# Patient Record
Sex: Male | Born: 1940
Health system: Southern US, Community
[De-identification: ages and names within clinical notes are randomized; demographics above are authoritative.]

## PROBLEM LIST (undated history)

## (undated) DIAGNOSIS — R011 Cardiac murmur, unspecified: Secondary | ICD-10-CM

## (undated) DIAGNOSIS — I509 Heart failure, unspecified: Secondary | ICD-10-CM

## (undated) DIAGNOSIS — F419 Anxiety disorder, unspecified: Secondary | ICD-10-CM

## (undated) DIAGNOSIS — F32A Depression, unspecified: Secondary | ICD-10-CM

## (undated) DIAGNOSIS — Z8673 Personal history of transient ischemic attack (TIA), and cerebral infarction without residual deficits: Secondary | ICD-10-CM

## (undated) DIAGNOSIS — I480 Paroxysmal atrial fibrillation: Secondary | ICD-10-CM

## (undated) DIAGNOSIS — J449 Chronic obstructive pulmonary disease, unspecified: Secondary | ICD-10-CM

## (undated) DIAGNOSIS — E039 Hypothyroidism, unspecified: Secondary | ICD-10-CM

## (undated) DIAGNOSIS — M1990 Unspecified osteoarthritis, unspecified site: Secondary | ICD-10-CM

## (undated) DIAGNOSIS — N189 Chronic kidney disease, unspecified: Secondary | ICD-10-CM

## (undated) DIAGNOSIS — I2510 Atherosclerotic heart disease of native coronary artery without angina pectoris: Secondary | ICD-10-CM

## (undated) DIAGNOSIS — G4733 Obstructive sleep apnea (adult) (pediatric): Secondary | ICD-10-CM

## (undated) DIAGNOSIS — F329 Major depressive disorder, single episode, unspecified: Secondary | ICD-10-CM

## (undated) HISTORY — DX: Unspecified osteoarthritis, unspecified site: M19.90

## (undated) HISTORY — PX: APPENDECTOMY: SHX54

## (undated) HISTORY — DX: Depression, unspecified: F32.A

## (undated) HISTORY — DX: Chronic kidney disease, unspecified: N18.9

## (undated) HISTORY — PX: CATARACT EXTRACTION: SUR2

## (undated) HISTORY — DX: Anxiety disorder, unspecified: F41.9

## (undated) HISTORY — DX: Cardiac murmur, unspecified: R01.1

## (undated) HISTORY — PX: BUNIONECTOMY: SHX129

## (undated) SURGERY — RIGHT/LEFT HEART CATH AND CORONARY ANGIOGRAPHY
Anesthesia: Moderate Sedation | Laterality: Bilateral

---
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
## 1976-01-12 HISTORY — PX: VASECTOMY: SHX75

## 2003-01-12 DIAGNOSIS — I2510 Atherosclerotic heart disease of native coronary artery without angina pectoris: Secondary | ICD-10-CM

## 2003-01-12 HISTORY — DX: Atherosclerotic heart disease of native coronary artery without angina pectoris: I25.10

## 2003-01-12 HISTORY — PX: CORONARY ARTERY BYPASS GRAFT: SHX141

## 2004-07-06 HISTORY — PX: COLONOSCOPY: SHX174

## 2005-08-03 LAB — PULMONARY FUNCTION TEST

## 2011-09-07 HISTORY — PX: TOTAL HIP ARTHROPLASTY: SHX124

## 2011-11-23 HISTORY — PX: TOTAL HIP ARTHROPLASTY: SHX124

## 2015-10-10 DIAGNOSIS — T8859XA Other complications of anesthesia, initial encounter: Secondary | ICD-10-CM | POA: Insufficient documentation

## 2015-10-10 DIAGNOSIS — K219 Gastro-esophageal reflux disease without esophagitis: Secondary | ICD-10-CM | POA: Insufficient documentation

## 2015-10-10 DIAGNOSIS — E039 Hypothyroidism, unspecified: Secondary | ICD-10-CM | POA: Insufficient documentation

## 2015-10-10 DIAGNOSIS — I25119 Atherosclerotic heart disease of native coronary artery with unspecified angina pectoris: Secondary | ICD-10-CM | POA: Insufficient documentation

## 2015-10-22 DIAGNOSIS — M205X1 Other deformities of toe(s) (acquired), right foot: Secondary | ICD-10-CM | POA: Insufficient documentation

## 2016-06-30 DIAGNOSIS — R9439 Abnormal result of other cardiovascular function study: Secondary | ICD-10-CM | POA: Insufficient documentation

## 2016-10-28 DIAGNOSIS — Z23 Encounter for immunization: Secondary | ICD-10-CM | POA: Diagnosis not present

## 2016-11-12 DIAGNOSIS — I2510 Atherosclerotic heart disease of native coronary artery without angina pectoris: Secondary | ICD-10-CM | POA: Insufficient documentation

## 2017-09-17 DIAGNOSIS — Z23 Encounter for immunization: Secondary | ICD-10-CM | POA: Diagnosis not present

## 2017-11-25 DIAGNOSIS — M170 Bilateral primary osteoarthritis of knee: Secondary | ICD-10-CM | POA: Insufficient documentation

## 2018-02-15 DIAGNOSIS — I5020 Unspecified systolic (congestive) heart failure: Secondary | ICD-10-CM | POA: Insufficient documentation

## 2018-02-15 DIAGNOSIS — Z96651 Presence of right artificial knee joint: Secondary | ICD-10-CM | POA: Insufficient documentation

## 2018-02-15 DIAGNOSIS — I10 Essential (primary) hypertension: Secondary | ICD-10-CM | POA: Insufficient documentation

## 2018-02-15 HISTORY — PX: TOTAL KNEE ARTHROPLASTY: SHX125

## 2018-06-07 ENCOUNTER — Encounter: Payer: Self-pay | Admitting: Family Medicine

## 2018-06-07 ENCOUNTER — Telehealth: Payer: Self-pay | Admitting: Cardiovascular Disease

## 2018-06-07 ENCOUNTER — Ambulatory Visit (INDEPENDENT_AMBULATORY_CARE_PROVIDER_SITE_OTHER): Payer: Medicare Other | Admitting: Family Medicine

## 2018-06-07 ENCOUNTER — Other Ambulatory Visit: Payer: Self-pay

## 2018-06-07 VITALS — BP 105/63 | HR 71 | Temp 98.3°F | Resp 16 | Ht 68.0 in | Wt 247.0 lb

## 2018-06-07 DIAGNOSIS — F419 Anxiety disorder, unspecified: Secondary | ICD-10-CM

## 2018-06-07 DIAGNOSIS — I10 Essential (primary) hypertension: Secondary | ICD-10-CM

## 2018-06-07 DIAGNOSIS — I25810 Atherosclerosis of coronary artery bypass graft(s) without angina pectoris: Secondary | ICD-10-CM | POA: Diagnosis not present

## 2018-06-07 DIAGNOSIS — R7303 Prediabetes: Secondary | ICD-10-CM

## 2018-06-07 DIAGNOSIS — E039 Hypothyroidism, unspecified: Secondary | ICD-10-CM | POA: Diagnosis not present

## 2018-06-07 DIAGNOSIS — H35073 Retinal telangiectasis, bilateral: Secondary | ICD-10-CM | POA: Diagnosis not present

## 2018-06-07 DIAGNOSIS — E782 Mixed hyperlipidemia: Secondary | ICD-10-CM | POA: Diagnosis not present

## 2018-06-07 DIAGNOSIS — I6930 Unspecified sequelae of cerebral infarction: Secondary | ICD-10-CM | POA: Diagnosis not present

## 2018-06-07 MED ORDER — SPIRONOLACTONE 25 MG PO TABS: 12.5000 mg | ORAL_TABLET | Freq: Every day | ORAL | 1 refills | Status: DC

## 2018-06-07 MED ORDER — SERTRALINE HCL 100 MG PO TABS: 150.0000 mg | ORAL_TABLET | Freq: Every day | ORAL | 1 refills | Status: DC

## 2018-06-07 MED ORDER — LEVOTHYROXINE SODIUM 112 MCG PO TABS: 112.0000 ug | ORAL_TABLET | Freq: Every day | ORAL | 1 refills | Status: DC

## 2018-06-07 MED ORDER — FUROSEMIDE 80 MG PO TABS: 160.0000 mg | ORAL_TABLET | Freq: Every day | ORAL | 1 refills | Status: DC

## 2018-06-07 MED ORDER — ATENOLOL 50 MG PO TABS: 75.0000 mg | ORAL_TABLET | Freq: Every day | ORAL | 1 refills | Status: DC

## 2018-06-07 MED ORDER — ATORVASTATIN CALCIUM 80 MG PO TABS: 80.0000 mg | ORAL_TABLET | Freq: Every day | ORAL | 1 refills | Status: DC

## 2018-06-07 MED ORDER — ISOSORBIDE MONONITRATE ER 30 MG PO TB24: 30.0000 mg | ORAL_TABLET | Freq: Every day | ORAL | 1 refills | Status: DC

## 2018-06-07 MED ORDER — ASPIRIN-DIPYRIDAMOLE ER 25-200 MG PO CP12: 1.0000 | ORAL_CAPSULE | Freq: Two times a day (BID) | ORAL | 1 refills | Status: DC

## 2018-06-07 MED ORDER — LORAZEPAM 0.5 MG PO TABS: 0.5000 mg | ORAL_TABLET | Freq: Every day | ORAL | 0 refills | Status: DC | PRN

## 2018-06-07 NOTE — Progress Notes (Signed)
Subjective:    Patient ID: Byrant Valent, male    DOB: 12/07/1940, 78 y.o.   MRN: [id number]  Kevin Space is a 78 y.o. male presenting on 06/07/2018 for Establish Care (history of stroke); Coronary Artery Disease; and Hyperlipidemia  Moved from [place] about 2 weeks ago, drove down here. They moved to [place] due to moving closer to their daughter, in [place]. They moved due to the winter weather in [place].  Accompanied by his wife, [name], who provides additional medical history.  HPI   History of CVA (Lacunar Stroke) Reports he had sinus / ear related issue about 1 month ago with double vision (after trying to clean ears out and flushing ears), in April 2020, in [place] he went to Urgent Care, and had sent to [facility] ED, he had CT Head that showed stroke, and ultimately diagnosed with lacunar stroke and multiple prior other strokes on imaging.  - Prior to CVA recently  He was taking ASA  daily and Atorvastatin  - Now he was discharged to SNF rehab, and on Aggrenox and discharged with this - He has some residual weakness on left side, some worsening at end of day, less word searching and finding, gradually improving - Requesting Aggrenox medication, discharged with 2 weeks worth of medication  CAD w/ CABG x 4 / Hyperlipidemia History of CAD, with MI in 03/2003, and he was treated and ultimately required CABG with complication of sternum. He has had x 2 stents placed since then, last 2 years ago - for chest pains and CAD blockage, had build up and blockage in artery after CABG. He was on ASA and Plavix together in past, within past few years due to stent, his cardiologist took him off Plavix in past after a year. And it was not restarted. - Needs referral to Cardiologist to establish locally  Retina Teleangiectasia Request referral to Retina Specialist for further management, was receiving injection therapy before. In past has affected his vision has since improved on treatment.   Hypothyroidism Chronic history hypothyroidism. No new concerns. Taking Levothyroxine daily, needs refill.  Centrilobular Emphysema COPD Reports chronic issue with some dyspnea. Not on inhaler maintenance therapy. On diuretics currently.  Arthritis - multiple joints including knees, prior surgery, not focus of visit today.  Anxiety/Depression Chronic history of mood disorder and anxiety with irritability. - Previously on BDZ lorazepam PRN sleep, more recently at hospital was given PRN dosing for anxiety with some changes in his health and he was discharged on short course Lorazepam only 10 tablets, now due for refill has helped manage his anxiety - Taking Sertraline  daily regularly for relief of mood/anxiety   Depression screen [facility] 2/9 06/07/2018  Decreased Interest 1  Down, Depressed, Hopeless 1  PHQ - 2 Score 2  Altered sleeping 1  Tired, decreased energy 1  Change in appetite 0  Feeling bad or failure about yourself  1  Trouble concentrating 0  Moving slowly or fidgety/restless 1  Suicidal thoughts 0  PHQ-9 Score 6  Difficult doing work/chores Somewhat difficult    Past Medical History:  Diagnosis Date  . Anxiety   . Arthritis   . Chronic kidney disease   . Depression   . Heart murmur   . Hx of blood clots   . Stroke (HCC)   . Thyroid disease    Past Surgical History:  Procedure Laterality Date  . APPENDECTOMY    . bi lateral hip replacement    . BUNIONECTOMY    .  CATARACT EXTRACTION     Right eye  . CORONARY ARTERY BYPASS GRAFT    . REPLACEMENT TOTAL KNEE     Social History   Socioeconomic History  . Marital status: Married    Spouse name: Not on file  . Number of children: Not on file  . Years of education: Not on file  . Highest education level: Not on file  Occupational History  . Not on file  Social Needs  . Financial resource strain: Not on file  . Food insecurity:    Worry: Not on file    Inability: Not on file  . Transportation  needs:    Medical: Not on file    Non-medical: Not on file  Tobacco Use  . Smoking status: Never Smoker  . Smokeless tobacco: Never Used  Substance and Sexual Activity  . Alcohol use: Yes    Comment: past  . Drug use: Never  . Sexual activity: Not on file  Lifestyle  . Physical activity:    Days per week: Not on file    Minutes per session: Not on file  . Stress: Not on file  Relationships  . Social connections:    Talks on phone: Not on file    Gets together: Not on file    Attends religious service: Not on file    Active member of club or organization: Not on file    Attends meetings of clubs or organizations: Not on file    Relationship status: Not on file  . Intimate partner violence:    Fear of current or ex partner: Not on file    Emotionally abused: Not on file    Physically abused: Not on file    Forced sexual activity: Not on file  Other Topics Concern  . Not on file  Social History Narrative  . Not on file   History reviewed. No pertinent family history. No current outpatient medications on file prior to visit.   No current facility-administered medications on file prior to visit.     Review of Systems Per HPI unless specifically indicated above      Objective:    BP 105/63   Pulse 71   Temp 98.3 F (36.8 C) (Oral)   Resp 16   Ht 5\' 8"  (1.727 m)   Wt 247 lb (112 kg)   SpO2 97%   BMI 37.56 kg/m   Wt Readings from Last 3 Encounters:  06/07/18 247 lb (112 kg)    Physical Exam Vitals signs and nursing note reviewed.  Constitutional:      General: He is not in acute distress.    Appearance: He is well-developed. He is not diaphoretic.     Comments: Well-appearing, comfortable, cooperative, obese  HENT:     Head: Normocephalic and atraumatic.  Eyes:     General:        Right eye: No discharge.        Left eye: No discharge.     Conjunctiva/sclera: Conjunctivae normal.  Neck:     Comments: No carotid bruit Cardiovascular:     Rate and  Rhythm: Normal rate and regular rhythm.     Pulses: Normal pulses.     Heart sounds: Normal heart sounds. No murmur.  Pulmonary:     Effort: Pulmonary effort is normal.     Breath sounds: Normal breath sounds. No wheezing, rhonchi or rales.  Skin:    General: Skin is warm and dry.     Findings: No  erythema or rash.  Neurological:     Mental Status: He is alert and oriented to person, place, and time.     Cranial Nerves: No cranial nerve deficit.     Sensory: No sensory deficit.  Psychiatric:        Behavior: Behavior normal.     Comments: Well groomed, good eye contact, normal speech and thoughts    No results found for this or any previous visit.    Assessment & Plan:   Problem List Items Addressed This Visit    History of cerebrovascular accident (CVA) with residual deficit - Primary   Relevant Medications  Currently improving, after SNF rehab and hospitalization Some mild residual muscle weakness and dysequilibrium Occasional word finding but improved now  Referral to [facility] for post-stroke management, lacunar stroke and history of prior CVA identified on outside imaging in [place] 04/2018, [place] [facility]. He had advanced CAD cardiovascular health, was on ASA 81mg  prior to stroke, hospital discharged him on Aggrenox still on this med. He was on ASA + Plavix in past due to CAD and Stent placement few year ago but was taken off Plavix after 1 year. Requesting assistance with post-stroke anti-platelet management and if need future therapy or management for residual symptoms.    dipyridamole-aspirin (AGGRENOX) 200-25 MG 12hr capsule   Other Relevant Orders   Ambulatory referral to Cardiology   Ambulatory referral to Neurology    Other Visit Diagnoses    Anxiety     Improved control Continue Lorazepam for now short term, to avoid sudden discontinue, has used PRN chronically advised we can discuss by next apt frequency of use and may adjust accordingly - Refill  Sertraline    Relevant Medications   LORazepam (ATIVAN) 0.5 MG tablet   sertraline (ZOLOFT) 100 MG tablet   Essential hypertension       Relevant Medications   atorvastatin (LIPITOR) 80 MG tablet   atenolol (TENORMIN) 50 MG tablet   spironolactone (ALDACTONE) 25 MG tablet   furosemide (LASIX) 80 MG tablet   isosorbide mononitrate (IMDUR) 30 MG 24 hr tablet   Other Relevant Orders   Ambulatory referral to Cardiology   Coronary artery disease involving coronary bypass graft of native heart without angina pectoris       Relevant Medications  Referral to [facility] for establish care, moved from [place] 04/2018, had CVA at that time, was only on aspirin 81mg , has CAD s/p CABG x 4 in [place] 2005, and recent stent placement x 2 in past 2-3 years, was on ASA + Plavix for 1 year, requesting further advice on Anti platelet medication and long term management of cardiovascular disease       atenolol (TENORMIN) 50 MG tablet   spironolactone (ALDACTONE) 25 MG tablet   furosemide (LASIX) 80 MG tablet   isosorbide mononitrate (IMDUR) 30 MG 24 hr tablet   Other Relevant Orders   Ambulatory referral to Cardiology   Mixed hyperlipidemia       Relevant Medications   atorvastatin (LIPITOR) 80 MG tablet   atenolol (TENORMIN) 50 MG tablet   spironolactone (ALDACTONE) 25 MG tablet   furosemide (LASIX) 80 MG tablet   isosorbide mononitrate (IMDUR) 30 MG 24 hr tablet   Other Relevant Orders   Ambulatory referral to Cardiology   Pre-diabetes     Due for future labs monitor A1c trend, previously very well controlled reported < 6    Acquired hypothyroidism       Relevant Medications  atenolol (TENORMIN) 50 MG tablet   levothyroxine (SYNTHROID) 112 MCG tablet   Retinal telangiectasia of both eyes       Relevant Medications   atorvastatin (LIPITOR) 80 MG tablet   atenolol (TENORMIN) 50 MG tablet   spironolactone (ALDACTONE) 25 MG tablet   furosemide (LASIX) 80 MG tablet   isosorbide mononitrate  (IMDUR) 30 MG 24 hr tablet   Other Relevant Orders   Ambulatory referral to Ophthalmology       Meds ordered this encounter  Medications  . dipyridamole-aspirin (AGGRENOX) 200-25 MG 12hr capsule    Sig: Take 1 capsule by mouth 2 (two) times daily.    Dispense:  60 capsule    Refill:  1  . LORazepam (ATIVAN) 0.5 MG tablet    Sig: Take 1 tablet (0.5 mg total) by mouth daily as needed for anxiety or sleep.    Dispense:  30 tablet    Refill:  0  . sertraline (ZOLOFT) 100 MG tablet    Sig: Take 1.5 tablets (150 mg total) by mouth daily.    Dispense:  45 tablet    Refill:  1  . atorvastatin (LIPITOR) 80 MG tablet    Sig: Take 1 tablet (80 mg total) by mouth daily.    Dispense:  30 tablet    Refill:  1  . atenolol (TENORMIN) 50 MG tablet    Sig: Take 1.5 tablets (75 mg total) by mouth daily.    Dispense:  45 tablet    Refill:  1  . spironolactone (ALDACTONE) 25 MG tablet    Sig: Take 0.5 tablets (12.5 mg total) by mouth daily.    Dispense:  15 tablet    Refill:  1  . furosemide (LASIX) 80 MG tablet    Sig: Take 2 tablets (160 mg total) by mouth daily.    Dispense:  60 tablet    Refill:  1  . levothyroxine (SYNTHROID) 112 MCG tablet    Sig: Take 1 tablet (112 mcg total) by mouth daily before breakfast.    Dispense:  30 tablet    Refill:  1  . isosorbide mononitrate (IMDUR) 30 MG 24 hr tablet    Sig: Take 1 tablet (30 mg total) by mouth daily.    Dispense:  30 tablet    Refill:  1    Follow up plan: Return in about 6 weeks (around 07/19/2018) for CAD, CVA, Anxiety.  Saralyn Pilar, DO [facility] 06/07/2018, 10:38 AM

## 2018-06-07 NOTE — Patient Instructions (Addendum)
Thank you for coming to the office today.  Ordered all medicines to local [facility]. In [facility] - then we can send them to [facility] if you need at any point, talk with pharmacy  Referrals sent to specialist today Cardiology, [facility] and also Neurology for stroke.   Please schedule a Follow-up Appointment to: Return in about 6 weeks (around 07/19/2018) for CAD, CVA, Anxiety.  If you have any other questions or concerns, please feel free to call the office or send a message through MyChart. You may also schedule an earlier appointment if necessary.  Additionally, you may be receiving a survey about your experience at our office within a few days to 1 week by e-mail or mail. We value your feedback.  Saralyn Pilar, DO [facility], [place]

## 2018-06-07 NOTE — Telephone Encounter (Signed)
Virtual Visit Pre-Appointment Phone Call  "(Name), I am calling you today to discuss your upcoming appointment. We are currently trying to limit exposure to the virus that causes COVID-19 by seeing patients at home rather than in the office."  1. "What is the BEST phone number to call the day of the visit?" - include this in appointment notes  2. Do you have or have access to (through a family member/friend) a smartphone with video capability that we can use for your visit?" a. If yes - list this number in appt notes as cell (if different from BEST phone #) and list the appointment type as a VIDEO visit in appointment notes b. If no - list the appointment type as a PHONE visit in appointment notes  3. Confirm consent - "In the setting of the current Covid19 crisis, you are scheduled for a (phone or video) visit with your provider on (date) at (time).  Just as we do with many in-office visits, in order for you to participate in this visit, we must obtain consent.  If you'd like, I can send this to your mychart (if signed up) or email for you to review.  Otherwise, I can obtain your verbal consent now.  All virtual visits are billed to your insurance company just like a normal visit would be.  By agreeing to a virtual visit, we'd like you to understand that the technology does not allow for your provider to perform an examination, and thus may limit your provider's ability to fully assess your condition. If your provider identifies any concerns that need to be evaluated in person, we will make arrangements to do so.  Finally, though the technology is pretty good, we cannot assure that it will always work on either your or our end, and in the setting of a video visit, we may have to convert it to a phone-only visit.  In either situation, we cannot ensure that we have a secure connection.  Are you willing to proceed?" STAFF: Did the patient verbally acknowledge consent to telehealth visit? Document  YES/NO here: yes  4. Advise patient to be prepared - "Two hours prior to your appointment, go ahead and check your blood pressure, pulse, oxygen saturation, and your weight (if you have the equipment to check those) and write them all down. When your visit starts, your provider will ask you for this information. If you have an Apple Watch or Kardia device, please plan to have heart rate information ready on the day of your appointment. Please have a pen and paper handy nearby the day of the visit as well."  5. Give patient instructions for MyChart download to smartphone OR [email or web address] as below if video visit (depending on what platform provider is using)  6. Inform patient they will receive a phone call 15 minutes prior to their appointment time (may be from unknown caller ID) so they should be prepared to answer    TELEPHONE CALL NOTE  Luke Mckee has been deemed a candidate for a follow-up tele-health visit to limit community exposure during the Covid-19 pandemic. I spoke with the patient via phone to ensure availability of phone/video source, confirm preferred email & phone number, and discuss instructions and expectations.  I reminded Luke CallerRandall Mckee to be prepared with any vital sign and/or heart rhythm information that could potentially be obtained via home monitoring, at the time of his visit. I reminded Luke Mckee to expect a phone call prior to his visit.  Joline Maxcy 06/07/2018 2:21 PM   INSTRUCTIONS FOR DOWNLOADING THE MYCHART APP TO SMARTPHONE  - The patient must first make sure to have activated MyChart and know their login information - If Apple, go to [employer] and type in MyChart in the search bar and download the app. If Android, ask patient to go to [employer] and type in [place] in the search bar and download the app. The app is free but as with any other app downloads, their phone may require them to verify saved payment information or Apple/Android  password.  - The patient will need to then log into the app with their MyChart username and password, and select [facility] as their healthcare provider to link the account. When it is time for your visit, go to the MyChart app, find appointments, and click Begin Video Visit. Be sure to Select Allow for your device to access the Microphone and Camera for your visit. You will then be connected, and your provider will be with you shortly.  **If they have any issues connecting, or need assistance please contact MyChart service desk [phone])**  **If using a computer, in order to ensure the best quality for their visit they will need to use either of the following Internet Browsers: [employer], or Google Chrome**  IF USING DOXIMITY or DOXY.ME - The patient will receive a link just prior to their visit by text.     FULL LENGTH CONSENT FOR TELE-HEALTH VISIT   I hereby voluntarily request, consent and authorize [facility] and its employed or contracted physicians, physician assistants, nurse practitioners or other licensed health care professionals (the Practitioner), to provide me with telemedicine health care services (the Services") as deemed necessary by the treating Practitioner. I acknowledge and consent to receive the Services by the Practitioner via telemedicine. I understand that the telemedicine visit will involve communicating with the Practitioner through live audiovisual communication technology and the disclosure of certain medical information by electronic transmission. I acknowledge that I have been given the opportunity to request an in-person assessment or other available alternative prior to the telemedicine visit and am voluntarily participating in the telemedicine visit.  I understand that I have the right to withhold or withdraw my consent to the use of telemedicine in the course of my care at any time, without affecting my right to future care or treatment,  and that the Practitioner or I may terminate the telemedicine visit at any time. I understand that I have the right to inspect all information obtained and/or recorded in the course of the telemedicine visit and may receive copies of available information for a reasonable fee.  I understand that some of the potential risks of receiving the Services via telemedicine include:   Delay or interruption in medical evaluation due to technological equipment failure or disruption;  Information transmitted may not be sufficient (e.g. poor resolution of images) to allow for appropriate medical decision making by the Practitioner; and/or   In rare instances, security protocols could fail, causing a breach of personal health information.  Furthermore, I acknowledge that it is my responsibility to provide information about my medical history, conditions and care that is complete and accurate to the best of my ability. I acknowledge that Practitioner's advice, recommendations, and/or decision may be based on factors not within their control, such as incomplete or inaccurate data provided by me or distortions of diagnostic images or specimens that may result from electronic transmissions. I understand that the  practice of medicine is not an Chief Strategy Officer and that Practitioner makes no warranties or guarantees regarding treatment outcomes. I acknowledge that I will receive a copy of this consent concurrently upon execution via email to the email address I last provided but may also request a printed copy by calling the office of [facility].    I understand that my insurance will be billed for this visit.   I have read or had this consent read to me.  I understand the contents of this consent, which adequately explains the benefits and risks of the Services being provided via telemedicine.   I have been provided ample opportunity to ask questions regarding this consent and the Services and have had my questions  answered to my satisfaction.  I give my informed consent for the services to be provided through the use of telemedicine in my medical care  By participating in this telemedicine visit I agree to the above.

## 2018-06-07 NOTE — Telephone Encounter (Signed)
Fax request for records see appt notes

## 2018-06-07 NOTE — Telephone Encounter (Signed)
Phone Number: [phone] FAX [phone]

## 2018-06-16 ENCOUNTER — Telehealth (INDEPENDENT_AMBULATORY_CARE_PROVIDER_SITE_OTHER): Payer: Medicare Other | Admitting: Cardiovascular Disease

## 2018-06-16 ENCOUNTER — Other Ambulatory Visit: Payer: Self-pay

## 2018-06-16 DIAGNOSIS — I6930 Unspecified sequelae of cerebral infarction: Secondary | ICD-10-CM

## 2018-06-16 DIAGNOSIS — I25118 Atherosclerotic heart disease of native coronary artery with other forms of angina pectoris: Secondary | ICD-10-CM | POA: Insufficient documentation

## 2018-06-16 DIAGNOSIS — Z951 Presence of aortocoronary bypass graft: Secondary | ICD-10-CM

## 2018-06-16 DIAGNOSIS — E782 Mixed hyperlipidemia: Secondary | ICD-10-CM | POA: Diagnosis not present

## 2018-06-16 DIAGNOSIS — I25810 Atherosclerosis of coronary artery bypass graft(s) without angina pectoris: Secondary | ICD-10-CM

## 2018-06-16 DIAGNOSIS — I10 Essential (primary) hypertension: Secondary | ICD-10-CM | POA: Insufficient documentation

## 2018-06-16 MED ORDER — ATENOLOL 50 MG PO TABS: 50.0000 mg | ORAL_TABLET | Freq: Every evening | ORAL | 3 refills | Status: DC

## 2018-06-16 MED ORDER — CLOPIDOGREL BISULFATE 75 MG PO TABS: 75.0000 mg | ORAL_TABLET | Freq: Every day | ORAL | 3 refills | Status: DC

## 2018-06-16 NOTE — Patient Instructions (Addendum)
Send instructions in the mail  Medication Instructions:  Restart plavix 75 mg daily Stay on aggrenox twice a day  Decrease the atenolol down to 50 mg daily evening Stay on imdur 30 in the AM  Consider splitting the lasix to 8 AM and 2 pm  Monitor blood pressure , also standing  If you need a refill on your cardiac medications before your next appointment, please call your pharmacy.    Lab work: No new labs needed   If you have labs (blood work) drawn today and your tests are completely normal, you will receive your results only by: Marland Kitchen MyChart Message (if you have MyChart) OR . A paper copy in the mail If you have any lab test that is abnormal or we need to change your treatment, we will call you to review the results.   Testing/Procedures: No new testing needed   Follow-Up: At [facility], you and your health needs are our priority.  As part of our continuing mission to provide you with exceptional heart care, we have created designated Provider Care Teams.  These Care Teams include your primary Cardiologist (physician) and Advanced Practice Providers (APPs -  Physician Assistants and Nurse Practitioners) who all work together to provide you with the care you need, when you need it.  . You will need a follow up appointment in 3 months .   Please call our office 2 months in advance to schedule this appointment.    . Providers on your designated Care Team:   . Nicolasa Ducking, NP . Eula Listen, PA-C . Marisue Ivan, PA-C  Any Other Special Instructions Will Be Listed Below (If Applicable).  For educational health videos Log in to : [email or web address] Or : [email or web address], password : [email or web address]

## 2018-06-16 NOTE — Progress Notes (Signed)
Virtual Visit via Video Note   This visit type was conducted due to national recommendations for restrictions regarding the COVID-19 Pandemic (e.g. social distancing) in an effort to limit this patient's exposure and mitigate transmission in our community.  Due to his co-morbid illnesses, this patient is at least at moderate risk for complications without adequate follow up.  This format is felt to be most appropriate for this patient at this time.  All issues noted in this document were discussed and addressed.  A limited physical exam was performed with this format.  Please refer to the patient's chart for his consent to telehealth for [facility].   I connected with  Luke Mckee on 06/19/18 by a video enabled telemedicine application and verified that I am speaking with the correct person using two identifiers. I discussed the limitations of evaluation and management by telemedicine. The patient expressed understanding and agreed to proceed.   Evaluation Performed:  Follow-up visit  Date:  06/19/2018   ID:  Luke, Mckee [birth year] Mckee, MRN [id number]  Patient Location:  [address] [place] [address]   Provider location:   [facility], [place] office  PCP:  [name], DO  Cardiologist:  Fonnie Mu   Chief Complaint: Recovering from stroke, residual deficits, weakness, depression   History of Present Illness:    Luke Mckee is a 78 y.o. male who presents via audio/video conferencing for a telehealth visit today.   The patient does not symptoms concerning for COVID-19 infection (fever, chills, cough, or new SHORTNESS OF BREATH).   Patient has a past medical history of CAD s/p CABG x 4 in [place] 2005, and recent stent placement x 2 in past 2-3 years, Anxiety HTN Recent lacunar stroke  prior CVA identified on outside imaging in [place] 04/2018,  Hospital 5 days, rehab 6 days Right side affected,  Who presents to establish care in the  [place] office for his coronary artery disease  He reports that he was on ASA  prior to stroke,  hospital discharged him on Aggrenox still on this med.  He was on ASA + Plavix in past due to CAD and Stent placement few year ago but was taken off Plavix after 1 year  Some pedal edema, symptoms seem to come and go Goes out to eat at restaurants, times frequently  No chest pain:  "I don't feel good", dizzy Having trouble with strength in his legs, feels he could have depression, Exacerbated by the stroke  Sleeping ok,  Sleeping too much  Lost 5 to 6 pounds through the stroke in recovery  Low blood pressure at home 100 systolic Takes all of his medications in the morning   Prior CV studies:   The following studies were reviewed today:  Echo: last month in [place]  Cath possibly 2 years ago, stent x 1 Prior cath 3 years , stent x 1   Past Medical History:  Diagnosis Date  . Anxiety   . Arthritis   . Chronic kidney disease   . Depression   . Heart murmur   . Hx of blood clots   . Stroke (HCC)   . Thyroid disease    Past Surgical History:  Procedure Laterality Date  . APPENDECTOMY    . bi lateral hip replacement    . BUNIONECTOMY    . CATARACT EXTRACTION     Right eye  . CORONARY ARTERY BYPASS GRAFT    . REPLACEMENT TOTAL  KNEE       No outpatient medications have been marked as taking for the 06/16/18 encounter (Telemedicine) with Antonieta IbaGollan, Sweta Halseth J, MD.     Allergies:   Ace inhibitors   Social History   Tobacco Use  . Smoking status: Never Smoker  . Smokeless tobacco: Never Used  Substance Use Topics  . Alcohol use: Yes    Comment: past  . Drug use: Never     Current Outpatient Medications on File Prior to Visit  Medication Sig Dispense Refill  . atorvastatin (LIPITOR) 80 MG tablet Take 1 tablet (80 mg total) by mouth daily. 30 tablet 1  . dipyridamole-aspirin (AGGRENOX) 200-25 MG 12hr capsule Take 1 capsule by mouth 2 (two) times daily. 60 capsule  1  . furosemide (LASIX) 80 MG tablet Take 2 tablets (160 mg total) by mouth daily. 60 tablet 1  . isosorbide mononitrate (IMDUR) 30 MG 24 hr tablet Take 1 tablet (30 mg total) by mouth daily. 30 tablet 1  . levothyroxine (SYNTHROID) 112 MCG tablet Take 1 tablet (112 mcg total) by mouth daily before breakfast. 30 tablet 1  . LORazepam (ATIVAN) 0.5 MG tablet Take 1 tablet (0.5 mg total) by mouth daily as needed for anxiety or sleep. 30 tablet 0  . sertraline (ZOLOFT) 100 MG tablet Take 1.5 tablets (150 mg total) by mouth daily. 45 tablet 1  . spironolactone (ALDACTONE) 25 MG tablet Take 0.5 tablets (12.5 mg total) by mouth daily. 15 tablet 1   No current facility-administered medications on file prior to visit.      Family Hx: The patient's family history is not on file.  ROS:   Please see the history of present illness.    Review of Systems  Constitutional: Negative.   HENT: Negative.   Respiratory: Negative.   Cardiovascular: Negative.   Gastrointestinal: Negative.   Musculoskeletal: Negative.        Leg weakness  Neurological: Negative.   Psychiatric/Behavioral: Negative.   All other systems reviewed and are negative.     Labs/Other Tests and Data Reviewed:    Recent Labs: No results found for requested labs within last 8760 hours.   Recent Lipid Panel No results found for: CHOL, TRIG, HDL, CHOLHDL, LDLCALC, LDLDIRECT  Wt Readings from Last 3 Encounters:  06/07/18 247 lb (112 kg)     Exam:    Vital Signs: Vital signs may also be detailed in the HPI There were no vitals taken for this visit.  Wt Readings from Last 3 Encounters:  06/07/18 247 lb (112 kg)   Temp Readings from Last 3 Encounters:  06/07/18 98.3 F (36.8 C) (Oral)   BP Readings from Last 3 Encounters:  06/07/18 105/63   Pulse Readings from Last 3 Encounters:  06/07/18 71    72 pulse 101/66, 106/64 O2 92 %  Well nourished, well developed male in no acute distress. Constitutional:  oriented to  person, place, and time. No distress.  Head: Normocephalic and atraumatic.  Eyes:  no discharge. No scleral icterus.  Neck: Normal range of motion. Neck supple.  Pulmonary/Chest: No audible wheezing, no distress, appears comfortable Musculoskeletal: Normal range of motion.  no  tenderness or deformity.  Neurological:   Coordination normal. Full exam not performed Skin:  No rash Psychiatric:  normal mood and affect. behavior is normal. Thought content normal.    ASSESSMENT & PLAN:    Atherosclerosis of native coronary artery of native heart with stable angina pectoris (HCC) Denies having anginal symptoms Recommend  he add Plavix, high risk vasculopath given CABG Stay on Aggrenox given recent stroke  Hx of CABG Plan as above Records have been requested  Mixed hyperlipidemia No recent lab work, numbers have been requested Goal LDL less than 70  Benign essential HTN Blood pressure running low Recommended he decrease dose of atenolol Move atenolol to the evening stay on Imdur 30 in the morning Monitor blood pressure and if it continues to run low call the office -Suggested he check orthostatics as well  History of cerebrovascular accident (CVA) with residual deficit Long discussion concerning recent stroke and recovery By his account still weak, having depression symptoms  Essential hypertension - Medications have been refilled  Coronary artery disease involving coronary bypass graft of native heart without angina pectoris -  Currently with no symptoms of angina.  No further testing at this time   COVID-19 Education: The signs and symptoms of COVID-19 were discussed with the patient and how to seek care for testing (follow up with PCP or arrange E-visit).  The importance of social distancing was discussed today.  Patient Risk:   After full review of this patients clinical status, I feel that they are at least moderate risk at this time.  Time:   Today, I have spent 60  minutes with the patient with telehealth technology discussing the cardiac and medical problems/diagnoses detailed above   10 min spent reviewing the chart prior to patient visit today   Medication Adjustments/Labs and Tests Ordered: Current medicines are reviewed at length with the patient today.  Concerns regarding medicines are outlined above.   Tests Ordered: No tests ordered   Medication Changes: No changes made   Disposition: Follow-up in 3 months   Signed, Julien Nordmann, MD  06/19/2018 1:54 PM    [facility] [address], [place], [place] [address]

## 2018-06-19 ENCOUNTER — Telehealth: Payer: Self-pay | Admitting: Cardiovascular Disease

## 2018-06-19 NOTE — Telephone Encounter (Signed)
Please call regarding physical therapy referral.

## 2018-06-19 NOTE — Telephone Encounter (Signed)
Spoke with patient and wife.  Patient states in visit with Dr Rockey Situ Friday he mentioned he would like to see patient's strength increased and mentioned PT as a possibility. Advised them to contact PCP regarding this as they typically will be the ones to set it up. They were appreciative.

## 2018-07-04 ENCOUNTER — Other Ambulatory Visit: Payer: Self-pay | Admitting: Family Medicine

## 2018-07-04 DIAGNOSIS — E039 Hypothyroidism, unspecified: Secondary | ICD-10-CM

## 2018-07-04 DIAGNOSIS — E782 Mixed hyperlipidemia: Secondary | ICD-10-CM

## 2018-07-04 DIAGNOSIS — I25810 Atherosclerosis of coronary artery bypass graft(s) without angina pectoris: Secondary | ICD-10-CM

## 2018-07-04 DIAGNOSIS — I10 Essential (primary) hypertension: Secondary | ICD-10-CM

## 2018-07-11 DIAGNOSIS — H35071 Retinal telangiectasis, right eye: Secondary | ICD-10-CM | POA: Diagnosis not present

## 2018-07-20 ENCOUNTER — Other Ambulatory Visit: Payer: Self-pay

## 2018-07-20 ENCOUNTER — Encounter: Payer: Self-pay | Admitting: Family Medicine

## 2018-07-20 ENCOUNTER — Ambulatory Visit (INDEPENDENT_AMBULATORY_CARE_PROVIDER_SITE_OTHER): Payer: Medicare Other | Admitting: Family Medicine

## 2018-07-20 VITALS — BP 106/64 | HR 80 | Temp 98.3°F | Ht 68.0 in | Wt 246.0 lb

## 2018-07-20 DIAGNOSIS — G8194 Hemiplegia, unspecified affecting left nondominant side: Secondary | ICD-10-CM | POA: Diagnosis not present

## 2018-07-20 DIAGNOSIS — I6930 Unspecified sequelae of cerebral infarction: Secondary | ICD-10-CM

## 2018-07-20 DIAGNOSIS — I679 Cerebrovascular disease, unspecified: Secondary | ICD-10-CM

## 2018-07-20 DIAGNOSIS — F419 Anxiety disorder, unspecified: Secondary | ICD-10-CM | POA: Diagnosis not present

## 2018-07-20 DIAGNOSIS — I10 Essential (primary) hypertension: Secondary | ICD-10-CM | POA: Diagnosis not present

## 2018-07-20 DIAGNOSIS — I25118 Atherosclerotic heart disease of native coronary artery with other forms of angina pectoris: Secondary | ICD-10-CM | POA: Diagnosis not present

## 2018-07-20 NOTE — Progress Notes (Signed)
Subjective:    Patient ID: Luke Mckee, male    DOB: 1940-03-07, 78 y.o.   MRN: [id number]  Luke Mckee is a 78 y.o. male presenting on 07/20/2018 for Anxiety and CVA (x 2 mths ago)  Accompanied by wife Arbie Cookey, who provides additional history.  HPI   History of CVA (Lacunar Stroke) / Left Sided residual deficit Hemiplegia (upper > lower ext) Previously reviewed background, new CVA stroke 04/2018 Interval updates, Awaiting initial Neurology apt locally [facility] to be seen 08/28/18. Has seen Cardiology to establish, see below, regarding CVA he is now on added Plavix as well as Aggrenox as well. - Continues Statin, see below - Today update is he is gradually improving his L sided weakness, upper ext seems slightly weaker than lower by his report. He has some residual weakness on left side, some worsening at end of day, less word searching and finding, gradually improving still but he is frustrated with progress at times, he is doing home therapy program on his own but he declined PT previously - Cardiology recommended physical therapy, he has declined Now outdoor mowing yard, gradual improvement  CAD w/ CABG x 4 / Hyperlipidemia Background History of CAD, with MI in 03/2003, and he was treated and ultimately required CABG with complication of sternum. He has had x 2 stents placed since then, last 2 years ago Last visit recently established locally with Cardiology telemedicine 06/16/18 Dr Rockey Situ - Added Plavix. Continue Aggrenox. Separated dosage from Atenolol in AM now with Isosorbide in PM - Take Furosemide 80mg  twice a day instead of 2 in morning at once, doing better - No significant concerns. Denies any edema, chest pain or pressure.  Retina Teleangiectasia Seen by ophthalmology already, [facility], did not get a shot treatment this time, he is pleased.  Anxiety Doing well on SSRI and PRN lorazepam, infrequent use. Denies depression.  Depression screen [facility] 2/9 07/20/2018 06/07/2018   Decreased Interest 0 1  Down, Depressed, Hopeless 0 1  PHQ - 2 Score 0 2  Altered sleeping 0 1  Tired, decreased energy 2 1  Change in appetite 0 0  Feeling bad or failure about yourself  0 1  Trouble concentrating 0 0  Moving slowly or fidgety/restless 2 1  Suicidal thoughts 0 0  PHQ-9 Score 4 6  Difficult doing work/chores Not difficult at all Somewhat difficult    Social History   Tobacco Use  . Smoking status: Never Smoker  . Smokeless tobacco: Never Used  Substance Use Topics  . Alcohol use: Not Currently    Comment: past  . Drug use: Never    Review of Systems Per HPI unless specifically indicated above     Objective:    BP 106/64 (BP Location: Right Arm, Patient Position: Sitting, Cuff Size: Normal)   Pulse 80   Temp 98.3 F (36.8 C) (Oral)   Ht 5\' 8"  (1.727 m)   Wt 246 lb (111.6 kg)   BMI 37.40 kg/m   Wt Readings from Last 3 Encounters:  07/20/18 246 lb (111.6 kg)  06/07/18 247 lb (112 kg)    Physical Exam Vitals signs and nursing note reviewed.  Constitutional:      General: He is not in acute distress.    Appearance: He is well-developed. He is not diaphoretic.     Comments: Well-appearing, comfortable, cooperative, obese  HENT:     Head: Normocephalic and atraumatic.  Eyes:     General:  Right eye: No discharge.        Left eye: No discharge.     Conjunctiva/sclera: Conjunctivae normal.  Neck:     Musculoskeletal: Normal range of motion and neck supple.     Thyroid: No thyromegaly.  Cardiovascular:     Rate and Rhythm: Normal rate and regular rhythm.     Heart sounds: Normal heart sounds. No murmur.  Pulmonary:     Effort: Pulmonary effort is normal. No respiratory distress.     Breath sounds: Normal breath sounds. No wheezing or rales.  Musculoskeletal: Normal range of motion.     Comments: Lower Extremity R normal L has normal strength 5/5 flex ext rotation hip and knee, without obvious strength deficit at this time, some  hesitancy on changing position seated to standing with proximal muscles  Upper extremity R normal strength ROM L mild reduced 4/5 strength grip and upper extremity shoulder and ROM mostly intact  Lymphadenopathy:     Cervical: No cervical adenopathy.  Skin:    General: Skin is warm and dry.     Findings: No erythema or rash.  Neurological:     Mental Status: He is alert and oriented to person, place, and time.  Psychiatric:        Behavior: Behavior normal.     Comments: Well groomed, good eye contact, normal speech and thoughts    No results found for this or any previous visit.    Assessment & Plan:   Problem List Items Addressed This Visit    Anxiety    Stable, seems to be relatively controlled - frustrated with progress w/ CVA On SSRI On PRN lorazepam, not needing refill      Atherosclerosis of native coronary artery of native heart with stable angina pectoris (HCC)    Stable CAD s/p CABG stents Without anginal symptoms On med management now, [facility] On plavix in addition to the aggrenox now      Benign essential HTN    Stable controlled On current regimen, spaced apart dosing due to low bp Followed by Cardiology      Hemiplegia of left nondominant side due to cerebrovascular disease (HCC)    Secondary to CVA See A&P      History of cerebrovascular accident (CVA) with residual deficit - Primary    Gradual improvement with L hemiplegia Upper Ext > Lower Ext, additional assoc symptoms word finding also slowly improved S/p lacunar infarct CVA 04/2018 Previously ASA monotherapy, now on aggrenox and then newly added Plavix back from Cardiology  Awaiting [facility] Neuro apt previously referred in 05/2018, next scheduled for 08/28/18 Offered ambulatory PT referral or vestibular, he declines at this time.  Will refer to CCM Nurse CM / CSW services to follow-up on this issue, may warrant PT either HH vs Outpatient if he decides         No  orders of the defined types were placed in this encounter.  Orders Placed This Encounter  Procedures  . Ambulatory referral to Chronic Care Management Services    Referral Priority:   Routine    Referral Type:   Consultation    Referral Reason:   Care Coordination    Number of Visits Requested:   1     Follow up plan: Return in about 3 months (around 10/20/2018) for 3 month follow-up CVA weakness updates.   Saralyn PilarAlexander Phelix Fudala, DO [facility] 07/20/2018, 11:22 AM

## 2018-07-20 NOTE — Assessment & Plan Note (Addendum)
Stable controlled On current regimen, spaced apart dosing due to low bp Followed by Cardiology

## 2018-07-20 NOTE — Assessment & Plan Note (Signed)
Stable, seems to be relatively controlled - frustrated with progress w/ CVA On SSRI On PRN lorazepam, not needing refill

## 2018-07-20 NOTE — Assessment & Plan Note (Signed)
Secondary to CVA See A&P

## 2018-07-20 NOTE — Assessment & Plan Note (Signed)
Gradual improvement with L hemiplegia Upper Ext > Lower Ext, additional assoc symptoms word finding also slowly improved S/p lacunar infarct CVA 04/2018 Previously ASA monotherapy, now on aggrenox and then newly added Plavix back from Cardiology  [facility] Neuro apt previously referred in 05/2018, next scheduled for 08/28/18 Offered ambulatory PT referral or vestibular, he declines at this time.  Will refer to CCM Nurse CM / [place] services to follow-up on this issue, may warrant PT either HH vs Outpatient if he decides

## 2018-07-20 NOTE — Patient Instructions (Addendum)
Thank you for coming to the office today.  We will refer you to our Chronic Care Management program by phone. Merlene Morse (Nurse) and Production assistant, radio (social work) they can check on you to determine if any needs with your stroke and other health condition, if you feel like you need some sort of therapy you can let them know and they can relay that to me in future.  Stay tuned for update on Neurology  Continue with current medication changes from Dr Rockey Situ   Please schedule a Follow-up Appointment to: Return in about 3 months (around 10/20/2018) for 3 month follow-up CVA weakness updates.  If you have any other questions or concerns, please feel free to call the office or send a message through [place]. You may also schedule an earlier appointment if necessary.  Additionally, you may be receiving a survey about your experience at our office within a few days to 1 week by e-mail or mail. We value your feedback.  Nobie Putnam, DO [place]

## 2018-07-20 NOTE — Assessment & Plan Note (Signed)
Stable CAD s/p CABG stents Without anginal symptoms On med management now, established w/ local [facility] Cardiology On plavix in addition to the aggrenox now

## 2018-07-23 ENCOUNTER — Encounter (HOSPITAL_COMMUNITY): Payer: Self-pay

## 2018-07-23 ENCOUNTER — Other Ambulatory Visit: Payer: Self-pay

## 2018-07-23 ENCOUNTER — Inpatient Hospital Stay (HOSPITAL_COMMUNITY)
Admission: EM | Admit: 2018-07-23 | Discharge: 2018-07-25 | DRG: 041 | Disposition: A | Discharge status: Home Health | Payer: Medicare Other | Attending: Internal Medicine | Admitting: Internal Medicine

## 2018-07-23 ENCOUNTER — Observation Stay (HOSPITAL_COMMUNITY): Payer: Medicare Other

## 2018-07-23 ENCOUNTER — Emergency Department (HOSPITAL_COMMUNITY): Payer: Medicare Other

## 2018-07-23 DIAGNOSIS — I63412 Cerebral infarction due to embolism of left middle cerebral artery: Secondary | ICD-10-CM | POA: Diagnosis not present

## 2018-07-23 DIAGNOSIS — M1990 Unspecified osteoarthritis, unspecified site: Secondary | ICD-10-CM | POA: Diagnosis present

## 2018-07-23 DIAGNOSIS — Z79890 Hormone replacement therapy (postmenopausal): Secondary | ICD-10-CM

## 2018-07-23 DIAGNOSIS — R29810 Facial weakness: Secondary | ICD-10-CM | POA: Diagnosis not present

## 2018-07-23 DIAGNOSIS — J449 Chronic obstructive pulmonary disease, unspecified: Secondary | ICD-10-CM | POA: Diagnosis present

## 2018-07-23 DIAGNOSIS — E782 Mixed hyperlipidemia: Secondary | ICD-10-CM | POA: Diagnosis present

## 2018-07-23 DIAGNOSIS — I5032 Chronic diastolic (congestive) heart failure: Secondary | ICD-10-CM | POA: Diagnosis present

## 2018-07-23 DIAGNOSIS — R29898 Other symptoms and signs involving the musculoskeletal system: Secondary | ICD-10-CM

## 2018-07-23 DIAGNOSIS — Z91048 Other nonmedicinal substance allergy status: Secondary | ICD-10-CM

## 2018-07-23 DIAGNOSIS — I10 Essential (primary) hypertension: Secondary | ICD-10-CM

## 2018-07-23 DIAGNOSIS — I639 Cerebral infarction, unspecified: Secondary | ICD-10-CM | POA: Diagnosis not present

## 2018-07-23 DIAGNOSIS — E039 Hypothyroidism, unspecified: Secondary | ICD-10-CM | POA: Diagnosis present

## 2018-07-23 DIAGNOSIS — G9389 Other specified disorders of brain: Secondary | ICD-10-CM | POA: Diagnosis present

## 2018-07-23 DIAGNOSIS — I6930 Unspecified sequelae of cerebral infarction: Secondary | ICD-10-CM

## 2018-07-23 DIAGNOSIS — Z9841 Cataract extraction status, right eye: Secondary | ICD-10-CM

## 2018-07-23 DIAGNOSIS — I69354 Hemiplegia and hemiparesis following cerebral infarction affecting left non-dominant side: Secondary | ICD-10-CM | POA: Diagnosis not present

## 2018-07-23 DIAGNOSIS — Z888 Allergy status to other drugs, medicaments and biological substances status: Secondary | ICD-10-CM

## 2018-07-23 DIAGNOSIS — R471 Dysarthria and anarthria: Secondary | ICD-10-CM | POA: Diagnosis present

## 2018-07-23 DIAGNOSIS — I272 Pulmonary hypertension, unspecified: Secondary | ICD-10-CM | POA: Diagnosis present

## 2018-07-23 DIAGNOSIS — Z1159 Encounter for screening for other viral diseases: Secondary | ICD-10-CM | POA: Diagnosis not present

## 2018-07-23 DIAGNOSIS — Z7902 Long term (current) use of antithrombotics/antiplatelets: Secondary | ICD-10-CM

## 2018-07-23 DIAGNOSIS — G8321 Monoplegia of upper limb affecting right dominant side: Secondary | ICD-10-CM | POA: Diagnosis present

## 2018-07-23 DIAGNOSIS — Z955 Presence of coronary angioplasty implant and graft: Secondary | ICD-10-CM

## 2018-07-23 DIAGNOSIS — G4733 Obstructive sleep apnea (adult) (pediatric): Secondary | ICD-10-CM | POA: Diagnosis present

## 2018-07-23 DIAGNOSIS — I130 Hypertensive heart and chronic kidney disease with heart failure and stage 1 through stage 4 chronic kidney disease, or unspecified chronic kidney disease: Secondary | ICD-10-CM | POA: Diagnosis present

## 2018-07-23 DIAGNOSIS — Z818 Family history of other mental and behavioral disorders: Secondary | ICD-10-CM

## 2018-07-23 DIAGNOSIS — Z951 Presence of aortocoronary bypass graft: Secondary | ICD-10-CM

## 2018-07-23 DIAGNOSIS — E876 Hypokalemia: Secondary | ICD-10-CM | POA: Diagnosis not present

## 2018-07-23 DIAGNOSIS — F329 Major depressive disorder, single episode, unspecified: Secondary | ICD-10-CM | POA: Diagnosis present

## 2018-07-23 DIAGNOSIS — F419 Anxiety disorder, unspecified: Secondary | ICD-10-CM | POA: Diagnosis present

## 2018-07-23 DIAGNOSIS — M47812 Spondylosis without myelopathy or radiculopathy, cervical region: Secondary | ICD-10-CM | POA: Diagnosis present

## 2018-07-23 DIAGNOSIS — E669 Obesity, unspecified: Secondary | ICD-10-CM | POA: Diagnosis present

## 2018-07-23 DIAGNOSIS — G459 Transient cerebral ischemic attack, unspecified: Secondary | ICD-10-CM | POA: Insufficient documentation

## 2018-07-23 DIAGNOSIS — I480 Paroxysmal atrial fibrillation: Secondary | ICD-10-CM | POA: Diagnosis present

## 2018-07-23 DIAGNOSIS — R29703 NIHSS score 3: Secondary | ICD-10-CM | POA: Diagnosis present

## 2018-07-23 DIAGNOSIS — R4781 Slurred speech: Secondary | ICD-10-CM | POA: Diagnosis not present

## 2018-07-23 DIAGNOSIS — R7303 Prediabetes: Secondary | ICD-10-CM | POA: Diagnosis present

## 2018-07-23 DIAGNOSIS — I959 Hypotension, unspecified: Secondary | ICD-10-CM | POA: Diagnosis not present

## 2018-07-23 DIAGNOSIS — Z79899 Other long term (current) drug therapy: Secondary | ICD-10-CM

## 2018-07-23 DIAGNOSIS — Z6837 Body mass index (BMI) 37.0-37.9, adult: Secondary | ICD-10-CM

## 2018-07-23 DIAGNOSIS — I2510 Atherosclerotic heart disease of native coronary artery without angina pectoris: Secondary | ICD-10-CM | POA: Diagnosis present

## 2018-07-23 DIAGNOSIS — R739 Hyperglycemia, unspecified: Secondary | ICD-10-CM | POA: Diagnosis present

## 2018-07-23 DIAGNOSIS — Z96643 Presence of artificial hip joint, bilateral: Secondary | ICD-10-CM | POA: Diagnosis present

## 2018-07-23 DIAGNOSIS — Z86718 Personal history of other venous thrombosis and embolism: Secondary | ICD-10-CM

## 2018-07-23 DIAGNOSIS — N189 Chronic kidney disease, unspecified: Secondary | ICD-10-CM | POA: Diagnosis present

## 2018-07-23 DIAGNOSIS — I6782 Cerebral ischemia: Secondary | ICD-10-CM | POA: Diagnosis not present

## 2018-07-23 DIAGNOSIS — I4891 Unspecified atrial fibrillation: Secondary | ICD-10-CM

## 2018-07-23 DIAGNOSIS — R0902 Hypoxemia: Secondary | ICD-10-CM | POA: Diagnosis not present

## 2018-07-23 DIAGNOSIS — Z20828 Contact with and (suspected) exposure to other viral communicable diseases: Secondary | ICD-10-CM | POA: Diagnosis not present

## 2018-07-23 HISTORY — DX: Paroxysmal atrial fibrillation: I48.0

## 2018-07-23 HISTORY — DX: Heart failure, unspecified: I50.9

## 2018-07-23 HISTORY — DX: Hypothyroidism, unspecified: E03.9

## 2018-07-23 HISTORY — DX: Obstructive sleep apnea (adult) (pediatric): G47.33

## 2018-07-23 HISTORY — DX: Personal history of transient ischemic attack (TIA), and cerebral infarction without residual deficits: Z86.73

## 2018-07-23 HISTORY — DX: Chronic obstructive pulmonary disease, unspecified: J44.9

## 2018-07-23 LAB — COMPREHENSIVE METABOLIC PANEL
ALT: 20 U/L (ref 0–44)
AST: 21 U/L (ref 15–41)
Albumin: 3.8 g/dL (ref 3.5–5.0)
Alkaline Phosphatase: 54 U/L (ref 38–126)
Anion gap: 11 (ref 5–15)
BUN: 16 mg/dL (ref 8–23)
CO2: 26 mmol/L (ref 22–32)
Calcium: 8.9 mg/dL (ref 8.9–10.3)
Chloride: 103 mmol/L (ref 98–111)
Creatinine, Ser: 0.98 mg/dL (ref 0.61–1.24)
GFR calc Af Amer: >60 mL/min (ref >60)
GFR calc non Af Amer: >60 mL/min (ref >60)
Glucose, Bld: 123 mg/dL — ABNORMAL HIGH (ref 70–99)
Potassium: 3.2 mmol/L — ABNORMAL LOW (ref 3.5–5.1)
Sodium: 140 mmol/L (ref 135–145)
Total Bilirubin: 0.7 mg/dL (ref 0.3–1.2)
Total Protein: 7.3 g/dL (ref 6.5–8.1)

## 2018-07-23 LAB — DIFFERENTIAL
Abs Immature Granulocytes: 0.02 10*3/uL (ref 0.00–0.07)
Basophils Absolute: 0.1 10*3/uL (ref 0.0–0.1)
Basophils Relative: 1 %
Eosinophils Absolute: 0.3 10*3/uL (ref 0.0–0.5)
Eosinophils Relative: 4 %
Immature Granulocytes: 0 %
Lymphocytes Relative: 30 %
Lymphs Abs: 2.1 10*3/uL (ref 0.7–4.0)
Monocytes Absolute: 0.7 10*3/uL (ref 0.1–1.0)
Monocytes Relative: 10 %
Neutro Abs: 3.8 10*3/uL (ref 1.7–7.7)
Neutrophils Relative %: 55 %

## 2018-07-23 LAB — CBC
HCT: 40.3 % (ref 39.0–52.0)
Hemoglobin: 13.4 g/dL (ref 13.0–17.0)
MCH: 32.1 pg (ref 26.0–34.0)
MCHC: 33.3 g/dL (ref 30.0–36.0)
MCV: 96.4 fL (ref 80.0–100.0)
Platelets: 210 10*3/uL (ref 150–400)
RBC: 4.18 MIL/uL — ABNORMAL LOW (ref 4.22–5.81)
RDW: 14.3 % (ref 11.5–15.5)
WBC: 7 10*3/uL (ref 4.0–10.5)
nRBC: 0 % (ref 0.0–0.2)

## 2018-07-23 LAB — I-STAT CHEM 8, ED
BUN: 17 mg/dL (ref 8–23)
Calcium, Ion: 1.09 mmol/L — ABNORMAL LOW (ref 1.15–1.40)
Chloride: 102 mmol/L (ref 98–111)
Creatinine, Ser: 0.9 mg/dL (ref 0.61–1.24)
Glucose, Bld: 122 mg/dL — ABNORMAL HIGH (ref 70–99)
HCT: 41 % (ref 39.0–52.0)
Hemoglobin: 13.9 g/dL (ref 13.0–17.0)
Potassium: 3.2 mmol/L — ABNORMAL LOW (ref 3.5–5.1)
Sodium: 141 mmol/L (ref 135–145)
TCO2: 29 mmol/L (ref 22–32)

## 2018-07-23 LAB — PROTIME-INR
INR: 1.2 (ref 0.8–1.2)
Prothrombin Time: 14.6 seconds (ref 11.4–15.2)

## 2018-07-23 LAB — SARS CORONAVIRUS 2 BY RT PCR (HOSPITAL ORDER, PERFORMED IN ~~LOC~~ HOSPITAL LAB): SARS Coronavirus 2: NEGATIVE

## 2018-07-23 LAB — APTT: aPTT: 28 seconds (ref 24–36)

## 2018-07-23 MED ORDER — ATORVASTATIN CALCIUM 80 MG PO TABS
80.0000 mg | ORAL_TABLET | Freq: Every day | ORAL | Status: DC
  Administered 2018-07-24 – 2018-07-25 (×2): 80 mg via ORAL
  Filled 2018-07-23 (×2): qty 1

## 2018-07-23 MED ORDER — LORAZEPAM 0.5 MG PO TABS
0.5000 mg | ORAL_TABLET | Freq: Every day | ORAL | Status: DC | PRN
  Administered 2018-07-23 – 2018-07-24 (×2): 0.5 mg via ORAL
  Filled 2018-07-23 (×2): qty 1

## 2018-07-23 MED ORDER — FUROSEMIDE 80 MG PO TABS
80.0000 mg | ORAL_TABLET | Freq: Two times a day (BID) | ORAL | Status: DC
  Administered 2018-07-24: 11:00:00 80 mg via ORAL
  Filled 2018-07-23: qty 1

## 2018-07-23 MED ORDER — IOHEXOL 350 MG/ML SOLN
75.0000 mL | Freq: Once | INTRAVENOUS | Status: AC | PRN
  Administered 2018-07-23: 19:00:00 75 mL via INTRAVENOUS

## 2018-07-23 MED ORDER — ACETAMINOPHEN 650 MG RE SUPP: 650.0000 mg | RECTAL | Status: DC | PRN

## 2018-07-23 MED ORDER — STROKE: EARLY STAGES OF RECOVERY BOOK
Freq: Once | Status: AC
  Administered 2018-07-23: 23:00:00
  Filled 2018-07-23: qty 1

## 2018-07-23 MED ORDER — ASPIRIN EC 81 MG PO TBEC
81.0000 mg | DELAYED_RELEASE_TABLET | Freq: Every day | ORAL | Status: DC
  Administered 2018-07-24 – 2018-07-25 (×2): 81 mg via ORAL
  Filled 2018-07-23 (×2): qty 1

## 2018-07-23 MED ORDER — SERTRALINE HCL 50 MG PO TABS
150.0000 mg | ORAL_TABLET | Freq: Every day | ORAL | Status: DC
  Administered 2018-07-24 – 2018-07-25 (×2): 150 mg via ORAL
  Filled 2018-07-23 (×2): qty 1

## 2018-07-23 MED ORDER — POTASSIUM CHLORIDE CRYS ER 20 MEQ PO TBCR
20.0000 meq | EXTENDED_RELEASE_TABLET | Freq: Once | ORAL | Status: AC
  Administered 2018-07-23: 20 meq via ORAL
  Filled 2018-07-23: qty 1

## 2018-07-23 MED ORDER — SPIRONOLACTONE 12.5 MG HALF TABLET
12.5000 mg | ORAL_TABLET | Freq: Every day | ORAL | Status: DC
  Administered 2018-07-24 – 2018-07-25 (×2): 12.5 mg via ORAL
  Filled 2018-07-23 (×2): qty 1

## 2018-07-23 MED ORDER — SODIUM CHLORIDE 0.9% FLUSH
3.0000 mL | Freq: Once | INTRAVENOUS | Status: AC
Start: 2018-07-23 — End: 2018-07-23
  Administered 2018-07-23: 19:00:00 3 mL via INTRAVENOUS

## 2018-07-23 MED ORDER — ISOSORBIDE MONONITRATE ER 30 MG PO TB24
30.0000 mg | ORAL_TABLET | Freq: Every day | ORAL | Status: DC
  Administered 2018-07-24 – 2018-07-25 (×2): 30 mg via ORAL
  Filled 2018-07-23 (×2): qty 1

## 2018-07-23 MED ORDER — CLOPIDOGREL BISULFATE 75 MG PO TABS
75.0000 mg | ORAL_TABLET | Freq: Every day | ORAL | Status: DC
  Administered 2018-07-24 – 2018-07-25 (×2): 75 mg via ORAL
  Filled 2018-07-23 (×2): qty 1

## 2018-07-23 MED ORDER — ACETAMINOPHEN 160 MG/5ML PO SOLN: 650.0000 mg | ORAL | Status: DC | PRN

## 2018-07-23 MED ORDER — ACETAMINOPHEN 325 MG PO TABS: 650.0000 mg | ORAL_TABLET | ORAL | Status: DC | PRN

## 2018-07-23 MED ORDER — LEVOTHYROXINE SODIUM 112 MCG PO TABS
112.0000 ug | ORAL_TABLET | Freq: Every day | ORAL | Status: DC
  Administered 2018-07-24 – 2018-07-25 (×2): 112 ug via ORAL
  Filled 2018-07-23 (×2): qty 1

## 2018-07-23 MED ORDER — ATENOLOL 25 MG PO TABS
50.0000 mg | ORAL_TABLET | Freq: Every evening | ORAL | Status: DC
  Administered 2018-07-23: 23:00:00 50 mg via ORAL
  Filled 2018-07-23: qty 2

## 2018-07-23 NOTE — Consult Note (Addendum)
Neurology Consultation  Reason for Consult: Code Stroke  Referring Physician: Dr. Maryan Rued  CC: right side facial drool and slurred speech  History is obtained from:patient and [facility]  HPI: Luke Mckee is a 78 y.o. male who presents with right side facial droop and slurred speech  Luke Mckee is a 78 yo male with past medical history of HTN, CVA in April 2020 with left side deficits per chart review, CAD s/p CABG  And  thyroid disease on Plavix and aggrenox who presents to [facility] ED via EMS for acute right facial droop and slurred speech.  Patient endorses LSN 1700 when symptoms started. EMS summoned and activated Code Stroke.  Patient denies any residual deficits from prior stroke Patient denies SOB, CP, headache, recent illness or fevers.  Per EMS symptoms have been improving enroute to ED.  IV TPA not given due to recent CVA and mild improving symptoms. CT head no acute abnormality, CTA head/neck no LVO  Patient to be admitted to hospital for stroke workup    LKW: 1700 tpa given?: no, recent CVA and mild symptoms that are improving  Premorbid modified Rankin scale (mRS):  1-No significant post stroke disability and can perform usual duties with stroke symptoms  ROS: A 14 point ROS was performed and is negative except as noted in the HPI.   Past Medical History:  Diagnosis Date  . Anxiety   . Arthritis   . Chronic kidney disease   . Depression   . Heart murmur   . Hx of blood clots   . Thyroid disease     Essential (primary) hypertension and Obesity   No family history on file.  Social History:   reports that he has never smoked. He has never used smokeless tobacco. He reports previous alcohol use. He reports that he does not use drugs.  Medications  Current Facility-Administered Medications:  .  sodium chloride flush (NS) 0.9 % injection 3 mL, 3 mL, Intravenous, Once, Plunkett, Whitney, MD  Current Outpatient Medications:  .  acetaminophen (TYLENOL 8 HOUR) 650 MG CR  tablet, Take 1,300 mg by mouth once., Disp: , Rfl:  .  atenolol (TENORMIN) 50 MG tablet, Take 1 tablet (50 mg total) by mouth every evening., Disp: 90 tablet, Rfl: 3 .  atorvastatin (LIPITOR) 80 MG tablet, TAKE 1 TABLET BY MOUTH DAILY, Disp: 90 tablet, Rfl: 1 .  clopidogrel (PLAVIX) 75 MG tablet, Take 1 tablet (75 mg total) by mouth daily., Disp: 90 tablet, Rfl: 3 .  dipyridamole-aspirin (AGGRENOX) 200-25 MG 12hr capsule, Take 1 capsule by mouth 2 (two) times daily., Disp: 60 capsule, Rfl: 1 .  furosemide (LASIX) 80 MG tablet, Take 1 tablet (80 mg total) by mouth 2 (two) times daily., Disp: 180 tablet, Rfl: 1 .  isosorbide mononitrate (IMDUR) 30 MG 24 hr tablet, TAKE 1 TABLET BY MOUTH DAILY, Disp: 90 tablet, Rfl: 1 .  levothyroxine (SYNTHROID) 112 MCG tablet, TAKE 1 TABLET BY MOUTH DAILY( BEFORE BREAKFAST), Disp: 90 tablet, Rfl: 1 .  LORazepam (ATIVAN) 0.5 MG tablet, Take 1 tablet (0.5 mg total) by mouth daily as needed for anxiety or sleep., Disp: 30 tablet, Rfl: 0 .  sertraline (ZOLOFT) 100 MG tablet, Take 1.5 tablets (150 mg total) by mouth daily., Disp: 45 tablet, Rfl: 1 .  spironolactone (ALDACTONE) 25 MG tablet, TAKE 1/2 TABLET BY MOUTH DAILY, Disp: 45 tablet, Rfl: 1   Exam: Current vital signs: Vitals:   07/23/18 1923 07/23/18 1930  BP:  109/63  Pulse:  70  Resp:  (!) 24  Temp:    SpO2: 94% 96%    Vital signs in last 24 hours:    GENERAL: Awake, alert in NAD HEENT: - Normocephalic and atraumatic, dry mm LUNGS - Clear to auscultation bilaterally with no wheezes CV - S1S2 RRR, no m/r/g, equal pulses bilaterally. ABDOMEN - Soft, nontender, nondistended with normoactive BS Ext: warm, well perfused, intact peripheral pulses, no edema PSYCH: normal affect and mood  NEURO:  Mental Status: AA&Ox3  Language: speech is dysarthric Naming, repetition, fluency, and comprehension intact. Cranial Nerves: PERRL 3 mm/brisk. EOMI, visual fields full, right facial asymmetry, facial  sensation intact, hearing intact, tongue/uvula/soft palate midline, normal sternocleidomastoid and trapezius muscle strength. No evidence of tongue atrophy or fibrillations Motor: Left arm and leg 4/5, right arm and leg 4/5 Tone: is normal and bulk is normal Sensation- Intact to light touch bilaterally Coordination: FTN ataxia on right arm , no ataxia in BLE. Gait- deferred  1a Level of Conscious.: 0 1b LOC Questions: 0 1c LOC Commands: 0  2 Best Gaze: 0 3 Visual: 0 4 Facial Palsy: 1 5a Motor Arm - left: 0 5b Motor Arm - Right: 1 6a Motor Leg - Left: 0 6b Motor Leg - Right: 0  7 Limb Ataxia: 1 8 Sensory: 0 9 Best Language: 0 10 Dysarthria: 1 11 Extinct. and Inatten.: 0 TOTAL: 3   Labs I have reviewed labs in epic and the results pertinent to this consultation are:  CBC    Component Value Date/Time   WBC 7.0 07/23/2018 1835   RBC 4.18 (L) 07/23/2018 1835   HGB 13.4 07/23/2018 1835   HGB 13.9 07/23/2018 1835   HCT 40.3 07/23/2018 1835   HCT 41.0 07/23/2018 1835   PLT 210 07/23/2018 1835   MCV 96.4 07/23/2018 1835   MCH 32.1 07/23/2018 1835   MCHC 33.3 07/23/2018 1835   RDW 14.3 07/23/2018 1835   LYMPHSABS 2.1 07/23/2018 1835   MONOABS 0.7 07/23/2018 1835   EOSABS 0.3 07/23/2018 1835   BASOSABS 0.1 07/23/2018 1835    CMP     Component Value Date/Time   NA 141 07/23/2018 1835   K 3.2 (L) 07/23/2018 1835   CL 102 07/23/2018 1835   GLUCOSE 122 (H) 07/23/2018 1835   BUN 17 07/23/2018 1835   CREATININE 0.90 07/23/2018 1835    Lipid Panel  No results found for: CHOL, TRIG, HDL, CHOLHDL, VLDL, LDLCALC, LDLDIRECT   Imaging I have reviewed the images obtained:  CT-scan of the brain no acute abnormality, hypodensity in the left frontal region which I suspect was his previous stroke  CTA head/NECK no LVO   Gevena Martenise Wolfe, NP  I have seen the patient and reviewed the above note.  Assessment:  Luke Mckee is a 78 yo male with recent stroke in April of this  year who presents with recurrent right-sided weakness.  I do not have access to his previous records, but he was previously on Aggrenox monotherapy and then Plavix was added by his cardiologist.  From a stroke prevention standpoint, he does not need to be on both Aggrenox and Plavix, we could do aspirin plus Plavix for 3 weeks followed by monotherapy from a purely stroke prevention standpoint.  Certainly, if he needs both Aggrenox and Plavix from a cardiological perspective then would be okay with this.  I would favor repeating his work-up to make sure that we are maximizing stroke prevention measures given 2 recent strokes.  I did discuss  thrombolytics with the patient, but given that he is within 3 weeks of his previous stroke, I do think he would be at higher risk of an intracranial hemorrhage.  I discussed this with the patient who agreed with not proceeding with thrombolytics given his relatively mild symptoms.  Recommendations: - Admit for complete stroke workup  - HgbA1c, fasting lipid panel - MRI of the brain without contrast - Frequent neuro checks and NIHSS - Echocardiogram - Prophylactic therapy-Antiplatelet med: Aspirin - 81mg  plus Plavix 75 mg daily for 3 weeks, or Aggrenox plus Plavix if needed from cardiology perspective - Risk factor modification - Telemetry monitoring -Bedside swallow screen  - Continue home atorvastatin 80 mg  - PT consult, OT consult, Speech consult - Stroke team to follow  Ritta SlotMcNeill Lakeva Hollon, MD [facility] [phone]  If 7pm- 7am, please page neurology on call as listed in AMION.

## 2018-07-23 NOTE — ED Provider Notes (Signed)
[facility] EMERGENCY DEPARTMENT Provider Note   CSN: [id number] Arrival date & time: 07/23/18  1828     History   Chief Complaint No chief complaint on file.   HPI Luke Mckee is a 78 y.o. male.     Patient is a 78 year old male with history of CKD, thyroid disease, blood clots, CABG and recent CVA in April 2020 who is presenting today as a code stroke.  Patient was his normal self all day and family noted a right-sided facial droop, slurred speech and mild right upper extremity weakness.  [name] last seen normal was 1600.  Patient does take Plavix and Aggrenox but based on the patient and his wife's report he had no residual deficits from his stroke in April which actually caused hemiplegia of the left side not the right.  Patient is denying fever, shortness of breath, headache, neck pain, chest pain or abdominal pain.  EMS reports that symptoms have slightly improved during transport.  The history is provided by the patient and the EMS personnel.    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 ([place]) 07/20/2018   Anxiety 07/20/2018   Atherosclerosis of native coronary artery of native heart with stable angina pectoris ([place]) 06/16/2018   Hx of CABG 06/16/2018   Mixed hyperlipidemia 06/16/2018   Benign essential HTN 06/16/2018   History of cerebrovascular accident (CVA) with residual deficit 06/07/2018    Past Surgical History:  Procedure Laterality Date   APPENDECTOMY     bi lateral hip replacement     BUNIONECTOMY     CATARACT EXTRACTION     Right eye   CORONARY ARTERY BYPASS GRAFT     REPLACEMENT TOTAL KNEE          Home Medications    Prior to Admission medications   Medication  Sig Start Date End Date Taking? Authorizing Provider  acetaminophen (TYLENOL 8 HOUR) 650 MG CR tablet Take 1,300 mg by mouth once.    [provider]  atenolol (TENORMIN) 50 MG tablet Take 1 tablet (50 mg total) by mouth every evening. 06/16/18   Minna Merritts, MD  atorvastatin (LIPITOR) 80 MG tablet TAKE 1 TABLET BY MOUTH DAILY 07/05/18   Olin Hauser, DO  clopidogrel (PLAVIX) 75 MG tablet Take 1 tablet (75 mg total) by mouth daily. 06/16/18   Minna Merritts, MD  dipyridamole-aspirin (AGGRENOX) 200-25 MG 12hr capsule Take 1 capsule by mouth 2 (two) times daily. 06/07/18   Karamalegos, Devonne Doughty, DO  furosemide (LASIX) 80 MG tablet Take 1 tablet (80 mg total) by mouth 2 (two) times daily. 07/05/18   Karamalegos, Devonne Doughty, DO  isosorbide mononitrate (IMDUR) 30 MG 24 hr tablet TAKE 1 TABLET BY MOUTH DAILY 07/05/18   Parks Ranger, Devonne Doughty, DO  levothyroxine (SYNTHROID) 112 MCG tablet TAKE 1 TABLET BY MOUTH DAILY( BEFORE BREAKFAST) 07/05/18   Karamalegos, Devonne Doughty, DO  LORazepam (ATIVAN) 0.5 MG tablet Take 1 tablet (0.5 mg total) by mouth daily as needed for anxiety or sleep. 06/07/18   Karamalegos, Devonne Doughty, DO  sertraline (ZOLOFT) 100 MG tablet Take 1.5 tablets (150 mg total) by mouth daily. 06/07/18  Karamalegos, Alexander J, DO  spironolactone (ALDACTONE) 25 MG tablet TAKE 1/2 TABLET BY MOUTH DAILY 07/05/18   Smitty CordsKaramalegos, Alexander J, DO    Family History No family history on file.  Social History Social History   Tobacco Use   Smoking status: Never Smoker   Smokeless tobacco: Never Used  Substance Use Topics   Alcohol use: Not Currently    Comment: past   Drug use: Never     Allergies   Ace inhibitors   Review of Systems Review of Systems  All other systems reviewed and are negative.    Physical Exam Updated Vital Signs BP (!) 131/92 (BP Location: Left Arm)    Pulse 70    Temp 98 F (36.7 C) (Oral)    Resp 16    SpO2 98%   Physical  Exam Vitals signs and nursing note reviewed.  Constitutional:      General: He is not in acute distress.    Appearance: He is well-developed.  HENT:     Head: Normocephalic and atraumatic.     Nose: Nose normal.     Mouth/Throat:     Mouth: Mucous membranes are moist.  Eyes:     General: No visual field deficit.    Conjunctiva/sclera: Conjunctivae normal.     Pupils: Pupils are equal, round, and reactive to light.  Neck:     Musculoskeletal: Normal range of motion and neck supple.  Cardiovascular:     Rate and Rhythm: Normal rate and regular rhythm.     Heart sounds: No murmur.  Pulmonary:     Effort: Pulmonary effort is normal. No respiratory distress.     Breath sounds: Normal breath sounds. No wheezing or rales.  Abdominal:     General: There is no distension.     Palpations: Abdomen is soft.     Tenderness: There is no abdominal tenderness. There is no guarding or rebound.  Musculoskeletal: Normal range of motion.        General: No tenderness.  Skin:    General: Skin is warm and dry.     Findings: No erythema or rash.  Neurological:     Mental Status: He is alert and oriented to person, place, and time.     Cranial Nerves: Facial asymmetry present.     Sensory: Sensation is intact.     Motor: Weakness present.     Coordination: Coordination is intact. Finger-Nose-Finger Test normal.     Comments: Mild slurred speech and right-sided facial droop.  No visual field cuts and normal finger-to-nose testing.  No pronator drift in the bilateral lower extremities in the left upper extremities but mild drift on the right.  No hemineglect or nystagmus.  Mild decreased strength in the right upper extremity  Psychiatric:        Behavior: Behavior normal.      ED Treatments / Results  Labs (all labs ordered are listed, but only abnormal results are displayed) Labs Reviewed  CBC - Abnormal; Notable for the following components:      Result Value   RBC 4.18 (*)    All other  components within normal limits  I-STAT CHEM 8, ED - Abnormal; Notable for the following components:   Potassium 3.2 (*)    Glucose, Bld 122 (*)    Calcium, Ion 1.09 (*)    All other components within normal limits  PROTIME-INR  APTT  DIFFERENTIAL  COMPREHENSIVE METABOLIC PANEL  CBG MONITORING, ED    EKG EKG Interpretation  Date/Time:  Sunday July 23 2018 18:58:43 EDT Ventricular Rate:  76 PR Interval:    QRS Duration: 89 QT Interval:  389 QTC Calculation: 438 R Axis:   43 Text Interpretation:  Sinus rhythm Abnormal R-wave progression, early transition Inferior infarct, old No previous tracing Confirmed by Gwyneth SproutPlunkett, Bensyn Bornemann ([id number]) on 07/23/2018 7:08:40 PM   Radiology Ct Head Code Stroke Wo Contrast  Result Date: 07/23/2018 CLINICAL DATA:  Code stroke. Encephalopathy. Right-sided facial droop and slurred speech. Last seen normal 5 p.m. EXAM: CT HEAD WITHOUT CONTRAST TECHNIQUE: Contiguous axial images were obtained from the base of the skull through the vertex without intravenous contrast. COMPARISON:  None. FINDINGS: Brain: There is no mass, hemorrhage or extra-axial collection. The size and configuration of the ventricles and extra-axial CSF spaces are normal. Areas of hypoattenuation of the deep gray nuclei and confluent periventricular white matter hypodensity, consistent with chronic small vessel disease. Old infarcts of the left basal ganglia and thalamus. Vascular: No abnormal hyperdensity of the major intracranial arteries or dural venous sinuses. No intracranial atherosclerosis. Skull: The visualized skull base, calvarium and extracranial soft tissues are normal. Sinuses/Orbits: No fluid levels or advanced mucosal thickening of the visualized paranasal sinuses. No mastoid or middle ear effusion. The orbits are normal. ASPECTS [facility]([place] Stroke Program Early CT Score) - Ganglionic level infarction (caudate, lentiform nuclei, internal capsule, insula, M1-M3 cortex): 7 -  Supraganglionic infarction (M4-M6 cortex): 3 Total score (0-10 with 10 being normal): 10 IMPRESSION: 1. No intracranial hemorrhage. 2. ASPECTS is 10. These results were communicated to Dr. Ritta SlotMcNeill Kirkpatrick at 6:47 pm on 07/23/2018 by text page via the [facility] messaging system. Electronically Signed   By: Deatra RobinsonKevin  Herman M.D.   On: 07/23/2018 18:48    Procedures Procedures (including critical care time)  Medications Ordered in ED Medications - No data to display   Initial Impression / Assessment and Plan / ED Course  I have reviewed the triage vital signs and the nursing notes.  Pertinent labs & imaging results that were available during my care of the patient were reviewed by me and considered in my medical decision making (see chart for details).       Elderly male presenting as a code stroke with symptoms of slurred speech, right-sided facial droop and mild right upper extremity weakness.  Patient with history of stroke in the past most recently in April without residual deficit.  Patient currently on Aggrenox and Plavix.  He denies headache and is otherwise well-appearing.  Stroke order set initiated.  Neurology at bedside evaluating patient.  7:06 PM Pt's CT neg for acute bleed or pathology.  Pt is on aggrenox and plavix at this time.  Given sx and recent stroke in the last 3months pt declined tPA due to significant risk of bleed due to above reasons.  He would like to persue PT.  Pt admitted for stroke work up.  Neuro evaled and agree.  They feel he only needs to be on plavix and aspirin.  Wife notified.  CBC, CMP, coag all wnl.  Final Clinical Impressions(s) / ED Diagnoses   Final diagnoses:  Cerebrovascular accident (CVA), unspecified mechanism [facility](HCC)    ED Discharge Orders    None       Gwyneth SproutPlunkett, Joenathan Sakuma, MD 07/23/18 1934

## 2018-07-23 NOTE — ED Notes (Signed)
Patient completed swallow screen w/o any coughing or choking at this time.

## 2018-07-23 NOTE — H&P (Addendum)
TRH H&P    Patient Demographics:    Luke Mckee, is a 78 y.o. male  MRN: [id number]  DOB - [birth year]  Admit Date - 07/23/2018  Referring MD/NP/PA:  Blanchie Dessert  Outpatient Primary MD for the patient is Olin Hauser, DO  Patient coming from:  home  Chief complaint-  Right arm weakness, slurred speech   HPI:    Luke Mckee  is a 78 y.o. male,  w hypothyroidism, hypertension, CAD s/p CABG(2005)/ DES stentx2 (2018), CHF(EF 55%), Pafib (in Epic), , h/o CVA with left hemiplegia , OSA, COPD, mild pulmonary hypertension,  ?CKD, Anxiety/depression apparently presents with c/o right arm weakness, numbness, right facial droop and slurred speech starting at 5pm.  Pt currently taking plavix and aggrenox.   In ED Seen by neurology, not a TPA candidate, pt declines TPA  T 98 P 69  R 25 Bp 118/71  Pox 93% on RA  CT brain Brain: There is no mass, hemorrhage or extra-axial collection. The size and configuration of the ventricles and extra-axial CSF spaces are normal. Areas of hypoattenuation of the deep gray nuclei and confluent periventricular white matter hypodensity, consistent with chronic small vessel disease. Old infarcts of the left basal ganglia and thalamus.  IMPRESSION: 1. No intracranial hemorrhage.  CTA head/ neck IMPRESSION: 1. Patent carotid and vertebral arteries. No dissection, aneurysm, or hemodynamically significant stenosis utilizing NASCET criteria. 2. Patent anterior and posterior intracranial circulation. No large vessel occlusion, aneurysm, or significant stenosis. 3. Non stenotic calcific atherosclerosis of the aorta and carotid systems. 4. Advanced spondylosis of the cervical spine with at least moderate spinal canal stenosis from C4-C7.   INR 1.2, PTT 28  Wbc 7.0, Hgb 13.4, Plt 210 Na 140, K 3.2,  Bun 16, Creatinine 0.98 Ast 21, Alt 20, Alk phos 54. T bili  0.7 Glucose 123  covid 19 pending  CXR pending   Pt right arm weakness and improved as well as slurred speech, almost back to his baseline.   Neurology consulted by ED, recommended aspirin and plavix  Pt will be admitted for TIA r/o CVA     Review of systems:    In addition to the HPI above,  No Fever-chills, No Headache, No changes with Vision or hearing, No problems swallowing food or Liquids, No Chest pain, No Cough or Shortness of Breath, No Abdominal pain, No Nausea or Vomiting, bowel movements are regular, No Blood in stool or Urine, No dysuria, No new skin rashes or bruises, No new joints pains-aches,   No recent weight gain or loss, No polyuria, polydypsia or polyphagia, No significant Mental Stressors.  All other systems reviewed and are negative.    Past History of the following :    Past Medical History:  Diagnosis Date   Anxiety    Arthritis    CAD (coronary artery disease) 2005   s/p CABG, DES   CHF (congestive heart failure) (HCC)    in EPIC care everywhere   Chronic kidney disease    COPD (chronic obstructive pulmonary disease) ([place])  in EPIC care everywhere   Depression    Heart murmur    History of stroke    Hypothyroidism    OSA (obstructive sleep apnea)    Paroxysmal A-fib ([place])    in EPIC careeverywhere      Past Surgical History:  Procedure Laterality Date   APPENDECTOMY     BUNIONECTOMY     CATARACT EXTRACTION     Right eye   COLONOSCOPY  07/06/2004   CORONARY ARTERY BYPASS GRAFT  2005   TOTAL HIP ARTHROPLASTY Right 11/23/2011   TOTAL HIP ARTHROPLASTY Left 09/07/2011   TOTAL KNEE ARTHROPLASTY Right 02/15/2018   VASECTOMY  1978      Social History:      Social History   Tobacco Use   Smoking status: Never Smoker   Smokeless tobacco: Never Used  Substance Use Topics   Alcohol use: Not Currently    Comment: past       Family History :     Family History  Problem Relation Age of  Onset   Anxiety disorder Sister        Home Medications:   Prior to Admission medications   Medication Sig Start Date End Date Taking? Authorizing Provider  acetaminophen (TYLENOL 8 HOUR) 650 MG CR tablet Take 1,300 mg by mouth once.    [provider]  atenolol (TENORMIN) 50 MG tablet Take 1 tablet (50 mg total) by mouth every evening. 06/16/18   Minna Merritts, MD  atorvastatin (LIPITOR) 80 MG tablet TAKE 1 TABLET BY MOUTH DAILY 07/05/18   Olin Hauser, DO  clopidogrel (PLAVIX) 75 MG tablet Take 1 tablet (75 mg total) by mouth daily. 06/16/18   Minna Merritts, MD  dipyridamole-aspirin (AGGRENOX) 200-25 MG 12hr capsule Take 1 capsule by mouth 2 (two) times daily. 06/07/18   Karamalegos, Devonne Doughty, DO  furosemide (LASIX) 80 MG tablet Take 1 tablet (80 mg total) by mouth 2 (two) times daily. 07/05/18   Karamalegos, Devonne Doughty, DO  isosorbide mononitrate (IMDUR) 30 MG 24 hr tablet TAKE 1 TABLET BY MOUTH DAILY 07/05/18   Parks Ranger, Devonne Doughty, DO  levothyroxine (SYNTHROID) 112 MCG tablet TAKE 1 TABLET BY MOUTH DAILY( BEFORE BREAKFAST) 07/05/18   Karamalegos, Devonne Doughty, DO  LORazepam (ATIVAN) 0.5 MG tablet Take 1 tablet (0.5 mg total) by mouth daily as needed for anxiety or sleep. 06/07/18   Karamalegos, Devonne Doughty, DO  sertraline (ZOLOFT) 100 MG tablet Take 1.5 tablets (150 mg total) by mouth daily. 06/07/18   Karamalegos, Devonne Doughty, DO  spironolactone (ALDACTONE) 25 MG tablet TAKE 1/2 TABLET BY MOUTH DAILY 07/05/18   Olin Hauser, DO     Allergies:     Allergies  Allergen Reactions   Ace Inhibitors    Crestor [Rosuvastatin Calcium] Other (See Comments)    myalgia     Physical Exam:   Vitals  Blood pressure 120/75, pulse 70, temperature 98 F (36.7 C), temperature source Oral, resp. rate 16, height '5\' 8"'$  (1.727 m), SpO2 95 %.  1.  General: axox3  2. Psychiatric: euthymic  3. Neurologic: cn2-12 intact, reflexes 2+ symmetric, diffuse  with downgoing toes bilaterally, motor 5/5 in all 4 ext, slight pronator drift with the right hand  4. HEENMT:  Anicteric, pupils 1.30m symmetric, direct, consensual, near intact,  Tongue midline Neck: no jvd, no bruit, no tm  5. Respiratory : CTAB  6. Cardiovascular : rrr s1, s2, no m/g/r  7. Gastrointestinal:  Abd: soft, nt, nd, +bs,  obese  8. Skin:  Ext: no c/c/e,  No rash  9.Musculoskeletal:  Good ROM,  Scar on right knee (TKA) No adenopathy    Data Review:    CBC Recent Labs  Lab 07/23/18 1835  WBC 7.0  HGB 13.4   13.9  HCT 40.3   41.0  PLT 210  MCV 96.4  MCH 32.1  MCHC 33.3  RDW 14.3  LYMPHSABS 2.1  MONOABS 0.7  EOSABS 0.3  BASOSABS 0.1   ------------------------------------------------------------------------------------------------------------------  Results for orders placed or performed during the hospital encounter of 07/23/18 (from the past 48 hour(s))  Protime-INR     Status: None   Collection Time: 07/23/18  6:35 PM  Result Value Ref Range   Prothrombin Time 14.6 11.4 - 15.2 seconds   INR 1.2 0.8 - 1.2    Comment: (NOTE) INR goal varies based on device and disease states. Performed at [facility], [place] [address], [place], [place] [address]   APTT     Status: None   Collection Time: 07/23/18  6:35 PM  Result Value Ref Range   aPTT 28 24 - 36 seconds    Comment: Performed at [facility] [address], [place], [place] [address]  CBC     Status: Abnormal   Collection Time: 07/23/18  6:35 PM  Result Value Ref Range   WBC 7.0 4.0 - 10.5 K/uL   RBC 4.18 (L) 4.22 - 5.81 MIL/uL   Hemoglobin 13.4 13.0 - 17.0 g/dL   HCT 40.3 39.0 - 52.0 %   MCV 96.4 80.0 - 100.0 fL   MCH 32.1 26.0 - 34.0 pg   MCHC 33.3 30.0 - 36.0 g/dL   RDW 14.3 11.5 - 15.5 %   Platelets 210 150 - 400 K/uL   nRBC 0.0 0.0 - 0.2 %    Comment: Performed at [facility], [place] [address], [place], [place] [address]  Differential     Status: None    Collection Time: 07/23/18  6:35 PM  Result Value Ref Range   Neutrophils Relative % 55 %   Neutro Abs 3.8 1.7 - 7.7 K/uL   Lymphocytes Relative 30 %   Lymphs Abs 2.1 0.7 - 4.0 K/uL   Monocytes Relative 10 %   Monocytes Absolute 0.7 0.1 - 1.0 K/uL   Eosinophils Relative 4 %   Eosinophils Absolute 0.3 0.0 - 0.5 K/uL   Basophils Relative 1 %   Basophils Absolute 0.1 0.0 - 0.1 K/uL   Immature Granulocytes 0 %   Abs Immature Granulocytes 0.02 0.00 - 0.07 K/uL    Comment: Performed at [facility], [place] [address] [place], [place] [address]  Comprehensive metabolic panel     Status: Abnormal   Collection Time: 07/23/18  6:35 PM  Result Value Ref Range   Sodium 140 135 - 145 mmol/L   Potassium 3.2 (L) 3.5 - 5.1 mmol/L   Chloride 103 98 - 111 mmol/L   CO2 26 22 - 32 mmol/L   Glucose, Bld 123 (H) 70 - 99 mg/dL   BUN 16 8 - 23 mg/dL   Creatinine, Ser 0.98 0.61 - 1.24 mg/dL   Calcium 8.9 8.9 - 10.3 mg/dL   Total Protein 7.3 6.5 - 8.1 g/dL   Albumin 3.8 3.5 - 5.0 g/dL   AST 21 15 - 41 U/L   ALT 20 0 - 44 U/L   Alkaline Phosphatase 54 38 - 126 U/L   Total Bilirubin  0.7 0.3 - 1.2 mg/dL   GFR calc non Af Amer >60 >60 mL/min   GFR calc Af Amer >60 >60 mL/min   Anion gap 11 5 - 15    Comment: Performed at [facility] [address] [place], [place] [address]  I-stat chem 8, ED     Status: Abnormal   Collection Time: 07/23/18  6:35 PM  Result Value Ref Range   Sodium 141 135 - 145 mmol/L   Potassium 3.2 (L) 3.5 - 5.1 mmol/L   Chloride 102 98 - 111 mmol/L   BUN 17 8 - 23 mg/dL   Creatinine, Ser 0.90 0.61 - 1.24 mg/dL   Glucose, Bld 122 (H) 70 - 99 mg/dL   Calcium, Ion 1.09 (L) 1.15 - 1.40 mmol/L   TCO2 29 22 - 32 mmol/L   Hemoglobin 13.9 13.0 - 17.0 g/dL   HCT 41.0 39.0 - 52.0 %    Chemistries  Recent Labs  Lab 07/23/18 1835  NA 140   141  K 3.2*   3.2*  CL 103   102  CO2 26  GLUCOSE 123*   122*  BUN 16   17  CREATININE 0.98   0.90  CALCIUM 8.9  AST 21  ALT  20  ALKPHOS 54  BILITOT 0.7   ------------------------------------------------------------------------------------------------------------------  ------------------------------------------------------------------------------------------------------------------ GFR: Estimated Creatinine Clearance: 76.5 mL/min (by C-G formula based on SCr of 0.98 mg/dL). Liver Function Tests: Recent Labs  Lab 07/23/18 1835  AST 21  ALT 20  ALKPHOS 54  BILITOT 0.7  PROT 7.3  ALBUMIN 3.8   No results for input(s): LIPASE, AMYLASE in the last 168 hours. No results for input(s): AMMONIA in the last 168 hours. Coagulation Profile: Recent Labs  Lab 07/23/18 1835  INR 1.2   Cardiac Enzymes: No results for input(s): CKTOTAL, CKMB, CKMBINDEX, TROPONINI in the last 168 hours. BNP (last 3 results) No results for input(s): PROBNP in the last 8760 hours. HbA1C: No results for input(s): HGBA1C in the last 72 hours. CBG: No results for input(s): GLUCAP in the last 168 hours. Lipid Profile: No results for input(s): CHOL, HDL, LDLCALC, TRIG, CHOLHDL, LDLDIRECT in the last 72 hours. Thyroid Function Tests: No results for input(s): TSH, T4TOTAL, FREET4, T3FREE, THYROIDAB in the last 72 hours. Anemia Panel: No results for input(s): VITAMINB12, FOLATE, FERRITIN, TIBC, IRON, RETICCTPCT in the last 72 hours.  --------------------------------------------------------------------------------------------------------------- Urine analysis: No results found for: COLORURINE, APPEARANCEUR, LABSPEC, PHURINE, GLUCOSEU, HGBUR, BILIRUBINUR, KETONESUR, PROTEINUR, UROBILINOGEN, NITRITE, LEUKOCYTESUR    Imaging Results:    Ct Angio Head W Or Wo Contrast  Result Date: 07/23/2018 CLINICAL DATA:  78 y/o M; right facial droop and slurred speech, stroke follow-up. EXAM: CT ANGIOGRAPHY HEAD AND NECK TECHNIQUE: Multidetector CT imaging of the head and neck was performed using the standard protocol during bolus administration of  intravenous contrast. Multiplanar CT image reconstructions and MIPs were obtained to evaluate the vascular anatomy. Carotid stenosis measurements (when applicable) are obtained utilizing NASCET criteria, using the distal internal carotid diameter as the denominator. CONTRAST:  75 cc Omnipaque 350 COMPARISON:  07/23/2018 CT head. FINDINGS: CTA NECK FINDINGS Aortic arch: Standard branching. Imaged portion shows no evidence of aneurysm or dissection. No significant stenosis of the major arch vessel origins. CABG with LIMA and saphenous grafts. Mixed plaque of aortic arch. Right carotid system: No evidence of dissection, stenosis (50% or greater) or occlusion. Non stenotic calcific atherosclerosis of carotid bifurcation. Left carotid system: No evidence of dissection, stenosis (50%  or greater) or occlusion. Non stenotic calcific atherosclerosis of carotid bifurcation. Vertebral arteries: Codominant. No evidence of dissection, stenosis (50% or greater) or occlusion. Skeleton: Cervical spondylosis with grade 1 anterolisthesis at C2-3 and C3-4, reversal of cervical lordosis, and advanced discogenic degenerative changes at C4-C7. Left-greater-than-right facet arthropathy. Multifactorial at least moderate spinal canal stenosis at C4-C7. Multilevel neural foraminal stenosis. Other neck: Negative. Upper chest: Partially visualized fatty mass within the right paramedian anterior chest wall spanning up to 12 cm, probably lipoma. Review of the MIP images confirms the above findings CTA HEAD FINDINGS Anterior circulation: No significant stenosis, proximal occlusion, aneurysm, or vascular malformation. Non stenotic calcific atherosclerosis of internal carotid arteries. Posterior circulation: No significant stenosis, proximal occlusion, aneurysm, or vascular malformation. Venous sinuses: As permitted by contrast timing, patent. Anatomic variants: None significant. Review of the MIP images confirms the above findings IMPRESSION: 1.  Patent carotid and vertebral arteries. No dissection, aneurysm, or hemodynamically significant stenosis utilizing NASCET criteria. 2. Patent anterior and posterior intracranial circulation. No large vessel occlusion, aneurysm, or significant stenosis. 3. Non stenotic calcific atherosclerosis of the aorta and carotid systems. 4. Advanced spondylosis of the cervical spine with at least moderate spinal canal stenosis from C4-C7. Electronically Signed   By: Kristine Garbe M.D.   On: 07/23/2018 19:11   Dg Chest 2 View  Result Date: 07/23/2018 CLINICAL DATA:  Slurred speech. EXAM: CHEST - 2 VIEW COMPARISON:  None. FINDINGS: The heart size and mediastinal contours are within normal limits. Both lungs are clear. The visualized skeletal structures are unremarkable. IMPRESSION: No active cardiopulmonary disease. Electronically Signed   By: Dorise Bullion III M.D   On: 07/23/2018 20:50   Ct Angio Neck W Or Wo Contrast  Result Date: 07/23/2018 CLINICAL DATA:  78 y/o M; right facial droop and slurred speech, stroke follow-up. EXAM: CT ANGIOGRAPHY HEAD AND NECK TECHNIQUE: Multidetector CT imaging of the head and neck was performed using the standard protocol during bolus administration of intravenous contrast. Multiplanar CT image reconstructions and MIPs were obtained to evaluate the vascular anatomy. Carotid stenosis measurements (when applicable) are obtained utilizing NASCET criteria, using the distal internal carotid diameter as the denominator. CONTRAST:  75 cc Omnipaque 350 COMPARISON:  07/23/2018 CT head. FINDINGS: CTA NECK FINDINGS Aortic arch: Standard branching. Imaged portion shows no evidence of aneurysm or dissection. No significant stenosis of the major arch vessel origins. CABG with LIMA and saphenous grafts. Mixed plaque of aortic arch. Right carotid system: No evidence of dissection, stenosis (50% or greater) or occlusion. Non stenotic calcific atherosclerosis of carotid bifurcation. Left  carotid system: No evidence of dissection, stenosis (50% or greater) or occlusion. Non stenotic calcific atherosclerosis of carotid bifurcation. Vertebral arteries: Codominant. No evidence of dissection, stenosis (50% or greater) or occlusion. Skeleton: Cervical spondylosis with grade 1 anterolisthesis at C2-3 and C3-4, reversal of cervical lordosis, and advanced discogenic degenerative changes at C4-C7. Left-greater-than-right facet arthropathy. Multifactorial at least moderate spinal canal stenosis at C4-C7. Multilevel neural foraminal stenosis. Other neck: Negative. Upper chest: Partially visualized fatty mass within the right paramedian anterior chest wall spanning up to 12 cm, probably lipoma. Review of the MIP images confirms the above findings CTA HEAD FINDINGS Anterior circulation: No significant stenosis, proximal occlusion, aneurysm, or vascular malformation. Non stenotic calcific atherosclerosis of internal carotid arteries. Posterior circulation: No significant stenosis, proximal occlusion, aneurysm, or vascular malformation. Venous sinuses: As permitted by contrast timing, patent. Anatomic variants: None significant. Review of the MIP images confirms the above findings IMPRESSION: 1.  Patent carotid and vertebral arteries. No dissection, aneurysm, or hemodynamically significant stenosis utilizing NASCET criteria. 2. Patent anterior and posterior intracranial circulation. No large vessel occlusion, aneurysm, or significant stenosis. 3. Non stenotic calcific atherosclerosis of the aorta and carotid systems. 4. Advanced spondylosis of the cervical spine with at least moderate spinal canal stenosis from C4-C7. Electronically Signed   By: Kristine Garbe M.D.   On: 07/23/2018 19:11   Ct Head Code Stroke Wo Contrast  Result Date: 07/23/2018 CLINICAL DATA:  Code stroke. Encephalopathy. Right-sided facial droop and slurred speech. Last seen normal 5 p.m. EXAM: CT HEAD WITHOUT CONTRAST TECHNIQUE:  Contiguous axial images were obtained from the base of the skull through the vertex without intravenous contrast. COMPARISON:  None. FINDINGS: Brain: There is no mass, hemorrhage or extra-axial collection. The size and configuration of the ventricles and extra-axial CSF spaces are normal. Areas of hypoattenuation of the deep gray nuclei and confluent periventricular white matter hypodensity, consistent with chronic small vessel disease. Old infarcts of the left basal ganglia and thalamus. Vascular: No abnormal hyperdensity of the major intracranial arteries or dural venous sinuses. No intracranial atherosclerosis. Skull: The visualized skull base, calvarium and extracranial soft tissues are normal. Sinuses/Orbits: No fluid levels or advanced mucosal thickening of the visualized paranasal sinuses. No mastoid or middle ear effusion. The orbits are normal. ASPECTS [facility] Stroke Program Early CT Score) - Ganglionic level infarction (caudate, lentiform nuclei, internal capsule, insula, M1-M3 cortex): 7 - Supraganglionic infarction (M4-M6 cortex): 3 Total score (0-10 with 10 being normal): 10 IMPRESSION: 1. No intracranial hemorrhage. 2. ASPECTS is 10. These results were communicated to Dr. Roland Rack at 6:47 pm on 07/23/2018 by text page via the [facility] messaging system. Electronically Signed   By: Ulyses Jarred M.D.   On: 07/23/2018 18:48   ekg nsr at 75, nl axis, nl pr int, nl qt, q in 2, 3, avf, early R progression, st elevation in the inferior leads, and v5,6, likely resultant of old MI   Assessment & Plan:    Principal Problem:   TIA (transient ischemic attack) Active Problems:   History of cerebrovascular accident (CVA) with residual deficit   Mixed hyperlipidemia   Benign essential HTN   Anxiety   Slurred speech   Right arm weakness   Hypokalemia  Right arm weakness, facial droop Slurred speech TIA r/o CVA Tele Check hga1c, lipid Check cardiac echo PT/OT, speech consult Permissive  hypertension DC Aggrenox (unclear why on Aggrenox and Plavix) Aspirin + Plavix Neurology consulted by ED, appreciate input Check cbc in am  PAfib Noted in records in care everywhere His wife notes last episode of atrial fibrillation about 2 years ago Defer to neurology in terms of DOAC vs aspirin / plavix  Hypertension, Hyperlipidemia, CAD s/p CABG, DES, CHF Aspirin, plavix as above Cont Atenolol '50mg'$  po qday Cont Lipitor '80mg'$  po qhs Cont Furosemide '80mg'$  po bid Cont Spironolactone '25mg'$  1/2 po qday Cont Imdur '30mg'$  po qday  Hypokalemia  Replete Check cmp in am  Hypothyroidism Check TSH Cont Levothyroxine 112 micrograms po qday  Depression Cont Zoloft '150mg'$  po qday Cont Lorazepam prn   OSA on CPAP   Hyperglycemia If hga1c >6.5  Then consider fsbs as well as SSI   DVT Prophylaxis-   SCDs  AM Labs Ordered, also please review Full Orders  Family Communication: Admission, patients condition and plan of care including tests being ordered have been discussed with the patient who indicate understanding and agree with the plan and  Code Status.  Code Status:  FULL CODE,  Attempted to notify his wife that pt admitted to [facility], left message  Admission status: Observation : Based on patients clinical presentation and evaluation of above clinical data, I have made determination that patient meets observation criteria at this time.  Depending upon results of w/up may require change to inpatient status.   Time spent in minutes : 70   Jani Gravel M.D on 07/23/2018 at 9:22 PM

## 2018-07-23 NOTE — ED Notes (Signed)
ED TO INPATIENT HANDOFF REPORT  ED Nurse Name and Phone #: Shyniece [phone]  S Name/Age/Gender Luke Mckee 78 y.o. male Room/Bed: 016C/016C  Code Status   Code Status: Full Code  Home/SNF/Other Home Patient oriented to: self, place, time and situation Is this baseline? Yes   Triage Complete: Triage complete  Chief Complaint CODE STROKE  Triage Note No notes on file   Allergies Allergies  Allergen Reactions  . Ace Inhibitors     Level of Care/Admitting Diagnosis ED Disposition    ED Disposition Condition Comment   Admit  [facility] [[id number]]  Level of Care: Telemetry Medical [104]  I expect the patient will be discharged within 24 hours: No (not a candidate for 5C-Observation unit)  Covid Evaluation: Asymptomatic Screening Protocol (No Symptoms)  Diagnosis: TIA (transient ischemic attack) [161096]) [167614]  Admitting Physician: Pearson GrippeKIM, JAMES [[id number]]  Attending Physician: Pearson GrippeKIM, JAMES [[id number]]  PT Class (Do Not Modify): Observation [104]  PT Acc Code (Do Not Modify): Observation [10022]       B Medical/Surgery History Past Medical History:  Diagnosis Date  . Anxiety   . Arthritis   . Chronic kidney disease   . Depression   . Heart murmur   . Hx of blood clots   . Thyroid disease    Past Surgical History:  Procedure Laterality Date  . APPENDECTOMY    . bi lateral hip replacement    . BUNIONECTOMY    . CATARACT EXTRACTION     Right eye  . CORONARY ARTERY BYPASS GRAFT    . REPLACEMENT TOTAL KNEE       A IV Location/Drains/Wounds Patient Lines/Drains/Airways Status   Active Line/Drains/Airways    Name:   Placement date:   Placement time:   Site:   Days:   Peripheral IV 07/23/18 Right Forearm   07/23/18    1850    Forearm   less than 1   Peripheral IV 07/23/18 Left Forearm   07/23/18    1900    Forearm   less than 1          Intake/Output Last 24 hours  Intake/Output Summary (Last 24 hours) at 07/23/2018 2019 Last data filed  at 07/23/2018 1930 Gross per 24 hour  Intake -  Output 400 ml  Net -400 ml    Labs/Imaging Results for orders placed or performed during the hospital encounter of 07/23/18 (from the past 48 hour(s))  Protime-INR     Status: None   Collection Time: 07/23/18  6:35 PM  Result Value Ref Range   Prothrombin Time 14.6 11.4 - 15.2 seconds   INR 1.2 0.8 - 1.2    Comment: (NOTE) INR goal varies based on device and disease states. Performed at [facility], [address] [place], [place] [address]   APTT     Status: None   Collection Time: 07/23/18  6:35 PM  Result Value Ref Range   aPTT 28 24 - 36 seconds    Comment: Performed at [facility], [address] [place], [place] [address]  CBC     Status: Abnormal   Collection Time: 07/23/18  6:35 PM  Result Value Ref Range   WBC 7.0 4.0 - 10.5 K/uL   RBC 4.18 (L) 4.22 - 5.81 MIL/uL   Hemoglobin 13.4 13.0 - 17.0 g/dL   HCT 14.740.3 82.939.0 - 56.252.0 %   MCV 96.4 80.0 - 100.0 fL   MCH 32.1 26.0 - 34.0 pg  MCHC 33.3 30.0 - 36.0 g/dL   RDW 29.514.3 62.111.5 - 30.815.5 %   Platelets 210 150 - 400 K/uL   nRBC 0.0 0.0 - 0.2 %    Comment: Performed at [facility], [address] [place], [place] [address]  Differential     Status: None   Collection Time: 07/23/18  6:35 PM  Result Value Ref Range   Neutrophils Relative % 55 %   Neutro Abs 3.8 1.7 - 7.7 K/uL   Lymphocytes Relative 30 %   Lymphs Abs 2.1 0.7 - 4.0 K/uL   Monocytes Relative 10 %   Monocytes Absolute 0.7 0.1 - 1.0 K/uL   Eosinophils Relative 4 %   Eosinophils Absolute 0.3 0.0 - 0.5 K/uL   Basophils Relative 1 %   Basophils Absolute 0.1 0.0 - 0.1 K/uL   Immature Granulocytes 0 %   Abs Immature Granulocytes 0.02 0.00 - 0.07 K/uL    Comment: Performed at [facility], [address] [place], [place] [address]  Comprehensive metabolic panel     Status: Abnormal   Collection Time: 07/23/18  6:35 PM  Result Value Ref Range   Sodium 140 135 - 145 mmol/L   Potassium  3.2 (L) 3.5 - 5.1 mmol/L   Chloride 103 98 - 111 mmol/L   CO2 26 22 - 32 mmol/L   Glucose, Bld 123 (H) 70 - 99 mg/dL   BUN 16 8 - 23 mg/dL   Creatinine, Ser 5.280.98 0.61 - 1.24 mg/dL   Calcium 8.9 8.9 - 41.310.3 mg/dL   Total Protein 7.3 6.5 - 8.1 g/dL   Albumin 3.8 3.5 - 5.0 g/dL   AST 21 15 - 41 U/L   ALT 20 0 - 44 U/L   Alkaline Phosphatase 54 38 - 126 U/L   Total Bilirubin 0.7 0.3 - 1.2 mg/dL   GFR calc non Af Amer >60 >60 mL/min   GFR calc Af Amer >60 >60 mL/min   Anion gap 11 5 - 15    Comment: Performed at [facility], [address] [place], [place] [address]  I-stat chem 8, ED     Status: Abnormal   Collection Time: 07/23/18  6:35 PM  Result Value Ref Range   Sodium 141 135 - 145 mmol/L   Potassium 3.2 (L) 3.5 - 5.1 mmol/L   Chloride 102 98 - 111 mmol/L   BUN 17 8 - 23 mg/dL   Creatinine, Ser 0.270.90 0.61 - 1.24 mg/dL   Glucose, Bld 253122 (H) 70 - 99 mg/dL   Calcium, Ion 6.641.09 (L) 1.15 - 1.40 mmol/L   TCO2 29 22 - 32 mmol/L   Hemoglobin 13.9 13.0 - 17.0 g/dL   HCT 40.341.0 47.439.0 - 25.952.0 %   Ct Angio Head W Or Wo Contrast  Result Date: 07/23/2018 CLINICAL DATA:  78 y/o M; right facial droop and slurred speech, stroke follow-up. EXAM: CT ANGIOGRAPHY HEAD AND NECK TECHNIQUE: Multidetector CT imaging of the head and neck was performed using the standard protocol during bolus administration of intravenous contrast. Multiplanar CT image reconstructions and MIPs were obtained to evaluate the vascular anatomy. Carotid stenosis measurements (when applicable) are obtained utilizing NASCET criteria, using the distal internal carotid diameter as the denominator. CONTRAST:  75 cc Omnipaque 350 COMPARISON:  07/23/2018 CT head. FINDINGS: CTA NECK FINDINGS Aortic arch: Standard branching. Imaged portion shows no evidence of aneurysm or dissection. No significant stenosis of the major arch vessel origins. CABG with LIMA  and saphenous grafts. Mixed plaque of aortic arch. Right carotid system: No  evidence of dissection, stenosis (50% or greater) or occlusion. Non stenotic calcific atherosclerosis of carotid bifurcation. Left carotid system: No evidence of dissection, stenosis (50% or greater) or occlusion. Non stenotic calcific atherosclerosis of carotid bifurcation. Vertebral arteries: Codominant. No evidence of dissection, stenosis (50% or greater) or occlusion. Skeleton: Cervical spondylosis with grade 1 anterolisthesis at C2-3 and C3-4, reversal of cervical lordosis, and advanced discogenic degenerative changes at C4-C7. Left-greater-than-right facet arthropathy. Multifactorial at least moderate spinal canal stenosis at C4-C7. Multilevel neural foraminal stenosis. Other neck: Negative. Upper chest: Partially visualized fatty mass within the right paramedian anterior chest wall spanning up to 12 cm, probably lipoma. Review of the MIP images confirms the above findings CTA HEAD FINDINGS Anterior circulation: No significant stenosis, proximal occlusion, aneurysm, or vascular malformation. Non stenotic calcific atherosclerosis of internal carotid arteries. Posterior circulation: No significant stenosis, proximal occlusion, aneurysm, or vascular malformation. Venous sinuses: As permitted by contrast timing, patent. Anatomic variants: None significant. Review of the MIP images confirms the above findings IMPRESSION: 1. Patent carotid and vertebral arteries. No dissection, aneurysm, or hemodynamically significant stenosis utilizing NASCET criteria. 2. Patent anterior and posterior intracranial circulation. No large vessel occlusion, aneurysm, or significant stenosis. 3. Non stenotic calcific atherosclerosis of the aorta and carotid systems. 4. Advanced spondylosis of the cervical spine with at least moderate spinal canal stenosis from C4-C7. Electronically Signed   By: Mitzi Hansen M.D.   On: 07/23/2018 19:11   Ct Angio Neck W Or Wo Contrast  Result Date: 07/23/2018 CLINICAL DATA:  78 y/o M;  right facial droop and slurred speech, stroke follow-up. EXAM: CT ANGIOGRAPHY HEAD AND NECK TECHNIQUE: Multidetector CT imaging of the head and neck was performed using the standard protocol during bolus administration of intravenous contrast. Multiplanar CT image reconstructions and MIPs were obtained to evaluate the vascular anatomy. Carotid stenosis measurements (when applicable) are obtained utilizing NASCET criteria, using the distal internal carotid diameter as the denominator. CONTRAST:  75 cc Omnipaque 350 COMPARISON:  07/23/2018 CT head. FINDINGS: CTA NECK FINDINGS Aortic arch: Standard branching. Imaged portion shows no evidence of aneurysm or dissection. No significant stenosis of the major arch vessel origins. CABG with LIMA and saphenous grafts. Mixed plaque of aortic arch. Right carotid system: No evidence of dissection, stenosis (50% or greater) or occlusion. Non stenotic calcific atherosclerosis of carotid bifurcation. Left carotid system: No evidence of dissection, stenosis (50% or greater) or occlusion. Non stenotic calcific atherosclerosis of carotid bifurcation. Vertebral arteries: Codominant. No evidence of dissection, stenosis (50% or greater) or occlusion. Skeleton: Cervical spondylosis with grade 1 anterolisthesis at C2-3 and C3-4, reversal of cervical lordosis, and advanced discogenic degenerative changes at C4-C7. Left-greater-than-right facet arthropathy. Multifactorial at least moderate spinal canal stenosis at C4-C7. Multilevel neural foraminal stenosis. Other neck: Negative. Upper chest: Partially visualized fatty mass within the right paramedian anterior chest wall spanning up to 12 cm, probably lipoma. Review of the MIP images confirms the above findings CTA HEAD FINDINGS Anterior circulation: No significant stenosis, proximal occlusion, aneurysm, or vascular malformation. Non stenotic calcific atherosclerosis of internal carotid arteries. Posterior circulation: No significant  stenosis, proximal occlusion, aneurysm, or vascular malformation. Venous sinuses: As permitted by contrast timing, patent. Anatomic variants: None significant. Review of the MIP images confirms the above findings IMPRESSION: 1. Patent carotid and vertebral arteries. No dissection, aneurysm, or hemodynamically significant stenosis utilizing NASCET criteria. 2. Patent anterior and posterior intracranial circulation. No large vessel occlusion,  aneurysm, or significant stenosis. 3. Non stenotic calcific atherosclerosis of the aorta and carotid systems. 4. Advanced spondylosis of the cervical spine with at least moderate spinal canal stenosis from C4-C7. Electronically Signed   By: Kristine Garbe M.D.   On: 07/23/2018 19:11   Ct Head Code Stroke Wo Contrast  Result Date: 07/23/2018 CLINICAL DATA:  Code stroke. Encephalopathy. Right-sided facial droop and slurred speech. Last seen normal 5 p.m. EXAM: CT HEAD WITHOUT CONTRAST TECHNIQUE: Contiguous axial images were obtained from the base of the skull through the vertex without intravenous contrast. COMPARISON:  None. FINDINGS: Brain: There is no mass, hemorrhage or extra-axial collection. The size and configuration of the ventricles and extra-axial CSF spaces are normal. Areas of hypoattenuation of the deep gray nuclei and confluent periventricular white matter hypodensity, consistent with chronic small vessel disease. Old infarcts of the left basal ganglia and thalamus. Vascular: No abnormal hyperdensity of the major intracranial arteries or dural venous sinuses. No intracranial atherosclerosis. Skull: The visualized skull base, calvarium and extracranial soft tissues are normal. Sinuses/Orbits: No fluid levels or advanced mucosal thickening of the visualized paranasal sinuses. No mastoid or middle ear effusion. The orbits are normal. ASPECTS [facility] Stroke Program Early CT Score) - Ganglionic level infarction (caudate, lentiform nuclei, internal capsule,  insula, M1-M3 cortex): 7 - Supraganglionic infarction (M4-M6 cortex): 3 Total score (0-10 with 10 being normal): 10 IMPRESSION: 1. No intracranial hemorrhage. 2. ASPECTS is 10. These results were communicated to Dr. Roland Rack at 6:47 pm on 07/23/2018 by text page via the [facility] messaging system. Electronically Signed   By: Ulyses Jarred M.D.   On: 07/23/2018 18:48    Pending Labs Unresulted Labs (From admission, onward)    Start     Ordered   07/24/18 0500  Hemoglobin A1c  Tomorrow morning,   R     07/23/18 1957   07/24/18 0500  Lipid panel  Tomorrow morning,   R    Comments: Fasting    07/23/18 1957   07/24/18 0500  Comprehensive metabolic panel  Tomorrow morning,   R     07/23/18 1957   07/24/18 0500  CBC  Tomorrow morning,   R     07/23/18 1957          Vitals/Pain Today's Vitals   07/23/18 1930 07/23/18 1945 07/23/18 2000 07/23/18 2015  BP: 109/63 109/71 114/62 119/74  Pulse: 70 66 69 67  Resp: (!) 24 (!) 24 19 (!) 28  Temp:      TempSrc:      SpO2: 96% 95% 93% 94%  Height:      PainSc:        Isolation Precautions No active isolations  Medications Medications   stroke: mapping our early stages of recovery book (has no administration in time range)  acetaminophen (TYLENOL) tablet 650 mg (has no administration in time range)    Or  acetaminophen (TYLENOL) solution 650 mg (has no administration in time range)    Or  acetaminophen (TYLENOL) suppository 650 mg (has no administration in time range)  sodium chloride flush (NS) 0.9 % injection 3 mL (3 mLs Intravenous Given 07/23/18 1927)  iohexol (OMNIPAQUE) 350 MG/ML injection 75 mL (75 mLs Intravenous Contrast Given 07/23/18 1844)    Mobility walks with person assist Moderate fall risk   Focused Assessments Neuro Assessment Handoff:  Swallow screen pass? Yes  Cardiac Rhythm: Normal sinus rhythm NIH Stroke Scale ( + Modified Stroke Scale Criteria)  Interval: Initial Level of Consciousness (  1a.)   :  Alert, keenly responsive LOC Questions (1b. )   +: Answers both questions correctly LOC Commands (1c. )   + : Performs both tasks correctly Best Gaze (2. )  +: Normal Visual (3. )  +: No visual loss Facial Palsy (4. )    : Minor paralysis Motor Arm, Left (5a. )   +: No drift Motor Arm, Right (5b. )   +: No drift Motor Leg, Left (6a. )   +: No drift Motor Leg, Right (6b. )   +: No drift Limb Ataxia (7. ): Present in one limb(R. Arm) Sensory (8. )   +: Normal, no sensory loss Best Language (9. )   +: No aphasia Dysarthria (10. ): Mild-to-moderate dysarthria, patient slurs at least some words and, at worst, can be understood with some difficulty Extinction/Inattention (11.)   +: No Abnormality Modified SS Total  +: 0 Complete NIHSS TOTAL: 3 Last date known well: 07/23/18 Last time known well: 1700 Neuro Assessment: Exceptions to WDL Neuro Checks:   Initial (07/23/18 1845)  Last Documented NIHSS Modified Score: 0 (07/23/18 2000) Has TPA been given? No If patient is a Neuro Trauma and patient is going to OR before floor call report to 4N Charge nurse: [phone] or [phone]     R Recommendations: See Admitting Provider Note  Report given to:   Additional Notes: symptoms are continuing to improve, dysarthria improving, no droop, drift on serial neuro exams. Pt is GCS 15 A&Ox4, NARD/NAD.

## 2018-07-23 NOTE — ED Notes (Signed)
MRI called RN phone for MRI results, results communicated to MD Aroor via [facility] page.

## 2018-07-24 ENCOUNTER — Inpatient Hospital Stay (HOSPITAL_COMMUNITY): Payer: Medicare Other

## 2018-07-24 ENCOUNTER — Encounter (HOSPITAL_COMMUNITY): Payer: Self-pay | Admitting: Internal Medicine

## 2018-07-24 DIAGNOSIS — I5032 Chronic diastolic (congestive) heart failure: Secondary | ICD-10-CM | POA: Diagnosis present

## 2018-07-24 DIAGNOSIS — Z1159 Encounter for screening for other viral diseases: Secondary | ICD-10-CM | POA: Diagnosis not present

## 2018-07-24 DIAGNOSIS — I6389 Other cerebral infarction: Secondary | ICD-10-CM | POA: Diagnosis not present

## 2018-07-24 DIAGNOSIS — I69354 Hemiplegia and hemiparesis following cerebral infarction affecting left non-dominant side: Secondary | ICD-10-CM | POA: Diagnosis not present

## 2018-07-24 DIAGNOSIS — F419 Anxiety disorder, unspecified: Secondary | ICD-10-CM

## 2018-07-24 DIAGNOSIS — I63412 Cerebral infarction due to embolism of left middle cerebral artery: Secondary | ICD-10-CM | POA: Diagnosis present

## 2018-07-24 DIAGNOSIS — R29703 NIHSS score 3: Secondary | ICD-10-CM | POA: Diagnosis present

## 2018-07-24 DIAGNOSIS — R4781 Slurred speech: Secondary | ICD-10-CM | POA: Diagnosis present

## 2018-07-24 DIAGNOSIS — I10 Essential (primary) hypertension: Secondary | ICD-10-CM | POA: Diagnosis not present

## 2018-07-24 DIAGNOSIS — E039 Hypothyroidism, unspecified: Secondary | ICD-10-CM | POA: Diagnosis present

## 2018-07-24 DIAGNOSIS — Z91048 Other nonmedicinal substance allergy status: Secondary | ICD-10-CM | POA: Diagnosis not present

## 2018-07-24 DIAGNOSIS — I2510 Atherosclerotic heart disease of native coronary artery without angina pectoris: Secondary | ICD-10-CM | POA: Diagnosis present

## 2018-07-24 DIAGNOSIS — E876 Hypokalemia: Secondary | ICD-10-CM | POA: Diagnosis present

## 2018-07-24 DIAGNOSIS — M1990 Unspecified osteoarthritis, unspecified site: Secondary | ICD-10-CM | POA: Diagnosis present

## 2018-07-24 DIAGNOSIS — I639 Cerebral infarction, unspecified: Secondary | ICD-10-CM | POA: Diagnosis present

## 2018-07-24 DIAGNOSIS — I480 Paroxysmal atrial fibrillation: Secondary | ICD-10-CM | POA: Diagnosis present

## 2018-07-24 DIAGNOSIS — G459 Transient cerebral ischemic attack, unspecified: Secondary | ICD-10-CM

## 2018-07-24 DIAGNOSIS — I272 Pulmonary hypertension, unspecified: Secondary | ICD-10-CM | POA: Diagnosis present

## 2018-07-24 DIAGNOSIS — I4891 Unspecified atrial fibrillation: Secondary | ICD-10-CM

## 2018-07-24 DIAGNOSIS — F329 Major depressive disorder, single episode, unspecified: Secondary | ICD-10-CM | POA: Diagnosis present

## 2018-07-24 DIAGNOSIS — I6930 Unspecified sequelae of cerebral infarction: Secondary | ICD-10-CM | POA: Diagnosis not present

## 2018-07-24 DIAGNOSIS — G8321 Monoplegia of upper limb affecting right dominant side: Secondary | ICD-10-CM | POA: Diagnosis present

## 2018-07-24 DIAGNOSIS — Z955 Presence of coronary angioplasty implant and graft: Secondary | ICD-10-CM | POA: Diagnosis not present

## 2018-07-24 DIAGNOSIS — R29810 Facial weakness: Secondary | ICD-10-CM | POA: Diagnosis present

## 2018-07-24 DIAGNOSIS — R739 Hyperglycemia, unspecified: Secondary | ICD-10-CM | POA: Diagnosis present

## 2018-07-24 DIAGNOSIS — G4733 Obstructive sleep apnea (adult) (pediatric): Secondary | ICD-10-CM | POA: Diagnosis present

## 2018-07-24 DIAGNOSIS — J449 Chronic obstructive pulmonary disease, unspecified: Secondary | ICD-10-CM | POA: Diagnosis present

## 2018-07-24 DIAGNOSIS — I130 Hypertensive heart and chronic kidney disease with heart failure and stage 1 through stage 4 chronic kidney disease, or unspecified chronic kidney disease: Secondary | ICD-10-CM | POA: Diagnosis present

## 2018-07-24 DIAGNOSIS — Z888 Allergy status to other drugs, medicaments and biological substances status: Secondary | ICD-10-CM | POA: Diagnosis not present

## 2018-07-24 DIAGNOSIS — Z951 Presence of aortocoronary bypass graft: Secondary | ICD-10-CM | POA: Diagnosis not present

## 2018-07-24 LAB — TSH: TSH: 2.086 u[IU]/mL (ref 0.350–4.500)

## 2018-07-24 LAB — LIPID PANEL
Cholesterol: 123 mg/dL (ref 0–200)
HDL: 33 mg/dL — ABNORMAL LOW (ref >40)
LDL Cholesterol: 65 mg/dL (ref 0–99)
Total CHOL/HDL Ratio: 3.7 RATIO
Triglycerides: 123 mg/dL (ref <150)
VLDL: 25 mg/dL (ref 0–40)

## 2018-07-24 LAB — CBC
HCT: 37.1 % — ABNORMAL LOW (ref 39.0–52.0)
Hemoglobin: 12.6 g/dL — ABNORMAL LOW (ref 13.0–17.0)
MCH: 32 pg (ref 26.0–34.0)
MCHC: 34 g/dL (ref 30.0–36.0)
MCV: 94.2 fL (ref 80.0–100.0)
Platelets: 201 10*3/uL (ref 150–400)
RBC: 3.94 MIL/uL — ABNORMAL LOW (ref 4.22–5.81)
RDW: 14.1 % (ref 11.5–15.5)
WBC: 7.3 10*3/uL (ref 4.0–10.5)
nRBC: 0 % (ref 0.0–0.2)

## 2018-07-24 LAB — ECHOCARDIOGRAM COMPLETE: Height: 68 in

## 2018-07-24 LAB — COMPREHENSIVE METABOLIC PANEL
ALT: 17 U/L (ref 0–44)
AST: 17 U/L (ref 15–41)
Albumin: 3.4 g/dL — ABNORMAL LOW (ref 3.5–5.0)
Alkaline Phosphatase: 47 U/L (ref 38–126)
Anion gap: 10 (ref 5–15)
BUN: 12 mg/dL (ref 8–23)
CO2: 26 mmol/L (ref 22–32)
Calcium: 8.5 mg/dL — ABNORMAL LOW (ref 8.9–10.3)
Chloride: 104 mmol/L (ref 98–111)
Creatinine, Ser: 0.83 mg/dL (ref 0.61–1.24)
GFR calc Af Amer: >60 mL/min (ref >60)
GFR calc non Af Amer: >60 mL/min (ref >60)
Glucose, Bld: 101 mg/dL — ABNORMAL HIGH (ref 70–99)
Potassium: 3.3 mmol/L — ABNORMAL LOW (ref 3.5–5.1)
Sodium: 140 mmol/L (ref 135–145)
Total Bilirubin: 0.8 mg/dL (ref 0.3–1.2)
Total Protein: 6.6 g/dL (ref 6.5–8.1)

## 2018-07-24 LAB — HEMOGLOBIN A1C
Hgb A1c MFr Bld: 6.3 % — ABNORMAL HIGH (ref 4.8–5.6)
Mean Plasma Glucose: 134.11 mg/dL

## 2018-07-24 LAB — MAGNESIUM: Magnesium: 2.2 mg/dL (ref 1.7–2.4)

## 2018-07-24 MED ORDER — SODIUM CHLORIDE 0.9 % IV BOLUS
500.0000 mL | Freq: Once | INTRAVENOUS | Status: AC
  Administered 2018-07-24: 500 mL via INTRAVENOUS

## 2018-07-24 MED ORDER — POTASSIUM CHLORIDE CRYS ER 20 MEQ PO TBCR
40.0000 meq | EXTENDED_RELEASE_TABLET | ORAL | Status: AC
  Administered 2018-07-24 (×2): 40 meq via ORAL
  Filled 2018-07-24 (×2): qty 2

## 2018-07-24 MED ORDER — PERFLUTREN LIPID MICROSPHERE
1.0000 mL | INTRAVENOUS | Status: AC | PRN
  Filled 2018-07-24: qty 10

## 2018-07-24 NOTE — Progress Notes (Signed)
Patient refused CPAP.  Placed on continuous pulse ox.

## 2018-07-24 NOTE — Evaluation (Signed)
Occupational Therapy Evaluation Patient Details Name: Luke Mckee MRN: [id number] DOB: [birth year] Today's Date: 07/24/2018    History of Present Illness 78 y.o. male, w hypothyroidism, hypertension, CAD s/p CABG (2005)/DES stentx2 (2018), CHF, h/o CVA with left hemiplegia , OSA, COPD, mild pulmonary hypertension,  ?CKD, Anxiety/depression who presented with c/o right arm weakness, numbness, right facial droop and slurred speech. MRI of the brain showed multiple punctate acute/early subacute infarctions within the left posterior MCA distribution   Clinical Impression   Patient presenting with decreased I in self care, balance, functional mobility, and safety awareness.  Patient reports being I without use of AD PTA. Patient currently functioning at supervision level overall with min cuing for safety awareness. R UE weakness but still functional. Patient will benefit from acute OT to increase overall independence in the areas of ADLs, functional mobility, and safety awareness in order to safely discharge home with caregiver.    Follow Up Recommendations  Home health OT;Supervision - Intermittent    Equipment Recommendations  Tub/shower seat    Recommendations for Other Services (none at this time)     Precautions / Restrictions Precautions Precautions: Fall      Mobility Bed Mobility Overal bed mobility: Modified Independent       Transfers Overall transfer level: Needs assistance Equipment used: None Transfers: Stand Pivot Transfers;Sit to/from Stand Sit to Stand: Supervision Stand pivot transfers: Supervision            Balance Overall balance assessment: Needs assistance Sitting-balance support: Feet supported Sitting balance-Leahy Scale: Good     Standing balance support: During functional activity Standing balance-Leahy Scale: Fair Standing balance comment: close supervision for balance without use of AD          ADL either performed or assessed with  clinical judgement   ADL Overall ADL's : Needs assistance/impaired      General ADL Comments: supervision overall for all tasks for safety awareness     Vision Baseline Vision/History: Wears glasses Wears Glasses: Reading only Patient Visual Report: No change from baseline Vision Assessment?: No apparent visual deficits            Pertinent Vitals/Pain Pain Assessment: No/denies pain     Hand Dominance Right   Extremity/Trunk Assessment Upper Extremity Assessment Upper Extremity Assessment: RUE deficits/detail RUE Deficits / Details: 3+/5 throughout but functional RUE Sensation: WNL RUE Coordination: decreased fine motor   Lower Extremity Assessment Lower Extremity Assessment: Defer to PT evaluation   Cervical / Trunk Assessment Cervical / Trunk Assessment: Normal   Communication Communication Communication: No difficulties   Cognition Arousal/Alertness: Awake/alert Behavior During Therapy: WFL for tasks assessed/performed Overall Cognitive Status: Within Functional Limits for tasks assessed                         Home Living Family/patient expects to be discharged to:: Private residence Living Arrangements: Spouse/significant other Available Help at Discharge: Family;Available 24 hours/day Type of Home: House Home Access: Stairs to enter [employer] of Steps: 3 in front and 5 in back Entrance Stairs-Rails: Can reach both Home Layout: One level     Bathroom Shower/Tub: Occupational psychologist: Standard     Home Equipment: None      Lives With: Spouse    Prior Functioning/Environment Level of Independence: Independent                 OT Problem List: Decreased strength;Decreased coordination;Impaired balance (sitting and/or standing);Decreased activity tolerance;Decreased safety  awareness      OT Treatment/Interventions: Self-care/ADL training;Therapeutic exercise;Therapeutic activities;Neuromuscular  education;Energy conservation;Patient/family education;Balance training    OT Goals(Current goals can be found in the care plan section) Acute Rehab OT Goals Patient Stated Goal: to go home OT Goal Formulation: With patient Time For Goal Achievement: 08/07/18 Potential to Achieve Goals: Good ADL Goals Pt Will Perform Upper Body Bathing: with modified independence Pt Will Perform Lower Body Bathing: with modified independence Pt Will Perform Upper Body Dressing: with modified independence Pt Will Perform Lower Body Dressing: with modified independence Pt Will Transfer to Toilet: with modified independence Pt Will Perform Toileting - Clothing Manipulation and hygiene: with modified independence Pt Will Perform Tub/Shower Transfer: with modified independence  OT Frequency: Min 2X/week   Barriers to D/C: Other (comment)  none known at this time          AM-PAC OT "6 Clicks" Daily Activity     Outcome Measure Help from another person eating meals?: None Help from another person taking care of personal grooming?: None Help from another person toileting, which includes using toliet, bedpan, or urinal?: A Little Help from another person bathing (including washing, rinsing, drying)?: A Little Help from another person to put on and taking off regular upper body clothing?: None Help from another person to put on and taking off regular lower body clothing?: A Little 6 Click Score: 21   End of Session Nurse Communication: Mobility status;Precautions  Activity Tolerance: Patient tolerated treatment well Patient left: in bed;with call bell/phone within reach;with bed alarm set  OT Visit Diagnosis: Muscle weakness (generalized) (M62.81);Unsteadiness on feet (R26.81)                Time: [id number] OT Time Calculation (min): 18 min Charges:  OT General Charges $OT Visit: 1 Visit OT Evaluation $OT Eval Low Complexity: 1 Low   Luke Mckee P, MS, OTR/L 07/24/2018, 12:45 PM

## 2018-07-24 NOTE — Progress Notes (Signed)
Lower extremity venous has been completed.   Preliminary results in CV Proc.   Abram Sander 07/24/2018 10:11 AM

## 2018-07-24 NOTE — Progress Notes (Signed)
SLP Cancellation Note  Patient Details Name: Luke Mckee MRN: [id number] DOB: [birth year]   Cancelled treatment:       Reason Eval/Treat Not Completed: Patient at procedure or test/unavailable(Pt having Echo at this time. SLP will follow up.)  Jay Haskew I. Hardin Negus, [place], [place] Office number [phone] Pager [place] 07/24/2018, 9:40 AM

## 2018-07-24 NOTE — Progress Notes (Signed)
[facility] PROGRESS NOTE  Luke Mckee ZOX:[id number] DOB: 1940/09/19 DOA: 07/23/2018 PCP: [name], DO  Assessment/Plan:   CVA. Patient presented with right arm weakness, facial droop, Slurred speech. No events on tele, CT head no acute abnormalities. MRI with multiple punctate L MCA infarcts embolic secondary to known AF not on AC, Carotid doppler B ICA 1-39% stenosis, VAs antegrade, LDL 65, HgA1c 6.3, evaluated by OT recommended HH, evaluated PT recommended no HH, speech recommends no further therapy.  -follow echo -await neuro recommendations Aspirin + Plavix  PAfib [place] vasc score 7 Noted in records in care everywhere. ekg with SR. Per  Neurology, from stroke prevention standpoint no need both aggrenox and plavix. - aspirin / plavix  Hypertension, Hyperlipidemia, CAD s/p CABG, DES, CHF. BP somewhat labile. SBP 88 last night. Home meds include Atenolol  Furosemide  Spironolactone  Imdur  -will hold lasix and BB -monitor closely  Hypokalemia potassium 3.3 this am.  -check mag level -Replete Check cmp in am  Hypothyroidism Check TSH Cont Levothyroxine 112 micrograms po qday  Depression stable at baseline Cont Zoloft  po qday Cont Lorazepam prn   OSA on CPAP   Hyperglycemia serum glucose 103 today. HgA1c 6.3.  -monitor    Code Status: full Family Communication: patient  Disposition Plan: home tomorrow    Consultants:  Stroke team  Procedures:  Echo   Antibiotics:    HPI/Subjective: Awake alert reports feeling better.   Objective: Vitals:   07/24/18 0730 07/24/18 1202  BP: 117/63 101/63  Pulse: 62 68  Resp: 16 20  Temp: 97.6 F (36.4 C) 97.7 F (36.5 C)  SpO2: 92% (!) 89%    Intake/Output Summary (Last 24 hours) at 07/24/2018 1321 Last data filed at 07/23/2018 1930 Gross per 24 hour  Intake -  Output 400 ml  Net -400 ml   There were no vitals filed for this visit.  Exam:   General:  Awake alert no  acute distress  Cardiovascular: rrr no mgr no LE edema  Respiratory: normal effort BS clear bilaterally no wheeze  Abdomen: obese soft +BS no guarding or rebounding  Musculoskeletal: joints without swelling/erythema   Data Reviewed: Basic Metabolic Panel: Recent Labs  Lab 07/23/18 1835 07/24/18 0458  NA 140  141 140  K 3.2*  3.2* 3.3*  CL 103  102 104  CO2 26 26  GLUCOSE 123*  122* 101*  BUN CREATININE 0.98  0.90 0.83  CALCIUM 8.9 8.5*   Liver Function Tests: Recent Labs  Lab 07/23/18 1835 07/24/18 0458  AST 21 17  ALT 20 17  ALKPHOS 54 47  BILITOT 0.7 0.8  PROT 7.3 6.6  ALBUMIN 3.8 3.4*   No results for input(s): LIPASE, AMYLASE in the last 168 hours. No results for input(s): AMMONIA in the last 168 hours. CBC: Recent Labs  Lab 07/23/18 1835 07/24/18 0458  WBC 7.0 7.3  NEUTROABS 3.8  --   HGB 13.4  13.9 12.6*  HCT 40.3  41.0 37.1*  MCV 96.4 94.2  PLT 210 201   Cardiac Enzymes: No results for input(s): CKTOTAL, CKMB, CKMBINDEX, TROPONINI in the last 168 hours. BNP (last 3 results) No results for input(s): BNP in the last 8760 hours.  ProBNP (last 3 results) No results for input(s): PROBNP in the last 8760 hours.  CBG: No results for input(s): GLUCAP in the last 168 hours.  Recent Results (from the past 240 hour(s))  SARS Coronavirus 2 (CEPHEID -  Performed in [facility] hospital lab), Hosp Order     Status: None   Collection Time: 07/23/18  9:38 PM   Specimen: Nasopharyngeal Swab  Result Value Ref Range Status   SARS Coronavirus 2 NEGATIVE NEGATIVE Final    Comment: (NOTE) If result is NEGATIVE SARS-CoV-2 target nucleic acids are NOT DETECTED. The SARS-CoV-2 RNA is generally detectable in upper and lower  respiratory specimens during the acute phase of infection. The lowest  concentration of SARS-CoV-2 viral copies this assay can detect is 250  copies / mL. A negative result does not preclude SARS-CoV-2 infection  and  should not be used as the sole basis for treatment or other  patient management decisions.  A negative result may occur with  improper specimen collection / handling, submission of specimen other  than nasopharyngeal swab, presence of viral mutation(s) within the  areas targeted by this assay, and inadequate number of viral copies  (<250 copies / mL). A negative result must be combined with clinical  observations, patient history, and epidemiological information. If result is POSITIVE SARS-CoV-2 target nucleic acids are DETECTED. The SARS-CoV-2 RNA is generally detectable in upper and lower  respiratory specimens dur ing the acute phase of infection.  Positive  results are indicative of active infection with SARS-CoV-2.  Clinical  correlation with patient history and other diagnostic information is  necessary to determine patient infection status.  Positive results do  not rule out bacterial infection or co-infection with other viruses. If result is PRESUMPTIVE POSTIVE SARS-CoV-2 nucleic acids MAY BE PRESENT.   A presumptive positive result was obtained on the submitted specimen  and confirmed on repeat testing.  While 2019 novel coronavirus  (SARS-CoV-2) nucleic acids may be present in the submitted sample  additional confirmatory testing may be necessary for epidemiological  and / or clinical management purposes  to differentiate between  SARS-CoV-2 and other Sarbecovirus currently known to infect humans.  If clinically indicated additional testing with an alternate test  methodology [id number]) is advised. The SARS-CoV-2 RNA is generally  detectable in upper and lower respiratory sp ecimens during the acute  phase of infection. The expected result is Negative. Fact Sheet for Patients:  BoilerBrush.com.cy Fact Sheet for Healthcare Providers: https://pope.com/ This test is not yet approved or cleared by the [place] FDA and has been  authorized for detection and/or diagnosis of SARS-CoV-2 by FDA under an Emergency Use Authorization (EUA).  This EUA will remain in effect (meaning this test can be used) for the duration of the COVID-19 declaration under Section 564(b)(1) of the Act, 21 U.S.C. section 360bbb-3(b)(1), unless the authorization is terminated or revoked sooner. Performed at [facility], [address], [place], [place] [address]      Studies: Ct Angio Head W Or Wo Contrast  Result Date: 07/23/2018 CLINICAL DATA:  78 y/o M; right facial droop and slurred speech, stroke follow-up. EXAM: CT ANGIOGRAPHY HEAD AND NECK TECHNIQUE: Multidetector CT imaging of the head and neck was performed using the standard protocol during bolus administration of intravenous contrast. Multiplanar CT image reconstructions and MIPs were obtained to evaluate the vascular anatomy. Carotid stenosis measurements (when applicable) are obtained utilizing NASCET criteria, using the distal internal carotid diameter as the denominator. CONTRAST:  75 cc Omnipaque 350 COMPARISON:  07/23/2018 CT head. FINDINGS: CTA NECK FINDINGS Aortic arch: Standard branching. Imaged portion shows no evidence of aneurysm or dissection. No significant stenosis of the major arch vessel origins. CABG with LIMA and saphenous grafts. Mixed  plaque of aortic arch. Right carotid system: No evidence of dissection, stenosis (50% or greater) or occlusion. Non stenotic calcific atherosclerosis of carotid bifurcation. Left carotid system: No evidence of dissection, stenosis (50% or greater) or occlusion. Non stenotic calcific atherosclerosis of carotid bifurcation. Vertebral arteries: Codominant. No evidence of dissection, stenosis (50% or greater) or occlusion. Skeleton: Cervical spondylosis with grade 1 anterolisthesis at C2-3 and C3-4, reversal of cervical lordosis, and advanced discogenic degenerative changes at C4-C7. Left-greater-than-right facet arthropathy. Multifactorial  at least moderate spinal canal stenosis at C4-C7. Multilevel neural foraminal stenosis. Other neck: Negative. Upper chest: Partially visualized fatty mass within the right paramedian anterior chest wall spanning up to 12 cm, probably lipoma. Review of the MIP images confirms the above findings CTA HEAD FINDINGS Anterior circulation: No significant stenosis, proximal occlusion, aneurysm, or vascular malformation. Non stenotic calcific atherosclerosis of internal carotid arteries. Posterior circulation: No significant stenosis, proximal occlusion, aneurysm, or vascular malformation. Venous sinuses: As permitted by contrast timing, patent. Anatomic variants: None significant. Review of the MIP images confirms the above findings IMPRESSION: 1. Patent carotid and vertebral arteries. No dissection, aneurysm, or hemodynamically significant stenosis utilizing NASCET criteria. 2. Patent anterior and posterior intracranial circulation. No large vessel occlusion, aneurysm, or significant stenosis. 3. Non stenotic calcific atherosclerosis of the aorta and carotid systems. 4. Advanced spondylosis of the cervical spine with at least moderate spinal canal stenosis from C4-C7. Electronically Signed   By: Kristine Garbe M.D.   On: 07/23/2018 19:11   Dg Chest 2 View  Result Date: 07/23/2018 CLINICAL DATA:  Slurred speech. EXAM: CHEST - 2 VIEW COMPARISON:  None. FINDINGS: The heart size and mediastinal contours are within normal limits. Both lungs are clear. The visualized skeletal structures are unremarkable. IMPRESSION: No active cardiopulmonary disease. Electronically Signed   By: Dorise Bullion III M.D   On: 07/23/2018 20:50   Ct Angio Neck W Or Wo Contrast  Result Date: 07/23/2018 CLINICAL DATA:  78 y/o M; right facial droop and slurred speech, stroke follow-up. EXAM: CT ANGIOGRAPHY HEAD AND NECK TECHNIQUE: Multidetector CT imaging of the head and neck was performed using the standard protocol during bolus  administration of intravenous contrast. Multiplanar CT image reconstructions and MIPs were obtained to evaluate the vascular anatomy. Carotid stenosis measurements (when applicable) are obtained utilizing NASCET criteria, using the distal internal carotid diameter as the denominator. CONTRAST:  75 cc Omnipaque 350 COMPARISON:  07/23/2018 CT head. FINDINGS: CTA NECK FINDINGS Aortic arch: Standard branching. Imaged portion shows no evidence of aneurysm or dissection. No significant stenosis of the major arch vessel origins. CABG with LIMA and saphenous grafts. Mixed plaque of aortic arch. Right carotid system: No evidence of dissection, stenosis (50% or greater) or occlusion. Non stenotic calcific atherosclerosis of carotid bifurcation. Left carotid system: No evidence of dissection, stenosis (50% or greater) or occlusion. Non stenotic calcific atherosclerosis of carotid bifurcation. Vertebral arteries: Codominant. No evidence of dissection, stenosis (50% or greater) or occlusion. Skeleton: Cervical spondylosis with grade 1 anterolisthesis at C2-3 and C3-4, reversal of cervical lordosis, and advanced discogenic degenerative changes at C4-C7. Left-greater-than-right facet arthropathy. Multifactorial at least moderate spinal canal stenosis at C4-C7. Multilevel neural foraminal stenosis. Other neck: Negative. Upper chest: Partially visualized fatty mass within the right paramedian anterior chest wall spanning up to 12 cm, probably lipoma. Review of the MIP images confirms the above findings CTA HEAD FINDINGS Anterior circulation: No significant stenosis, proximal occlusion, aneurysm, or vascular malformation. Non stenotic calcific atherosclerosis of internal carotid arteries. Posterior  circulation: No significant stenosis, proximal occlusion, aneurysm, or vascular malformation. Venous sinuses: As permitted by contrast timing, patent. Anatomic variants: None significant. Review of the MIP images confirms the above  findings IMPRESSION: 1. Patent carotid and vertebral arteries. No dissection, aneurysm, or hemodynamically significant stenosis utilizing NASCET criteria. 2. Patent anterior and posterior intracranial circulation. No large vessel occlusion, aneurysm, or significant stenosis. 3. Non stenotic calcific atherosclerosis of the aorta and carotid systems. 4. Advanced spondylosis of the cervical spine with at least moderate spinal canal stenosis from C4-C7. Electronically Signed   By: Mitzi Hansen M.D.   On: 07/23/2018 19:11   Mr Brain Wo Contrast  Result Date: 07/23/2018 CLINICAL DATA:  78 y/o M; right-sided weakness, facial droop, and slurred speech. EXAM: MRI HEAD WITHOUT CONTRAST TECHNIQUE: Multiplanar, multiecho pulse sequences of the brain and surrounding structures were obtained without intravenous contrast. COMPARISON:  07/23/2018 CT head and CTA head. FINDINGS: Brain: Multiple scattered punctate foci of reduced diffusion are present in the left posterior frontal lobe and the parietal lobe compatible with acute/early subacute infarction. No associated acute hemorrhage or mass effect. Punctate and early confluent nonspecific T2 FLAIR hyperintensities in subcortical and periventricular white matter are compatible with mild to moderate chronic microvascular ischemic changes. Mild volume loss of the brain. Small chronic infarcts are present in the left thalamus, caudate head, and putamen extending into periventricular white matter. Small focus of cortical encephalomalacia of the left anterior inferior frontal lobe. No extra-axial collection, hydrocephalus, or herniation. Vascular: Normal flow voids. Skull and upper cervical spine: Normal marrow signal. Sinuses/Orbits: Mild anterior ethmoid and left maxillary sinus mucosal thickening. Right intra-ocular lens replacement. Trace opacification of right mastoid air cells. Other: None. IMPRESSION: 1. Multiple punctate acute/early subacute infarctions within  the left posterior MCA distribution. No associated hemorrhage or mass effect. 2. Mild-to-moderate chronic microvascular ischemic changes and mild volume loss of the brain. Small chronic infarctions in the left basal ganglia. Small area of encephalomalacia of left anterior inferior frontal lobe, possibly chronic infarct or sequelae of traumatic brain injury. These results will be called to the ordering clinician or representative by the Radiologist Assistant, and communication documented in the PACS or zVision Dashboard. Electronically Signed   By: Mitzi Hansen M.D.   On: 07/23/2018 21:30   Ct Head Code Stroke Wo Contrast  Result Date: 07/23/2018 CLINICAL DATA:  Code stroke. Encephalopathy. Right-sided facial droop and slurred speech. Last seen normal 5 p.m. EXAM: CT HEAD WITHOUT CONTRAST TECHNIQUE: Contiguous axial images were obtained from the base of the skull through the vertex without intravenous contrast. COMPARISON:  None. FINDINGS: Brain: There is no mass, hemorrhage or extra-axial collection. The size and configuration of the ventricles and extra-axial CSF spaces are normal. Areas of hypoattenuation of the deep gray nuclei and confluent periventricular white matter hypodensity, consistent with chronic small vessel disease. Old infarcts of the left basal ganglia and thalamus. Vascular: No abnormal hyperdensity of the major intracranial arteries or dural venous sinuses. No intracranial atherosclerosis. Skull: The visualized skull base, calvarium and extracranial soft tissues are normal. Sinuses/Orbits: No fluid levels or advanced mucosal thickening of the visualized paranasal sinuses. No mastoid or middle ear effusion. The orbits are normal. ASPECTS [facility] Stroke Program Early CT Score) - Ganglionic level infarction (caudate, lentiform nuclei, internal capsule, insula, M1-M3 cortex): 7 - Supraganglionic infarction (M4-M6 cortex): 3 Total score (0-10 with 10 being normal): 10 IMPRESSION: 1. No  intracranial hemorrhage. 2. ASPECTS is 10. These results were communicated to Dr. Ritta Slot at  6:47 pm on 07/23/2018 by text page via the [facility] messaging system. Electronically Signed   By: Deatra RobinsonKevin  Herman M.D.   On: 07/23/2018 18:48   Vas [place] Lower Extremity Venous (dvt)  Result Date: 07/24/2018  Lower Venous Study Indications: Stroke.  Comparison Study: No prior Performing Technologist: Blanch MediaMegan Riddle RVS  Examination Guidelines: A complete evaluation includes B-mode imaging, spectral Doppler, color Doppler, and power Doppler as needed of all accessible portions of each vessel. Bilateral testing is considered an integral part of a complete examination. Limited examinations for reoccurring indications may be performed as noted.  +---------+---------------+---------+-----------+----------+-------+ RIGHT    CompressibilityPhasicitySpontaneityPropertiesSummary +---------+---------------+---------+-----------+----------+-------+ CFV      Full           Yes      Yes                          +---------+---------------+---------+-----------+----------+-------+ SFJ      Full                                                 +---------+---------------+---------+-----------+----------+-------+ FV Prox  Full                                                 +---------+---------------+---------+-----------+----------+-------+ FV Mid   Full                                                 +---------+---------------+---------+-----------+----------+-------+ FV DistalFull                                                 +---------+---------------+---------+-----------+----------+-------+ PFV      Full                                                 +---------+---------------+---------+-----------+----------+-------+ POP      Full           Yes      Yes                          +---------+---------------+---------+-----------+----------+-------+ PTV      Full                                                  +---------+---------------+---------+-----------+----------+-------+ PERO     Full                                                 +---------+---------------+---------+-----------+----------+-------+   +---------+---------------+---------+-----------+----------+-------+ LEFT     CompressibilityPhasicitySpontaneityPropertiesSummary +---------+---------------+---------+-----------+----------+-------+  CFV      Full           Yes      Yes                          +---------+---------------+---------+-----------+----------+-------+ SFJ      Full                                                 +---------+---------------+---------+-----------+----------+-------+ FV Prox  Full                                                 +---------+---------------+---------+-----------+----------+-------+ FV Mid   Full                                                 +---------+---------------+---------+-----------+----------+-------+ FV DistalFull                                                 +---------+---------------+---------+-----------+----------+-------+ PFV      Full                                                 +---------+---------------+---------+-----------+----------+-------+ POP      Full           Yes      Yes                          +---------+---------------+---------+-----------+----------+-------+ PTV      Full                                                 +---------+---------------+---------+-----------+----------+-------+ PERO     Full                                                 +---------+---------------+---------+-----------+----------+-------+     Summary: Right: There is no evidence of deep vein thrombosis in the lower extremity. No cystic structure found in the popliteal fossa. Left: There is no evidence of deep vein thrombosis in the lower extremity. No cystic structure found in the  popliteal fossa.  *See table(s) above for measurements and observations.    Preliminary     Scheduled Meds: . aspirin EC  81 mg Oral Daily  . atenolol  50 mg Oral QPM  . atorvastatin  80 mg Oral Daily  . clopidogrel  75 mg Oral Daily  . furosemide  80 mg Oral BID  . isosorbide mononitrate  30 mg Oral Daily  . levothyroxine  112 mcg Oral Q0600  . sertraline  150 mg Oral Daily  . spironolactone  12.5 mg Oral Daily   Continuous Infusions:  Principal Problem:   Stroke [facility](HCC) Active Problems:   Benign essential HTN   Atrial fibrillation (HCC)   History of cerebrovascular accident (CVA) with residual deficit   Mixed hyperlipidemia   Hypokalemia   Anxiety    Time spent: 35 minutes    [facility] Hospital[facility]ACK,[place] Baria M NP  [facility]  If 7PM-7AM, please contact night-coverage at [email or web address], password [email or web address] 07/24/2018, 1:21 PM  LOS: 0 days

## 2018-07-24 NOTE — Progress Notes (Signed)
07/24/18 1252  PT Evaluation and Discharge  Last PT Received On 07/24/18  Assistance Needed +1  History of Present Illness 78 y.o. male, w hypothyroidism, hypertension, CAD s/p CABG (2005)/DES stentx2 (2018), CHF, h/o CVA with left hemiplegia , OSA, COPD, mild pulmonary hypertension,  ?CKD, Anxiety/depression who presented with c/o right arm weakness, numbness, right facial droop and slurred speech. MRI of the brain showed multiple punctate acute/early subacute infarctions within the left posterior MCA distribution  Precautions  Precautions Fall  Restrictions  Weight Bearing Restrictions No  Home Living  Family/patient expects to be discharged to: Private residence  Living Arrangements Spouse/significant other  Available Help at Discharge Family;Available 24 hours/day  Type of Home House  Home Access Stairs to enter  Entrance Stairs-Number of Steps 3 in front and 5 in back  Entrance Stairs-Rails Can reach both  Home Layout One level  Animal nutritionist - 2 wheels;Cane - single point;BSC;Shower seat  Prior Function  Level of Independence Independent with assistive device(s)  Comments Reports occasional use of cane or RW.   Communication  Communication No difficulties  Pain Assessment  Pain Assessment No/denies pain  Cognition  Arousal/Alertness Awake/alert  Behavior During Therapy WFL for tasks assessed/performed  Overall Cognitive Status Within Functional Limits for tasks assessed  Upper Extremity Assessment  Upper Extremity Assessment Defer to OT evaluation  Lower Extremity Assessment  Lower Extremity Assessment LLE deficits/detail  LLE Deficits / Details LLE weakness at baseline. Functional ROM noted  Cervical / Trunk Assessment  Cervical / Trunk Assessment Normal  Bed Mobility  Overal bed mobility Modified Independent  Transfers  Overall transfer level Needs assistance  Equipment used None  Transfers  Sit to/from Stand  Sit to Stand Supervision  General transfer comment Supervision for safety.   Ambulation/Gait  Ambulation/Gait assistance Supervision  Gait Distance (Feet) 250 Feet  Assistive device None  Gait Pattern/deviations Step-through pattern;Decreased stride length  General Gait Details Slow, cautious gait. Mild unsteadiness noted with horizontal and vertical head turns, however, no overt LOB. Pt reports he feels he is close to his baseline with mobility.   Gait velocity Decreased  Stairs Yes  Stairs assistance Min guard  Stair Management Two rails;Alternating pattern;Forwards  Number of Stairs 6  General stair comments OVerall steady stair navigation with use of rails. Min guard for safety.   Modified Rankin (Stroke Patients Only)  Pre-Morbid Rankin Score 2  Modified Rankin 4  Balance  Overall balance assessment Needs assistance  Sitting-balance support Feet supported  Sitting balance-Leahy Scale Good  Standing balance support During functional activity  Standing balance-Leahy Scale Fair  PT - End of Session  Equipment Utilized During Treatment Gait belt  Activity Tolerance Patient tolerated treatment well  Patient left in chair;with call bell/phone within reach;with chair alarm set  Nurse Communication Mobility status  PT Assessment  PT Recommendation/Assessment Patent does not need any further PT services  PT Visit Diagnosis Muscle weakness (generalized) (M62.81)  No Skilled PT Patient at baseline level of functioning;Patient will have necessary level of assist by caregiver at discharge;All education completed  AM-PAC PT "6 Clicks" Mobility Outcome Measure (Version 2)  Help needed turning from your back to your side while in a flat bed without using bedrails? 4  Help needed moving from lying on your back to sitting on the side of a flat bed without using bedrails? 4  Help needed moving to and from a bed to a chair (including  a wheelchair)? 4  Help needed standing up  from a chair using your arms (e.g., wheelchair or bedside chair)? 4  Help needed to walk in hospital room? 4  Help needed climbing 3-5 steps with a railing?  3  6 Click Score 23  Consider Recommendation of Discharge To: Home with no services  PT Recommendation  Follow Up Recommendations No PT follow up  PT equipment None recommended by PT  Acute Rehab PT Goals  Patient Stated Goal to go home  PT Goal Formulation With patient  Time For Goal Achievement 07/24/18  Potential to Achieve Goals Good  PT Time Calculation  PT Start Time (ACUTE ONLY) 1108  PT Stop Time (ACUTE ONLY) 1124  PT Time Calculation (min) (ACUTE ONLY) 16 min  PT General Charges  $$ ACUTE PT VISIT 1 Visit  PT Evaluation  $PT Eval Low Complexity 1 Low   Patient evaluated by Physical Therapy with no further acute PT needs identified. All education has been completed and the patient has no further questions. Pt with mild unsteadiness with dynamic activity, however, no LOB noted. Pt reports L sided deficits at baseline secondary to previous CVA. Reports he is at his baseline with mobility tasks. Reports wife can provide assist if needed. See below for any follow-up Physical Therapy or equipment needs. PT is signing off. Thank you for this referral. If needs change, please re-consult.   Gladys DammeBrittany Jaamal Farooqui, PT, DPT  Acute Rehabilitation Services  Pager: [phone] Office: [phone]

## 2018-07-24 NOTE — Progress Notes (Signed)
NP paged for Patient low BP. Awaiting response.

## 2018-07-24 NOTE — Progress Notes (Signed)
Echocardiogram 2D Echocardiogram has been performed.  Matilde Bash 07/24/2018, 10:11 AM

## 2018-07-24 NOTE — Progress Notes (Signed)
STROKE TEAM PROGRESS NOTE   INTERVAL HISTORY Pt lying in bed. Still has mild dysarthria and right facial droop. Also has mild right dysmetria on FTN. He denies any heart palpitation or racing heart. Will need loop recorder. Pt also a candidate for AMOXIC trial.   Vitals:   07/24/18 0000 07/24/18 0200 07/24/18 0400 07/24/18 0730  BP: (!) 114/58 (!) 82/47 121/64 117/63  Pulse:   67 62  Resp:    16  Temp:    97.6 F (36.4 C)  TempSrc:    Oral  SpO2:    92%  Height:        CBC:  Recent Labs  Lab 07/23/18 1835 07/24/18 0458  WBC 7.0 7.3  NEUTROABS 3.8  --   HGB 13.4  13.9 12.6*  HCT 40.3  41.0 37.1*  MCV 96.4 94.2  PLT 210 201    Basic Metabolic Panel:  Recent Labs  Lab 07/23/18 1835 07/24/18 0458  NA 140  141 140  K 3.2*  3.2* 3.3*  CL 103  102 104  CO2 26 26  GLUCOSE 123*  122* 101*  BUN 16  17 12   CREATININE 0.98  0.90 0.83  CALCIUM 8.9 8.5*   Lipid Panel:     Component Value Date/Time   CHOL 123 07/24/2018 0458   TRIG 123 07/24/2018 0458   HDL 33 (L) 07/24/2018 0458   CHOLHDL 3.7 07/24/2018 0458   VLDL 25 07/24/2018 0458   LDLCALC 65 07/24/2018 0458   HgbA1c:  Lab Results  Component Value Date   HGBA1C 6.3 (H) 07/24/2018   Urine Drug Screen: No results found for: LABOPIA, COCAINSCRNUR, LABBENZ, AMPHETMU, THCU, LABBARB  Alcohol Level No results found for: ETH  IMAGING Ct Angio Head W Or Wo Contrast  Result Date: 07/23/2018 CLINICAL DATA:  78 y/o M; right facial droop and slurred speech, stroke follow-up. EXAM: CT ANGIOGRAPHY HEAD AND NECK TECHNIQUE: Multidetector CT imaging of the head and neck was performed using the standard protocol during bolus administration of intravenous contrast. Multiplanar CT image reconstructions and MIPs were obtained to evaluate the vascular anatomy. Carotid stenosis measurements (when applicable) are obtained utilizing NASCET criteria, using the distal internal carotid diameter as the denominator. CONTRAST:  75 cc  Omnipaque 350 COMPARISON:  07/23/2018 CT head. FINDINGS: CTA NECK FINDINGS Aortic arch: Standard branching. Imaged portion shows no evidence of aneurysm or dissection. No significant stenosis of the major arch vessel origins. CABG with LIMA and saphenous grafts. Mixed plaque of aortic arch. Right carotid system: No evidence of dissection, stenosis (50% or greater) or occlusion. Non stenotic calcific atherosclerosis of carotid bifurcation. Left carotid system: No evidence of dissection, stenosis (50% or greater) or occlusion. Non stenotic calcific atherosclerosis of carotid bifurcation. Vertebral arteries: Codominant. No evidence of dissection, stenosis (50% or greater) or occlusion. Skeleton: Cervical spondylosis with grade 1 anterolisthesis at C2-3 and C3-4, reversal of cervical lordosis, and advanced discogenic degenerative changes at C4-C7. Left-greater-than-right facet arthropathy. Multifactorial at least moderate spinal canal stenosis at C4-C7. Multilevel neural foraminal stenosis. Other neck: Negative. Upper chest: Partially visualized fatty mass within the right paramedian anterior chest wall spanning up to 12 cm, probably lipoma. Review of the MIP images confirms the above findings CTA HEAD FINDINGS Anterior circulation: No significant stenosis, proximal occlusion, aneurysm, or vascular malformation. Non stenotic calcific atherosclerosis of internal carotid arteries. Posterior circulation: No significant stenosis, proximal occlusion, aneurysm, or vascular malformation. Venous sinuses: As permitted by contrast timing, patent. Anatomic variants: None significant. Review of  the MIP images confirms the above findings IMPRESSION: 1. Patent carotid and vertebral arteries. No dissection, aneurysm, or hemodynamically significant stenosis utilizing NASCET criteria. 2. Patent anterior and posterior intracranial circulation. No large vessel occlusion, aneurysm, or significant stenosis. 3. Non stenotic calcific  atherosclerosis of the aorta and carotid systems. 4. Advanced spondylosis of the cervical spine with at least moderate spinal canal stenosis from C4-C7. Electronically Signed   By: Mitzi HansenLance  Furusawa-Stratton M.D.   On: 07/23/2018 19:11   Dg Chest 2 View  Result Date: 07/23/2018 CLINICAL DATA:  Slurred speech. EXAM: CHEST - 2 VIEW COMPARISON:  None. FINDINGS: The heart size and mediastinal contours are within normal limits. Both lungs are clear. The visualized skeletal structures are unremarkable. IMPRESSION: No active cardiopulmonary disease. Electronically Signed   By: Gerome Samavid  Williams III M.D   On: 07/23/2018 20:50   Ct Angio Neck W Or Wo Contrast  Result Date: 07/23/2018 CLINICAL DATA:  78 y/o M; right facial droop and slurred speech, stroke follow-up. EXAM: CT ANGIOGRAPHY HEAD AND NECK TECHNIQUE: Multidetector CT imaging of the head and neck was performed using the standard protocol during bolus administration of intravenous contrast. Multiplanar CT image reconstructions and MIPs were obtained to evaluate the vascular anatomy. Carotid stenosis measurements (when applicable) are obtained utilizing NASCET criteria, using the distal internal carotid diameter as the denominator. CONTRAST:  75 cc Omnipaque 350 COMPARISON:  07/23/2018 CT head. FINDINGS: CTA NECK FINDINGS Aortic arch: Standard branching. Imaged portion shows no evidence of aneurysm or dissection. No significant stenosis of the major arch vessel origins. CABG with LIMA and saphenous grafts. Mixed plaque of aortic arch. Right carotid system: No evidence of dissection, stenosis (50% or greater) or occlusion. Non stenotic calcific atherosclerosis of carotid bifurcation. Left carotid system: No evidence of dissection, stenosis (50% or greater) or occlusion. Non stenotic calcific atherosclerosis of carotid bifurcation. Vertebral arteries: Codominant. No evidence of dissection, stenosis (50% or greater) or occlusion. Skeleton: Cervical spondylosis with  grade 1 anterolisthesis at C2-3 and C3-4, reversal of cervical lordosis, and advanced discogenic degenerative changes at C4-C7. Left-greater-than-right facet arthropathy. Multifactorial at least moderate spinal canal stenosis at C4-C7. Multilevel neural foraminal stenosis. Other neck: Negative. Upper chest: Partially visualized fatty mass within the right paramedian anterior chest wall spanning up to 12 cm, probably lipoma. Review of the MIP images confirms the above findings CTA HEAD FINDINGS Anterior circulation: No significant stenosis, proximal occlusion, aneurysm, or vascular malformation. Non stenotic calcific atherosclerosis of internal carotid arteries. Posterior circulation: No significant stenosis, proximal occlusion, aneurysm, or vascular malformation. Venous sinuses: As permitted by contrast timing, patent. Anatomic variants: None significant. Review of the MIP images confirms the above findings IMPRESSION: 1. Patent carotid and vertebral arteries. No dissection, aneurysm, or hemodynamically significant stenosis utilizing NASCET criteria. 2. Patent anterior and posterior intracranial circulation. No large vessel occlusion, aneurysm, or significant stenosis. 3. Non stenotic calcific atherosclerosis of the aorta and carotid systems. 4. Advanced spondylosis of the cervical spine with at least moderate spinal canal stenosis from C4-C7. Electronically Signed   By: Mitzi HansenLance  Furusawa-Stratton M.D.   On: 07/23/2018 19:11   Mr Brain Wo Contrast  Result Date: 07/23/2018 CLINICAL DATA:  78 y/o M; right-sided weakness, facial droop, and slurred speech. EXAM: MRI HEAD WITHOUT CONTRAST TECHNIQUE: Multiplanar, multiecho pulse sequences of the brain and surrounding structures were obtained without intravenous contrast. COMPARISON:  07/23/2018 CT head and CTA head. FINDINGS: Brain: Multiple scattered punctate foci of reduced diffusion are present in the left posterior frontal lobe and  the parietal lobe compatible with  acute/early subacute infarction. No associated acute hemorrhage or mass effect. Punctate and early confluent nonspecific T2 FLAIR hyperintensities in subcortical and periventricular white matter are compatible with mild to moderate chronic microvascular ischemic changes. Mild volume loss of the brain. Small chronic infarcts are present in the left thalamus, caudate head, and putamen extending into periventricular white matter. Small focus of cortical encephalomalacia of the left anterior inferior frontal lobe. No extra-axial collection, hydrocephalus, or herniation. Vascular: Normal flow voids. Skull and upper cervical spine: Normal marrow signal. Sinuses/Orbits: Mild anterior ethmoid and left maxillary sinus mucosal thickening. Right intra-ocular lens replacement. Trace opacification of right mastoid air cells. Other: None. IMPRESSION: 1. Multiple punctate acute/early subacute infarctions within the left posterior MCA distribution. No associated hemorrhage or mass effect. 2. Mild-to-moderate chronic microvascular ischemic changes and mild volume loss of the brain. Small chronic infarctions in the left basal ganglia. Small area of encephalomalacia of left anterior inferior frontal lobe, possibly chronic infarct or sequelae of traumatic brain injury. These results will be called to the ordering clinician or representative by the Radiologist Assistant, and communication documented in the PACS or zVision Dashboard. Electronically Signed   By: Mitzi Hansen M.D.   On: 07/23/2018 21:30   Ct Head Code Stroke Wo Contrast  Result Date: 07/23/2018 CLINICAL DATA:  Code stroke. Encephalopathy. Right-sided facial droop and slurred speech. Last seen normal 5 p.m. EXAM: CT HEAD WITHOUT CONTRAST TECHNIQUE: Contiguous axial images were obtained from the base of the skull through the vertex without intravenous contrast. COMPARISON:  None. FINDINGS: Brain: There is no mass, hemorrhage or extra-axial collection. The  size and configuration of the ventricles and extra-axial CSF spaces are normal. Areas of hypoattenuation of the deep gray nuclei and confluent periventricular white matter hypodensity, consistent with chronic small vessel disease. Old infarcts of the left basal ganglia and thalamus. Vascular: No abnormal hyperdensity of the major intracranial arteries or dural venous sinuses. No intracranial atherosclerosis. Skull: The visualized skull base, calvarium and extracranial soft tissues are normal. Sinuses/Orbits: No fluid levels or advanced mucosal thickening of the visualized paranasal sinuses. No mastoid or middle ear effusion. The orbits are normal. ASPECTS [facility] Stroke Program Early CT Score) - Ganglionic level infarction (caudate, lentiform nuclei, internal capsule, insula, M1-M3 cortex): 7 - Supraganglionic infarction (M4-M6 cortex): 3 Total score (0-10 with 10 being normal): 10 IMPRESSION: 1. No intracranial hemorrhage. 2. ASPECTS is 10. These results were communicated to Dr. Ritta Slot at 6:47 pm on 07/23/2018 by text page via the [facility] messaging system. Electronically Signed   By: Deatra Robinson M.D.   On: 07/23/2018 18:48   Vas [place] Lower Extremity Venous (dvt)  Result Date: 07/24/2018  Lower Venous Study Indications: Stroke.  Comparison Study: No prior Performing Technologist: Blanch Media RVS  Examination Guidelines: A complete evaluation includes B-mode imaging, spectral Doppler, color Doppler, and power Doppler as needed of all accessible portions of each vessel. Bilateral testing is considered an integral part of a complete examination. Limited examinations for reoccurring indications may be performed as noted.  +---------+---------------+---------+-----------+----------+-------+ RIGHT    CompressibilityPhasicitySpontaneityPropertiesSummary +---------+---------------+---------+-----------+----------+-------+ CFV      Full           Yes      Yes                           +---------+---------------+---------+-----------+----------+-------+ SFJ      Full                                                 +---------+---------------+---------+-----------+----------+-------+  FV Prox  Full                                                 +---------+---------------+---------+-----------+----------+-------+ FV Mid   Full                                                 +---------+---------------+---------+-----------+----------+-------+ FV DistalFull                                                 +---------+---------------+---------+-----------+----------+-------+ PFV      Full                                                 +---------+---------------+---------+-----------+----------+-------+ POP      Full           Yes      Yes                          +---------+---------------+---------+-----------+----------+-------+ PTV      Full                                                 +---------+---------------+---------+-----------+----------+-------+ PERO     Full                                                 +---------+---------------+---------+-----------+----------+-------+   +---------+---------------+---------+-----------+----------+-------+ LEFT     CompressibilityPhasicitySpontaneityPropertiesSummary +---------+---------------+---------+-----------+----------+-------+ CFV      Full           Yes      Yes                          +---------+---------------+---------+-----------+----------+-------+ SFJ      Full                                                 +---------+---------------+---------+-----------+----------+-------+ FV Prox  Full                                                 +---------+---------------+---------+-----------+----------+-------+ FV Mid   Full                                                 +---------+---------------+---------+-----------+----------+-------+ FV DistalFull                                                  +---------+---------------+---------+-----------+----------+-------+  PFV      Full                                                 +---------+---------------+---------+-----------+----------+-------+ POP      Full           Yes      Yes                          +---------+---------------+---------+-----------+----------+-------+ PTV      Full                                                 +---------+---------------+---------+-----------+----------+-------+ PERO     Full                                                 +---------+---------------+---------+-----------+----------+-------+     Summary: Right: There is no evidence of deep vein thrombosis in the lower extremity. No cystic structure found in the popliteal fossa. Left: There is no evidence of deep vein thrombosis in the lower extremity. No cystic structure found in the popliteal fossa.  *See table(s) above for measurements and observations.    Preliminary     PHYSICAL EXAM  Temp:  [97.5 F (36.4 C)-98 F (36.7 C)] 97.5 F (36.4 C) (07/13 1537) Pulse Rate:  [62-74] 66 (07/13 1537) Resp:  [16-29] 20 (07/13 1537) BP: (82-133)/(47-85) 100/63 (07/13 1537) SpO2:  [78 %-98 %] 93 % (07/13 1537)  General - Well nourished, well developed, in no apparent distress.  Ophthalmologic - fundi not visualized due to noncooperation.  Cardiovascular - Regular rate and rhythm.  Mental Status -  Level of arousal and orientation to time, place, and person were intact. Language including expression, naming, repetition, comprehension was assessed and found intact. Mild dysarthria Fund of Knowledge was assessed and was intact.  Cranial Nerves II - XII - II - Visual field intact OU. III, IV, VI - Extraocular movements intact. V - Facial sensation intact bilaterally. VII - mild right nasolabial fold flattening. VIII - Hearing & vestibular intact bilaterally. X - Palate  elevates symmetrically, mild dysarthria. XI - Chin turning & shoulder shrug intact bilaterally. XII - Tongue protrusion intact.  Motor Strength - The patient's strength was normal in all extremities and pronator drift was absent.  Bulk was normal and fasciculations were absent.   Motor Tone - Muscle tone was assessed at the neck and appendages and was normal.  Reflexes - The patient's reflexes were symmetrical in all extremities and he had no pathological reflexes.  Sensory - Light touch, temperature/pinprick were assessed and were symmetrical.    Coordination - The patient had normal movements in the left hand with no ataxia or dysmetria, however, right FTN dysmetria.  Tremor was absent.  Gait and Station - deferred.   ASSESSMENT/PLAN Mr. Bransyn Adami is a 78 y.o. male with history of HTN, CVA in April 2020 w/ L sided deficits, CAD sp CABG and thyroid disease presenting with R facial droop and surred speech.   Stroke:   Multiple punctate L  MCA infarcts embolic secondary to unknown source, concerning for cardioembolic source  Code Stroke CT head No acute abnormality. Old left caudate and thalamic infarcts    CTA head & neck no LVO, no significant stenosis, atherosclerosis, advanced cervical spondylosis C4-7  MRI  Mult punctate L posterior MCA infarcts. L anterior frontal lobe w/ encephalomalacia.  2D Echo EF 50%, normal LA size  LE venous doppler no DVT  Loop recorder placement requested for tomorrow   LDL 65  HgbA1c 6.3  SCDs for VTE prophylaxis  clopidogrel 75 mg daily and dipyridamole SR 250 mg/aspirin 25 mg twice a day prior to admission, now on aspirin 81 mg daily and clopidogrel 75 mg daily. Continue ASA and plavix for 3 weeks and then plavix alone  Therapy recommendations:  pending   Disposition:  pending   ?? Atrial Fibrillation per IM note, unable to find in Care Everywhere  Wife stated that pt has heart monitoring 2 years ago for SOB, lethargy and generalized  weakness, was told to have some irregular heart beat but not sure if afib, no specific treatment and pt later received cardiac stent  [facility] Karin LieuBurky and PA Raenette Roveravid Ghroise were not available to confirm . Pt has risk to develop afib including CAD s/p CABG in 2006 and stent 2018, heart monitoring showing irregular heart beat in the past . Pt denies heart palpitation or heart racing  Recommend Loop recorder placement for tomorrow    Hypertension  Stable . Permissive hypertension (OK if < 220/120) but gradually normalize in 3-5 days . Long-term BP goal normotensive  Hyperlipidemia  Home meds:  lipitor 80, resumed in hospital  LDL 65, goal < 70  Continue statin at discharge  Hyperglycemia->Prediabetes  HgbA1c 6.5, goal < 7.0  CBGs  SSI  Other Stroke Risk Factors  Advanced age  Hx ETOH use  Obesity, Body mass index is 37.4 kg/m., recommend weight loss, diet and exercise as appropriate   Hx stroke/TIA  04/2018 - CV[facility] w reported L sided deficits with residue[facility]  [facility] T[facility]  Coronary artery disease s/p CABG. DES 11/2016 w/ planned DAPT x 1 yr.  Obstructive sleep apnea on CPAP  Hx CHF  Other Active Problems  Hypokalemia  Depression on zoloft  Hypothyroid on synthroid  Hospital day # 0  I spent  35 minutes in total face-to-face time with the patient, more than 50% of which was spent in counseling and coordination of care, reviewing test results, images and medication, and discussing the diagnosis of stroke, hx of stroke, ?? Afib, CAD s/p CABG and stenting, treatment plan and potential prognosis. This patient's care requiresreview of multiple databases, neurological assessment, discussion with family, other specialists and medical decision making of high complexity. I had long discussion with pt at bedside and his wife over the phone, updated pt current condition, treatment plan and potential prognosis. They expressed understanding and  appreciation.    Marvel PlanJindong Allye Hoyos, MD PhD Stroke Neurology 07/24/2018 5:02 PM    To contact Stroke Continuity provider, please refer to [email or web address]. After hours, contact General Neurology

## 2018-07-24 NOTE — Evaluation (Signed)
Speech Language Pathology Evaluation Patient Details Name: Luke Mckee MRN: [id number] DOB: 01/08/1941 Today's Date: 07/24/2018 Time: 1125-     Problem List:  Patient Active Problem List   Diagnosis Date Noted  . Stroke (HCC) 07/24/2018  . TIA (transient ischemic attack) 07/23/2018  . Slurred speech 07/23/2018  . Right arm weakness 07/23/2018  . Hypokalemia 07/23/2018  . Hemiplegia of left nondominant side due to cerebrovascular disease (HCC) 07/20/2018  . Anxiety 07/20/2018  . Atherosclerosis of native coronary artery of native heart with stable angina pectoris (HCC) 06/16/2018  . Hx of CABG 06/16/2018  . Mixed hyperlipidemia 06/16/2018  . Benign essential HTN 06/16/2018  . History of cerebrovascular accident (CVA) with residual deficit 06/07/2018   Past Medical History:  Past Medical History:  Diagnosis Date  . Anxiety   . Arthritis   . CAD (coronary artery disease) 2005   s/p CABG, DES  . CHF (congestive heart failure) (HCC)    in EPIC care everywhere  . Chronic kidney disease   . COPD (chronic obstructive pulmonary disease) (HCC)    in EPIC care everywhere  . Depression   . Heart murmur   . History of stroke   . Hypothyroidism   . OSA (obstructive sleep apnea)   . Paroxysmal A-fib (HCC)    in EPIC careeverywhere   Past Surgical History:  Past Surgical History:  Procedure Laterality Date  . APPENDECTOMY    . BUNIONECTOMY    . CATARACT EXTRACTION     Right eye  . COLONOSCOPY  07/06/2004  . CORONARY ARTERY BYPASS GRAFT  2005  . TOTAL HIP ARTHROPLASTY Right 11/23/2011  . TOTAL HIP ARTHROPLASTY Left 09/07/2011  . TOTAL KNEE ARTHROPLASTY Right 02/15/2018  . VASECTOMY  1978   HPI:  Pt is a 78 y.o. male, w hypothyroidism, hypertension, CAD s/p CABG (2005)/DES stentx2 (2018), CHF, h/o CVA with left hemiplegia , OSA, COPD, mild pulmonary hypertension,  ?CKD, Anxiety/depression who presented with c/o right arm weakness, numbness, right facial droop and slurred  speech. MRI of the brain showed multiple punctate acute/early subacute infarctions within the left posterior MCA distribution.   Assessment / Plan / Recommendation Clinical Impression  Pt reported that he was living independently with his wife prior to admission. He denied any baseline deficits in speech or cognition but stated that he still has occasional difficulty with word retrieval s/p prior CVA. His speech and cognitive-linguistic skills are currently within normal limits and his language skills are within functional limits despite some occasional word retrieval difficulty during conversation and some difficulty completing 3-step commands. However, pt reported that his performance today was representative of his baseline level of functioning. Further skilled SLP services are not clinically indicated at this time. Pt, and nursing were educated regarding results and recommendations, and both parties verbalized understanding as well as agreement with plan of care.    SLP Assessment  SLP Recommendation/Assessment: Patient does not need any further Speech Lanaguage Pathology Services SLP Visit Diagnosis: Aphasia (R47.01)    Follow Up Recommendations  None    Frequency and Duration           SLP Evaluation Cognition  Overall Cognitive Status: Within Functional Limits for tasks assessed Arousal/Alertness: Awake/alert Orientation Level: Oriented X4 Attention: Focused;Sustained Focused Attention: Appears intact Sustained Attention: Appears intact Memory: Appears intact(Immediate: 3/3; delayed: 3/3) Awareness: Appears intact Problem Solving: Appears intact       Comprehension  Auditory Comprehension Overall Auditory Comprehension: Appears within functional limits for tasks assessed Yes/No Questions:  Within Functional Limits Basic Biographical Questions: (5/5) Complex Questions: (5/5) Paragraph Comprehension (via yes/no questions): (4/4) Commands: Within Functional Limits Two Step  Basic Commands: (4/4) Multistep Basic Commands: (2/4) Visual Recognition/Discrimination Discrimination: Within Function Limits Reading Comprehension Reading Status: Within funtional limits    Expression Expression Primary Mode of Expression: Verbal Verbal Expression Overall Verbal Expression: Appears within functional limits for tasks assessed Initiation: No impairment Automatic Speech: Counting;Day of week;Month of year(WNL) Level of Generative/Spontaneous Verbalization: Conversation Repetition: No impairment Naming: No impairment(Additional processing time occasionally needed during conver) Pragmatics: No impairment   Oral / Motor  Oral Motor/Sensory Function Overall Oral Motor/Sensory Function: Within functional limits Motor Speech Overall Motor Speech: Appears within functional limits for tasks assessed Respiration: Within functional limits Phonation: Normal Resonance: Within functional limits Articulation: Within functional limitis Intelligibility: Intelligible Motor Planning: Witnin functional limits   Merrit Friesen I. Hardin Negus, [place], [place] Office number [phone] Pager [place] 07/24/2018, 11:54 AM

## 2018-07-25 ENCOUNTER — Encounter (HOSPITAL_COMMUNITY)
Admission: EM | Disposition: A | Discharge status: Home Health | Payer: Self-pay | Source: Home / Self Care | Attending: Internal Medicine

## 2018-07-25 ENCOUNTER — Encounter (HOSPITAL_COMMUNITY): Payer: Self-pay | Admitting: Cardiology

## 2018-07-25 DIAGNOSIS — I6389 Other cerebral infarction: Secondary | ICD-10-CM

## 2018-07-25 HISTORY — PX: LOOP RECORDER INSERTION: EP1214

## 2018-07-25 LAB — BASIC METABOLIC PANEL
Anion gap: 8 (ref 5–15)
BUN: 14 mg/dL (ref 8–23)
CO2: 26 mmol/L (ref 22–32)
Calcium: 9 mg/dL (ref 8.9–10.3)
Chloride: 106 mmol/L (ref 98–111)
Creatinine, Ser: 0.84 mg/dL (ref 0.61–1.24)
GFR calc Af Amer: >60 mL/min (ref >60)
GFR calc non Af Amer: >60 mL/min (ref >60)
Glucose, Bld: 106 mg/dL — ABNORMAL HIGH (ref 70–99)
Potassium: 4 mmol/L (ref 3.5–5.1)
Sodium: 140 mmol/L (ref 135–145)

## 2018-07-25 SURGERY — LOOP RECORDER INSERTION

## 2018-07-25 MED ORDER — LORATADINE 10 MG PO TABS: 10.0000 mg | ORAL_TABLET | Freq: Every day | ORAL | Status: DC

## 2018-07-25 MED ORDER — LIDOCAINE-EPINEPHRINE 1 %-1:100000 IJ SOLN
INTRAMUSCULAR | Status: DC | PRN
  Administered 2018-07-25: 20 mL

## 2018-07-25 MED ORDER — ASPIRIN 81 MG PO TBEC: 81.0000 mg | DELAYED_RELEASE_TABLET | Freq: Every day | ORAL | Status: AC

## 2018-07-25 MED ORDER — LIDOCAINE-EPINEPHRINE 1 %-1:100000 IJ SOLN
INTRAMUSCULAR | Status: AC
  Filled 2018-07-25: qty 1

## 2018-07-25 SURGICAL SUPPLY — 2 items
LOOP REVEAL LINQSYS (Prosthesis & Implant Heart) ×2 IMPLANT
PACK LOOP INSERTION (CUSTOM PROCEDURE TRAY) ×3 IMPLANT

## 2018-07-25 NOTE — Progress Notes (Signed)
STROKE TEAM PROGRESS NOTE   INTERVAL HISTORY Pt is sitting in bed for lunch. Had loop recorder placed. No complains. He has his cardiologist in [place].   Vitals:   07/25/18 0020 07/25/18 0344 07/25/18 0811 07/25/18 1133  BP: (!) 116/51 (!) 123/52 127/68 124/69  Pulse: 69 71 68 70  Resp: (!) Temp: 98 F (36.7 C) 98.2 F (36.8 C) 97.6 F (36.4 C) 97.7 F (36.5 C)  TempSrc: Oral Oral Oral Oral  SpO2: 97% 97% 97% 95%  Height:        CBC:  Recent Labs  Lab 07/23/18 1835 07/24/18 0458  WBC 7.0 7.3  NEUTROABS 3.8  --   HGB 13.4  13.9 12.6*  HCT 40.3  41.0 37.1*  MCV 96.4 94.2  PLT 210 201    Basic Metabolic Panel:  Recent Labs  Lab 07/24/18 0458 07/24/18 1440 07/25/18 0454  NA 140  --  140  K 3.3*  --  4.0  CL 104  --  106  CO2 26  --  26  GLUCOSE 101*  --  106*  BUN 12  --  14  CREATININE 0.83  --  0.84  CALCIUM 8.5*  --  9.0  MG  --  2.2  --    Lipid Panel:     Component Value Date/Time   CHOL 123 07/24/2018 0458   TRIG 123 07/24/2018 0458   HDL 33 (L) 07/24/2018 0458   CHOLHDL 3.7 07/24/2018 0458   VLDL 25 07/24/2018 0458   LDLCALC 65 07/24/2018 0458   HgbA1c:  Lab Results  Component Value Date   HGBA1C 6.3 (H) 07/24/2018   Urine Drug Screen: No results found for: LABOPIA, COCAINSCRNUR, LABBENZ, AMPHETMU, THCU, LABBARB  Alcohol Level No results found for: ETH  IMAGING Ct Angio Head W Or Wo Contrast  Result Date: 07/23/2018 CLINICAL DATA:  78 y/o M; right facial droop and slurred speech, stroke follow-up. EXAM: CT ANGIOGRAPHY HEAD AND NECK TECHNIQUE: Multidetector CT imaging of the head and neck was performed using the standard protocol during bolus administration of intravenous contrast. Multiplanar CT image reconstructions and MIPs were obtained to evaluate the vascular anatomy. Carotid stenosis measurements (when applicable) are obtained utilizing NASCET criteria, using the distal internal carotid diameter as the denominator.  CONTRAST:  75 cc Omnipaque 350 COMPARISON:  07/23/2018 CT head. FINDINGS: CTA NECK FINDINGS Aortic arch: Standard branching. Imaged portion shows no evidence of aneurysm or dissection. No significant stenosis of the major arch vessel origins. CABG with LIMA and saphenous grafts. Mixed plaque of aortic arch. Right carotid system: No evidence of dissection, stenosis (50% or greater) or occlusion. Non stenotic calcific atherosclerosis of carotid bifurcation. Left carotid system: No evidence of dissection, stenosis (50% or greater) or occlusion. Non stenotic calcific atherosclerosis of carotid bifurcation. Vertebral arteries: Codominant. No evidence of dissection, stenosis (50% or greater) or occlusion. Skeleton: Cervical spondylosis with grade 1 anterolisthesis at C2-3 and C3-4, reversal of cervical lordosis, and advanced discogenic degenerative changes at C4-C7. Left-greater-than-right facet arthropathy. Multifactorial at least moderate spinal canal stenosis at C4-C7. Multilevel neural foraminal stenosis. Other neck: Negative. Upper chest: Partially visualized fatty mass within the right paramedian anterior chest wall spanning up to 12 cm, probably lipoma. Review of the MIP images confirms the above findings CTA HEAD FINDINGS Anterior circulation: No significant stenosis, proximal occlusion, aneurysm, or vascular malformation. Non stenotic calcific atherosclerosis of internal carotid arteries. Posterior circulation: No significant stenosis, proximal occlusion, aneurysm, or vascular malformation.  Venous sinuses: As permitted by contrast timing, patent. Anatomic variants: None significant. Review of the MIP images confirms the above findings IMPRESSION: 1. Patent carotid and vertebral arteries. No dissection, aneurysm, or hemodynamically significant stenosis utilizing NASCET criteria. 2. Patent anterior and posterior intracranial circulation. No large vessel occlusion, aneurysm, or significant stenosis. 3. Non stenotic  calcific atherosclerosis of the aorta and carotid systems. 4. Advanced spondylosis of the cervical spine with at least moderate spinal canal stenosis from C4-C7. Electronically Signed   By: Mitzi HansenLance  Furusawa-Stratton M.D.   On: 07/23/2018 19:11   Dg Chest 2 View  Result Date: 07/23/2018 CLINICAL DATA:  Slurred speech. EXAM: CHEST - 2 VIEW COMPARISON:  None. FINDINGS: The heart size and mediastinal contours are within normal limits. Both lungs are clear. The visualized skeletal structures are unremarkable. IMPRESSION: No active cardiopulmonary disease. Electronically Signed   By: Gerome Samavid  Williams III M.D   On: 07/23/2018 20:50   Ct Angio Neck W Or Wo Contrast  Result Date: 07/23/2018 CLINICAL DATA:  78 y/o M; right facial droop and slurred speech, stroke follow-up. EXAM: CT ANGIOGRAPHY HEAD AND NECK TECHNIQUE: Multidetector CT imaging of the head and neck was performed using the standard protocol during bolus administration of intravenous contrast. Multiplanar CT image reconstructions and MIPs were obtained to evaluate the vascular anatomy. Carotid stenosis measurements (when applicable) are obtained utilizing NASCET criteria, using the distal internal carotid diameter as the denominator. CONTRAST:  75 cc Omnipaque 350 COMPARISON:  07/23/2018 CT head. FINDINGS: CTA NECK FINDINGS Aortic arch: Standard branching. Imaged portion shows no evidence of aneurysm or dissection. No significant stenosis of the major arch vessel origins. CABG with LIMA and saphenous grafts. Mixed plaque of aortic arch. Right carotid system: No evidence of dissection, stenosis (50% or greater) or occlusion. Non stenotic calcific atherosclerosis of carotid bifurcation. Left carotid system: No evidence of dissection, stenosis (50% or greater) or occlusion. Non stenotic calcific atherosclerosis of carotid bifurcation. Vertebral arteries: Codominant. No evidence of dissection, stenosis (50% or greater) or occlusion. Skeleton: Cervical  spondylosis with grade 1 anterolisthesis at C2-3 and C3-4, reversal of cervical lordosis, and advanced discogenic degenerative changes at C4-C7. Left-greater-than-right facet arthropathy. Multifactorial at least moderate spinal canal stenosis at C4-C7. Multilevel neural foraminal stenosis. Other neck: Negative. Upper chest: Partially visualized fatty mass within the right paramedian anterior chest wall spanning up to 12 cm, probably lipoma. Review of the MIP images confirms the above findings CTA HEAD FINDINGS Anterior circulation: No significant stenosis, proximal occlusion, aneurysm, or vascular malformation. Non stenotic calcific atherosclerosis of internal carotid arteries. Posterior circulation: No significant stenosis, proximal occlusion, aneurysm, or vascular malformation. Venous sinuses: As permitted by contrast timing, patent. Anatomic variants: None significant. Review of the MIP images confirms the above findings IMPRESSION: 1. Patent carotid and vertebral arteries. No dissection, aneurysm, or hemodynamically significant stenosis utilizing NASCET criteria. 2. Patent anterior and posterior intracranial circulation. No large vessel occlusion, aneurysm, or significant stenosis. 3. Non stenotic calcific atherosclerosis of the aorta and carotid systems. 4. Advanced spondylosis of the cervical spine with at least moderate spinal canal stenosis from C4-C7. Electronically Signed   By: Mitzi HansenLance  Furusawa-Stratton M.D.   On: 07/23/2018 19:11   Mr Brain Wo Contrast  Result Date: 07/23/2018 CLINICAL DATA:  78 y/o M; right-sided weakness, facial droop, and slurred speech. EXAM: MRI HEAD WITHOUT CONTRAST TECHNIQUE: Multiplanar, multiecho pulse sequences of the brain and surrounding structures were obtained without intravenous contrast. COMPARISON:  07/23/2018 CT head and CTA head. FINDINGS: Brain: Multiple scattered  punctate foci of reduced diffusion are present in the left posterior frontal lobe and the parietal lobe  compatible with acute/early subacute infarction. No associated acute hemorrhage or mass effect. Punctate and early confluent nonspecific T2 FLAIR hyperintensities in subcortical and periventricular white matter are compatible with mild to moderate chronic microvascular ischemic changes. Mild volume loss of the brain. Small chronic infarcts are present in the left thalamus, caudate head, and putamen extending into periventricular white matter. Small focus of cortical encephalomalacia of the left anterior inferior frontal lobe. No extra-axial collection, hydrocephalus, or herniation. Vascular: Normal flow voids. Skull and upper cervical spine: Normal marrow signal. Sinuses/Orbits: Mild anterior ethmoid and left maxillary sinus mucosal thickening. Right intra-ocular lens replacement. Trace opacification of right mastoid air cells. Other: None. IMPRESSION: 1. Multiple punctate acute/early subacute infarctions within the left posterior MCA distribution. No associated hemorrhage or mass effect. 2. Mild-to-moderate chronic microvascular ischemic changes and mild volume loss of the brain. Small chronic infarctions in the left basal ganglia. Small area of encephalomalacia of left anterior inferior frontal lobe, possibly chronic infarct or sequelae of traumatic brain injury. These results will be called to the ordering clinician or representative by the Radiologist Assistant, and communication documented in the PACS or zVision Dashboard. Electronically Signed   By: Mitzi Hansen M.D.   On: 07/23/2018 21:30   Ct Head Code Stroke Wo Contrast  Result Date: 07/23/2018 CLINICAL DATA:  Code stroke. Encephalopathy. Right-sided facial droop and slurred speech. Last seen normal 5 p.m. EXAM: CT HEAD WITHOUT CONTRAST TECHNIQUE: Contiguous axial images were obtained from the base of the skull through the vertex without intravenous contrast. COMPARISON:  None. FINDINGS: Brain: There is no mass, hemorrhage or extra-axial  collection. The size and configuration of the ventricles and extra-axial CSF spaces are normal. Areas of hypoattenuation of the deep gray nuclei and confluent periventricular white matter hypodensity, consistent with chronic small vessel disease. Old infarcts of the left basal ganglia and thalamus. Vascular: No abnormal hyperdensity of the major intracranial arteries or dural venous sinuses. No intracranial atherosclerosis. Skull: The visualized skull base, calvarium and extracranial soft tissues are normal. Sinuses/Orbits: No fluid levels or advanced mucosal thickening of the visualized paranasal sinuses. No mastoid or middle ear effusion. The orbits are normal. ASPECTS [facility] Stroke Program Early CT Score) - Ganglionic level infarction (caudate, lentiform nuclei, internal capsule, insula, M1-M3 cortex): 7 - Supraganglionic infarction (M4-M6 cortex): 3 Total score (0-10 with 10 being normal): 10 IMPRESSION: 1. No intracranial hemorrhage. 2. ASPECTS is 10. These results were communicated to Dr. Ritta Slot at 6:47 pm on 07/23/2018 by text page via the [facility] messaging system. Electronically Signed   By: Deatra Robinson M.D.   On: 07/23/2018 18:48   Vas [place] Lower Extremity Venous (dvt)  Result Date: 07/24/2018  Lower Venous Study Indications: Stroke.  Comparison Study: No prior Performing Technologist: Blanch Media RVS  Examination Guidelines: A complete evaluation includes B-mode imaging, spectral Doppler, color Doppler, and power Doppler as needed of all accessible portions of each vessel. Bilateral testing is considered an integral part of a complete examination. Limited examinations for reoccurring indications may be performed as noted.  +---------+---------------+---------+-----------+----------+-------+ RIGHT    CompressibilityPhasicitySpontaneityPropertiesSummary +---------+---------------+---------+-----------+----------+-------+ CFV      Full           Yes      Yes                           +---------+---------------+---------+-----------+----------+-------+ SFJ  Full                                                 +---------+---------------+---------+-----------+----------+-------+ FV Prox  Full                                                 +---------+---------------+---------+-----------+----------+-------+ FV Mid   Full                                                 +---------+---------------+---------+-----------+----------+-------+ FV DistalFull                                                 +---------+---------------+---------+-----------+----------+-------+ PFV      Full                                                 +---------+---------------+---------+-----------+----------+-------+ POP      Full           Yes      Yes                          +---------+---------------+---------+-----------+----------+-------+ PTV      Full                                                 +---------+---------------+---------+-----------+----------+-------+ PERO     Full                                                 +---------+---------------+---------+-----------+----------+-------+   +---------+---------------+---------+-----------+----------+-------+ LEFT     CompressibilityPhasicitySpontaneityPropertiesSummary +---------+---------------+---------+-----------+----------+-------+ CFV      Full           Yes      Yes                          +---------+---------------+---------+-----------+----------+-------+ SFJ      Full                                                 +---------+---------------+---------+-----------+----------+-------+ FV Prox  Full                                                 +---------+---------------+---------+-----------+----------+-------+ FV Mid   Full                                                 +---------+---------------+---------+-----------+----------+-------+  FV  DistalFull                                                 +---------+---------------+---------+-----------+----------+-------+ PFV      Full                                                 +---------+---------------+---------+-----------+----------+-------+ POP      Full           Yes      Yes                          +---------+---------------+---------+-----------+----------+-------+ PTV      Full                                                 +---------+---------------+---------+-----------+----------+-------+ PERO     Full                                                 +---------+---------------+---------+-----------+----------+-------+     Summary: Right: There is no evidence of deep vein thrombosis in the lower extremity. No cystic structure found in the popliteal fossa. Left: There is no evidence of deep vein thrombosis in the lower extremity. A cystic structure is found in the popliteal fossa.  *See table(s) above for measurements and observations. Electronically signed by Monica Martinez MD on 07/24/2018 at 5:12:09 PM.    Final     PHYSICAL EXAM  Temp:  [97.5 F (36.4 C)-98.2 F (36.8 C)] 97.7 F (36.5 C) (07/14 1133) Pulse Rate:  [66-71] 70 (07/14 1133) Resp:  [17-21] 17 (07/14 1133) BP: (100-127)/(51-69) 124/69 (07/14 1133) SpO2:  [93 %-97 %] 95 % (07/14 1133)  General - Well nourished, well developed, in no apparent distress.  Ophthalmologic - fundi not visualized due to noncooperation.  Cardiovascular - Regular rate and rhythm.  Mental Status -  Level of arousal and orientation to time, place, and person were intact. Language including expression, naming, repetition, comprehension was assessed and found intact. Slight dysarthria Fund of Knowledge was assessed and was intact.  Cranial Nerves II - XII - II - Visual field intact OU. III, IV, VI - Extraocular movements intact. V - Facial sensation intact bilaterally. VII - mild right  nasolabial fold flattening. VIII - Hearing & vestibular intact bilaterally. X - Palate elevates symmetrically, slight dysarthria. XI - Chin turning & shoulder shrug intact bilaterally. XII - Tongue protrusion intact.  Motor Strength - The patient's strength was normal in all extremities and pronator drift was absent.  Bulk was normal and fasciculations were absent.   Motor Tone - Muscle tone was assessed at the neck and appendages and was normal.  Reflexes - The patient's reflexes were symmetrical in all extremities and he had no pathological reflexes.  Sensory - Light touch, temperature/pinprick were assessed and were symmetrical.    Coordination - The patient had normal movements in the left hand  with no ataxia or dysmetria, however, mild right FTN dysmetria.  Tremor was absent.  Gait and Station - deferred.   ASSESSMENT/PLAN Mr. Murlean CallerRandall Semrad is a 78 y.o. male with history of HTN, CVA in April 2020 w/ L sided deficits, CAD sp CABG and thyroid disease presenting with R facial droop and surred speech.   Stroke:   Multiple punctate L MCA infarcts embolic secondary to unknown source, concerning for cardioembolic source  Code Stroke CT head No acute abnormality. Old left caudate and thalamic infarcts    CTA head & neck no LVO, no significant stenosis, atherosclerosis, advanced cervical spondylosis C4-7  MRI  Mult punctate L posterior MCA infarcts. L anterior frontal lobe w/ encephalomalacia.  2D Echo EF 50%, normal LA size  LE venous doppler no DVT  Loop recorder placed today   LDL 65  HgbA1c 6.3  SCDs for VTE prophylaxis  clopidogrel 75 mg daily and dipyridamole SR 250 mg/aspirin 25 mg twice a day prior to admission, now on aspirin 81 mg daily and clopidogrel 75 mg daily. Continue ASA and plavix for 3 weeks and then plavix alone  Therapy recommendations:  HH OT, no PT  Disposition: Return home  Follow-up neurology in 4 weeks, stroke clinic  ?? Atrial Fibrillation per IM  note, unable to find in Care Everywhere  Wife stated that pt has heart monitoring 2 years ago for SOB, lethargy and generalized weakness, was told to have some irregular heart beat but not sure if afib, no specific treatment and pt later received cardiac stent  [facility] Dr. Karin LieuBurky and PA Raenette Roveravid Ghroise were not available to confirm . Pt has risk to develop afib including CAD s/p CABG in 2006 and stent 2018, heart monitoring showing irregular heart beat in the past . Pt denies heart palpitation or heart racing  Loop recorder placed today   [place]   Hx stroke/TIA  04/2018 - [place] in [place], with reported L sided deficits with residue - not sure about etiology as per pt and wife  01/2017 TIA  Hypertension  Stable . Permissive hypertension (OK if < 220/120) but gradually normalize in 3-5 days . Long-term BP goal normotensive  Hyperlipidemia  Home meds:  lipitor 80, resumed in hospital  LDL 65, goal < 70  Continue statin at discharge  Hyperglycemia->Prediabetes  HgbA1c 6.5, goal < 7.0  CBGs  SSI  PCP follow up  Other Stroke Risk Factors  Advanced age  Hx ETOH use  Obesity, Body mass index is 37.4 kg/m., recommend weight loss, diet and exercise as appropriate   Coronary artery disease s/p CABG. DES 11/2016 w/ planned DAPT x 1 yr.  Obstructive sleep apnea on CPAP  Hx CHF  Other Active Problems  Hypokalemia  Depression on zoloft  Hypothyroid on synthroid  Hospital day # 1  Neurology will sign off. Please call with questions. Pt [facility] in about 4 weeks. Thanks for the consult.  Marvel PlanJindong Maizie Garno, MD PhD Stroke Neurology 07/25/2018 12:45 PM    To contact Stroke Continuity provider, please refer to [email or web address]. After hours, contact General Neurology

## 2018-07-25 NOTE — TOC Transition Note (Addendum)
Transition of Care [facility]) - CM/SW Discharge Note   Patient Details  Name: Jeanclaude Wentworth MRN: [id number] Date of Birth: February 21, 1940  Transition of Care [facility]) CM/SW Contact:  Pollie Friar, RN Phone Number: 07/25/2018, 12:24 PM   Clinical Narrative:    Pt to d/c home with wife. Wife and patient dont feel they need the 3 in 1. Wife to pick him up a tub seat outside the hospital setting.  TOC signing off.   Final next level of care: Home w Home Health Services Barriers to Discharge: No Barriers Identified   Patient Goals and CMS Choice   CMS Medicare.gov Compare Post Acute Care list provided to:: Patient Represenative (must comment) Choice offered to / list presented to : Spouse  Discharge Placement                       Discharge Plan and Services   Discharge Planning Services: CM Consult Post Acute Care Choice: Home Health                    HH Arranged: PT, OT [facility] Agency: [place] Date [place]: 07/25/18   Representative spoke with at [place]: [place] ([place] Interventions     Readmission Risk Interventions No flowsheet data found.

## 2018-07-25 NOTE — Discharge Summary (Signed)
Physician Discharge Summary  Luke Mckee WGN:[id number] DOB: [birth year] DOA: 07/23/2018  PCP: [name], DO  Admit date: 07/23/2018 Discharge date: 07/25/2018  Time spent: 45 minutes  Recommendations for Outpatient Follow-up:  1. Follow up with neurology 3-4 weeks for evaluation of symptoms and loop recorder data 2. Follow up PCP 1-2 weeks. Recommend bmet to track potassium level   Discharge Diagnoses:  Principal Problem:   Stroke [facility]) Active Problems:   Benign essential HTN   History of cerebrovascular accident (CVA) with residual deficit   Mixed hyperlipidemia   Hypokalemia   Anxiety   Discharge Condition: stable  Diet recommendation: heart healthy  There were no vitals filed for this visit.  History of present illness:  Luke Mckee  is a 78 y.o. male,  w hypothyroidism, hypertension, CAD s/p CABG(2005)/ DES stentx2 (2018), CHF(EF 55%), Pafib (in Epic), , h/o CVA with left hemiplegia , OSA, COPD, mild pulmonary hypertension,  ?CKD, Anxiety/depression apparently presented 7/12 with c/o right arm weakness, numbness, right facial droop and slurred speech starting at 5pm.  Pt currently taking plavix and aggrenox. In ED Seen by neurology, not a TPA candidate, pt declines TPA. Workup revealed stroke  Hospital Course:  CVA. Patient presented with right arm weakness, facial droop, Slurred speech. No events on tele, CT head no acute abnormalities. MRI with multiple punctate L MCA infarcts embolic secondary to known AF not on AC, Carotid doppler B ICA 1-39% stenosis, VAs antegrade, echo with EF 50%,DL 65, HQI6N 6.3, evaluated by OT recommended HH, evaluated PT recommended no HH, speech recommends no further therapy.  Neuro recommended loop recorder that was placed 7/14. Also recommended Aspirin + Plavix for 3 weeks then just plavix  PAfib [place] vasc score 7 Noted in records in care everywhere. ekg with SR. Per  Neurology, from stroke prevention standpoint no need both  aggrenox and plavix. - aspirin / plavix  Hypertension, Hyperlipidemia, CAD s/p CABG, DES, CHF. Fair control BP. Home meds include Atenolol  Furosemide  Spironolactone  Imdur. Held lasix and BB. Will resume meds at discharge.   Hypokalemia potassium 3.3. repleted and resolved.    Hypothyroidism TSH 2.0  Depression stable at baseline.Cont Zoloft  po qday Cont Lorazepam prn   OSA on CPAP  Hyperglycemia serum glucose 103 today. HgA1c 6.3.    Procedures:  Echo  Loop recorder placement  Consultations:  Dr Roda Shutters neurology  Discharge Exam: Vitals:   07/25/18 0811 07/25/18 1133  BP: 127/68 124/69  Pulse: 68 70  Resp: 20 17  Temp: 97.6 F (36.4 C) 97.7 F (36.5 C)  SpO2: 97% 95%    General: awake alert no acute distress Cardiovascular: rrr no mgr no LE edema Respiratory: normal effort BS clear bilaterally no  Discharge Instructions   Discharge Instructions    Call MD for:  difficulty breathing, headache or visual disturbances   Complete by: As directed    Call MD for:  persistant dizziness or light-headedness   Complete by: As directed    Diet - low sodium heart healthy   Complete by: As directed    Discharge instructions   Complete by: As directed    Take medications as prescribed.  Follow up with neurology 3-4 weeks   Increase activity slowly   Complete by: As directed      Allergies as of 07/25/2018      Reactions   Ace Inhibitors    Crestor [rosuvastatin Calcium] Other (See Comments)   myalgia   Tape Rash  blistering      Medication List    STOP taking these medications   dipyridamole-aspirin 200-25 MG 12hr capsule Commonly known as: Aggrenox     TAKE these medications   aspirin 81 MG EC tablet Take 1 tablet (81 mg total) by mouth daily for 21 days. Start taking on: July 26, 2018   atenolol 25 MG tablet Commonly known as: TENORMIN Take 25 mg by mouth daily. Taking with  tablet =  daily   atenolol 50 MG tablet Commonly  known as: TENORMIN Take 1 tablet (50 mg total) by mouth every evening.   atorvastatin 80 MG tablet Commonly known as: LIPITOR TAKE 1 TABLET BY MOUTH DAILY What changed: when to take this   cetirizine 10 MG tablet Commonly known as: ZYRTEC Take 10 mg by mouth at bedtime as needed for allergies.   clopidogrel 75 MG tablet Commonly known as: PLAVIX Take 1 tablet (75 mg total) by mouth daily.   diclofenac sodium 1 % Gel Commonly known as: VOLTAREN Apply 2 g topically 4 (four) times daily as needed for pain. Foot pain   docusate sodium 100 MG capsule Commonly known as: COLACE Take 100 mg by mouth daily as needed for constipation.   furosemide 80 MG tablet Commonly known as: LASIX Take 1 tablet (80 mg total) by mouth 2 (two) times daily.   isosorbide mononitrate 30 MG 24 hr tablet Commonly known as: IMDUR TAKE 1 TABLET BY MOUTH DAILY   levothyroxine 112 MCG tablet Commonly known as: SYNTHROID TAKE 1 TABLET BY MOUTH DAILY( BEFORE BREAKFAST) What changed: See the new instructions.   LORazepam 0.5 MG tablet Commonly known as: ATIVAN Take 1 tablet (0.5 mg total) by mouth daily as needed for anxiety or sleep.   multivitamin tablet Take 1 tablet by mouth daily.   sertraline 100 MG tablet Commonly known as: ZOLOFT Take 1.5 tablets (150 mg total) by mouth daily.   spironolactone 25 MG tablet Commonly known as: ALDACTONE TAKE 1/2 TABLET BY MOUTH DAILY   Tylenol 8 Hour 650 MG CR tablet Generic drug: acetaminophen Take 1,300 mg by mouth once.   Vitamin D 50 MCG (2000 UT) tablet Take 4,000 Units by mouth daily.            Durable Medical Equipment  (From admission, onward)         Start     Ordered   07/25/18 1144  For home use only DME Bedside commode  Once    Question:  Patient needs a bedside commode to treat with the following condition  Answer:  Stroke [facility])   07/25/18 1143         Allergies  Allergen Reactions  . Ace Inhibitors   . Crestor  [Rosuvastatin Calcium] Other (See Comments)    myalgia  . Tape Rash    blistering      The results of significant diagnostics from this hospitalization (including imaging, microbiology, ancillary and laboratory) are listed below for reference.    Significant Diagnostic Studies: Ct Angio Head W Or Wo Contrast  Result Date: 07/23/2018 CLINICAL DATA:  78 y/o M; right facial droop and slurred speech, stroke follow-up. EXAM: CT ANGIOGRAPHY HEAD AND NECK TECHNIQUE: Multidetector CT imaging of the head and neck was performed using the standard protocol during bolus administration of intravenous contrast. Multiplanar CT image reconstructions and MIPs were obtained to evaluate the vascular anatomy. Carotid stenosis measurements (when applicable) are obtained utilizing NASCET criteria, using the distal internal carotid diameter as the  denominator. CONTRAST:  75 cc Omnipaque 350 COMPARISON:  07/23/2018 CT head. FINDINGS: CTA NECK FINDINGS Aortic arch: Standard branching. Imaged portion shows no evidence of aneurysm or dissection. No significant stenosis of the major arch vessel origins. CABG with LIMA and saphenous grafts. Mixed plaque of aortic arch. Right carotid system: No evidence of dissection, stenosis (50% or greater) or occlusion. Non stenotic calcific atherosclerosis of carotid bifurcation. Left carotid system: No evidence of dissection, stenosis (50% or greater) or occlusion. Non stenotic calcific atherosclerosis of carotid bifurcation. Vertebral arteries: Codominant. No evidence of dissection, stenosis (50% or greater) or occlusion. Skeleton: Cervical spondylosis with grade 1 anterolisthesis at C2-3 and C3-4, reversal of cervical lordosis, and advanced discogenic degenerative changes at C4-C7. Left-greater-than-right facet arthropathy. Multifactorial at least moderate spinal canal stenosis at C4-C7. Multilevel neural foraminal stenosis. Other neck: Negative. Upper chest: Partially visualized fatty mass  within the right paramedian anterior chest wall spanning up to 12 cm, probably lipoma. Review of the MIP images confirms the above findings CTA HEAD FINDINGS Anterior circulation: No significant stenosis, proximal occlusion, aneurysm, or vascular malformation. Non stenotic calcific atherosclerosis of internal carotid arteries. Posterior circulation: No significant stenosis, proximal occlusion, aneurysm, or vascular malformation. Venous sinuses: As permitted by contrast timing, patent. Anatomic variants: None significant. Review of the MIP images confirms the above findings IMPRESSION: 1. Patent carotid and vertebral arteries. No dissection, aneurysm, or hemodynamically significant stenosis utilizing NASCET criteria. 2. Patent anterior and posterior intracranial circulation. No large vessel occlusion, aneurysm, or significant stenosis. 3. Non stenotic calcific atherosclerosis of the aorta and carotid systems. 4. Advanced spondylosis of the cervical spine with at least moderate spinal canal stenosis from C4-C7. Electronically Signed   By: Kristine Garbe M.D.   On: 07/23/2018 19:11   Dg Chest 2 View  Result Date: 07/23/2018 CLINICAL DATA:  Slurred speech. EXAM: CHEST - 2 VIEW COMPARISON:  None. FINDINGS: The heart size and mediastinal contours are within normal limits. Both lungs are clear. The visualized skeletal structures are unremarkable. IMPRESSION: No active cardiopulmonary disease. Electronically Signed   By: Dorise Bullion III M.D   On: 07/23/2018 20:50   Ct Angio Neck W Or Wo Contrast  Result Date: 07/23/2018 CLINICAL DATA:  78 y/o M; right facial droop and slurred speech, stroke follow-up. EXAM: CT ANGIOGRAPHY HEAD AND NECK TECHNIQUE: Multidetector CT imaging of the head and neck was performed using the standard protocol during bolus administration of intravenous contrast. Multiplanar CT image reconstructions and MIPs were obtained to evaluate the vascular anatomy. Carotid stenosis  measurements (when applicable) are obtained utilizing NASCET criteria, using the distal internal carotid diameter as the denominator. CONTRAST:  75 cc Omnipaque 350 COMPARISON:  07/23/2018 CT head. FINDINGS: CTA NECK FINDINGS Aortic arch: Standard branching. Imaged portion shows no evidence of aneurysm or dissection. No significant stenosis of the major arch vessel origins. CABG with LIMA and saphenous grafts. Mixed plaque of aortic arch. Right carotid system: No evidence of dissection, stenosis (50% or greater) or occlusion. Non stenotic calcific atherosclerosis of carotid bifurcation. Left carotid system: No evidence of dissection, stenosis (50% or greater) or occlusion. Non stenotic calcific atherosclerosis of carotid bifurcation. Vertebral arteries: Codominant. No evidence of dissection, stenosis (50% or greater) or occlusion. Skeleton: Cervical spondylosis with grade 1 anterolisthesis at C2-3 and C3-4, reversal of cervical lordosis, and advanced discogenic degenerative changes at C4-C7. Left-greater-than-right facet arthropathy. Multifactorial at least moderate spinal canal stenosis at C4-C7. Multilevel neural foraminal stenosis. Other neck: Negative. Upper chest: Partially visualized fatty mass within  the right paramedian anterior chest wall spanning up to 12 cm, probably lipoma. Review of the MIP images confirms the above findings CTA HEAD FINDINGS Anterior circulation: No significant stenosis, proximal occlusion, aneurysm, or vascular malformation. Non stenotic calcific atherosclerosis of internal carotid arteries. Posterior circulation: No significant stenosis, proximal occlusion, aneurysm, or vascular malformation. Venous sinuses: As permitted by contrast timing, patent. Anatomic variants: None significant. Review of the MIP images confirms the above findings IMPRESSION: 1. Patent carotid and vertebral arteries. No dissection, aneurysm, or hemodynamically significant stenosis utilizing NASCET criteria. 2.  Patent anterior and posterior intracranial circulation. No large vessel occlusion, aneurysm, or significant stenosis. 3. Non stenotic calcific atherosclerosis of the aorta and carotid systems. 4. Advanced spondylosis of the cervical spine with at least moderate spinal canal stenosis from C4-C7. Electronically Signed   By: Mitzi HansenLance  Furusawa-Stratton M.D.   On: 07/23/2018 19:11   Mr Brain Wo Contrast  Result Date: 07/23/2018 CLINICAL DATA:  78 y/o M; right-sided weakness, facial droop, and slurred speech. EXAM: MRI HEAD WITHOUT CONTRAST TECHNIQUE: Multiplanar, multiecho pulse sequences of the brain and surrounding structures were obtained without intravenous contrast. COMPARISON:  07/23/2018 CT head and CTA head. FINDINGS: Brain: Multiple scattered punctate foci of reduced diffusion are present in the left posterior frontal lobe and the parietal lobe compatible with acute/early subacute infarction. No associated acute hemorrhage or mass effect. Punctate and early confluent nonspecific T2 FLAIR hyperintensities in subcortical and periventricular white matter are compatible with mild to moderate chronic microvascular ischemic changes. Mild volume loss of the brain. Small chronic infarcts are present in the left thalamus, caudate head, and putamen extending into periventricular white matter. Small focus of cortical encephalomalacia of the left anterior inferior frontal lobe. No extra-axial collection, hydrocephalus, or herniation. Vascular: Normal flow voids. Skull and upper cervical spine: Normal marrow signal. Sinuses/Orbits: Mild anterior ethmoid and left maxillary sinus mucosal thickening. Right intra-ocular lens replacement. Trace opacification of right mastoid air cells. Other: None. IMPRESSION: 1. Multiple punctate acute/early subacute infarctions within the left posterior MCA distribution. No associated hemorrhage or mass effect. 2. Mild-to-moderate chronic microvascular ischemic changes and mild volume loss of  the brain. Small chronic infarctions in the left basal ganglia. Small area of encephalomalacia of left anterior inferior frontal lobe, possibly chronic infarct or sequelae of traumatic brain injury. These results will be called to the ordering clinician or representative by the Radiologist Assistant, and communication documented in the PACS or zVision Dashboard. Electronically Signed   By: Mitzi HansenLance  Furusawa-Stratton M.D.   On: 07/23/2018 21:30   Ct Head Code Stroke Wo Contrast  Result Date: 07/23/2018 CLINICAL DATA:  Code stroke. Encephalopathy. Right-sided facial droop and slurred speech. Last seen normal 5 p.m. EXAM: CT HEAD WITHOUT CONTRAST TECHNIQUE: Contiguous axial images were obtained from the base of the skull through the vertex without intravenous contrast. COMPARISON:  None. FINDINGS: Brain: There is no mass, hemorrhage or extra-axial collection. The size and configuration of the ventricles and extra-axial CSF spaces are normal. Areas of hypoattenuation of the deep gray nuclei and confluent periventricular white matter hypodensity, consistent with chronic small vessel disease. Old infarcts of the left basal ganglia and thalamus. Vascular: No abnormal hyperdensity of the major intracranial arteries or dural venous sinuses. No intracranial atherosclerosis. Skull: The visualized skull base, calvarium and extracranial soft tissues are normal. Sinuses/Orbits: No fluid levels or advanced mucosal thickening of the visualized paranasal sinuses. No mastoid or middle ear effusion. The orbits are normal. ASPECTS [facility]([place] Stroke Program Early CT Score) -  Ganglionic level infarction (caudate, lentiform nuclei, internal capsule, insula, M1-M3 cortex): 7 - Supraganglionic infarction (M4-M6 cortex): 3 Total score (0-10 with 10 being normal): 10 IMPRESSION: 1. No intracranial hemorrhage. 2. ASPECTS is 10. These results were communicated to Dr. Ritta SlotMcNeill Kirkpatrick at 6:47 pm on 07/23/2018 by text page via the [employer]  messaging system. Electronically Signed   By: Deatra RobinsonKevin  Herman M.D.   On: 07/23/2018 18:48   Vas [place] Lower Extremity Venous (dvt)  Result Date: 07/24/2018  Lower Venous Study Indications: Stroke.  Comparison Study: No prior Performing Technologist: Blanch MediaMegan Riddle RVS  Examination Guidelines: A complete evaluation includes B-mode imaging, spectral Doppler, color Doppler, and power Doppler as needed of all accessible portions of each vessel. Bilateral testing is considered an integral part of a complete examination. Limited examinations for reoccurring indications may be performed as noted.  +---------+---------------+---------+-----------+----------+-------+ RIGHT    CompressibilityPhasicitySpontaneityPropertiesSummary +---------+---------------+---------+-----------+----------+-------+ CFV      Full           Yes      Yes                          +---------+---------------+---------+-----------+----------+-------+ SFJ      Full                                                 +---------+---------------+---------+-----------+----------+-------+ FV Prox  Full                                                 +---------+---------------+---------+-----------+----------+-------+ FV Mid   Full                                                 +---------+---------------+---------+-----------+----------+-------+ FV DistalFull                                                 +---------+---------------+---------+-----------+----------+-------+ PFV      Full                                                 +---------+---------------+---------+-----------+----------+-------+ POP      Full           Yes      Yes                          +---------+---------------+---------+-----------+----------+-------+ PTV      Full                                                 +---------+---------------+---------+-----------+----------+-------+ PERO     Full                                                  +---------+---------------+---------+-----------+----------+-------+   +---------+---------------+---------+-----------+----------+-------+  LEFT     CompressibilityPhasicitySpontaneityPropertiesSummary +---------+---------------+---------+-----------+----------+-------+ CFV      Full           Yes      Yes                          +---------+---------------+---------+-----------+----------+-------+ SFJ      Full                                                 +---------+---------------+---------+-----------+----------+-------+ FV Prox  Full                                                 +---------+---------------+---------+-----------+----------+-------+ FV Mid   Full                                                 +---------+---------------+---------+-----------+----------+-------+ FV DistalFull                                                 +---------+---------------+---------+-----------+----------+-------+ PFV      Full                                                 +---------+---------------+---------+-----------+----------+-------+ POP      Full           Yes      Yes                          +---------+---------------+---------+-----------+----------+-------+ PTV      Full                                                 +---------+---------------+---------+-----------+----------+-------+ PERO     Full                                                 +---------+---------------+---------+-----------+----------+-------+     Summary: Right: There is no evidence of deep vein thrombosis in the lower extremity. No cystic structure found in the popliteal fossa. Left: There is no evidence of deep vein thrombosis in the lower extremity. A cystic structure is found in the popliteal fossa.  *See table(s) above for measurements and observations. Electronically signed by Sherald Hess MD on 07/24/2018 at 5:12:09 PM.    Final      Microbiology: Recent Results (from the past 240 hour(s))  SARS Coronavirus 2 (CEPHEID - Performed in [facility] hospital lab), Hosp Order     Status: None   Collection Time: 07/23/18  9:38 PM  Specimen: Nasopharyngeal Swab  Result Value Ref Range Status   SARS Coronavirus 2 NEGATIVE NEGATIVE Final    Comment: (NOTE) If result is NEGATIVE SARS-CoV-2 target nucleic acids are NOT DETECTED. The SARS-CoV-2 RNA is generally detectable in upper and lower  respiratory specimens during the acute phase of infection. The lowest  concentration of SARS-CoV-2 viral copies this assay can detect is 250  copies / mL. A negative result does not preclude SARS-CoV-2 infection  and should not be used as the sole basis for treatment or other  patient management decisions.  A negative result may occur with  improper specimen collection / handling, submission of specimen other  than nasopharyngeal swab, presence of viral mutation(s) within the  areas targeted by this assay, and inadequate number of viral copies  (<250 copies / mL). A negative result must be combined with clinical  observations, patient history, and epidemiological information. If result is POSITIVE SARS-CoV-2 target nucleic acids are DETECTED. The SARS-CoV-2 RNA is generally detectable in upper and lower  respiratory specimens dur ing the acute phase of infection.  Positive  results are indicative of active infection with SARS-CoV-2.  Clinical  correlation with patient history and other diagnostic information is  necessary to determine patient infection status.  Positive results do  not rule out bacterial infection or co-infection with other viruses. If result is PRESUMPTIVE POSTIVE SARS-CoV-2 nucleic acids MAY BE PRESENT.   A presumptive positive result was obtained on the submitted specimen  and confirmed on repeat testing.  While 2019 novel coronavirus  (SARS-CoV-2) nucleic acids may be present in the submitted sample   additional confirmatory testing may be necessary for epidemiological  and / or clinical management purposes  to differentiate between  SARS-CoV-2 and other Sarbecovirus currently known to infect humans.  If clinically indicated additional testing with an alternate test  methodology [phone]([id number]) is advised. The SARS-CoV-2 RNA is generally  detectable in upper and lower respiratory sp ecimens during the acute  phase of infection. The expected result is Negative. Fact Sheet for Patients:  BoilerBrush.com.cyhttps://www.fda.gov/media/136312/download Fact Sheet for Healthcare Providers: https://pope.com/https://www.fda.gov/media/136313/download This test is not yet approved or cleared by the [place] FDA and has been authorized for detection and/or diagnosis of SARS-CoV-2 by FDA under an Emergency Use Authorization (EUA).  This EUA will remain in effect (meaning this test can be used) for the duration of the COVID-19 declaration under Section 564(b)(1) of the Act, 21 U.S.C. section 360bbb-3(b)(1), unless the authorization is terminated or revoked sooner. Performed at [facility], [address] [place], [place] [address]      Labs: Basic Metabolic Panel: Recent Labs  Lab 07/23/18 1835 07/24/18 0458 07/24/18 1440 07/25/18 0454  NA 140  141 140  --  140  K 3.2*  3.2* 3.3*  --  4.0  CL 103  102 104  --  106  CO2 26 26  --  26  GLUCOSE 123*  122* 101*  --  106*  BUN 16  17 12   --  14  CREATININE 0.98  0.90 0.83  --  0.84  CALCIUM 8.9 8.5*  --  9.0  MG  --   --  2.2  --    Liver Function Tests: Recent Labs  Lab 07/23/18 1835 07/24/18 0458  AST 21 17  ALT 20 17  ALKPHOS 54 47  BILITOT 0.7 0.8  PROT 7.3 6.6  ALBUMIN 3.8 3.4*   No results for input(s): LIPASE, AMYLASE in the last 168 hours.  No results for input(s): AMMONIA in the last 168 hours. CBC: Recent Labs  Lab 07/23/18 1835 07/24/18 0458  WBC 7.0 7.3  NEUTROABS 3.8  --   HGB 13.4  13.9 12.6*  HCT 40.3  41.0 37.1*  MCV 96.4  94.2  PLT 210 201   Cardiac Enzymes: No results for input(s): CKTOTAL, CKMB, CKMBINDEX, TROPONINI in the last 168 hours. BNP: BNP (last 3 results) No results for input(s): BNP in the last 8760 hours.  ProBNP (last 3 results) No results for input(s): PROBNP in the last 8760 hours.  CBG: No results for input(s): GLUCAP in the last 168 hours.     SignedGwenyth Bender NP [facility] 07/25/2018, 12:06 PM

## 2018-07-25 NOTE — Discharge Instructions (Signed)
Post implant site/wound care instructions °Keep incision clean and dry for 3 days. °You can remove outer dressing tomorrow. °Leave steri-strips (little pieces of tape) on until seen in the office for wound check appointment. °Call the office ([phone]) for redness, drainage, swelling, or fever. ° °

## 2018-07-25 NOTE — TOC Initial Note (Signed)
Transition of Care [facility]) - Initial/Assessment Note    Patient Details  Name: Luke Mckee MRN: [id number] Date of Birth: 1940-04-05  Transition of Care [facility]) CM/SW Contact:    Pollie Friar, RN Phone Number: 07/25/2018, 12:23 PM  Clinical Narrative:                 Wife is a Therapist, sports and sets up pts medications and is able to provide needed supervision.  Cory with [facility] accepted the [facility] referral.  Pt has transportation home when d/ced.   Expected Discharge Plan: [place] Barriers to Discharge: No Barriers Identified   Patient Goals and CMS Choice   CMS Medicare.gov Compare Post Acute Care list provided to:: Patient Represenative (must comment) Choice offered to / list presented to : Spouse  Expected Discharge Plan and Services Expected Discharge Plan: [place]   Discharge Planning Services: CM Consult Post Acute Care Choice: [place] arrangements for the past 2 months: Single Family Home Expected Discharge Date: 07/25/18                         HH Arranged: PT, OT HH Agency: [place] Date [facility] Agency Contacted: 07/25/18   Representative spoke with at [place]: Tommi Rumps  Prior Living Arrangements/Services Living arrangements for the past 2 months: [place] Lives with:: Spouse Patient language and need for interpreter reviewed:: Yes(no needs) Do you feel safe going back to the place where you live?: Yes      Need for Family Participation in Patient Care: Yes (Comment)(intermittent supervision) Care giver support system in place?: Yes (comment)(wife able to provide needed supervision) Current home services: DME(walker and cane) Criminal Activity/Legal Involvement Pertinent to Current Situation/Hospitalization: No - Comment as needed  Activities of Daily Living      Permission Sought/Granted                  Emotional Assessment Appearance:: Appears stated age Attitude/Demeanor/Rapport:  Engaged Affect (typically observed): Accepting Orientation: : Oriented to Self, Oriented to Place, Oriented to  Time, Oriented to Situation   Psych Involvement: No (comment)  Admission diagnosis:  Slurred speech [R47.81] Cerebrovascular accident (CVA), unspecified mechanism ([place]) [I63.9] Patient Active Problem List   Diagnosis Date Noted  . Stroke ([place]) 07/24/2018  . TIA (transient ischemic attack) 07/23/2018  . Slurred speech 07/23/2018  . Right arm weakness 07/23/2018  . Hypokalemia 07/23/2018  . Hemiplegia of left nondominant side due to cerebrovascular disease ([place]) 07/20/2018  . Anxiety 07/20/2018  . Atherosclerosis of native coronary artery of native heart with stable angina pectoris ([place]) 06/16/2018  . Hx of CABG 06/16/2018  . Mixed hyperlipidemia 06/16/2018  . Benign essential HTN 06/16/2018  . History of cerebrovascular accident (CVA) with residual deficit 06/07/2018   PCP:  Olin Hauser, DO Pharmacy:   [place], [address] [facility] [address] Phone: [phone] Fax: [phone]  [facility] - [address], [address] [place] [address] Phone: [phone] Fax: [phone]     Social Determinants of Health (SDOH) Interventions    Readmission Risk Interventions No flowsheet data found.

## 2018-07-25 NOTE — Consult Note (Addendum)
ELECTROPHYSIOLOGY CONSULT NOTE  Patient ID: Luke Mckee MRN: [id number], DOB/AGE: 77/04/1940   Admit date: 07/23/2018 Date of Consult: 07/25/2018  Primary Physician: Smitty CordsKaramalegos, Alexander J, DO Primary Cardiologist: Dr. Mariah MillingGollan Reason for Consultation: Cryptogenic stroke -; recommendations regarding Implantable Loop Recorder, requested by dr. Roda ShuttersXu  History of Present Illness Luke CallerRandall Haffey was admitted on 07/23/2018 with difficult speech and L hand weakness.  He first developed symptoms while at home.  PMHx includes, HTN, HLD,  Hypothyroidism, CAD (remote CABG, PCI 2-3 years ago), prior stroke, COPD, mild p.HTN.  There is mention of PAFib in the H&P, though we are unable to track this down in Epic, the patient (and his wife via telephone) deny any known h/o AFib, are unfamiliar with the term, he has never been on a/c. Neurology notes Multiple punctate L MCA infarcts embolic secondary to unknown source, concerning for cardioembolic source, and requests loop implant for this patient.  he has undergone workup for stroke including echocardiogram and carotid angio.  The patient has been monitored on telemetry which has demonstrated sinus rhythm with no arrhythmias. No TEE planned as per neurology.  The patient has had negative LE dopplers   Echocardiogram this admission demonstrated IMPRESSIONS    1. The left ventricle has a visually estimated ejection fraction of 50%. The cavity size was normal. Left ventricular diastolic Doppler parameters are consistent with impaired relaxation. Mild septal hypokinesis.  2. Normal RV size with mildly decreased RV systolic function.  3. Right atrial size was mildly dilated.  4. Trivial pericardial effusion is present.  5. Moderate calcification of the mitral valve leaflet. No evidence of mitral valve stenosis. No significant mitral regurgitation.  6. The aortic valve is tricuspid. Mild calcification of the aortic valve. Aortic valve regurgitation is trivial by  color flow Doppler.  7. There is mild dilatation of the aortic root measuring 37 mm.  8. The IVC was normal in size. PA systolic pressure 25 mmHg.  FINDINGS  Left Ventricle: The left ventricle has a visually estimated ejection fraction of 50. The cavity size was normal. There is no increase in left ventricular wall thickness. Left ventricular diastolic Doppler parameters are consistent with impaired  relaxation. Definity contrast agent was given IV to delineate the left ventricular endocardial borders.  Right Ventricle: The right ventricle has mildly reduced systolic function. The cavity was normal. There is no increase in right ventricular wall thickness.  Left Atrium: Left atrial size was normal in size.  Right Atrium: Right atrial size was mildly dilated.  Interatrial Septum: No atrial level shunt detected by color flow Doppler.  Pericardium: Trivial pericardial effusion is present.  Mitral Valve: The mitral valve is normal in structure. Moderate calcification of the mitral valve leaflet. Mitral valve regurgitation is not visualized by color flow Doppler. No evidence of mitral valve stenosis.  Tricuspid Valve: The tricuspid valve is normal in structure. Tricuspid valve regurgitation is trivial by color flow Doppler.  Aortic Valve: The aortic valve is tricuspid Mild calcification of the aortic valve. Aortic valve regurgitation is trivial by color flow Doppler.  Pulmonic Valve: The pulmonic valve was grossly normal. Pulmonic valve regurgitation is not visualized by color flow Doppler.  Aorta: There is mild dilatation of the aortic root measuring 37 mm.  Venous: The inferior vena cava is normal in size with greater than 50% respiratory variability.    Lab work is reviewed.  Prior to admission, the patient denies chest pain, shortness of breath, dizziness, palpitations, or syncope.  They are  recovering from their stroke with plans to home at discharge.   Past Medical  History:  Diagnosis Date   Anxiety    Arthritis    CAD (coronary artery disease) 2005   s/p CABG, DES   CHF (congestive heart failure) (HCC)    in EPIC care everywhere   Chronic kidney disease    COPD (chronic obstructive pulmonary disease) (HCC)    in EPIC care everywhere   Depression    Heart murmur    History of stroke    Hypothyroidism    OSA (obstructive sleep apnea)    Paroxysmal A-fib (HCC)    in EPIC careeverywhere     Surgical History:  Past Surgical History:  Procedure Laterality Date   APPENDECTOMY     BUNIONECTOMY     CATARACT EXTRACTION     Right eye   COLONOSCOPY  07/06/2004   CORONARY ARTERY BYPASS GRAFT  2005   TOTAL HIP ARTHROPLASTY Right 11/23/2011   TOTAL HIP ARTHROPLASTY Left 09/07/2011   TOTAL KNEE ARTHROPLASTY Right 02/15/2018   VASECTOMY  1978     Medications Prior to Admission  Medication Sig Dispense Refill Last Dose   acetaminophen (TYLENOL 8 HOUR) 650 MG CR tablet Take 1,300 mg by mouth once.   07/23/2018 at Unknown time   atenolol (TENORMIN) 25 MG tablet Take 25 mg by mouth daily. Taking with 50mg  tablet = 75mg  daily   07/22/2018 at 2200   atenolol (TENORMIN) 50 MG tablet Take 1 tablet (50 mg total) by mouth every evening. 90 tablet 3 07/22/2018 at 2200   atorvastatin (LIPITOR) 80 MG tablet TAKE 1 TABLET BY MOUTH DAILY (Patient taking differently: Take 80 mg by mouth. ) 90 tablet 1 07/23/2018 at Unknown time   cetirizine (ZYRTEC) 10 MG tablet Take 10 mg by mouth at bedtime as needed for allergies.    Past Week at Unknown time   Cholecalciferol (VITAMIN D) 50 MCG (2000 UT) tablet Take 4,000 Units by mouth daily.   07/23/2018 at Unknown time   clopidogrel (PLAVIX) 75 MG tablet Take 1 tablet (75 mg total) by mouth daily. 90 tablet 3 07/23/2018 at 0830   diclofenac sodium (VOLTAREN) 1 % GEL Apply 2 g topically 4 (four) times daily as needed for pain. Foot pain   Past Week at Unknown time   dipyridamole-aspirin (AGGRENOX)  200-25 MG 12hr capsule Take 1 capsule by mouth 2 (two) times daily. 60 capsule 1 07/23/2018 at 0830   docusate sodium (COLACE) 100 MG capsule Take 100 mg by mouth daily as needed for constipation.   07/22/2018   furosemide (LASIX) 80 MG tablet Take 1 tablet (80 mg total) by mouth 2 (two) times daily. 180 tablet 1 07/23/2018 at Unknown time   isosorbide mononitrate (IMDUR) 30 MG 24 hr tablet TAKE 1 TABLET BY MOUTH DAILY (Patient taking differently: Take 30 mg by mouth daily. ) 90 tablet 1 07/23/2018 at Unknown time   levothyroxine (SYNTHROID) 112 MCG tablet TAKE 1 TABLET BY MOUTH DAILY( BEFORE BREAKFAST) (Patient taking differently: Take 112 mcg by mouth daily before breakfast. ) 90 tablet 1 07/23/2018 at Unknown time   LORazepam (ATIVAN) 0.5 MG tablet Take 1 tablet (0.5 mg total) by mouth daily as needed for anxiety or sleep. 30 tablet 0 Past Week at Unknown time   Multiple Vitamin (MULTIVITAMIN) tablet Take 1 tablet by mouth daily.   07/23/2018 at Unknown time   sertraline (ZOLOFT) 100 MG tablet Take 1.5 tablets (150 mg total) by mouth daily. 45  tablet 1 07/23/2018 at Unknown time   spironolactone (ALDACTONE) 25 MG tablet TAKE 1/2 TABLET BY MOUTH DAILY (Patient taking differently: Take 12.5 mg by mouth daily. ) 45 tablet 1 07/23/2018 at Unknown time    Inpatient Medications:   aspirin EC  81 mg Oral Daily   atorvastatin  80 mg Oral Daily   clopidogrel  75 mg Oral Daily   isosorbide mononitrate  30 mg Oral Daily   levothyroxine  112 mcg Oral Q0600   loratadine  10 mg Oral Daily   sertraline  150 mg Oral Daily   spironolactone  12.5 mg Oral Daily    Allergies:  Allergies  Allergen Reactions   Ace Inhibitors    Crestor [Rosuvastatin Calcium] Other (See Comments)    myalgia   Tape Rash    blistering    Social History   Socioeconomic History   Marital status: Married    Spouse name: Not on file   Number of children: Not on file   Years of education: Not on file    Highest education level: Not on file  Occupational History   Not on file  Social Needs   Financial resource strain: Not on file   Food insecurity    Worry: Not on file    Inability: Not on file   Transportation needs    Medical: Not on file    Non-medical: Not on file  Tobacco Use   Smoking status: Never Smoker   Smokeless tobacco: Never Used  Substance and Sexual Activity   Alcohol use: Not Currently    Comment: past   Drug use: Never   Sexual activity: Not on file  Lifestyle   Physical activity    Days per week: Not on file    Minutes per session: Not on file   Stress: Not on file  Relationships   Social connections    Talks on phone: Not on file    Gets together: Not on file    Attends religious service: Not on file    Active member of club or organization: Not on file    Attends meetings of clubs or organizations: Not on file    Relationship status: Not on file   Intimate partner violence    Fear of current or ex partner: Not on file    Emotionally abused: Not on file    Physically abused: Not on file    Forced sexual activity: Not on file  Other Topics Concern   Not on file  Social History Narrative   Not on file     Family History  Problem Relation Age of Onset   Anxiety disorder Sister       Review of Systems: All other systems reviewed and are otherwise negative except as noted above.  Physical Exam: Vitals:   07/24/18 2043 07/25/18 0020 07/25/18 0344 07/25/18 0811  BP: 113/65 (!) 116/51 (!) 123/52 127/68  Pulse: 66 69 71 68  Resp: 19 (!) Temp: 97.6 F (36.4 C) 98 F (36.7 C) 98.2 F (36.8 C) 97.6 F (36.4 C)  TempSrc: Oral Oral Oral Oral  SpO2: 93% 97% 97% 97%  Height:        GEN- The patient is well appearing, alert and oriented x 3 today.   Head- normocephalic, atraumatic Eyes-  Sclera clear, conjunctiva pink Ears- hearing intact Oropharynx- clear Neck- supple Lungs- CTA b/l, normal work of  breathing Heart- RRR, no murmurs, rubs or gallops  GI- soft,  NT, ND Extremities- no clubbing, cyanosis, or edema MS- no significant deformity or atrophy Skin- no rash or lesion Psych- euthymic mood, full affect   Labs:   Lab Results  Component Value Date   WBC 7.3 07/24/2018   HGB 12.6 (L) 07/24/2018   HCT 37.1 (L) 07/24/2018   MCV 94.2 07/24/2018   PLT 201 07/24/2018    Recent Labs  Lab 07/24/18 0458 07/25/18 0454  NA 140 140  K 3.3* 4.0  CL 104 106  CO2 26 26  BUN 12 14  CREATININE 0.83 0.84  CALCIUM 8.5* 9.0  PROT 6.6  --   BILITOT 0.8  --   ALKPHOS 47  --   ALT 17  --   AST 17  --   GLUCOSE 101* 106*   No results found for: CKTOTAL, CKMB, CKMBINDEX, TROPONINI Lab Results  Component Value Date   CHOL 123 07/24/2018   Lab Results  Component Value Date   HDL 33 (L) 07/24/2018   Lab Results  Component Value Date   LDLCALC 65 07/24/2018   Lab Results  Component Value Date   TRIG 123 07/24/2018   Lab Results  Component Value Date   CHOLHDL 3.7 07/24/2018   No results found for: LDLDIRECT  No results found for: DDIMER   Radiology/Studies:   Ct Angio Head W Or Wo Contrast Result Date: 07/23/2018 CLINICAL DATA:  78 y/o M; right facial droop and slurred speech, stroke follow-up. EXAM: CT ANGIOGRAPHY HEAD AND NECK TECHNIQUE: Multidetector CT imaging of the head and neck was performed using the standard protocol during bolus administration of intravenous contrast. Multiplanar CT image reconstructions and MIPs were obtained to evaluate the vascular anatomy. Carotid stenosis measurements (when applicable) are obtained utilizing NASCET criteria, using the distal internal carotid diameter as the denominator. CONTRAST:  75 cc Omnipaque 350 COMPARISON:  07/23/2018 CT head. FINDINGS: CTA NECK FINDINGS Aortic arch: Standard branching. Imaged portion shows no evidence of aneurysm or dissection. No significant stenosis of the major arch vessel origins. CABG with LIMA and  saphenous grafts. Mixed plaque of aortic arch. Right carotid system: No evidence of dissection, stenosis (50% or greater) or occlusion. Non stenotic calcific atherosclerosis of carotid bifurcation. Left carotid system: No evidence of dissection, stenosis (50% or greater) or occlusion. Non stenotic calcific atherosclerosis of carotid bifurcation. Vertebral arteries: Codominant. No evidence of dissection, stenosis (50% or greater) or occlusion. Skeleton: Cervical spondylosis with grade 1 anterolisthesis at C2-3 and C3-4, reversal of cervical lordosis, and advanced discogenic degenerative changes at C4-C7. Left-greater-than-right facet arthropathy. Multifactorial at least moderate spinal canal stenosis at C4-C7. Multilevel neural foraminal stenosis. Other neck: Negative. Upper chest: Partially visualized fatty mass within the right paramedian anterior chest wall spanning up to 12 cm, probably lipoma. Review of the MIP images confirms the above findings CTA HEAD FINDINGS Anterior circulation: No significant stenosis, proximal occlusion, aneurysm, or vascular malformation. Non stenotic calcific atherosclerosis of internal carotid arteries. Posterior circulation: No significant stenosis, proximal occlusion, aneurysm, or vascular malformation. Venous sinuses: As permitted by contrast timing, patent. Anatomic variants: None significant. Review of the MIP images confirms the above findings IMPRESSION: 1. Patent carotid and vertebral arteries. No dissection, aneurysm, or hemodynamically significant stenosis utilizing NASCET criteria. 2. Patent anterior and posterior intracranial circulation. No large vessel occlusion, aneurysm, or significant stenosis. 3. Non stenotic calcific atherosclerosis of the aorta and carotid systems. 4. Advanced spondylosis of the cervical spine with at least moderate spinal canal stenosis from C4-C7. Electronically Signed   By:  Mitzi Hansen M.D.   On: 07/23/2018 19:11    Dg Chest 2  View Result Date: 07/23/2018 CLINICAL DATA:  Slurred speech. EXAM: CHEST - 2 VIEW COMPARISON:  None. FINDINGS: The heart size and mediastinal contours are within normal limits. Both lungs are clear. The visualized skeletal structures are unremarkable. IMPRESSION: No active cardiopulmonary disease. Electronically Signed   By: Gerome Sam III M.D   On: 07/23/2018 20:50     Mr Brain Wo Contrast Result Date: 07/23/2018 CLINICAL DATA:  78 y/o M; right-sided weakness, facial droop, and slurred speech. EXAM: MRI HEAD WITHOUT CONTRAST TECHNIQUE: Multiplanar, multiecho pulse sequences of the brain and surrounding structures were obtained without intravenous contrast. COMPARISON:  07/23/2018 CT head and CTA head. FINDINGS: Brain: Multiple scattered punctate foci of reduced diffusion are present in the left posterior frontal lobe and the parietal lobe compatible with acute/early subacute infarction. No associated acute hemorrhage or mass effect. Punctate and early confluent nonspecific T2 FLAIR hyperintensities in subcortical and periventricular white matter are compatible with mild to moderate chronic microvascular ischemic changes. Mild volume loss of the brain. Small chronic infarcts are present in the left thalamus, caudate head, and putamen extending into periventricular white matter. Small focus of cortical encephalomalacia of the left anterior inferior frontal lobe. No extra-axial collection, hydrocephalus, or herniation. Vascular: Normal flow voids. Skull and upper cervical spine: Normal marrow signal. Sinuses/Orbits: Mild anterior ethmoid and left maxillary sinus mucosal thickening. Right intra-ocular lens replacement. Trace opacification of right mastoid air cells. Other: None. IMPRESSION: 1. Multiple punctate acute/early subacute infarctions within the left posterior MCA distribution. No associated hemorrhage or mass effect. 2. Mild-to-moderate chronic microvascular ischemic changes and mild volume loss of  the brain. Small chronic infarctions in the left basal ganglia. Small area of encephalomalacia of left anterior inferior frontal lobe, possibly chronic infarct or sequelae of traumatic brain injury. These results Dazaria Macneill be called to the ordering clinician or representative by the Radiologist Assistant, and communication documented in the PACS or zVision Dashboard. Electronically Signed   By: Mitzi Hansen M.D.   On: 07/23/2018 21:30     Ct Head Code Stroke Wo Contrast Result Date: 07/23/2018 CLINICAL DATA:  Code stroke. Encephalopathy. Right-sided facial droop and slurred speech. Last seen normal 5 p.m. EXAM: CT HEAD WITHOUT CONTRAST TECHNIQUE: Contiguous axial images were obtained from the base of the skull through the vertex without intravenous contrast. COMPARISON:  None. FINDINGS: Brain: There is no mass, hemorrhage or extra-axial collection. The size and configuration of the ventricles and extra-axial CSF spaces are normal. Areas of hypoattenuation of the deep gray nuclei and confluent periventricular white matter hypodensity, consistent with chronic small vessel disease. Old infarcts of the left basal ganglia and thalamus. Vascular: No abnormal hyperdensity of the major intracranial arteries or dural venous sinuses. No intracranial atherosclerosis. Skull: The visualized skull base, calvarium and extracranial soft tissues are normal. Sinuses/Orbits: No fluid levels or advanced mucosal thickening of the visualized paranasal sinuses. No mastoid or middle ear effusion. The orbits are normal. ASPECTS [facility] Stroke Program Early CT Score) - Ganglionic level infarction (caudate, lentiform nuclei, internal capsule, insula, M1-M3 cortex): 7 - Supraganglionic infarction (M4-M6 cortex): 3 Total score (0-10 with 10 being normal): 10 IMPRESSION: 1. No intracranial hemorrhage. 2. ASPECTS is 10. These results were communicated to Dr. Ritta Slot at 6:47 pm on 07/23/2018 by text page via the [facility]  messaging system. Electronically Signed   By: Deatra Robinson M.D.   On: 07/23/2018 18:48  Vas [place] Lower Extremity Venous (dvt) Result Date: 07/24/2018  Lower Venous Study Indications: Stroke.  Comparison Study: No prior Performing Technologist: Blanch MediaMegan Riddle RVS  Examination Guidelines: A complete evaluation includes B-mode imaging, spectral Doppler, color Doppler, and power Doppler as needed of all accessible portions of each vessel. Bilateral testing is considered an integral part of a complete examination. Limited examinations for reoccurring indications may be performed as noted. Summary: Right: There is no evidence of deep vein thrombosis in the lower extremity. No cystic structure found in the popliteal fossa. Left: There is no evidence of deep vein thrombosis in the lower extremity. A cystic structure is found in the popliteal fossa.  *See table(s) above for measurements and observations. Electronically signed by Sherald Hesshristopher Clark MD on 07/24/2018 at 5:12:09 PM.    Final     12-lead ECG SR All prior EKG's in EPIC reviewed with no documented atrial fibrillation  Telemetry SR  Assessment and Plan:  1. Cryptogenic stroke The patient presents with cryptogenic stroke.  Dr. Elberta Fortiscamnitz spoke at length with the patient about monitoring for afib with either a 30 day event monitor or an implantable loop recorder.  Risks, benefits, and alteratives to implantable loop recorder were discussed with the patient today.   At this time, the patient is very clear in their decision to proceed with implantable loop recorder.   Wound care was reviewed with the patient (keep incision clean and dry for 3 days).  Wound check Roth Ress be scheduled for the pateint  Please call with questions.   Sheilah PigeonRenee Lynn Ursuy, PA-C 07/25/2018  I have seen and examined this patient with Francis Dowseenee Ursuy.  Agree with above, note added to reflect my findings.  On exam, RRR, no murmurs, lungs clear.  Patient presented to the hospital with  left sided weakness and slurred speech. Was diagnosed with cryptogenic stroke. Workup thus far without major abnormality. Plan for TEE. If negative, plan for LINQ implant. Risks and benefits discussed. Risks include but not limited to bleeding and infection. The patient understands the risks and has agreed to the procedure.  Kemiyah Tarazon M. Hani Campusano MD 07/25/2018 12:35 PM

## 2018-07-26 ENCOUNTER — Telehealth: Payer: Self-pay

## 2018-07-26 NOTE — Telephone Encounter (Signed)
Transition Care Management Follow-up Telephone Call  Date of discharge and from where: 7/14/202 [place]   How have you been since you were released from the hospital? "doing good today"  Any questions or concerns? No   Items Reviewed:  Did the pt receive and understand the discharge instructions provided? Yes   Medications obtained and verified? Yes   Any new allergies since your discharge? No   Dietary orders reviewed? Yes  Do you have support at home? Yes   Functional Questionnaire: (I = Independent and D = Dependent) ADLs:   Bathing/Dressing- d, wife assists   Meal Prep- d  Eating- i  Maintaining continence- i  Transferring/Ambulation- i  Managing Meds- d, wife assists   Follow up appointments reviewed:   PCP Hospital f/u appt confirmed? Yes  Scheduled to see Dr.Karamalegos  on 07/31/2018 @ [facility] f/u appt confirmed? n/a  Are transportation arrangements needed? No  virtual visit   If their condition worsens, is the pt aware to call PCP or go to the Emergency Dept.? Yes  Was the patient provided with contact information for the PCP's office or ED? Yes  Was to pt encouraged to call back with questions or concerns? Yes

## 2018-07-27 DIAGNOSIS — G4733 Obstructive sleep apnea (adult) (pediatric): Secondary | ICD-10-CM | POA: Diagnosis not present

## 2018-07-27 DIAGNOSIS — M4802 Spinal stenosis, cervical region: Secondary | ICD-10-CM | POA: Diagnosis not present

## 2018-07-27 DIAGNOSIS — I69392 Facial weakness following cerebral infarction: Secondary | ICD-10-CM | POA: Diagnosis not present

## 2018-07-27 DIAGNOSIS — I69331 Monoplegia of upper limb following cerebral infarction affecting right dominant side: Secondary | ICD-10-CM | POA: Diagnosis not present

## 2018-07-27 DIAGNOSIS — M47812 Spondylosis without myelopathy or radiculopathy, cervical region: Secondary | ICD-10-CM | POA: Diagnosis not present

## 2018-07-27 DIAGNOSIS — Z955 Presence of coronary angioplasty implant and graft: Secondary | ICD-10-CM | POA: Diagnosis not present

## 2018-07-27 DIAGNOSIS — F329 Major depressive disorder, single episode, unspecified: Secondary | ICD-10-CM | POA: Diagnosis not present

## 2018-07-27 DIAGNOSIS — F419 Anxiety disorder, unspecified: Secondary | ICD-10-CM | POA: Diagnosis not present

## 2018-07-27 DIAGNOSIS — Z9981 Dependence on supplemental oxygen: Secondary | ICD-10-CM | POA: Diagnosis not present

## 2018-07-27 DIAGNOSIS — Z7982 Long term (current) use of aspirin: Secondary | ICD-10-CM | POA: Diagnosis not present

## 2018-07-27 DIAGNOSIS — Z7902 Long term (current) use of antithrombotics/antiplatelets: Secondary | ICD-10-CM | POA: Diagnosis not present

## 2018-07-27 DIAGNOSIS — I69354 Hemiplegia and hemiparesis following cerebral infarction affecting left non-dominant side: Secondary | ICD-10-CM | POA: Diagnosis not present

## 2018-07-27 DIAGNOSIS — I25118 Atherosclerotic heart disease of native coronary artery with other forms of angina pectoris: Secondary | ICD-10-CM | POA: Diagnosis not present

## 2018-07-27 DIAGNOSIS — E782 Mixed hyperlipidemia: Secondary | ICD-10-CM | POA: Diagnosis not present

## 2018-07-27 DIAGNOSIS — J449 Chronic obstructive pulmonary disease, unspecified: Secondary | ICD-10-CM | POA: Diagnosis not present

## 2018-07-27 DIAGNOSIS — E039 Hypothyroidism, unspecified: Secondary | ICD-10-CM | POA: Diagnosis not present

## 2018-07-27 DIAGNOSIS — I480 Paroxysmal atrial fibrillation: Secondary | ICD-10-CM | POA: Diagnosis not present

## 2018-07-27 DIAGNOSIS — I69328 Other speech and language deficits following cerebral infarction: Secondary | ICD-10-CM | POA: Diagnosis not present

## 2018-07-27 DIAGNOSIS — I5032 Chronic diastolic (congestive) heart failure: Secondary | ICD-10-CM | POA: Diagnosis not present

## 2018-07-27 DIAGNOSIS — I110 Hypertensive heart disease with heart failure: Secondary | ICD-10-CM | POA: Diagnosis not present

## 2018-07-27 DIAGNOSIS — E876 Hypokalemia: Secondary | ICD-10-CM | POA: Diagnosis not present

## 2018-07-27 DIAGNOSIS — I272 Pulmonary hypertension, unspecified: Secondary | ICD-10-CM | POA: Diagnosis not present

## 2018-07-27 DIAGNOSIS — Z48812 Encounter for surgical aftercare following surgery on the circulatory system: Secondary | ICD-10-CM | POA: Diagnosis not present

## 2018-07-27 DIAGNOSIS — Z95818 Presence of other cardiac implants and grafts: Secondary | ICD-10-CM | POA: Diagnosis not present

## 2018-07-27 DIAGNOSIS — R739 Hyperglycemia, unspecified: Secondary | ICD-10-CM | POA: Diagnosis not present

## 2018-07-28 DIAGNOSIS — I69354 Hemiplegia and hemiparesis following cerebral infarction affecting left non-dominant side: Secondary | ICD-10-CM | POA: Diagnosis not present

## 2018-07-28 DIAGNOSIS — I69328 Other speech and language deficits following cerebral infarction: Secondary | ICD-10-CM | POA: Diagnosis not present

## 2018-07-28 DIAGNOSIS — I69331 Monoplegia of upper limb following cerebral infarction affecting right dominant side: Secondary | ICD-10-CM | POA: Diagnosis not present

## 2018-07-28 DIAGNOSIS — E876 Hypokalemia: Secondary | ICD-10-CM | POA: Diagnosis not present

## 2018-07-28 DIAGNOSIS — Z48812 Encounter for surgical aftercare following surgery on the circulatory system: Secondary | ICD-10-CM | POA: Diagnosis not present

## 2018-07-28 DIAGNOSIS — I69392 Facial weakness following cerebral infarction: Secondary | ICD-10-CM | POA: Diagnosis not present

## 2018-07-31 ENCOUNTER — Encounter: Payer: Self-pay | Admitting: Family Medicine

## 2018-07-31 ENCOUNTER — Ambulatory Visit (INDEPENDENT_AMBULATORY_CARE_PROVIDER_SITE_OTHER): Payer: Medicare Other | Admitting: Family Medicine

## 2018-07-31 ENCOUNTER — Other Ambulatory Visit: Payer: Self-pay

## 2018-07-31 DIAGNOSIS — I679 Cerebrovascular disease, unspecified: Secondary | ICD-10-CM

## 2018-07-31 DIAGNOSIS — G8194 Hemiplegia, unspecified affecting left nondominant side: Secondary | ICD-10-CM

## 2018-07-31 DIAGNOSIS — G8191 Hemiplegia, unspecified affecting right dominant side: Secondary | ICD-10-CM | POA: Insufficient documentation

## 2018-07-31 DIAGNOSIS — I6930 Unspecified sequelae of cerebral infarction: Secondary | ICD-10-CM

## 2018-07-31 DIAGNOSIS — E876 Hypokalemia: Secondary | ICD-10-CM

## 2018-07-31 NOTE — Progress Notes (Signed)
Subjective:    Patient ID: Luke Mckee, male    DOB: 12/11/1940, 78 y.o.   MRN: [id number]  Luke CallerRandall Mckee is a 78 y.o. male presenting on 07/31/2018 for Hospitalization Follow-up (CVA - Stroke)  Multimedia programmerVirtual / Telehealth Encounter - Telephone  The purpose of this virtual visit is to provide medical care while limiting exposure to the novel coronavirus (COVID19) for both patient and office staff.  Consent was obtained for remote visit:  Yes.   Answered questions that patient had about telehealth interaction:  Yes.   I discussed the limitations, risks, security and privacy concerns of performing an evaluation and management service by video/telephone. I also discussed with the patient that there may be a patient responsible charge related to this service. The patient expressed understanding and agreed to proceed.  Patient Location: Home [facility]   HPI  HOSPITAL FOLLOW-UP VISIT  [facility] Date of Admission: 07/23/18 Date of Discharge: 07/25/18 Transitions of care telephone call: completed on 07/26/18  Reason for Admission: Stroke Primary (+Secondary) Diagnosis: Multiple L MCA infarcts thought to be embolic, question if possible AFib, s/p loop recorder  - Hospital H&P and Discharge Summary have been reviewed - Patient presents today 6 days after recent hospitalization. Brief summary of recent course, patient had symptoms of R sided weakness, slurred speech, facial droop consistent with stroke, hospitalized, treated by stroke team and cardiology. Identified on MRI. Had ECHO dopplers, labs. Changed from aggrenox + plavix over to Plavix + Aspirin 81mg , they recommended 3 weeks then switch to Plavix monotherapy - Loop recorder was placed. HH PT OT discharge  - Today reports overall has done well after discharge. Symptoms of weakness have improved now on R side, he feels improved and like he wants to mow the lawn already but says it is too  hot outside. - Right sided weakness notably improving. PT / OT evaluation, still some difficulty with writing with R arm. They will continue with PT for few more weeks. OT less needed. Keep current exercise. - Known heart disease CAD CABG, in past had heart monitor out of state in [place] but never confirmed if AFib.  - Changes to current meds on discharge: DC'd Aggrenox.   [facility] Malvin JohnsPotter 08/28/18, from previous referral. They will keep this apt. -Now they need to call to schedule Dr [facility] to schedule, has apt for loop recorder - Hospitalist request repeat lab check BMET for K   I have reviewed the discharge medication list, and have reconciled the current and discharge medications today.   Current Outpatient Medications:  .  acetaminophen (TYLENOL 8 HOUR) 650 MG CR tablet, Take 1,300 mg by mouth once., Disp: , Rfl:  .  aspirin EC 81 MG EC tablet, Take 1 tablet (81 mg total) by mouth daily for 21 days., Disp: , Rfl:  .  atenolol (TENORMIN) 25 MG tablet, Take 25 mg by mouth daily. Taking with 50mg  tablet = 75mg  daily, Disp: , Rfl:  .  atenolol (TENORMIN) 50 MG tablet, Take 1 tablet (50 mg total) by mouth every evening., Disp: 90 tablet, Rfl: 3 .  atorvastatin (LIPITOR) 80 MG tablet, TAKE 1 TABLET BY MOUTH DAILY (Patient taking differently: Take 80 mg by mouth. ), Disp: 90 tablet, Rfl: 1 .  cetirizine (ZYRTEC) 10 MG tablet, Take 10 mg by mouth at bedtime as needed for allergies. , Disp: , Rfl:  .  Cholecalciferol (VITAMIN D) 50 MCG (2000  UT) tablet, Take 4,000 Units by mouth daily., Disp: , Rfl:  .  clopidogrel (PLAVIX) 75 MG tablet, Take 1 tablet (75 mg total) by mouth daily., Disp: 90 tablet, Rfl: 3 .  diclofenac sodium (VOLTAREN) 1 % GEL, Apply 2 g topically 4 (four) times daily as needed for pain. Foot pain, Disp: , Rfl:  .  docusate sodium (COLACE) 100 MG capsule, Take 100 mg by mouth daily as needed for constipation., Disp: , Rfl:  .   furosemide (LASIX) 80 MG tablet, Take 1 tablet (80 mg total) by mouth 2 (two) times daily., Disp: 180 tablet, Rfl: 1 .  isosorbide mononitrate (IMDUR) 30 MG 24 hr tablet, TAKE 1 TABLET BY MOUTH DAILY (Patient taking differently: Take 30 mg by mouth daily. ), Disp: 90 tablet, Rfl: 1 .  levothyroxine (SYNTHROID) 112 MCG tablet, TAKE 1 TABLET BY MOUTH DAILY( BEFORE BREAKFAST) (Patient taking differently: Take 112 mcg by mouth daily before breakfast. ), Disp: 90 tablet, Rfl: 1 .  LORazepam (ATIVAN) 0.5 MG tablet, Take 1 tablet (0.5 mg total) by mouth daily as needed for anxiety or sleep., Disp: 30 tablet, Rfl: 0 .  Multiple Vitamin (MULTIVITAMIN) tablet, Take 1 tablet by mouth daily., Disp: , Rfl:  .  sertraline (ZOLOFT) 100 MG tablet, Take 1.5 tablets (150 mg total) by mouth daily., Disp: 45 tablet, Rfl: 1 .  spironolactone (ALDACTONE) 25 MG tablet, TAKE 1/2 TABLET BY MOUTH DAILY (Patient taking differently: Take 12.5 mg by mouth daily. ), Disp: 45 tablet, Rfl: 1  ------------------------------------------------------------------------- Social History   Tobacco Use  . Smoking status: Never Smoker  . Smokeless tobacco: Never Used  Substance Use Topics  . Alcohol use: Not Currently    Comment: past  . Drug use: Never    Review of Systems Per HPI unless specifically indicated above     Objective:    There were no vitals taken for this visit.  Wt Readings from Last 3 Encounters:  07/20/18 246 lb (111.6 kg)  06/07/18 247 lb (112 kg)    Physical Exam   Virtual visit by phone. No physical exam performed.  Results for orders placed or performed during the hospital encounter of 07/23/18  SARS Coronavirus 2 (CEPHEID - Performed in [facility]), [facility] Order   Specimen: Nasopharyngeal Swab  Result Value Ref Range   SARS Coronavirus 2 NEGATIVE NEGATIVE  Protime-INR  Result Value Ref Range   Prothrombin Time 14.6 11.4 - 15.2 seconds   INR 1.2 0.8 - 1.2  APTT  Result Value Ref  Range   aPTT 28 24 - 36 seconds  CBC  Result Value Ref Range   WBC 7.0 4.0 - 10.5 K/uL   RBC 4.18 (L) 4.22 - 5.81 MIL/uL   Hemoglobin 13.4 13.0 - 17.0 g/dL   HCT 82.940.3 56.239.0 - 13.052.0 %   MCV 96.4 80.0 - 100.0 fL   MCH 32.1 26.0 - 34.0 pg   MCHC 33.3 30.0 - 36.0 g/dL   RDW 86.514.3 78.411.5 - 69.615.5 %   Platelets 210 150 - 400 K/uL   nRBC 0.0 0.0 - 0.2 %  Differential  Result Value Ref Range   Neutrophils Relative % 55 %   Neutro Abs 3.8 1.7 - 7.7 K/uL   Lymphocytes Relative 30 %   Lymphs Abs 2.1 0.7 - 4.0 K/uL   Monocytes Relative 10 %   Monocytes Absolute 0.7 0.1 - 1.0 K/uL   Eosinophils Relative 4 %   Eosinophils Absolute 0.3 0.0 -  0.5 K/uL   Basophils Relative 1 %   Basophils Absolute 0.1 0.0 - 0.1 K/uL   Immature Granulocytes 0 %   Abs Immature Granulocytes 0.02 0.00 - 0.07 K/uL  Comprehensive metabolic panel  Result Value Ref Range   Sodium 140 135 - 145 mmol/L   Potassium 3.2 (L) 3.5 - 5.1 mmol/L   Chloride 103 98 - 111 mmol/L   CO2 26 22 - 32 mmol/L   Glucose, Bld 123 (H) 70 - 99 mg/dL   BUN 16 8 - 23 mg/dL   Creatinine, Ser 0.98 0.61 - 1.24 mg/dL   Calcium 8.9 8.9 - 10.3 mg/dL   Total Protein 7.3 6.5 - 8.1 g/dL   Albumin 3.8 3.5 - 5.0 g/dL   AST 21 15 - 41 U/L   ALT 20 0 - 44 U/L   Alkaline Phosphatase 54 38 - 126 U/L   Total Bilirubin 0.7 0.3 - 1.2 mg/dL   GFR calc non Af Amer >60 >60 mL/min   GFR calc Af Amer >60 >60 mL/min   Anion gap 11 5 - 15  Hemoglobin A1c  Result Value Ref Range   Hgb A1c MFr Bld 6.3 (H) 4.8 - 5.6 %   Mean Plasma Glucose 134.11 mg/dL  Comprehensive metabolic panel  Result Value Ref Range   Sodium 140 135 - 145 mmol/L   Potassium 3.3 (L) 3.5 - 5.1 mmol/L   Chloride 104 98 - 111 mmol/L   CO2 26 22 - 32 mmol/L   Glucose, Bld 101 (H) 70 - 99 mg/dL   BUN 12 8 - 23 mg/dL   Creatinine, Ser 0.83 0.61 - 1.24 mg/dL   Calcium 8.5 (L) 8.9 - 10.3 mg/dL   Total Protein 6.6 6.5 - 8.1 g/dL   Albumin 3.4 (L) 3.5 - 5.0 g/dL   AST 17 15 - 41 U/L   ALT  17 0 - 44 U/L   Alkaline Phosphatase 47 38 - 126 U/L   Total Bilirubin 0.8 0.3 - 1.2 mg/dL   GFR calc non Af Amer >60 >60 mL/min   GFR calc Af Amer >60 >60 mL/min   Anion gap 10 5 - 15  CBC  Result Value Ref Range   WBC 7.3 4.0 - 10.5 K/uL   RBC 3.94 (L) 4.22 - 5.81 MIL/uL   Hemoglobin 12.6 (L) 13.0 - 17.0 g/dL   HCT 37.1 (L) 39.0 - 52.0 %   MCV 94.2 80.0 - 100.0 fL   MCH 32.0 26.0 - 34.0 pg   MCHC 34.0 30.0 - 36.0 g/dL   RDW 14.1 11.5 - 15.5 %   Platelets 201 150 - 400 K/uL   nRBC 0.0 0.0 - 0.2 %  Lipid panel  Result Value Ref Range   Cholesterol 123 0 - 200 mg/dL   Triglycerides 123 <150 mg/dL   HDL 33 (L) >40 mg/dL   Total CHOL/HDL Ratio 3.7 RATIO   VLDL 25 0 - 40 mg/dL   LDL Cholesterol 65 0 - 99 mg/dL  TSH  Result Value Ref Range   TSH 2.086 0.350 - 4.500 uIU/mL  Magnesium  Result Value Ref Range   Magnesium 2.2 1.7 - 2.4 mg/dL  Basic metabolic panel  Result Value Ref Range   Sodium 140 135 - 145 mmol/L   Potassium 4.0 3.5 - 5.1 mmol/L   Chloride 106 98 - 111 mmol/L   CO2 26 22 - 32 mmol/L   Glucose, Bld 106 (H) 70 - 99 mg/dL   BUN  14 8 - 23 mg/dL   Creatinine, Ser 4.090.84 0.61 - 1.24 mg/dL   Calcium 9.0 8.9 - 81.110.3 mg/dL   GFR calc non Af Amer >60 >60 mL/min   GFR calc Af Amer >60 >60 mL/min   Anion gap 8 5 - 15  I-stat chem 8, ED  Result Value Ref Range   Sodium 141 135 - 145 mmol/L   Potassium 3.2 (L) 3.5 - 5.1 mmol/L   Chloride 102 98 - 111 mmol/L   BUN 17 8 - 23 mg/dL   Creatinine, Ser 9.140.90 0.61 - 1.24 mg/dL   Glucose, Bld 782122 (H) 70 - 99 mg/dL   Calcium, Ion 9.561.09 (L) 1.15 - 1.40 mmol/L   TCO2 29 22 - 32 mmol/L   Hemoglobin 13.9 13.0 - 17.0 g/dL   HCT 21.341.0 08.639.0 - 57.852.0 %  ECHOCARDIOGRAM COMPLETE  Result Value Ref Range   Height 68 in   BP 117/63 mmHg      Assessment & Plan:   Problem List Items Addressed This Visit    Hemiplegia of left nondominant side due to cerebrovascular disease (HCC) - Primary   Relevant Orders   BASIC METABOLIC PANEL  WITH GFR   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 (HCC)   History of cerebrovascular accident (CVA) with residual deficit   Hypokalemia   Relevant Orders   BASIC METABOLIC PANEL WITH GFR      Clinically improving R sided hemiplegia from recent multiple small punctate L MCA infarct, thought embolic, possible cryptogenic vs AFib. Known prior CVA 04/2018. Known CAD CABG Was on Aggrenox + Plavix previously, now changed from neurology in hospital to ASA + Plavix for 3 weeks then they recommended plavix monotherapy after that.  - [facility] Dr Malvin JohnsPotter on 8/17 - Discuss antiplatelet agent with Neuro and Cards, pending loop recorder results - discuss if plavix monotherapy is appropriate or if he should continue DAPT. - Proceed with [facility] CCM services by phone within next 1-2 weeks - ordered BMET for K check this week, f/u results  Follow-up with me 6 weeks or within 3 months, keep apt on 10/9 if prefer  No orders of the defined types were placed in this encounter.   Follow up plan: Return in about 6 weeks (around 09/11/2018), or if symptoms worsen or fail to improve.   Saralyn PilarAlexander Karamalegos, DO [facility] 07/31/2018, 8:16 AM

## 2018-07-31 NOTE — Patient Instructions (Addendum)
AVS info given to patient  Call to schedule Dr Rockey Situ Cardiology hospital follow-up  Keep Dr Melrose Nakayama [facility] Neuro on 08/28/18  ASA + Plavix for 3 weeks then may consider Plavix monotherapy   DUE for FASTING BLOOD WORK (no food or drink after midnight before the lab appointment, only water or coffee without cream/sugar on the morning of)  SCHEDULE "Lab Only" visit in the morning at the clinic for lab draw in 1 WEEK  - Make sure Lab Only appointment is at about 1 week before your next appointment, so that results will be available  For Lab Results, once available within 2-3 days of blood draw, you can can log in to MyChart online to view your results and a brief explanation. Also, we can discuss results at next follow-up visit.   Please schedule a Follow-up Appointment to: Return in about 6 weeks (around 09/11/2018), or if symptoms worsen or fail to improve.  If you have any other questions or concerns, please feel free to call the office or send a message through [place]. You may also schedule an earlier appointment if necessary.  Additionally, you may be receiving a survey about your experience at our office within a few days to 1 week by e-mail or mail. We value your feedback.  Nobie Putnam, DO [place]

## 2018-08-01 DIAGNOSIS — Z48812 Encounter for surgical aftercare following surgery on the circulatory system: Secondary | ICD-10-CM | POA: Diagnosis not present

## 2018-08-01 DIAGNOSIS — I69354 Hemiplegia and hemiparesis following cerebral infarction affecting left non-dominant side: Secondary | ICD-10-CM | POA: Diagnosis not present

## 2018-08-01 DIAGNOSIS — E876 Hypokalemia: Secondary | ICD-10-CM | POA: Diagnosis not present

## 2018-08-01 DIAGNOSIS — I69331 Monoplegia of upper limb following cerebral infarction affecting right dominant side: Secondary | ICD-10-CM | POA: Diagnosis not present

## 2018-08-01 DIAGNOSIS — I69328 Other speech and language deficits following cerebral infarction: Secondary | ICD-10-CM | POA: Diagnosis not present

## 2018-08-01 DIAGNOSIS — I69392 Facial weakness following cerebral infarction: Secondary | ICD-10-CM | POA: Diagnosis not present

## 2018-08-03 ENCOUNTER — Other Ambulatory Visit: Payer: Medicare Other

## 2018-08-03 DIAGNOSIS — E876 Hypokalemia: Secondary | ICD-10-CM | POA: Diagnosis not present

## 2018-08-03 DIAGNOSIS — G8194 Hemiplegia, unspecified affecting left nondominant side: Secondary | ICD-10-CM | POA: Diagnosis not present

## 2018-08-03 DIAGNOSIS — I679 Cerebrovascular disease, unspecified: Secondary | ICD-10-CM | POA: Diagnosis not present

## 2018-08-04 LAB — BASIC METABOLIC PANEL WITH GFR
BUN: 17 mg/dL (ref 7–25)
CO2: 25 mmol/L (ref 20–32)
Calcium: 9.1 mg/dL (ref 8.6–10.3)
Chloride: 102 mmol/L (ref 98–110)
Creat: 0.81 mg/dL (ref 0.70–1.18)
GFR, Est African American: 99 mL/min/{1.73_m2} (ref >=60)
GFR, Est Non African American: 86 mL/min/{1.73_m2} (ref >=60)
Glucose, Bld: 114 mg/dL — ABNORMAL HIGH (ref 65–99)
Potassium: 3.9 mmol/L (ref 3.5–5.3)
Sodium: 140 mmol/L (ref 135–146)

## 2018-08-07 ENCOUNTER — Telehealth: Payer: Self-pay

## 2018-08-07 NOTE — Telephone Encounter (Signed)
    COVID-19 Pre-Screening Questions:  . In the past 7 to 10 days have you had a cough,  shortness of breath, headache, congestion, fever (100 or greater) body aches, chills, sore throat, or sudden loss of taste or sense of smell? No . Have you been around anyone with known Covid 19. No . Have you been around anyone who is awaiting Covid 19 test results in the past 7 to 10 days? Yes the person voluntarily did the Covid-19 test result. . Have you been around anyone who has been exposed to Covid 19, or has mentioned symptoms of Covid 19 within the past 7 to 10 days? No  If you have any concerns/questions about symptoms patients report during screening (either on the phone or at [facility]). Contact the provider seeing the patient or DOD for further guidance.  If neither are available contact a member of the leadership team.        The pt answered no to 3 out of 4 questions. Pt states when he had a stroke the hospital wanted to be sure he was not exposed to the virus and a family member voluntarily took the covid-19 test. I told the pt he would have to wear a mask for his appointment. Pt states he want his wife to come to the appointment with him. I told him if he can physically walk into the appointment he will have to come alone. I told him for this appointment the nurse will assess the wound to make sure everything is ok. I told him if anything changes between now and his appointment time to call to let us know. The pt verbalized understanding.

## 2018-08-08 ENCOUNTER — Ambulatory Visit (INDEPENDENT_AMBULATORY_CARE_PROVIDER_SITE_OTHER): Payer: Medicare Other | Admitting: *Deleted

## 2018-08-08 ENCOUNTER — Other Ambulatory Visit: Payer: Self-pay

## 2018-08-08 DIAGNOSIS — G459 Transient cerebral ischemic attack, unspecified: Secondary | ICD-10-CM

## 2018-08-08 LAB — CUP PACEART INCLINIC DEVICE CHECK
Date Time Interrogation Session: 20200728161709
Implantable Pulse Generator Implant Date: 20200714

## 2018-08-08 NOTE — Progress Notes (Signed)
ILR wound check in clinic. Steri strips removed. Wound well healed. Home monitor transmitting nightly. 1 tachy episode, unable to read ECG. Questions answered.

## 2018-08-16 ENCOUNTER — Ambulatory Visit: Payer: PRIVATE HEALTH INSURANCE | Admitting: Family Medicine

## 2018-08-21 ENCOUNTER — Ambulatory Visit: Payer: Medicare Other | Admitting: *Deleted

## 2018-08-21 DIAGNOSIS — I10 Essential (primary) hypertension: Secondary | ICD-10-CM

## 2018-08-21 DIAGNOSIS — G8194 Hemiplegia, unspecified affecting left nondominant side: Secondary | ICD-10-CM

## 2018-08-21 DIAGNOSIS — I25118 Atherosclerotic heart disease of native coronary artery with other forms of angina pectoris: Secondary | ICD-10-CM

## 2018-08-21 DIAGNOSIS — I679 Cerebrovascular disease, unspecified: Secondary | ICD-10-CM

## 2018-08-21 DIAGNOSIS — I6930 Unspecified sequelae of cerebral infarction: Secondary | ICD-10-CM

## 2018-08-21 NOTE — Patient Instructions (Signed)
Thank you allowing the Chronic Care Management Team to be a part of your care! It was a pleasure speaking with you today!  CCM (Chronic Care Management) Team   Arbor Cohen RN, BSN Nurse Care Coordinator  [phone]  Harlow Asa PharmD  Clinical Pharmacist  [phone]  Eula Fried LCSW Clinical Social Worker [phone]  Goals Addressed            This Visit's Progress   . RNCM- I would like to complete my HCPOA (pt-stated)       Current Barriers:  . Patient new to the area, limited resources/areas to complete HCPOA related to Covid-19 closures  [place]):  Marland Kitchen Over the next 30 days, the patient will complete mailed Advance Directive packet with the assistance of RNCM  Interventions: . Interviewed patient about Financial controller and provided education about the importance of completing advanced directives . Mailed the patient an EMMI educational handout on Advance Directives as well as an Forensic scientist packet  Patient Self Care Activities:  . Self administers medications as prescribed . Attends all scheduled provider appointments . Performs ADL's independently . Calls provider office for new concerns or questions  Initial goal documentation          The patient verbalized understanding of instructions provided today and declined a print copy of patient instruction materials.   The patient has been provided with contact information for the care management team and has been advised to call with any health related questions or concerns.

## 2018-08-21 NOTE — Chronic Care Management (AMB) (Signed)
Chronic Care Management   Initial Visit Note  08/21/2018 Name: Luke Mckee MRN: [id number] DOB: 1940-05-20  Referred by: Olin Hauser, DO Reason for referral : No chief complaint on file.   Luke Mckee is a 78 y.o. year old male who is a primary care patient of Olin Hauser, DO. The CCM team was consulted for assistance with chronic disease management and care coordination needs.   Review of patient status, including review of consultants reports, relevant laboratory and other test results, and collaboration with appropriate care team members and the patient's provider was performed as part of comprehensive patient evaluation and provision of chronic care management services.    SDOH (Social Determinants of Health) screening performed today. See Care Plan Entry related to challenges with: None    Subjective: Initial call to Luke Mckee to obtain history and assess for CCM needs. Patient and spouse both were available and spoke with me on speaker phone. Luke Mckee states he and his spouse recently moved to the area from [place]. Luke Mckee stated he has completed [facility] PT and felt he had gained most of his strength back. He states he has not fallen in over two weeks. Patient denies using cane or walker at this time.  Patient did say he has occasional feelings of feeling "dizzyheaded and heavy headed", but does not feel like he is going to pass out or lightheadedness.   Spouse states they do have a b/p cuff and have taken his b/p during these spells and b/p is always fine. Patient and spouse agree to a medication review with CCM pharmacist to go over potential medication side effect. Reviewed upcoming appointment with Dr. Melrose Nakayama Neurology and gave spouse the address.    Objective:   Goals Addressed            This Visit's Progress   . RNCM- I would like to complete my HCPOA (pt-stated)       Current Barriers:  . Patient new to the area, limited resources/areas to complete  HCPOA related to Covid-19 closures  [place]):  Marland Kitchen Over the next 30 days, the patient will complete mailed Advance Directive packet with the assistance of RNCM  Interventions: . Interviewed patient about Financial controller and provided education about the importance of completing advanced directives . Mailed the patient an EMMI educational handout on Advance Directives as well as an Forensic scientist packet  Patient Self Care Activities:  . Self administers medications as prescribed . Attends all scheduled provider appointments . Performs ADL's independently . Calls provider office for new concerns or questions  Initial goal documentation           Mr. Ocain was given information about Chronic Care Management services today including:  1. CCM service includes personalized support from designated clinical staff supervised by his physician, including individualized plan of care and coordination with other care providers 2. 24/7 contact phone numbers for assistance for urgent and routine care needs. 3. Service will only be billed when office clinical staff spend 20 minutes or more in a month to coordinate care. 4. Only one practitioner may furnish and bill the service in a calendar month. 5. The patient may stop CCM services at any time (effective at the end of the month) by phone call to the office staff. 6. The patient will be responsible for cost sharing (co-pay) of up to 20% of the service fee (after annual deductible is met).  Patient agreed to services and verbal  consent obtained.   Plan:   The care management team will reach out to the patient again over the next 30 days.  The patient has been provided with contact information for the care management team and has been advised to call with any health related questions or concerns.   Merlene Morse Rogenia Werntz RN, BSN Nurse Case Pharmacist, [facility] Care Management  [phone]) Business  Mobile

## 2018-08-24 ENCOUNTER — Ambulatory Visit: Payer: Medicare Other | Admitting: Licensed Clinical Social Worker

## 2018-08-24 ENCOUNTER — Other Ambulatory Visit: Payer: Self-pay

## 2018-08-24 DIAGNOSIS — F419 Anxiety disorder, unspecified: Secondary | ICD-10-CM

## 2018-08-24 NOTE — Chronic Care Management (AMB) (Signed)
  Care Management   Follow Up Note   08/24/2018 Name: Luke Mckee MRN: [id number] DOB: Jul 04, 1940  Referred by: Olin Hauser, DO Reason for referral : Care Coordination   Luke Mckee is a 78 y.o. year old male who is a primary care patient of Olin Hauser, DO. The care management team was consulted for assistance with care management and care coordination needs.    Review of patient status, including review of consultants reports, relevant laboratory and other test results, and collaboration with appropriate care team members and the patient's provider was performed as part of comprehensive patient evaluation and provision of chronic care management services.    LCSW completed initial outreach and spoke with patient successfully. HIPPA verifications received. Patient denied CCM Social Work Services at this time. Patient was agreeable to write down social worker's contact information in case he changes his mind or if future social work needs arise. LCSW will update CCM team.  The patient will call LCSW* as advised if he desires future social work involvement .   Eula Fried, BSW, MSW, [email or web address] Phone: [phone]

## 2018-08-27 LAB — CUP PACEART REMOTE DEVICE CHECK
Date Time Interrogation Session: 20200816164101
Implantable Pulse Generator Implant Date: 20200714

## 2018-08-28 ENCOUNTER — Ambulatory Visit (INDEPENDENT_AMBULATORY_CARE_PROVIDER_SITE_OTHER): Payer: Medicare Other | Admitting: *Deleted

## 2018-08-28 DIAGNOSIS — M1990 Unspecified osteoarthritis, unspecified site: Secondary | ICD-10-CM | POA: Insufficient documentation

## 2018-08-28 DIAGNOSIS — G459 Transient cerebral ischemic attack, unspecified: Secondary | ICD-10-CM | POA: Diagnosis not present

## 2018-08-28 DIAGNOSIS — Z8673 Personal history of transient ischemic attack (TIA), and cerebral infarction without residual deficits: Secondary | ICD-10-CM | POA: Diagnosis not present

## 2018-08-28 DIAGNOSIS — H9319 Tinnitus, unspecified ear: Secondary | ICD-10-CM | POA: Insufficient documentation

## 2018-08-28 DIAGNOSIS — H9190 Unspecified hearing loss, unspecified ear: Secondary | ICD-10-CM | POA: Insufficient documentation

## 2018-08-30 ENCOUNTER — Telehealth: Payer: Self-pay

## 2018-08-30 ENCOUNTER — Ambulatory Visit: Payer: Self-pay | Admitting: Pharmacist

## 2018-08-30 NOTE — Chronic Care Management (AMB) (Signed)
  Chronic Care Management   Follow Up Note   08/30/2018 Name: Luke Mckee MRN: [id number] DOB: [birth year]  Referred by: Olin Hauser, DO Reason for referral : Chronic Care Management (Initial Patient Outreach Call)   [name] is a 78 y.o. year old male who is a primary care patient of Olin Hauser, DO. The CCM team was consulted for assistance with chronic disease management and care coordination needs.    Receive referral from [place] for medication review  Was unable to reach patient via telephone today and have left HIPAA compliant voicemail asking patient to return my call.   Plan  The care management team will reach out to the patient again over the next 14 days.   Harlow Asa, PharmD, [place] [employer] [phone]

## 2018-09-02 ENCOUNTER — Other Ambulatory Visit (INDEPENDENT_AMBULATORY_CARE_PROVIDER_SITE_OTHER): Payer: Medicare Other | Admitting: Family Medicine

## 2018-09-02 DIAGNOSIS — I10 Essential (primary) hypertension: Secondary | ICD-10-CM | POA: Diagnosis not present

## 2018-09-02 DIAGNOSIS — I25810 Atherosclerosis of coronary artery bypass graft(s) without angina pectoris: Secondary | ICD-10-CM | POA: Diagnosis not present

## 2018-09-02 DIAGNOSIS — F419 Anxiety disorder, unspecified: Secondary | ICD-10-CM

## 2018-09-05 NOTE — Progress Notes (Signed)
Carelink Summary Report / Loop Recorder 

## 2018-09-06 ENCOUNTER — Telehealth: Payer: Self-pay

## 2018-09-06 ENCOUNTER — Ambulatory Visit: Payer: Self-pay | Admitting: Pharmacist

## 2018-09-06 NOTE — Chronic Care Management (AMB) (Signed)
  Chronic Care Management   Follow Up Note   09/06/2018 Name: Luke Mckee MRN: [id number] DOB: 07/03/1940  Referred by: Olin Hauser, DO Reason for referral : Chronic Care Management (Initial Outreach Phone Call)   Luke Mckee is a 78 y.o. year old male who is a primary care patient of Olin Hauser, DO. The CCM team was consulted for assistance with chronic disease management and care coordination needs.    Receive referral from [place] for medication review  Was unable to reach patient via telephone today and have left HIPAA compliant voicemail asking patient to return my call. Outreach attempt #2.   Plan  The care management team will reach out to the patient again over the next 14 days.   Harlow Asa, PharmD, [place] [employer] [phone]

## 2018-09-15 ENCOUNTER — Telehealth: Payer: Self-pay

## 2018-09-19 DIAGNOSIS — H35071 Retinal telangiectasis, right eye: Secondary | ICD-10-CM | POA: Diagnosis not present

## 2018-09-21 ENCOUNTER — Telehealth: Payer: Self-pay

## 2018-09-21 ENCOUNTER — Ambulatory Visit: Payer: Self-pay | Admitting: Pharmacist

## 2018-09-21 DIAGNOSIS — I10 Essential (primary) hypertension: Secondary | ICD-10-CM

## 2018-09-21 NOTE — Patient Instructions (Signed)
Thank you allowing the Chronic Care Management Team to be a part of your care! It was a pleasure speaking with you today!     CCM (Chronic Care Management) Team    Janci Minor RN, BSN Nurse Care Coordinator  [phone]   Harlow Asa PharmD  Clinical Pharmacist  [phone]   Eula Fried LCSW Clinical Social Worker [phone]  Visit Information  Goals Addressed            This Visit's Progress   . PharmD - Medication Review       Current Barriers:  Marland Kitchen Knowledge Deficits related to potential medication side effects; need for medication review  Pharmacist Clinical Goal(s):  Marland Kitchen Over the next 30 days, patient will work with CM Pharmacist to address needs related to medication review  Interventions: . Schedule appointment to complete medication review on 9/16 at 2:30 pm  Patient Self Care Activities:  . Calls pharmacy for medication refills . Calls provider office for new concerns or questions  Initial goal documentation        The patient verbalized understanding of instructions provided today and declined a print copy of patient instruction materials.   Telephone follow up appointment with care management team member scheduled for: 9/16 at 2:30 pm  Harlow Asa, PharmD, [place] [phone]

## 2018-09-21 NOTE — Chronic Care Management (AMB) (Signed)
Chronic Care Management   Follow Up Note   09/21/2018 Name: Luke Mckee MRN: [id number] DOB: [birth year]  Referred by: Olin Hauser, DO Reason for referral : Chronic Care Management (Initial Patient Outreach Call)   Luke Mckee is a 78 y.o. year old male who is a primary care patient of Olin Hauser, DO. The CCM team was consulted for assistance with chronic disease management and care coordination needs.    Receive referral from [place] for medication review  I reached out to [employer] by phone today. Mr. Garron states that he is doing fine. Reports that he is not currently at home. Schedule appointment with patient  Review of patient status, including review of consultants reports, relevant laboratory and other test results, and collaboration with appropriate care team members and the patient's provider was performed as part of comprehensive patient evaluation and provision of chronic care management services.     Outpatient Encounter Medications as of 09/21/2018  Medication Sig  . acetaminophen (TYLENOL 8 HOUR) 650 MG CR tablet Take 1,300 mg by mouth once.  Marland Kitchen atenolol (TENORMIN) 50 MG tablet TAKE 1 AND 1/2 TABLETS BY MOUTH DAILY  . atorvastatin (LIPITOR) 80 MG tablet TAKE 1 TABLET BY MOUTH DAILY (Patient taking differently: Take 80 mg by mouth. )  . cetirizine (ZYRTEC) 10 MG tablet Take 10 mg by mouth at bedtime as needed for allergies.   . Cholecalciferol (VITAMIN D) 50 MCG (2000 UT) tablet Take 4,000 Units by mouth daily.  . clopidogrel (PLAVIX) 75 MG tablet Take 1 tablet (75 mg total) by mouth daily.  . diclofenac sodium (VOLTAREN) 1 % GEL Apply 2 g topically 4 (four) times daily as needed for pain. Foot pain  . docusate sodium (COLACE) 100 MG capsule Take 100 mg by mouth daily as needed for constipation.  . furosemide (LASIX) 80 MG tablet Take 1 tablet (80 mg total) by mouth 2 (two) times daily.  . isosorbide mononitrate (IMDUR) 30 MG 24 hr  tablet TAKE 1 TABLET BY MOUTH DAILY (Patient taking differently: Take 30 mg by mouth daily. )  . levothyroxine (SYNTHROID) 112 MCG tablet TAKE 1 TABLET BY MOUTH DAILY( BEFORE BREAKFAST) (Patient taking differently: Take 112 mcg by mouth daily before breakfast. )  . LORazepam (ATIVAN) 0.5 MG tablet Take 1 tablet (0.5 mg total) by mouth daily as needed for anxiety or sleep.  . Multiple Vitamin (MULTIVITAMIN) tablet Take 1 tablet by mouth daily.  . sertraline (ZOLOFT) 100 MG tablet TAKE 1 AND 1/2 TABLETS BY MOUTH DAILY  . spironolactone (ALDACTONE) 25 MG tablet TAKE 1/2 TABLET BY MOUTH DAILY (Patient taking differently: Take 12.5 mg by mouth daily. )   No facility-administered encounter medications on file as of 09/21/2018.     Goals Addressed            This Visit's Progress   . PharmD - Medication Review       Current Barriers:  Marland Kitchen Knowledge Deficits related to potential medication side effects; need for medication review  Pharmacist Clinical Goal(s):  Marland Kitchen Over the next 30 days, patient will work with CM Pharmacist to address needs related to medication review  Interventions: . Schedule appointment to complete medication review on 9/16 at 2:30 pm  Patient Self Care Activities:  . Calls pharmacy for medication refills . Calls provider office for new concerns or questions  Initial goal documentation        Plan  Telephone follow up appointment with care management team  member scheduled for: 9/16 at 2:30 pm  Duanne MoronElisabeth Warrene Kapfer, PharmD, [facility]ACP Clinical Pharmacist [facility] [phone]

## 2018-09-25 ENCOUNTER — Telehealth: Payer: Self-pay

## 2018-09-27 ENCOUNTER — Ambulatory Visit (INDEPENDENT_AMBULATORY_CARE_PROVIDER_SITE_OTHER): Payer: Medicare Other | Admitting: Pharmacist

## 2018-09-27 DIAGNOSIS — I10 Essential (primary) hypertension: Secondary | ICD-10-CM

## 2018-09-27 DIAGNOSIS — I63412 Cerebral infarction due to embolism of left middle cerebral artery: Secondary | ICD-10-CM

## 2018-09-27 NOTE — Patient Instructions (Signed)
Thank you allowing the Chronic Care Management Team to be a part of your care! It was a pleasure speaking with you today!     CCM (Chronic Care Management) Team    Janci Minor RN, BSN Nurse Care Coordinator  [phone]   Harlow Asa PharmD  Clinical Pharmacist  [phone]   Eula Fried LCSW Clinical Social Worker [phone]  Visit Information  Goals Addressed            This Visit's Progress   . COMPLETED: PharmD - Medication Review       Current Barriers:  Marland Kitchen Knowledge Deficits related to potential medication side effects; need for medication review  Pharmacist Clinical Goal(s):  Marland Kitchen Over the next 30 days, patient will work with CM Pharmacist to address needs related to medication review  Interventions: . Perform chart review o Patient seen by Dr. Melrose Nakayama on 8/17 for consult following stroke.  . Comprehensive medication review performed; medication and allergy lists updated in electronic medical record o Mr. Sciandra reports that his wife manages his medications. Complete medication review with Mrs. Rekowski. o Mr. Diloreto denies any recent dizziness. Mrs. Reagle attributes improvement in dizziness to patient's improvements in strength since most recent stroke and adjustments made by Cardiologist to atenolol and isosorbide - separating the administration these medications and reducing atenolol dose.  o Reports patient takes both loratadine 10 mg QAM and cetirizine 10 mg QHS as directed by patient's allergist in [place]. Per Mrs. Donaho, patient instructed to take this way to prevent patient having a skin rash.  - Counsel regarding increased risk of sedation and drying effects of taking these medications in combination. Mrs. elden brucato understanding and denies patient having drowsiness or other side effects with combination.  o Counsel regarding sedation risk with lorazepam. Mrs. Shropshire reports patient uses medication only occasionally . Counsel on importance of blood pressure  monitoring and follow up with Cardiologist o Reports checks Mr. Lor blood pressure occasionally. Last checked 3-4 days ago; reports reading was 129/72, HR 67 o Encourage checking blood pressure as directed by providers, keeping log and bringing log with patient to his medical appointments  Patient Self Care Activities:  . Attends all scheduled provider appointments . Calls pharmacy for medication refills . Calls provider office for new concerns or questions  Please see past updates related to this goal by clicking on the "Past Updates" button in the selected goal         The patient verbalized understanding of instructions provided today and declined a print copy of patient instruction materials.   The patient has been provided with contact information for the care management team and has been advised to call with any health related questions or concerns.   Harlow Asa, PharmD, [place] [employer] [phone]

## 2018-09-27 NOTE — Chronic Care Management (AMB) (Signed)
Chronic Care Management   Follow Up Note   09/27/2018 Name: Luke Mckee MRN: [id number] DOB: 02-Jan-1941  Referred by: Luke Hauser, DO Reason for referral : Chronic Care Management (Patient Phone Call)   Luke Mckee is a 78 y.o. year old male who is a primary care patient of Luke Hauser, DO. The CCM team was consulted for assistance with chronic disease management and care coordination needs.  Luke Mckee has a past medical history including but not limited to recent stroke (07/2018 and 04/2018), CAD s/p CABG, HTN and anxiety.  Receive referral from Luke Mckee for medication review, particularly as patient has reported occasional episodes of dizziness.  I reached out to [employer] by phone today as scheduled to complete medication review.  Review of patient status, including review of consultants reports, relevant laboratory and other test results, and collaboration with appropriate care team members and the patient's provider was performed as part of comprehensive patient evaluation and provision of chronic care management services.      Outpatient Encounter Medications as of 09/27/2018  Medication Sig Note  . acetaminophen (TYLENOL 8 HOUR) 650 MG CR tablet Take 1,300 mg by mouth at bedtime as needed.    Marland Kitchen aspirin 81 MG chewable tablet Chew 81 mg by mouth daily.   Marland Kitchen atenolol (TENORMIN) 50 MG tablet TAKE 1 AND 1/2 TABLETS BY MOUTH DAILY (Patient taking differently: 50 mg at bedtime. )   . atorvastatin (LIPITOR) 80 MG tablet TAKE 1 TABLET BY MOUTH DAILY (Patient taking differently: Take 80 mg by mouth. )   . cetirizine (ZYRTEC) 10 MG tablet Take 10 mg by mouth at bedtime as needed for allergies.    . Cholecalciferol (VITAMIN D) 50 MCG (2000 UT) tablet Take 4,000 Units by mouth daily.   . clopidogrel (PLAVIX) 75 MG tablet Take 1 tablet (75 mg total) by mouth daily.   Marland Kitchen docusate sodium (COLACE) 100 MG capsule Take 100 mg by mouth daily as needed for  constipation.   . furosemide (LASIX) 80 MG tablet Take 1 tablet (80 mg total) by mouth 2 (two) times daily. 09/27/2018: Takes in morning and afternoon  . isosorbide mononitrate (IMDUR) 30 MG 24 hr tablet TAKE 1 TABLET BY MOUTH DAILY (Patient taking differently: Take 30 mg by mouth daily. ) 09/27/2018: Taking in morning  . levothyroxine (SYNTHROID) 112 MCG tablet TAKE 1 TABLET BY MOUTH DAILY( BEFORE BREAKFAST) (Patient taking differently: Take 112 mcg by mouth daily before breakfast. )   . loratadine (CLARITIN) 10 MG tablet Take 10 mg by mouth daily.   . Multiple Vitamin (MULTIVITAMIN) tablet Take 1 tablet by mouth daily.   . sertraline (ZOLOFT) 100 MG tablet TAKE 1 AND 1/2 TABLETS BY MOUTH DAILY   . spironolactone (ALDACTONE) 25 MG tablet TAKE 1/2 TABLET BY MOUTH DAILY (Patient taking differently: Take 12.5 mg by mouth daily. )   . diclofenac sodium (VOLTAREN) 1 % GEL Apply 2 g topically 4 (four) times daily as needed for pain. Foot pain   . LORazepam (ATIVAN) 0.5 MG tablet Take 1 tablet (0.5 mg total) by mouth daily as needed for anxiety or sleep. (Patient not taking: Reported on 09/27/2018)    No facility-administered encounter medications on file as of 09/27/2018.     Goals Addressed            This Visit's Progress   . COMPLETED: PharmD - Medication Review       Current Barriers:  Marland Kitchen Knowledge Deficits related to  potential medication side effects; need for medication review  Pharmacist Clinical Goal(s):  Marland Kitchen. Over the next 30 days, patient will work with CM Pharmacist to address needs related to medication review  Interventions: . Perform chart review o Patient seen by Luke Mckee on 8/17 for consult following stroke.  - Provider instructed patient to continue clopidogrel 75 mg daily and aspirin 81 daily for secondary stroke prevention . Comprehensive medication review performed; medication and allergy lists updated in electronic medical record o Luke Mckee reports that his wife manages his  medications. Complete medication review with Luke Mckee. o Luke Mckee denies any recent dizziness. Luke Mckee attributes improvement in dizziness to patient's improvements in strength since most recent stroke and adjustments made by Cardiologist to atenolol and isosorbide - separating the administration these medications and reducing atenolol dose.  - Per note from Luke Mckee from 6/5, due to blood pressure running low and patient report of dizziness, Luke Mckee instructed patient to: . "Decrease the atenolol down to 50 mg daily evening . Stay on imdur 30 in the AM . Consider splitting the lasix to 8 AM and 2 pm" - Luke Mckee reports patient has continued to follow these dosing directions o Reports patient takes both loratadine 10 mg QAM and cetirizine 10 mg QHS as directed by patient's allergist in [place]. Per Luke Mckee, patient instructed to take this way to prevent patient having a skin rash.  - Counsel regarding increased risk of sedation and drying effects of taking these medications in combination. Luke Mckee verbalizes understanding and denies patient having drowsiness or other side effects with combination.  o Counsel regarding sedation risk with lorazepam. Luke Mckee reports patient uses medication only occasionally . Counsel on importance of blood pressure monitoring and follow up with Cardiologist o Reports checks Luke Mckee's blood pressure occasionally. Last checked 3-4 days ago; reports reading was 129/72, HR 67 o Encourage checking blood pressure as directed by providers, keeping log and bringing log with patient to his medical appointments  Patient Self Care Activities:  . Attends all scheduled provider appointments . Calls pharmacy for medication refills . Calls provider office for new concerns or questions  Please see past updates related to this goal by clicking on the "Past Updates" button in the selected goal         Plan  1) Mr and Luke Mckee deny any further medication  questions/concerns at this time 2) The patient has been provided with contact information for the CM Pharmacist and has been advised to call with any medication related questions or concerns.   Duanne MoronElisabeth Jaymien Landin, PharmD, [facility] Parkway[facility]ACP Clinical Pharmacist [facility] [phone]

## 2018-09-29 ENCOUNTER — Ambulatory Visit (INDEPENDENT_AMBULATORY_CARE_PROVIDER_SITE_OTHER): Payer: Medicare Other | Admitting: *Deleted

## 2018-09-29 DIAGNOSIS — G459 Transient cerebral ischemic attack, unspecified: Secondary | ICD-10-CM | POA: Diagnosis not present

## 2018-09-30 LAB — CUP PACEART REMOTE DEVICE CHECK
Date Time Interrogation Session: 20200918174136
Implantable Pulse Generator Implant Date: 20200714

## 2018-10-02 NOTE — Progress Notes (Signed)
Carelink Summary Report / Loop Recorder 

## 2018-10-05 ENCOUNTER — Telehealth: Payer: Self-pay

## 2018-10-12 ENCOUNTER — Telehealth: Payer: Self-pay

## 2018-10-12 ENCOUNTER — Ambulatory Visit: Payer: Self-pay | Admitting: *Deleted

## 2018-10-12 DIAGNOSIS — I63412 Cerebral infarction due to embolism of left middle cerebral artery: Secondary | ICD-10-CM

## 2018-10-12 NOTE — Chronic Care Management (AMB) (Signed)
  Chronic Care Management   Outreach Note  10/12/2018 Name: Luke Mckee MRN: [id number] DOB: [birth year]  Referred by: Olin Hauser, DO Reason for referral : Chronic Care Management (Unsuccesful Outreach)   An unsuccessful telephone outreach was attempted today. The patient was referred to the case management team by for assistance with care management and care coordination.   Follow Up Plan: A HIPPA compliant phone message was left for the patient providing contact information and requesting a return call.  The care management team will reach out to the patient again over the next 30 days.   Merlene Morse Muriah Harsha RN, BSN Nurse Case Pharmacist, [facility]  [phone]) Business Mobile

## 2018-10-19 ENCOUNTER — Ambulatory Visit: Payer: Medicare Other | Admitting: *Deleted

## 2018-10-19 DIAGNOSIS — I63412 Cerebral infarction due to embolism of left middle cerebral artery: Secondary | ICD-10-CM

## 2018-10-19 NOTE — Chronic Care Management (AMB) (Signed)
Chronic Care Management   Follow Up Note   10/19/2018 Name: Luke Mckee MRN: [id number] DOB: [birth year]  Referred by: Olin Hauser, DO Reason for referral : Chronic Care Management (CVA)   Luke Mckee is a 78 y.o. year old male who is a primary care patient of Olin Hauser, DO. The CCM team was consulted for assistance with chronic disease management and care coordination needs.    Placed a call to Luke Mckee. Patient stated he was having a bad day and did not wish to continue the conversation today. He requested I call back at another time. I inquired if there was anything I could do to help him and he stated No.   Review of patient status, including review of consultants reports, relevant laboratory and other test results, and collaboration with appropriate care team members and the patient's provider was performed as part of comprehensive patient evaluation and provision of chronic care management services.    SDOH (Social Determinants of Health) screening performed today: None. See Care Plan for related entries.   Advanced Directives Status: N See Care Plan and Vynca application for related entries.  Outpatient Encounter Medications as of 10/19/2018  Medication Sig Note  . acetaminophen (TYLENOL 8 HOUR) 650 MG CR tablet Take 1,300 mg by mouth at bedtime as needed.    Marland Kitchen aspirin 81 MG chewable tablet Chew 81 mg by mouth daily.   Marland Kitchen atenolol (TENORMIN) 50 MG tablet TAKE 1 AND 1/2 TABLETS BY MOUTH DAILY (Patient taking differently: 50 mg at bedtime. )   . atorvastatin (LIPITOR) 80 MG tablet TAKE 1 TABLET BY MOUTH DAILY (Patient taking differently: Take 80 mg by mouth. )   . cetirizine (ZYRTEC) 10 MG tablet Take 10 mg by mouth at bedtime as needed for allergies.    . Cholecalciferol (VITAMIN D) 50 MCG (2000 UT) tablet Take 4,000 Units by mouth daily.   . clopidogrel (PLAVIX) 75 MG tablet Take 1 tablet (75 mg total) by mouth daily.   . diclofenac sodium (VOLTAREN) 1 %  GEL Apply 2 g topically 4 (four) times daily as needed for pain. Foot pain   . docusate sodium (COLACE) 100 MG capsule Take 100 mg by mouth daily as needed for constipation.   . furosemide (LASIX) 80 MG tablet Take 1 tablet (80 mg total) by mouth 2 (two) times daily. 09/27/2018: Takes in morning and afternoon  . isosorbide mononitrate (IMDUR) 30 MG 24 hr tablet TAKE 1 TABLET BY MOUTH DAILY (Patient taking differently: Take 30 mg by mouth daily. ) 09/27/2018: Taking in morning  . levothyroxine (SYNTHROID) 112 MCG tablet TAKE 1 TABLET BY MOUTH DAILY( BEFORE BREAKFAST) (Patient taking differently: Take 112 mcg by mouth daily before breakfast. )   . loratadine (CLARITIN) 10 MG tablet Take 10 mg by mouth daily.   Marland Kitchen LORazepam (ATIVAN) 0.5 MG tablet Take 1 tablet (0.5 mg total) by mouth daily as needed for anxiety or sleep. (Patient not taking: Reported on 09/27/2018)   . Multiple Vitamin (MULTIVITAMIN) tablet Take 1 tablet by mouth daily.   . sertraline (ZOLOFT) 100 MG tablet TAKE 1 AND 1/2 TABLETS BY MOUTH DAILY   . spironolactone (ALDACTONE) 25 MG tablet TAKE 1/2 TABLET BY MOUTH DAILY (Patient taking differently: Take 12.5 mg by mouth daily. )    No facility-administered encounter medications on file as of 10/19/2018.      Goals Addressed   None      The care management team will reach out to the  patient again over the next 30 days.  The patient has been provided with contact information for the care management team and has been advised to call with any health related questions or concerns.    Ma Rings Miski Feldpausch RN, BSN Nurse Case Health and safety inspector [facility]  [phone]) Business Mobile

## 2018-10-20 ENCOUNTER — Ambulatory Visit: Payer: Medicare Other | Admitting: Family Medicine

## 2018-10-25 ENCOUNTER — Encounter: Payer: Self-pay | Admitting: Family Medicine

## 2018-10-25 ENCOUNTER — Other Ambulatory Visit: Payer: Self-pay

## 2018-10-25 ENCOUNTER — Ambulatory Visit (INDEPENDENT_AMBULATORY_CARE_PROVIDER_SITE_OTHER): Payer: Medicare Other | Admitting: Family Medicine

## 2018-10-25 VITALS — BP 102/64 | HR 70 | Temp 98.5°F | Resp 16 | Ht 68.0 in | Wt 242.0 lb

## 2018-10-25 DIAGNOSIS — G8191 Hemiplegia, unspecified affecting right dominant side: Secondary | ICD-10-CM

## 2018-10-25 DIAGNOSIS — I679 Cerebrovascular disease, unspecified: Secondary | ICD-10-CM | POA: Diagnosis not present

## 2018-10-25 DIAGNOSIS — I6930 Unspecified sequelae of cerebral infarction: Secondary | ICD-10-CM | POA: Diagnosis not present

## 2018-10-25 DIAGNOSIS — J300 Vasomotor rhinitis: Secondary | ICD-10-CM | POA: Insufficient documentation

## 2018-10-25 DIAGNOSIS — I10 Essential (primary) hypertension: Secondary | ICD-10-CM

## 2018-10-25 MED ORDER — IPRATROPIUM BROMIDE 0.06 % NA SOLN: 2.0000 | Freq: Three times a day (TID) | NASAL | 2 refills | Status: DC | PRN

## 2018-10-25 NOTE — Addendum Note (Signed)
Addended by: Olin Hauser on: 10/25/2018 01:25 PM   Modules accepted: Level of Service

## 2018-10-25 NOTE — Progress Notes (Signed)
Subjective:    Patient ID: Luke Mckee, male    DOB: 29-Sep-1940, 78 y.o.   MRN: [id number]  Luke Mckee is a 78 y.o. male presenting on 10/25/2018 for Fatigue (FOLLOW UP)   HPI   Follow-up History of CVA with residual deficit, multiple L MCA / TIA history - Last visit with me 07/2018, for same problem, treated with refer to Neuro/Cards, see prior notes for background information. - Interval update with he remains on DAPT Plavix + ASA 81, and has loop recorder per cards, without identified AFib. - Today patient reports doing well but still has head heaviness as described says this is improving gradually, per Dr Malvin Johns says it can be due to weakness in muscle after stroke. He has completed HH PT. He has slow progress but balance is better, last fall >6 wk ago now he can do better anticipating if fall - Remains off Aggrenox, on DAPT with plavix ASA still - K has improved, no new concerns. - Low BP by report, still taking diuretics due to edema per Cardiology Denies any chest pain, new focal weakness, dyspnea, worsening edema, near syncope or syncope dizzy vertigo numbness  Additionally Vasomotor Rhinitis, chronic - Episodic clear drainage, without other complication on allergy meds zyrtec and claritin with relief but still runny nose at times, worse after shower in certain positions etc.  Health Maintenance: UTD Flu vaccine high dose 09/05/18  Depression screen [facility] 2/9 10/25/2018 07/31/2018 07/20/2018  Decreased Interest 0 0 0  Down, Depressed, Hopeless 0 0 0  PHQ - 2 Score 0 0 0  Altered sleeping - 0 0  Tired, decreased energy - 2 2  Change in appetite - 0 0  Feeling bad or failure about yourself  - 2 0  Trouble concentrating - 0 0  Moving slowly or fidgety/restless - 0 2  Suicidal thoughts - 0 0  PHQ-9 Score - 4 4  Difficult doing work/chores - Somewhat difficult Not difficult at all    Social History   Tobacco Use  . Smoking status: Never Smoker  . Smokeless tobacco: Never  Used  Substance Use Topics  . Alcohol use: Not Currently    Comment: past  . Drug use: Never    Review of Systems Per HPI unless specifically indicated above     Objective:    BP 102/64   Pulse 70   Temp 98.5 F (36.9 C) (Oral)   Resp 16   Ht 5\' 8"  (1.727 m)   Wt 242 lb (109.8 kg)   SpO2 98%   BMI 36.80 kg/m   Wt Readings from Last 3 Encounters:  10/25/18 242 lb (109.8 kg)  07/20/18 246 lb (111.6 kg)  06/07/18 247 lb (112 kg)    Physical Exam Vitals signs and nursing note reviewed.  Constitutional:      General: He is not in acute distress.    Appearance: He is well-developed. He is not diaphoretic.     Comments: Well-appearing, comfortable, cooperative  HENT:     Head: Normocephalic and atraumatic.  Eyes:     General:        Right eye: No discharge.        Left eye: No discharge.     Conjunctiva/sclera: Conjunctivae normal.  Cardiovascular:     Rate and Rhythm: Normal rate.  Pulmonary:     Effort: Pulmonary effort is normal.  Musculoskeletal:     Comments: Upper extremity R normal strength ROM L mild reduced 4/5 strength grip and  upper extremity shoulder and ROM mostly intact   Skin:    General: Skin is warm and dry.     Findings: No erythema or rash.  Neurological:     Mental Status: He is alert and oriented to person, place, and time.  Psychiatric:        Behavior: Behavior normal.     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[employer]    Pulse Gen Model JQB34 Reveal LINQ    Pulse Gen Serial Number [id number]    Clinic Name [place] Pulse Generator Type ICM/ILR    Implantable Pulse Generator Implant Date [id number]       Assessment & Plan:   Problem List Items Addressed This Visit    Benign essential  HTN - Primary   Relevant Medications   nitroGLYCERIN (NITROSTAT) 0.4 MG SL tablet   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 (HCC)   Relevant Medications   nitroGLYCERIN (NITROSTAT) 0.4 MG SL tablet   History of cerebrovascular accident (CVA) with residual deficit    Gradual improvement with L hemiplegia Upper Ext > Lower Ext Improved balance, cognitive symptoms S/p lacunar infarct CVA 04/2018, 07/2018 recurrent infarct Followed by [facility] Neuro, off PT now, [facility] Cards loop recorder, no AFib  On DAPT Plavix + ASA 81 Keep follow up as planned, keep improving home exercise regimen for strength and balance      Vasomotor rhinitis   Relevant Medications   ipratropium (ATROVENT) 0.06 % nasal spray    Start Atrovent nasal spray decongestant 2 sprays in each nostril up to 3 times daily PRN  #BP Caution low BP if too low < 100/60 and symptomatic, can consider reducing diuretics, on per Cardiology - seems improved now     Meds ordered this encounter  Medications  . ipratropium (ATROVENT) 0.06 % nasal spray    Sig: Place 2 sprays into both nostrils 3 (three) times daily as needed for rhinitis.    Dispense:  15 mL    Refill:  2     Follow up plan: Return in about 5 months (around 03/25/2019) for 5 month follow-up Neuro history CVA, Cards HTN.   Nobie Putnam, DO [facility] 10/25/2018, 11:57 AM

## 2018-10-25 NOTE — Assessment & Plan Note (Signed)
Gradual improvement with L hemiplegia Upper Ext > Lower Ext Improved balance, cognitive symptoms S/p lacunar infarct CVA 04/2018, 07/2018 recurrent infarct Followed by [facility] Neuro, off PT now, [facility] Cards loop recorder, no AFib  On DAPT Plavix + ASA 81 Keep follow up as planned, keep improving home exercise regimen for strength and balance

## 2018-10-25 NOTE — Patient Instructions (Addendum)
Thank you for coming to the office today.  Start Atrovent nasal spray decongestant 2 sprays in each nostril up to 3 times daily as needed for congestion rhinitis.  Follow up with Neurology as planned, heaviness in head will gradually improve.  If BP too low or lightheaded dizzy or worsening, can contact Cardiologist or [place] we can consider reduce fluid pill   Please schedule a Follow-up Appointment to: Return in about 5 months (around 03/25/2019) for 5 month follow-up Neuro history CVA, Cards HTN.  If you have any other questions or concerns, please feel free to call the office or send a message through [place]. You may also schedule an earlier appointment if necessary.  Additionally, you may be receiving a survey about your experience at our office within a few days to 1 week by e-mail or mail. We value your feedback.  Nobie Putnam, DO [place]

## 2018-11-01 ENCOUNTER — Ambulatory Visit (INDEPENDENT_AMBULATORY_CARE_PROVIDER_SITE_OTHER): Payer: Medicare Other | Admitting: *Deleted

## 2018-11-01 DIAGNOSIS — G459 Transient cerebral ischemic attack, unspecified: Secondary | ICD-10-CM | POA: Diagnosis not present

## 2018-11-02 ENCOUNTER — Telehealth: Payer: Self-pay

## 2018-11-02 LAB — CUP PACEART REMOTE DEVICE CHECK
Date Time Interrogation Session: 20201021173934
Implantable Pulse Generator Implant Date: 20200714

## 2018-11-14 NOTE — Progress Notes (Signed)
Carelink Summary Report / Loop Recorder 

## 2018-11-20 ENCOUNTER — Telehealth: Payer: Self-pay

## 2018-11-20 DIAGNOSIS — H35071 Retinal telangiectasis, right eye: Secondary | ICD-10-CM | POA: Diagnosis not present

## 2018-11-27 ENCOUNTER — Telehealth: Payer: Self-pay | Admitting: Family Medicine

## 2018-11-27 NOTE — Telephone Encounter (Signed)
I left a message asking the patient and spouse to call and schedule AWV (initial) with Tiffany. °

## 2018-12-04 ENCOUNTER — Ambulatory Visit (INDEPENDENT_AMBULATORY_CARE_PROVIDER_SITE_OTHER): Payer: Medicare Other | Admitting: *Deleted

## 2018-12-04 DIAGNOSIS — I6930 Unspecified sequelae of cerebral infarction: Secondary | ICD-10-CM | POA: Diagnosis not present

## 2018-12-05 ENCOUNTER — Other Ambulatory Visit: Payer: Self-pay | Admitting: Family Medicine

## 2018-12-05 DIAGNOSIS — F419 Anxiety disorder, unspecified: Secondary | ICD-10-CM

## 2018-12-05 LAB — CUP PACEART REMOTE DEVICE CHECK
Date Time Interrogation Session: 20201124070835
Implantable Pulse Generator Implant Date: 20200714

## 2018-12-09 ENCOUNTER — Other Ambulatory Visit: Payer: Self-pay | Admitting: Family Medicine

## 2018-12-09 DIAGNOSIS — E782 Mixed hyperlipidemia: Secondary | ICD-10-CM

## 2018-12-12 ENCOUNTER — Other Ambulatory Visit: Payer: Self-pay | Admitting: Family Medicine

## 2018-12-12 DIAGNOSIS — E039 Hypothyroidism, unspecified: Secondary | ICD-10-CM

## 2018-12-14 ENCOUNTER — Other Ambulatory Visit: Payer: Self-pay | Admitting: Family Medicine

## 2018-12-14 DIAGNOSIS — F419 Anxiety disorder, unspecified: Secondary | ICD-10-CM

## 2018-12-14 DIAGNOSIS — I10 Essential (primary) hypertension: Secondary | ICD-10-CM

## 2018-12-14 DIAGNOSIS — I25810 Atherosclerosis of coronary artery bypass graft(s) without angina pectoris: Secondary | ICD-10-CM

## 2018-12-14 MED ORDER — ATENOLOL 50 MG PO TABS: 50.0000 mg | ORAL_TABLET | Freq: Every day | ORAL | 1 refills | Status: DC

## 2018-12-14 MED ORDER — SERTRALINE HCL 100 MG PO TABS: 150.0000 mg | ORAL_TABLET | Freq: Every day | ORAL | 1 refills | Status: DC

## 2018-12-18 ENCOUNTER — Telehealth: Payer: Self-pay

## 2018-12-23 ENCOUNTER — Other Ambulatory Visit: Payer: Self-pay | Admitting: Family Medicine

## 2018-12-23 DIAGNOSIS — I25810 Atherosclerosis of coronary artery bypass graft(s) without angina pectoris: Secondary | ICD-10-CM

## 2018-12-31 NOTE — Addendum Note (Signed)
Addended by: Patsey Berthold on: 12/31/2018 07:29 PM   Modules accepted: Level of Service

## 2019-01-04 ENCOUNTER — Ambulatory Visit (INDEPENDENT_AMBULATORY_CARE_PROVIDER_SITE_OTHER): Payer: Medicare Other | Admitting: *Deleted

## 2019-01-04 DIAGNOSIS — I6930 Unspecified sequelae of cerebral infarction: Secondary | ICD-10-CM | POA: Diagnosis not present

## 2019-01-08 LAB — CUP PACEART REMOTE DEVICE CHECK
Date Time Interrogation Session: 20201226182334
Implantable Pulse Generator Implant Date: 20200714

## 2019-01-19 ENCOUNTER — Telehealth: Payer: Self-pay | Admitting: Family Medicine

## 2019-01-19 NOTE — Telephone Encounter (Signed)
I left a message asking the patient and spouse to call and schedule AWV-I with Tiffany. VDM (DD)

## 2019-01-29 ENCOUNTER — Telehealth: Payer: Self-pay

## 2019-02-05 ENCOUNTER — Ambulatory Visit (INDEPENDENT_AMBULATORY_CARE_PROVIDER_SITE_OTHER): Payer: Medicare Other | Admitting: *Deleted

## 2019-02-05 DIAGNOSIS — I6930 Unspecified sequelae of cerebral infarction: Secondary | ICD-10-CM | POA: Diagnosis not present

## 2019-02-05 LAB — CUP PACEART REMOTE DEVICE CHECK
Date Time Interrogation Session: 20210124235847
Implantable Pulse Generator Implant Date: 20200714

## 2019-02-15 ENCOUNTER — Other Ambulatory Visit: Payer: Self-pay | Admitting: Family Medicine

## 2019-02-15 DIAGNOSIS — J300 Vasomotor rhinitis: Secondary | ICD-10-CM

## 2019-02-23 DIAGNOSIS — Z23 Encounter for immunization: Secondary | ICD-10-CM | POA: Diagnosis not present

## 2019-03-05 ENCOUNTER — Ambulatory Visit (INDEPENDENT_AMBULATORY_CARE_PROVIDER_SITE_OTHER): Payer: Medicare PPO | Admitting: General Practice

## 2019-03-05 ENCOUNTER — Telehealth: Payer: Self-pay | Admitting: General Practice

## 2019-03-05 DIAGNOSIS — I10 Essential (primary) hypertension: Secondary | ICD-10-CM | POA: Diagnosis not present

## 2019-03-05 DIAGNOSIS — I63412 Cerebral infarction due to embolism of left middle cerebral artery: Secondary | ICD-10-CM | POA: Diagnosis not present

## 2019-03-05 DIAGNOSIS — E782 Mixed hyperlipidemia: Secondary | ICD-10-CM

## 2019-03-05 NOTE — Patient Instructions (Signed)
Visit Information  Goals Addressed            This Visit's Progress   . RNCM- I would like to complete my HCPOA (pt-stated)       Current Barriers:  . Patient new to the area, limited resources/areas to complete HCPOA related to Covid-19 closures  [place]):  Marland Kitchen Over the next 60 days, the patient will complete mailed Advance Directive packet with the assistance of RNCM  Interventions: . Interviewed patient about Financial controller and provided education about the importance of completing advanced directives . Mailed the patient an EMMI educational handout on Advance Directives as well as an Emergency planning/management officer . Discussed the Advanced directives but the patient did not elaborate. The patient denies any new questions related to AD.  Patient Self Care Activities:  . Self administers medications as prescribed . Attends all scheduled provider appointments . Performs ADL's independently . Calls provider office for new concerns or questions  Please see past updates related to this goal by clicking on the "Past Updates" button in the selected goal        . RNCM: I have not started exercising yet       CARE PLAN ENTRY (see longtitudinal plan of care for additional care plan information)  Current Barriers:  . Chronic Disease Management support, education, and care coordination needs related to CAD, HTN, HLD, and CVA  Clinical Goal(s) related to CAD, HTN, HLD, and CVA :  Over the next 90 days, patient will:  . Work with the care management team to address educational, disease management, and care coordination needs  . Begin or continue self health monitoring activities as directed today Measure and record blood pressure 4 times per week and start exercise plan per recommendations of neurologist with goal of 15 mins per day . Call provider office for new or worsened signs and symptoms Blood pressure findings outside established parameters, Shortness of  breath, and New or worsened symptom related to HLD and CVA . Call care management team with questions or concerns . Verbalize basic understanding of patient centered plan of care established today  Interventions related to CAD, HTN, HLD, and CVA :  . Evaluation of current treatment plans and patient's adherence to plan as established by provider . Education on writing blood pressure readings down so written log could be taken to the provider office on visits . Assessed patient understanding of disease states . Assessed patient's education and care coordination needs . Assessed patient's exercise patterns. The patient has not started an exercise regimen yet. The neurologist recommended last week the patient start exercising and do 15 minutes per day. Information on exercise to be sent to the patient by MyChart and the EMMI system . Provided disease specific education to patient  . Collaborated with appropriate clinical care team members regarding patient needs . Sent message by chart system to Dr. Parks Ranger requesting the patients medication refills be sent to [facility] with 90 day supply . Instructed the patient and the patients wife to call the cardiologist for the loop recorder device report . Reviewed upcoming appointments: pcp appointment on 04-16-2019 at 11 am.   Patient Self Care Activities related to CAD, HTN, HLD, and CVA :  . Patient is unable to independently self-manage chronic health conditions  Initial goal documentation        Patient verbalizes understanding of instructions provided today.   Telephone follow up appointment with care management team member scheduled for: 04-26-2019 at  1 pm  Noreene Larsson RN, MSN, [facility] [place]: [phone]  DASH Eating Plan DASH stands for "Dietary Approaches to Stop Hypertension." The DASH eating plan is a healthy eating plan that has been shown to  reduce high blood pressure (hypertension). It may also reduce your risk for type 2 diabetes, heart disease, and stroke. The DASH eating plan may also help with weight loss. What are tips for following this plan?  General guidelines  Avoid eating more than 2,300 mg (milligrams) of salt (sodium) a day. If you have hypertension, you may need to reduce your sodium intake to 1,500 mg a day.  Limit alcohol intake to no more than 1 drink a day for nonpregnant women and 2 drinks a day for men. One drink equals 12 oz of beer, 5 oz of wine, or 1 oz of hard liquor.  Work with your health care provider to maintain a healthy body weight or to lose weight. Ask what an ideal weight is for you.  Get at least 30 minutes of exercise that causes your heart to beat faster (aerobic exercise) most days of the week. Activities may include walking, swimming, or biking.  Work with your health care provider or diet and nutrition specialist (dietitian) to adjust your eating plan to your individual calorie needs. Reading food labels   Check food labels for the amount of sodium per serving. Choose foods with less than 5 percent of the Daily Value of sodium. Generally, foods with less than 300 mg of sodium per serving fit into this eating plan.  To find whole grains, look for the word "whole" as the first word in the ingredient list. Shopping  Buy products labeled as "low-sodium" or "no salt added."  Buy fresh foods. Avoid canned foods and premade or frozen meals. Cooking  Avoid adding salt when cooking. Use salt-free seasonings or herbs instead of table salt or sea salt. Check with your health care provider or pharmacist before using salt substitutes.  Do not fry foods. Cook foods using healthy methods such as baking, boiling, grilling, and broiling instead.  Cook with heart-healthy oils, such as olive, canola, soybean, or sunflower oil. Meal planning  Eat a balanced diet that includes: ? 5 or more servings  of fruits and vegetables each day. At each meal, try to fill half of your plate with fruits and vegetables. ? Up to 6-8 servings of whole grains each day. ? Less than 6 oz of lean meat, poultry, or fish each day. A 3-oz serving of meat is about the same size as a deck of cards. One egg equals 1 oz. ? 2 servings of low-fat dairy each day. ? A serving of nuts, seeds, or beans 5 times each week. ? Heart-healthy fats. Healthy fats called Omega-3 fatty acids are found in foods such as flaxseeds and coldwater fish, like sardines, salmon, and mackerel.  Limit how much you eat of the following: ? Canned or prepackaged foods. ? Food that is high in trans fat, such as fried foods. ? Food that is high in saturated fat, such as fatty meat. ? Sweets, desserts, sugary drinks, and other foods with added sugar. ? Full-fat dairy products.  Do not salt foods before eating.  Try to eat at least 2 vegetarian meals each week.  Eat more home-cooked food and less restaurant, buffet, and fast food.  When eating at a restaurant, ask that your food be prepared with  less salt or no salt, if possible. What foods are recommended? The items listed may not be a complete list. Talk with your dietitian about what dietary choices are best for you. Grains Whole-grain or whole-wheat bread. Whole-grain or whole-wheat pasta. Brown rice. [place]. Bulgur. Whole-grain and low-sodium cereals. Pita bread. Low-fat, low-sodium crackers. Whole-wheat flour tortillas. Vegetables Fresh or frozen vegetables (raw, steamed, roasted, or grilled). Low-sodium or reduced-sodium tomato and vegetable juice. Low-sodium or reduced-sodium tomato sauce and tomato paste. Low-sodium or reduced-sodium canned vegetables. Fruits All fresh, dried, or frozen fruit. Canned fruit in natural juice (without added sugar). Meat and other protein foods Skinless chicken or [place]. Ground chicken or [place]. Pork with fat trimmed off. Fish and seafood. Egg  whites. Dried beans, peas, or lentils. Unsalted nuts, nut butters, and seeds. Unsalted canned beans. Lean cuts of beef with fat trimmed off. Low-sodium, lean deli meat. Dairy Low-fat (1%) or fat-free (skim) milk. Fat-free, low-fat, or reduced-fat cheeses. Nonfat, low-sodium ricotta or cottage cheese. Low-fat or nonfat yogurt. Low-fat, low-sodium cheese. Fats and oils Soft margarine without trans fats. Vegetable oil. Low-fat, reduced-fat, or light mayonnaise and salad dressings (reduced-sodium). Canola, safflower, olive, soybean, and sunflower oils. Avocado. Seasoning and other foods Herbs. Spices. Seasoning mixes without salt. Unsalted popcorn and pretzels. Fat-free sweets. What foods are not recommended? The items listed may not be a complete list. Talk with your dietitian about what dietary choices are best for you. Grains Baked goods made with fat, such as croissants, muffins, or some breads. Dry pasta or rice meal packs. Vegetables Creamed or fried vegetables. Vegetables in a cheese sauce. Regular canned vegetables (not low-sodium or reduced-sodium). Regular canned tomato sauce and paste (not low-sodium or reduced-sodium). Regular tomato and vegetable juice (not low-sodium or reduced-sodium). Angie Fava. Olives. Fruits Canned fruit in a light or heavy syrup. Fried fruit. Fruit in cream or butter sauce. Meat and other protein foods Fatty cuts of meat. Ribs. Fried meat. Berniece Salines. Sausage. Bologna and other processed lunch meats. Salami. Fatback. Hotdogs. Bratwurst. Salted nuts and seeds. Canned beans with added salt. Canned or smoked fish. Whole eggs or egg yolks. Chicken or [place] with skin. Dairy Whole or 2% milk, cream, and half-and-half. Whole or full-fat cream cheese. Whole-fat or sweetened yogurt. Full-fat cheese. Nondairy creamers. Whipped toppings. Processed cheese and cheese spreads. Fats and oils Butter. Stick margarine. Lard. Shortening. Ghee. Bacon fat. Tropical oils, such as coconut,  palm kernel, or palm oil. Seasoning and other foods Salted popcorn and pretzels. Onion salt, garlic salt, seasoned salt, table salt, and sea salt. Worcestershire sauce. Tartar sauce. Barbecue sauce. Teriyaki sauce. Soy sauce, including reduced-sodium. Steak sauce. Canned and packaged gravies. Fish sauce. Oyster sauce. Cocktail sauce. Horseradish that you find on the shelf. Ketchup. Mustard. Meat flavorings and tenderizers. Bouillon cubes. Hot sauce and Tabasco sauce. Premade or packaged marinades. Premade or packaged taco seasonings. Relishes. Regular salad dressings. Where to find more information:  National Heart, Lung, and [place]: https://wilson-eaton.com/  American Heart Association: www.heart.org Summary  The DASH eating plan is a healthy eating plan that has been shown to reduce high blood pressure (hypertension). It may also reduce your risk for type 2 diabetes, heart disease, and stroke.  With the DASH eating plan, you should limit salt (sodium) intake to 2,300 mg a day. If you have hypertension, you may need to reduce your sodium intake to 1,500 mg a day.  When on the DASH eating plan, aim to eat more fresh fruits and vegetables, whole grains, lean proteins,  low-fat dairy, and heart-healthy fats.  Work with your health care provider or diet and nutrition specialist (dietitian) to adjust your eating plan to your individual calorie needs. This information is not intended to replace advice given to you by your health care provider. Make sure you discuss any questions you have with your health care provider. Document Revised: 12/10/2016 Document Reviewed: 12/22/2015 Elsevier Patient Education  [place].  Exercise After Stroke Physical activity and exercise are important for stroke recovery. The best type of exercise for you will depend on how your stroke has affected you. Loss of physical ability after a stroke is called functional limitation. This can range from no functional  limitation to severe functional limitation. Work with your stroke care providers to find a safe exercise program that fits your needs and ability. This will depend on your strengths, limitations, fitness, and interests. Questions to ask your health care provider Before you begin any exercise, ask your health care providers: What exercises are safe for you. How many times you should repeat each exercise. How often you should do each exercise. What are the benefits of exercise after a stroke? Exercise after a stroke has many benefits. It can: Improve your heart and lung function. Reduce fat in your blood (cholesterol). Lower your blood pressure. Improve your strength, endurance, balance, and coordination. Give you confidence in your ability to be independent. Reduce your risk for depression and anxiety. Reduce the risk of another stroke. Improve your quality of life. What exercises can improve my strength and balance? Strengthening exercises improve muscle strength and endurance. As your muscles become stronger, you may be able to move better and become more active and independent. You can use weights and elastic bands to do these exercises. To strengthen your shoulder blade muscles: Lie on your back with your arms at your sides. Raise one arm toward the ceiling, keeping your elbow stiff. Try to raise your shoulder blade off the floor and hold that position for about 5 seconds. To strengthen your shoulder and arm muscles: Lie on your back and hold one end of an elastic exercise band in each hand at your waist. The band will provide light resistance. Move one hand to your hip area, and raise your other arm sideways and upward until your hand is above your head. Keep your elbow as straight as you can. To strengthen your upper arm muscles: Lie on your back with your arms at your sides. Place a rolled towel under one elbow. Lift up your hand toward your shoulder. Keep your elbow flat against the  towel. To improve hip balance and control: Lie flat on your back. Keep one leg flat on the floor, and bend the other leg at the knee. Lift the foot of the bent leg off the ground and cross the foot over to rest on the outside of the straight leg. Cross the foot back and forth several times. To improve hip strength and movement: Lie on your back with your knees bent and your feet flat on the floor. Lift your hips off the floor as high as you can. Hold the position for about 5 seconds. Lower your hips back to the floor. To improve hip and knee control: Lie on your back with your knees bent and your feet flat on the floor. Slide your leg out straight by sliding the tip of your heel along the floor. Keeping your heel on the floor, slide your leg back into the bent position. To improve knee  control for walking: Lie on your side with your knees slightly bent. Straighten the leg that is not on the floor. After the leg is straight, bend it at the knee toward your buttocks as far back as you can. To improve balance while walking: Get down on the floor on your hands and knees. Distribute your weight evenly on both arms and legs. Rock back in a diagonal direction, shifting your weight back toward your right foot. Then rock forward toward your left hand. Switch sides and repeat. To improve balance and knee control: Stand up straight and use the edge of a table or chair for support. Take the weight off one leg by bending the knee. With your weight on the other leg, bend and straighten the knee. Repeat several times, bending and straightening your knee with most of your weight on your leg. To improve balance and hip strength: Stand facing a chair or table for support with your legs slightly apart. Shift your weight to one leg while you lift the other leg sideways off the floor, keeping your knee as straight as possible. Shift your weight to the other side and lift the other leg off the floor. How much aerobic exercise  should I get? Aerobic exercise is activity that increases your heart rate and breathing rate, which in turn pumps more blood and oxygen throughout your body. Aerobic exercise can: Increase your energy and stamina. Strengthen your heart and lungs. Lower your blood pressure. For most people, the recommendation is 30-45 minutes of aerobic activity on most days of the week. If you are recovering from a stroke: Ask your stroke care provider how much aerobic activity is safe for you. Start any exercise slowly and gradually increase your activity over time. Examples of aerobic exercise     Walking indoors or outdoors. Walking on a treadmill. Biking. Riding a stationary bike. Swimming. Exercising or walking in a pool. Doing housework or yard work. Dancing. Taking a yoga or tai chi class. Follow these instructions at home: Practice your exercises with your stroke care provider before starting them. When you start exercising, have someone with you who can help if you get tired, lose your balance, or have any trouble. Wear loose, comfortable clothing and supportive shoes with non-skid soles. Drink plenty of water while you exercise. Stop exercising if you feel weak, have trouble breathing, or have pain. Check with your stroke care provider before starting to exercise again. Do not exercise if you feel tired or unwell. Do not exercise in very cold or warm temperatures. Summary Physical activity and exercise are important for stroke recovery. Exercise after a stroke has many benefits that include reducing the risk of another stroke, improving strength and balance, and improving your quality of life. Work with your stroke care provider to find a safe exercise program that will fit your needs and abilities. This information is not intended to replace advice given to you by your health care provider. Make sure you discuss any questions you have with your health care provider. Document Revised:  08/23/2017 Document Reviewed: 08/23/2017 Elsevier Patient Education  [place].

## 2019-03-05 NOTE — Chronic Care Management (AMB) (Signed)
Chronic Care Management   Follow Up Note   03/05/2019 Name: Luke Mckee MRN: [id number] DOB: [birth year]  Referred by: [name], DO Reason for referral : Chronic Care Management (F/U: HTN/HLD/Anxiety/CVA- neuro appointment and AD)   Luke Mckee is a 79 y.o. year old male who is a primary care patient of [name], DO. The CCM team was consulted for assistance with chronic disease management and care coordination needs.    Review of patient status, including review of consultants reports, relevant laboratory and other test results, and collaboration with appropriate care team members and the patient's provider was performed as part of comprehensive patient evaluation and provision of chronic care management services.    SDOH (Social Determinants of Health) assessments performed: Yes SDOH Interventions     Most Recent Value  SDOH Interventions  SDOH Interventions for the Following Domains  Physical Activity  Physical Activity Interventions  Other (Comments) [Neurologist recommended 15 mins a day exercise at visit last week. Education and support]  Alcohol Brief Interventions/Follow-up  AUDIT Score <7 follow-up not indicated       Outpatient Encounter Medications as of 03/05/2019  Medication Sig Note   acetaminophen (TYLENOL 8 HOUR) 650 MG CR tablet Take 1,300 mg by mouth at bedtime as needed.     aspirin 81 MG chewable tablet Chew 81 mg by mouth daily.    atenolol (TENORMIN) 50 MG tablet Take 1 tablet (50 mg total) by mouth at bedtime.    atorvastatin (LIPITOR) 80 MG tablet TAKE 1 TABLET BY MOUTH DAILY    cetirizine (ZYRTEC) 10 MG tablet Take 10 mg by mouth at bedtime.     Cholecalciferol (VITAMIN D) 50 MCG (2000 UT) tablet Take 4,000 Units by mouth daily.    clopidogrel (PLAVIX) 75 MG tablet Take 1 tablet (75 mg total) by mouth daily.    diclofenac sodium (VOLTAREN) 1 % GEL Apply 2 g topically 4 (four) times daily as needed for pain. Foot pain      docusate sodium (COLACE) 100 MG capsule Take 100 mg by mouth daily as needed for constipation.    furosemide (LASIX) 80 MG tablet TAKE 2 TABLETS BY MOUTH DAILY    ipratropium (ATROVENT) 0.06 % nasal spray USE 2 SPRAYS IN EACH NOSTRIL THREE TIMES DAILY AS NEEDED FOR RHINITIS    isosorbide mononitrate (IMDUR) 30 MG 24 hr tablet TAKE 1 TABLET BY MOUTH DAILY (Patient taking differently: Take 30 mg by mouth daily. ) 09/27/2018: Taking in morning   levothyroxine (SYNTHROID) 112 MCG tablet Take 1 tablet (112 mcg total) by mouth daily before breakfast.    loratadine (CLARITIN) 10 MG tablet Take 10 mg by mouth daily.    LORazepam (ATIVAN) 0.5 MG tablet Take 1 tablet (0.5 mg total) by mouth daily as needed for anxiety or sleep.    Multiple Vitamin (MULTIVITAMIN) tablet Take 1 tablet by mouth daily.    nitroGLYCERIN (NITROSTAT) 0.4 MG SL tablet Place under the tongue.    senna (SENOKOT) 8.6 MG tablet Take 1 tablet by mouth daily.    sertraline (ZOLOFT) 100 MG tablet Take 1.5 tablets (150 mg total) by mouth daily.    spironolactone (ALDACTONE) 25 MG tablet TAKE 1/2 TABLET BY MOUTH DAILY (Patient taking differently: Take 12.5 mg by mouth daily. )    No facility-administered encounter medications on file as of 03/05/2019.     Objective:  BP Readings from Last 3 Encounters:  02/28/19 120/75  10/25/18 102/64  07/25/18 124/69    Goals  Addressed            This Visit's Progress    RNCM- I would like to complete my HCPOA (pt-stated)       Current Barriers:   Patient new to the area, limited resources/areas to complete HCPOA related to Covid-19 closures  [place]):   Over the next 60 days, the patient will complete mailed Advance Directive packet with the assistance of RNCM  Interventions:  Interviewed patient about Advanced Directives and provided education about the importance of completing advanced directives  Mailed the patient an EMMI educational  handout on Advance Directives as well as an Forensic scientist packet  Discussed the Advanced directives but the patient did not elaborate. The patient denies any new questions related to AD.  Patient Self Care Activities:   Self administers medications as prescribed  Attends all scheduled provider appointments  Performs ADL's independently  Calls provider office for new concerns or questions  Please see past updates related to this goal by clicking on the "Past Updates" button in the selected goal         RNCM: I have not started exercising yet       CARE PLAN ENTRY (see longtitudinal plan of care for additional care plan information)  Current Barriers:   Chronic Disease Management support, education, and care coordination needs related to CAD, HTN, HLD, and CVA  Clinical Goal(s) related to CAD, HTN, HLD, and CVA :  Over the next 90 days, patient will:   Work with the care management team to address educational, disease management, and care coordination needs   Begin or continue self health monitoring activities as directed today Measure and record blood pressure 4 times per week and start exercise plan per recommendations of neurologist with goal of 15 mins per day  Call provider office for new or worsened signs and symptoms Blood pressure findings outside established parameters, Shortness of breath, and New or worsened symptom related to HLD and CVA  Call care management team with questions or concerns  Verbalize basic understanding of patient centered plan of care established today  Interventions related to CAD, HTN, HLD, and CVA :   Evaluation of current treatment plans and patient's adherence to plan as established by provider  Education on writing blood pressure readings down so written log could be taken to the provider office on visits  Assessed patient understanding of disease states  Assessed patient's education and care coordination needs  Assessed  patient's exercise patterns. The patient has not started an exercise regimen yet. The neurologist recommended last week the patient start exercising and do 15 minutes per day. Information on exercise to be sent to the patient by MyChart and the EMMI system  Provided disease specific education to patient   Collaborated with appropriate clinical care team members regarding patient needs  Sent message by chart system to Dr. Parks Ranger requesting the patients medication refills be sent to [facility] with 90 day supply  Instructed the patient and the patients wife to call the cardiologist for the loop recorder device report  Reviewed upcoming appointments: pcp appointment on 04-16-2019 at 11 am.   Patient Self Care Activities related to CAD, HTN, HLD, and CVA :   Patient is unable to independently self-manage chronic health conditions  Initial goal documentation         Plan:   Telephone follow up appointment with care management team member scheduled for: 04-26-2019 at [place], MSN,  CCM Community Care Coordinator [place]   [facility] [place] Mobile: [phone]

## 2019-03-08 ENCOUNTER — Ambulatory Visit (INDEPENDENT_AMBULATORY_CARE_PROVIDER_SITE_OTHER): Payer: Medicare PPO | Admitting: *Deleted

## 2019-03-08 DIAGNOSIS — I6930 Unspecified sequelae of cerebral infarction: Secondary | ICD-10-CM | POA: Diagnosis not present

## 2019-03-08 LAB — CUP PACEART REMOTE DEVICE CHECK
Date Time Interrogation Session: 20210225002011
Implantable Pulse Generator Implant Date: 20200714

## 2019-03-08 NOTE — Progress Notes (Signed)
ILR Remote 

## 2019-03-09 ENCOUNTER — Other Ambulatory Visit: Payer: Self-pay | Admitting: Family Medicine

## 2019-03-09 DIAGNOSIS — I25810 Atherosclerosis of coronary artery bypass graft(s) without angina pectoris: Secondary | ICD-10-CM

## 2019-03-09 DIAGNOSIS — I10 Essential (primary) hypertension: Secondary | ICD-10-CM

## 2019-03-13 ENCOUNTER — Ambulatory Visit (INDEPENDENT_AMBULATORY_CARE_PROVIDER_SITE_OTHER): Payer: Medicare PPO | Admitting: General Practice

## 2019-03-13 ENCOUNTER — Other Ambulatory Visit: Payer: Self-pay | Admitting: Family Medicine

## 2019-03-13 DIAGNOSIS — I63412 Cerebral infarction due to embolism of left middle cerebral artery: Secondary | ICD-10-CM

## 2019-03-13 DIAGNOSIS — I25810 Atherosclerosis of coronary artery bypass graft(s) without angina pectoris: Secondary | ICD-10-CM

## 2019-03-13 DIAGNOSIS — I10 Essential (primary) hypertension: Secondary | ICD-10-CM

## 2019-03-13 DIAGNOSIS — E782 Mixed hyperlipidemia: Secondary | ICD-10-CM

## 2019-03-13 MED ORDER — ISOSORBIDE MONONITRATE ER 30 MG PO TB24: 30.0000 mg | ORAL_TABLET | Freq: Every day | ORAL | 1 refills | Status: DC

## 2019-03-13 NOTE — Patient Instructions (Signed)
Visit Information  Goals Addressed            This Visit's Progress   . RNCM: I have not started exercising yet       CARE PLAN ENTRY (see longtitudinal plan of care for additional care plan information)  Current Barriers:  . Chronic Disease Management support, education, and care coordination needs related to CAD, HTN, HLD, and CVA  Clinical Goal(s) related to CAD, HTN, HLD, and CVA :  Over the next 90 days, patient will:  . Work with the care management team to address educational, disease management, and care coordination needs  . Begin or continue self health monitoring activities as directed today Measure and record blood pressure 4 times per week and start exercise plan per recommendations of neurologist with goal of 15 mins per day . Call provider office for new or worsened signs and symptoms Blood pressure findings outside established parameters, Shortness of breath, and New or worsened symptom related to HLD and CVA . Call care management team with questions or concerns . Verbalize basic understanding of patient centered plan of care established today  Interventions related to CAD, HTN, HLD, and CVA :  . Evaluation of current treatment plans and patient's adherence to plan as established by provider . Education on writing blood pressure readings down so written log could be taken to the provider office on visits . Assessed patient understanding of disease states . Assessed patient's education and care coordination needs . Assessed patient's exercise patterns. The patient has not started an exercise regimen yet. The neurologist recommended last week the patient start exercising and do 15 minutes per day. Information on exercise to be sent to the patient by MyChart and the EMMI system . Provided disease specific education to patient  . Collaborated with appropriate clinical care team members regarding patient needs . Sent message by chart system to Dr. [name] requesting the  patients medication refills be sent to [facility] with 90 day supply- today he needed a refill for Isosorbide.  Dr. [name] has called and given refill notification to the pharmacy.   . Instructed the patient and the patients wife to call the cardiologist for the loop recorder device report . Reviewed upcoming appointments: pcp appointment on 04-16-2019 at 11 am.  . Call to the patient and the patients wife with information on refill for Isosorbide has been sent to the pharmacy by Dr. [name]  Patient Self Care Activities related to CAD, HTN, HLD, and CVA :  . Patient is unable to independently self-manage chronic health conditions  Please see past updates related to this goal by clicking on the "Past Updates" button in the selected goal         Patient verbalizes understanding of instructions provided today.   The care management team will reach out to the patient again over the next 30 to 60 days.   Luke Denver RN, MSN, CCM Community Care Coordinator [place]  [facility] [place] Mobile: [phone]

## 2019-03-13 NOTE — Chronic Care Management (AMB) (Signed)
Chronic Care Management   Follow Up Note   03/13/2019 Name: Luke Mckee MRN: [id number] DOB: 1940/03/17  Referred by: [name], DO Reason for referral : Chronic Care Management (VM requesting a call back for help with a refill)   Luke Mckee is a 79 y.o. year old male who is a primary care patient of [name], DO. The CCM team was consulted for assistance with chronic disease management and care coordination needs.    Review of patient status, including review of consultants reports, relevant laboratory and other test results, and collaboration with appropriate care team members and the patient's provider was performed as part of comprehensive patient evaluation and provision of chronic care management services.    SDOH (Social Determinants of Health) assessments performed: No See Care Plan activities for detailed interventions related to [facility])     Outpatient Encounter Medications as of 03/13/2019  Medication Sig  . acetaminophen (TYLENOL 8 HOUR) 650 MG CR tablet Take 1,300 mg by mouth at bedtime as needed.   Marland Kitchen aspirin 81 MG chewable tablet Chew 81 mg by mouth daily.  Marland Kitchen atenolol (TENORMIN) 50 MG tablet Take 1 tablet (50 mg total) by mouth at bedtime.  Marland Kitchen atorvastatin (LIPITOR) 80 MG tablet TAKE 1 TABLET BY MOUTH DAILY  . cetirizine (ZYRTEC) 10 MG tablet Take 10 mg by mouth at bedtime.   . Cholecalciferol (VITAMIN D) 50 MCG (2000 UT) tablet Take 4,000 Units by mouth daily.  . clopidogrel (PLAVIX) 75 MG tablet Take 1 tablet (75 mg total) by mouth daily.  . diclofenac sodium (VOLTAREN) 1 % GEL Apply 2 g topically 4 (four) times daily as needed for pain. Foot pain  . docusate sodium (COLACE) 100 MG capsule Take 100 mg by mouth daily as needed for constipation.  . furosemide (LASIX) 80 MG tablet TAKE 2 TABLETS BY MOUTH DAILY  . ipratropium (ATROVENT) 0.06 % nasal spray USE 2 SPRAYS IN EACH NOSTRIL THREE TIMES DAILY AS NEEDED FOR RHINITIS  . isosorbide  mononitrate (IMDUR) 30 MG 24 hr tablet Take 1 tablet (30 mg total) by mouth daily.  Marland Kitchen levothyroxine (SYNTHROID) 112 MCG tablet Take 1 tablet (112 mcg total) by mouth daily before breakfast.  . loratadine (CLARITIN) 10 MG tablet Take 10 mg by mouth daily.  Marland Kitchen LORazepam (ATIVAN) 0.5 MG tablet Take 1 tablet (0.5 mg total) by mouth daily as needed for anxiety or sleep.  . Multiple Vitamin (MULTIVITAMIN) tablet Take 1 tablet by mouth daily.  . nitroGLYCERIN (NITROSTAT) 0.4 MG SL tablet Place under the tongue.  . senna (SENOKOT) 8.6 MG tablet Take 1 tablet by mouth daily.  . sertraline (ZOLOFT) 100 MG tablet Take 1.5 tablets (150 mg total) by mouth daily.  Marland Kitchen spironolactone (ALDACTONE) 25 MG tablet Take 0.5 tablets (12.5 mg total) by mouth daily.   No facility-administered encounter medications on file as of 03/13/2019.     Objective:   Goals Addressed            This Visit's Progress   . RNCM: I have not started exercising yet       CARE PLAN ENTRY (see longtitudinal plan of care for additional care plan information)  Current Barriers:  . Chronic Disease Management support, education, and care coordination needs related to CAD, HTN, HLD, and CVA  Clinical Goal(s) related to CAD, HTN, HLD, and CVA :  Over the next 90 days, patient will:  . Work with the care management team to address educational, disease management, and  care coordination needs  . Begin or continue self health monitoring activities as directed today Measure and record blood pressure 4 times per week and start exercise plan per recommendations of neurologist with goal of 15 mins per day . Call provider office for new or worsened signs and symptoms Blood pressure findings outside established parameters, Shortness of breath, and New or worsened symptom related to HLD and CVA . Call care management team with questions or concerns . Verbalize basic understanding of patient centered plan of care established today  Interventions  related to CAD, HTN, HLD, and CVA :  . Evaluation of current treatment plans and patient's adherence to plan as established by provider . Education on writing blood pressure readings down so written log could be taken to the provider office on visits . Assessed patient understanding of disease states . Assessed patient's education and care coordination needs . Assessed patient's exercise patterns. The patient has not started an exercise regimen yet. The neurologist recommended last week the patient start exercising and do 15 minutes per day. Information on exercise to be sent to the patient by MyChart and the EMMI system . Provided disease specific education to patient  . Collaborated with appropriate clinical care team members regarding patient needs . Sent message by chart system to Dr. Parks Ranger requesting the patients medication refills be sent to [facility] with 90 day supply- today he needed a refill for Isosorbide.  Dr. Parks Ranger has called and given refill notification to the pharmacy.   . Instructed the patient and the patients wife to call the cardiologist for the loop recorder device report . Reviewed upcoming appointments: pcp appointment on 04-16-2019 at 11 am.  . Call to the patient and the patients wife with information on refill for Isosorbide has been sent to the pharmacy by Dr. Parks Ranger  Patient Self Care Activities related to CAD, HTN, HLD, and CVA :  . Patient is unable to independently self-manage chronic health conditions  Please see past updates related to this goal by clicking on the "Past Updates" button in the selected goal          Plan:   The care management team will reach out to the patient again over the next 30 to 60 days.    Noreene Larsson RN, MSN, [place] Mobile: [phone]

## 2019-03-14 ENCOUNTER — Ambulatory Visit: Payer: Self-pay | Admitting: Pharmacist

## 2019-03-14 NOTE — Chronic Care Management (AMB) (Signed)
  Chronic Care Management   Follow Up Note   03/14/2019 Name: Jamire Shabazz MRN: [id number] DOB: 01-21-1940  Referred by: [name], DO Reason for referral : Chronic Care Management (Patient Phone Call)   Devonne Lalani is a 79 y.o. year old male who is a primary care patient of [name], DO. The CCM team was consulted for assistance with chronic disease management and care coordination needs.    Receive coordination of care message from CCM Nurse Case Manager asking me to reach out to patient's wife regarding patient's medication management.  Was unable to reach patient via telephone today and have left HIPAA compliant voicemail asking patient to return my call.   Plan  The care management team will reach out to the patient again over the next 30 days.   Duanne Moron, PharmD, [facility] Clinical Pharmacist [facility] [employer] [phone]

## 2019-03-16 ENCOUNTER — Ambulatory Visit: Payer: Self-pay | Admitting: Pharmacist

## 2019-03-16 ENCOUNTER — Other Ambulatory Visit: Payer: Self-pay | Admitting: Family Medicine

## 2019-03-16 DIAGNOSIS — I10 Essential (primary) hypertension: Secondary | ICD-10-CM

## 2019-03-16 DIAGNOSIS — I25810 Atherosclerosis of coronary artery bypass graft(s) without angina pectoris: Secondary | ICD-10-CM

## 2019-03-16 MED ORDER — FUROSEMIDE 80 MG PO TABS: 80.0000 mg | ORAL_TABLET | Freq: Two times a day (BID) | ORAL | 1 refills | Status: DC

## 2019-03-16 NOTE — Chronic Care Management (AMB) (Signed)
Chronic Care Management   Follow Up Note   03/16/2019 Name: Luke Mckee MRN: [id number] DOB: [birth year]  Referred by: Luke Cords, DO Reason for referral : Chronic Care Management (Patient Phone Call)   Luke Mckee is a 79 y.o. year old male who is a primary care patient of Luke Cords, DO. The CCM team was consulted for assistance with chronic disease management and care coordination needs.    Receive coordination of care message from CCM Nurse Case Manager asking me to reach out to patient's wife regarding patient's medication management.  Receive a voicemail from Mrs. Luke Mckee returning my call.  I reached out to Mrs. Luke Mckee by phone today.   Review of patient status, including review of consultants reports, relevant laboratory and other test results, and collaboration with appropriate care team members and the patient's provider was performed as part of comprehensive patient evaluation and provision of chronic care management services.     Outpatient Encounter Medications as of 03/16/2019  Medication Sig  . atenolol (TENORMIN) 50 MG tablet Take 1 tablet (50 mg total) by mouth at bedtime.  . furosemide (LASIX) 80 MG tablet TAKE 2 TABLETS BY MOUTH DAILY (Patient taking differently: 80 mg 2 (two) times daily. )  . isosorbide mononitrate (IMDUR) 30 MG 24 hr tablet Take 1 tablet (30 mg total) by mouth daily.  Marland Kitchen spironolactone (ALDACTONE) 25 MG tablet Take 0.5 tablets (12.5 mg total) by mouth daily.  Marland Kitchen acetaminophen (TYLENOL 8 HOUR) 650 MG CR tablet Take 1,300 mg by mouth at bedtime as needed.   Marland Kitchen aspirin 81 MG chewable tablet Chew 81 mg by mouth daily.  Marland Kitchen atorvastatin (LIPITOR) 80 MG tablet TAKE 1 TABLET BY MOUTH DAILY  . cetirizine (ZYRTEC) 10 MG tablet Take 10 mg by mouth at bedtime.   . Cholecalciferol (VITAMIN D) 50 MCG (2000 UT) tablet Take 4,000 Units by mouth daily.  . clopidogrel (PLAVIX) 75 MG tablet Take 1 tablet (75 mg total) by mouth daily.  . diclofenac  sodium (VOLTAREN) 1 % GEL Apply 2 g topically 4 (four) times daily as needed for pain. Foot pain  . docusate sodium (COLACE) 100 MG capsule Take 100 mg by mouth daily as needed for constipation.  Marland Kitchen ipratropium (ATROVENT) 0.06 % nasal spray USE 2 SPRAYS IN EACH NOSTRIL THREE TIMES DAILY AS NEEDED FOR RHINITIS  . levothyroxine (SYNTHROID) 112 MCG tablet Take 1 tablet (112 mcg total) by mouth daily before breakfast.  . loratadine (CLARITIN) 10 MG tablet Take 10 mg by mouth daily.  Marland Kitchen LORazepam (ATIVAN) 0.5 MG tablet Take 1 tablet (0.5 mg total) by mouth daily as needed for anxiety or sleep.  . Multiple Vitamin (MULTIVITAMIN) tablet Take 1 tablet by mouth daily.  . nitroGLYCERIN (NITROSTAT) 0.4 MG SL tablet Place under the tongue.  . senna (SENOKOT) 8.6 MG tablet Take 1 tablet by mouth daily.  . sertraline (ZOLOFT) 100 MG tablet Take 1.5 tablets (150 mg total) by mouth daily.   No facility-administered encounter medications on file as of 03/16/2019.    Goals Addressed            This Visit's Progress   . PharmD - Medication Management       Current Barriers:  . Chronic Disease Management support, education, and care coordination needs related to CAD, HTN, HLD, and CVA  Pharmacist Clinical Goal(s):  Marland Kitchen Over the next 30 days, patient will work with CM Pharmacist to address medication management needs  Interventions: . Will collaborate with  PCP regarding refill request o Luke Mckee requests a 90-day supply prescription of patient's furosemide for convenience  . Counsel wife on navigating healthcare o Recommend f/u to pharmacy to request medicaiton syncrinization . Counsel on importance of blood pressure monitoring and control o Reports patient taking: - Atenolol 50 mg daily at bedtime - Furosemide 80 mg twice daily - Isosorbide ER 30 mg once daily - Spironolactone 25 mg - 1/2 tablet (12.5 mg) once daily o Reports checks Luke Mckee blood pressure occasionally. Last checked ~2 weeks ago;  reports reading was 120/70 o Encourage checking blood pressure regularly as directed by providers, keeping log and bringing log with patient to his medical appointments . Encourage to schedule f/u visit with Cardiology for patient  Patient Self Care Activities:  . Attends all scheduled provider appointments . Calls pharmacy for medication refills . Calls provider office for new concerns or questions  Please see past updates related to this goal by clicking on the "Past Updates" button in the selected goal         Plan  The care management team will reach out to the patient again over the next 14 days.   Harlow Asa, PharmD, [place] [employer] [phone]

## 2019-03-16 NOTE — Patient Instructions (Signed)
Thank you allowing the Chronic Care Management Team to be a part of your care! It was a pleasure speaking with you today!     CCM (Chronic Care Management) Team    Alto Denver RN, MSN, CCM Nurse Care Coordinator  [phone]   Duanne Moron PharmD  Clinical Pharmacist  [phone]   Dickie La LCSW Clinical Social Worker [phone]  Visit Information  Goals Addressed            This Visit's Progress   . PharmD - Medication Management       Current Barriers:  . Chronic Disease Management support, education, and care coordination needs related to CAD, HTN, HLD, and CVA  Pharmacist Clinical Goal(s):  Marland Kitchen Over the next 30 days, patient will work with CM Pharmacist to address medication management needs  Interventions: . Will collaborate with PCP regarding refill request o Mrs. Canul requests a 90-day supply prescription of patient's furosemide for convenience  . Counsel wife on navigating healthcare o Recommend f/u to pharmacy to request medicaiton syncrinization . Counsel on importance of blood pressure monitoring and control o Reports patient taking: - Atenolol 50 mg daily at bedtime - Furosemide 80 mg twice daily - Isosorbide ER 30 mg once daily - Spironolactone 25 mg - 1/2 tablet (12.5 mg) once daily o Reports checks Mr. Rohrman blood pressure occasionally. Last checked ~2 weeks ago; reports reading was 120/70 o Encourage checking blood pressure regularly as directed by providers, keeping log and bringing log with patient to his medical appointments . Encourage to schedule f/u visit with Cardiology for patient  Patient Self Care Activities:  . Attends all scheduled provider appointments . Calls pharmacy for medication refills . Calls provider office for new concerns or questions  Please see past updates related to this goal by clicking on the "Past Updates" button in the selected goal         Patient verbalizes understanding of instructions provided  today.   The care management team will reach out to the patient again over the next 14 days.   Duanne Moron, PharmD, [facility] Clinical Pharmacist [facility] Medical [employer] [phone]

## 2019-03-30 ENCOUNTER — Ambulatory Visit: Payer: Self-pay | Admitting: Pharmacist

## 2019-03-30 DIAGNOSIS — E782 Mixed hyperlipidemia: Secondary | ICD-10-CM

## 2019-03-30 DIAGNOSIS — I10 Essential (primary) hypertension: Secondary | ICD-10-CM

## 2019-03-30 DIAGNOSIS — I63412 Cerebral infarction due to embolism of left middle cerebral artery: Secondary | ICD-10-CM

## 2019-03-30 NOTE — Chronic Care Management (AMB) (Signed)
Chronic Care Management   Follow Up Note   03/30/2019 Name: Luke Mckee MRN: [id number] DOB: [birth year]  Referred by: Luke Cords, DO Reason for referral : Chronic Care Management (Patient/caregiver Phone Call)   [name] is a 79 y.o. year old male who is a primary care patient of Luke Cords, DO. The CCM team was consulted for assistance with chronic disease management and care coordination needs.    I reached out to patient's wife/caregiver, Mrs. Luke Mckee, today.  Review of patient status, including review of consultants reports, relevant laboratory and other test results, and collaboration with appropriate care team members and the patient's provider was performed as part of comprehensive patient evaluation and provision of chronic care management services.      Outpatient Encounter Medications as of 03/30/2019  Medication Sig  . atenolol (TENORMIN) 50 MG tablet Take 1 tablet (50 mg total) by mouth at bedtime.  . furosemide (LASIX) 80 MG tablet Take 1 tablet (80 mg total) by mouth 2 (two) times daily.  . isosorbide mononitrate (IMDUR) 30 MG 24 hr tablet Take 1 tablet (30 mg total) by mouth daily.  Luke Mckee spironolactone (ALDACTONE) 25 MG tablet Take 0.5 tablets (12.5 mg total) by mouth daily.  Luke Mckee acetaminophen (TYLENOL 8 HOUR) 650 MG CR tablet Take 1,300 mg by mouth at bedtime as needed.   Luke Mckee aspirin 81 MG chewable tablet Chew 81 mg by mouth daily.  Luke Mckee atorvastatin (LIPITOR) 80 MG tablet TAKE 1 TABLET BY MOUTH DAILY  . cetirizine (ZYRTEC) 10 MG tablet Take 10 mg by mouth at bedtime.   . Cholecalciferol (VITAMIN D) 50 MCG (2000 UT) tablet Take 4,000 Units by mouth daily.  . clopidogrel (PLAVIX) 75 MG tablet Take 1 tablet (75 mg total) by mouth daily.  . diclofenac sodium (VOLTAREN) 1 % GEL Apply 2 g topically 4 (four) times daily as needed for pain. Foot pain  . docusate sodium (COLACE) 100 MG capsule Take 100 mg by mouth daily as needed for constipation.  Luke Mckee  ipratropium (ATROVENT) 0.06 % nasal spray USE 2 SPRAYS IN EACH NOSTRIL THREE TIMES DAILY AS NEEDED FOR RHINITIS  . levothyroxine (SYNTHROID) 112 MCG tablet Take 1 tablet (112 mcg total) by mouth daily before breakfast.  . loratadine (CLARITIN) 10 MG tablet Take 10 mg by mouth daily.  Luke Mckee LORazepam (ATIVAN) 0.5 MG tablet Take 1 tablet (0.5 mg total) by mouth daily as needed for anxiety or sleep.  . Multiple Vitamin (MULTIVITAMIN) tablet Take 1 tablet by mouth daily.  . nitroGLYCERIN (NITROSTAT) 0.4 MG SL tablet Place under the tongue.  . senna (SENOKOT) 8.6 MG tablet Take 1 tablet by mouth daily.  . sertraline (ZOLOFT) 100 MG tablet Take 1.5 tablets (150 mg total) by mouth daily.   No facility-administered encounter medications on file as of 03/30/2019.    Goals Addressed            This Visit's Progress   . PharmD - Medication Management       Current Barriers:  . Chronic Disease Management support, education, and care coordination needs related to CAD, HTN, HLD, and CVA  Pharmacist Clinical Goal(s):  Luke Mckee Over the next 30 days, patient will work with CM Pharmacist to address medication management needs  Interventions: . Counsel wife on navigating healthcare o Luke Mckee reports spoke with pharmacy and [facility] currently working on [employer] syncrinization for patient . Counsel on importance of blood pressure monitoring and control o Reports patient taking: - Atenolol 50 mg daily  at bedtime - Furosemide 80 mg twice daily - Isosorbide ER 30 mg once daily - Spironolactone 25 mg - 1/2 tablet (12.5 mg) once daily o Reports unable to locate BP log , but recalls patient's recent BP results ~120s/60-70s,  o Again encourage checking blood pressure regularly as directed by providers, keeping log and bringing log with patient to his medical appointments . Will mail patient BP log as requested, as well as education handout on BP monitoring technique . Again encourage to schedule f/u visit with  Cardiology for patient. Provide Luke Mckee with phone number  Patient Self Care Activities:  . Attends all scheduled provider appointments o Next PCP appointment on 4/5 . Calls pharmacy for medication refills . Calls provider office for new concerns or questions  Please see past updates related to this goal by clicking on the "Past Updates" button in the selected goal        Plan  The care management team will reach out to the patient again over the next 60 days.   Luke Mckee, PharmD, [place] [employer] [phone]

## 2019-03-30 NOTE — Patient Instructions (Signed)
Thank you allowing the Chronic Care Management Team to be a part of your care! It was a pleasure speaking with you today!     CCM (Chronic Care Management) Team    Alto Denver RN, MSN, CCM Nurse Care Coordinator  [phone]   Duanne Moron PharmD  Clinical Pharmacist  [phone]   Dickie La LCSW Clinical Social Worker [phone]  Visit Information  Goals Addressed            This Visit's Progress   . PharmD - Medication Management       Current Barriers:  . Chronic Disease Management support, education, and care coordination needs related to CAD, HTN, HLD, and CVA  Pharmacist Clinical Goal(s):  Marland Kitchen Over the next 30 days, patient will work with CM Pharmacist to address medication management needs  Interventions: . Counsel wife on navigating healthcare o Mrs. Beem reports spoke with pharmacy and [facility] currently working on [employer] syncrinization for patient . Counsel on importance of blood pressure monitoring and control o Reports patient taking: - Atenolol 50 mg daily at bedtime - Furosemide 80 mg twice daily - Isosorbide ER 30 mg once daily - Spironolactone 25 mg - 1/2 tablet (12.5 mg) once daily o Reports unable to locate BP log , but recalls patient's recent BP results ~120s/60-70s,  o Again encourage checking blood pressure regularly as directed by providers, keeping log and bringing log with patient to his medical appointments . Will mail patient BP log as requested, as well as education handout on BP monitoring technique . Again encourage to schedule f/u visit with Cardiology for patient. Provide Mrs. Stith with phone number  Patient Self Care Activities:  . Attends all scheduled provider appointments o Next PCP appointment on 4/5 . Calls pharmacy for medication refills . Calls provider office for new concerns or questions  Please see past updates related to this goal by clicking on the "Past Updates" button in the selected goal        Patient verbalizes understanding of instructions provided today.   The care management team will reach out to the patient again over the next 60 days.   Duanne Moron, PharmD, [facility] Clinical Pharmacist [facility] [employer] [phone]

## 2019-04-08 LAB — CUP PACEART REMOTE DEVICE CHECK
Date Time Interrogation Session: 20210328024333
Implantable Pulse Generator Implant Date: 20200714

## 2019-04-09 ENCOUNTER — Ambulatory Visit (INDEPENDENT_AMBULATORY_CARE_PROVIDER_SITE_OTHER): Payer: Medicare PPO | Admitting: *Deleted

## 2019-04-09 DIAGNOSIS — I6930 Unspecified sequelae of cerebral infarction: Secondary | ICD-10-CM | POA: Diagnosis not present

## 2019-04-09 NOTE — Progress Notes (Signed)
ILR Remote 

## 2019-04-16 ENCOUNTER — Ambulatory Visit (INDEPENDENT_AMBULATORY_CARE_PROVIDER_SITE_OTHER): Payer: Medicare PPO | Admitting: Family Medicine

## 2019-04-16 ENCOUNTER — Encounter: Payer: Self-pay | Admitting: Family Medicine

## 2019-04-16 ENCOUNTER — Other Ambulatory Visit: Payer: Self-pay

## 2019-04-16 ENCOUNTER — Other Ambulatory Visit: Payer: Self-pay | Admitting: Family Medicine

## 2019-04-16 VITALS — BP 108/62 | HR 69 | Temp 97.7°F | Resp 16 | Ht 68.0 in | Wt 246.6 lb

## 2019-04-16 DIAGNOSIS — R7303 Prediabetes: Secondary | ICD-10-CM

## 2019-04-16 DIAGNOSIS — E039 Hypothyroidism, unspecified: Secondary | ICD-10-CM

## 2019-04-16 DIAGNOSIS — F419 Anxiety disorder, unspecified: Secondary | ICD-10-CM | POA: Diagnosis not present

## 2019-04-16 DIAGNOSIS — E669 Obesity, unspecified: Secondary | ICD-10-CM

## 2019-04-16 DIAGNOSIS — Z9989 Dependence on other enabling machines and devices: Secondary | ICD-10-CM

## 2019-04-16 DIAGNOSIS — I679 Cerebrovascular disease, unspecified: Secondary | ICD-10-CM

## 2019-04-16 DIAGNOSIS — E782 Mixed hyperlipidemia: Secondary | ICD-10-CM

## 2019-04-16 DIAGNOSIS — G4733 Obstructive sleep apnea (adult) (pediatric): Secondary | ICD-10-CM

## 2019-04-16 DIAGNOSIS — G8191 Hemiplegia, unspecified affecting right dominant side: Secondary | ICD-10-CM

## 2019-04-16 DIAGNOSIS — Z1283 Encounter for screening for malignant neoplasm of skin: Secondary | ICD-10-CM

## 2019-04-16 DIAGNOSIS — I6930 Unspecified sequelae of cerebral infarction: Secondary | ICD-10-CM

## 2019-04-16 DIAGNOSIS — F3341 Major depressive disorder, recurrent, in partial remission: Secondary | ICD-10-CM | POA: Diagnosis not present

## 2019-04-16 DIAGNOSIS — I10 Essential (primary) hypertension: Secondary | ICD-10-CM

## 2019-04-16 DIAGNOSIS — L989 Disorder of the skin and subcutaneous tissue, unspecified: Secondary | ICD-10-CM

## 2019-04-16 DIAGNOSIS — I25810 Atherosclerosis of coronary artery bypass graft(s) without angina pectoris: Secondary | ICD-10-CM

## 2019-04-16 DIAGNOSIS — R351 Nocturia: Secondary | ICD-10-CM

## 2019-04-16 MED ORDER — NITROGLYCERIN 0.4 MG SL SUBL: 0.4000 mg | SUBLINGUAL_TABLET | SUBLINGUAL | 2 refills | Status: DC | PRN

## 2019-04-16 MED ORDER — FUROSEMIDE 80 MG PO TABS: 80.0000 mg | ORAL_TABLET | Freq: Two times a day (BID) | ORAL | 1 refills | Status: DC

## 2019-04-16 NOTE — Assessment & Plan Note (Signed)
Recent slight worsening mood Chronic problem Controlled mostly on SSRI Sertraline

## 2019-04-16 NOTE — Assessment & Plan Note (Signed)
Encourage lifestyle diet exercise 

## 2019-04-16 NOTE — Assessment & Plan Note (Signed)
Chronic problem Improving overall, but has residual deficits still Followed by Neuro / Cards On DAPT 

## 2019-04-16 NOTE — Assessment & Plan Note (Signed)
Stable chronic problem, without new changes or concerns On DAPT with Plavix + ASA Off Eliquis 

## 2019-04-16 NOTE — Progress Notes (Signed)
Subjective:    Patient ID: Luke Mckee, male    DOB: June 22, 1940, 79 y.o.   MRN: [id number]  Luke Mckee is a 79 y.o. male presenting on 04/16/2019 for Hypertension   HPI   History of CVA with residual deficit R sided weakness / multiple L MCA / TIA history HTN CAD s/p CABG with stable angina on nitrate Followed by Neuro/Cards, see prior notes for background information. S/p loop recorder. No identified AFib. He had issue with stroke on Eliquis. Now he is back on DAPT Plavix + ASA 81mg  and doing better - Low BP by report, still taking diuretics due to edema per Cardiology - lasix 80 BID but only given 30 day supply last time even though it was ordered He takes imdur 30mg  daily and it controls CP. He needs re order on NTG prior expired, rarely takes. Denies any chest pain, new focal weakness, dyspnea, worsening edema, near syncope or syncope dizzy vertigo numbness  Anxiety / Depression recurrent in partial remission Rarely bothering him. Has occasional mood symptoms. Doing well on Sertraline. Also for anxiety, takes Lorazepam rarely, has some left  OSA on CPAP He needs referral to provider who manages CPAP and sleep medicine. [place], he requests new referral today, request new CPAP mask  History of skin lesions Has several areas of pigmented lesions on arms / face sun exposed areas. He is interested to see Dermatologist for consultation evaluate if any abnormal skin lesions or skin cancer.  Depression screen [facility] 2/9 04/16/2019 03/05/2019 10/25/2018  Decreased Interest 1 0 0  Down, Depressed, Hopeless 1 0 0  PHQ - 2 Score 2 0 0  Altered sleeping 0 - -  Tired, decreased energy 2 - -  Change in appetite 0 - -  Feeling bad or failure about yourself  1 - -  Trouble concentrating 2 - -  Moving slowly or fidgety/restless 0 - -  Suicidal thoughts 0 - -  PHQ-9 Score 7 - -  Difficult doing work/chores Not difficult at all - -    Social History   Tobacco  Use  . Smoking status: Never Smoker  . Smokeless tobacco: Never Used  Substance Use Topics  . Alcohol use: Not Currently    Comment: past  . Drug use: Never    Review of Systems Per HPI unless specifically indicated above     Objective:    BP 108/62   Pulse 69   Temp 97.7 F (36.5 C) (Temporal)   Resp 16   Ht 5\' 8"  (1.727 m)   Wt 246 lb 9.6 oz (111.9 kg)   SpO2 98%   BMI 37.50 kg/m   Wt Readings from Last 3 Encounters:  04/16/19 246 lb 9.6 oz (111.9 kg)  02/28/19 242 lb 1 oz (109.8 kg)  10/25/18 242 lb (109.8 kg)    Physical Exam Vitals and nursing note reviewed.  Constitutional:      General: He is not in acute distress.    Appearance: He is well-developed. He is obese. He is not diaphoretic.     Comments: Well-appearing, comfortable, cooperative  HENT:     Head: Normocephalic and atraumatic.  Eyes:     General:        Right eye: No discharge.        Left eye: No discharge.     Conjunctiva/sclera: Conjunctivae normal.  Neck:     Thyroid: No thyromegaly.  Cardiovascular:     Rate and Rhythm:  Normal rate and regular rhythm.     Heart sounds: Normal heart sounds. No murmur.     Comments: No ectopy or abnormal rhythm Pulmonary:     Effort: Pulmonary effort is normal. No respiratory distress.     Breath sounds: Normal breath sounds. No wheezing or rales.  Musculoskeletal:        General: Normal range of motion.     Cervical back: Normal range of motion and neck supple.     Comments: Chronic residual right sided upper and lower extremity weakness  Lymphadenopathy:     Cervical: No cervical adenopathy.  Skin:    General: Skin is warm and dry.     Findings: No erythema or rash.  Neurological:     Mental Status: He is alert and oriented to person, place, and time.  Psychiatric:        Behavior: Behavior normal.     Comments: Well groomed, good eye contact, normal speech and thoughts       Results for orders placed or performed in visit on 04/09/19  CUP  PACEART REMOTE DEVICE CHECK  Result Value Ref Range   Date Time Interrogation Session [id number]    Pulse Generator Manufacturer MERM    Pulse Gen Model YBO17 Reveal LINQ    Pulse Gen Serial Number [id number]    Clinic Name [facility]    Implantable Pulse Generator Type ICM/ILR    Implantable Pulse Generator Implant Date [id number]    Eval Rhythm SB 58       Assessment & Plan:   Problem List Items Addressed This Visit    Recurrent major depressive disorder, in partial remission (HCC)    Recent slight worsening mood Chronic problem Controlled mostly on SSRI Sertraline      Obesity (BMI 35.0-39.9 without comorbidity)    Encourage lifestyle diet exercise      History of cerebrovascular accident (CVA) with residual deficit    Chronic problem Improving overall, but has residual deficits still Followed by Neuro / Cards On DAPT      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 (HCC)    Stable chronic problem, without new changes or concerns On DAPT with Plavix + ASA Off Eliquis      Relevant Medications   furosemide (LASIX) 80 MG tablet   nitroGLYCERIN (NITROSTAT) 0.4 MG SL tablet   Coronary artery disease involving coronary bypass graft of native heart without angina pectoris    Chronic CAD s/p CABG On med management now without anginal symptoms Controlled on med management per [facility] Cards      Relevant Medications   furosemide (LASIX) 80 MG tablet   nitroGLYCERIN (NITROSTAT) 0.4 MG SL tablet   Benign essential HTN    Well-controlled HTN - Home BP readings controlled  Known CAD, prior CVA, OSA   Plan:  1. Continue current BP regimen 2. Encourage improved lifestyle - low sodium diet, regular exercise 3. Continue monitor BP outside office, bring readings to next visit, if persistently >140/90 or new symptoms notify office sooner      Relevant Medications   furosemide (LASIX) 80 MG tablet   nitroGLYCERIN (NITROSTAT) 0.4 MG SL tablet    Anxiety - Primary    Stable, controlled On SSRI On PRN Lorazepam, rarely using. Not due for refill yet, 30 pills >1 year       Other Visit Diagnoses    Skin lesions, generalized       Relevant Orders   Ambulatory referral to Dermatology  Screening for skin cancer       Relevant Orders   Ambulatory referral to Dermatology   OSA on CPAP       Relevant Orders   Ambulatory referral to Pulmonology    Referral to [facility] for sleep medicine management on CPAP, can f/u with them for routine management and update CPAP supplies    Orders Placed This Encounter  Procedures  . Ambulatory referral to Pulmonology    Referral Priority:   Routine    Referral Type:   Consultation    Referral Reason:   Specialty Services Required    Requested Specialty:   Pulmonary Disease    Number of Visits Requested:   1  . Ambulatory referral to Dermatology    Referral Priority:   Routine    Referral Type:   Consultation    Referral Reason:   Specialty Services Required    Requested Specialty:   Dermatology    Number of Visits Requested:   1     Meds ordered this encounter  Medications  . furosemide (LASIX) 80 MG tablet    Sig: Take 1 tablet (80 mg total) by mouth 2 (two) times daily.    Dispense:  180 tablet    Refill:  1  . nitroGLYCERIN (NITROSTAT) 0.4 MG SL tablet    Sig: Place 1 tablet (0.4 mg total) under the tongue every 5 (five) minutes as needed for chest pain.    Dispense:  20 tablet    Refill:  2     Follow up plan: Return in about 6 months (around 10/16/2019) for Yearly Medicare Checkup.  Future labs ordered for 10/12/19   Nobie Putnam, [facility] 04/16/2019, 11:28 AM

## 2019-04-16 NOTE — Assessment & Plan Note (Signed)
Chronic CAD s/p CABG On med management now without anginal symptoms Controlled on med management per [facility] Cards 

## 2019-04-16 NOTE — Assessment & Plan Note (Signed)
Well-controlled HTN - Home BP readings controlled  Known CAD, prior CVA, OSA    Plan:  1. Continue current BP regimen 2. Encourage improved lifestyle - low sodium diet, regular exercise 3. Continue monitor BP outside office, bring readings to next visit, if persistently >140/90 or new symptoms notify office sooner 

## 2019-04-16 NOTE — Patient Instructions (Addendum)
Thank you for coming to the office today.  Ordered Lasix for 180 pills or 90 days.  Referral for CPAP machine evaluation to lung doctors who also do sleep medicine  [facility] Pulmonology [address] [place], [place] [address] Phone: [phone]  Referral to Dermatology for generalized skin check.  [facility]   [address] [place], [place] [address] Hours: 8AM-5PM Phone: [phone]  DUE for FASTING BLOOD WORK (no food or drink after midnight before the lab appointment, only water or coffee without cream/sugar on the morning of)  SCHEDULE "Lab Only" visit in the morning at the clinic for lab draw in 6 MONTHS   - Make sure Lab Only appointment is at about 1 week before your next appointment, so that results will be available  For Lab Results, once available within 2-3 days of blood draw, you can can log in to MyChart online to view your results and a brief explanation. Also, we can discuss results at next follow-up visit.   Please schedule a Follow-up Appointment to: Return in about 6 months (around 10/16/2019) for Yearly Medicare Checkup.  If you have any other questions or concerns, please feel free to call the office or send a message through MyChart. You may also schedule an earlier appointment if necessary.  Additionally, you may be receiving a survey about your experience at our office within a few days to 1 week by e-mail or mail. We value your feedback.  Saralyn Pilar, DO [facility], [place]

## 2019-04-16 NOTE — Assessment & Plan Note (Signed)
Stable, controlled On SSRI On PRN Lorazepam, rarely using. Not due for refill yet, 30 pills >1 year 

## 2019-04-26 ENCOUNTER — Telehealth: Payer: Self-pay | Admitting: General Practice

## 2019-04-26 ENCOUNTER — Ambulatory Visit (INDEPENDENT_AMBULATORY_CARE_PROVIDER_SITE_OTHER): Payer: Medicare PPO | Admitting: General Practice

## 2019-04-26 DIAGNOSIS — I25810 Atherosclerosis of coronary artery bypass graft(s) without angina pectoris: Secondary | ICD-10-CM | POA: Diagnosis not present

## 2019-04-26 DIAGNOSIS — I10 Essential (primary) hypertension: Secondary | ICD-10-CM | POA: Diagnosis not present

## 2019-04-26 DIAGNOSIS — E782 Mixed hyperlipidemia: Secondary | ICD-10-CM | POA: Diagnosis not present

## 2019-04-26 DIAGNOSIS — I6930 Unspecified sequelae of cerebral infarction: Secondary | ICD-10-CM

## 2019-04-26 NOTE — Patient Instructions (Signed)
Visit Information  Goals Addressed            This Visit's Progress   . RNCM: I have not started exercising yet       CARE PLAN ENTRY (see longtitudinal plan of care for additional care plan information)  Current Barriers:  . Chronic Disease Management support, education, and care coordination needs related to CAD, HTN, HLD, and CVA  Clinical Goal(s) related to CAD, HTN, HLD, and CVA :  Over the next 90 days, patient will:  . Work with the care management team to address educational, disease management, and care coordination needs  . Begin or continue self health monitoring activities as directed today Measure and record blood pressure 4 times per week and start exercise plan per recommendations of neurologist with goal of 15 mins per day . Call provider office for new or worsened signs and symptoms Blood pressure findings outside established parameters, Shortness of breath, and New or worsened symptom related to HLD and CVA . Call care management team with questions or concerns . Verbalize basic understanding of patient centered plan of care established today  Interventions related to CAD, HTN, HLD, and CVA :  . Evaluation of current treatment plans and patient's adherence to plan as established by provider . Education on writing blood pressure readings down so written log could be taken to the provider office on visits.  The patients wife is writing the patients blood pressures down. His systolic is usually 470-962 and diastolic is 83-66- a high reading on 4-2 was 140/65 with a pulse of 65.  Per the wife the patient was very active that day. [name].  Oxygen sats are 92-96%.   . Assessed patient understanding of disease states.  Good understanding of chronic diseases and how to effectively manage . Assessed patient's education and care coordination needs . Assessed patient's exercise patterns. The patient has started an exercise regimen, he and his  wife purchased a [place] and he is doing 2000 steps daily. This takes him approximately 30 minutes to do.  He feels this is also helping his balance.  The neurologist recommended last week the patient start exercising and do 15 minutes per day.  . Provided disease specific education to patient  . Collaborated with appropriate clinical care team members regarding patient needs . Sent message by chart system to Dr. Parks Ranger requesting the patients medication refills be sent to [facility] with 90 day supply- today he needed a refill for Isosorbide.  Dr. Parks Ranger has called and given refill notification to the pharmacy. Completed   . Instructed the patient and the patients wife to call the cardiologist for the loop recorder device report. Completed  . Reviewed upcoming appointments: pcp appointment on 10-17-2019 at 2 pm . Call to the patient and the patients wife with information on refill for Isosorbide has been sent to the pharmacy by Dr. Parks Ranger- done  . Assessed the patients complaint of a head could. The patient does sound congested. The patient denies fever. The patient feels the cold is "leaving".  At first he noticed green, then yellow drainage.  The patient states the drainage now is clear. The patient has been using a neti pot and OTC products. The patient has been using guaifenesin DM at night up until the last 2 night he has not needed to take it. The patient educated on what worse looks like and to call the pcp for changes of worsening signs and symptoms.  The patient and the patients wife verbalized understanding.   Patient Self Care Activities related to CAD, HTN, HLD, and CVA :  . Patient is unable to independently self-manage chronic health conditions  Please see past updates related to this goal by clicking on the "Past Updates" button in the selected goal         Patient verbalizes understanding of instructions provided today.   The care management team will reach out to the  patient again over the next 60 days.   Alto Denver RN, MSN, CCM Community Care Coordinator [place]  [facility] [employer] Mobile: [phone]

## 2019-04-26 NOTE — Chronic Care Management (AMB) (Signed)
Chronic Care Management   Follow Up Note   04/26/2019 Name: Luke Mckee MRN: [id number] DOB: 12-23-1940  Referred by: Luke Mckee Reason for referral : Chronic Care Management (RNCM: Chronic Disease Management and Care Coordniation needs)   Luke Mckee is a 79 y.o. year old male who is a primary care patient of Luke Mckee. The CCM team was consulted for assistance with chronic disease management and care coordination needs.    Review of patient status, including review of consultants reports, relevant laboratory and other test results, and collaboration with appropriate care team members and the patient's provider was performed as part of comprehensive patient evaluation and provision of chronic care management services.    SDOH (Social Determinants of Health) assessments performed: Yes See Care Plan activities for detailed interventions related to SDOH)  SDOH Interventions     Most Recent Value  SDOH Interventions  SDOH Interventions for the Following Domains  Physical Activity  Physical Activity Interventions  Other (Comments) [the patient is using a cubii]       Outpatient Encounter Medications as of 04/26/2019  Medication Sig   acetaminophen (TYLENOL 8 HOUR) 650 MG CR tablet Take 1,300 mg by mouth at bedtime.    aspirin 81 MG chewable tablet Chew 81 mg by mouth daily.   atenolol (TENORMIN) 50 MG tablet Take 1 tablet (50 mg total) by mouth at bedtime.   atorvastatin (LIPITOR) 80 MG tablet TAKE 1 TABLET BY MOUTH DAILY   cetirizine (ZYRTEC) 10 MG tablet Take 10 mg by mouth at bedtime.    Cholecalciferol (VITAMIN D) 50 MCG (2000 UT) tablet Take 4,000 Units by mouth daily.   clopidogrel (PLAVIX) 75 MG tablet Take 1 tablet (75 mg total) by mouth daily.   diclofenac sodium (VOLTAREN) 1 % GEL Apply 2 g topically 4 (four) times daily as needed for pain. Foot pain   docusate sodium (COLACE) 100 MG capsule Take 200 mg by mouth daily.     furosemide (LASIX) 80 MG tablet Take 1 tablet (80 mg total) by mouth 2 (two) times daily.   ipratropium (ATROVENT) 0.06 % nasal spray USE 2 SPRAYS IN EACH NOSTRIL THREE TIMES DAILY AS NEEDED FOR RHINITIS   isosorbide mononitrate (IMDUR) 30 MG 24 hr tablet Take 1 tablet (30 mg total) by mouth daily.   levothyroxine (SYNTHROID) 112 MCG tablet Take 1 tablet (112 mcg total) by mouth daily before breakfast.   loratadine (CLARITIN) 10 MG tablet Take 10 mg by mouth daily.   LORazepam (ATIVAN) 0.5 MG tablet Take 1 tablet (0.5 mg total) by mouth daily as needed for anxiety or sleep.   Multiple Vitamin (MULTIVITAMIN) tablet Take 1 tablet by mouth daily.   nitroGLYCERIN (NITROSTAT) 0.4 MG SL tablet Place 1 tablet (0.4 mg total) under the tongue every 5 (five) minutes as needed for chest pain.   PATADAY 0.1 % ophthalmic solution    senna (SENOKOT) 8.6 MG tablet Take 1 tablet by mouth daily.   sertraline (ZOLOFT) 100 MG tablet Take 1.5 tablets (150 mg total) by mouth daily.   spironolactone (ALDACTONE) 25 MG tablet Take 0.5 tablets (12.5 mg total) by mouth daily.   No facility-administered encounter medications on file as of 04/26/2019.     Objective:  BP Readings from Last 3 Encounters:  04/23/19 109/73  04/16/19 108/62  02/28/19 120/75    Goals Addressed            This Visit's Progress    RNCM: I have not  started exercising yet       CARE PLAN ENTRY (see longtitudinal plan of care for additional care plan information)  Current Barriers:   Chronic Disease Management support, education, and care coordination needs related to CAD, HTN, HLD, and CVA  Clinical Goal(s) related to CAD, HTN, HLD, and CVA :  Over the next 90 days, patient will:   Work with the care management team to address educational, disease management, and care coordination needs   Begin or continue self health monitoring activities as directed today Measure and record blood pressure 4 times per week and start  exercise plan per recommendations of neurologist with goal of 15 mins per day  Call provider office for new or worsened signs and symptoms Blood pressure findings outside established parameters, Shortness of breath, and New or worsened symptom related to HLD and CVA  Call care management team with questions or concerns  Verbalize basic understanding of patient centered plan of care established today  Interventions related to CAD, HTN, HLD, and CVA :   Evaluation of current treatment plans and patient's adherence to plan as established by provider  Education on writing blood pressure readings down so written log could be taken to the provider office on visits.  The patients wife is writing the patients blood pressures down. [name].  Per the wife the patient was very active that day. [name].  Oxygen sats are 92-96%.    Assessed patient understanding of disease states.  Good understanding of chronic diseases and how to effectively manage  Assessed patient's education and care coordination needs  Assessed patient's exercise patterns. The patient has started an exercise regimen, he and his wife purchased a [place] and he is doing 2000 steps daily. This takes him approximately 30 minutes to Mckee.  He feels this is also helping his balance.  The neurologist recommended last week the patient start exercising and Mckee 15 minutes per day.   Provided disease specific education to patient   Collaborated with appropriate clinical care team members regarding patient needs  Sent message by chart system to Dr. [name] requesting the patients medication refills be sent to [facility] with 90 day supply- today he needed a refill for Isosorbide.  Dr. [name] has called and given refill notification to the pharmacy. Completed    Instructed the patient and the patients wife  to call the cardiologist for the loop recorder device report. Completed   Reviewed upcoming appointments: pcp appointment on 10-17-2019 at 2 pm  Call to the patient and the patients wife with information on refill for Isosorbide has been sent to the pharmacy by Dr. [name]- done   Assessed the patients complaint of a head could. The patient does sound congested. The patient denies fever. The patient feels the cold is "leaving".  At first he noticed green, then yellow drainage.  The patient states the drainage now is clear. The patient has been using a neti pot and OTC products. The patient has been using guaifenesin DM at night up until the last 2 night he has not needed to take it. The patient educated on what worse looks like and to call the pcp for changes of worsening signs and symptoms. The patient and the patients wife verbalized understanding.   Patient Self Care Activities related to CAD, HTN, HLD, and CVA :   Patient  is unable to independently self-manage chronic health conditions  Please see past updates related to this goal by clicking on the "Past Updates" button in the selected goal          Plan:   The care management team will reach out to the patient again over the next 60 days.   Noreene Larsson RN, MSN, [place] Mobile: [phone]

## 2019-05-07 DIAGNOSIS — H35071 Retinal telangiectasis, right eye: Secondary | ICD-10-CM | POA: Diagnosis not present

## 2019-05-10 LAB — CUP PACEART REMOTE DEVICE CHECK
Date Time Interrogation Session: 20210428231450
Implantable Pulse Generator Implant Date: 20200714

## 2019-05-14 ENCOUNTER — Ambulatory Visit (INDEPENDENT_AMBULATORY_CARE_PROVIDER_SITE_OTHER): Payer: Medicare PPO | Admitting: *Deleted

## 2019-05-14 DIAGNOSIS — G459 Transient cerebral ischemic attack, unspecified: Secondary | ICD-10-CM | POA: Diagnosis not present

## 2019-05-14 NOTE — Progress Notes (Signed)
Carelink Summary Report / Loop Recorder 

## 2019-05-23 ENCOUNTER — Ambulatory Visit: Payer: Self-pay | Admitting: Pharmacist

## 2019-05-23 ENCOUNTER — Telehealth: Payer: Self-pay

## 2019-05-23 NOTE — Chronic Care Management (AMB) (Signed)
  Chronic Care Management   Follow Up Note   05/23/2019 Name: Luke Mckee MRN: [id number] DOB: 08/17/1940  Referred by: [name], DO Reason for referral : Chronic Care Management (Patient Phone Call)   Luke Mckee is a 79 y.o. year old male who is a primary care patient of [name], DO. The CCM team was consulted for assistance with chronic disease management and care coordination needs.    Was unable to reach patient via telephone today and have left HIPAA compliant voicemail asking patient to return my call.   Plan  The care management team will reach out to the patient again over the next 60 days.   Duanne Moron, PharmD, [facility] Clinical Pharmacist [facility] [phone]

## 2019-05-25 ENCOUNTER — Other Ambulatory Visit: Payer: Self-pay | Admitting: Family Medicine

## 2019-05-25 ENCOUNTER — Ambulatory Visit (INDEPENDENT_AMBULATORY_CARE_PROVIDER_SITE_OTHER): Payer: Medicare PPO | Admitting: General Practice

## 2019-05-25 DIAGNOSIS — E782 Mixed hyperlipidemia: Secondary | ICD-10-CM

## 2019-05-25 DIAGNOSIS — I25810 Atherosclerosis of coronary artery bypass graft(s) without angina pectoris: Secondary | ICD-10-CM | POA: Diagnosis not present

## 2019-05-25 DIAGNOSIS — I10 Essential (primary) hypertension: Secondary | ICD-10-CM

## 2019-05-25 DIAGNOSIS — L602 Onychogryphosis: Secondary | ICD-10-CM

## 2019-05-25 NOTE — Patient Instructions (Signed)
Visit Information  Goals Addressed            This Visit's Progress   . RNCM: I have not started exercising yet       CARE PLAN ENTRY (see longtitudinal plan of care for additional care plan information)  Current Barriers:  . Chronic Disease Management support, education, and care coordination needs related to CAD, HTN, HLD, and CVA  Clinical Goal(s) related to CAD, HTN, HLD, and CVA :  Over the next 90 days, patient will:  . Work with the care management team to address educational, disease management, and care coordination needs  . Begin or continue self health monitoring activities as directed today Measure and record blood pressure 4 times per week and start exercise plan per recommendations of neurologist with goal of 15 mins per day . Call provider office for new or worsened signs and symptoms Blood pressure findings outside established parameters, Shortness of breath, and New or worsened symptom related to HLD and CVA . Call care management team with questions or concerns . Verbalize basic understanding of patient centered plan of care established today  Interventions related to CAD, HTN, HLD, and CVA :  . Evaluation of current treatment plans and patient's adherence to plan as established by provider . Education on writing blood pressure readings down so written log could be taken to the provider office on visits.  The patients wife is writing the patients blood pressures down. His systolic is usually 810-175 and diastolic is 10-25- a high reading on 4-2 was 140/65 with a pulse of 65.  Per the wife the patient was very active that day. [name].  Oxygen sats are 92-96%.   . Assessed patient understanding of disease states.  Good understanding of chronic diseases and how to effectively manage . Assessed patient's education and care coordination needs . Assessed patient's exercise patterns. The patient has started an exercise regimen, he and his  wife purchased a [place] and he is doing 2000 steps daily. This takes him approximately 30 minutes to do.  He feels this is also helping his balance.  The neurologist recommended last week the patient start exercising and do 15 minutes per day. Per the wife he does take some days off but he is still continuing to use regularly.  . Provided disease specific education to patient. Education and review. Endorses compliance . Collaborated with appropriate clinical care team members regarding patient needs . Sent message by chart system to Dr. Parks Ranger requesting the patients medication refills be sent to [facility] with 90 day supply- today he needed a refill for Isosorbide.  Dr. Parks Ranger has called and given refill notification to the pharmacy. Completed   . Instructed the patient and the patients wife to call the cardiologist for the loop recorder device report. Completed  . Reviewed upcoming appointments: pcp appointment on 10-17-2019 at 2 pm . Call to the patient and the patients wife with information on refill for Isosorbide has been sent to the pharmacy by Dr. Parks Ranger- done . Education on call back to the patient that Dr. Parks Ranger has sent a referral to [place] specialist in [place]. Information provided that they will call the patient for appointment information. The wife took down their address and number. She verbalized understanding of them reaching out to the patient for an appointment. The patient had called earlier asking for a referral to see a podiatrist for foot care and nails trimmed. [facility] to [facility].  . Evaluation of visit to the [place].  The patients visit went well. The patient seems to be stable at this time. Will continue to monitor for changes.   CAD, HTN, HLD, and CVA :  . Patient is unable to independently self-manage chronic health conditions  Please see past updates related to this goal by clicking on the "Past  Updates" button in the selected goal         Patient verbalizes understanding of instructions provided today.   The care management team will reach out to the patient again over the next 60 days.   Alto Denver RN, MSN, CCM Community Care Coordinator [place]  [facility] [place] Mobile: [phone]

## 2019-05-25 NOTE — Chronic Care Management (AMB) (Signed)
Chronic Care Management   Follow Up Note   05/25/2019 Name: Luke Mckee MRN: [id number] DOB: 12-27-1940  Referred by: [name], DO Reason for referral : Chronic Care Management (call back for referral to podiatrist. HTN/CAD/CVA)   Luke Mckee is a 79 y.o. year old male who is a primary care patient of [name], DO. The CCM team was consulted for assistance with chronic disease management and care coordination needs.    Review of patient status, including review of consultants reports, relevant laboratory and other test results, and collaboration with appropriate care team members and the patient's provider was performed as part of comprehensive patient evaluation and provision of chronic care management services.    SDOH (Social Determinants of Health) assessments performed: No See Care Plan activities for detailed interventions related to [facility])     Outpatient Encounter Medications as of 05/25/2019  Medication Sig  . acetaminophen (TYLENOL 8 HOUR) 650 MG CR tablet Take 1,300 mg by mouth at bedtime.   Marland Kitchen aspirin 81 MG chewable tablet Chew 81 mg by mouth daily.  Marland Kitchen atenolol (TENORMIN) 50 MG tablet Take 1 tablet (50 mg total) by mouth at bedtime.  Marland Kitchen atorvastatin (LIPITOR) 80 MG tablet TAKE 1 TABLET BY MOUTH DAILY  . cetirizine (ZYRTEC) 10 MG tablet Take 10 mg by mouth at bedtime.   . Cholecalciferol (VITAMIN D) 50 MCG (2000 UT) tablet Take 4,000 Units by mouth daily.  . clopidogrel (PLAVIX) 75 MG tablet Take 1 tablet (75 mg total) by mouth daily.  . diclofenac sodium (VOLTAREN) 1 % GEL Apply 2 g topically 4 (four) times daily as needed for pain. Foot pain  . docusate sodium (COLACE) 100 MG capsule Take 200 mg by mouth daily.   . furosemide (LASIX) 80 MG tablet Take 1 tablet (80 mg total) by mouth 2 (two) times daily.  Marland Kitchen ipratropium (ATROVENT) 0.06 % nasal spray USE 2 SPRAYS IN EACH NOSTRIL THREE TIMES DAILY AS NEEDED FOR RHINITIS  . isosorbide mononitrate  (IMDUR) 30 MG 24 hr tablet Take 1 tablet (30 mg total) by mouth daily.  Marland Kitchen levothyroxine (SYNTHROID) 112 MCG tablet Take 1 tablet (112 mcg total) by mouth daily before breakfast.  . loratadine (CLARITIN) 10 MG tablet Take 10 mg by mouth daily.  Marland Kitchen LORazepam (ATIVAN) 0.5 MG tablet Take 1 tablet (0.5 mg total) by mouth daily as needed for anxiety or sleep.  . Multiple Vitamin (MULTIVITAMIN) tablet Take 1 tablet by mouth daily.  . nitroGLYCERIN (NITROSTAT) 0.4 MG SL tablet Place 1 tablet (0.4 mg total) under the tongue every 5 (five) minutes as needed for chest pain.  Marland Kitchen PATADAY 0.1 % ophthalmic solution   . senna (SENOKOT) 8.6 MG tablet Take 1 tablet by mouth daily.  . sertraline (ZOLOFT) 100 MG tablet Take 1.5 tablets (150 mg total) by mouth daily.  Marland Kitchen spironolactone (ALDACTONE) 25 MG tablet Take 0.5 tablets (12.5 mg total) by mouth daily.   No facility-administered encounter medications on file as of 05/25/2019.     Objective:  BP Readings from Last 3 Encounters:  04/23/19 109/73  04/16/19 108/62  02/28/19 120/75    Goals Addressed            This Visit's Progress   . RNCM: I have not started exercising yet       CARE PLAN ENTRY (see longtitudinal plan of care for additional care plan information)  Current Barriers:  . Chronic Disease Management support, education, and care coordination needs related to CAD,  HTN, HLD, and CVA  Clinical Goal(s) related to CAD, HTN, HLD, and CVA :  Over the next 90 days, patient will:  . Work with the care management team to address educational, disease management, and care coordination needs  . Begin or continue self health monitoring activities as directed today Measure and record blood pressure 4 times per week and start exercise plan per recommendations of neurologist with goal of 15 mins per day . Call provider office for new or worsened signs and symptoms Blood pressure findings outside established parameters, Shortness of breath, and New or  worsened symptom related to HLD and CVA . Call care management team with questions or concerns . Verbalize basic understanding of patient centered plan of care established today  Interventions related to CAD, HTN, HLD, and CVA :  . Evaluation of current treatment plans and patient's adherence to plan as established by provider . Education on writing blood pressure readings down so written log could be taken to the provider office on visits.  The patients wife is writing the patients blood pressures down. His systolic is usually 376-283 and diastolic is 15-17- a high reading on 4-2 was 140/65 with a pulse of 65.  Per the wife the patient was very active that day. [name].  Oxygen sats are 92-96%.   . Assessed patient understanding of disease states.  Good understanding of chronic diseases and how to effectively manage . Assessed patient's education and care coordination needs . Assessed patient's exercise patterns. The patient has started an exercise regimen, he and his wife purchased a [place] and he is doing 2000 steps daily. This takes him approximately 30 minutes to do.  He feels this is also helping his balance.  The neurologist recommended last week the patient start exercising and do 15 minutes per day. Per the wife he does take some days off but he is still continuing to use regularly.  . Provided disease specific education to patient. Education and review. Endorses compliance . Collaborated with appropriate clinical care team members regarding patient needs . Sent message by chart system to Dr. Parks Ranger requesting the patients medication refills be sent to [facility] with 90 day supply- today he needed a refill for Isosorbide.  Dr. Parks Ranger has called and given refill notification to the pharmacy. Completed   . Instructed the patient and the patients wife to call the cardiologist for the loop recorder device report. Completed  . Reviewed upcoming  appointments: pcp appointment on 10-17-2019 at 2 pm . Call to the patient and the patients wife with information on refill for Isosorbide has been sent to the pharmacy by Dr. Parks Ranger- done . Education on call back to the patient that Dr. Parks Ranger has sent a referral to [place] specialist in [place]. Information provided that they will call the patient for appointment information. The wife took down their address and number. She verbalized understanding of them reaching out to the patient for an appointment. The patient had called earlier asking for a referral to see a podiatrist for foot care and nails trimmed. Was told at the [place] that this would be a good thing for him to do.  . Evaluation of visit to the [place].  The patients visit went well. The patient seems to be stable at this time. Will continue to monitor for changes.   CAD, HTN, HLD, and CVA :  . Patient is unable to independently self-manage chronic health conditions  Please see  past updates related to this goal by clicking on the "Past Updates" button in the selected goal          Plan:   The care management team will reach out to the patient again over the next 60 days.    Alto Denver RN, MSN, CCM Community Care Coordinator [place]  [facility] [place] Mobile: [phone]

## 2019-05-30 ENCOUNTER — Encounter: Payer: Self-pay | Admitting: Acute Care

## 2019-05-30 ENCOUNTER — Ambulatory Visit (INDEPENDENT_AMBULATORY_CARE_PROVIDER_SITE_OTHER): Payer: Medicare PPO | Admitting: Acute Care

## 2019-05-30 ENCOUNTER — Other Ambulatory Visit: Payer: Self-pay

## 2019-05-30 VITALS — BP 110/70 | HR 83 | Temp 97.7°F | Ht 68.0 in | Wt 244.8 lb

## 2019-05-30 DIAGNOSIS — G4733 Obstructive sleep apnea (adult) (pediatric): Secondary | ICD-10-CM | POA: Diagnosis not present

## 2019-05-30 NOTE — Progress Notes (Signed)
History of Present Illness Luke Mckee is a 79 y.o. male never smoker with PMHx of CAD ( CABG 2005 and then stents 4-5 years ago) , HTN, CHF, atrial fib, heart murmur,  And  OSA on CPAP since 2005. He is relocating from [place] to [facility]  and is here to establish care.He will be followed by Dr. Belia Heman   05/30/2019 Follow up for Sleep Apnea. Pt has recently relocated to [facility] from [place] . They relocated here 05/12/2018.  He was diagnosed with sleep apnea after CABG in 2005.They noted apnea in the hospital.  He had not had a seen his sleep doctor since 2019. He missed his 2020 appointment due to Covid. He has had 2 strokes in the internum, one in April 2020, an them again July of 2020.He appears to have minimal residuals from his strikes ( TIA's ?) He states he has been using his CPAP every night. He states he could not sleep without his CPAP machine. The patient thinks his machine is set at 17 cm H2O, but he is unsure. We have been unable to down load results from [employer]. . He states he has very little daytime sleepiness, he does not awaken with a morning headache. Per his wife, he does snore at times over his device., but this is not often He goes to bed at about 11:30 or midnight every night  He takes about 1 hour to fall asleep He wakes up 1-2 times a night. He starts his day between 9 am and 10 am He is married, a retired Firefighter, he has children Pt has a So Clean device and cleans his machine daily  Epworth Score is 12  While the patient was well established in [place], he states it has been about 15 years since he has had a sleep test. We will order a HST, as he has had weight gain over this period of time, and we will get a better reading of his needs.  I have asked him to get copies of his previous sleep studies for his medical record for comparison.    Test Results: Current CPAP machine serial Number      CBC Latest Ref Rng & Units 07/24/2018 07/23/2018 07/23/2018  WBC 4.0 - 10.5  K/uL 7.3 7.0 -  Hemoglobin 13.0 - 17.0 g/dL 12.6(L) 13.4 13.9  Hematocrit 39.0 - 52.0 % 37.1(L) 40.3 41.0  Platelets 150 - 400 K/uL 201 210 -    BMP Latest Ref Rng & Units 08/03/2018 07/25/2018 07/24/2018  Glucose 65 - 99 mg/dL 300(T) 622(Q) 333(L)  BUN 7 - 25 mg/dL 17 14 12   Creatinine 0.70 - 1.18 mg/dL 4.56 2.56  BUN/Creat Ratio 6 - 22 (calc) NOT APPLICABLE - -  Sodium 135 - 146 mmol/L 140 140 140  Potassium 3.5 - 5.3 mmol/L 3.9 4.0 3.3(L)  Chloride 98 - 110 mmol/L 102 106 104  CO2 20 - 32 mmol/L 25 26 26   Calcium 8.6 - 10.3 mg/dL 9.1 9.0 3.89)    BNP No results found for: BNP  ProBNP No results found for: PROBNP  PFT No results found for: FEV1PRE, FEV1POST, FVCPRE, FVCPOST, TLC, DLCOUNC, PREFEV1FVCRT, PSTFEV1FVCRT  CUP PACEART REMOTE DEVICE CHECK  Result Date: 05/10/2019 Carelink summary report received. Battery status OK. Normal device function. No new symptom episodes, tachy episodes, brady, or pause episodes. No new AF episodes. Monthly summary reports and ROV/PRN [employer]    Past medical hx Past Medical History:  Diagnosis Date  . Anxiety   .  Arthritis   . CAD (coronary artery disease) 2005   s/p CABG, DES  . CHF (congestive heart failure) (HCC)    in EPIC care everywhere  . Chronic kidney disease   . COPD (chronic obstructive pulmonary disease) (HCC)    in EPIC care everywhere  . Depression   . Heart murmur   . History of stroke   . Hypothyroidism   . OSA (obstructive sleep apnea)   . Paroxysmal A-fib (HCC)    in EPIC careeverywhere     Social History   Tobacco Use  . Smoking status: Never Smoker  . Smokeless tobacco: Never Used  Substance Use Topics  . Alcohol use: Not Currently    Comment: past  . Drug use: Never    [name] reports that he has never smoked. He has never used smokeless tobacco. He reports previous alcohol use. He reports that he does not use drugs.  Tobacco Cessation: Never smoker  Past surgical hx, Family hx, Social hx  all reviewed.  Current Outpatient Medications on File Prior to Visit  Medication Sig  . acetaminophen (TYLENOL 8 HOUR) 650 MG CR tablet Take 1,300 mg by mouth at bedtime.   Marland Kitchen aspirin 81 MG chewable tablet Chew 81 mg by mouth daily.  Marland Kitchen atenolol (TENORMIN) 50 MG tablet Take 1 tablet (50 mg total) by mouth at bedtime.  Marland Kitchen atorvastatin (LIPITOR) 80 MG tablet TAKE 1 TABLET BY MOUTH DAILY  . cetirizine (ZYRTEC) 10 MG tablet Take 10 mg by mouth at bedtime.   . Cholecalciferol (VITAMIN D) 50 MCG (2000 UT) tablet Take 4,000 Units by mouth daily.  . clopidogrel (PLAVIX) 75 MG tablet Take 1 tablet (75 mg total) by mouth daily.  . diclofenac sodium (VOLTAREN) 1 % GEL Apply 2 g topically 4 (four) times daily as needed for pain. Foot pain  . docusate sodium (COLACE) 100 MG capsule Take 200 mg by mouth daily.   . furosemide (LASIX) 80 MG tablet Take 1 tablet (80 mg total) by mouth 2 (two) times daily.  Marland Kitchen ipratropium (ATROVENT) 0.06 % nasal spray USE 2 SPRAYS IN EACH NOSTRIL THREE TIMES DAILY AS NEEDED FOR RHINITIS  . isosorbide mononitrate (IMDUR) 30 MG 24 hr tablet Take 1 tablet (30 mg total) by mouth daily.  Marland Kitchen levothyroxine (SYNTHROID) 112 MCG tablet Take 1 tablet (112 mcg total) by mouth daily before breakfast.  . loratadine (CLARITIN) 10 MG tablet Take 10 mg by mouth daily.  Marland Kitchen LORazepam (ATIVAN) 0.5 MG tablet Take 1 tablet (0.5 mg total) by mouth daily as needed for anxiety or sleep.  . Multiple Vitamin (MULTIVITAMIN) tablet Take 1 tablet by mouth daily.  . nitroGLYCERIN (NITROSTAT) 0.4 MG SL tablet Place 1 tablet (0.4 mg total) under the tongue every 5 (five) minutes as needed for chest pain.  Marland Kitchen PATADAY 0.1 % ophthalmic solution   . senna (SENOKOT) 8.6 MG tablet Take 1 tablet by mouth daily.  . sertraline (ZOLOFT) 100 MG tablet Take 1.5 tablets (150 mg total) by mouth daily.  Marland Kitchen spironolactone (ALDACTONE) 25 MG tablet Take 0.5 tablets (12.5 mg total) by mouth daily.   No current facility-administered  medications on file prior to visit.     Allergies  Allergen Reactions  . Ace Inhibitors   . Cephalexin     Vomiting and diarrhea  . Crestor [Rosuvastatin Calcium] Other (See Comments)    myalgia  . Tape Rash    blistering    Review Of Systems:  Constitutional:   No  weight loss, night sweats,  Fevers, chills, fatigue, or  lassitude.  HEENT:   No headaches,  Difficulty swallowing,  Tooth/dental problems, or  Sore throat,                No sneezing, itching, ear ache, nasal congestion, post nasal drip,   CV:  No chest pain,  Orthopnea, PND, swelling in lower extremities, anasarca, dizziness, palpitations, syncope.   GI  No heartburn, indigestion, abdominal pain, nausea, vomiting, diarrhea, change in bowel habits, loss of appetite, bloody stools.   Resp: No shortness of breath with exertion or at rest.  No excess mucus, no productive cough,  No non-productive cough,  No coughing up of blood.  No change in color of mucus.  No wheezing.  No chest wall deformity  Skin: no rash or lesions.  GU: no dysuria, change in color of urine, no urgency or frequency.  No flank pain, no hematuria   MS:  No joint pain or swelling.  No decreased range of motion.  No back pain.  Psych:  No change in mood or affect. No depression or anxiety.  No memory loss.   Vital Signs BP 110/70 (BP Location: Left Arm, Patient Position: Bed low/side rails up, Cuff Size: Large)   Pulse 83   Temp 97.7 F (36.5 C) (Temporal)   Ht 5\' 8"  (1.727 m)   Wt 244 lb 12.8 oz (111 kg)   SpO2 94%   BMI 37.22 kg/m    Physical Exam:  General- No distress,  A&Ox3, very pleasant ENT: No sinus tenderness, TM clear, pale nasal mucosa, no oral exudate,no post nasal drip, no LAN Cardiac: S1, S2, regular rate and rhythm, no murmur Chest: No wheeze/ rales/ dullness; no accessory muscle use, no nasal flaring, no sternal retractions Abd.: Soft Non-tender, ND, BS +, Body mass index is 37.22 kg/m. Ext: No clubbing cyanosis,  edema Neuro: Physically deconditioned, , MAE x 4, A&O x 3 Skin: No rashes, No lesions, warm and dry Psych: normal mood and behavior   Assessment/Plan  Confirmed OSA , on CPAP since 2005. Has relocated and is establishing with [facility] for management of OSA on CPAP Unable to get down Load from [facility] card, but thinks machine has set pressure of 17 or 18 cm H2O Plan We look forward to working with you. We will order a home sleep study We will enroll you in Air view to allow [facility] to get down Loads from your device..  We will need to register your device with a DME company>> we will call and see who we can get you set up with.  Please do a trial with the other mask you have to see if you feel this has less of a leak. ( Currently using nasal pillow) If this mask change does not work, we will schedule you for a mask fitting.  Continue on CPAP at bedtime. You appear to be benefiting from the treatment  Goal is to wear for at least 6 hours each night for maximal clinical benefit. Continue to work on weight loss, as the link between excess weight  and sleep apnea is well established.   Remember to establish a good bedtime routine, and work on sleep hygiene.  Limit daytime naps , avoid stimulants such as caffeine and nicotine close to bedtime, exercise daily to promote sleep quality, avoid heavy , spicy, fried , or rich foods before bed. Ensure adequate exposure to natural light during the day,establish a relaxing bedtime routine with  a pleasant sleep environment ( Bedroom between 60 and 67 degrees, turn off bright lights , TV or device screens screens , consider black out curtains or white noise machines) Do not drive if sleepy. Remember to clean mask, tubing, filter, and reservoir once weekly with soapy water.  Follow up with 1 month with Judson Roch NP for follow up . If you need [place] prior to this, Please call the office.   Please have previous sleep study results sent to [facility] for  Insertion into  your medical record.   This appointment was 40 min long with over 50% of the time in direct face-to-face patient care, assessment, plan of care, and follow-up.    Magdalen Spatz, NP 05/30/2019  3:19 PM

## 2019-05-30 NOTE — Patient Instructions (Addendum)
It is good to meet you today. We look forward to working with you. We will order a home sleep study We will enroll you in Air view to allow [place] to get down Loads from your device..  We will need to register your device with a DME company>> we will call and see who we can get you set up with.  Please do a trial with the other mask you have to see if you feel this has less of a leak. ( Currently using nasal pillow) If this mask change does not work, we will schedule you for a mask fitting.  Continue on CPAP at bedtime. You appear to be benefiting from the treatment  Goal is to wear for at least 6 hours each night for maximal clinical benefit. Continue to work on weight loss, as the link between excess weight  and sleep apnea is well established.   Remember to establish a good bedtime routine, and work on sleep hygiene.  Limit daytime naps , avoid stimulants such as caffeine and nicotine close to bedtime, exercise daily to promote sleep quality, avoid heavy , spicy, fried , or rich foods before bed. Ensure adequate exposure to natural light during the day,establish a relaxing bedtime routine with a pleasant sleep environment ( Bedroom between 60 and 67 degrees, turn off bright lights , TV or device screens screens , consider black out curtains or white noise machines) Do not drive if sleepy. Remember to clean mask, tubing, filter, and reservoir once weekly with soapy water.  Follow up with 1 month with Maralyn Sago NP for follow up . If you need [place] prior to this, [facility] call the office.   Please have previous sleep study results sent to [facility] for  Insertion into your medical record.

## 2019-05-31 ENCOUNTER — Encounter: Payer: Self-pay | Admitting: Acute Care

## 2019-05-31 ENCOUNTER — Telehealth: Payer: Self-pay | Admitting: Acute Care

## 2019-05-31 NOTE — Telephone Encounter (Signed)
Spoke with patients wife [name] I was provided with information to get patient's medical records. Will fax over medical release form in the morning.

## 2019-06-01 NOTE — Telephone Encounter (Signed)
Information was received medical release was faxed to [facility] at [phone] and [facility] at [phone]. Will wait to get records. Nothing further needed at this time.

## 2019-06-02 ENCOUNTER — Other Ambulatory Visit: Payer: Self-pay | Admitting: Family Medicine

## 2019-06-02 DIAGNOSIS — E782 Mixed hyperlipidemia: Secondary | ICD-10-CM

## 2019-06-02 NOTE — Telephone Encounter (Signed)
Due for labs in July  Requested Prescriptions  Pending Prescriptions Disp Refills  . atorvastatin (LIPITOR) 80 MG tablet [Pharmacy Med Name: ATORVASTATIN 80MG  TABLETS] 90 tablet 0    Sig: TAKE 1 TABLET BY MOUTH DAILY     Cardiovascular:  Antilipid - Statins Failed - 06/02/2019 10:02 AM      Failed - HDL in normal range and within 360 days    HDL  Date Value Ref Range Status  07/24/2018 33 (L) >40 mg/dL Final         Passed - Total Cholesterol in normal range and within 360 days    Cholesterol  Date Value Ref Range Status  07/24/2018 123 0 - 200 mg/dL Final         Passed - LDL in normal range and within 360 days    LDL Cholesterol  Date Value Ref Range Status  07/24/2018 65 0 - 99 mg/dL Final    Comment:           Total Cholesterol/HDL:CHD Risk Coronary Heart Disease Risk Table                     Men   Women  1/2 Average Risk   3.4   3.3  Average Risk       5.0   4.4  2 X Average Risk   9.6   7.1  3 X Average Risk  23.4   11.0        Use the calculated Patient Ratio above and the CHD Risk Table to determine the patient's CHD Risk.        ATP III CLASSIFICATION (LDL):  <100     mg/dL   Optimal  07/26/2018  mg/dL   Near or Above                    Optimal  130-159  mg/dL   Borderline  188-416  mg/dL   High  606-301     mg/dL   Very High Performed at [facility], 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 [facility])   [facility], [facility], DO   10 months ago History of cerebrovascular accident (CVA) with residual deficit   [facility], DO   12 months ago History of cerebrovascular accident (CVA) with residual deficit   [facility], DO      Future Appointments            In 1 month [name], [place], NP [facility]   In 1 month Joellen Jersey, MD [facility]   In 4 months [name], [name], DO [facility]

## 2019-06-12 ENCOUNTER — Ambulatory Visit (INDEPENDENT_AMBULATORY_CARE_PROVIDER_SITE_OTHER): Payer: Medicare PPO | Admitting: *Deleted

## 2019-06-12 DIAGNOSIS — I63412 Cerebral infarction due to embolism of left middle cerebral artery: Secondary | ICD-10-CM | POA: Diagnosis not present

## 2019-06-12 LAB — CUP PACEART REMOTE DEVICE CHECK
Date Time Interrogation Session: 20210529232812
Implantable Pulse Generator Implant Date: 20200714

## 2019-06-13 ENCOUNTER — Ambulatory Visit (INDEPENDENT_AMBULATORY_CARE_PROVIDER_SITE_OTHER): Payer: Medicare PPO | Admitting: Podiatry

## 2019-06-13 ENCOUNTER — Encounter: Payer: Self-pay | Admitting: Podiatry

## 2019-06-13 ENCOUNTER — Other Ambulatory Visit: Payer: Self-pay

## 2019-06-13 DIAGNOSIS — B351 Tinea unguium: Secondary | ICD-10-CM | POA: Diagnosis not present

## 2019-06-13 DIAGNOSIS — M79676 Pain in unspecified toe(s): Secondary | ICD-10-CM

## 2019-06-13 NOTE — Progress Notes (Signed)
Carelink Summary Report / Loop Recorder 

## 2019-06-15 NOTE — Progress Notes (Signed)
Subjective:  Patient ID: Luke Mckee, male    DOB: July 10, 1940,  MRN: [id number] HPI Chief Complaint  Patient presents with   Nail Problem    nail trim and RFC    79 y.o. male presents with the above complaint.   ROS: He denies fever chills nausea vomiting muscle aches pains calf pain back pain chest pain shortness of breath.  Has an appointment with Dr. Gwen Pounds next month for nail fungus and for athlete's foot.  [facility] to cut his toenails.  Past Medical History:  Diagnosis Date   Anxiety    Arthritis    CAD (coronary artery disease) 2005   s/p CABG, DES   CHF (congestive heart failure) (HCC)    in EPIC care everywhere   Chronic kidney disease    COPD (chronic obstructive pulmonary disease) (HCC)    in EPIC care everywhere   Depression    Heart murmur    History of stroke    Hypothyroidism    OSA (obstructive sleep apnea)    Paroxysmal A-fib (HCC)    in EPIC careeverywhere   Past Surgical History:  Procedure Laterality Date   APPENDECTOMY     BUNIONECTOMY     CATARACT EXTRACTION     Right eye   COLONOSCOPY  07/06/2004   CORONARY ARTERY BYPASS GRAFT  2005   LOOP RECORDER INSERTION N/A 07/25/2018   Procedure: LOOP RECORDER INSERTION;  Surgeon: Regan Lemming, MD;  Location: [facility]VAS[facility];  Service: Cardiovascular;  Laterality: N/A;   TOTAL HIP ARTHROPLASTY Right 11/23/2011   TOTAL HIP ARTHROPLASTY Left 09/07/2011   TOTAL KNEE ARTHROPLASTY Right 02/15/2018   VASECTOMY  1978    Current Outpatient Medications:    acetaminophen (TYLENOL 8 HOUR) 650 MG CR tablet, Take 1,300 mg by mouth at bedtime. , Disp: , Rfl:    aspirin 81 MG chewable tablet, Chew 81 mg by mouth daily., Disp: , Rfl:    atenolol (TENORMIN) 50 MG tablet, Take 1 tablet (50 mg total) by mouth at bedtime., Disp: 90 tablet, Rfl: 1   atorvastatin (LIPITOR) 80 MG tablet, TAKE 1 TABLET BY MOUTH DAILY, Disp: 90 tablet, Rfl: 1   cetirizine (ZYRTEC) 10  MG tablet, Take 10 mg by mouth at bedtime. , Disp: , Rfl:    Cholecalciferol (VITAMIN D) 50 MCG (2000 UT) tablet, Take 4,000 Units by mouth daily., Disp: , Rfl:    clopidogrel (PLAVIX) 75 MG tablet, Take 1 tablet (75 mg total) by mouth daily., Disp: 90 tablet, Rfl: 3   diclofenac sodium (VOLTAREN) 1 % GEL, Apply 2 g topically 4 (four) times daily as needed for pain. Foot pain, Disp: , Rfl:    docusate sodium (COLACE) 100 MG capsule, Take 200 mg by mouth daily. , Disp: , Rfl:    furosemide (LASIX) 80 MG tablet, Take 1 tablet (80 mg total) by mouth 2 (two) times daily., Disp: 180 tablet, Rfl: 1   ipratropium (ATROVENT) 0.06 % nasal spray, USE 2 SPRAYS IN EACH NOSTRIL THREE TIMES DAILY AS NEEDED FOR RHINITIS, Disp: 15 mL, Rfl: 2   isosorbide mononitrate (IMDUR) 30 MG 24 hr tablet, Take 1 tablet (30 mg total) by mouth daily., Disp: 90 tablet, Rfl: 1   levothyroxine (SYNTHROID) 112 MCG tablet, Take 1 tablet (112 mcg total) by mouth daily before breakfast., Disp: 90 tablet, Rfl: 1   loratadine (CLARITIN) 10 MG tablet, Take 10 mg by mouth daily., Disp: , Rfl:    LORazepam (ATIVAN) 0.5 MG  tablet, Take 1 tablet (0.5 mg total) by mouth daily as needed for anxiety or sleep., Disp: 30 tablet, Rfl: 0   Multiple Vitamin (MULTIVITAMIN) tablet, Take 1 tablet by mouth daily., Disp: , Rfl:    nitroGLYCERIN (NITROSTAT) 0.4 MG SL tablet, Place 1 tablet (0.4 mg total) under the tongue every 5 (five) minutes as needed for chest pain., Disp: 20 tablet, Rfl: 2   PATADAY 0.1 % ophthalmic solution, , Disp: , Rfl:    senna (SENOKOT) 8.6 MG tablet, Take 1 tablet by mouth daily., Disp: , Rfl:    sertraline (ZOLOFT) 100 MG tablet, Take 1.5 tablets (150 mg total) by mouth daily., Disp: 135 tablet, Rfl: 1   spironolactone (ALDACTONE) 25 MG tablet, Take 0.5 tablets (12.5 mg total) by mouth daily., Disp: 45 tablet, Rfl: 1  Allergies  Allergen Reactions   Ace Inhibitors    Cephalexin     Vomiting and diarrhea    Crestor [Rosuvastatin Calcium] Other (See Comments)    myalgia   Tape Rash    blistering   Review of Systems Objective:  There were no vitals filed for this visit.  General: Well developed, nourished, in no acute distress, alert and oriented x3   Dermatological: Skin is warm, dry and supple bilateral. Nails x 10 are thick yellow dystrophic painful and clinically mycotic, remaining integument appears to demonstrate tinea pedis plantar aspect of the bilateral foot. There are no open sores, no preulcerative lesions, no rash or signs of infection present.  Vascular: Dorsalis Pedis artery and Posterior Tibial artery pedal pulses are 2/4 bilateral with immedate capillary fill time. Pedal hair growth present. No varicosities and no lower extremity edema present bilateral.   Neruologic: Grossly intact via light touch bilateral. Vibratory intact via tuning fork bilateral. Protective threshold with Semmes Wienstein monofilament intact to all pedal sites bilateral. Patellar and Achilles deep tendon reflexes 2+ bilateral. No Babinski or clonus noted bilateral.   Musculoskeletal: No gross boney pedal deformities bilateral. No pain, crepitus, or limitation noted with foot and ankle range of motion bilateral. Muscular strength 5/5 in all groups tested bilateral.  Gait: Unassisted, Nonantalgic.    Radiographs:  None taken  Assessment & Plan:   Assessment: Pain in limb secondary to onychomycosis tinea pedis bilateral.  Plan: Debrided nails 1 through 5 bilaterally today.  According to the [place] they wanted dermatology to treat the nail fungus and athlete's foot.     Zarrah Loveland T. [place], [place]

## 2019-06-19 ENCOUNTER — Other Ambulatory Visit: Payer: Self-pay | Admitting: Family Medicine

## 2019-06-19 DIAGNOSIS — Z1211 Encounter for screening for malignant neoplasm of colon: Secondary | ICD-10-CM

## 2019-06-19 NOTE — Progress Notes (Signed)
Requested cologuard.  Saralyn Pilar, DO [facility] 06/19/2019, 10:27 AM

## 2019-07-04 ENCOUNTER — Ambulatory Visit: Payer: Medicare PPO | Admitting: Acute Care

## 2019-07-05 ENCOUNTER — Other Ambulatory Visit: Payer: Self-pay

## 2019-07-05 ENCOUNTER — Encounter: Payer: Self-pay | Admitting: Dermatology

## 2019-07-05 ENCOUNTER — Ambulatory Visit (INDEPENDENT_AMBULATORY_CARE_PROVIDER_SITE_OTHER): Payer: Medicare PPO | Admitting: Dermatology

## 2019-07-05 DIAGNOSIS — D692 Other nonthrombocytopenic purpura: Secondary | ICD-10-CM | POA: Diagnosis not present

## 2019-07-05 DIAGNOSIS — L821 Other seborrheic keratosis: Secondary | ICD-10-CM | POA: Diagnosis not present

## 2019-07-05 DIAGNOSIS — L820 Inflamed seborrheic keratosis: Secondary | ICD-10-CM | POA: Diagnosis not present

## 2019-07-05 DIAGNOSIS — L719 Rosacea, unspecified: Secondary | ICD-10-CM | POA: Diagnosis not present

## 2019-07-05 DIAGNOSIS — L570 Actinic keratosis: Secondary | ICD-10-CM | POA: Diagnosis not present

## 2019-07-05 DIAGNOSIS — B353 Tinea pedis: Secondary | ICD-10-CM | POA: Diagnosis not present

## 2019-07-05 DIAGNOSIS — L578 Other skin changes due to chronic exposure to nonionizing radiation: Secondary | ICD-10-CM

## 2019-07-05 MED ORDER — METRONIDAZOLE 0.75 % EX CREA: TOPICAL_CREAM | Freq: Two times a day (BID) | CUTANEOUS | 3 refills | Status: AC

## 2019-07-05 NOTE — Progress Notes (Signed)
New Patient Visit  Subjective  Luke Mckee is a 79 y.o. male who presents for the following: New Patient (Initial Visit).  Patient presents today as a new patient, he has a couple of concerns today. 1st has multiple areas on B/L arms and R temple he would like to have evaluated for skin cancer, 2nd he has fungus on toes of B/L Foot, Patient has see podiatry for the feet but would like to see what Dr. Nehemiah Massed had to say, [name] wife believes it was Dr. Milinda Pointer.  The following portions of the chart were reviewed this encounter and updated as appropriate:  Tobacco  Allergies  Meds  Problems  Med Hx  Surg Hx  Fam Hx      Review of Systems:  No other skin or systemic complaints except as noted in HPI or Assessment and Plan.  Objective  Well appearing patient in no apparent distress; mood and affect are within normal limits.  A focused examination was performed including Feet, Face, B/L arms and hands . Relevant physical exam findings are noted in the Assessment and Plan.  Objective  Arms and Hands x 5 Face x 2 (7): Erythematous thin papules/macules with gritty scale.   Objective  Right Malar Cheek: telangiectasias and  papules at face  Objective  Right lateral prox bicep, right lateral thigh (2): Erythematous keratotic or waxy stuck-on papule or plaque.   Objective  Right Foot - Anterior: Scaling and maceration web spaces and over distal and lateral soles.    Assessment & Plan    AK (actinic keratosis) (7) Arms and Hands x 5 Face x 2  Cryotherapy today Prior to procedure, discussed risks of blister formation, small wound, skin dyspigmentation, or rare scar following cryotherapy.    Destruction of lesion - Arms and Hands x 5 Face x 2 Complexity: simple   Destruction method: cryotherapy   Informed consent: discussed and consent obtained   Timeout:  patient name, date of birth, surgical site, and procedure verified Lesion destroyed using liquid nitrogen: Yes     Region frozen until ice ball extended beyond lesion: Yes   Outcome: patient tolerated procedure well with no complications   Post-procedure details: wound care instructions given    Rosacea Right Malar Cheek  Start Metro cream, twice daily   metroNIDAZOLE (METROCREAM) 0.75 % cream - Right Malar Cheek  Inflamed seborrheic keratosis (2) Right lateral prox bicep, right lateral thigh  Cryotherapy today Prior to procedure, discussed risks of blister formation, small wound, skin dyspigmentation, or rare scar following cryotherapy.   Destruction of lesion - Right lateral prox bicep, right lateral thigh Complexity: simple   Destruction method: cryotherapy   Informed consent: discussed and consent obtained   Timeout:  patient name, date of birth, surgical site, and procedure verified Lesion destroyed using liquid nitrogen: Yes   Region frozen until ice ball extended beyond lesion: Yes   Outcome: patient tolerated procedure well with no complications   Post-procedure details: wound care instructions given    Tinea pedis of right foot - KOH + proven Right Foot - Anterior  With Tinea Unguium KOH scraping from feet positive for fungal elements discussed oral treatment with Lamisil. If he wants to pursue oral treatment, he would have to stop taking Atorvastatin and Dr Parks Ranger will have to authorize him discontinuing Atorvastin temporarily..  Send note to Dr. Rosamaria Lints and Dr. Milinda Pointer   Seborrheic Keratoses - Stuck-on, waxy, tan-brown papules and plaques  - Discussed benign etiology and prognosis. - Observe -  Call for any changes  Actinic Damage - diffuse scaly erythematous macules with underlying dyspigmentation - Recommend daily broad spectrum sunscreen SPF 30+ to sun-exposed areas, reapply every 2 hours as needed.  - Call for new or changing lesions.  Purpura - Violaceous macules and patches - Benign - Related to age, sun damage and/or use of blood thinners - Observe - Can  use OTC arnica containing moisturizer such as Dermend Bruise Formula if desired - Call for worsening or other concerns   Return in about 3 months (around 10/05/2019) for AK Follow up.  Allen Norris, CMA, am acting as scribe for Armida Sans, MD . Documentation: I have reviewed the above documentation for accuracy and completeness, and I agree with the above.  Armida Sans, MD

## 2019-07-05 NOTE — Patient Instructions (Addendum)
Recommend daily broad spectrum sunscreen SPF 30+ to sun-exposed areas, reapply every 2 hours as needed. Call for new or changing lesions.  Prior to procedure, discussed risks of blister formation, small wound, skin dyspigmentation, or rare scar following cryotherapy.   Cryotherapy Aftercare  . Wash gently with soap and water everyday.   . Apply Vaseline and Band-Aid daily until healed.   Seborrheic Keratosis  What causes seborrheic keratoses? Seborrheic keratoses are harmless, common skin growths that first appear during adult life.  As time goes by, more growths appear.  Some people may develop a large number of them.  Seborrheic keratoses appear on both covered and uncovered body parts.  They are not caused by sunlight.  The tendency to develop seborrheic keratoses can be inherited.  They vary in color from skin-colored to gray, brown, or even black.  They can be either smooth or have a rough, warty surface.   Seborrheic keratoses are superficial and look as if they were stuck on the skin.  Under the microscope this type of keratosis looks like layers upon layers of skin.  That is why at times the top layer may seem to fall off, but the rest of the growth remains and re-grows.    Treatment Seborrheic keratoses do not need to be treated, but can easily be removed in the office.  Seborrheic keratoses often cause symptoms when they rub on clothing or jewelry.  Lesions can be in the way of shaving.  If they become inflamed, they can cause itching, soreness, or burning.  Removal of a seborrheic keratosis can be accomplished by freezing, burning, or surgery. If any spot bleeds, scabs, or grows rapidly, please return to have it checked, as these can be an indication of a skin cancer.  

## 2019-07-09 ENCOUNTER — Ambulatory Visit (INDEPENDENT_AMBULATORY_CARE_PROVIDER_SITE_OTHER): Payer: Medicare PPO | Admitting: General Practice

## 2019-07-09 ENCOUNTER — Telehealth: Payer: Self-pay | Admitting: General Practice

## 2019-07-09 DIAGNOSIS — I10 Essential (primary) hypertension: Secondary | ICD-10-CM | POA: Diagnosis not present

## 2019-07-09 DIAGNOSIS — I6930 Unspecified sequelae of cerebral infarction: Secondary | ICD-10-CM

## 2019-07-09 DIAGNOSIS — E782 Mixed hyperlipidemia: Secondary | ICD-10-CM

## 2019-07-09 DIAGNOSIS — I25810 Atherosclerosis of coronary artery bypass graft(s) without angina pectoris: Secondary | ICD-10-CM

## 2019-07-09 DIAGNOSIS — G4733 Obstructive sleep apnea (adult) (pediatric): Secondary | ICD-10-CM

## 2019-07-09 DIAGNOSIS — L989 Disorder of the skin and subcutaneous tissue, unspecified: Secondary | ICD-10-CM

## 2019-07-09 DIAGNOSIS — L602 Onychogryphosis: Secondary | ICD-10-CM

## 2019-07-09 NOTE — Patient Instructions (Signed)
Visit Information  Goals Addressed              This Visit's Progress   .  RNCM- Pt's wife: "They want Luke Mckee to do another sleep apnea test" (pt-stated)        CARE PLAN ENTRY (see longitudinal plan of care for additional care plan information)  Current Barriers:  Luke Mckee Kitchen Knowledge Deficits related to the need for a new sleep apnea study in the home setting and concerns with the patient not being able to use his cpap machine while doing the study.  . Care Coordination needs related to sleep apnea in a patient with obstructive sleep apnea (disease states) . Chronic Disease Management support and education needs related to obstructive sleep apnea . Spouse concern over in home study for sleep apnea and request the patient have the study done in the clinical setting  Nurse Case Manager Clinical Goal(s):  Luke Mckee Kitchen Over the next 120 days, patient will verbalize understanding of plan for obtaining needed information for adequate therapy and treatment related to obstructive sleep apnea.  . Over the next 120 days, patient will work with [facility] and pcp to address needs related to the need for a new sleep apnea test.  . Over the next 120 days, patient will attend all scheduled medical appointments: next appointment with pcp 10-17-2019 but may need an earlier appointment  Interventions:  . Inter-disciplinary care team collaboration (see longitudinal plan of care) . Evaluation of current treatment plan related to sleep apnea and patient's adherence to plan as established by provider. . Advised patient to continue current treatment regimen at this time . Provided education to patient re: working with the pcp to decide the best course of action to take in meeting needs related to obstructive sleep apnea . Collaborated with pcp regarding the spouses concern over having the sleep study at home without use of his current cpap machine versus having study done in the clinical setting with health professionals available   . Discussed plans with patient for ongoing care management follow up and provided patient with direct contact information for care management team  Patient Self Care Activities:  . Patient verbalizes understanding of plan to work with pcp and CCM team to meet needs related to obstructive sleep apnea . Attends all scheduled provider appointments . Calls provider office for new concerns or questions . Unable to independently do sleep study without the use of cpap due to obstructive sleep apnea  Initial goal documentation     .  RNCM: I have not started exercising yet        CARE PLAN ENTRY (see longtitudinal plan of care for additional care plan information)  Current Barriers:  . Chronic Disease Management support, education, and care coordination needs related to CAD, HTN, HLD, and CVA  Clinical Goal(s) related to CAD, HTN, HLD, and CVA :  Over the next 90 days, patient will:  . Work with the care management team to address educational, disease management, and care coordination needs  . Begin or continue self health monitoring activities as directed today Measure and record blood pressure 4 times per week and start exercise plan per recommendations of neurologist with goal of 15 mins per day . Call provider office for new or worsened signs and symptoms Blood pressure findings outside established parameters, Shortness of breath, and New or worsened symptom related to HLD and CVA . Call care management team with questions or concerns . Verbalize basic understanding of patient centered plan of care  established today  Interventions related to CAD, HTN, HLD, and CVA :  . Evaluation of current treatment plans and patient's adherence to plan as established by provider . Education on writing blood pressure readings down so written log could be taken to the provider office on visits.  The patients wife is writing the patients blood pressures down. His systolic is usually 106-111 and diastolic is  53-73- a high reading on 4-2 was 140/65 with a pulse of 65.  Per the wife the patient was very active that day. [name].  Oxygen sats are 92-96%.  The patient remains stable with blood pressures and heart rate. Oxygen sats are stable as well.  . Assessed patient understanding of disease states.  Good understanding of chronic diseases and how to effectively manage . Assessed patient's education and care coordination needs . Assessed patient's exercise patterns. The patient has started an exercise regimen, he and his wife purchased a [place] and he is doing 2000 steps daily. This takes him approximately 30 minutes to do.  He feels this is also helping his balance.  The neurologist recommended last week the patient start exercising and do 15 minutes per day. Per the wife he does take some days off but he is still continuing to use regularly. The patient continues to use the [place] and is doing well.  . Provided disease specific education to patient. Education and review. Endorses compliance . Collaborated with appropriate clinical care team members regarding patient needs . Reviewed upcoming appointments: pcp appointment on 10-17-2019 at 2 pm . Education on call back to the patient that Dr. Althea Mckee has sent a referral to [facility] in [place]. Information provided that they will call the patient for appointment information. The wife took down their address and number. She verbalized understanding of them reaching out to the patient for an appointment. The patient had called earlier asking for a referral to see a podiatrist for foot care and nails trimmed. Was told at the [place] that this would be a good thing for him to do. 07-09-2019: The patient has been evaluated at the foot center and has had his toenails trimmed. The podiatrist told the patient and the patients wife that he needed Lamisil for fungus present on nails and that he needs to take it in pill form  not topical application.  The problem is that Lamisil reacts against Atorvastatin and the patient would have to go off of the Atorvastatin for 3 months. Will send a message by in basket asking Dr. Althea Mckee for recommendations and possibly if the patient needs to come in to be seen before 10-17-2019 appointment. Will also involve CCM pharmacist for support. The patient is concerned about the fungus but education provided about the risk versus the benefits in light of the patients cardiovascular risk and past history of stroke.  Will collaborate with pcp.  . Evaluation of visit to the dermatologist. The patient had 8 pre-cancerous places removed from his arms, hands, temple and trunk. They have blistered over and the patient is putting Vaseline and Band-Aids on the area as instructed by the provider.  . Evaluation of visit to the [place].  The patients visit went well. The patient seems to be stable at this time. Will continue to monitor for changes.   CAD, HTN, HLD, and CVA :  . Patient is unable to independently self-manage chronic health conditions  Please see past updates related to this goal by clicking  on the "Past Updates" button in the selected goal         Patient verbalizes understanding of instructions provided today.   The care management team will reach out to the patient again over the next 30 to 60 days.   Noreene Larsson RN, MSN, [place] Mobile: [phone]

## 2019-07-09 NOTE — Chronic Care Management (AMB) (Signed)
Chronic Care Management   Follow Up Note   07/09/2019 Name: Luke Mckee MRN: [id number] DOB: 1940/10/26  Referred by: [name], DO Reason for referral : Chronic Care Management (Follow up: RNCM-Chronic Disease Manangement and Care Coordination Needs )   Luke Mckee is a 79 y.o. year old male who is a primary care patient of [name], DO. The CCM team was consulted for assistance with chronic disease management and care coordination needs.    Review of patient status, including review of consultants reports, relevant laboratory and other test results, and collaboration with appropriate care team members and the patient's provider was performed as part of comprehensive patient evaluation and provision of chronic care management services.    SDOH (Social Determinants of Health) assessments performed: Yes See Care Plan activities for detailed interventions related to [facility])     Outpatient Encounter Medications as of 07/09/2019  Medication Sig   acetaminophen (TYLENOL 8 HOUR) 650 MG CR tablet Take 1,300 mg by mouth at bedtime.    aspirin 81 MG chewable tablet Chew 81 mg by mouth daily.   atenolol (TENORMIN) 50 MG tablet Take 1 tablet (50 mg total) by mouth at bedtime.   atorvastatin (LIPITOR) 80 MG tablet TAKE 1 TABLET BY MOUTH DAILY   cetirizine (ZYRTEC) 10 MG tablet Take 10 mg by mouth at bedtime.    Cholecalciferol (VITAMIN D) 50 MCG (2000 UT) tablet Take 4,000 Units by mouth daily.   clopidogrel (PLAVIX) 75 MG tablet Take 1 tablet (75 mg total) by mouth daily.   diclofenac sodium (VOLTAREN) 1 % GEL Apply 2 g topically 4 (four) times daily as needed for pain. Foot pain   docusate sodium (COLACE) 100 MG capsule Take 200 mg by mouth daily.    furosemide (LASIX) 80 MG tablet Take 1 tablet (80 mg total) by mouth 2 (two) times daily.   ipratropium (ATROVENT) 0.06 % nasal spray USE 2 SPRAYS IN EACH NOSTRIL THREE TIMES DAILY AS NEEDED FOR RHINITIS    isosorbide mononitrate (IMDUR) 30 MG 24 hr tablet Take 1 tablet (30 mg total) by mouth daily.   levothyroxine (SYNTHROID) 112 MCG tablet Take 1 tablet (112 mcg total) by mouth daily before breakfast.   loratadine (CLARITIN) 10 MG tablet Take 10 mg by mouth daily.   LORazepam (ATIVAN) 0.5 MG tablet Take 1 tablet (0.5 mg total) by mouth daily as needed for anxiety or sleep.   metroNIDAZOLE (METROCREAM) 0.75 % cream Apply topically in the morning and at bedtime.   Multiple Vitamin (MULTIVITAMIN) tablet Take 1 tablet by mouth daily.   nitroGLYCERIN (NITROSTAT) 0.4 MG SL tablet Place 1 tablet (0.4 mg total) under the tongue every 5 (five) minutes as needed for chest pain.   PATADAY 0.1 % ophthalmic solution    senna (SENOKOT) 8.6 MG tablet Take 1 tablet by mouth daily.   sertraline (ZOLOFT) 100 MG tablet Take 1.5 tablets (150 mg total) by mouth daily.   spironolactone (ALDACTONE) 25 MG tablet Take 0.5 tablets (12.5 mg total) by mouth daily.   No facility-administered encounter medications on file as of 07/09/2019.     Objective:  BP Readings from Last 3 Encounters:  05/30/19 110/70  04/23/19 109/73  04/16/19 108/62    Goals Addressed              This Visit's Progress     RNCM- Pt's wife: "They want Randy to do another sleep apnea test" (pt-stated)        CARE PLAN  ENTRY (see longitudinal plan of care for additional care plan information)  Current Barriers:   Knowledge Deficits related to the need for a new sleep apnea study in the home setting and concerns with the patient not being able to use his cpap machine while doing the study.   Care Coordination needs related to sleep apnea in a patient with obstructive sleep apnea (disease states)  Chronic Disease Management support and education needs related to obstructive sleep apnea  Spouse concern over in home study for sleep apnea and request the patient have the study done in the clinical setting  Nurse Case Manager  Clinical Goal(s):   Over the next 120 days, patient will verbalize understanding of plan for obtaining needed information for adequate therapy and treatment related to obstructive sleep apnea.   Over the next 120 days, [facility] address needs related to the need for a new sleep apnea test.   Over the next 120 days, patient will attend all scheduled medical appointments: next appointment with pcp 10-17-2019 but may need an earlier appointment  Interventions:   Inter-disciplinary care team collaboration (see longitudinal plan of care)  Evaluation of current treatment plan related to sleep apnea and patient's adherence to plan as established by provider.  Advised patient to continue current treatment regimen at this time  Provided education to patient re: working with the pcp to decide the best course of action to take in meeting needs related to obstructive sleep apnea  Collaborated with pcp regarding the spouses concern over having the sleep study at home without use of his current cpap machine versus having study done in the clinical setting with health professionals available   Discussed plans with patient for ongoing care management follow up and provided patient with direct contact information for care management team  Patient Self Care Activities:   Patient verbalizes understanding of plan to work with pcp and CCM team to meet needs related to obstructive sleep apnea  Attends all scheduled provider appointments  Calls provider office for new concerns or questions  Unable to independently do sleep study without the use of cpap due to obstructive sleep apnea  Initial goal documentation       RNCM: I have not started exercising yet        CARE PLAN ENTRY (see longtitudinal plan of care for additional care plan information)  Current Barriers:   Chronic Disease Management support, education, and care coordination needs related to CAD, HTN, HLD, and  CVA  Clinical Goal(s) related to CAD, HTN, HLD, and CVA :  Over the next 90 days, patient will:   Work with the care management team to address educational, disease management, and care coordination needs   Begin or continue self health monitoring activities as directed today Measure and record blood pressure 4 times per week and start exercise plan per recommendations of neurologist with goal of 15 mins per day  Call provider office for new or worsened signs and symptoms Blood pressure findings outside established parameters, Shortness of breath, and New or worsened symptom related to HLD and CVA  Call care management team with questions or concerns  Verbalize basic understanding of patient centered plan of care established today  Interventions related to CAD, HTN, HLD, and CVA :   Evaluation of current treatment plans and patient's adherence to plan as established by provider  Education on writing blood pressure readings down so written log could be taken to the provider office  on visits.  The patients wife is writing the patients blood pressures down. [name].  Per the wife the patient was very active that day. [name].  Oxygen sats are 92-96%.  The patient remains stable with blood pressures and heart rate. Oxygen sats are stable as well.   Assessed patient understanding of disease states.  Good understanding of chronic diseases and how to effectively manage  Assessed patient's education and care coordination needs  Assessed patient's exercise patterns. The patient has started an exercise regimen, he and his wife purchased a [place] and he is doing 2000 steps daily. This takes him approximately 30 minutes to do.  He feels this is also helping his balance.  The neurologist recommended last week the patient start exercising and do 15 minutes per day. Per  the wife he does take some days off but he is still continuing to use regularly. The patient continues to use the [place] and is doing well.   Provided disease specific education to patient. Education and review. Endorses compliance  Collaborated with appropriate clinical care team members regarding patient needs  Reviewed upcoming appointments: pcp appointment on 10-17-2019 at 2 pm  Education on call back to the patient that Dr. [name] has sent a referral to [facility] in [place]. Information provided that they will call the patient for appointment information. The wife took down their address and number. She verbalized understanding of them reaching out to the patient for an appointment. The patient had called earlier asking for a referral to see a podiatrist for foot care and nails trimmed. Was told at the [place] that this would be a good thing for him to do. 07-09-2019: The patient has been evaluated at the foot center and has had his toenails trimmed. The podiatrist told the patient and the patients wife that he needed Lamisil for fungus present on nails and that he needs to take it in pill form not topical application.  The problem is that Lamisil reacts against Atorvastatin and the patient would have to go off of the Atorvastatin for 3 months. Will send a message by in basket asking Dr. [name] for recommendations and possibly if the patient needs to come in to be seen before 10-17-2019 appointment. Will also involve CCM pharmacist for support. The patient is concerned about the fungus but education provided about the risk versus the benefits in light of the patients cardiovascular risk and past history of stroke.  Will collaborate with pcp.   Evaluation of visit to the dermatologist. The patient had 8 pre-cancerous places removed from his arms, hands, temple and trunk. They have blistered over and the patient is putting Vaseline and Band-Aids on the area as instructed by the  provider.   Evaluation of visit to the [place].  The patients visit went well. The patient seems to be stable at this time. Will continue to monitor for changes.   CAD, HTN, HLD, and CVA :   Patient is unable to independently self-manage chronic health conditions  Please see past updates related to this goal by clicking on the "Past Updates" button in the selected goal          Plan:   The care management team will reach out to the patient again over the next 30 to 60 days.    Alto Denver RN, MSN,  CCM Community Care Coordinator [place]   [facility] [place] Mobile: [phone]

## 2019-07-12 ENCOUNTER — Ambulatory Visit: Payer: Self-pay | Admitting: General Practice

## 2019-07-12 ENCOUNTER — Encounter: Payer: Self-pay | Admitting: Dermatology

## 2019-07-12 NOTE — Chronic Care Management (AMB) (Signed)
  Chronic Care Management   Outreach Note  07/12/2019 Name: Luke Mckee MRN: [id number] DOB: September 29, 1940  Referred by: [name], DO Reason for referral : Chronic Care Management (call back with recommendations from the MD: Chronic Disease Management and Care Coordination Needs)   An unsuccessful telephone outreach was attempted today. The patient was referred to the case management team for assistance with care management and care coordination. Follow up call with questions the patient had during outreach this week. Will continue to reach out.   Follow Up Plan: A HIPPA compliant phone message was left for the patient providing contact information and requesting a return call.   Alto Denver RN, MSN, CCM Community Care Coordinator   [facility] [place] Mobile: [phone]

## 2019-07-13 ENCOUNTER — Ambulatory Visit: Payer: Self-pay | Admitting: Pharmacist

## 2019-07-13 ENCOUNTER — Ambulatory Visit (INDEPENDENT_AMBULATORY_CARE_PROVIDER_SITE_OTHER): Payer: Medicare PPO | Admitting: *Deleted

## 2019-07-13 DIAGNOSIS — I63412 Cerebral infarction due to embolism of left middle cerebral artery: Secondary | ICD-10-CM

## 2019-07-13 DIAGNOSIS — E782 Mixed hyperlipidemia: Secondary | ICD-10-CM

## 2019-07-13 LAB — CUP PACEART REMOTE DEVICE CHECK
Date Time Interrogation Session: 20210701231600
Implantable Pulse Generator Implant Date: 20200714

## 2019-07-13 NOTE — Chronic Care Management (AMB) (Addendum)
Chronic Care Management   Follow Up Note   07/13/2019 Name: Luke Mckee MRN: [id number] DOB: Nov 29, 1940  Referred by: [name], DO Reason for referral : Care Coordination (Dermatology)   Luke Mckee is a 79 y.o. year old male who is a primary care patient of [name], DO. The CCM team was consulted for assistance with chronic disease management and care coordination needs.    Coordination of care call to [facility]. Leave message.  Review of patient status, including review of consultants reports, relevant laboratory and other test results, and collaboration with appropriate care team members and the patient's provider was performed as part of comprehensive patient evaluation and provision of chronic care management services.     Outpatient Encounter Medications as of 07/13/2019  Medication Sig   acetaminophen (TYLENOL 8 HOUR) 650 MG CR tablet Take 1,300 mg by mouth at bedtime.    aspirin 81 MG chewable tablet Chew 81 mg by mouth daily.   atenolol (TENORMIN) 50 MG tablet Take 1 tablet (50 mg total) by mouth at bedtime.   atorvastatin (LIPITOR) 80 MG tablet TAKE 1 TABLET BY MOUTH DAILY   cetirizine (ZYRTEC) 10 MG tablet Take 10 mg by mouth at bedtime.    Cholecalciferol (VITAMIN D) 50 MCG (2000 UT) tablet Take 4,000 Units by mouth daily.   clopidogrel (PLAVIX) 75 MG tablet Take 1 tablet (75 mg total) by mouth daily.   diclofenac sodium (VOLTAREN) 1 % GEL Apply 2 g topically 4 (four) times daily as needed for pain. Foot pain   docusate sodium (COLACE) 100 MG capsule Take 200 mg by mouth daily.    furosemide (LASIX) 80 MG tablet Take 1 tablet (80 mg total) by mouth 2 (two) times daily.   ipratropium (ATROVENT) 0.06 % nasal spray USE 2 SPRAYS IN EACH NOSTRIL THREE TIMES DAILY AS NEEDED FOR RHINITIS   isosorbide mononitrate (IMDUR) 30 MG 24 hr tablet Take 1 tablet (30 mg total) by mouth daily.   levothyroxine (SYNTHROID) 112 MCG tablet  Take 1 tablet (112 mcg total) by mouth daily before breakfast.   loratadine (CLARITIN) 10 MG tablet Take 10 mg by mouth daily.   LORazepam (ATIVAN) 0.5 MG tablet Take 1 tablet (0.5 mg total) by mouth daily as needed for anxiety or sleep.   metroNIDAZOLE (METROCREAM) 0.75 % cream Apply topically in the morning and at bedtime.   Multiple Vitamin (MULTIVITAMIN) tablet Take 1 tablet by mouth daily.   nitroGLYCERIN (NITROSTAT) 0.4 MG SL tablet Place 1 tablet (0.4 mg total) under the tongue every 5 (five) minutes as needed for chest pain.   PATADAY 0.1 % ophthalmic solution    senna (SENOKOT) 8.6 MG tablet Take 1 tablet by mouth daily.   sertraline (ZOLOFT) 100 MG tablet Take 1.5 tablets (150 mg total) by mouth daily.   spironolactone (ALDACTONE) 25 MG tablet Take 0.5 tablets (12.5 mg total) by mouth daily.   No facility-administered encounter medications on file as of 07/13/2019.    Goals Addressed            This Visit's Progress    PharmD - Medication Management       Current Barriers:   Chronic Disease Management support, education, and care coordination needs related to CAD, HTN, HLD, and CVA  Pharmacist Clinical Goal(s):   Over the next 30 days, patient will work with CM Pharmacist to address medication management needs  Interventions:  Receive message from [facility] Nurse Case Manager & PCP requesting information  re: potential drug-drug interaction -patient advised by Dermatologist that if started on course of oral Lamisil, would need to temporarily discontinue atorvastatin  Review potential for interaction between terbinafine (Lamisil) and atorvastatin. No interaction identified. o Note some azole antifungals, such as itraconazole, are strong inhibitors of the CYP3A4 liver enzyme and temporary discontinuation of the statin agent used to avoid the interaction - From review of available literature, note use of a non-CYP3A4-inhibiting antifungal drug such as terbinafine is  recommended as an alternative to discontinuing statin treatment in the case of such an interaction.  Collaborate with PCP and CCM Nurse Case Manager o Do not recommend holding patient's atorvastatin with the start of terbinafine.  o Recommend checking liver function before starting course of terbinafine and counseling patient on monitoring during therapy.   Place coordination of care call to [facility]. Leave message requesting a call back.  References: Dybro AM, Damkier P, Rasmussen TB, Hellfritzsch M. Statin-associated rhabdomyolysis triggered by drug-drug interaction with itraconazole. BMJ Case Rep. 2016 Sep 7;2016:[id number]. doi: 10.1136/bcr-2016-216457. PMID: 93235573; PMCID: [id number].  [employer] TERBINAFINE HYDROCHLORIDE tablet. 2009 Irene Pap. 2020 Nov; cited 2021 July] In: DailyMed [Internet]. Lafe Garin (MD): [employer] ([place]). Available from: https://[email or web address].com.cy.  [employer]. LIPITOR- atorvastatin calcium tablet, film coated. 1996 Irene Pap 2020 Dec; cited 2021 July] In: DailyMed [Internet]. Lafe Garin (MD): [employer] ([place]). Available from: https://dailymed.https://harris.info/  Patient Self Care Activities:   Attends all scheduled provider appointments  Calls pharmacy for medication refills  Calls provider office for new concerns or questions  Please see past updates related to this goal by clicking on the "Past Updates" button in the selected goal         Plan  The care management team will reach out to the patient again over the next 30 days.   Duanne Moron, PharmD, [facility] Clinical Pharmacist [facility] [employer] [phone]

## 2019-07-17 ENCOUNTER — Ambulatory Visit (INDEPENDENT_AMBULATORY_CARE_PROVIDER_SITE_OTHER): Payer: Medicare PPO

## 2019-07-17 VITALS — BP 132/72 | HR 72 | Temp 98.0°F | Ht 68.0 in | Wt 246.0 lb

## 2019-07-17 DIAGNOSIS — Z0000 Encounter for general adult medical examination without abnormal findings: Secondary | ICD-10-CM | POA: Diagnosis not present

## 2019-07-17 NOTE — Progress Notes (Signed)
Carelink Summary Report / Loop Recorder 

## 2019-07-17 NOTE — Patient Instructions (Signed)
Luke Mckee , Thank you for taking time to come for your Medicare Wellness Visit. I appreciate your ongoing commitment to your health goals. Please review the following plan we discussed and let me know if I can assist you in the future.   Screening recommendations/referrals: Colonoscopy: sending off cologuard Recommended yearly ophthalmology/optometry visit for glaucoma screening and checkup Recommended yearly dental visit for hygiene and checkup  Vaccinations:/ Influenza vaccine: completed 09/05/2018, due 08/12/2019 Pneumococcal vaccine: completed 10/28/20216 Tdap vaccine: due Shingles vaccine: discussed   Covid-19: 03/24/2019, 02/23/2019  Advanced directives: Advance directive discussed with you today.   Conditions/risks identified: none  Next appointment: Follow up in one year for your annual wellness visit.   Preventive Care 8 Years and Older, Male Preventive care refers to lifestyle choices and visits with your health care provider that can promote health and wellness. What does preventive care include?  A yearly physical exam. This is also called an annual well check.  Dental exams once or twice a year.  Routine eye exams. Ask your health care provider how often you should have your eyes checked.  Personal lifestyle choices, including:  Daily care of your teeth and gums.  Regular physical activity.  Eating a healthy diet.  Avoiding tobacco and drug use.  Limiting alcohol use.  Practicing safe sex.  Taking low doses of aspirin every day.  Taking vitamin and mineral supplements as recommended by your health care provider. What happens during an annual well check? The services and screenings done by your health care provider during your annual well check will depend on your age, overall health, lifestyle risk factors, and family history of disease. Counseling  Your health care provider may ask you questions about your:  Alcohol use.  Tobacco use.  Drug  use.  Emotional well-being.  Home and relationship well-being.  Sexual activity.  Eating habits.  History of falls.  Memory and ability to understand (cognition).  Work and work Astronomer. Screening  You may have the following tests or measurements:  Height, weight, and BMI.  Blood pressure.  Lipid and cholesterol levels. These may be checked every 5 years, or more frequently if you are over 58 years old.  Skin check.  Lung cancer screening. You may have this screening every year starting at age 36 if you have a 30-pack-year history of smoking and currently smoke or have quit within the past 15 years.  Fecal occult blood test (FOBT) of the stool. You may have this test every year starting at age 62.  Flexible sigmoidoscopy or colonoscopy. You may have a sigmoidoscopy every 5 years or a colonoscopy every 10 years starting at age 73.  Prostate cancer screening. Recommendations will vary depending on your family history and other risks.  Hepatitis C blood test.  Hepatitis B blood test.  Sexually transmitted disease (STD) testing.  Diabetes screening. This is done by checking your blood sugar (glucose) after you have not eaten for a while (fasting). You may have this done every 1-3 years.  Abdominal aortic aneurysm (AAA) screening. You may need this if you are a current or former smoker.  Osteoporosis. You may be screened starting at age 49 if you are at high risk. Talk with your health care provider about your test results, treatment options, and if necessary, the need for more tests. Vaccines  Your health care provider may recommend certain vaccines, such as:  Influenza vaccine. This is recommended every year.  Tetanus, diphtheria, and acellular pertussis (Tdap, Td) vaccine. You may  need a Td booster every 10 years.  Zoster vaccine. You may need this after age 23.  Pneumococcal 13-valent conjugate (PCV13) vaccine. One dose is recommended after age  10.  Pneumococcal polysaccharide (PPSV23) vaccine. One dose is recommended after age 44. Talk to your health care provider about which screenings and vaccines you need and how often you need them. This information is not intended to replace advice given to you by your health care provider. Make sure you discuss any questions you have with your health care provider. Document Released: 01/24/2015 Document Revised: 09/17/2015 Document Reviewed: 10/29/2014 Elsevier Interactive Patient Education  2017 [place] Prevention in the Home Falls can cause injuries. They can happen to people of all ages. There are many things you can do to make your home safe and to help prevent falls. What can I do on the outside of my home?  Regularly fix the edges of walkways and driveways and fix any cracks.  Remove anything that might make you trip as you walk through a door, such as a raised step or threshold.  Trim any bushes or trees on the path to your home.  Use bright outdoor lighting.  Clear any walking paths of anything that might make someone trip, such as rocks or tools.  Regularly check to see if handrails are loose or broken. Make sure that both sides of any steps have handrails.  Any raised decks and porches should have guardrails on the edges.  Have any leaves, snow, or ice cleared regularly.  Use sand or salt on walking paths during winter.  Clean up any spills in your garage right away. This includes oil or grease spills. What can I do in the bathroom?  Use night lights.  Install grab bars by the toilet and in the tub and shower. Do not use towel bars as grab bars.  Use non-skid mats or decals in the tub or shower.  If you need to sit down in the shower, use a plastic, non-slip stool.  Keep the floor dry. Clean up any water that spills on the floor as soon as it happens.  Remove soap buildup in the tub or shower regularly.  Attach bath mats securely with double-sided  non-slip rug tape.  Do not have throw rugs and other things on the floor that can make you trip. What can I do in the bedroom?  Use night lights.  Make sure that you have a light by your bed that is easy to reach.  Do not use any sheets or blankets that are too big for your bed. They should not hang down onto the floor.  Have a firm chair that has side arms. You can use this for support while you get dressed.  Do not have throw rugs and other things on the floor that can make you trip. What can I do in the kitchen?  Clean up any spills right away.  Avoid walking on wet floors.  Keep items that you use a lot in easy-to-reach places.  If you need to reach something above you, use a strong step stool that has a grab bar.  Keep electrical cords out of the way.  Do not use floor polish or wax that makes floors slippery. If you must use wax, use non-skid floor wax.  Do not have throw rugs and other things on the floor that can make you trip. What can I do with my stairs?  Do not leave any items on  the stairs.  Make sure that there are handrails on both sides of the stairs and use them. Fix handrails that are broken or loose. Make sure that handrails are as long as the stairways.  Check any carpeting to make sure that it is firmly attached to the stairs. Fix any carpet that is loose or worn.  Avoid having throw rugs at the top or bottom of the stairs. If you do have throw rugs, attach them to the floor with carpet tape.  Make sure that you have a light switch at the top of the stairs and the bottom of the stairs. If you do not have them, ask someone to add them for you. What else can I do to help prevent falls?  Wear shoes that:  Do not have high heels.  Have rubber bottoms.  Are comfortable and fit you well.  Are closed at the toe. Do not wear sandals.  If you use a stepladder:  Make sure that it is fully opened. Do not climb a closed stepladder.  Make sure that both  sides of the stepladder are locked into place.  Ask someone to hold it for you, if possible.  Clearly mark and make sure that you can see:  Any grab bars or handrails.  First and last steps.  Where the edge of each step is.  Use tools that help you move around (mobility aids) if they are needed. These include:  Canes.  Walkers.  Scooters.  Crutches.  Turn on the lights when you go into a dark area. Replace any light bulbs as soon as they burn out.  Set up your furniture so you have a clear path. Avoid moving your furniture around.  If any of your floors are uneven, fix them.  If there are any pets around you, be aware of where they are.  Review your medicines with your doctor. Some medicines can make you feel dizzy. This can increase your chance of falling. Ask your doctor what other things that you can do to help prevent falls. This information is not intended to replace advice given to you by your health care provider. Make sure you discuss any questions you have with your health care provider. Document Released: 10/24/2008 Document Revised: 06/05/2015 Document Reviewed: 02/01/2014 Elsevier Interactive Patient Education  2017 [employer].

## 2019-07-17 NOTE — Progress Notes (Addendum)
I connected with Luke Mckee today by telephone and verified that I am speaking with the correct person using two identifiers. Location patient: home Location provider: work Persons participating in the virtual visit: Luke Mckee, spouse Lezlie LyeCarol, Torra Pala LPN.   I discussed the limitations, risks, security and privacy concerns of performing an evaluation and management service by telephone and the availability of in person appointments. I also discussed with the patient that there may be a patient responsible charge related to this service. The patient expressed understanding and verbally consented to this telephonic visit.    Interactive audio and video telecommunications were attempted between this provider and patient, however failed, due to patient having technical difficulties OR patient did not have access to video capability.  We continued and completed visit with audio only.  Vital signs maybe patient reported or missing.     Subjective:   Luke Mckee is a 79 y.o. male who presents for Medicare Annual/Subsequent preventive examination.  Review of Systems     Cardiac Risk Factors include: advanced age (>4855men, 71>65 women);hypertension;male gender;obesity (BMI >30kg/m2);sedentary lifestyle     Objective:    Today's Vitals   07/17/19 1056  BP: 132/72  Pulse: 72  Temp: 98 F (36.7 C)  Weight: 246 lb (111.6 kg)  Height: 5\' 8"  (1.727 m)   Body mass index is 37.4 kg/m.  Advanced Directives 07/17/2019  Does Patient Have a Medical Advance Directive? No    Current Medications (verified) Outpatient Encounter Medications as of 07/17/2019  Medication Sig  . acetaminophen (TYLENOL 8 HOUR) 650 MG CR tablet Take 1,300 mg by mouth at bedtime.   Marland Kitchen. aspirin 81 MG chewable tablet Chew 81 mg by mouth daily.  Marland Kitchen. atenolol (TENORMIN) 50 MG tablet Take 1 tablet (50 mg total) by mouth at bedtime.  Marland Kitchen. atorvastatin (LIPITOR) 80 MG tablet TAKE 1 TABLET BY MOUTH DAILY  . cetirizine (ZYRTEC)  10 MG tablet Take 10 mg by mouth at bedtime.   . Cholecalciferol (VITAMIN D) 50 MCG (2000 UT) tablet Take 4,000 Units by mouth daily.  . clopidogrel (PLAVIX) 75 MG tablet Take 1 tablet (75 mg total) by mouth daily.  . diclofenac sodium (VOLTAREN) 1 % GEL Apply 2 g topically 4 (four) times daily as needed for pain. Foot pain  . docusate sodium (COLACE) 100 MG capsule Take 200 mg by mouth daily.   . furosemide (LASIX) 80 MG tablet Take 1 tablet (80 mg total) by mouth 2 (two) times daily.  Marland Kitchen. ipratropium (ATROVENT) 0.06 % nasal spray USE 2 SPRAYS IN EACH NOSTRIL THREE TIMES DAILY AS NEEDED FOR RHINITIS  . isosorbide mononitrate (IMDUR) 30 MG 24 hr tablet Take 1 tablet (30 mg total) by mouth daily.  Marland Kitchen. levothyroxine (SYNTHROID) 112 MCG tablet Take 1 tablet (112 mcg total) by mouth daily before breakfast.  . loratadine (CLARITIN) 10 MG tablet Take 10 mg by mouth daily.  Marland Kitchen. LORazepam (ATIVAN) 0.5 MG tablet Take 1 tablet (0.5 mg total) by mouth daily as needed for anxiety or sleep.  . metroNIDAZOLE (METROCREAM) 0.75 % cream Apply topically in the morning and at bedtime.  . Multiple Vitamin (MULTIVITAMIN) tablet Take 1 tablet by mouth daily.  . nitroGLYCERIN (NITROSTAT) 0.4 MG SL tablet Place 1 tablet (0.4 mg total) under the tongue every 5 (five) minutes as needed for chest pain.  Marland Kitchen. PATADAY 0.1 % ophthalmic solution   . senna (SENOKOT) 8.6 MG tablet Take 1 tablet by mouth daily.  . sertraline (ZOLOFT) 100 MG tablet  Take 1.5 tablets (150 mg total) by mouth daily.  Marland Kitchen spironolactone (ALDACTONE) 25 MG tablet Take 0.5 tablets (12.5 mg total) by mouth daily.   No facility-administered encounter medications on file as of 07/17/2019.    Allergies (verified) Ace inhibitors, Cephalexin, Crestor [rosuvastatin calcium], and Tape   History: Past Medical History:  Diagnosis Date  . Anxiety   . Arthritis   . CAD (coronary artery disease) 2005   s/p CABG, DES  . CHF (congestive heart failure) (HCC)    in EPIC  care everywhere  . Chronic kidney disease   . COPD (chronic obstructive pulmonary disease) (HCC)    in EPIC care everywhere  . Depression   . Heart murmur   . History of stroke   . Hypothyroidism   . OSA (obstructive sleep apnea)   . Paroxysmal A-fib (HCC)    in EPIC careeverywhere   Past Surgical History:  Procedure Laterality Date  . APPENDECTOMY    . BUNIONECTOMY    . CATARACT EXTRACTION     Right eye  . COLONOSCOPY  07/06/2004  . CORONARY ARTERY BYPASS GRAFT  2005  . LOOP RECORDER INSERTION N/A 07/25/2018   Procedure: LOOP RECORDER INSERTION;  Surgeon: Regan Lemming, MD;  Location: [facility];  Service: Cardiovascular;  Laterality: N/A;  . TOTAL HIP ARTHROPLASTY Right 11/23/2011  . TOTAL HIP ARTHROPLASTY Left 09/07/2011  . TOTAL KNEE ARTHROPLASTY Right 02/15/2018  . VASECTOMY  1978   Family History  Problem Relation Age of Onset  . Anxiety disorder Sister    Social History   Socioeconomic History  . Marital status: Married    Spouse name: Not on file  . Number of children: Not on file  . Years of education: Not on file  . Highest education level: Not on file  Occupational History  . Occupation: retired  Tobacco Use  . Smoking status: Never Smoker  . Smokeless tobacco: Never Used  Vaping Use  . Vaping Use: Never used  Substance and Sexual Activity  . Alcohol use: Not Currently    Comment: past  . Drug use: Never  . Sexual activity: Not on file  Other Topics Concern  . Not on file  Social History Narrative  . Not on file   Social Determinants of Health   Financial Resource Strain: Low Risk   . Difficulty of Paying Living Expenses: Not hard at all  Food Insecurity: No Food Insecurity  . Worried About Programme researcher, broadcasting/film/video in the Last Year: Never true  . Ran Out of Food in the Last Year: Never true  Transportation Needs: No Transportation Needs  . Lack of Transportation (Medical): No  . Lack of Transportation (Non-Medical): No  Physical  Activity: Inactive  . Days of Exercise per Week: 0 days  . Minutes of Exercise per Session: 0 min  Stress: No Stress Concern Present  . Feeling of Stress : Not at all  Social Connections: Unknown  . Frequency of Communication with Friends and Family: More than three times a week  . Frequency of Social Gatherings with Friends and Family: More than three times a week  . Attends Religious Services: Not on file  . Active Member of Clubs or Organizations: Not on file  . Attends Banker Meetings: Not on file  . Marital Status: Married    Tobacco Counseling Counseling given: Not Answered   Clinical Intake:  Pre-visit preparation completed: Yes  Pain : No/denies pain     Nutritional  Status: BMI > 30  Obese Nutritional Risks: None Diabetes: No  How often do you need to have someone help you when you read instructions, pamphlets, or other written materials from your doctor or pharmacy?: 1 - Never What is the last grade level you completed in school?: 12th grade  Diabetic? no  Interpreter Needed?: No  Information entered by :: NAllen LPN   Activities of Daily Living In your present state of health, do you have any difficulty performing the following activities: 07/17/2019 10/25/2018  Hearing? N N  Comment has hearing aides -  Vision? N N  Difficulty concentrating or making decisions? Y N  Comment sometimes -  Walking or climbing stairs? Y Y  Comment due to balance -  Dressing or bathing? N N  Doing errands, shopping? Y N  Comment always has someone with him -  Quarry manager and eating ? N -  Using the Toilet? N -  In the past six months, have you accidently leaked urine? N -  Do you have problems with loss of bowel control? Y -  Comment due to what he ate -  Managing your Medications? Y -  Comment spouse manages -  Managing your Finances? N -  Housekeeping or managing your Housekeeping? N -  Some recent data might be hidden    Patient Care  Team: [name], DO as PCP - General (Family Medicine) [place], Jackelyn Poling, [facility] as Pharmacist [name], RN as Case Manager (General Practice)  Indicate any recent Medical Services you may have received from other than [facility] providers in the past year (date may be approximate).     Assessment:   This is a routine wellness examination for Luke Mckee.  Hearing/Vision screen  Hearing Screening             Right ear:           Left ear:           Vision Screening Comments: Regular eye exams. Dr. Chipper Herb  Dietary issues and exercise activities discussed: Current Exercise Habits: The patient does not participate in regular exercise at present  Goals    .  Patient Stated      07/17/2019, wants to walk without falling, get balance back    .  PharmD - Medication Management      Current Barriers:  . Chronic Disease Management support, education, and care coordination needs related to CAD, HTN, HLD, and CVA  Pharmacist Clinical Goal(s):  Marland Kitchen Over the next 30 days, patient will work with CM Pharmacist to address medication management needs  Interventions: . Receive message from [facility] Nurse Case Manager & PCP requesting information re: potential drug-drug interaction -patient advised by Dermatologist that if started on course of oral Lamisil, would need to temporarily discontinue atorvastatin . Review potential for interaction between terbinafine (Lamisil) and atorvastatin. No interaction identified. o Note some azole antifungals, such as itraconazole, are strong inhibitors of the CYP3A4 liver enzyme and temporary discontinuation of the statin agent used to avoid the interaction - From review of available literature, note use of a non-CYP3A4-inhibiting antifungal drug such as terbinafine is recommended as an alternative to discontinuing statin treatment in the case of such an interaction. Marland Kitchen Collaborate with PCP and CCM Nurse Case  Manager o Do not recommend holding patient's atorvastatin with the start of terbinafine.  o Recommend checking liver function before starting course of terbinafine and counseling patient on monitoring during therapy.  . Place coordination of care  call to [facility]. Leave message requesting a call back.  References: Dybro AM, Damkier P, Rasmussen TB, Hellfritzsch M. Statin-associated rhabdomyolysis triggered by drug-drug interaction with itraconazole. BMJ Case Rep. 2016 Sep 7;2016:[id number]. doi: 10.1136/bcr-2016-216457. PMID: 02637858; PMCID: [id number].  [employer]. TERBINAFINE HYDROCHLORIDE tablet. 2009 Irene Pap. 2020 Nov; cited 2021 July] In: DailyMed [Internet]. Lafe Garin (MD): [employer] ([place]). Available from: [email or web address].  [employer]. LIPITOR- atorvastatin calcium tablet, film coated. 1996 Irene Pap 2020 Dec; cited 2021 July] In: DailyMed [Internet]. Lafe Garin (MD): [employer] ([place]). Available from: [email or web address]://[email or web address].info/  Patient Self Care Activities:  . Attends all scheduled provider appointments . Calls pharmacy for medication refills . Calls provider office for new concerns or questions  Please see past updates related to this goal by clicking on the "Past Updates" button in the selected goal      .  RNCM- I would like to complete my HCPOA (pt-stated)      Current Barriers:  . Patient new to the area, limited resources/areas to complete HCPOA related to Covid-19 closures  Clinical Care Manager Clinical Goal(s):  Marland Kitchen Over the next 60 days, the patient will complete mailed Advance Directive packet with the assistance of RNCM  Interventions: . Interviewed patient about Designer, industrial/product and provided education about the importance of completing advanced  directives . Mailed the patient an EMMI educational handout on Advance Directives as well as an Engineer, production . Discussed the Advanced directives but the patient did not elaborate. The patient denies any new questions related to AD.  Patient Self Care Activities:  . Self administers medications as prescribed . Attends all scheduled provider appointments . Performs ADL's independently . Calls provider office for new concerns or questions  Please see past updates related to this goal by clicking on the "Past Updates" button in the selected goal        .  RNCM- Pt's wife: "They want [name] to do another sleep apnea test" (pt-stated)      CARE PLAN ENTRY (see longitudinal plan of care for additional care plan information)  Current Barriers:  Marland Kitchen Knowledge Deficits related to the need for a new sleep apnea study in the home setting and concerns with the patient not being able to use his cpap machine while doing the study.  . Care Coordination needs related to sleep apnea in a patient with obstructive sleep apnea (disease states) . Chronic Disease Management support and education needs related to obstructive sleep apnea . Spouse concern over in home study for sleep apnea and request the patient have the study done in the clinical setting  Nurse Case Manager Clinical Goal(s):  Marland Kitchen Over the next 120 days, patient will verbalize understanding of plan for obtaining needed information for adequate therapy and treatment related to obstructive sleep apnea.  . Over the next 120 days, patient will work with [facility] and pcp to address needs related to the need for a new sleep apnea test.  . Over the next 120 days, patient will attend all scheduled medical appointments: next appointment with pcp 10-17-2019 but may need an earlier appointment  Interventions:  . Inter-disciplinary care team collaboration (see longitudinal plan of care) . Evaluation of current treatment plan related to sleep apnea and  patient's adherence to plan as established by provider. . Advised patient to continue current treatment regimen at this time . Provided education to patient re: working with the pcp to decide the best course of action to take in  meeting needs related to obstructive sleep apnea . Collaborated with pcp regarding the spouses concern over having the sleep study at home without use of his current cpap machine versus having study done in the clinical setting with health professionals available  . Discussed plans with patient for ongoing care management follow up and provided patient with direct contact information for care management team  Patient Self Care Activities:  . Patient verbalizes understanding of plan to work with pcp and CCM team to meet needs related to obstructive sleep apnea . Attends all scheduled provider appointments . Calls provider office for new concerns or questions . Unable to independently do sleep study without the use of cpap due to obstructive sleep apnea  Initial goal documentation     .  RNCM: I have not started exercising yet      CARE PLAN ENTRY (see longtitudinal plan of care for additional care plan information)  Current Barriers:  . Chronic Disease Management support, education, and care coordination needs related to CAD, HTN, HLD, and CVA  Clinical Goal(s) related to CAD, HTN, HLD, and CVA :  Over the next 90 days, patient will:  . Work with the care management team to address educational, disease management, and care coordination needs  . Begin or continue self health monitoring activities as directed today Measure and record blood pressure 4 times per week and start exercise plan per recommendations of neurologist with goal of 15 mins per day . Call provider office for new or worsened signs and symptoms Blood pressure findings outside established parameters, Shortness of breath, and New or worsened symptom related to HLD and CVA . Call care management team  with questions or concerns . Verbalize basic understanding of patient centered plan of care established today  Interventions related to CAD, HTN, HLD, and CVA :  . Evaluation of current treatment plans and patient's adherence to plan as established by provider . Education on writing blood pressure readings down so written log could be taken to the provider office on visits.  The patients wife is writing the patients blood pressures down. [name].  Per the wife the patient was very active that day. [name].  Oxygen sats are 92-96%.  The patient remains stable with blood pressures and heart rate. Oxygen sats are stable as well.  . Assessed patient understanding of disease states.  Good understanding of chronic diseases and how to effectively manage . Assessed patient's education and care coordination needs . Assessed patient's exercise patterns. The patient has started an exercise regimen, he and his wife purchased a [place] and he is doing 2000 steps daily. This takes him approximately 30 minutes to do.  He feels this is also helping his balance.  The neurologist recommended last week the patient start exercising and do 15 minutes per day. Per the wife he does take some days off but he is still continuing to use regularly. The patient continues to use the [place] and is doing well.  . Provided disease specific education to patient. Education and review. Endorses compliance . Collaborated with appropriate clinical care team members regarding patient needs . Reviewed upcoming appointments: pcp appointment on 10-17-2019 at 2 pm . Education on call back to the patient that Dr. [name] has sent a referral to [facility] in [place]. Information provided that they will call  the patient for appointment information. The wife took down their address and number.  She verbalized understanding of them reaching out to the patient for an appointment. The patient had called earlier asking for a referral to see a podiatrist for foot care and nails trimmed. Was told at the [place] that this would be a good thing for him to do. 07-09-2019: The patient has been evaluated at the foot center and has had his toenails trimmed. The podiatrist told the patient and the patients wife that he needed Lamisil for fungus present on nails and that he needs to take it in pill form not topical application.  The problem is that Lamisil reacts against Atorvastatin and the patient would have to go off of the Atorvastatin for 3 months. Will send a message by in basket asking Dr. [name] for recommendations and possibly if the patient needs to come in to be seen before 10-17-2019 appointment. Will also involve CCM pharmacist for support. The patient is concerned about the fungus but education provided about the risk versus the benefits in light of the patients cardiovascular risk and past history of stroke.  Will collaborate with pcp.  . Evaluation of visit to the dermatologist. The patient had 8 pre-cancerous places removed from his arms, hands, temple and trunk. They have blistered over and the patient is putting Vaseline and Band-Aids on the area as instructed by the provider.  . Evaluation of visit to the [place].  The patients visit went well. The patient seems to be stable at this time. Will continue to monitor for changes.   CAD, HTN, HLD, and CVA :  . Patient is unable to independently self-manage chronic health conditions  Please see past updates related to this goal by clicking on the "Past Updates" button in the selected goal        Depression Screen PHQ 2/9 Scores 07/17/2019 04/16/2019 03/05/2019 10/25/2018 07/31/2018 07/20/2018 06/07/2018  PHQ - 2 Score 1 2 0 0 0 0 2  PHQ- 9 Score 3 7 - - 4 4 6     Fall Risk Fall Risk  07/17/2019 10/25/2018 07/20/2018 06/07/2018  Falls in the past year? 1 0 1 0   Comment losing balance - - -  Number falls in past yr: 1 - 1 -  Injury with Fall? 0 - 0 -  Risk for fall due to : Impaired balance/gait;History of fall(s);Impaired mobility;Medication side effect - Impaired balance/gait -  Follow up Falls evaluation completed;Education provided;Falls prevention discussed Falls evaluation completed - Falls evaluation completed    Any stairs in or around the home? Yes  If so, are there any without handrails? Yes  Home free of loose throw rugs in walkways, pet beds, electrical cords, etc? Yes  Adequate lighting in your home to reduce risk of falls? Yes   ASSISTIVE DEVICES UTILIZED TO PREVENT FALLS:  Life alert? No  Use of a cane, walker or w/c? No  Grab bars in the bathroom? Yes  Shower chair or bench in shower? No  Elevated toilet seat or a handicapped toilet? Yes   TIMED UP AND GO:  Was the test performed? No .    Cognitive Function:     6CIT Screen 07/17/2019  What Year? 0 points  What month? 0 points  What time? 0 points  Count back from 20 0 points  Months in reverse 0 points  Repeat phrase 4 points  Total Score 4    Immunizations Immunization History  Administered Date(s) Administered  . Influenza,  High Dose Seasonal PF 09/05/2018  . Moderna SARS-COVID-2 Vaccination 02/23/2019, 03/24/2019  . Pneumococcal Conjugate-13 11/08/2014  . Pneumococcal Polysaccharide-23 11/23/2000, 03/01/2008  . Td 03/01/2008  . Zoster Recombinat (Shingrix) 06/25/2009    TDAP status: Due, Education has been provided regarding the importance of this vaccine. Advised may receive this vaccine at local pharmacy or Health Dept. Aware to provide a copy of the vaccination record if obtained from local pharmacy or Health Dept. Verbalized acceptance and understanding. Flu Vaccine status: Up to date Pneumococcal vaccine status: Up to date Covid-19 vaccine status: Completed vaccines  Qualifies for Shingles Vaccine? Yes   Zostavax completed yes  Shingrix  Completed?: No.    Education has been provided regarding the importance of this vaccine. Patient has been advised to call insurance company to determine out of pocket expense if they have not yet received this vaccine. Advised may also receive vaccine at local pharmacy or Health Dept. Verbalized acceptance and understanding.  Screening Tests Health Maintenance  Topic Date Due  . Hepatitis C Screening  Never done  . TETANUS/TDAP  03/01/2018  . INFLUENZA VACCINE  08/12/2019  . COVID-19 Vaccine  Completed  . PNA vac Low Risk Adult  Completed    Health Maintenance  Health Maintenance Due  Topic Date Due  . Hepatitis C Screening  Never done  . TETANUS/TDAP  03/01/2018    Colorectal cancer screening: Working on sending off cologuard  Lung Cancer Screening: (Low Dose CT Chest recommended if Age 39-80 years, 30 pack-year currently smoking OR have quit w/in 15years.) does not qualify.   Lung Cancer Screening Referral: no  Additional Screening:  Hepatitis C Screening: does qualify; Due  Vision Screening: Recommended annual ophthalmology exams for early detection of glaucoma and other disorders of the eye. Is the patient up to date with their annual eye exam?  Yes  Who is the provider or what is the name of the office in which the patient attends annual eye exams? Dr. Chipper Herb If pt is not established with a provider, would they like to be referred to a provider to establish care? No .   Dental Screening: Recommended annual dental exams for proper oral hygiene  Community Resource Referral / Chronic Care Management: CRR required this visit?  No   CCM required this visit?  No      Plan:     I have personally reviewed and noted the following in the patient's chart:   . Medical and social history . Use of alcohol, tobacco or illicit drugs  . Current medications and supplements . Functional ability and status . Nutritional status . Physical activity . Advanced directives . List  of other physicians . Hospitalizations, surgeries, and ER visits in previous 12 months . Vitals . Screenings to include cognitive, depression, and falls . Referrals and appointments  In addition, I have reviewed and discussed with patient certain preventive protocols, quality metrics, and best practice recommendations. A written personalized care plan for preventive services as well as general preventive health recommendations were provided to patient.   Due to this being a telephonic visit, the after visit summary with patients personalized plan was offered to patient via mail or my-chart.Patient would like to access on my-chart  Barb Merino, LPN   01/11/9145   Nurse Notes: Due for TDAP and Hep C screening

## 2019-07-18 ENCOUNTER — Ambulatory Visit: Payer: Self-pay | Admitting: Pharmacist

## 2019-07-18 ENCOUNTER — Telehealth: Payer: Self-pay

## 2019-07-18 ENCOUNTER — Ambulatory Visit (INDEPENDENT_AMBULATORY_CARE_PROVIDER_SITE_OTHER): Payer: Medicare PPO | Admitting: General Practice

## 2019-07-18 DIAGNOSIS — E782 Mixed hyperlipidemia: Secondary | ICD-10-CM

## 2019-07-18 DIAGNOSIS — I6930 Unspecified sequelae of cerebral infarction: Secondary | ICD-10-CM

## 2019-07-18 DIAGNOSIS — I10 Essential (primary) hypertension: Secondary | ICD-10-CM

## 2019-07-18 DIAGNOSIS — Z9989 Dependence on other enabling machines and devices: Secondary | ICD-10-CM

## 2019-07-18 DIAGNOSIS — G4733 Obstructive sleep apnea (adult) (pediatric): Secondary | ICD-10-CM

## 2019-07-18 DIAGNOSIS — I25810 Atherosclerosis of coronary artery bypass graft(s) without angina pectoris: Secondary | ICD-10-CM

## 2019-07-18 DIAGNOSIS — F419 Anxiety disorder, unspecified: Secondary | ICD-10-CM

## 2019-07-18 NOTE — Telephone Encounter (Signed)
Luke Mckee from Dr [name] office called to let Dr Kirtland Bouchard know  Dr Cheron Schaumann do not see any reason why this pt will need to stop Atorvastatin  while taking Lamisil tablets, Dr Kirtland Bouchard can reference this information on note dated 07-13-19 in this chart

## 2019-07-18 NOTE — Chronic Care Management (AMB) (Signed)
Chronic Care Management   Follow Up Note   07/18/2019 Name: Luke Mckee MRN: [id number] DOB: [birth year]  Referred by: [name], DO Reason for referral : Care Coordination (Dermatology)   Luke Mckee is a 78 y.o. year old male who is a primary care patient of [name], DO. The CCM team was consulted for assistance with chronic disease management and care coordination needs.    Coordination of care call and InBasket message back to [facility].   Review of patient status, including review of consultants reports, relevant laboratory and other test results, and collaboration with appropriate care team members and the patient's provider was performed as part of comprehensive patient evaluation and provision of chronic care management services.     Outpatient Encounter Medications as of 07/18/2019  Medication Sig  . acetaminophen (TYLENOL 8 HOUR) 650 MG CR tablet Take 1,300 mg by mouth at bedtime.   Marland Kitchen aspirin 81 MG chewable tablet Chew 81 mg by mouth daily.  Marland Kitchen atenolol (TENORMIN) 50 MG tablet Take 1 tablet (50 mg total) by mouth at bedtime.  Marland Kitchen atorvastatin (LIPITOR) 80 MG tablet TAKE 1 TABLET BY MOUTH DAILY  . cetirizine (ZYRTEC) 10 MG tablet Take 10 mg by mouth at bedtime.   . Cholecalciferol (VITAMIN D) 50 MCG (2000 UT) tablet Take 4,000 Units by mouth daily.  . clopidogrel (PLAVIX) 75 MG tablet Take 1 tablet (75 mg total) by mouth daily.  . diclofenac sodium (VOLTAREN) 1 % GEL Apply 2 g topically 4 (four) times daily as needed for pain. Foot pain  . docusate sodium (COLACE) 100 MG capsule Take 200 mg by mouth daily.   . furosemide (LASIX) 80 MG tablet Take 1 tablet (80 mg total) by mouth 2 (two) times daily.  Marland Kitchen ipratropium (ATROVENT) 0.06 % nasal spray USE 2 SPRAYS IN EACH NOSTRIL THREE TIMES DAILY AS NEEDED FOR RHINITIS  . isosorbide mononitrate (IMDUR) 30 MG 24 hr tablet Take 1 tablet (30 mg total) by mouth daily.  Marland Kitchen levothyroxine (SYNTHROID) 112  MCG tablet Take 1 tablet (112 mcg total) by mouth daily before breakfast.  . loratadine (CLARITIN) 10 MG tablet Take 10 mg by mouth daily.  Marland Kitchen LORazepam (ATIVAN) 0.5 MG tablet Take 1 tablet (0.5 mg total) by mouth daily as needed for anxiety or sleep.  . metroNIDAZOLE (METROCREAM) 0.75 % cream Apply topically in the morning and at bedtime.  . Multiple Vitamin (MULTIVITAMIN) tablet Take 1 tablet by mouth daily.  . nitroGLYCERIN (NITROSTAT) 0.4 MG SL tablet Place 1 tablet (0.4 mg total) under the tongue every 5 (five) minutes as needed for chest pain.  Marland Kitchen PATADAY 0.1 % ophthalmic solution   . senna (SENOKOT) 8.6 MG tablet Take 1 tablet by mouth daily.  . sertraline (ZOLOFT) 100 MG tablet Take 1.5 tablets (150 mg total) by mouth daily.  Marland Kitchen spironolactone (ALDACTONE) 25 MG tablet Take 0.5 tablets (12.5 mg total) by mouth daily.   No facility-administered encounter medications on file as of 07/18/2019.    Goals Addressed            This Visit's Progress   . PharmD - Medication Management       Current Barriers:  . Chronic Disease Management support, education, and care coordination needs related to CAD, HTN, HLD, and CVA  Pharmacist Clinical Goal(s):  Marland Kitchen Over the next 30 days, patient will work with CM Pharmacist to address medication management needs  Interventions: . Previously received message from [facility] Nurse Case Manager &  PCP requesting information re: potential drug-drug interaction -patient advised by Dermatologist that if started on course of oral Lamisil, would need to temporarily discontinue atorvastatin . Reviewed potential for interaction between terbinafine (Lamisil) and atorvastatin. No interaction identified. o Note some azole antifungals, such as itraconazole, are strong inhibitors of the CYP3A4 liver enzyme and temporary discontinuation of the statin agent used to avoid the interaction - From review of available literature, note use of a non-CYP3A4-inhibiting antifungal drug such  as terbinafine is recommended as an alternative to discontinuing statin treatment in the case of such an interaction. . Coordination of care InBasket message sent to Dr. Gwen Pounds and PCP . Coordination of care call back to [facility]. Leave message with CMA Manson Passey lets me know that Dr. Gwen Pounds will be out of the office until Monday, 7/12  References: Dybro AM, Damkier P, Rasmussen TB, Hellfritzsch M. Statin-associated rhabdomyolysis triggered by drug-drug interaction with itraconazole. BMJ Case Rep. 2016 Sep 7;2016:[id number]. doi: 10.1136/bcr-2016-216457. PMID: 30076226; PMCID: [id number].  [employer]. TERBINAFINE HYDROCHLORIDE tablet. 2009 Irene Pap. 2020 Nov; cited 2021 July] In: DailyMed [Internet]. Lafe Garin (MD): [employer] ([place]). Available from: https://[email or web address].com.cy.  [employer]. LIPITOR- atorvastatin calcium tablet, film coated. 1996 Irene Pap 2020 Dec; cited 2021 July] In: DailyMed [Internet]. Lafe Garin (MD): [employer] ([place]). Available from: https://dailymed.https://harris.info/  Patient Self Care Activities:  . Attends all scheduled provider appointments . Calls pharmacy for medication refills . Calls provider office for new concerns or questions  Please see past updates related to this goal by clicking on the "Past Updates" button in the selected goal         Plan  The care management team will reach out to the patient again over the next 30 days.   Duanne Moron, PharmD, [facility] Clinical Pharmacist [facility] [employer] [phone]

## 2019-07-18 NOTE — Chronic Care Management (AMB) (Signed)
Chronic Care Management   Follow Up Note   07/18/2019 Name: Levone Otten MRN: [id number] DOB: July 05, 1940  Referred by: [name], DO Reason for referral : Chronic Care Management (call back to give information from MD and pharmacist )   Luisenrique Conran is a 79 y.o. year old male who is a primary care patient of [name], DO. The CCM team was consulted for assistance with chronic disease management and care coordination needs.    Review of patient status, including review of consultants reports, relevant laboratory and other test results, and collaboration with appropriate care team members and the patient's provider was performed as part of comprehensive patient evaluation and provision of chronic care management services.    SDOH (Social Determinants of Health) assessments performed: Yes See Care Plan activities for detailed interventions related to [facility])     Outpatient Encounter Medications as of 07/18/2019  Medication Sig  . acetaminophen (TYLENOL 8 HOUR) 650 MG CR tablet Take 1,300 mg by mouth at bedtime.   Marland Kitchen aspirin 81 MG chewable tablet Chew 81 mg by mouth daily.  Marland Kitchen atenolol (TENORMIN) 50 MG tablet Take 1 tablet (50 mg total) by mouth at bedtime.  Marland Kitchen atorvastatin (LIPITOR) 80 MG tablet TAKE 1 TABLET BY MOUTH DAILY  . cetirizine (ZYRTEC) 10 MG tablet Take 10 mg by mouth at bedtime.   . Cholecalciferol (VITAMIN D) 50 MCG (2000 UT) tablet Take 4,000 Units by mouth daily.  . clopidogrel (PLAVIX) 75 MG tablet Take 1 tablet (75 mg total) by mouth daily.  . diclofenac sodium (VOLTAREN) 1 % GEL Apply 2 g topically 4 (four) times daily as needed for pain. Foot pain  . docusate sodium (COLACE) 100 MG capsule Take 200 mg by mouth daily.   . furosemide (LASIX) 80 MG tablet Take 1 tablet (80 mg total) by mouth 2 (two) times daily.  Marland Kitchen ipratropium (ATROVENT) 0.06 % nasal spray USE 2 SPRAYS IN EACH NOSTRIL THREE TIMES DAILY AS NEEDED FOR RHINITIS  . isosorbide mononitrate  (IMDUR) 30 MG 24 hr tablet Take 1 tablet (30 mg total) by mouth daily.  Marland Kitchen levothyroxine (SYNTHROID) 112 MCG tablet Take 1 tablet (112 mcg total) by mouth daily before breakfast.  . loratadine (CLARITIN) 10 MG tablet Take 10 mg by mouth daily.  Marland Kitchen LORazepam (ATIVAN) 0.5 MG tablet Take 1 tablet (0.5 mg total) by mouth daily as needed for anxiety or sleep.  . metroNIDAZOLE (METROCREAM) 0.75 % cream Apply topically in the morning and at bedtime.  . Multiple Vitamin (MULTIVITAMIN) tablet Take 1 tablet by mouth daily.  . nitroGLYCERIN (NITROSTAT) 0.4 MG SL tablet Place 1 tablet (0.4 mg total) under the tongue every 5 (five) minutes as needed for chest pain.  Marland Kitchen PATADAY 0.1 % ophthalmic solution   . senna (SENOKOT) 8.6 MG tablet Take 1 tablet by mouth daily.  . sertraline (ZOLOFT) 100 MG tablet Take 1.5 tablets (150 mg total) by mouth daily.  Marland Kitchen spironolactone (ALDACTONE) 25 MG tablet Take 0.5 tablets (12.5 mg total) by mouth daily.   No facility-administered encounter medications on file as of 07/18/2019.     Objective:   Goals Addressed              This Visit's Progress   .  RNCM- Pt's wife: "They want [name] to do another sleep apnea test" (pt-stated)        CARE PLAN ENTRY (see longitudinal plan of care for additional care plan information)  Current Barriers:  Marland Kitchen Knowledge  Deficits related to the need for a new sleep apnea study in the home setting and concerns with the patient not being able to use his cpap machine while doing the study.  . Care Coordination needs related to sleep apnea in a patient with obstructive sleep apnea (disease states) . Chronic Disease Management support and education needs related to obstructive sleep apnea . Spouse concern over in home study for sleep apnea and request the patient have the study done in the clinical setting  Nurse Case Manager Clinical Goal(s):  Marland Kitchen. Over the next 120 days, patient will verbalize understanding of plan for obtaining needed  information for adequate therapy and treatment related to obstructive sleep apnea.  . Over the next 120 days, [facility] to address needs related to the need for a new sleep apnea test.  . Over the next 120 days, patient will attend all scheduled medical appointments: next appointment with pcp 10-17-2019 but may need an earlier appointment  Interventions:  . Inter-disciplinary care team collaboration (see longitudinal plan of care) . Evaluation of current treatment plan related to sleep apnea and patient's adherence to plan as established by provider. . Advised patient to continue current treatment regimen at this time . Provided education to patient re: working with the pcp to decide the best course of action to take in meeting needs related to obstructive sleep apnea . Collaborated with pcp regarding the spouses concern over having the sleep study at home without use of his current cpap machine versus having study done in the clinical setting with health professionals available.  07-18-2019: Spoke to the patients wife. She does not know who told them to do this and could not find the paperwork. Education provided that the team would need to know why there needed to be a new sleep study and that Dr. Althea CharonKaramalegos would be willing to talk to the referring party to see what could be arranged. The RNCM will do research as time permits to see what information about referral and needs of the patient are.  Will monitor and communicate findings as appropriate.  . Discussed plans with patient for ongoing care management follow up and provided patient with direct contact information for care management team  Patient Self Care Activities:  . Patient verbalizes understanding of plan to work with pcp and CCM team to meet needs related to obstructive sleep apnea . Attends all scheduled provider appointments . Calls provider office for new concerns or questions . Unable to independently do  sleep study without the use of cpap due to obstructive sleep apnea  Please see past updates related to this goal by clicking on the "Past Updates" button in the selected goal      .  RNCM: I have not started exercising yet        CARE PLAN ENTRY (see longtitudinal plan of care for additional care plan information)  Current Barriers:  . Chronic Disease Management support, education, and care coordination needs related to CAD, HTN, HLD, and CVA  Clinical Goal(s) related to CAD, HTN, HLD, and CVA :  Over the next 90 days, patient will:  . Work with the care management team to address educational, disease management, and care coordination needs  . Begin or continue self health monitoring activities as directed today Measure and record blood pressure 4 times per week and start exercise plan per recommendations of neurologist with goal of 15 mins per day . Call provider office for new or  worsened signs and symptoms Blood pressure findings outside established parameters, Shortness of breath, and New or worsened symptom related to HLD and CVA . Call care management team with questions or concerns . Verbalize basic understanding of patient centered plan of care established today  Interventions related to CAD, HTN, HLD, and CVA :  . Evaluation of current treatment plans and patient's adherence to plan as established by provider . Education on writing blood pressure readings down so written log could be taken to the provider office on visits.  The patients wife is writing the patients blood pressures down. [name].  Per the wife the patient was very active that day. [name].  Oxygen sats are 92-96%.  The patient remains stable with blood pressures and heart rate. Oxygen sats are stable as well.  . Assessed patient understanding of disease states.  Good understanding of  chronic diseases and how to effectively manage . Assessed patient's education and care coordination needs . Assessed patient's exercise patterns. The patient has started an exercise regimen, he and his wife purchased a [place] and he is doing 2000 steps daily. This takes him approximately 30 minutes to do.  He feels this is also helping his balance.  The neurologist recommended last week the patient start exercising and do 15 minutes per day. Per the wife he does take some days off but he is still continuing to use regularly. The patient continues to use the [place] and is doing well.  . Provided disease specific education to patient. Education and review. Endorses compliance . Collaborated with appropriate clinical care team members regarding patient needs . Reviewed upcoming appointments: pcp appointment on 10-17-2019 at 2 pm . Education on call back to the patient that Dr. [name] has sent a referral to [facility] in [place]. Information provided that they will call the patient for appointment information. The wife took down their address and number. She verbalized understanding of them reaching out to the patient for an appointment. The patient had called earlier asking for a referral to see a podiatrist for foot care and nails trimmed. Was told at the [place] that this would be a good thing for him to do. 07-09-2019: The patient has been evaluated at the foot center and has had his toenails trimmed. The podiatrist told the patient and the patients wife that he needed Lamisil for fungus present on nails and that he needs to take it in pill form not topical application.  The problem is that Lamisil reacts against Atorvastatin and the patient would have to go off of the Atorvastatin for 3 months. Will send a message by in basket asking Dr. [name] for recommendations and possibly if the patient needs to come in to be seen before 10-17-2019 appointment. Will also involve CCM pharmacist for  support. The patient is concerned about the fungus but education provided about the risk versus the benefits in light of the patients cardiovascular risk and past history of stroke.  Will collaborate with pcp. 07-18-2019: Called the patient and patients wife back. The Pharm D and pcp have communicated with the RNCM that there is no known interaction with the Lamisil and Atorvastatin. Communicated the information to the patient and the patients wife and gave instructions for lab work for liver panel as requested by Dr. [name]. The wife stated the dermatologist and  the dermatologist's nurse were adamant that the patient not take Lamisil with Atorvastatin together and she was not comfortable with the patient taking it.  She also was concerned about the high dose of Lasix the patient now has to take. The patients wife ask if Dr. Francina Ames would talk to the dermatologist when he returns next week.  Will relay information to Dr. [name] and Pharm D Duanne Moron.  . Evaluation of visit to the dermatologist. The patient had 8 pre-cancerous places removed from his arms, hands, temple and trunk. They have blistered over and the patient is putting Vaseline and Band-Aids on the area as instructed by the provider.  . Evaluation of visit to the [place].  The patients visit went well. The patient seems to be stable at this time. Will continue to monitor for changes.   CAD, HTN, HLD, and CVA :  . Patient is unable to independently self-manage chronic health conditions  Please see past updates related to this goal by clicking on the "Past Updates" button in the selected goal          Plan:   The care management team will reach out to the patient again over the next 30 to 60 days.    Alto Denver RN, MSN, CCM Community Care Coordinator [place]  [facility] [place] Mobile: [phone]

## 2019-07-18 NOTE — Patient Instructions (Signed)
Visit Information  Goals Addressed              This Visit's Progress   .  RNCM- Pt's wife: "They want Luke Mckee to do another sleep apnea test" (pt-stated)        CARE PLAN ENTRY (see longitudinal plan of care for additional care plan information)  Current Barriers:  Marland Kitchen Knowledge Deficits related to the need for a new sleep apnea study in the home setting and concerns with the patient not being able to use his cpap machine while doing the study.  . Care Coordination needs related to sleep apnea in a patient with obstructive sleep apnea (disease states) . Chronic Disease Management support and education needs related to obstructive sleep apnea . Spouse concern over in home study for sleep apnea and request the patient have the study done in the clinical setting  Nurse Case Manager Clinical Goal(s):  Marland Kitchen Over the next 120 days, patient will verbalize understanding of plan for obtaining needed information for adequate therapy and treatment related to obstructive sleep apnea.  . Over the next 120 days, patient will work with [facility] and pcp to address needs related to the need for a new sleep apnea test.  . Over the next 120 days, patient will attend all scheduled medical appointments: next appointment with pcp 10-17-2019 but may need an earlier appointment  Interventions:  . Inter-disciplinary care team collaboration (see longitudinal plan of care) . Evaluation of current treatment plan related to sleep apnea and patient's adherence to plan as established by provider. . Advised patient to continue current treatment regimen at this time . Provided education to patient re: working with the pcp to decide the best course of action to take in meeting needs related to obstructive sleep apnea . Collaborated with pcp regarding the spouses concern over having the sleep study at home without use of his current cpap machine versus having study done in the clinical setting with health professionals available.   07-18-2019: Spoke to the patients wife. She does not know who told them to do this and could not find the paperwork. Education provided that the team would need to know why there needed to be a new sleep study and that Dr. [name] would be willing to talk to the referring party to see what could be arranged. The RNCM will do research as time permits to see what information about referral and needs of the patient are.  Will monitor and communicate findings as appropriate.  . Discussed plans with patient for ongoing care management follow up and provided patient with direct contact information for care management team  Patient Self Care Activities:  . Patient verbalizes understanding of plan to work with pcp and CCM team to meet needs related to obstructive sleep apnea . Attends all scheduled provider appointments . Calls provider office for new concerns or questions . Unable to independently do sleep study without the use of cpap due to obstructive sleep apnea  Please see past updates related to this goal by clicking on the "Past Updates" button in the selected goal      .  RNCM: I have not started exercising yet        CARE PLAN ENTRY (see longtitudinal plan of care for additional care plan information)  Current Barriers:  . Chronic Disease Management support, education, and care coordination needs related to CAD, HTN, HLD, and CVA  Clinical Goal(s) related to CAD, HTN, HLD, and CVA :  Over the next 90  days, patient will:  . Work with the care management team to address educational, disease management, and care coordination needs  . Begin or continue self health monitoring activities as directed today Measure and record blood pressure 4 times per week and start exercise plan per recommendations of neurologist with goal of 15 mins per day . Call provider office for new or worsened signs and symptoms Blood pressure findings outside established parameters, Shortness of breath, and New or  worsened symptom related to HLD and CVA . Call care management team with questions or concerns . Verbalize basic understanding of patient centered plan of care established today  Interventions related to CAD, HTN, HLD, and CVA :  . Evaluation of current treatment plans and patient's adherence to plan as established by provider . Education on writing blood pressure readings down so written log could be taken to the provider office on visits.  The patients wife is writing the patients blood pressures down. [name].  Per the wife the patient was very active that day. [name].  Oxygen sats are 92-96%.  The patient remains stable with blood pressures and heart rate. Oxygen sats are stable as well.  . Assessed patient understanding of disease states.  Good understanding of chronic diseases and how to effectively manage . Assessed patient's education and care coordination needs . Assessed patient's exercise patterns. The patient has started an exercise regimen, he and his wife purchased a [place] and he is doing 2000 steps daily. This takes him approximately 30 minutes to do.  He feels this is also helping his balance.  The neurologist recommended last week the patient start exercising and do 15 minutes per day. Per the wife he does take some days off but he is still continuing to use regularly. The patient continues to use the [place] and is doing well.  . Provided disease specific education to patient. Education and review. Endorses compliance . Collaborated with appropriate clinical care team members regarding patient needs . Reviewed upcoming appointments: pcp appointment on 10-17-2019 at 2 pm . Education on call back to the patient that Dr. [name] has sent a referral to [facility] in [place]. Information provided that they will call the patient for  appointment information. The wife took down their address and number. She verbalized understanding of them reaching out to the patient for an appointment. The patient had called earlier asking for a referral to see a podiatrist for foot care and nails trimmed. Was told at the [place] that this would be a good thing for him to do. 07-09-2019: The patient has been evaluated at the foot center and has had his toenails trimmed. The podiatrist told the patient and the patients wife that he needed Lamisil for fungus present on nails and that he needs to take it in pill form not topical application.  The problem is that Lamisil reacts against Atorvastatin and the patient would have to go off of the Atorvastatin for 3 months. Will send a message by in basket asking Dr. [name] for recommendations and possibly if the patient needs to come in to be seen before 10-17-2019 appointment. Will also involve CCM pharmacist for support. The patient is concerned about the fungus but education provided about the risk versus the benefits in light of the patients cardiovascular risk and past history of stroke.  Will collaborate with pcp. 07-18-2019: Called the patient and patients wife back. The Pharm D and pcp have communicated with the RNCM that there is no known interaction with the Lamisil and Atorvastatin. Communicated the information to the patient and the patients wife and gave instructions for lab work for liver panel as requested by Dr. [name]. The wife stated the dermatologist and the dermatologist's nurse were adamant that the patient not take Lamisil with Atorvastatin together and she was not comfortable with the patient taking it.  She also was concerned about the high dose of Lasix the patient now has to take. The patients wife ask if Dr. Francina Ames would talk to the dermatologist when he returns next week.  Will relay information to Dr. [name] and Pharm D Duanne Moron.  . Evaluation of visit to the dermatologist.  The patient had 8 pre-cancerous places removed from his arms, hands, temple and trunk. They have blistered over and the patient is putting Vaseline and Band-Aids on the area as instructed by the provider.  . Evaluation of visit to the [place].  The patients visit went well. The patient seems to be stable at this time. Will continue to monitor for changes.   CAD, HTN, HLD, and CVA :  . Patient is unable to independently self-manage chronic health conditions  Please see past updates related to this goal by clicking on the "Past Updates" button in the selected goal         Patient verbalizes understanding of instructions provided today.   The care management team will reach out to the patient again over the next 30 to 60 days.   Alto Denver RN, MSN, CCM Community Care Coordinator [place]  [facility] [place] Mobile: [phone]

## 2019-07-19 DIAGNOSIS — G4733 Obstructive sleep apnea (adult) (pediatric): Secondary | ICD-10-CM | POA: Diagnosis not present

## 2019-07-22 NOTE — Telephone Encounter (Signed)
Pharmacist reviewed and advised Dr Blythe Stanford on 07/13/19 with associated references (see note) - and contacted our office advising no reason to discontinue the patient's Atorvastatin while taking Lamisil. This is true and the patient may start Oral Lamisil 250 mg 1 po qd #30 - may send in and make appt for 1 month (if does not already have an appt). We typically do not check liver tests due to rare instance of liver problem with Lamisil as cost/benefit ratio is not beneficial.  This patient has no history of liver problems. Because there is rare liver inflammation with both Statins and oral antifungals (individually)  and report of "statin -associated rhabdomyolysis triggered by drug-drug interaction with itraconazole" (itraconazole being another oral antifungal - as is Lamisil), I typically try to avoid giving statins and oral antifungals together as a rule.   Nevertheless, there are no specific reported drug-drug interactions associated with Atorvastatin and Lamisil (Terbinafine) given together.

## 2019-07-24 DIAGNOSIS — Z1211 Encounter for screening for malignant neoplasm of colon: Secondary | ICD-10-CM | POA: Diagnosis not present

## 2019-07-24 NOTE — Telephone Encounter (Signed)
Called pt discussed okay to take Atorvastatin while taking Lamisil, pt report he was advised not to take the these 2 rx's together, I discussed with pt per Dr Blythe Stanford and Dr Kirtland Bouchard they are safe together, pt will call back here if he would like to start Lamisil

## 2019-07-25 LAB — COLOGUARD: Cologuard: NEGATIVE

## 2019-07-28 ENCOUNTER — Other Ambulatory Visit: Payer: Self-pay | Admitting: Family Medicine

## 2019-07-28 DIAGNOSIS — E039 Hypothyroidism, unspecified: Secondary | ICD-10-CM

## 2019-07-28 NOTE — Telephone Encounter (Signed)
Requested medication (s) are due for refill today: yes  Requested medication (s) are on the active medication list: yes  Last refill:  12/12/18  Future visit scheduled: no  Notes to clinic:  overdue for labwork   Requested Prescriptions  Pending Prescriptions Disp Refills   levothyroxine (SYNTHROID) 112 MCG tablet [Pharmacy Med Name: LEVOTHYROXINE 0.112MG  ( ) TABS] 90 tablet 1    Sig: TAKE 1 TABLET(112 MCG) BY MOUTH DAILY BEFORE BREAKFAST      Endocrinology:  Hypothyroid Agents Failed - 07/28/2019  1:17 PM      Failed - TSH needs to be rechecked within 3 months after an abnormal result. Refill until TSH is due.      Failed - TSH in normal range and within 360 days    TSH  Date Value Ref Range Status  07/24/2018 2.086 0.350 - 4.500 uIU/mL Final    Comment:    Performed by a 3rd Generation assay with a functional sensitivity of <=0.01 uIU/mL. Performed at [facility], 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

## 2019-08-01 LAB — COLOGUARD: COLOGUARD: NEGATIVE

## 2019-08-03 ENCOUNTER — Ambulatory Visit: Payer: Self-pay | Admitting: Pharmacist

## 2019-08-03 ENCOUNTER — Encounter: Payer: Self-pay | Admitting: Family Medicine

## 2019-08-03 DIAGNOSIS — I25810 Atherosclerosis of coronary artery bypass graft(s) without angina pectoris: Secondary | ICD-10-CM

## 2019-08-03 DIAGNOSIS — E782 Mixed hyperlipidemia: Secondary | ICD-10-CM

## 2019-08-03 DIAGNOSIS — I10 Essential (primary) hypertension: Secondary | ICD-10-CM | POA: Diagnosis not present

## 2019-08-03 NOTE — Patient Instructions (Signed)
Thank you allowing the Chronic Care Management Team to be a part of your care! It was a pleasure speaking with you today!     CCM (Chronic Care Management) Team    Alto Denver RN, MSN, CCM Nurse Care Coordinator  [phone]   Duanne Moron PharmD  Clinical Pharmacist  [phone]   Dickie La LCSW Clinical Social Worker [phone]  Visit Information  Goals Addressed            This Visit's Progress    PharmD - Medication Management       Current Barriers:   Chronic Disease Management support, education, and care coordination needs related to CAD, HTN, HLD, and CVA  Pharmacist Clinical Goal(s):   Over the next 30 days, patient will work with CM Pharmacist to address medication management needs  Interventions:  Perform chart review  Counsel on importance of blood pressure monitoring and control o Reports patient taking:  Atenolol 50 mg daily at bedtime  Furosemide 80 mg twice daily  Isosorbide ER 30 mg once daily  Spironolactone 25 mg - 1/2 tablet (12.5 mg) once daily o Reports last checked BP on:   7/12: 151/81, HR 73 - Reports checked right after patient came in from mowing; denies patient resting prior to taking reading  6/6: 132/72, HR 72 o Counsel on BP monitoring technique, particularly waiting at least 30 minutes after exercise to take reading o Again encourage checking blood pressure regularly as directed by providers, keeping log and bringing log with patient to his medical appointments  Again encourage to schedule f/u visit with Cardiology for patient. Provide Mrs. Harner with phone number  Follow up with patient/wife to address any remaining questions regarding Lamisil and safety of taking while on atorvastatin o Mrs. Dunlow reports that patient has decided against taking Lamisil and instead started taking a probiotic supplement, Fungus Clear, about 5-6 weeks ago and feels like it is working for him.  Caution patient about limited data  with supplements, as unlike prescription medications, these do not undergo FDA approval and there may be insufficient data to confirm contents, safety, efficacy or potential for interactions.  Patient Self Care Activities:   Attends all scheduled provider appointments  Calls pharmacy for medication refills  Calls provider office for new concerns or questions  Please see past updates related to this goal by clicking on the "Past Updates" button in the selected goal         Patient verbalizes understanding of instructions provided today.   The care management team will reach out to the patient again over the next 60 days.   Duanne Moron, PharmD, [facility] Clinical Pharmacist [facility] [employer] [phone]

## 2019-08-03 NOTE — Chronic Care Management (AMB) (Signed)
Chronic Care Management   Follow Up Note   08/03/2019 Name: Perle Gibbon MRN: [id number] DOB: 1940/10/29  Referred by: [name], DO Reason for referral : Chronic Care Management (Patient Phone Call)   Johnte Portnoy is a 79 y.o. year old male who is a primary care patient of [name], DO. The CCM team was consulted for assistance with chronic disease management and care coordination needs.    I reached out to [employer] and his wife by phone today.   Review of patient status, including review of consultants reports, relevant laboratory and other test results, and collaboration with appropriate care team members and the patient's provider was performed as part of comprehensive patient evaluation and provision of chronic care management services.     Outpatient Encounter Medications as of 08/03/2019  Medication Sig  . acetaminophen (TYLENOL 8 HOUR) 650 MG CR tablet Take 1,300 mg by mouth at bedtime.   Marland Kitchen aspirin 81 MG chewable tablet Chew 81 mg by mouth daily.  Marland Kitchen atenolol (TENORMIN) 50 MG tablet Take 1 tablet (50 mg total) by mouth at bedtime.  Marland Kitchen atorvastatin (LIPITOR) 80 MG tablet TAKE 1 TABLET BY MOUTH DAILY  . cetirizine (ZYRTEC) 10 MG tablet Take 10 mg by mouth at bedtime.   . Cholecalciferol (VITAMIN D) 50 MCG (2000 UT) tablet Take 4,000 Units by mouth daily.  . clopidogrel (PLAVIX) 75 MG tablet Take 1 tablet (75 mg total) by mouth daily.  . diclofenac sodium (VOLTAREN) 1 % GEL Apply 2 g topically 4 (four) times daily as needed for pain. Foot pain  . docusate sodium (COLACE) 100 MG capsule Take 200 mg by mouth daily.   . furosemide (LASIX) 80 MG tablet Take 1 tablet (80 mg total) by mouth 2 (two) times daily.  Marland Kitchen ipratropium (ATROVENT) 0.06 % nasal spray USE 2 SPRAYS IN EACH NOSTRIL THREE TIMES DAILY AS NEEDED FOR RHINITIS  . isosorbide mononitrate (IMDUR) 30 MG 24 hr tablet Take 1 tablet (30 mg total) by mouth daily.  Marland Kitchen levothyroxine (SYNTHROID) 112 MCG  tablet TAKE 1 TABLET(112 MCG) BY MOUTH DAILY BEFORE BREAKFAST  . loratadine (CLARITIN) 10 MG tablet Take 10 mg by mouth daily.  Marland Kitchen LORazepam (ATIVAN) 0.5 MG tablet Take 1 tablet (0.5 mg total) by mouth daily as needed for anxiety or sleep.  . metroNIDAZOLE (METROCREAM) 0.75 % cream Apply topically in the morning and at bedtime.  . Multiple Vitamin (MULTIVITAMIN) tablet Take 1 tablet by mouth daily.  . nitroGLYCERIN (NITROSTAT) 0.4 MG SL tablet Place 1 tablet (0.4 mg total) under the tongue every 5 (five) minutes as needed for chest pain.  Marland Kitchen PATADAY 0.1 % ophthalmic solution   . senna (SENOKOT) 8.6 MG tablet Take 1 tablet by mouth daily.  . sertraline (ZOLOFT) 100 MG tablet Take 1.5 tablets (150 mg total) by mouth daily.  Marland Kitchen spironolactone (ALDACTONE) 25 MG tablet Take 0.5 tablets (12.5 mg total) by mouth daily.   No facility-administered encounter medications on file as of 08/03/2019.    Goals Addressed            This Visit's Progress   . PharmD - Medication Management       Current Barriers:  . Chronic Disease Management support, education, and care coordination needs related to CAD, HTN, HLD, and CVA  Pharmacist Clinical Goal(s):  Marland Kitchen Over the next 30 days, patient will work with CM Pharmacist to address medication management needs  Interventions: . Perform chart review . Counsel on importance  of blood pressure monitoring and control o Reports patient taking:  Atenolol 50 mg daily at bedtime  Furosemide 80 mg twice daily  Isosorbide ER 30 mg once daily  Spironolactone 25 mg - 1/2 tablet (12.5 mg) once daily o Reports last checked BP on:   7/12: 151/81, HR 73 - Reports checked right after patient came in from mowing; denies patient resting prior to taking reading  6/6: 132/72, HR 72 o Counsel on BP monitoring technique, particularly waiting at least 30 minutes after exercise to take reading o Again encourage checking blood pressure regularly as directed by providers, keeping  log and bringing log with patient to his medical appointments . Again encourage to schedule f/u visit with Cardiology for patient. Provide Mrs. Lucus with phone number . Follow up with patient/wife to address any remaining questions regarding Lamisil and safety of taking while on atorvastatin o Mrs. Hairfield reports that patient has decided against taking Lamisil and instead started taking a probiotic supplement, Fungus Clear, about 5-6 weeks ago and feels like it is working for him.  Caution patient about limited data with supplements, as unlike prescription medications, these do not undergo FDA approval and there may be insufficient data to confirm contents, safety, efficacy or potential for interactions.  Patient Self Care Activities:  . Attends all scheduled provider appointments . Calls pharmacy for medication refills . Calls provider office for new concerns or questions  Please see past updates related to this goal by clicking on the "Past Updates" button in the selected goal         Plan  The care management team will reach out to the patient again over the next 60 days.   Duanne Moron, PharmD, [facility] Clinical Pharmacist [facility] [employer] [phone]

## 2019-08-16 ENCOUNTER — Other Ambulatory Visit: Payer: Self-pay

## 2019-08-16 ENCOUNTER — Encounter: Payer: Self-pay | Admitting: Podiatry

## 2019-08-16 ENCOUNTER — Ambulatory Visit (INDEPENDENT_AMBULATORY_CARE_PROVIDER_SITE_OTHER): Payer: Medicare PPO | Admitting: Podiatry

## 2019-08-16 DIAGNOSIS — B351 Tinea unguium: Secondary | ICD-10-CM

## 2019-08-16 DIAGNOSIS — M79676 Pain in unspecified toe(s): Secondary | ICD-10-CM | POA: Diagnosis not present

## 2019-08-16 NOTE — Progress Notes (Signed)
This patient returns to my office for at risk foot care.  This patient requires this care by a professional since this patient will be at risk due to having chronic kidney disease, coagulation defect  and CVA.  Patient is taking plavix.    This patient is unable to cut nails himself since the patient cannot reach his nails.These nails are painful walking and wearing shoes.  This patient presents for at risk foot care today.  General Appearance  Alert, conversant and in no acute stress.  Vascular  Dorsalis pedis and posterior tibial  pulses are palpable  bilaterally.  Capillary return is within normal limits  bilaterally. Temperature is within normal limits  bilaterally.  Neurologic  Senn-Weinstein monofilament wire test within normal limits  bilaterally. Muscle power within normal limits bilaterally.  Nails Thick disfigured discolored nails with subungual debris  from hallux to fifth toes bilaterally. No evidence of bacterial infection or drainage bilaterally.  Orthopedic  No limitations of motion  feet .  No crepitus or effusions noted.  No bony pathology or digital deformities noted. 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  Skin  normotropic skin with no porokeratosis noted bilaterally.  No signs of infections or ulcers noted.   Pinch callus right hallux.  Onychomycosis  Pain in right toes  Pain in left toes  Consent was obtained for treatment procedures.   Mechanical debridement of nails 1-5  bilaterally performed with a nail nipper.  Filed with dremel without incident.    Return office visit    3 months                  Told patient to return for periodic foot care and evaluation due to potential at risk complications.   Helane Gunther DPM

## 2019-08-17 LAB — CUP PACEART REMOTE DEVICE CHECK
Date Time Interrogation Session: 20210803231608
Implantable Pulse Generator Implant Date: 20200714

## 2019-08-20 ENCOUNTER — Ambulatory Visit (INDEPENDENT_AMBULATORY_CARE_PROVIDER_SITE_OTHER): Payer: Medicare PPO | Admitting: *Deleted

## 2019-08-20 DIAGNOSIS — G459 Transient cerebral ischemic attack, unspecified: Secondary | ICD-10-CM

## 2019-08-20 NOTE — Progress Notes (Signed)
Carelink Summary Report / Loop Recorder 

## 2019-08-22 ENCOUNTER — Other Ambulatory Visit: Payer: Self-pay | Admitting: Family Medicine

## 2019-08-22 DIAGNOSIS — F419 Anxiety disorder, unspecified: Secondary | ICD-10-CM

## 2019-08-22 NOTE — Telephone Encounter (Signed)
Requested Prescriptions  Pending Prescriptions Disp Refills  . sertraline (ZOLOFT) 100 MG tablet [Pharmacy Med Name: SERTRALINE 100MG  TABLETS] 135 tablet 0    Sig: TAKE 1 AND 1/2 TABLETS(150 MG) BY MOUTH DAILY     Psychiatry:  Antidepressants - SSRI Passed - 08/22/2019  6:17 AM      Passed - Completed PHQ-2 or PHQ-9 in the last 360 days.      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

## 2019-08-27 ENCOUNTER — Telehealth: Payer: Medicare PPO | Admitting: General Practice

## 2019-08-27 ENCOUNTER — Ambulatory Visit (INDEPENDENT_AMBULATORY_CARE_PROVIDER_SITE_OTHER): Payer: Medicare PPO | Admitting: General Practice

## 2019-08-27 DIAGNOSIS — I25810 Atherosclerosis of coronary artery bypass graft(s) without angina pectoris: Secondary | ICD-10-CM | POA: Diagnosis not present

## 2019-08-27 DIAGNOSIS — I10 Essential (primary) hypertension: Secondary | ICD-10-CM | POA: Diagnosis not present

## 2019-08-27 DIAGNOSIS — E782 Mixed hyperlipidemia: Secondary | ICD-10-CM

## 2019-08-27 DIAGNOSIS — I6930 Unspecified sequelae of cerebral infarction: Secondary | ICD-10-CM

## 2019-08-27 DIAGNOSIS — G4733 Obstructive sleep apnea (adult) (pediatric): Secondary | ICD-10-CM

## 2019-08-27 NOTE — Chronic Care Management (AMB) (Signed)
Chronic Care Management   Follow Up Note   08/27/2019 Name: Luke Mckee MRN: [id number] DOB: 25-Apr-1940  Referred by: [name], DO Reason for referral : Chronic Care Management (RNCM Follow up call: Chronic Disease Management and Care Coordiation Needs )   Luke Mckee is a 79 y.o. year old male who is a primary care patient of [name], DO. The CCM team was consulted for assistance with chronic disease management and care coordination needs.    Review of patient status, including review of consultants reports, relevant laboratory and other test results, and collaboration with appropriate care team members and the patient's provider was performed as part of comprehensive patient evaluation and provision of chronic care management services.    SDOH (Social Determinants of Health) assessments performed: Yes See Care Plan activities for detailed interventions related to [facility])     Outpatient Encounter Medications as of 08/27/2019  Medication Sig  . acetaminophen (TYLENOL 8 HOUR) 650 MG CR tablet Take 1,300 mg by mouth at bedtime.   Marland Kitchen aspirin 81 MG chewable tablet Chew 81 mg by mouth daily.  Marland Kitchen atenolol (TENORMIN) 50 MG tablet Take 1 tablet (50 mg total) by mouth at bedtime.  Marland Kitchen atorvastatin (LIPITOR) 80 MG tablet TAKE 1 TABLET BY MOUTH DAILY  . cetirizine (ZYRTEC) 10 MG tablet Take 10 mg by mouth at bedtime.   . Cholecalciferol (VITAMIN D) 50 MCG (2000 UT) tablet Take 4,000 Units by mouth daily.  . clopidogrel (PLAVIX) 75 MG tablet Take 1 tablet (75 mg total) by mouth daily.  . diclofenac sodium (VOLTAREN) 1 % GEL Apply 2 g topically 4 (four) times daily as needed for pain. Foot pain  . docusate sodium (COLACE) 100 MG capsule Take 200 mg by mouth daily.   . furosemide (LASIX) 80 MG tablet Take 1 tablet (80 mg total) by mouth 2 (two) times daily.  Marland Kitchen ipratropium (ATROVENT) 0.06 % nasal spray USE 2 SPRAYS IN EACH NOSTRIL THREE TIMES DAILY AS NEEDED FOR RHINITIS    . isosorbide mononitrate (IMDUR) 30 MG 24 hr tablet Take 1 tablet (30 mg total) by mouth daily.  Marland Kitchen levothyroxine (SYNTHROID) 112 MCG tablet TAKE 1 TABLET(112 MCG) BY MOUTH DAILY BEFORE BREAKFAST  . loratadine (CLARITIN) 10 MG tablet Take 10 mg by mouth daily.  Marland Kitchen LORazepam (ATIVAN) 0.5 MG tablet Take 1 tablet (0.5 mg total) by mouth daily as needed for anxiety or sleep.  . metroNIDAZOLE (METROCREAM) 0.75 % cream Apply topically in the morning and at bedtime.  . Multiple Vitamin (MULTIVITAMIN) tablet Take 1 tablet by mouth daily.  . nitroGLYCERIN (NITROSTAT) 0.4 MG SL tablet Place 1 tablet (0.4 mg total) under the tongue every 5 (five) minutes as needed for chest pain.  Marland Kitchen PATADAY 0.1 % ophthalmic solution   . senna (SENOKOT) 8.6 MG tablet Take 1 tablet by mouth daily.  . sertraline (ZOLOFT) 100 MG tablet TAKE 1 AND 1/2 TABLETS(150 MG) BY MOUTH DAILY  . spironolactone (ALDACTONE) 25 MG tablet Take 0.5 tablets (12.5 mg total) by mouth daily.   No facility-administered encounter medications on file as of 08/27/2019.     Objective:  BP Readings from Last 3 Encounters:  07/17/19 132/72  05/30/19 110/70  04/23/19 109/73    Goals Addressed              This Visit's Progress   .  RNCM- Pt's wife: "They want [name] to do another sleep apnea test" (pt-stated)        CARE  PLAN ENTRY (see longitudinal plan of care for additional care plan information)  Current Barriers:  Marland Kitchen. Knowledge Deficits related to the need for a new sleep apnea study in the home setting and concerns with the patient not being able to use his cpap machine while doing the study.  . Care Coordination needs related to sleep apnea in a patient with obstructive sleep apnea (disease states) . Chronic Disease Management support and education needs related to obstructive sleep apnea . Spouse concern over in home study for sleep apnea and request the patient have the study done in the clinical setting  Nurse Case Manager Clinical  Goal(s):  Marland Kitchen. Over the next 120 days, patient will verbalize understanding of plan for obtaining needed information for adequate therapy and treatment related to obstructive sleep apnea.  . Over the next 120 days, [facility] to address needs related to the need for a new sleep apnea test.  . Over the next 120 days, patient will attend all scheduled medical appointments: next appointment with pcp 10-17-2019 but may need an earlier appointment  Interventions:  . Inter-disciplinary care team collaboration (see longitudinal plan of care) . Evaluation of current treatment plan related to sleep apnea and patient's adherence to plan as established by provider. . Advised patient to continue current treatment regimen at this time . Provided education to patient re: working with the pcp to decide the best course of action to take in meeting needs related to obstructive sleep apnea.  08-27-2019: Advised the patient and patients wife to discuss the sleep study with the pulmonary provider. Per the patients wife [facility]  Marland Kitchen. Collaborated with pcp regarding the spouses concern over having the sleep study at home without use of his current cpap machine versus having study done in the clinical setting with health professionals available.  07-18-2019: Spoke to the patients wife. She does not know who told them to do this and could not find the paperwork. Education provided that the team would need to know why there needed to be a new sleep study and that Dr. Althea CharonKaramalegos would be willing to talk to the referring party to see what could be arranged. The RNCM will do research as time permits to see what information about referral and needs of the patient are.  Will monitor and communicate findings as appropriate. 08-27-2019: After discussion and collaboration with Dr. Althea CharonKaramalegos, advised the patient and the patients wife to discuss  the sleep study with the pulmonary provider as they are the ones who did the referral. The wife feels this is redundant as they have been compliant with the treatment plan and do not understand why the patient needs a new study. Education and support given.  . Discussed plans with patient for ongoing care management follow up and provided patient with direct contact information for care management team  Patient Self Care Activities:  . Patient verbalizes understanding of plan to work with pcp and CCM team to meet needs related to obstructive sleep apnea . Attends all scheduled provider appointments . Calls provider office for new concerns or questions . Unable to independently do sleep study without the use of cpap due to obstructive sleep apnea  Please see past updates related to this goal by clicking on the "Past Updates" button in the selected goal      .  RNCM: I have not started exercising yet  CARE PLAN ENTRY (see longtitudinal plan of care for additional care plan information)  Current Barriers:  . Chronic Disease Management support, education, and care coordination needs related to CAD, HTN, HLD, and CVA  Clinical Goal(s) related to CAD, HTN, HLD, and CVA :  Over the next 90 days, patient will:  . Work with the care management team to address educational, disease management, and care coordination needs  . Begin or continue self health monitoring activities as directed today Measure and record blood pressure 4 times per week and start exercise plan per recommendations of neurologist with goal of 15 mins per day . Call provider office for new or worsened signs and symptoms Blood pressure findings outside established parameters, Shortness of breath, and New or worsened symptom related to HLD and CVA . Call care management team with questions or concerns . Verbalize basic understanding of patient centered plan of care established today  Interventions related to CAD, HTN, HLD, and  CVA :  . Evaluation of current treatment plans and patient's adherence to plan as established by provider . Education on writing blood pressure readings down so written log could be taken to the provider office on visits.  The patients wife is writing the patients blood pressures down. [name].  Per the wife the patient was very active that day. [name].  Oxygen sats are 92-96%.  The patient remains stable with blood pressures and heart rate. Oxygen sats are stable as well. 08-27-2019: Patient is doing well. No new issues related to chronic conditions at this time.  . Assessed patient understanding of disease states.  Good understanding of chronic diseases and how to effectively manage . Assessed patient's education and care coordination needs. 08-27-2019: Review of needs. The patients wife states the patient is doing well.  The patient is using a herbal medication instead of Lamisil and this is working well for him at this time. Denies any issues or concerns.  . Assessed patient's exercise patterns. The patient has started an exercise regimen, he and his wife purchased a [place] and he is doing 2000 steps daily. This takes him approximately 30 minutes to do.  He feels this is also helping his balance.  The neurologist recommended last week the patient start exercising and do 15 minutes per day. Per the wife he does take some days off but he is still continuing to use regularly. The patient continues to use the [place] and is doing well.  . Provided disease specific education to patient. Education and review. Endorses compliance . Collaborated with appropriate clinical care team members regarding patient needs . Reviewed upcoming appointments: pcp appointment on 10-17-2019 at 2 pm . Evaluation of current treatment related to the nail fungus the patient was experiencing and the  use of Lamisil. The patient verbalized he is using a herbal medication his wife got from him and they decided against the Lamisil. The herbal medication is working for the patient and he has not new concerns at this time. The patient and patients wife have also discussed this with the clinical pharmacist.  . Evaluation of visit to the dermatologist. The patient had 8 pre-cancerous places removed from his arms, hands, temple and trunk. They have blistered over and the patient is putting Vaseline and Band-Aids on the area as instructed by the provider.  . Evaluation of visit to  the [place].  The patients visit went well. The patient seems to be stable at this time. Will continue to monitor for changes.  . Evaluation of upcoming appointments: Has a follow up with the pcp on 10-17-2019 at 2 pm. Per the patients wife he sees the pulmonary provider on 08-28-2019.   CAD, HTN, HLD, and CVA :  . Patient is unable to independently self-manage chronic health conditions  Please see past updates related to this goal by clicking on the "Past Updates" button in the selected goal          Plan:   Telephone follow up appointment with care management team member scheduled for: 11-05-2019 at 1 pm   Alto Denver RN, MSN, CCM Community Care Coordinator [facility]  [facility] [place] Mobile: [phone]

## 2019-08-27 NOTE — Patient Instructions (Signed)
Visit Information  Goals Addressed              This Visit's Progress   .  RNCM- Pt's wife: "They want [name] to do another sleep apnea test" (pt-stated)        CARE PLAN ENTRY (see longitudinal plan of care for additional care plan information)  Current Barriers:  Marland Kitchen Knowledge Deficits related to the need for a new sleep apnea study in the home setting and concerns with the patient not being able to use his cpap machine while doing the study.  . Care Coordination needs related to sleep apnea in a patient with obstructive sleep apnea (disease states) . Chronic Disease Management support and education needs related to obstructive sleep apnea . Spouse concern over in home study for sleep apnea and request the patient have the study done in the clinical setting  Nurse Case Manager Clinical Goal(s):  Marland Kitchen Over the next 120 days, patient will verbalize understanding of plan for obtaining needed information for adequate therapy and treatment related to obstructive sleep apnea.  . Over the next 120 days, patient will work with [facility] and pcp to address needs related to the need for a new sleep apnea test.  . Over the next 120 days, patient will attend all scheduled medical appointments: next appointment with pcp 10-17-2019 but may need an earlier appointment  Interventions:  . Inter-disciplinary care team collaboration (see longitudinal plan of care) . Evaluation of current treatment plan related to sleep apnea and patient's adherence to plan as established by provider. . Advised patient to continue current treatment regimen at this time . Provided education to patient re: working with the pcp to decide the best course of action to take in meeting needs related to obstructive sleep apnea.  08-27-2019: Advised the patient and patients wife to discuss the sleep study with the pulmonary provider. Per the patients wife the patient has an appointment tomorrow at [facility] with the pulmonary provider.    Marland Kitchen Collaborated with pcp regarding the spouses concern over having the sleep study at home without use of his current cpap machine versus having study done in the clinical setting with health professionals available.  07-18-2019: Spoke to the patients wife. She does not know who told them to do this and could not find the paperwork. Education provided that the team would need to know why there needed to be a new sleep study and that Dr. [name] would be willing to talk to the referring party to see what could be arranged. The RNCM will do research as time permits to see what information about referral and needs of the patient are.  Will monitor and communicate findings as appropriate. 08-27-2019: After discussion and collaboration with Dr. [name], advised the patient and the patients wife to discuss the sleep study with the pulmonary provider as they are the ones who did the referral. The wife feels this is redundant as they have been compliant with the treatment plan and do not understand why the patient needs a new study. Education and support given.  . Discussed plans with patient for ongoing care management follow up and provided patient with direct contact information for care management team  Patient Self Care Activities:  . Patient verbalizes understanding of plan to work with pcp and CCM team to meet needs related to obstructive sleep apnea . Attends all scheduled provider appointments . Calls provider office for new concerns or questions . Unable to independently do sleep study without the  use of cpap due to obstructive sleep apnea  Please see past updates related to this goal by clicking on the "Past Updates" button in the selected goal      .  RNCM: I have not started exercising yet        CARE PLAN ENTRY (see longtitudinal plan of care for additional care plan information)  Current Barriers:  . Chronic Disease Management support, education, and care coordination needs related to  CAD, HTN, HLD, and CVA  Clinical Goal(s) related to CAD, HTN, HLD, and CVA :  Over the next 90 days, patient will:  . Work with the care management team to address educational, disease management, and care coordination needs  . Begin or continue self health monitoring activities as directed today Measure and record blood pressure 4 times per week and start exercise plan per recommendations of neurologist with goal of 15 mins per day . Call provider office for new or worsened signs and symptoms Blood pressure findings outside established parameters, Shortness of breath, and New or worsened symptom related to HLD and CVA . Call care management team with questions or concerns . Verbalize basic understanding of patient centered plan of care established today  Interventions related to CAD, HTN, HLD, and CVA :  . Evaluation of current treatment plans and patient's adherence to plan as established by provider . Education on writing blood pressure readings down so written log could be taken to the provider office on visits.  The patients wife is writing the patients blood pressures down. [name].  Per the wife the patient was very active that day. [name].  Oxygen sats are 92-96%.  The patient remains stable with blood pressures and heart rate. Oxygen sats are stable as well. 08-27-2019: Patient is doing well. No new issues related to chronic conditions at this time.  . Assessed patient understanding of disease states.  Good understanding of chronic diseases and how to effectively manage . Assessed patient's education and care coordination needs. 08-27-2019: Review of needs. The patients wife states the patient is doing well.  The patient is using a herbal medication instead of Lamisil and this is working well for him at this time. Denies any issues or concerns.  . Assessed  patient's exercise patterns. The patient has started an exercise regimen, he and his wife purchased a [place] and he is doing 2000 steps daily. This takes him approximately 30 minutes to do.  He feels this is also helping his balance.  The neurologist recommended last week the patient start exercising and do 15 minutes per day. Per the wife he does take some days off but he is still continuing to use regularly. The patient continues to use the [place] and is doing well.  . Provided disease specific education to patient. Education and review. Endorses compliance . Collaborated with appropriate clinical care team members regarding patient needs . Reviewed upcoming appointments: pcp appointment on 10-17-2019 at 2 pm . Evaluation of current treatment related to the nail fungus the patient was experiencing and the use of Lamisil. The patient verbalized he is using a herbal medication his wife got from him and they decided against the Lamisil. The herbal medication is working for the patient and he has not new concerns at this time. The patient and patients wife have also discussed this with the clinical  pharmacist.  . Evaluation of visit to the dermatologist. The patient had 8 pre-cancerous places removed from his arms, hands, temple and trunk. They have blistered over and the patient is putting Vaseline and Band-Aids on the area as instructed by the provider.  . Evaluation of visit to the [place].  The patients visit went well. The patient seems to be stable at this time. Will continue to monitor for changes.  . Evaluation of upcoming appointments: Has a follow up with the pcp on 10-17-2019 at 2 pm. Per the patients wife he sees the pulmonary provider on 08-28-2019.   CAD, HTN, HLD, and CVA :  . Patient is unable to independently self-manage chronic health conditions  Please see past updates related to this goal by clicking on the "Past Updates" button in the selected goal         Patient verbalizes understanding  of instructions provided today.   Telephone follow up appointment with care management team member scheduled for: 11-05-2019 at 1 pm  Alto Denver RN, MSN, CCM Community Care Coordinator [facility]  [facility] [place] Mobile: [phone]

## 2019-08-28 DIAGNOSIS — Z8673 Personal history of transient ischemic attack (TIA), and cerebral infarction without residual deficits: Secondary | ICD-10-CM | POA: Diagnosis not present

## 2019-09-13 ENCOUNTER — Other Ambulatory Visit: Payer: Self-pay | Admitting: Family Medicine

## 2019-09-13 DIAGNOSIS — I25810 Atherosclerosis of coronary artery bypass graft(s) without angina pectoris: Secondary | ICD-10-CM

## 2019-09-14 ENCOUNTER — Ambulatory Visit (INDEPENDENT_AMBULATORY_CARE_PROVIDER_SITE_OTHER): Payer: Medicare PPO | Admitting: General Practice

## 2019-09-14 DIAGNOSIS — E782 Mixed hyperlipidemia: Secondary | ICD-10-CM | POA: Diagnosis not present

## 2019-09-14 DIAGNOSIS — I25810 Atherosclerosis of coronary artery bypass graft(s) without angina pectoris: Secondary | ICD-10-CM | POA: Diagnosis not present

## 2019-09-14 DIAGNOSIS — I10 Essential (primary) hypertension: Secondary | ICD-10-CM

## 2019-09-14 NOTE — Chronic Care Management (AMB) (Signed)
Chronic Care Management   Follow Up Note   09/14/2019 Name: Luke Mckee MRN: [id number] DOB: 10/01/1940  Referred by: [name], DO Reason for referral : Chronic Care Management (VM x 2 from the patients wife requesting help with script for Imdur)   Luke Mckee is a 79 y.o. year old male who is a primary care patient of [name], DO. The CCM team was consulted for assistance with chronic disease management and care coordination needs.    Review of patient status, including review of consultants reports, relevant laboratory and other test results, and collaboration with appropriate care team members and the patient's provider was performed as part of comprehensive patient evaluation and provision of chronic care management services.    SDOH (Social Determinants of Health) assessments performed: Yes See Care Plan activities for detailed interventions related to [facility])     Outpatient Encounter Medications as of 09/14/2019  Medication Sig  . acetaminophen (TYLENOL 8 HOUR) 650 MG CR tablet Take 1,300 mg by mouth at bedtime.   Marland Kitchen aspirin 81 MG chewable tablet Chew 81 mg by mouth daily.  Marland Kitchen atenolol (TENORMIN) 50 MG tablet Take 1 tablet (50 mg total) by mouth at bedtime.  Marland Kitchen atorvastatin (LIPITOR) 80 MG tablet TAKE 1 TABLET BY MOUTH DAILY  . cetirizine (ZYRTEC) 10 MG tablet Take 10 mg by mouth at bedtime.   . Cholecalciferol (VITAMIN D) 50 MCG (2000 UT) tablet Take 4,000 Units by mouth daily.  . clopidogrel (PLAVIX) 75 MG tablet Take 1 tablet (75 mg total) by mouth daily.  . diclofenac sodium (VOLTAREN) 1 % GEL Apply 2 g topically 4 (four) times daily as needed for pain. Foot pain  . docusate sodium (COLACE) 100 MG capsule Take 200 mg by mouth daily.   . furosemide (LASIX) 80 MG tablet Take 1 tablet (80 mg total) by mouth 2 (two) times daily.  Marland Kitchen ipratropium (ATROVENT) 0.06 % nasal spray USE 2 SPRAYS IN EACH NOSTRIL THREE TIMES DAILY AS NEEDED FOR RHINITIS  .  isosorbide mononitrate (IMDUR) 30 MG 24 hr tablet TAKE 1 TABLET(30 MG) BY MOUTH DAILY  . levothyroxine (SYNTHROID) 112 MCG tablet TAKE 1 TABLET(112 MCG) BY MOUTH DAILY BEFORE BREAKFAST  . loratadine (CLARITIN) 10 MG tablet Take 10 mg by mouth daily.  Marland Kitchen LORazepam (ATIVAN) 0.5 MG tablet Take 1 tablet (0.5 mg total) by mouth daily as needed for anxiety or sleep.  . metroNIDAZOLE (METROCREAM) 0.75 % cream Apply topically in the morning and at bedtime.  . Multiple Vitamin (MULTIVITAMIN) tablet Take 1 tablet by mouth daily.  . nitroGLYCERIN (NITROSTAT) 0.4 MG SL tablet Place 1 tablet (0.4 mg total) under the tongue every 5 (five) minutes as needed for chest pain.  Marland Kitchen PATADAY 0.1 % ophthalmic solution   . senna (SENOKOT) 8.6 MG tablet Take 1 tablet by mouth daily.  . sertraline (ZOLOFT) 100 MG tablet TAKE 1 AND 1/2 TABLETS(150 MG) BY MOUTH DAILY  . spironolactone (ALDACTONE) 25 MG tablet Take 0.5 tablets (12.5 mg total) by mouth daily.   No facility-administered encounter medications on file as of 09/14/2019.     Objective:   Goals Addressed            This Visit's Progress   . RNCM: I have not started exercising yet       CARE PLAN ENTRY (see longtitudinal plan of care for additional care plan information)  Current Barriers:  . Chronic Disease Management support, education, and care coordination needs related to CAD,  HTN, HLD, and CVA  Clinical Goal(s) related to CAD, HTN, HLD, and CVA :  Over the next 90 days, patient will:  . Work with the care management team to address educational, disease management, and care coordination needs  . Begin or continue self health monitoring activities as directed today Measure and record blood pressure 4 times per week and start exercise plan per recommendations of neurologist with goal of 15 mins per day . Call provider office for new or worsened signs and symptoms Blood pressure findings outside established parameters, Shortness of breath, and New or  worsened symptom related to HLD and CVA . Call care management team with questions or concerns . Verbalize basic understanding of patient centered plan of care established today  Interventions related to CAD, HTN, HLD, and CVA :  . Evaluation of current treatment plans and patient's adherence to plan as established by provider . Education on writing blood pressure readings down so written log could be taken to the provider office on visits.  The patients wife is writing the patients blood pressures down. [name].  Per the wife the patient was very active that day. [name].  Oxygen sats are 92-96%.  The patient remains stable with blood pressures and heart rate. Oxygen sats are stable as well. 08-27-2019: Patient is doing well. No new issues related to chronic conditions at this time.  . Assessed patient understanding of disease states.  Good understanding of chronic diseases and how to effectively manage . Assessed patient's education and care coordination needs. 08-27-2019: Review of needs. The patients wife states the patient is doing well.  The patient is using a herbal medication instead of Lamisil and this is working well for him at this time. Denies any issues or concerns.  . Assessed patient's exercise patterns. The patient has started an exercise regimen, he and his wife purchased a [place] and he is doing 2000 steps daily. This takes him approximately 30 minutes to do.  He feels this is also helping his balance.  The neurologist recommended last week the patient start exercising and do 15 minutes per day. Per the wife he does take some days off but he is still continuing to use regularly. The patient continues to use the [place] and is doing well.  . Provided disease specific education to patient. Education and review. Endorses compliance . Collaborated with  appropriate clinical care team members regarding patient needs . Reviewed upcoming appointments: pcp appointment on 10-17-2019 at 2 pm . Evaluation of current treatment related to the nail fungus the patient was experiencing and the use of Lamisil. The patient verbalized he is using a herbal medication his wife got from him and they decided against the Lamisil. The herbal medication is working for the patient and he has not new concerns at this time. The patient and patients wife have also discussed this with the clinical pharmacist.  . Evaluation of visit to the dermatologist. The patient had 8 pre-cancerous places removed from his arms, hands, temple and trunk. They have blistered over and the patient is putting Vaseline and Band-Aids on the area as instructed by the provider.  . Evaluation of visit to the [place].  The patients visit went well. The patient seems to be stable at this time. Will continue to monitor for changes.  . Evaluation of upcoming appointments: Has  a follow up with the pcp on 10-17-2019 at 2 pm. Per the patients wife he sees the pulmonary provider on 08-28-2019.  Marland Kitchen Evaluation of Imdur refill. The patients wife had called and left messages x 2 to inquire about the patients Imdur refill. The refill pool took care of the Imdur refill on 09-13-2019. RNCM made a call to the pharmacy- [facility] in [place] at: [phone] and confirmed that the script for Imdur had been received and filled. They also have his Lasix refill ready for pick up. The pharmacy did acknowledge that their phone systems and computer systems had been down. RMCM called the wife's cell phone back and the patient answered. Instructed the patient that his Imdur and Lasix were ready for pick up at the pharmacy. The patient verbalized understanding. Review of any other needs. The patient denies any further needs at this time.   CAD, HTN, HLD, and CVA :  . Patient is unable to independently self-manage chronic health  conditions  Please see past updates related to this goal by clicking on the "Past Updates" button in the selected goal          Plan:   The care management team will reach out to the patient again over the next 30 to 60 days.    Alto Denver RN, MSN, CCM Community Care Coordinator [place]  [facility] [place] Mobile: [phone]

## 2019-09-14 NOTE — Patient Instructions (Signed)
Visit Information  Goals Addressed            This Visit's Progress   . RNCM: I have not started exercising yet       CARE PLAN ENTRY (see longtitudinal plan of care for additional care plan information)  Current Barriers:  . Chronic Disease Management support, education, and care coordination needs related to CAD, HTN, HLD, and CVA  Clinical Goal(s) related to CAD, HTN, HLD, and CVA :  Over the next 90 days, patient will:  . Work with the care management team to address educational, disease management, and care coordination needs  . Begin or continue self health monitoring activities as directed today Measure and record blood pressure 4 times per week and start exercise plan per recommendations of neurologist with goal of 15 mins per day . Call provider office for new or worsened signs and symptoms Blood pressure findings outside established parameters, Shortness of breath, and New or worsened symptom related to HLD and CVA . Call care management team with questions or concerns . Verbalize basic understanding of patient centered plan of care established today  Interventions related to CAD, HTN, HLD, and CVA :  . Evaluation of current treatment plans and patient's adherence to plan as established by provider . Education on writing blood pressure readings down so written log could be taken to the provider office on visits.  The patients wife is writing the patients blood pressures down. [name].  Per the wife the patient was very active that day. [name].  Oxygen sats are 92-96%.  The patient remains stable with blood pressures and heart rate. Oxygen sats are stable as well. 08-27-2019: Patient is doing well. No new issues related to chronic conditions at this time.  . Assessed patient understanding of disease states.  Good understanding of chronic diseases  and how to effectively manage . Assessed patient's education and care coordination needs. 08-27-2019: Review of needs. The patients wife states the patient is doing well.  The patient is using a herbal medication instead of Lamisil and this is working well for him at this time. Denies any issues or concerns.  . Assessed patient's exercise patterns. The patient has started an exercise regimen, he and his wife purchased a [place] and he is doing 2000 steps daily. This takes him approximately 30 minutes to do.  He feels this is also helping his balance.  The neurologist recommended last week the patient start exercising and do 15 minutes per day. Per the wife he does take some days off but he is still continuing to use regularly. The patient continues to use the [place] and is doing well.  . Provided disease specific education to patient. Education and review. Endorses compliance . Collaborated with appropriate clinical care team members regarding patient needs . Reviewed upcoming appointments: pcp appointment on 10-17-2019 at 2 pm . Evaluation of current treatment related to the nail fungus the patient was experiencing and the use of Lamisil. The patient verbalized he is using a herbal medication his wife got from him and they decided against the Lamisil. The herbal medication is working for the patient and he has not new concerns at this time. The patient and patients wife have also discussed this with the clinical pharmacist.  . Evaluation of visit to the dermatologist. The patient had 8 pre-cancerous places  removed from his arms, hands, temple and trunk. They have blistered over and the patient is putting Vaseline and Band-Aids on the area as instructed by the provider.  . Evaluation of visit to the [place].  The patients visit went well. The patient seems to be stable at this time. Will continue to monitor for changes.  . Evaluation of upcoming appointments: Has a follow up with the pcp on 10-17-2019 at 2 pm. Per the  patients wife he sees the pulmonary provider on 08-28-2019.  Marland Kitchen Evaluation of Imdur refill. The patients wife had called and left messages x 2 to inquire about the patients Imdur refill. The refill pool took care of the Imdur refill on 09-13-2019. RNCM made a call to the pharmacy- [facility] in [place] at: [phone] and confirmed that the script for Imdur had been received and filled. They also have his Lasix refill ready for pick up. The pharmacy did acknowledge that their phone systems and computer systems had been down. RMCM called the wife's cell phone back and the patient answered. Instructed the patient that his Imdur and Lasix were ready for pick up at the pharmacy. The patient verbalized understanding. Review of any other needs. The patient denies any further needs at this time.   CAD, HTN, HLD, and CVA :  . Patient is unable to independently self-manage chronic health conditions  Please see past updates related to this goal by clicking on the "Past Updates" button in the selected goal         Patient verbalizes understanding of instructions provided today.   The care management team will reach out to the patient again over the next 30 to 60 days.   Alto Denver RN, MSN, CCM Community Care Coordinator [place]  [facility] [place] Mobile: [phone]

## 2019-09-24 ENCOUNTER — Ambulatory Visit (INDEPENDENT_AMBULATORY_CARE_PROVIDER_SITE_OTHER): Payer: Medicare PPO | Admitting: *Deleted

## 2019-09-24 DIAGNOSIS — I63412 Cerebral infarction due to embolism of left middle cerebral artery: Secondary | ICD-10-CM | POA: Diagnosis not present

## 2019-09-24 LAB — CUP PACEART REMOTE DEVICE CHECK
Date Time Interrogation Session: 20210905233532
Implantable Pulse Generator Implant Date: 20200714

## 2019-09-26 NOTE — Progress Notes (Signed)
Carelink Summary Report / Loop Recorder 

## 2019-10-01 ENCOUNTER — Ambulatory Visit: Payer: Self-pay | Admitting: Pharmacist

## 2019-10-01 DIAGNOSIS — I25810 Atherosclerosis of coronary artery bypass graft(s) without angina pectoris: Secondary | ICD-10-CM

## 2019-10-01 DIAGNOSIS — I10 Essential (primary) hypertension: Secondary | ICD-10-CM

## 2019-10-01 DIAGNOSIS — E782 Mixed hyperlipidemia: Secondary | ICD-10-CM | POA: Diagnosis not present

## 2019-10-01 NOTE — Chronic Care Management (AMB) (Signed)
Chronic Care Management   Follow Up Note   10/01/2019 Name: Luke Mckee MRN: [id number] DOB: May 27, 1940  Referred by: [name], DO Reason for referral : Chronic Care Management (Patient Phone Call)   Bearett Porcaro is a 79 y.o. year old male who is a primary care patient of [name], DO. The CCM team was consulted for assistance with chronic disease management and care coordination needs.    I reached out to [employer] and his wife by phone today.   Review of patient status, including review of consultants reports, relevant laboratory and other test results, and collaboration with appropriate care team members and the patient's provider was performed as part of comprehensive patient evaluation and provision of chronic care management services.    SDOH (Social Determinants of Health) assessments performed: Yes See Care Plan activities for detailed interventions related to [facility])     Outpatient Encounter Medications as of 10/01/2019  Medication Sig  . aspirin 81 MG chewable tablet Chew 81 mg by mouth daily.  Marland Kitchen atenolol (TENORMIN) 50 MG tablet Take 1 tablet (50 mg total) by mouth at bedtime.  Marland Kitchen atorvastatin (LIPITOR) 80 MG tablet TAKE 1 TABLET BY MOUTH DAILY  . clopidogrel (PLAVIX) 75 MG tablet Take 1 tablet (75 mg total) by mouth daily.  . furosemide (LASIX) 80 MG tablet Take 1 tablet (80 mg total) by mouth 2 (two) times daily.  . isosorbide mononitrate (IMDUR) 30 MG 24 hr tablet TAKE 1 TABLET(30 MG) BY MOUTH DAILY  . levothyroxine (SYNTHROID) 112 MCG tablet TAKE 1 TABLET(112 MCG) BY MOUTH DAILY BEFORE BREAKFAST  . spironolactone (ALDACTONE) 25 MG tablet Take 0.5 tablets (12.5 mg total) by mouth daily.  Marland Kitchen acetaminophen (TYLENOL 8 HOUR) 650 MG CR tablet Take 1,300 mg by mouth at bedtime.   . cetirizine (ZYRTEC) 10 MG tablet Take 10 mg by mouth at bedtime.   . Cholecalciferol (VITAMIN D) 50 MCG (2000 UT) tablet Take 4,000 Units by mouth daily.  . diclofenac  sodium (VOLTAREN) 1 % GEL Apply 2 g topically 4 (four) times daily as needed for pain. Foot pain  . docusate sodium (COLACE) 100 MG capsule Take 200 mg by mouth daily.   Marland Kitchen ipratropium (ATROVENT) 0.06 % nasal spray USE 2 SPRAYS IN EACH NOSTRIL THREE TIMES DAILY AS NEEDED FOR RHINITIS  . loratadine (CLARITIN) 10 MG tablet Take 10 mg by mouth daily.  Marland Kitchen LORazepam (ATIVAN) 0.5 MG tablet Take 1 tablet (0.5 mg total) by mouth daily as needed for anxiety or sleep.  . metroNIDAZOLE (METROCREAM) 0.75 % cream Apply topically in the morning and at bedtime.  . Multiple Vitamin (MULTIVITAMIN) tablet Take 1 tablet by mouth daily.  . nitroGLYCERIN (NITROSTAT) 0.4 MG SL tablet Place 1 tablet (0.4 mg total) under the tongue every 5 (five) minutes as needed for chest pain.  Marland Kitchen PATADAY 0.1 % ophthalmic solution   . senna (SENOKOT) 8.6 MG tablet Take 1 tablet by mouth daily.  . sertraline (ZOLOFT) 100 MG tablet TAKE 1 AND 1/2 TABLETS(150 MG) BY MOUTH DAILY   No facility-administered encounter medications on file as of 10/01/2019.    Goals Addressed            This Visit's Progress   . PharmD - Medication Management       Current Barriers:  . Chronic Disease Management support, education, and care coordination needs related to CAD, HTN, HLD, and hx CVA  Pharmacist Clinical Goal(s):  Marland Kitchen Over the next 30 days, patient  will work with CM Pharmacist to address medication management needs  Interventions: . Perform chart review. Patient seen for office visit with Neurology on 8/17. Provider advised patient to: o Continue aspirin 81 mg daily. o Continue Plavix 75 mg daily, refilled.  o Encouraged patient to exercise for 15 minutes per day.  Remus Loffler on importance of blood pressure monitoring and control o Reports patient taking:  Atenolol 50 mg daily at bedtime  Furosemide 80 mg twice daily  Isosorbide ER 30 mg once daily  Spironolactone 25 mg - 1/2 tablet (12.5 mg) once daily o Reports last checked BP  on:   8/31 - 148/73, HR 73 (had just finished mowing lawn) o Counsel on BP monitoring technique, particularly waiting at least 30 minutes after exercise to take reading o Again encourage checking blood pressure regularly as directed by providers, keeping log and bringing log with patient to his medical appointments  Again encourage to schedule f/u visit with Cardiology for patient. Mrs. Bisson confirms has phone number . Discuss importance of medication adherence o Note patient's wife fills weekly pillbox o Reports checks back behind and patient rarely misses a dose of medications . Discuss patient's current exercise: o Wife reports variation in exercise based on how patient is feeling o Reports sometimes uses Cub[employer] (under-desk elliptical) for ~15-20 minutes/day o Reports patient working on walking around more/spending more time outside  Patient Self Care Activities:  . Attends all scheduled provider appointments . Calls pharmacy for medication refills . Calls provider office for new concerns or questions  Please see past updates related to this goal by clicking on the "Past Updates" button in the selected goal         Plan  The care management team will reach out to the patient again over the next 90 days.   Duanne Moron, PharmD, [facility] Clinical Pharmacist [facility] [employer] [phone]

## 2019-10-01 NOTE — Patient Instructions (Signed)
Thank you allowing the Chronic Care Management Team to be a part of your care! It was a pleasure speaking with you today!     CCM (Chronic Care Management) Team    Alto Denver RN, MSN, CCM Nurse Care Coordinator  [phone]   Duanne Moron PharmD  Clinical Pharmacist  [phone]   Dickie La LCSW Clinical Social Worker [phone]  Visit Information  Goals Addressed            This Visit's Progress    PharmD - Medication Management       Current Barriers:   Chronic Disease Management support, education, and care coordination needs related to CAD, HTN, HLD, and hx CVA  Pharmacist Clinical Goal(s):   Over the next 30 days, patient will work with CM Pharmacist to address medication management needs  Interventions:  Perform chart review. Patient seen for office visit with Neurology on 8/17. Provider advised patient to: o Continue aspirin 81 mg daily. o Continue Plavix 75 mg daily, refilled.  o Encouraged patient to exercise for 15 minutes per day.   Counsel on importance of blood pressure monitoring and control o Reports patient taking:  Atenolol 50 mg daily at bedtime  Furosemide 80 mg twice daily  Isosorbide ER 30 mg once daily  Spironolactone 25 mg - 1/2 tablet (12.5 mg) once daily o Reports last checked BP on:   8/31 - 148/73, HR 73 (had just finished mowing lawn) o Counsel on BP monitoring technique, particularly waiting at least 30 minutes after exercise to take reading o Again encourage checking blood pressure regularly as directed by providers, keeping log and bringing log with patient to his medical appointments  Again encourage to schedule f/u visit with Cardiology for patient. Mrs. Cazarez confirms has phone number  Discuss importance of medication adherence o Note patient's wife fills weekly pillbox o Reports checks back behind and patient rarely misses a dose of medications  Discuss patient's current exercise: o Wife reports variation  in exercise based on how patient is feeling o Reports sometimes uses Cub[employer] (under-desk elliptical) for ~15-20 minutes/day o Reports patient working on walking around more/spending more time outside  Patient Self Care Activities:   Attends all scheduled provider appointments  Calls pharmacy for medication refills  Calls provider office for new concerns or questions  Please see past updates related to this goal by clicking on the "Past Updates" button in the selected goal         Patient verbalizes understanding of instructions provided today.   The care management team will reach out to the patient again over the next 90 days.   Duanne Moron, PharmD, [facility] Clinical Pharmacist [facility] Medical [employer] [phone]

## 2019-10-08 ENCOUNTER — Ambulatory Visit: Payer: Medicare PPO | Admitting: Dermatology

## 2019-10-10 ENCOUNTER — Other Ambulatory Visit: Payer: Self-pay | Admitting: Family Medicine

## 2019-10-10 DIAGNOSIS — I25810 Atherosclerosis of coronary artery bypass graft(s) without angina pectoris: Secondary | ICD-10-CM

## 2019-10-10 DIAGNOSIS — I10 Essential (primary) hypertension: Secondary | ICD-10-CM

## 2019-10-11 ENCOUNTER — Other Ambulatory Visit: Payer: Self-pay | Admitting: *Deleted

## 2019-10-11 DIAGNOSIS — E782 Mixed hyperlipidemia: Secondary | ICD-10-CM

## 2019-10-11 DIAGNOSIS — E039 Hypothyroidism, unspecified: Secondary | ICD-10-CM

## 2019-10-11 DIAGNOSIS — R7303 Prediabetes: Secondary | ICD-10-CM

## 2019-10-11 DIAGNOSIS — I10 Essential (primary) hypertension: Secondary | ICD-10-CM

## 2019-10-11 DIAGNOSIS — R351 Nocturia: Secondary | ICD-10-CM

## 2019-10-11 DIAGNOSIS — E669 Obesity, unspecified: Secondary | ICD-10-CM

## 2019-10-11 DIAGNOSIS — I25810 Atherosclerosis of coronary artery bypass graft(s) without angina pectoris: Secondary | ICD-10-CM

## 2019-10-12 ENCOUNTER — Other Ambulatory Visit: Payer: Medicare PPO

## 2019-10-12 DIAGNOSIS — E039 Hypothyroidism, unspecified: Secondary | ICD-10-CM | POA: Diagnosis not present

## 2019-10-12 DIAGNOSIS — R7303 Prediabetes: Secondary | ICD-10-CM | POA: Diagnosis not present

## 2019-10-12 DIAGNOSIS — R351 Nocturia: Secondary | ICD-10-CM | POA: Diagnosis not present

## 2019-10-12 DIAGNOSIS — I10 Essential (primary) hypertension: Secondary | ICD-10-CM | POA: Diagnosis not present

## 2019-10-12 DIAGNOSIS — E669 Obesity, unspecified: Secondary | ICD-10-CM | POA: Diagnosis not present

## 2019-10-12 DIAGNOSIS — I25810 Atherosclerosis of coronary artery bypass graft(s) without angina pectoris: Secondary | ICD-10-CM | POA: Diagnosis not present

## 2019-10-12 DIAGNOSIS — E782 Mixed hyperlipidemia: Secondary | ICD-10-CM | POA: Diagnosis not present

## 2019-10-13 LAB — LIPID PANEL
Cholesterol: 127 mg/dL (ref <200)
HDL: 41 mg/dL (ref >=40)
LDL Cholesterol (Calc): 66 mg/dL (calc)
Non-HDL Cholesterol (Calc): 86 mg/dL (calc) (ref <130)
Total CHOL/HDL Ratio: 3.1 (calc) (ref <5.0)
Triglycerides: 123 mg/dL (ref <150)

## 2019-10-13 LAB — COMPLETE METABOLIC PANEL WITH GFR
AG Ratio: 1.5 (calc) (ref 1.0–2.5)
ALT: 14 U/L (ref 9–46)
AST: 16 U/L (ref 10–35)
Albumin: 4.1 g/dL (ref 3.6–5.1)
Alkaline phosphatase (APISO): 54 U/L (ref 35–144)
BUN: 17 mg/dL (ref 7–25)
CO2: 28 mmol/L (ref 20–32)
Calcium: 9.2 mg/dL (ref 8.6–10.3)
Chloride: 102 mmol/L (ref 98–110)
Creat: 0.99 mg/dL (ref 0.70–1.18)
GFR, Est African American: 84 mL/min/{1.73_m2} (ref >=60)
GFR, Est Non African American: 73 mL/min/{1.73_m2} (ref >=60)
Globulin: 2.8 g/dL (calc) (ref 1.9–3.7)
Glucose, Bld: 120 mg/dL — ABNORMAL HIGH (ref 65–99)
Potassium: 3.7 mmol/L (ref 3.5–5.3)
Sodium: 142 mmol/L (ref 135–146)
Total Bilirubin: 0.4 mg/dL (ref 0.2–1.2)
Total Protein: 6.9 g/dL (ref 6.1–8.1)

## 2019-10-13 LAB — CBC WITH DIFFERENTIAL/PLATELET
Absolute Monocytes: 660 cells/uL (ref 200–950)
Basophils Absolute: 92 cells/uL (ref 0–200)
Basophils Relative: 1.3 %
Eosinophils Absolute: 312 cells/uL (ref 15–500)
Eosinophils Relative: 4.4 %
HCT: 41.4 % (ref 38.5–50.0)
Hemoglobin: 13.6 g/dL (ref 13.2–17.1)
Lymphs Abs: 2166 cells/uL (ref 850–3900)
MCH: 31.9 pg (ref 27.0–33.0)
MCHC: 32.9 g/dL (ref 32.0–36.0)
MCV: 97.2 fL (ref 80.0–100.0)
MPV: 9.2 fL (ref 7.5–12.5)
Monocytes Relative: 9.3 %
Neutro Abs: 3870 cells/uL (ref 1500–7800)
Neutrophils Relative %: 54.5 %
Platelets: 228 10*3/uL (ref 140–400)
RBC: 4.26 10*6/uL (ref 4.20–5.80)
RDW: 13.2 % (ref 11.0–15.0)
Total Lymphocyte: 30.5 %
WBC: 7.1 10*3/uL (ref 3.8–10.8)

## 2019-10-13 LAB — HEMOGLOBIN A1C
Hgb A1c MFr Bld: 6.1 % of total Hgb — ABNORMAL HIGH (ref <5.7)
Mean Plasma Glucose: 128 (calc)
eAG (mmol/L): 7.1 (calc)

## 2019-10-13 LAB — TSH: TSH: 8.16 mIU/L — ABNORMAL HIGH (ref 0.40–4.50)

## 2019-10-13 LAB — T4, FREE: Free T4: 1 ng/dL (ref 0.8–1.8)

## 2019-10-13 LAB — PSA: PSA: 0.98 ng/mL (ref <=4.0)

## 2019-10-17 ENCOUNTER — Other Ambulatory Visit: Payer: Self-pay

## 2019-10-17 ENCOUNTER — Encounter: Payer: Self-pay | Admitting: Family Medicine

## 2019-10-17 ENCOUNTER — Ambulatory Visit (INDEPENDENT_AMBULATORY_CARE_PROVIDER_SITE_OTHER): Payer: Medicare PPO | Admitting: Family Medicine

## 2019-10-17 VITALS — BP 118/70 | HR 69 | Temp 97.7°F | Resp 16 | Ht 68.0 in | Wt 248.0 lb

## 2019-10-17 DIAGNOSIS — R7303 Prediabetes: Secondary | ICD-10-CM | POA: Insufficient documentation

## 2019-10-17 DIAGNOSIS — F3341 Major depressive disorder, recurrent, in partial remission: Secondary | ICD-10-CM

## 2019-10-17 DIAGNOSIS — Z0000 Encounter for general adult medical examination without abnormal findings: Secondary | ICD-10-CM | POA: Diagnosis not present

## 2019-10-17 DIAGNOSIS — E782 Mixed hyperlipidemia: Secondary | ICD-10-CM

## 2019-10-17 DIAGNOSIS — I10 Essential (primary) hypertension: Secondary | ICD-10-CM

## 2019-10-17 DIAGNOSIS — F419 Anxiety disorder, unspecified: Secondary | ICD-10-CM

## 2019-10-17 DIAGNOSIS — I25810 Atherosclerosis of coronary artery bypass graft(s) without angina pectoris: Secondary | ICD-10-CM | POA: Diagnosis not present

## 2019-10-17 DIAGNOSIS — I5022 Chronic systolic (congestive) heart failure: Secondary | ICD-10-CM

## 2019-10-17 DIAGNOSIS — Z9989 Dependence on other enabling machines and devices: Secondary | ICD-10-CM

## 2019-10-17 DIAGNOSIS — E039 Hypothyroidism, unspecified: Secondary | ICD-10-CM

## 2019-10-17 DIAGNOSIS — I6930 Unspecified sequelae of cerebral infarction: Secondary | ICD-10-CM

## 2019-10-17 DIAGNOSIS — Z951 Presence of aortocoronary bypass graft: Secondary | ICD-10-CM

## 2019-10-17 DIAGNOSIS — G4733 Obstructive sleep apnea (adult) (pediatric): Secondary | ICD-10-CM | POA: Diagnosis not present

## 2019-10-17 MED ORDER — LEVOTHYROXINE SODIUM 112 MCG PO TABS: 112.0000 ug | ORAL_TABLET | Freq: Every day | ORAL | 1 refills | Status: DC

## 2019-10-17 MED ORDER — ATORVASTATIN CALCIUM 80 MG PO TABS: 80.0000 mg | ORAL_TABLET | Freq: Every day | ORAL | 3 refills | Status: DC

## 2019-10-17 MED ORDER — FUROSEMIDE 80 MG PO TABS: 80.0000 mg | ORAL_TABLET | Freq: Two times a day (BID) | ORAL | 1 refills | Status: DC

## 2019-10-17 MED ORDER — ATENOLOL 50 MG PO TABS: 50.0000 mg | ORAL_TABLET | Freq: Every day | ORAL | 3 refills | Status: DC

## 2019-10-17 MED ORDER — SERTRALINE HCL 100 MG PO TABS: 150.0000 mg | ORAL_TABLET | Freq: Every day | ORAL | 3 refills | Status: DC

## 2019-10-17 MED ORDER — ISOSORBIDE MONONITRATE ER 30 MG PO TB24: 30.0000 mg | ORAL_TABLET | Freq: Every day | ORAL | 3 refills | Status: DC

## 2019-10-17 NOTE — Progress Notes (Signed)
Subjective:    Patient ID: Luke Mckee, male    DOB: 1940-02-09, 79 y.o.   MRN: [id number]  Luke Mckee is a 79 y.o. male presenting on 10/17/2019 for medicare check up (per spouse patient is having tooth remove in 10/31/2019 just wanted to let Dr Kirtland Bouchard know), Hypertension, Hypothyroidism, and Hyperlipidemia   HPI   Here for Annual Physical and Lab Review.  History of CVA with residual deficit R sided weakness / multiple L MCA / TIA history HTN CAD s/p CABG with stable angina on nitrate  one episode of NTG, sublingual and it resolved, they think it was more likely gas.  Followed by Neuro/Cards, see prior notes for background information. S/p loop recorder. No identified AFib. He had issue with stroke on Eliquis. Now he is back on DAPT Plavix + ASA 81mg  and doing better - Low BP by report, still taking diuretics due to edema per Cardiology - lasix 80 BID but only given 30 day supply last time even though it was ordered He takes imdur 30mg  daily and it controls CP. He needs re order on NTG prior expired, rarely takes. Denies any chest pain, new focal weakness, dyspnea, worsening edema, near syncope or syncope dizzy vertigo numbness  Elevated A1c / PreDM Morbid Obesity BMI >37 Last result A1c 6.1 Improved walking  HYPERLIPIDEMIA: - Reports no concerns. Last lipid panel 10/2019, controlled on high dose statin - Currently taking Atorvastatin 80mg , tolerating well without side effects or myalgias  Hypothyroidism Chronic problem Last lab showed TSH elevated up to >8, normal Free T4 Taking Levothyroxine daily. Not missing doses. He takes first thing in AM, not before.  Anxiety / Depression recurrent in partial remission Rarely bothering him. Has occasional mood symptoms. Doing well on Sertraline. Also for anxiety, takes Lorazepam rarely, has some left  OSA on CPAP Followed by Pulmonology, no repeat sleep study - Today reports that sleep apnea is well controlled. He uses the  CPAP machine every night. Tolerates the machine well, and thinks that sleeps better with it and feels good. No new concerns or symptoms.   Additional update Upper Molar, is infected, has had root canal, needs to be removed. He was given antibiotics, and awaiting for extraction on 10/31/19. He takes ASA and Plavix.  Health Maintenance: UTD PNA Vaccine series. COVID19 vaccine updated.  PSA negative 0.98, no sign of prostate cancer.  Depression screen [facility] 2/9 10/17/2019 07/17/2019 04/16/2019  Decreased Interest 0 0 1  Down, Depressed, Hopeless 0 1 1  PHQ - 2 Score 0 1 2  Altered sleeping 0 0 0  Tired, decreased energy 2 2 2   Change in appetite 0 0 0  Feeling bad or failure about yourself  1 0 1  Trouble concentrating 0 0 2  Moving slowly or fidgety/restless 0 0 0  Suicidal thoughts 0 0 0  PHQ-9 Score 3 3 7   Difficult doing work/chores Somewhat difficult Not difficult at all Not difficult at all    Past Medical History:  Diagnosis Date  . Anxiety   . Arthritis   . CAD (coronary artery disease) 2005   s/p CABG, DES  . CHF (congestive heart failure) (HCC)    in EPIC care everywhere  . Chronic kidney disease   . COPD (chronic obstructive pulmonary disease) (HCC)    in EPIC care everywhere  . Depression   . Heart murmur   . History of stroke   . Hypothyroidism   . OSA (obstructive sleep apnea)   .  Paroxysmal A-fib (HCC)    in EPIC careeverywhere   Past Surgical History:  Procedure Laterality Date  . APPENDECTOMY    . BUNIONECTOMY    . CATARACT EXTRACTION     Right eye  . COLONOSCOPY  07/06/2004  . CORONARY ARTERY BYPASS GRAFT  2005  . LOOP RECORDER INSERTION N/A 07/25/2018   Procedure: LOOP RECORDER INSERTION;  Surgeon: Regan Lemming, MD;  Location: [facility];  Service: Cardiovascular;  Laterality: N/A;  . TOTAL HIP ARTHROPLASTY Right 11/23/2011  . TOTAL HIP ARTHROPLASTY Left 09/07/2011  . TOTAL KNEE ARTHROPLASTY Right 02/15/2018  . VASECTOMY  1978    Social History   Socioeconomic History  . Marital status: Married    Spouse name: Not on file  . Number of children: Not on file  . Years of education: Not on file  . Highest education level: Not on file  Occupational History  . Occupation: retired  Tobacco Use  . Smoking status: Never Smoker  . Smokeless tobacco: Never Used  Vaping Use  . Vaping Use: Never used  Substance and Sexual Activity  . Alcohol use: Not Currently    Comment: past  . Drug use: Never  . Sexual activity: Not on file  Other Topics Concern  . Not on file  Social History Narrative  . Not on file   Social Determinants of Health   Financial Resource Strain: Low Risk   . Difficulty of Paying Living Expenses: Not hard at all  Food Insecurity: No Food Insecurity  . Worried About Programme researcher, broadcasting/film/video in the Last Year: Never true  . Ran Out of Food in the Last Year: Never true  Transportation Needs: No Transportation Needs  . Lack of Transportation (Medical): No  . Lack of Transportation (Non-Medical): No  Physical Activity: Inactive  . Days of Exercise per Week: 0 days  . Minutes of Exercise per Session: 10 min  Stress: No Stress Concern Present  . Feeling of Stress : Not at all  Social Connections: Unknown  . Frequency of Communication with Friends and Family: More than three times a week  . Frequency of Social Gatherings with Friends and Family: More than three times a week  . Attends Religious Services: Not on file  . Active Member of Clubs or Organizations: Not on file  . Attends Banker Meetings: Not on file  . Marital Status: Married  Catering manager Violence: Not At Risk  . Fear of Current or Ex-Partner: No  . Emotionally Abused: No  . Physically Abused: No  . Sexually Abused: No   Family History  Problem Relation Age of Onset  . Anxiety disorder Sister    Current Outpatient Medications on File Prior to Visit  Medication Sig  . acetaminophen (TYLENOL 8 HOUR) 650 MG CR  tablet Take 1,300 mg by mouth at bedtime.   Marland Kitchen aspirin 81 MG chewable tablet Chew 81 mg by mouth daily.  . cetirizine (ZYRTEC) 10 MG tablet Take 10 mg by mouth at bedtime.   . Cholecalciferol (VITAMIN D) 50 MCG (2000 UT) tablet Take 4,000 Units by mouth daily.  . clopidogrel (PLAVIX) 75 MG tablet Take 1 tablet (75 mg total) by mouth daily.  . diclofenac sodium (VOLTAREN) 1 % GEL Apply 2 g topically 4 (four) times daily as needed for pain. Foot pain  . docusate sodium (COLACE) 100 MG capsule Take 200 mg by mouth daily.   Marland Kitchen ipratropium (ATROVENT) 0.06 % nasal spray USE 2 SPRAYS  IN EACH NOSTRIL THREE TIMES DAILY AS NEEDED FOR RHINITIS  . loratadine (CLARITIN) 10 MG tablet Take 10 mg by mouth daily.  Marland Kitchen LORazepam (ATIVAN) 0.5 MG tablet Take 1 tablet (0.5 mg total) by mouth daily as needed for anxiety or sleep.  . metroNIDAZOLE (METROCREAM) 0.75 % cream Apply topically in the morning and at bedtime.  . Multiple Vitamin (MULTIVITAMIN) tablet Take 1 tablet by mouth daily.  . nitroGLYCERIN (NITROSTAT) 0.4 MG SL tablet Place 1 tablet (0.4 mg total) under the tongue every 5 (five) minutes as needed for chest pain.  Marland Kitchen PATADAY 0.1 % ophthalmic solution   . senna (SENOKOT) 8.6 MG tablet Take 1 tablet by mouth daily.  Marland Kitchen spironolactone (ALDACTONE) 25 MG tablet TAKE 1/2 TABLET(12.5 MG) BY MOUTH DAILY   No current facility-administered medications on file prior to visit.    Review of Systems  Constitutional: Negative for activity change, appetite change, chills, diaphoresis, fatigue and fever.  HENT: Negative for congestion and hearing loss.   Eyes: Negative for visual disturbance.  Respiratory: Negative for apnea, cough, chest tightness, shortness of breath and wheezing.   Cardiovascular: Negative for chest pain, palpitations and leg swelling.  Gastrointestinal: Negative for abdominal pain, anal bleeding, blood in stool, constipation, diarrhea, nausea and vomiting.  Endocrine: Negative for cold intolerance.   Genitourinary: Negative for difficulty urinating, dysuria, frequency and hematuria.  Musculoskeletal: Negative for arthralgias, back pain and neck pain.  Skin: Negative for rash.  Allergic/Immunologic: Negative for environmental allergies.  Neurological: Negative for dizziness, weakness, light-headedness, numbness and headaches.  Hematological: Negative for adenopathy.  Psychiatric/Behavioral: Negative for behavioral problems, dysphoric mood and sleep disturbance. The patient is not nervous/anxious.    Per HPI unless specifically indicated above      Objective:    BP 118/70   Pulse 69   Temp 97.7 F (36.5 C) (Temporal)   Resp 16   Ht 5\' 8"  (1.727 m)   Wt 248 lb (112.5 kg)   SpO2 93%   BMI 37.71 kg/m   Wt Readings from Last 3 Encounters:  10/17/19 248 lb (112.5 kg)  07/17/19 246 lb (111.6 kg)  05/30/19 244 lb 12.8 oz (111 kg)    Physical Exam Vitals and nursing note reviewed.  Constitutional:      General: He is not in acute distress.    Appearance: He is well-developed. He is obese. He is not diaphoretic.     Comments: Well-appearing, comfortable, cooperative  HENT:     Head: Normocephalic and atraumatic.  Eyes:     General:        Right eye: No discharge.        Left eye: No discharge.     Conjunctiva/sclera: Conjunctivae normal.     Pupils: Pupils are equal, round, and reactive to light.  Neck:     Thyroid: No thyromegaly.  Cardiovascular:     Rate and Rhythm: Normal rate and regular rhythm.     Heart sounds: Normal heart sounds. No murmur heard.   Pulmonary:     Effort: Pulmonary effort is normal. No respiratory distress.     Breath sounds: Normal breath sounds. No wheezing or rales.  Abdominal:     General: Bowel sounds are normal. There is no distension.     Palpations: Abdomen is soft. There is no mass.     Tenderness: There is no abdominal tenderness.  Musculoskeletal:        General: No tenderness. Normal range of motion.     Cervical  back: Normal  range of motion and neck supple.     Right lower leg: No edema.     Left lower leg: No edema.     Comments: Upper / Lower Extremities: - Normal muscle tone, strength bilateral upper extremities 5/5, lower extremities 5/5  Lymphadenopathy:     Cervical: No cervical adenopathy.  Skin:    General: Skin is warm and dry.     Findings: No erythema or rash.  Neurological:     Mental Status: He is alert and oriented to person, place, and time.     Comments: Distal sensation intact to light touch all extremities  Psychiatric:        Behavior: Behavior normal.     Comments: Well groomed, good eye contact, normal speech and thoughts       Results for orders placed or performed in visit on 10/11/19  T4, free  Result Value Ref Range   Free T4 1.0 0.8 - 1.8 ng/dL  TSH  Result Value Ref Range   TSH 8.16 (H) 0.40 - 4.50 mIU/L  PSA  Result Value Ref Range   PSA 0.98 < OR = 4.0 ng/mL  Lipid panel  Result Value Ref Range   Cholesterol 127 <200 mg/dL   HDL 41 > OR = 40 mg/dL   Triglycerides 161123 <096<150 mg/dL   LDL Cholesterol (Calc) 66 mg/dL (calc)   Total CHOL/HDL Ratio 3.1 <5.0 (calc)   Non-HDL Cholesterol (Calc) 86 <045<130 mg/dL (calc)  COMPLETE METABOLIC PANEL WITH GFR  Result Value Ref Range   Glucose, Bld 120 (H) 65 - 99 mg/dL   BUN 17 7 - 25 mg/dL   Creat 4.090.99 8.110.70 - 9.141.18 mg/dL   GFR, Est Non African American 73 > OR = 60 mL/min/1.6473m2   GFR, Est African American 84 > OR = 60 mL/min/1.2473m2   BUN/Creatinine Ratio NOT APPLICABLE 6 - 22 (calc)   Sodium 142 135 - 146 mmol/L   Potassium 3.7 3.5 - 5.3 mmol/L   Chloride 102 98 - 110 mmol/L   CO2 28 20 - 32 mmol/L   Calcium 9.2 8.6 - 10.3 mg/dL   Total Protein 6.9 6.1 - 8.1 g/dL   Albumin 4.1 3.6 - 5.1 g/dL   Globulin 2.8 1.9 - 3.7 g/dL (calc)   AG Ratio 1.5 1.0 - 2.5 (calc)   Total Bilirubin 0.4 0.2 - 1.2 mg/dL   Alkaline phosphatase (APISO) 54 35 - 144 U/L   AST 16 10 - 35 U/L   ALT 14 9 - 46 U/L  CBC with Differential/Platelet   Result Value Ref Range   WBC 7.1 3.8 - 10.8 Thousand/uL   RBC 4.26 4.20 - 5.80 Million/uL   Hemoglobin 13.6 13.2 - 17.1 g/dL   HCT 78.241.4 38 - 50 %   MCV 97.2 80.0 - 100.0 fL   MCH 31.9 27.0 - 33.0 pg   MCHC 32.9 32.0 - 36.0 g/dL   RDW 95.613.2 21.311.0 - 08.615.0 %   Platelets 228 140 - 400 Thousand/uL   MPV 9.2 7.5 - 12.5 fL   Neutro Abs 3,870 1,500 - 7,800 cells/uL   Lymphs Abs 2,166 850 - 3,900 cells/uL   Absolute Monocytes 660 200 - 950 cells/uL   Eosinophils Absolute 312 15 - 500 cells/uL   Basophils Absolute 92 0 - 200 cells/uL   Neutrophils Relative % 54.5 %   Total Lymphocyte 30.5 %   Monocytes Relative 9.3 %   Eosinophils Relative 4.4 %   Basophils Relative  1.3 %  Hemoglobin A1c  Result Value Ref Range   Hgb A1c MFr Bld 6.1 (H) <5.7 % of total Hgb   Mean Plasma Glucose 128 (calc)   eAG (mmol/L) 7.1 (calc)      Assessment & Plan:   Problem List Items Addressed This Visit    Systolic CHF (HCC)    Euvolemic without exacerbation Continue lasix per cardiology      Relevant Medications   atenolol (TENORMIN) 50 MG tablet   atorvastatin (LIPITOR) 80 MG tablet   furosemide (LASIX) 80 MG tablet   isosorbide mononitrate (IMDUR) 30 MG 24 hr tablet   Recurrent major depressive disorder, in partial remission (HCC)    Stable Continue SSRI      Relevant Medications   sertraline (ZOLOFT) 100 MG tablet   Pre-diabetes    Improved Controlled A1c 6.1      OSA on CPAP    Well controlled, chronic OSA on CPAP - Good adherence to CPAP nightly - Continue current CPAP therapy, patient seems to be benefiting from therapy       Morbid obesity (HCC)    Encourage lifestyle diet exercise      Mixed hyperlipidemia    Controlled cholesterol on statin and lifestyle Last lipid panel 10/2019 CAD, history CVA  Plan: 1. Continue current meds - Atorvastatin  2. DAPT 2ndary ASCVD risk reduction 3. Encourage improved lifestyle - low carb/cholesterol, reduce portion size, continue  improving regular exercise      Relevant Medications   atenolol (TENORMIN) 50 MG tablet   atorvastatin (LIPITOR) 80 MG tablet   furosemide (LASIX) 80 MG tablet   isosorbide mononitrate (IMDUR) 30 MG 24 hr tablet   Hypothyroidism    Asymptomatic Elevated TSH >8 now from 2 No change to med adherence Continue current dose levothyroxine Repeat thyroid panel 6 mo      Relevant Medications   atenolol (TENORMIN) 50 MG tablet   levothyroxine (SYNTHROID) 112 MCG tablet   Hx of CABG   History of cerebrovascular accident (CVA) with residual deficit    Chronic problem Improving overall, but has residual deficits still Followed by Neuro / Cards On DAPT      Coronary artery disease involving coronary bypass graft of native heart without angina pectoris    Chronic CAD s/p CABG On med management now without anginal symptoms Controlled on med management per [facility] Cards      Relevant Medications   atenolol (TENORMIN) 50 MG tablet   atorvastatin (LIPITOR) 80 MG tablet   furosemide (LASIX) 80 MG tablet   isosorbide mononitrate (IMDUR) 30 MG 24 hr tablet   Benign essential HTN    Well-controlled HTN - Home BP readings controlled  Known CAD, prior CVA, OSA    Plan:  1. Continue current BP regimen 2. Encourage improved lifestyle - low sodium diet, regular exercise 3. Continue monitor BP outside office, bring readings to next visit, if persistently >140/90 or new symptoms notify office sooner      Relevant Medications   atenolol (TENORMIN) 50 MG tablet   atorvastatin (LIPITOR) 80 MG tablet   furosemide (LASIX) 80 MG tablet   isosorbide mononitrate (IMDUR) 30 MG 24 hr tablet   Anxiety    Stable, controlled On SSRI On PRN Lorazepam, rarely using. Not due for refill yet, 30 pills >1 year      Relevant Medications   sertraline (ZOLOFT) 100 MG tablet    Other Visit Diagnoses    Annual physical exam    -  Primary   Essential hypertension       Relevant Medications    atenolol (TENORMIN) 50 MG tablet   atorvastatin (LIPITOR) 80 MG tablet   furosemide (LASIX) 80 MG tablet   isosorbide mononitrate (IMDUR) 30 MG 24 hr tablet      Updated Health Maintenance information - Updated Flu vaccine Reviewed recent lab results with patient Encouraged improvement to lifestyle with diet and exercise - Goal of weight loss    Meds ordered this encounter  Medications  . atenolol (TENORMIN) 50 MG tablet    Sig: Take 1 tablet (50 mg total) by mouth at bedtime.    Dispense:  90 tablet    Refill:  3  . atorvastatin (LIPITOR) 80 MG tablet    Sig: Take 1 tablet (80 mg total) by mouth daily.    Dispense:  90 tablet    Refill:  3  . furosemide (LASIX) 80 MG tablet    Sig: Take 1 tablet (80 mg total) by mouth 2 (two) times daily.    Dispense:  180 tablet    Refill:  1  . isosorbide mononitrate (IMDUR) 30 MG 24 hr tablet    Sig: Take 1 tablet (30 mg total) by mouth daily.    Dispense:  90 tablet    Refill:  3  . levothyroxine (SYNTHROID) 112 MCG tablet    Sig: Take 1 tablet (112 mcg total) by mouth daily before breakfast.    Dispense:  90 tablet    Refill:  1  . sertraline (ZOLOFT) 100 MG tablet    Sig: Take 1.5 tablets (150 mg total) by mouth daily.    Dispense:  135 tablet    Refill:  3      Follow up plan: Return in about 6 months (around 04/16/2020) for 6 month fasting lab only then 1 week later Follow-up Thyroid, lab results.   Repeat TSH Free T4 in 6 months  Saralyn Pilar, DO [facility] 10/17/2019, 2:30 PM

## 2019-10-17 NOTE — Patient Instructions (Addendum)
Thank you for coming to the office today.  TSH elevated to 8, but still has normal hormone in body, continue levothyroxine 112 daily, make sure to avoid taking other supplements/meal or other things at the time you take it for at least 20-30 min  Repeat Thyroid test in 6 months  DUE for FASTING BLOOD WORK (no food or drink after midnight before the lab appointment, only water or coffee without cream/sugar on the morning of)  SCHEDULE "Lab Only" visit in the morning at the clinic for lab draw in 6 MONTHS   - Make sure Lab Only appointment is at about 1 week before your next appointment, so that results will be available  For Lab Results, once available within 2-3 days of blood draw, you can can log in to MyChart online to view your results and a brief explanation. Also, we can discuss results at next follow-up visit.   Please schedule a Follow-up Appointment to: Return in about 6 months (around 04/16/2020) for 6 month fasting lab only then 1 week later Follow-up Thyroid, lab results.  If you have any other questions or concerns, please feel free to call the office or send a message through MyChart. You may also schedule an earlier appointment if necessary.  Additionally, you may be receiving a survey about your experience at our office within a few days to 1 week by e-mail or mail. We value your feedback.  Saralyn Pilar, DO [facility], [place]

## 2019-10-18 ENCOUNTER — Other Ambulatory Visit: Payer: Self-pay | Admitting: Family Medicine

## 2019-10-18 DIAGNOSIS — E039 Hypothyroidism, unspecified: Secondary | ICD-10-CM

## 2019-10-18 NOTE — Assessment & Plan Note (Signed)
Stable, controlled On SSRI On PRN Lorazepam, rarely using. Not due for refill yet, 30 pills >1 year

## 2019-10-18 NOTE — Assessment & Plan Note (Signed)
Controlled cholesterol on statin and lifestyle Last lipid panel 10/2019 CAD, history CVA  Plan: 1. Continue current meds - Atorvastatin 80mg  2. DAPT 2ndary ASCVD risk reduction 3. Encourage improved lifestyle - low carb/cholesterol, reduce portion size, continue improving regular exercise

## 2019-10-18 NOTE — Assessment & Plan Note (Addendum)
Asymptomatic Elevated TSH >8 now from 2 No change to med adherence Continue current dose levothyroxine Repeat thyroid panel 6 mo

## 2019-10-18 NOTE — Assessment & Plan Note (Signed)
Euvolemic without exacerbation Continue lasix per cardiology

## 2019-10-18 NOTE — Assessment & Plan Note (Signed)
Well controlled, chronic OSA on CPAP - Good adherence to CPAP nightly - Continue current CPAP therapy, patient seems to be benefiting from therapy  

## 2019-10-18 NOTE — Assessment & Plan Note (Signed)
Well-controlled HTN - Home BP readings controlled  Known CAD, prior CVA, OSA    Plan:  1. Continue current BP regimen 2. Encourage improved lifestyle - low sodium diet, regular exercise 3. Continue monitor BP outside office, bring readings to next visit, if persistently >140/90 or new symptoms notify office sooner 

## 2019-10-18 NOTE — Assessment & Plan Note (Signed)
Encourage lifestyle diet exercise 

## 2019-10-18 NOTE — Assessment & Plan Note (Signed)
Chronic CAD s/p CABG On med management now without anginal symptoms Controlled on med management per [facility] Cards

## 2019-10-18 NOTE — Assessment & Plan Note (Signed)
Chronic problem Improving overall, but has residual deficits still Followed by Neuro / Cards On DAPT

## 2019-10-18 NOTE — Assessment & Plan Note (Signed)
Improved °Controlled A1c 6.1 °

## 2019-10-18 NOTE — Assessment & Plan Note (Signed)
Stable. Continue SSRI.

## 2019-10-20 LAB — CUP PACEART REMOTE DEVICE CHECK
Date Time Interrogation Session: 20211008233808
Implantable Pulse Generator Implant Date: 20200714

## 2019-10-29 ENCOUNTER — Ambulatory Visit (INDEPENDENT_AMBULATORY_CARE_PROVIDER_SITE_OTHER): Payer: Medicare PPO

## 2019-10-29 DIAGNOSIS — G459 Transient cerebral ischemic attack, unspecified: Secondary | ICD-10-CM

## 2019-11-01 ENCOUNTER — Ambulatory Visit: Payer: Medicare PPO | Admitting: Podiatry

## 2019-11-02 NOTE — Progress Notes (Signed)
Carelink Summary Report / Loop Recorder 

## 2019-11-05 ENCOUNTER — Ambulatory Visit: Payer: Self-pay | Admitting: General Practice

## 2019-11-05 ENCOUNTER — Other Ambulatory Visit: Payer: Self-pay | Admitting: Family Medicine

## 2019-11-05 ENCOUNTER — Telehealth: Payer: Medicare PPO | Admitting: General Practice

## 2019-11-05 DIAGNOSIS — I10 Essential (primary) hypertension: Secondary | ICD-10-CM

## 2019-11-05 DIAGNOSIS — I25810 Atherosclerosis of coronary artery bypass graft(s) without angina pectoris: Secondary | ICD-10-CM

## 2019-11-05 DIAGNOSIS — I63412 Cerebral infarction due to embolism of left middle cerebral artery: Secondary | ICD-10-CM

## 2019-11-05 DIAGNOSIS — E782 Mixed hyperlipidemia: Secondary | ICD-10-CM

## 2019-11-05 DIAGNOSIS — J300 Vasomotor rhinitis: Secondary | ICD-10-CM

## 2019-11-05 DIAGNOSIS — G8191 Hemiplegia, unspecified affecting right dominant side: Secondary | ICD-10-CM

## 2019-11-05 NOTE — Chronic Care Management (AMB) (Signed)
Chronic Care Management   Follow Up Note   11/05/2019 Name: Luke Mckee MRN: [id number] DOB: 07-26-1940  Referred by: [name], DO Reason for referral : Chronic Care Management (RNCM Follow up Call for Chronic Disease Management and Care Coordination Needs)   Luke Mckee is a 79 y.o. year old male who is a primary care patient of [name], DO. The CCM team was consulted for assistance with chronic disease management and care coordination needs.    Review of patient status, including review of consultants reports, relevant laboratory and other test results, and collaboration with appropriate care team members and the patient's provider was performed as part of comprehensive patient evaluation and provision of chronic care management services.    SDOH (Social Determinants of Health) assessments performed: Yes See Care Plan activities for detailed interventions related to [facility])     Outpatient Encounter Medications as of 11/05/2019  Medication Sig  . acetaminophen (TYLENOL 8 HOUR) 650 MG CR tablet Take 1,300 mg by mouth at bedtime.   Marland Kitchen aspirin 81 MG chewable tablet Chew 81 mg by mouth daily.  Marland Kitchen atenolol (TENORMIN) 50 MG tablet Take 1 tablet (50 mg total) by mouth at bedtime.  Marland Kitchen atorvastatin (LIPITOR) 80 MG tablet Take 1 tablet (80 mg total) by mouth daily.  . cetirizine (ZYRTEC) 10 MG tablet Take 10 mg by mouth at bedtime.   . Cholecalciferol (VITAMIN D) 50 MCG (2000 UT) tablet Take 4,000 Units by mouth daily.  . clopidogrel (PLAVIX) 75 MG tablet Take 1 tablet (75 mg total) by mouth daily.  . diclofenac sodium (VOLTAREN) 1 % GEL Apply 2 g topically 4 (four) times daily as needed for pain. Foot pain  . docusate sodium (COLACE) 100 MG capsule Take 200 mg by mouth daily.   . furosemide (LASIX) 80 MG tablet Take 1 tablet (80 mg total) by mouth 2 (two) times daily.  Marland Kitchen ipratropium (ATROVENT) 0.06 % nasal spray USE 2 SPRAYS IN EACH NOSTRIL THREE TIMES DAILY AS  NEEDED FOR RHINITIS  . isosorbide mononitrate (IMDUR) 30 MG 24 hr tablet Take 1 tablet (30 mg total) by mouth daily.  Marland Kitchen levothyroxine (SYNTHROID) 112 MCG tablet Take 1 tablet (112 mcg total) by mouth daily before breakfast.  . loratadine (CLARITIN) 10 MG tablet Take 10 mg by mouth daily.  Marland Kitchen LORazepam (ATIVAN) 0.5 MG tablet Take 1 tablet (0.5 mg total) by mouth daily as needed for anxiety or sleep.  . metroNIDAZOLE (METROCREAM) 0.75 % cream Apply topically in the morning and at bedtime.  . Multiple Vitamin (MULTIVITAMIN) tablet Take 1 tablet by mouth daily.  . nitroGLYCERIN (NITROSTAT) 0.4 MG SL tablet Place 1 tablet (0.4 mg total) under the tongue every 5 (five) minutes as needed for chest pain.  Marland Kitchen PATADAY 0.1 % ophthalmic solution   . senna (SENOKOT) 8.6 MG tablet Take 1 tablet by mouth daily.  . sertraline (ZOLOFT) 100 MG tablet Take 1.5 tablets (150 mg total) by mouth daily.  Marland Kitchen spironolactone (ALDACTONE) 25 MG tablet TAKE 1/2 TABLET(12.5 MG) BY MOUTH DAILY   No facility-administered encounter medications on file as of 11/05/2019.     Objective:  BP Readings from Last 3 Encounters:  10/17/19 118/70  07/17/19 132/72  05/30/19 110/70    Goals Addressed              This Visit's Progress   .  COMPLETED: RNCM- Pt's wife: "They want Luke Mckee to do another sleep apnea test" (pt-stated)  CARE PLAN ENTRY (see longitudinal plan of care for additional care plan information)  Current Barriers: Closing goal. The patient has his needs in relation to OSA taken care of.  . Knowledge Deficits related to the need for a new sleep apnea study in the home setting and concerns with the patient not being able to use his cpap machine while doing the study.  . Care Coordination needs related to sleep apnea in a patient with obstructive sleep apnea (disease states) . Chronic Disease Management support and education needs related to obstructive sleep apnea . Spouse concern over in home study for  sleep apnea and request the patient have the study done in the clinical setting  Nurse Case Manager Clinical Goal(s):  Marland Kitchen Over the next 120 days, patient will verbalize understanding of plan for obtaining needed information for adequate therapy and treatment related to obstructive sleep apnea.  . Over the next 120 days, patient will work with [facility] and pcp to address needs related to the need for a new sleep apnea test.  . Over the next 120 days, patient will attend all scheduled medical appointments: next appointment with pcp 10-17-2019 but may need an earlier appointment  Interventions:  . Inter-disciplinary care team collaboration (see longitudinal plan of care) . Evaluation of current treatment plan related to sleep apnea and patient's adherence to plan as established by provider. . Advised patient to continue current treatment regimen at this time . Provided education to patient re: working with the pcp to decide the best course of action to take in meeting needs related to obstructive sleep apnea.  08-27-2019: Advised the patient and patients wife to discuss the sleep study with the pulmonary provider. Per the patients wife the patient has an appointment tomorrow at [facility] with the pulmonary provider.  Marland Kitchen Collaborated with pcp regarding the spouses concern over having the sleep study at home without use of his current cpap machine versus having study done in the clinical setting with health professionals available.  07-18-2019: Spoke to the patients wife. She does not know who told them to do this and could not find the paperwork. Education provided that the team would need to know why there needed to be a new sleep study and that Dr. [name] would be willing to talk to the referring party to see what could be arranged. The RNCM will do research as time permits to see what information about referral and needs of the patient are.  Will monitor and communicate findings as appropriate.  08-27-2019: After discussion and collaboration with Dr. [name], advised the patient and the patients wife to discuss the sleep study with the pulmonary provider as they are the ones who did the referral. The wife feels this is redundant as they have been compliant with the treatment plan and do not understand why the patient needs a new study. Education and support given.  . Discussed plans with patient for ongoing care management follow up and provided patient with direct contact information for care management team  Patient Self Care Activities:  . Patient verbalizes understanding of plan to work with pcp and CCM team to meet needs related to obstructive sleep apnea . Attends all scheduled provider appointments . Calls provider office for new concerns or questions . Unable to independently do sleep study without the use of cpap due to obstructive sleep apnea  Please see past updates related to this goal by clicking on the "Past Updates" button in the selected goal      .  RNCM: I have not started exercising yet        CARE PLAN ENTRY (see longtitudinal plan of care for additional care plan information)  Current Barriers:  . Chronic Disease Management support, education, and care coordination needs related to CAD, HTN, HLD, and CVA  Clinical Goal(s) related to CAD, HTN, HLD, and CVA :  Over the next 90 days, patient will:  . Work with the care management team to address educational, disease management, and care coordination needs  . Begin or continue self health monitoring activities as directed today Measure and record blood pressure 4 times per week and start exercise plan per recommendations of neurologist with goal of 15 mins per day . Call provider office for new or worsened signs and symptoms Blood pressure findings outside established parameters, Shortness of breath, and New or worsened symptom related to HLD and CVA . Call care management team with questions or  concerns . Verbalize basic understanding of patient centered plan of care established today  Interventions related to CAD, HTN, HLD, and CVA :  . Evaluation of current treatment plans and patient's adherence to plan as established by provider.  11-05-2019: Saw pcp recently for annual physical and got a good report. The patients wife states he will have a tooth extracted on Wednesday this week and will see the podiatrist next week. The patient was not at home, he was visiting the neighbor but the patient is doing well.  . Education on writing blood pressure readings down so written log could be taken to the provider office on visits.  The patients wife is writing the patients blood pressures down. [name].  Per the wife the patient was very active that day. [name].  Oxygen sats are 92-96%.  The patient remains stable with blood pressures and heart rate. Oxygen sats are stable as well. 11-05-2019: Patient is doing well. No new issues related to chronic conditions at this time.  . Assessed patient understanding of disease states.  Good understanding of chronic diseases and how to effectively manage.  The patients wife states that he was not very happy with the last podiatrist he saw because he cut his toe and it took a while to heal.  She had to reschedule his appointment for 11-12-2019.  Education provided on talking to the provider and if possible move to a different podiatrist within the office.  . Assessed patient's education and care coordination needs. 08-27-2019: Review of needs. The patients wife states the patient is doing well.  The patient is using a herbal medication instead of Lamisil and this is working well for him at this time. Denies any issues or concerns. 11-05-2019: Education on keeping appointments and discussing concerns with the provider. Review of taking  thyroid medications on an empty stomach.  The wife also states the patient needs a refill for his ipratropium. The pharmacy will contact Dr. Althea CharonKaramalegos for refill. Will also send in basket message with the chart for pcp.  . Assessed patient's exercise patterns. The patient has started an exercise regimen, he and his wife purchased a [place] and he is doing 2000 steps daily. This takes him approximately 30 minutes to do.  He feels this is also helping his balance.  The neurologist recommended last week the patient start exercising and do 15 minutes per day. Per the wife he  does take some days off but he is still continuing to use regularly. The patient continues to use the [place] and is doing well.  . Provided disease specific education to patient. Education and review. Endorses compliance . Collaborated with appropriate clinical care team members regarding patient needs . Reviewed upcoming appointments: pcp appointment on 04-16-2020. Knows to call sooner if an earlier appointment needed.  . Evaluation of current treatment related to the nail fungus the patient was experiencing and the use of Lamisil. The patient verbalized he is using a herbal medication his wife got from him and they decided against the Lamisil. The herbal medication is working for the patient and he has not new concerns at this time. The patient and patients wife have also discussed this with the clinical pharmacist.  . Evaluation of visit to the dermatologist. The patient had 8 pre-cancerous places removed from his arms, hands, temple and trunk. They have blistered over and the patient is putting Vaseline and Band-Aids on the area as instructed by the provider.  . Evaluation of visit to the [place].  The patients visit went well. The patient seems to be stable at this time. Will continue to monitor for changes.  . Review of needs for CCM team collaboration. The patient works with the CCM pharmacist. Denies any needs from the LCSW at this time.    CAD, HTN, HLD, and CVA :  . Patient is unable to independently self-manage chronic health conditions  Please see past updates related to this goal by clicking on the "Past Updates" button in the selected goal          Plan:   Telephone follow up appointment with care management team member scheduled for: 12-31-2019 at 1 pm   Alto Denver RN, MSN, CCM Community Care Coordinator [facility]  [facility] [place] Mobile: [phone]

## 2019-11-05 NOTE — Telephone Encounter (Signed)
Requested medication (s) are due for refill today: yes  Requested medication (s) are on the active medication list:yes  Last refill:  05/10/19  Future visit scheduled:yes  Notes to clinic:  no assigned protocol    Requested Prescriptions  Pending Prescriptions Disp Refills   ipratropium (ATROVENT) 0.06 % nasal spray [Pharmacy Med Name: IPRATROPIUM 0.06% NAS SP (165)] 15 mL 2    Sig: USE 2 SPRAYS IN EACH NOSTRIL THREE TIMES DAILY AS NEEDED FOR RHINITIS      Off-Protocol Failed - 11/05/2019 12:59 PM      Failed - Medication not assigned to a protocol, review manually.      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)   [facility] [name], DO   1 year ago History of cerebrovascular accident (CVA) with residual deficit   [facility] [name], Netta [name], DO       Future Appointments             In 5 months [name], Netta [name], DO [facility], [facility]

## 2019-11-05 NOTE — Patient Instructions (Signed)
Visit Information  Goals Addressed              This Visit's Progress   .  COMPLETED: RNCM- Pt's wife: "They want Randy to do another sleep apnea test" (pt-stated)        CARE PLAN ENTRY (see longitudinal plan of care for additional care plan information)  Current Barriers: Closing goal. The patient has his needs in relation to OSA taken care of.  . Knowledge Deficits related to the need for a new sleep apnea study in the home setting and concerns with the patient not being able to use his cpap machine while doing the study.  . Care Coordination needs related to sleep apnea in a patient with obstructive sleep apnea (disease states) . Chronic Disease Management support and education needs related to obstructive sleep apnea . Spouse concern over in home study for sleep apnea and request the patient have the study done in the clinical setting  Nurse Case Manager Clinical Goal(s):  Marland Kitchen Over the next 120 days, patient will verbalize understanding of plan for obtaining needed information for adequate therapy and treatment related to obstructive sleep apnea.  . Over the next 120 days, patient will work with [facility] and pcp to address needs related to the need for a new sleep apnea test.  . Over the next 120 days, patient will attend all scheduled medical appointments: next appointment with pcp 10-17-2019 but may need an earlier appointment  Interventions:  . Inter-disciplinary care team collaboration (see longitudinal plan of care) . Evaluation of current treatment plan related to sleep apnea and patient's adherence to plan as established by provider. . Advised patient to continue current treatment regimen at this time . Provided education to patient re: working with the pcp to decide the best course of action to take in meeting needs related to obstructive sleep apnea.  08-27-2019: Advised the patient and patients wife to discuss the sleep study with the pulmonary provider. Per the patients wife the  patient has an appointment tomorrow at [facility] with the pulmonary provider.  Marland Kitchen Collaborated with pcp regarding the spouses concern over having the sleep study at home without use of his current cpap machine versus having study done in the clinical setting with health professionals available.  07-18-2019: Spoke to the patients wife. She does not know who told them to do this and could not find the paperwork. Education provided that the team would need to know why there needed to be a new sleep study and that Dr. [name] would be willing to talk to the referring party to see what could be arranged. The RNCM will do research as time permits to see what information about referral and needs of the patient are.  Will monitor and communicate findings as appropriate. 08-27-2019: After discussion and collaboration with Dr. [name], advised the patient and the patients wife to discuss the sleep study with the pulmonary provider as they are the ones who did the referral. The wife feels this is redundant as they have been compliant with the treatment plan and do not understand why the patient needs a new study. Education and support given.  . Discussed plans with patient for ongoing care management follow up and provided patient with direct contact information for care management team  Patient Self Care Activities:  . Patient verbalizes understanding of plan to work with pcp and CCM team to meet needs related to obstructive sleep apnea . Attends all scheduled provider appointments . Calls provider office  for new concerns or questions . Unable to independently do sleep study without the use of cpap due to obstructive sleep apnea  Please see past updates related to this goal by clicking on the "Past Updates" button in the selected goal      .  RNCM: I have not started exercising yet        CARE PLAN ENTRY (see longtitudinal plan of care for additional care plan information)  Current Barriers:   . Chronic Disease Management support, education, and care coordination needs related to CAD, HTN, HLD, and CVA  Clinical Goal(s) related to CAD, HTN, HLD, and CVA :  Over the next 90 days, patient will:  . Work with the care management team to address educational, disease management, and care coordination needs  . Begin or continue self health monitoring activities as directed today Measure and record blood pressure 4 times per week and start exercise plan per recommendations of neurologist with goal of 15 mins per day . Call provider office for new or worsened signs and symptoms Blood pressure findings outside established parameters, Shortness of breath, and New or worsened symptom related to HLD and CVA . Call care management team with questions or concerns . Verbalize basic understanding of patient centered plan of care established today  Interventions related to CAD, HTN, HLD, and CVA :  . Evaluation of current treatment plans and patient's adherence to plan as established by provider.  11-05-2019: Saw pcp recently for annual physical and got a good report. The patients wife states he will have a tooth extracted on Wednesday this week and will see the podiatrist next week. The patient was not at home, he was visiting the neighbor but the patient is doing well.  . Education on writing blood pressure readings down so written log could be taken to the provider office on visits.  The patients wife is writing the patients blood pressures down. [name].  Per the wife the patient was very active that day. [name].  Oxygen sats are 92-96%.  The patient remains stable with blood pressures and heart rate. Oxygen sats are stable as well. 11-05-2019: Patient is doing well. No new issues related to chronic conditions at this time.  . Assessed patient understanding of disease  states.  Good understanding of chronic diseases and how to effectively manage.  The patients wife states that he was not very happy with the last podiatrist he saw because he cut his toe and it took a while to heal.  She had to reschedule his appointment for 11-12-2019.  Education provided on talking to the provider and if possible move to a different podiatrist within the office.  . Assessed patient's education and care coordination needs. 08-27-2019: Review of needs. The patients wife states the patient is doing well.  The patient is using a herbal medication instead of Lamisil and this is working well for him at this time. Denies any issues or concerns. 11-05-2019: Education on keeping appointments and discussing concerns with the provider. Review of taking thyroid medications on an empty stomach.  The wife also states the patient needs a refill for his ipratropium. The pharmacy will contact Dr. [name] for refill. Will also send in basket message with the chart for pcp.  . Assessed patient's exercise patterns. The patient has started an exercise regimen, he and  his wife purchased a [place] and he is doing 2000 steps daily. This takes him approximately 30 minutes to do.  He feels this is also helping his balance.  The neurologist recommended last week the patient start exercising and do 15 minutes per day. Per the wife he does take some days off but he is still continuing to use regularly. The patient continues to use the [place] and is doing well.  . Provided disease specific education to patient. Education and review. Endorses compliance . Collaborated with appropriate clinical care team members regarding patient needs . Reviewed upcoming appointments: pcp appointment on 04-16-2020. Knows to call sooner if an earlier appointment needed.  . Evaluation of current treatment related to the nail fungus the patient was experiencing and the use of Lamisil. The patient verbalized he is using a herbal medication his  wife got from him and they decided against the Lamisil. The herbal medication is working for the patient and he has not new concerns at this time. The patient and patients wife have also discussed this with the clinical pharmacist.  . Evaluation of visit to the dermatologist. The patient had 8 pre-cancerous places removed from his arms, hands, temple and trunk. They have blistered over and the patient is putting Vaseline and Band-Aids on the area as instructed by the provider.  . Evaluation of visit to the [place].  The patients visit went well. The patient seems to be stable at this time. Will continue to monitor for changes.  . Review of needs for CCM team collaboration. The patient works with the CCM pharmacist. Denies any needs from the LCSW at this time.   CAD, HTN, HLD, and CVA :  . Patient is unable to independently self-manage chronic health conditions  Please see past updates related to this goal by clicking on the "Past Updates" button in the selected goal         Patient verbalizes understanding of instructions provided today.   Telephone follow up appointment with care management team member scheduled for:  12-31-2019 at 1 pm  Alto Denver RN, MSN, CCM Community Care Coordinator [facility]  [facility] [place] Mobile: [phone]

## 2019-11-12 ENCOUNTER — Ambulatory Visit (INDEPENDENT_AMBULATORY_CARE_PROVIDER_SITE_OTHER): Payer: Medicare PPO | Admitting: Podiatry

## 2019-11-12 ENCOUNTER — Other Ambulatory Visit: Payer: Self-pay

## 2019-11-12 ENCOUNTER — Encounter: Payer: Self-pay | Admitting: Podiatry

## 2019-11-12 DIAGNOSIS — M205X1 Other deformities of toe(s) (acquired), right foot: Secondary | ICD-10-CM | POA: Insufficient documentation

## 2019-11-12 DIAGNOSIS — B351 Tinea unguium: Secondary | ICD-10-CM

## 2019-11-12 DIAGNOSIS — M79676 Pain in unspecified toe(s): Secondary | ICD-10-CM | POA: Diagnosis not present

## 2019-11-12 NOTE — Progress Notes (Signed)
This patient returns to my office for at risk foot care.  This patient requires this care by a professional since this patient will be at risk due to having chronic kidney disease, coagulation defect  and CVA.  Patient is taking plavix.    This patient is unable to cut nails himself since the patient cannot reach his nails.These nails are painful walking and wearing shoes.  This patient presents for at risk foot care today.  General Appearance  Alert, conversant and in no acute stress.  Vascular  Dorsalis pedis and posterior tibial  pulses are palpable  bilaterally.  Capillary return is within normal limits  bilaterally. Temperature is within normal limits  bilaterally.  Neurologic  Senn-Weinstein monofilament wire test within normal limits  bilaterally. Muscle power within normal limits bilaterally.  Nails Thick disfigured discolored nails with subungual debris  from hallux to fifth toes bilaterally. No evidence of bacterial infection or drainage bilaterally.  Orthopedic  No limitations of motion  feet .  No crepitus or effusions noted.  No bony pathology or digital deformities noted. 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  Skin  normotropic skin with no porokeratosis noted bilaterally.  No signs of infections or ulcers noted.   Pinch callus right hallux.  Onychomycosis  Pain in right toes  Pain in left toes  Consent was obtained for treatment procedures.   Mechanical debridement of nails 1-5  bilaterally performed with a nail nipper.  Filed with dremel without incident.    Return office visit    3 months                  Told patient to return for periodic foot care and evaluation due to potential at risk complications.   Helane Gunther DPM

## 2019-11-14 DIAGNOSIS — Z23 Encounter for immunization: Secondary | ICD-10-CM | POA: Diagnosis not present

## 2019-12-03 ENCOUNTER — Ambulatory Visit (INDEPENDENT_AMBULATORY_CARE_PROVIDER_SITE_OTHER): Payer: Medicare PPO

## 2019-12-03 DIAGNOSIS — I63412 Cerebral infarction due to embolism of left middle cerebral artery: Secondary | ICD-10-CM | POA: Diagnosis not present

## 2019-12-03 LAB — CUP PACEART REMOTE DEVICE CHECK
Date Time Interrogation Session: 20211121231906
Implantable Pulse Generator Implant Date: 20200714

## 2019-12-04 NOTE — Progress Notes (Signed)
Carelink Summary Report / Loop Recorder 

## 2019-12-12 ENCOUNTER — Other Ambulatory Visit: Payer: Medicare PPO

## 2019-12-12 DIAGNOSIS — E039 Hypothyroidism, unspecified: Secondary | ICD-10-CM | POA: Diagnosis not present

## 2019-12-13 LAB — TSH: TSH: 7.25 mIU/L — ABNORMAL HIGH (ref 0.40–4.50)

## 2019-12-13 LAB — T4, FREE: Free T4: 1.1 ng/dL (ref 0.8–1.8)

## 2019-12-17 ENCOUNTER — Other Ambulatory Visit: Payer: Self-pay

## 2019-12-17 ENCOUNTER — Telehealth: Payer: Medicare PPO

## 2019-12-17 ENCOUNTER — Other Ambulatory Visit: Payer: Self-pay | Admitting: Family Medicine

## 2019-12-17 ENCOUNTER — Telehealth: Payer: Self-pay | Admitting: Pharmacist

## 2019-12-17 DIAGNOSIS — E039 Hypothyroidism, unspecified: Secondary | ICD-10-CM

## 2019-12-17 MED ORDER — LEVOTHYROXINE SODIUM 125 MCG PO TABS: 125.0000 ug | ORAL_TABLET | Freq: Every day | ORAL | 1 refills | Status: DC

## 2019-12-17 NOTE — Progress Notes (Signed)
Date:  12/18/2019   ID:  Luke Mckee, DOB 1940/06/25, MRN [id number]  Patient Location:  [address] [place] [address]   Provider location:   [facility], [place] office  PCP:  [name], DO  Cardiologist:  Fonnie Mu  Chief Complaint  Patient presents with   office visit    F/U appointment-Patient reports DOE; Meds verbally reviewed with patient.    History of Present Illness:    Luke Mckee is a 79 y.o. male  past medical history of CAD s/p CABG x 4 in [place] 2005, and stent placement x 2 in past 2-3 years, Anxiety HTN Recent lacunar stroke  prior CVA identified on outside imaging in [place] 04/2018,  Hospital 5 days, rehab 6 days Right side affected,  Loop monitor, no atrial fibrillation Ejection fraction 50% Who presents for coronary artery disease  Sedentary, legs weak No regular exercise program Wife reports he is depressed, longstanding issue, likely exacerbated by stroke Not much of an appetite  No recent TIA or stroke symptoms Recent change to thyroid medication dosing Tolerating aspirin with Plavix  Denies any chest pain concerning for angina Cholesterol at goal  Loop monitor in place, records reviewed, no atrial fibrillation  Some pedal edema, symptoms seem to come and go Goes out to eat at restaurants, times frequently   Prior CV studies:   The following studies were reviewed today:  Cath possibly >2 years ago, stent x 1 Prior cath >3 years , stent x 1 Details unclear  Echocardiogram July 2020, EF 50%, mildly decreased RV function   Past Medical History:  Diagnosis Date   Anxiety    Arthritis    CAD (coronary artery disease) 2005   s/p CABG, DES   CHF (congestive heart failure) (HCC)    in EPIC care everywhere   Chronic kidney disease    COPD (chronic obstructive pulmonary disease) (HCC)    in EPIC care everywhere   Depression    Heart murmur    History of stroke     Hypothyroidism    OSA (obstructive sleep apnea)    Paroxysmal A-fib (HCC)    in EPIC careeverywhere   Past Surgical History:  Procedure Laterality Date   APPENDECTOMY     BUNIONECTOMY     CATARACT EXTRACTION     Right eye   COLONOSCOPY  07/06/2004   CORONARY ARTERY BYPASS GRAFT  2005   LOOP RECORDER INSERTION N/A 07/25/2018   Procedure: LOOP RECORDER INSERTION;  Surgeon: Regan Lemming, MD;  Location: [facility];  Service: Cardiovascular;  Laterality: N/A;   TOTAL HIP ARTHROPLASTY Right 11/23/2011   TOTAL HIP ARTHROPLASTY Left 09/07/2011   TOTAL KNEE ARTHROPLASTY Right 02/15/2018   VASECTOMY  1978     Current Meds  Medication Sig   acetaminophen (TYLENOL 8 HOUR) 650 MG CR tablet Take 1,300 mg by mouth at bedtime.    aspirin 81 MG chewable tablet Chew 81 mg by mouth daily.   atenolol (TENORMIN) 50 MG tablet Take 1 tablet (50 mg total) by mouth at bedtime.   atorvastatin (LIPITOR) 80 MG tablet Take 1 tablet (80 mg total) by mouth daily.   cetirizine (ZYRTEC) 10 MG tablet Take 10 mg by mouth at bedtime.    Cholecalciferol (VITAMIN D) 50 MCG (2000 UT) tablet Take 4,000 Units by mouth daily.   clopidogrel (PLAVIX) 75 MG tablet Take 1 tablet (75 mg total) by mouth daily.  diclofenac sodium (VOLTAREN) 1 % GEL Apply 2 g topically 4 (four) times daily as needed for pain. Foot pain   docusate sodium (COLACE) 100 MG capsule Take 200 mg by mouth daily.    furosemide (LASIX) 80 MG tablet Take 1 tablet (80 mg total) by mouth 2 (two) times daily.   ipratropium (ATROVENT) 0.06 % nasal spray USE 2 SPRAYS IN EACH NOSTRIL THREE TIMES DAILY AS NEEDED FOR RHINITIS   isosorbide mononitrate (IMDUR) 30 MG 24 hr tablet Take 1 tablet (30 mg total) by mouth daily.   levothyroxine (SYNTHROID) 125 MCG tablet Take 1 tablet (125 mcg total) by mouth daily before breakfast.   loratadine (CLARITIN) 10 MG tablet Take 10 mg by mouth daily.   LORazepam (ATIVAN) 0.5 MG tablet  Take 1 tablet (0.5 mg total) by mouth daily as needed for anxiety or sleep.   metroNIDAZOLE (METROCREAM) 0.75 % cream Apply topically in the morning and at bedtime.   Multiple Vitamin (MULTIVITAMIN) tablet Take 1 tablet by mouth daily.   nitroGLYCERIN (NITROSTAT) 0.4 MG SL tablet Place 1 tablet (0.4 mg total) under the tongue every 5 (five) minutes as needed for chest pain.   PATADAY 0.1 % ophthalmic solution Place into both eyes as needed.    senna (SENOKOT) 8.6 MG tablet Take 1 tablet by mouth daily.   sertraline (ZOLOFT) 100 MG tablet Take 1.5 tablets (150 mg total) by mouth daily.   spironolactone (ALDACTONE) 25 MG tablet TAKE 1/2 TABLET(12.5 MG) BY MOUTH DAILY     Allergies:   Ace inhibitors, Cephalexin, Crestor [rosuvastatin calcium], and Tape   Social History   Tobacco Use   Smoking status: Never Smoker   Smokeless tobacco: Never Used  Vaping Use   Vaping Use: Never used  Substance Use Topics   Alcohol use: Not Currently    Comment: past   Drug use: Never     Family Hx: The patient's family history includes Anxiety disorder in his sister.  ROS:   Please see the history of present illness.    Review of Systems  Constitutional: Negative.   HENT: Negative.   Respiratory: Negative.   Cardiovascular: Negative.   Gastrointestinal: Negative.   Musculoskeletal: Negative.        Leg weakness  Neurological: Negative.   Psychiatric/Behavioral: Positive for depression.  All other systems reviewed and are negative.    Labs/Other Tests and Data Reviewed:    Recent Labs: 10/12/2019: ALT 14; BUN 17; Creat 0.99; Hemoglobin 13.6; Platelets 228; Potassium 3.7; Sodium 142 12/12/2019: TSH 7.25   Recent Lipid Panel Lab Results  Component Value Date/Time   CHOL 127 10/12/2019 08:10 AM   TRIG 123 10/12/2019 08:10 AM   HDL 41 10/12/2019 08:10 AM   CHOLHDL 3.1 10/12/2019 08:10 AM   LDLCALC 66 10/12/2019 08:10 AM    Wt Readings from Last 3 Encounters:  12/18/19 247  lb (112 kg)  10/17/19 248 lb (112.5 kg)  07/17/19 246 lb (111.6 kg)     Exam:    Vital Signs: Vital signs may also be detailed in the HPI BP 100/62 (BP Location: Left Arm, Patient Position: Sitting, Cuff Size: Large)    Pulse 71    Ht 5\' 8"  (1.727 m)    Wt 247 lb (112 kg)    SpO2 92%    BMI 37.56 kg/m    Well nourished, well developed male in no acute distress. Constitutional:  oriented to person, place, and time. No distress.  Head: Normocephalic and atraumatic.  Eyes:  no discharge. No scleral icterus.  Neck: Normal range of motion. Neck supple.  Pulmonary/Chest: No audible wheezing, no distress, appears comfortable Musculoskeletal: Normal range of motion.  no  tenderness or deformity.  Neurological:   Coordination normal. Full exam not performed Skin:  No rash Psychiatric:  normal mood and affect. behavior is normal. Thought content normal.    ASSESSMENT & PLAN:    Atherosclerosis of native coronary artery of native heart with stable angina pectoris (HCC) On aspirin Plavix (in light of prior strokes) Denies anginal symptoms, cholesterol and diabetes numbers at goal Lifestyle modification recommended, suggested walking program  Hx of CABG Currently with no symptoms of angina. No further workup at this time. Continue current medication regimen.  Mixed hyperlipidemia No recent lab work, numbers have been requested Goal LDL less than 70  Benign essential HTN Blood pressure is well controlled on today's visit. No changes made to the medications.  History of cerebrovascular accident (CVA) with residual deficit Still depressed, limited mobility Recommend he try to get out more and ambulate  Essential hypertension - Blood pressure is well controlled on today's visit. No changes made to the medications.   Total encounter time more than 25 minutes  Greater than 50% was spent in counseling and coordination of care with the patient   Signed, Julien Nordmann, MD  12/18/2019  6:02 PM    [facility] [address], [place], [place] [address]

## 2019-12-17 NOTE — Telephone Encounter (Signed)
  Chronic Care Management   Outreach Note  12/17/2019 Name: Luke Mckee MRN: [id number] DOB: 1941-01-02  Referred by: [name], DO Reason for referral : No chief complaint on file.   An unsuccessful telephone outreach was attempted today. The patient was referred to the case management team for assistance with care management and care coordination.   Follow Up Plan: CM Pharmacist will attempt to reach patient in the next 14 days.  Duanne Moron, PharmD, [facility] Clinical Pharmacist [facility] Care Management [phone]

## 2019-12-18 ENCOUNTER — Ambulatory Visit (INDEPENDENT_AMBULATORY_CARE_PROVIDER_SITE_OTHER): Payer: Medicare PPO | Admitting: Cardiovascular Disease

## 2019-12-18 ENCOUNTER — Encounter: Payer: Self-pay | Admitting: Cardiovascular Disease

## 2019-12-18 VITALS — BP 100/62 | HR 71 | Ht 68.0 in | Wt 247.0 lb

## 2019-12-18 DIAGNOSIS — E782 Mixed hyperlipidemia: Secondary | ICD-10-CM

## 2019-12-18 DIAGNOSIS — G459 Transient cerebral ischemic attack, unspecified: Secondary | ICD-10-CM

## 2019-12-18 DIAGNOSIS — Z951 Presence of aortocoronary bypass graft: Secondary | ICD-10-CM | POA: Diagnosis not present

## 2019-12-18 DIAGNOSIS — I10 Essential (primary) hypertension: Secondary | ICD-10-CM | POA: Diagnosis not present

## 2019-12-18 DIAGNOSIS — I63412 Cerebral infarction due to embolism of left middle cerebral artery: Secondary | ICD-10-CM | POA: Diagnosis not present

## 2019-12-18 DIAGNOSIS — I25118 Atherosclerotic heart disease of native coronary artery with other forms of angina pectoris: Secondary | ICD-10-CM | POA: Diagnosis not present

## 2019-12-18 DIAGNOSIS — I6930 Unspecified sequelae of cerebral infarction: Secondary | ICD-10-CM | POA: Diagnosis not present

## 2019-12-18 NOTE — Patient Instructions (Signed)
Medication Instructions:  No changes  If you need a refill on your cardiac medications before your next appointment, please call your pharmacy.    Lab work: No new labs needed   If you have labs (blood work) drawn today and your tests are completely normal, you will receive your results only by: . MyChart Message (if you have MyChart) OR . A paper copy in the mail If you have any lab test that is abnormal or we need to change your treatment, we will call you to review the results.   Testing/Procedures: No new testing needed   Follow-Up: At [facility], you and your health needs are our priority.  As part of our continuing mission to provide you with exceptional heart care, we have created designated Provider Care Teams.  These Care Teams include your primary Cardiologist (physician) and Advanced Practice Providers (APPs -  Physician Assistants and Nurse Practitioners) who all work together to provide you with the care you need, when you need it.  . You will need a follow up appointment in 6 months  . Providers on your designated Care Team:   . Christopher Berge, NP . Ryan Dunn, PA-C . Jacquelyn Visser, PA-C  Any Other Special Instructions Will Be Listed Below (If Applicable).  COVID-19 Vaccine Information can be found at: [email or web address] For questions related to vaccine distribution or appointments, please email [email or web address] or call [phone].     

## 2019-12-24 ENCOUNTER — Other Ambulatory Visit: Payer: Self-pay | Admitting: Family Medicine

## 2019-12-24 DIAGNOSIS — I25810 Atherosclerosis of coronary artery bypass graft(s) without angina pectoris: Secondary | ICD-10-CM

## 2019-12-25 ENCOUNTER — Ambulatory Visit: Payer: Medicare PPO | Admitting: Family Medicine

## 2019-12-28 ENCOUNTER — Ambulatory Visit: Payer: Medicare PPO | Admitting: Pharmacist

## 2019-12-28 DIAGNOSIS — I10 Essential (primary) hypertension: Secondary | ICD-10-CM

## 2019-12-28 DIAGNOSIS — E039 Hypothyroidism, unspecified: Secondary | ICD-10-CM

## 2019-12-28 NOTE — Patient Instructions (Signed)
Thank you allowing the Chronic Care Management Team to be a part of your care! It was a pleasure speaking with you today!     CCM (Chronic Care Management) Team    Alto Denver RN, MSN, CCM Nurse Care Coordinator  [phone]   Duanne Moron PharmD  Clinical Pharmacist  [phone]   Dickie La LCSW Clinical Social Worker [phone]  Visit Information  Goals Addressed            This Visit's Progress   . PharmD - Medication Management       Current Barriers:  . Chronic Disease Management support, education, and care coordination needs related to CAD, HTN, HLD, and hx CVA  Pharmacist Clinical Goal(s):  Marland Kitchen Over the next 30 days, patient will work with CM Pharmacist to address medication management needs  Interventions: . Perform chart review.  o Note thyroid labs completed on 12/1 Lab Results  Component Value Date   TSH 7.25 (H) 12/12/2019   - [name] levothyroxine dose increased on 12/6 to levothyroxine 125 mcg daily before breakfast o Note patient seen by Cardiologist on 12/7 . Follow up with wife/caregiver today regarding medication management o Reports patient feeling much better/has more energy since levothyroxine dose increased o Note patient's wife fills weekly pillbox o Address questions regarding navigating health plan coverage/pharmacy coverage  Note patient has medication benefit from both [facility] and [employer]  Reports has verified with pharmacy that prescriptions are being split billed to both [facility] and [employer] . Encourage to continug to check home blood pressure, keep log and bringing log with patient to his medical appointments  Patient Self Care Activities:  . Attends all scheduled provider appointments . Calls pharmacy for medication refills . Calls provider office for new concerns or questions  Please see past updates related to this goal by clicking on the "Past Updates" button in the selected goal         The patient  verbalized understanding of instructions, educational materials, and care plan provided today and declined offer to receive copy of patient instructions, educational materials, and care plan.   Telephone follow up appointment with care management team member scheduled for: 2/18 at 10:30 am  Duanne Moron, PharmD, [facility] Clinical Pharmacist [facility] [employer] [phone]

## 2019-12-28 NOTE — Chronic Care Management (AMB) (Signed)
Chronic Care Management   Follow Up Note   12/28/2019 Name: Luke Mckee MRN: [id number] DOB: [birth year]  Referred by: [name], DO Reason for referral : Chronic Care Management (Caregiver Phone Call)   Luke Mckee is a 79 y.o. year old male who is a primary care patient of [name], DO. The CCM team was consulted for assistance with chronic disease management and care coordination needs.    I reached out to patient's wife by phone today.  Review of patient status, including review of consultants reports, relevant laboratory and other test results, and collaboration with appropriate care team members and the patient's provider was performed as part of comprehensive patient evaluation and provision of chronic care management services.    SDOH (Social Determinants of Health) assessments performed: No See Care Plan activities for detailed interventions related to [facility])     Outpatient Encounter Medications as of 12/28/2019  Medication Sig  . acetaminophen (TYLENOL 8 HOUR) 650 MG CR tablet Take 1,300 mg by mouth at bedtime.   Marland Kitchen aspirin 81 MG chewable tablet Chew 81 mg by mouth daily.  Marland Kitchen atenolol (TENORMIN) 50 MG tablet Take 1 tablet (50 mg total) by mouth at bedtime.  Marland Kitchen atorvastatin (LIPITOR) 80 MG tablet Take 1 tablet (80 mg total) by mouth daily.  . cetirizine (ZYRTEC) 10 MG tablet Take 10 mg by mouth at bedtime.   . Cholecalciferol (VITAMIN D) 50 MCG (2000 UT) tablet Take 4,000 Units by mouth daily.  . clopidogrel (PLAVIX) 75 MG tablet Take 1 tablet (75 mg total) by mouth daily.  . diclofenac sodium (VOLTAREN) 1 % GEL Apply 2 g topically 4 (four) times daily as needed for pain. Foot pain  . docusate sodium (COLACE) 100 MG capsule Take 200 mg by mouth daily.   . furosemide (LASIX) 80 MG tablet Take 1 tablet (80 mg total) by mouth 2 (two) times daily.  Marland Kitchen ipratropium (ATROVENT) 0.06 % nasal spray USE 2 SPRAYS IN EACH NOSTRIL THREE TIMES DAILY AS NEEDED FOR  RHINITIS  . isosorbide mononitrate (IMDUR) 30 MG 24 hr tablet TAKE 1 TABLET(30 MG) BY MOUTH DAILY  . levothyroxine (SYNTHROID) 125 MCG tablet Take 1 tablet (125 mcg total) by mouth daily before breakfast.  . loratadine (CLARITIN) 10 MG tablet Take 10 mg by mouth daily.  Marland Kitchen LORazepam (ATIVAN) 0.5 MG tablet Take 1 tablet (0.5 mg total) by mouth daily as needed for anxiety or sleep.  . metroNIDAZOLE (METROCREAM) 0.75 % cream Apply topically in the morning and at bedtime.  . Multiple Vitamin (MULTIVITAMIN) tablet Take 1 tablet by mouth daily.  . nitroGLYCERIN (NITROSTAT) 0.4 MG SL tablet Place 1 tablet (0.4 mg total) under the tongue every 5 (five) minutes as needed for chest pain.  Marland Kitchen PATADAY 0.1 % ophthalmic solution Place into both eyes as needed.   . senna (SENOKOT) 8.6 MG tablet Take 1 tablet by mouth daily.  . sertraline (ZOLOFT) 100 MG tablet Take 1.5 tablets (150 mg total) by mouth daily.  Marland Kitchen spironolactone (ALDACTONE) 25 MG tablet TAKE 1/2 TABLET(12.5 MG) BY MOUTH DAILY   No facility-administered encounter medications on file as of 12/28/2019.    Goals Addressed            This Visit's Progress   . PharmD - Medication Management       Current Barriers:  . Chronic Disease Management support, education, and care coordination needs related to CAD, HTN, HLD, and hx CVA  Pharmacist Clinical Goal(s):  Marland Kitchen Over  the next 30 days, patient will work with CM Pharmacist to address medication management needs  Interventions: . Perform chart review.  o Note thyroid labs completed on 12/1 Lab Results  Component Value Date   TSH 7.25 (H) 12/12/2019   - [name] levothyroxine dose increased on 12/6 to levothyroxine 125 mcg daily before breakfast o Note patient seen by Cardiologist on 12/7 . Follow up with wife/caregiver today regarding medication management o Reports patient feeling much better/has more energy since levothyroxine dose increased o Note patient's wife fills weekly  pillbox o Address questions regarding navigating health plan coverage/pharmacy coverage  Note patient has medication benefit from both [facility] and [employer]  Reports has verified with pharmacy that prescriptions are being split billed to both [facility] and [employer] . Encourage to continug to check home blood pressure, keep log and bringing log with patient to his medical appointments  Patient Self Care Activities:  . Attends all scheduled provider appointments . Calls pharmacy for medication refills . Calls provider office for new concerns or questions  Please see past updates related to this goal by clicking on the "Past Updates" button in the selected goal         Plan  Telephone follow up appointment with care management team member scheduled for: 2/18 at 10:30 am  Duanne Moron, PharmD, [facility] Clinical Pharmacist [facility] [phone]

## 2019-12-31 ENCOUNTER — Telehealth: Payer: Self-pay | Admitting: General Practice

## 2019-12-31 ENCOUNTER — Telehealth: Payer: Medicare PPO

## 2019-12-31 DIAGNOSIS — G4733 Obstructive sleep apnea (adult) (pediatric): Secondary | ICD-10-CM | POA: Diagnosis not present

## 2019-12-31 NOTE — Telephone Encounter (Signed)
°  Chronic Care Management   Outreach Note  12/31/2019 Name: Unknown Schleyer MRN: [id number] DOB: 10-23-1940  Referred by: [name], DO Reason for referral : Appointment (RNCM Follow up call for Chronic Disease Management and Care Coordination Needs)   Asbury Hair is enrolled in a Managed Medicaid Health Plan: No  An unsuccessful telephone outreach was attempted today. The patient was referred to the case management team for assistance with care management and care coordination.   Follow Up Plan: A HIPAA compliant phone message was left for the patient providing contact information and requesting a return call.   Alto Denver RN, MSN, CCM Community Care Coordinator [place]   [facility] [place] Mobile: [phone]

## 2020-01-03 ENCOUNTER — Telehealth: Payer: Self-pay

## 2020-01-03 NOTE — Chronic Care Management (AMB) (Signed)
  Care Management   Note  01/03/2020 Name: Javen Hinderliter MRN: [id number] DOB: 1940-04-28  Jayton Popelka is a 79 y.o. year old male who is a primary care patient of [name], DO and is actively engaged with the care management team. I reached out to [name] by phone today to assist with re-scheduling a follow up visit with the RN Case Manager  Follow up plan: Unsuccessful telephone outreach attempt made. A HIPAA compliant phone message was left for the patient providing contact information and requesting a return call.  The care management team will reach out to the patient again over the next 7 days.  If patient returns call to provider office, please advise to call Embedded Care Management Care Guide Penne Lash  at [phone]  Penne Lash, RMA Care Guide, Embedded Care Coordination [facility]  [place], [place] [address] Direct Dial: [phone] [email or web address]: [email or web address]

## 2020-01-06 LAB — CUP PACEART REMOTE DEVICE CHECK
Date Time Interrogation Session: 20211224232011
Implantable Pulse Generator Implant Date: 20200714

## 2020-01-07 ENCOUNTER — Ambulatory Visit (INDEPENDENT_AMBULATORY_CARE_PROVIDER_SITE_OTHER): Payer: Medicare PPO

## 2020-01-07 DIAGNOSIS — I63412 Cerebral infarction due to embolism of left middle cerebral artery: Secondary | ICD-10-CM

## 2020-01-09 NOTE — Telephone Encounter (Signed)
Patient has been r/s  

## 2020-01-09 NOTE — Chronic Care Management (AMB) (Signed)
  Care Management   Note  01/09/2020 Name: Luke Mckee MRN: [id number] DOB: 09-11-1940  [name] is a 79 y.o. year old male who is a primary care patient of [name], DO and is actively engaged with the care management team. I reached out to [name] by phone today to assist with re-scheduling a follow up visit with the RN Case Manager  Follow up plan: Telephone appointment with care management team member scheduled for:01/21/2020  Penne Lash, RMA Care Guide, Embedded Care Coordination [facility]  [place], [place] [address] Direct Dial: [phone] [name]: [email or web address]

## 2020-01-21 ENCOUNTER — Telehealth: Payer: Medicare PPO

## 2020-01-21 NOTE — Progress Notes (Signed)
Carelink Summary Report / Loop Recorder 

## 2020-01-24 ENCOUNTER — Ambulatory Visit: Payer: Self-pay | Admitting: General Practice

## 2020-01-24 ENCOUNTER — Telehealth: Payer: Medicare PPO | Admitting: General Practice

## 2020-01-24 DIAGNOSIS — E782 Mixed hyperlipidemia: Secondary | ICD-10-CM

## 2020-01-24 DIAGNOSIS — I5022 Chronic systolic (congestive) heart failure: Secondary | ICD-10-CM

## 2020-01-24 DIAGNOSIS — I10 Essential (primary) hypertension: Secondary | ICD-10-CM

## 2020-01-24 NOTE — Chronic Care Management (AMB) (Signed)
°Chronic Care Management  ° °CCM RN Visit Note ° °01/24/2020 °Name: Luke Mckee MRN: [id number] DOB: 04/13/1940 ° °Subjective: °Luke Mckee is a 79 y.o. year old male who is a primary care patient of Karamalegos, Alexander J, DO. The care management team was consulted for assistance with disease management and care coordination needs.   ° °Engaged with patient by telephone for follow up visit in response to provider referral for case management and/or care coordination services.  ° °Consent to Services:  °patient currently engage in CCM services ° °Patient agreed to services and verbal consent obtained.  ° °Assessment: Review of patient past medical history, allergies, medications, health status, including review of consultants reports, laboratory and other test data, was performed as part of comprehensive evaluation and provision of chronic care management services.  ° °SDOH (Social Determinants of Health) assessments and interventions performed:   ° °CCM Care Plan ° °Allergies  °Allergen Reactions  °• Ace Inhibitors   °• Cephalexin   °  Vomiting and diarrhea  °• Crestor [Rosuvastatin Calcium] Other (See Comments)  °  myalgia  °• Tape Rash  °  blistering  ° ° °Outpatient Encounter Medications as of 01/24/2020  °Medication Sig  °• acetaminophen (TYLENOL 8 HOUR) 650 MG CR tablet Take 1,300 mg by mouth at bedtime.   °• aspirin 81 MG chewable tablet Chew 81 mg by mouth daily.  °• atenolol (TENORMIN) 50 MG tablet Take 1 tablet (50 mg total) by mouth at bedtime.  °• atorvastatin (LIPITOR) 80 MG tablet Take 1 tablet (80 mg total) by mouth daily.  °• cetirizine (ZYRTEC) 10 MG tablet Take 10 mg by mouth at bedtime.   °• Cholecalciferol (VITAMIN D) 50 MCG (2000 UT) tablet Take 4,000 Units by mouth daily.  °• clopidogrel (PLAVIX) 75 MG tablet Take 1 tablet (75 mg total) by mouth daily.  °• diclofenac sodium (VOLTAREN) 1 % GEL Apply 2 g topically 4 (four) times daily as needed for pain. Foot pain  °• docusate sodium (COLACE) 100  MG capsule Take 200 mg by mouth daily.   °• furosemide (LASIX) 80 MG tablet Take 1 tablet (80 mg total) by mouth 2 (two) times daily.  °• ipratropium (ATROVENT) 0.06 % nasal spray USE 2 SPRAYS IN EACH NOSTRIL THREE TIMES DAILY AS NEEDED FOR RHINITIS  °• isosorbide mononitrate (IMDUR) 30 MG 24 hr tablet TAKE 1 TABLET(30 MG) BY MOUTH DAILY  °• levothyroxine (SYNTHROID) 125 MCG tablet Take 1 tablet (125 mcg total) by mouth daily before breakfast.  °• loratadine (CLARITIN) 10 MG tablet Take 10 mg by mouth daily.  °• LORazepam (ATIVAN) 0.5 MG tablet Take 1 tablet (0.5 mg total) by mouth daily as needed for anxiety or sleep.  °• metroNIDAZOLE (METROCREAM) 0.75 % cream Apply topically in the morning and at bedtime.  °• Multiple Vitamin (MULTIVITAMIN) tablet Take 1 tablet by mouth daily.  °• nitroGLYCERIN (NITROSTAT) 0.4 MG SL tablet Place 1 tablet (0.4 mg total) under the tongue every 5 (five) minutes as needed for chest pain.  °• PATADAY 0.1 % ophthalmic solution Place into both eyes as needed.   °• senna (SENOKOT) 8.6 MG tablet Take 1 tablet by mouth daily.  °• sertraline (ZOLOFT) 100 MG tablet Take 1.5 tablets (150 mg total) by mouth daily.  °• spironolactone (ALDACTONE) 25 MG tablet TAKE 1/2 TABLET(12.5 MG) BY MOUTH DAILY  ° °No facility-administered encounter medications on file as of 01/24/2020.  ° ° °Patient Active Problem List  ° Diagnosis Date Noted  °•   Hallux limitus, acquired, right 11/12/2019  °• Pre-diabetes 10/17/2019  °• Recurrent major depressive disorder, in partial remission (HCC) 04/16/2019  °• Morbid obesity (HCC) 04/16/2019  °• Coronary artery disease involving coronary bypass graft of native heart without angina pectoris 04/16/2019  °• Vasomotor rhinitis 10/25/2018  °• Hearing loss 08/28/2018  °• Osteoarthritis 08/28/2018  °• Tinnitus 08/28/2018  °• 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 (HCC) 07/31/2018  °• Stroke (HCC) 07/24/2018  °• TIA (transient ischemic attack)  07/23/2018  °• Slurred speech 07/23/2018  °• Right arm weakness 07/23/2018  °• Hypokalemia 07/23/2018  °• Anxiety 07/20/2018  °• Hx of CABG 06/16/2018  °• Mixed hyperlipidemia 06/16/2018  °• Benign essential HTN 06/16/2018  °• History of cerebrovascular accident (CVA) with residual deficit 06/07/2018  °• Systolic CHF (HCC) 02/15/2018  °• Hypertension 02/15/2018  °• S/P total knee arthroplasty, right 02/15/2018  °• Primary osteoarthritis of both knees 11/25/2017  °• Abnormal nuclear stress test 06/30/2016  °• Hallux limitus of right foot 10/22/2015  °• Anesthesia complication 10/10/2015  °• GERD (gastroesophageal reflux disease) 10/10/2015  °• Hypothyroidism 10/10/2015  °• OSA on CPAP 01/10/2007  °• Diagnosis unknown 01/12/2003  ° ° °Conditions to be addressed/monitored:CHF, HTN and HLD ° °Care Plan : RNCM: Heart Failure (Adult)  °Updates made by Tate, Pamela J since 01/24/2020 12:00 AM  °  °Problem: RNCM: Symptom Exacerbation (Heart Failure)   °Priority: Medium  °  °Goal: RNCM: Symptom Exacerbation Prevented or Minimized   °Priority: Medium  °Note:   °Current Barriers:  °• Knowledge deficits related to basic heart failure pathophysiology and self care management °• Unable to independently manage HF °• Lacks social connections °• Does not contact provider office for questions/concerns °• Lack of scale in home °• Financial strain ° °Nurse Case Manager Clinical Goal(s):  °· Over the next 120 days, patient will weigh self daily and record °· Over the next 120 days, patient will verbalize understanding of Heart Failure Action Plan and when to call doctor °· Over the next 120 days, patient will take all Heart Failure mediations as prescribed °Interventions:  °• Collaboration with Karamalegos, Alexander J, DO regarding development and update of comprehensive plan of care as evidenced by provider attestation and co-signature °• Inter-disciplinary care team collaboration (see longitudinal plan of care) °• Basic overview and  discussion of pathophysiology of Heart Failure °• Provided written and verbal education on low sodium diet °• Reviewed Heart Failure Action Plan in depth and provided written copy °• Assessed for scales in home °• Discussed importance of daily weight °• Reviewed role of diuretics in prevention of fluid overload ° °Patient Goals/Self-Care Activities °• Over the next 120 days, patient will:  °- Take Heart Failure Medications as prescribed °- Weigh daily and record (notify MD with 3 lb weight gain over night or 5 lb in a week) °- Follow CHF Action Plan °- Adhere to low sodium diet °- barriers to lifestyle changes reviewed and addressed °- barriers to treatment reviewed and addressed °- cognitive screening completed and reviewed °- depression screen reviewed °- health literacy screening completed or reviewed °- rescue (action) plan reviewed °- self-awareness of signs/symptoms of worsening disease encouraged ° °Follow Up Plan: Telephone follow up appointment with care management team member scheduled for: 03-13-2020 at 1 pm °  °Task: RNCM: Identify and Minimize Risk of Heart Failure Exacerbation   °Note:   °Care Management Activities:  °  °- barriers to lifestyle changes reviewed and addressed °- barriers to treatment reviewed   and addressed °- cognitive screening completed and reviewed °- depression screen reviewed °- health literacy screening completed or reviewed °- rescue (action) plan reviewed °- self-awareness of signs/symptoms of worsening disease encouraged  °  ° °  °Care Plan : RNCM: Management of HTN  °Updates made by Tate, Pamela J since 01/24/2020 12:00 AM  °  °Problem: RNCM: Hypertension (Hypertension)   °Priority: Medium  °  °Goal: RNCM: Hypertension Monitored   °Priority: Medium  °Note:   °Objective:  °• Last practice recorded BP readings:  °BP Readings from Last 3 Encounters:  °12/18/19 100/62  °10/17/19 118/70  °07/17/19 132/72 •  ° °• Most recent eGFR/CrCl: No results found for: EGFR  No components found  for: CRCL °Current Barriers:  °• Knowledge Deficits related to basic understanding of hypertension pathophysiology and self care management °• Knowledge Deficits related to understanding of medications prescribed for management of hypertension °• Limited Social Support °• Unable to independently manage HTN °• Does not contact provider office for questions/concerns °Case Manager Clinical Goal(s):  °• Over the next 120 days, patient will verbalize understanding of plan for hypertension management °• Over the next 120 days, patient will attend all scheduled medical appointments: 04-16-2020 at 11 am °• Over the next 120 days, patient will demonstrate improved adherence to prescribed treatment plan for hypertension as evidenced by taking all medications as prescribed, monitoring and recording blood pressure as directed, adhering to low sodium/DASH diet °• Over the next 120 days, patient will demonstrate improved health management independence as evidenced by checking blood pressure as directed and notifying PCP if SBP>160 or DBP > 90, taking all medications as prescribe, and adhering to a low sodium diet as discussed. °• Over the next 120 days, patient will verbalize basic understanding of hypertension disease process and self health management plan as evidenced by compliance with medications, heart healthy diet, and working with the CCM team to optimize health and well being  °Interventions:  °• Collaboration with Karamalegos, Alexander J, DO regarding development and update of comprehensive plan of care as evidenced by provider attestation and co-signature °• Inter-disciplinary care team collaboration (see longitudinal plan of care) °• Evaluation of current treatment plan related to hypertension self management and patient's adherence to plan as established by provider. °• Provided education to patient re: stroke prevention, s/s of heart attack and stroke, DASH diet, complications of uncontrolled blood  pressure °• Reviewed medications with patient and discussed importance of compliance °• Discussed plans with patient for ongoing care management follow up and provided patient with direct contact information for care management team °• Advised patient, providing education and rationale, to monitor blood pressure daily and record, calling PCP for findings outside established parameters.  °• Reviewed scheduled/upcoming provider appointments including: 04-16-2020 at 11 am °Patient Goals/Self-Care Activities °• Over the next 120 days, patient will:  °- UNABLE to independently manage HTN °Self administers medications as prescribed °Attends all scheduled provider appointments °Calls provider office for new concerns, questions, or BP outside discussed parameters °Checks BP and records as discussed °Follows a low sodium diet/DASH diet °- blood pressure equipment and technique reviewed °- blood pressure trends reviewed °- depression screen reviewed °- home or ambulatory blood pressure monitoring encouraged ° °Follow Up Plan: Telephone follow up appointment with care management team member scheduled for: 03-13-2020 at 1 pm °  °Task: RNCM: Identify and Monitor Blood Pressure Elevation   °Note:   °Care Management Activities:  °  °- blood pressure equipment and technique reviewed °- blood pressure   trends reviewed - depression screen reviewed - home or ambulatory blood pressure monitoring encouraged       Care Plan : RNCM: Management of HLD  Updates made by Vanita Ingles since 01/24/2020 12:00 AM    Problem: RNCM: [place] or Disease Self-Management (General Plan of Care)   Priority: Medium    Goal: RNCM;HLD Self-Management Plan Developed   Priority: Medium  Note:   Current Barriers:   Poorly controlled hyperlipidemia, complicated by OSA,HTN, CAD, HLD, CVA  Current antihyperlipidemic regimen: Atorvastatin 80 mg QD  Most recent lipid panel:     Component Value Date/Time   CHOL 127 10/12/2019 0810    TRIG 123 10/12/2019 0810   HDL 41 10/12/2019 0810   CHOLHDL 3.1 10/12/2019 0810   VLDL 25 07/24/2018 0458   LDLCALC 66 10/12/2019 0810     ASCVD risk enhancing conditions: age >68, DM, HTN, CKD, CHF, current smoker  Unable to independently manage HLD  Lacks social connections  Does not contact provider office for questions/concerns  RN Care Manager Clinical Goal(s):   Over the next 120 days, patient will work with Consulting civil engineer, providers, and care team towards execution of optimized self-health management plan  Over the next 120 days, patient will verbalize understanding of plan for HLD  Over the next 120 days, patient will work with [facility] and pcp to address needs related to HLD  Over the next 120 days, patient will attend all scheduled medical appointments: 04-16-2020 at 11 am  Interventions:  Collaboration with Olin Hauser, DO regarding development and update of comprehensive plan of care as evidenced by provider attestation and co-signature  Inter-disciplinary care team collaboration (see longitudinal plan of care)  Medication review performed; medication list updated in electronic medical record.   Inter-disciplinary care team collaboration (see longitudinal plan of care)  Referred to pharmacy team for assistance with HLD medication management  Evaluation of current treatment plan related to HLD and patient's adherence to plan as established by provider.  Advised patient to call the office for changes in condition or questions about chronic conditions   Provided education to patient re: Heart Healthy Diet and managing weight   Reviewed medications with patient and discussed compliance   Discussed plans with patient for ongoing care management follow up and provided patient with direct contact information for care management team  Reviewed scheduled/upcoming provider appointments including: 04-16-2020 11 am   Patient Goals/Self-Care Activities:  Over  the next 120 days, patient will:   - call for medicine refill 2 or 3 days before it runs out - call if I am sick and can't take my medicine - keep a list of all the medicines I take; vitamins and herbals too - learn to read medicine labels - use a pillbox to sort medicine - use an alarm clock or phone to remind me to take my medicine - change to whole grain breads, cereal, pasta - drink 6 to 8 glasses of water each day - eat 5 or 6 small meals each day - limit fast food meals to no more than 1 per week - prepare main meal at home 3 to 5 days each week - read food labels for fat, fiber, carbohydrates and portion size - be open to making changes - I can manage, know and watch for signs of a heart attack - if I have chest pain, call for help - learn about small changes that will make a big difference - learn my personal  risk factors - barriers to meeting goals identified - choices provided - collaboration with team encouraged - decision-making supported - difficulty of making life-long changes acknowledged - health risks reviewed - problem-solving facilitated - questions answered - readiness for change evaluated - reassurance provided - resources needed to meet goals identified - self-reflection promoted - self-reliance encouraged   Follow Up Plan: Telephone follow up appointment with care management team member scheduled for: 03-13-2020 at 1 pm     Task: RNCM: HLD Mutually Develop and Royce Macadamia Achievement of Patient Goals   Note:   Care Management Activities:    - barriers to meeting goals identified - choices provided - collaboration with team encouraged - decision-making supported - difficulty of making life-long changes acknowledged - health risks reviewed - problem-solving facilitated - questions answered - readiness for change evaluated - reassurance provided - resources needed to meet goals identified - self-reflection promoted - self-reliance encouraged          Plan:Telephone follow up appointment with care management team member scheduled for:  03-13-2020 at 1 pm   [place], MSN, [place] Mobile: [phone]

## 2020-01-24 NOTE — Patient Instructions (Signed)
Visit Information  Patient Care Plan: RNCM: Heart Failure (Adult)    Problem Identified: RNCM: Symptom Exacerbation (Heart Failure)   Priority: Medium    Goal: RNCM: Symptom Exacerbation Prevented or Minimized   Priority: Medium  Note:   Current Barriers:  Marland Kitchen Knowledge deficits related to basic heart failure pathophysiology and self care management . Unable to independently manage HF . Lacks social connections . Does not contact provider office for questions/concerns . Lack of scale in home . Financial strain  Occupational hygienist):   Over the next 120 days, patient will weigh self daily and record  Over the next 120 days, patient will verbalize understanding of Heart Failure Action Plan and when to call doctor  Over the next 120 days, patient will take all Heart Failure mediations as prescribed Interventions:  . Collaboration with Olin Hauser, DO regarding development and update of comprehensive plan of care as evidenced by provider attestation and co-signature . Inter-disciplinary care team collaboration (see longitudinal plan of care) . Basic overview and discussion of pathophysiology of Heart Failure . Provided written and verbal education on low sodium diet . Reviewed Heart Failure Action Plan in depth and provided written copy . Assessed for scales in home . Discussed importance of daily weight . Reviewed role of diuretics in prevention of fluid overload  Patient Goals/Self-Care Activities . Over the next 120 days, patient will:  - Take Heart Failure Medications as prescribed - Weigh daily and record (notify MD with 3 lb weight gain over night or 5 lb in a week) - Follow CHF Action Plan - Adhere to low sodium diet - barriers to lifestyle changes reviewed and addressed - barriers to treatment reviewed and addressed - cognitive screening completed and reviewed - depression screen reviewed - health literacy screening completed or reviewed -  rescue (action) plan reviewed - self-awareness of signs/symptoms of worsening disease encouraged  Follow Up Plan: Telephone follow up appointment with care management team member scheduled for: 03-13-2020 at 1 pm   Task: RNCM: Identify and Minimize Risk of Heart Failure Exacerbation   Note:   Care Management Activities:    - barriers to lifestyle changes reviewed and addressed - barriers to treatment reviewed and addressed - cognitive screening completed and reviewed - depression screen reviewed - health literacy screening completed or reviewed - rescue (action) plan reviewed - self-awareness of signs/symptoms of worsening disease encouraged       Patient Care Plan: RNCM: Management of HTN    Problem Identified: RNCM: Hypertension (Hypertension)   Priority: Medium    Goal: RNCM: Hypertension Monitored   Priority: Medium  Note:   Objective:  . Last practice recorded BP readings:  BP Readings from Last 3 Encounters:  12/18/19 100/62  10/17/19 118/70  07/17/19 132/72 .   Marland Kitchen Most recent eGFR/CrCl: No results found for: EGFR  No components found for: CRCL Current Barriers:  Marland Kitchen Knowledge Deficits related to basic understanding of hypertension pathophysiology and self care management . Knowledge Deficits related to understanding of medications prescribed for management of hypertension . Limited Social Support . Unable to independently manage HTN . Does not contact provider office for questions/concerns Case Manager Clinical Goal(s):  Marland Kitchen Over the next 120 days, patient will verbalize understanding of plan for hypertension management . Over the next 120 days, patient will attend all scheduled medical appointments: 04-16-2020 at 11 am . Over the next 120 days, patient will demonstrate improved adherence to prescribed treatment plan for hypertension as evidenced by  taking all medications as prescribed, monitoring and recording blood pressure as directed, adhering to low sodium/DASH  diet . Over the next 120 days, patient will demonstrate improved health management independence as evidenced by checking blood pressure as directed and notifying PCP if SBP>160 or DBP > 90, taking all medications as prescribe, and adhering to a low sodium diet as discussed. . Over the next 120 days, patient will verbalize basic understanding of hypertension disease process and self health management plan as evidenced by compliance with medications, heart healthy diet, and working with the CCM team to optimize health and well being  Interventions:  . Collaboration with Olin Hauser, DO regarding development and update of comprehensive plan of care as evidenced by provider attestation and co-signature . Inter-disciplinary care team collaboration (see longitudinal plan of care) . Evaluation of current treatment plan related to hypertension self management and patient's adherence to plan as established by provider. . Provided education to patient re: stroke prevention, s/s of heart attack and stroke, DASH diet, complications of uncontrolled blood pressure . Reviewed medications with patient and discussed importance of compliance . Discussed plans with patient for ongoing care management follow up and provided patient with direct contact information for care management team . Advised patient, providing education and rationale, to monitor blood pressure daily and record, calling PCP for findings outside established parameters.  . Reviewed scheduled/upcoming provider appointments including: 04-16-2020 at 11 am Patient Goals/Self-Care Activities . Over the next 120 days, patient will:  - UNABLE to independently manage HTN Self administers medications as prescribed Attends all scheduled provider appointments Calls provider office for new concerns, questions, or BP outside discussed parameters Checks BP and records as discussed Follows a low sodium diet/DASH diet - blood pressure equipment and  technique reviewed - blood pressure trends reviewed - depression screen reviewed - home or ambulatory blood pressure monitoring encouraged  Follow Up Plan: Telephone follow up appointment with care management team member scheduled for: 03-13-2020 at 1 pm   Task: RNCM: Identify and Monitor Blood Pressure Elevation   Note:   Care Management Activities:    - blood pressure equipment and technique reviewed - blood pressure trends reviewed - depression screen reviewed - home or ambulatory blood pressure monitoring encouraged       Patient Care Plan: RNCM: Management of HLD    Problem Identified: RNCM: HLD Health Promotion or Disease Self-Management (General Plan of Care)   Priority: Medium    Goal: RNCM;HLD Self-Management Plan Developed   Priority: Medium  Note:   Current Barriers:  . Poorly controlled hyperlipidemia, complicated by OSA,HTN, CAD, HLD, CVA . Current antihyperlipidemic regimen: Atorvastatin 80 mg QD . Most recent lipid panel:     Component Value Date/Time   CHOL 127 10/12/2019 0810   TRIG 123 10/12/2019 0810   HDL 41 10/12/2019 0810   CHOLHDL 3.1 10/12/2019 0810   VLDL 25 07/24/2018 0458   LDLCALC 66 10/12/2019 0810 .   Marland Kitchen ASCVD risk enhancing conditions: age >44, DM, HTN, CKD, CHF, current smoker . Unable to independently manage HLD . Lacks social connections . Does not contact provider office for questions/concerns  RN Care Manager Clinical Goal(s):  Marland Kitchen Over the next 120 days, patient will work with Consulting civil engineer, providers, and care team towards execution of optimized self-health management plan . Over the next 120 days, patient will verbalize understanding of plan for HLD . Over the next 120 days, patient will work with [facility] and pcp to address needs related to  HLD . Over the next 120 days, patient will attend all scheduled medical appointments: 04-16-2020 at 11 am  Interventions: . Collaboration with Olin Hauser, DO regarding development and  update of comprehensive plan of care as evidenced by provider attestation and co-signature . Inter-disciplinary care team collaboration (see longitudinal plan of care) . Medication review performed; medication list updated in electronic medical record.  Bertram Savin care team collaboration (see longitudinal plan of care) . Referred to pharmacy team for assistance with HLD medication management . Evaluation of current treatment plan related to HLD and patient's adherence to plan as established by provider. . Advised patient to call the office for changes in condition or questions about chronic conditions  . Provided education to patient re: Heart Healthy Diet and managing weight  . Reviewed medications with patient and discussed compliance  . Discussed plans with patient for ongoing care management follow up and provided patient with direct contact information for care management team . Reviewed scheduled/upcoming provider appointments including: 04-16-2020 11 am   Patient Goals/Self-Care Activities: . Over the next 120 days, patient will:   - call for medicine refill 2 or 3 days before it runs out - call if I am sick and can't take my medicine - keep a list of all the medicines I take; vitamins and herbals too - learn to read medicine labels - use a pillbox to sort medicine - use an alarm clock or phone to remind me to take my medicine - change to whole grain breads, cereal, pasta - drink 6 to 8 glasses of water each day - eat 5 or 6 small meals each day - limit fast food meals to no more than 1 per week - prepare main meal at home 3 to 5 days each week - read food labels for fat, fiber, carbohydrates and portion size - be open to making changes - I can manage, know and watch for signs of a heart attack - if I have chest pain, call for help - learn about small changes that will make a big difference - learn my personal risk factors - barriers to meeting goals identified -  choices provided - collaboration with team encouraged - decision-making supported - difficulty of making life-long changes acknowledged - health risks reviewed - problem-solving facilitated - questions answered - readiness for change evaluated - reassurance provided - resources needed to meet goals identified - self-reflection promoted - self-reliance encouraged   Follow Up Plan: Telephone follow up appointment with care management team member scheduled for: 03-13-2020 at 1 pm     Task: RNCM: HLD Mutually Develop and Royce Macadamia Achievement of Patient Goals   Note:   Care Management Activities:    - barriers to meeting goals identified - choices provided - collaboration with team encouraged - decision-making supported - difficulty of making life-long changes acknowledged - health risks reviewed - problem-solving facilitated - questions answered - readiness for change evaluated - reassurance provided - resources needed to meet goals identified - self-reflection promoted - self-reliance encouraged         The patient verbalized understanding of instructions, educational materials, and care plan provided today and declined offer to receive copy of patient instructions, educational materials, and care plan.   Telephone follow up appointment with care management team member scheduled for: 03-13-2020 at 1 pm  Noreene Larsson RN, MSN, [place] Mobile: [phone]

## 2020-02-04 ENCOUNTER — Ambulatory Visit: Payer: Self-pay | Admitting: Pharmacist

## 2020-02-04 DIAGNOSIS — E039 Hypothyroidism, unspecified: Secondary | ICD-10-CM

## 2020-02-04 DIAGNOSIS — I10 Essential (primary) hypertension: Secondary | ICD-10-CM

## 2020-02-04 NOTE — Patient Instructions (Signed)
Visit Information  Please remember to: - take levothyroxine consistently in the morning on an empty stomach, at least 30 minutes before food  - separate levothyroxine by at least 4 hours from calcium- or iron-containing products.   The patient verbalized understanding of instructions, educational materials, and care plan provided today and declined offer to receive copy of patient instructions, educational materials, and care plan.   Telephone follow up appointment with care management team member scheduled for: 2/18 at 10:30 am  Duanne Moron, PharmD, [facility] Clinical Pharmacist [facility] [phone]

## 2020-02-04 NOTE — Chronic Care Management (AMB) (Signed)
Chronic Care Management Pharmacy Note  02/04/2020 Name:  Luke Mckee MRN:  [id number] DOB:  20-Apr-1940  Subjective: Luke Mckee is an 80 y.o. year old male who is a primary patient of [name], DO.  The CCM team was consulted for assistance with disease management and care coordination needs.    Receive message from [facility] Nurse Case Manager requesting call back to patient's wife regarding questions about patient's levothyroxine.  Engaged with patient's wife by telephone for follow up visit in response to provider referral for pharmacy case management and/or care coordination services.   Consent to Services:  The patient was given information about Chronic Care Management services, agreed to services, and gave verbal consent prior to initiation of services.  Please see initial visit note for detailed documentation.   Objective:  Lab Results  Component Value Date   CREATININE 0.99 10/12/2019   CREATININE 0.81 08/03/2018   CREATININE 0.84 07/25/2018    Lab Results  Component Value Date   HGBA1C 6.1 (H) 10/12/2019       Component Value Date/Time   CHOL 127 10/12/2019 0810   TRIG 123 10/12/2019 0810   HDL 41 10/12/2019 0810   CHOLHDL 3.1 10/12/2019 0810   VLDL 25 07/24/2018 0458   LDLCALC 66 10/12/2019 0810    Lab Results  Component Value Date   TSH 7.25 (H) 12/12/2019    BP Readings from Last 3 Encounters:  12/18/19 100/62  10/17/19 118/70  07/17/19 132/72    Assessment: Review of patient past medical history, allergies, medications, health status, including review of consultants reports, laboratory and other test data, was performed as part of comprehensive evaluation and provision of chronic care management services.   SDOH:  (Social Determinants of Health) assessments and interventions performed: None   CCM Care Plan  Allergies  Allergen Reactions  . Ace Inhibitors   . Cephalexin     Vomiting and diarrhea  . Crestor [Rosuvastatin Calcium]  Other (See Comments)    myalgia  . Tape Rash    blistering    Medications Reviewed Today    Reviewed by [name] on 01/24/20 at [phone]  Med List Status: <None>  Medication Order Taking? Sig Documenting Provider Last Dose Status Informant  acetaminophen (TYLENOL 8 HOUR) 650 MG CR tablet [id number] No Take 1,300 mg by mouth at bedtime.  [provider] Taking Active Spouse/Significant Other  aspirin 81 MG chewable tablet [id number] No Chew 81 mg by mouth daily. [provider] Taking Active   atenolol (TENORMIN) 50 MG tablet [id number] No Take 1 tablet (50 mg total) by mouth at bedtime. [name], DO Taking Active   atorvastatin (LIPITOR) 80 MG tablet [id number] No Take 1 tablet (80 mg total) by mouth daily. [name], DO Taking Active   cetirizine (ZYRTEC) 10 MG tablet [id number] No Take 10 mg by mouth at bedtime.  [provider] Taking Active Spouse/Significant Other  Cholecalciferol (VITAMIN D) 50 MCG (2000 UT) tablet [id number] No Take 4,000 Units by mouth daily. [provider] Taking Active Spouse/Significant Other  clopidogrel (PLAVIX) 75 MG tablet [id number] No Take 1 tablet (75 mg total) by mouth daily. [name], MD Taking Active Spouse/Significant Other  diclofenac sodium (VOLTAREN) 1 % GEL [id number] No Apply 2 g topically 4 (four) times daily as needed for pain. Foot pain [provider] Taking Active Spouse/Significant Other  docusate sodium (COLACE) 100 MG capsule [id number] No Take 200 mg by mouth daily.  [provider] Taking Active Spouse/Significant Other  furosemide (LASIX) 80 MG tablet [id number] No Take 1 tablet (80 mg total) by mouth 2 (two) times daily. [name], DO Taking Active   ipratropium (ATROVENT) 0.06 % nasal spray [id number] No USE 2 SPRAYS IN EACH NOSTRIL THREE TIMES DAILY AS NEEDED FOR RHINITIS [name] Netta [name], DO Taking Active   isosorbide mononitrate  (IMDUR) 30 MG 24 hr tablet [id number]  TAKE 1 TABLET(30 MG) BY MOUTH DAILY [name], Netta [name], DO  Active   levothyroxine (SYNTHROID) 125 MCG tablet [id number] No Take 1 tablet (125 mcg total) by mouth daily before breakfast. [name], DO Taking Active   loratadine (CLARITIN) 10 MG tablet [id number] No Take 10 mg by mouth daily. [provider] Taking Active   LORazepam (ATIVAN) 0.5 MG tablet [id number] No Take 1 tablet (0.5 mg total) by mouth daily as needed for anxiety or sleep. [name], DO Taking Active Spouse/Significant Other  metroNIDAZOLE (METROCREAM) 0.75 % cream [id number] No Apply topically in the morning and at bedtime. [name], MD Taking Active   Multiple Vitamin (MULTIVITAMIN) tablet [id number] No Take 1 tablet by mouth daily. [provider] Taking Active Spouse/Significant Other  nitroGLYCERIN (NITROSTAT) 0.4 MG SL tablet [id number] No Place 1 tablet (0.4 mg total) under the tongue every 5 (five) minutes as needed for chest pain. [name], DO Taking Active   PATADAY 0.1 % ophthalmic solution [id number] No Place into both eyes as needed.  [provider] Taking Active   senna (SENOKOT) 8.6 MG tablet [id number] No Take 1 tablet by mouth daily. [provider] Taking Active   sertraline (ZOLOFT) 100 MG tablet [id number] No Take 1.5 tablets (150 mg total) by mouth daily. [name], DO Taking Active   spironolactone (ALDACTONE) 25 MG tablet [id number] No TAKE 1/2 TABLET(12.5 MG) BY MOUTH DAILY [name] Netta [name], DO Taking Active           Patient Active Problem List   Diagnosis Date Noted  . Hallux limitus, acquired, right 11/12/2019  . Pre-diabetes 10/17/2019  . Recurrent major depressive disorder, in partial remission (HCC) 04/16/2019  . Morbid obesity (HCC) 04/16/2019  . Coronary artery disease involving coronary bypass graft of native heart without angina pectoris  04/16/2019  . Vasomotor rhinitis 10/25/2018  . Hearing loss 08/28/2018  . Osteoarthritis 08/28/2018  . Tinnitus 08/28/2018  . 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 (HCC) 07/31/2018  . Stroke (HCC) 07/24/2018  . TIA (transient ischemic attack) 07/23/2018  . Slurred speech 07/23/2018  . Right arm weakness 07/23/2018  . Hypokalemia 07/23/2018  . Anxiety 07/20/2018  . Hx of CABG 06/16/2018  . Mixed hyperlipidemia 06/16/2018  . Benign essential HTN 06/16/2018  . History of cerebrovascular accident (CVA) with residual deficit 06/07/2018  . Systolic CHF (HCC) 02/15/2018  . Hypertension 02/15/2018  . S/P total knee arthroplasty, right 02/15/2018  . Primary osteoarthritis of both knees 11/25/2017  . Abnormal nuclear stress test 06/30/2016  . Hallux limitus of right foot 10/22/2015  . Anesthesia complication 10/10/2015  . GERD (gastroesophageal reflux disease) 10/10/2015  . Hypothyroidism 10/10/2015  . OSA on CPAP 01/10/2007  . Diagnosis unknown 01/12/2003    Conditions to be addressed/monitored: HTN, HLD and hypothyroidism  Care Plan : PharmD - Med Management  Updates made by Daphane Shepherd, RPH since 02/04/2020 12:00 AM    Problem: Disease Progression     Long-Range Goal: Disease Progression Prevented or Minimized   Start  Date: 02/04/2020  Expected End Date: 05/04/2020  This Visit's Progress: On track  Priority: High  Note:   Current Barriers:  . Chronic Disease Management support, education, and care coordination needs related to CAD, HTN, HLD, and hx CVA .   Pharmacist Clinical Goal(s):  Marland Kitchen Over the next 90 days, patient will adhere to plan to optimize therapeutic regimen for hypothyroidism as evidenced by report of adherence to recommended medication management changes through collaboration with PharmD and provider.  .   Interventions: . 1:1 collaboration with [name], DO regarding development and update of comprehensive plan  of care as evidenced by provider attestation and co-signature . Inter-disciplinary care team collaboration (see longitudinal plan of care)  Hypothyroidism . Current treatment: levothyroxine 125 mcg daily before breakfast Lab Results  Component Value Date   TSH 7.25 (H) 12/12/2019 o  Note patient's levothyroxine dose increased on 12/6 to levothyroxine 125 mcg daily before breakfast . Receive message from [facility] Nurse Case Manager requesting call back to patient's wife regarding questions about levothyroxine . Today reach out to patient's wife's to address questions regarding levothyroxine.  o Again counsel on importance of patient taking levothyroxine consistently in the morning on an empty stomach, at least 30 minutes before food.  o Counsel on importance of separating levothyroxine by at least 4 hours from calcium- or iron-containing products.  - [name] wife reports that recently she has been administering calcium-containing multivitamin at the same time as levothyroxine, but will now adjust to separate by 4 hours  Hypertension . Encourage caregiver to continue to monitor home BP 1-2 times/week, keep log of results and call providers for readings outside of established parameters. Discuss plan to review home BP results during upcoming scheduled telephone appointment   Patient Goals/Self-Care Activities . Over the next 90 days, patient will:  - take medications as prescribed with assistance of wife   Note: patient uses weekly pillbox as filled by wife - check blood pressure, document, and provide at future appointments  Follow Up Plan: Telephone follow up appointment with care management team member scheduled for: 2/18 at 10:30 am      Follow Up:  [name] wife on behalf of patient agrees to Care Plan and Follow-up.  Duanne Moron, PharmD, [facility] Clinical Pharmacist [facility] [phone]

## 2020-02-09 LAB — CUP PACEART REMOTE DEVICE CHECK
Date Time Interrogation Session: 20220126232745
Implantable Pulse Generator Implant Date: 20200714

## 2020-02-11 ENCOUNTER — Ambulatory Visit (INDEPENDENT_AMBULATORY_CARE_PROVIDER_SITE_OTHER): Payer: Medicare PPO

## 2020-02-11 DIAGNOSIS — I63412 Cerebral infarction due to embolism of left middle cerebral artery: Secondary | ICD-10-CM | POA: Diagnosis not present

## 2020-02-18 ENCOUNTER — Other Ambulatory Visit: Payer: Self-pay | Admitting: Family Medicine

## 2020-02-18 ENCOUNTER — Ambulatory Visit: Payer: Medicare PPO | Admitting: Podiatry

## 2020-02-18 DIAGNOSIS — I10 Essential (primary) hypertension: Secondary | ICD-10-CM

## 2020-02-18 DIAGNOSIS — I25810 Atherosclerosis of coronary artery bypass graft(s) without angina pectoris: Secondary | ICD-10-CM

## 2020-02-19 NOTE — Progress Notes (Signed)
Carelink Summary Report / Loop Recorder 

## 2020-02-29 ENCOUNTER — Telehealth: Payer: Self-pay | Admitting: Family Medicine

## 2020-02-29 ENCOUNTER — Ambulatory Visit (INDEPENDENT_AMBULATORY_CARE_PROVIDER_SITE_OTHER): Payer: Medicare PPO | Admitting: Pharmacist

## 2020-02-29 DIAGNOSIS — I10 Essential (primary) hypertension: Secondary | ICD-10-CM | POA: Diagnosis not present

## 2020-02-29 DIAGNOSIS — E039 Hypothyroidism, unspecified: Secondary | ICD-10-CM

## 2020-02-29 NOTE — Patient Instructions (Signed)
Visit Information  PATIENT GOALS: Goals Addressed            This Visit's Progress   . Pharmacy Goals       It was great talking with you today!  Please check your blood pressure 1-2 times/week at home and keep a record to bring to medical appointments  Our goal bad cholesterol, or LDL, is less than 70 . This is why it is important to continue taking your atorvastatin  Feel free to call me with any questions or concerns. I look forward to our next call!  Duanne Moron, PharmD, BCACP Clinical Pharmacist [facility] [phone]         The patient verbalized understanding of instructions, educational materials, and care plan provided today and declined offer to receive copy of patient instructions, educational materials, and care plan.   Telephone follow up appointment with care management team member scheduled for: 5/18 at 11:15 am  Duanne Moron, PharmD, [facility] Clinical Pharmacist [facility] [phone]

## 2020-02-29 NOTE — Telephone Encounter (Signed)
Attempted to call the patient's wife back and there was no answer. I left a message letting her know about setting up a lab appt.

## 2020-02-29 NOTE — Telephone Encounter (Signed)
Please notify patient that we would like him to schedule a Thyroid blood panel in the next 1-2 weeks.  Last change to thyroid med done 12/12/19   Disp Refills Start End  levothyroxine (SYNTHROID) 125 MCG tablet 90 tablet 1 12/17/2019   Take 1 tablet (125 mcg total) by mouth daily before breakfast. - Oral  Notes to Pharmacy: Dose increase from 112 mcg up to 125 mcg    He was increased from 112 up to .  We would like to check thyroid lab now, and can advise him on dosage adjustment if needed after the blood work.  He can schedule a fasting lab only visit, orders are in.  He may also schedule a virtual follow-up to review the result if he would like, or wait to see the result.  He did have questions about shortness of breath, if he wants to discuss that he would need an appointment, otherwise he can keep current apt in 04/2020  Saralyn Pilar, DO [facility] 02/29/2020, 12:59 PM

## 2020-02-29 NOTE — Chronic Care Management (AMB) (Signed)
Chronic Care Management Pharmacy Note  02/29/2020 Name:  Luke Mckee MRN:  [id number] DOB:  02/13/1940  Subjective: Luke Mckee is an 80 y.o. year old male who is a primary patient of [name], DO.  The CCM team was consulted for assistance with disease management and care coordination needs.    Engaged with patient and wife by telephone for follow up visit in response to provider referral for pharmacy case management and/or care coordination services.   Consent to Services:  The patient was given information about Chronic Care Management services, agreed to services, and gave verbal consent prior to initiation of services.  Please see initial visit note for detailed documentation.   Objective:  Lab Results  Component Value Date   CREATININE 0.99 10/12/2019   CREATININE 0.81 08/03/2018   CREATININE 0.84 07/25/2018    Lab Results  Component Value Date   HGBA1C 6.1 (H) 10/12/2019       Component Value Date/Time   CHOL 127 10/12/2019 0810   TRIG 123 10/12/2019 0810   HDL 41 10/12/2019 0810   CHOLHDL 3.1 10/12/2019 0810   VLDL 25 07/24/2018 0458   LDLCALC 66 10/12/2019 0810    BP Readings from Last 3 Encounters:  12/18/19 100/62  10/17/19 118/70  07/17/19 132/72    Assessment: Review of patient past medical history, allergies, medications, health status, including review of consultants reports, laboratory and other test data, was performed as part of comprehensive evaluation and provision of chronic care management services.   SDOH:  (Social Determinants of Health) assessments and interventions performed: none   CCM Care Plan  Allergies  Allergen Reactions  . Ace Inhibitors   . Cephalexin     Vomiting and diarrhea  . Crestor [Rosuvastatin Calcium] Other (See Comments)    myalgia  . Tape Rash    blistering    Medications Reviewed Today    Reviewed by [name] on 01/24/20 at [phone]  Med List Status: <None>  Medication Order Taking? Sig  Documenting Provider Last Dose Status Informant  acetaminophen (TYLENOL 8 HOUR) 650 MG CR tablet [id number] No Take 1,300 mg by mouth at bedtime.  [provider] Taking Active Spouse/Significant Other  aspirin 81 MG chewable tablet [id number] No Chew 81 mg by mouth daily. [provider] Taking Active   atenolol (TENORMIN) 50 MG tablet [id number] No Take 1 tablet (50 mg total) by mouth at bedtime. [name], DO Taking Active   atorvastatin (LIPITOR) 80 MG tablet [id number] No Take 1 tablet (80 mg total) by mouth daily. [name], DO Taking Active   cetirizine (ZYRTEC) 10 MG tablet [id number] No Take 10 mg by mouth at bedtime.  [provider] Taking Active Spouse/Significant Other  Cholecalciferol (VITAMIN D) 50 MCG (2000 UT) tablet [id number] No Take 4,000 Units by mouth daily. [provider] Taking Active Spouse/Significant Other  clopidogrel (PLAVIX) 75 MG tablet [id number] No Take 1 tablet (75 mg total) by mouth daily. [name], MD Taking Active Spouse/Significant Other  diclofenac sodium (VOLTAREN) 1 % GEL [id number] No Apply 2 g topically 4 (four) times daily as needed for pain. Foot pain [provider] Taking Active Spouse/Significant Other  docusate sodium (COLACE) 100 MG capsule [id number] No Take 200 mg by mouth daily.  [provider] Taking Active Spouse/Significant Other  furosemide (LASIX) 80 MG tablet [id number] No Take 1 tablet (80 mg total) by mouth 2 (two) times daily. [name], DO Taking Active  ipratropium (ATROVENT) 0.06 % nasal spray [id number] No USE 2 SPRAYS IN EACH NOSTRIL THREE TIMES DAILY AS NEEDED FOR RHINITIS [name], DO Taking Active   isosorbide mononitrate (IMDUR) 30 MG 24 hr tablet [id number]  TAKE 1 TABLET(30 MG) BY MOUTH DAILY [name], Netta [name], DO  Active   levothyroxine (SYNTHROID) 125 MCG tablet [id number] No Take 1 tablet (125 mcg total) by  mouth daily before breakfast. [name], DO Taking Active   loratadine (CLARITIN) 10 MG tablet [id number] No Take 10 mg by mouth daily. [provider] Taking Active   LORazepam (ATIVAN) 0.5 MG tablet [id number] No Take 1 tablet (0.5 mg total) by mouth daily as needed for anxiety or sleep. [name], DO Taking Active Spouse/Significant Other  metroNIDAZOLE (METROCREAM) 0.75 % cream [id number] No Apply topically in the morning and at bedtime. [name], MD Taking Active   Multiple Vitamin (MULTIVITAMIN) tablet [id number] No Take 1 tablet by mouth daily. [provider] Taking Active Spouse/Significant Other  nitroGLYCERIN (NITROSTAT) 0.4 MG SL tablet [id number] No Place 1 tablet (0.4 mg total) under the tongue every 5 (five) minutes as needed for chest pain. [name], DO Taking Active   PATADAY 0.1 % ophthalmic solution [id number] No Place into both eyes as needed.  [provider] Taking Active   senna (SENOKOT) 8.6 MG tablet [id number] No Take 1 tablet by mouth daily. [provider] Taking Active   sertraline (ZOLOFT) 100 MG tablet [id number] No Take 1.5 tablets (150 mg total) by mouth daily. [name], DO Taking Active   spironolactone (ALDACTONE) 25 MG tablet [id number] No TAKE 1/2 TABLET(12.5 MG) BY MOUTH DAILY [name] Netta [name], DO Taking Active           Patient Active Problem List   Diagnosis Date Noted  . Hallux limitus, acquired, right 11/12/2019  . Pre-diabetes 10/17/2019  . Recurrent major depressive disorder, in partial remission (HCC) 04/16/2019  . Morbid obesity (HCC) 04/16/2019  . Coronary artery disease involving coronary bypass graft of native heart without angina pectoris 04/16/2019  . Vasomotor rhinitis 10/25/2018  . Hearing loss 08/28/2018  . Osteoarthritis 08/28/2018  . Tinnitus 08/28/2018  . 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 (HCC)  07/31/2018  . Stroke (HCC) 07/24/2018  . TIA (transient ischemic attack) 07/23/2018  . Slurred speech 07/23/2018  . Right arm weakness 07/23/2018  . Hypokalemia 07/23/2018  . Anxiety 07/20/2018  . Hx of CABG 06/16/2018  . Mixed hyperlipidemia 06/16/2018  . Benign essential HTN 06/16/2018  . History of cerebrovascular accident (CVA) with residual deficit 06/07/2018  . Systolic CHF (HCC) 02/15/2018  . Hypertension 02/15/2018  . S/P total knee arthroplasty, right 02/15/2018  . Primary osteoarthritis of both knees 11/25/2017  . Abnormal nuclear stress test 06/30/2016  . Hallux limitus of right foot 10/22/2015  . Anesthesia complication 10/10/2015  . GERD (gastroesophageal reflux disease) 10/10/2015  . Hypothyroidism 10/10/2015  . OSA on CPAP 01/10/2007  . Diagnosis unknown 01/12/2003    Conditions to be addressed/monitored: HTN, HLD, hypothyroidism and prediabetes  Care Plan : PharmD - Med Management  Updates made by Daphane Shepherd, RPH since 02/29/2020 12:00 AM    Problem: Disease Progression     Long-Range Goal: Disease Progression Prevented or Minimized   Start Date: 02/04/2020  Expected End Date: 05/04/2020  Recent Progress: On track  Priority: High  Note:   Current Barriers:  . Chronic Disease Management support, education, and care coordination needs  related to CAD, HTN, HLD, and hx CVA  Pharmacist Clinical Goal(s):  Marland Kitchen Over the next 90 days, patient will adhere to plan to optimize therapeutic regimen for hypothyroidism as evidenced by report of adherence to recommended medication management changes through collaboration with PharmD and provider.  .   Interventions: . 1:1 collaboration with [name], DO regarding development and update of comprehensive plan of care as evidenced by provider attestation and co-signature . Inter-disciplinary care team collaboration (see longitudinal plan of care)  Hypothyroidism . Current treatment: levothyroxine 125  mcg daily before breakfast Lab Results  Component Value Date   TSH 7.25 (H) 12/12/2019 o  Note patient's levothyroxine dose increased on 12/6 to levothyroxine 125 mcg daily before breakfast . Follow up with patient/wife today regarding levothyroxine administration o Report patient taking levothyroxine consistently in the morning on an empty stomach, at least 30 minutes before food.  o Reports now consistently separating levothyroxine by at least 4 hours from calcium- or iron-containing products using new weekly pill organizer - Note patient's wife previously reported had been administering calcium-containing multivitamin at the same time as levothyroxine . Will follow up with PCP regarding order for next thyroid function lab  Hypertension . Denies checking home BP readings recently . Discuss rational for/encourage caregiver to continue to monitor home BP 1-2 times/week, keep log of results and call providers for readings outside of established parameters.  . Patient states doing a home exercise regimen (received from rehab) 6 days/week.  o Encourage patient to follow up with PCP - States that he continues to have shortness of breath with exertion and would like to discuss this with PCP. States that this is has been a chronic issue, not a new concern - Note next visit with PCP scheduled for 4/6 - Patient states that he will call to office to schedule sooner appointment to discuss  Patient Goals/Self-Care Activities . Over the next 90 days, patient will:  - take medications as prescribed with assistance of wife   Note: patient uses weekly pillbox as filled by wife - check blood pressure, document, and provide at future appointments  Follow Up Plan: Telephone follow up appointment with care management team member scheduled for: 5/18 at 11:15 am     Follow Up:  Patient agrees to Care Plan and Follow-up.  Duanne Moron, PharmD, [facility] Clinical Pharmacist [facility] [phone]

## 2020-02-29 NOTE — Telephone Encounter (Signed)
Attempted to contact the patient, no answer. LMOM to return my call. ?

## 2020-03-13 ENCOUNTER — Telehealth: Payer: Medicare PPO | Admitting: General Practice

## 2020-03-13 ENCOUNTER — Ambulatory Visit (INDEPENDENT_AMBULATORY_CARE_PROVIDER_SITE_OTHER): Payer: Medicare PPO | Admitting: General Practice

## 2020-03-13 ENCOUNTER — Telehealth: Payer: Medicare PPO

## 2020-03-13 DIAGNOSIS — I10 Essential (primary) hypertension: Secondary | ICD-10-CM

## 2020-03-13 DIAGNOSIS — I5022 Chronic systolic (congestive) heart failure: Secondary | ICD-10-CM

## 2020-03-13 DIAGNOSIS — E782 Mixed hyperlipidemia: Secondary | ICD-10-CM

## 2020-03-13 DIAGNOSIS — R296 Repeated falls: Secondary | ICD-10-CM

## 2020-03-13 NOTE — Patient Instructions (Signed)
Visit Information  PATIENT GOALS: Goals Addressed            This Visit's Progress   . RNCM: Prevent Falls and Injury       Follow Up Date 05-22-2020 at 1 pm   - add more outdoor lighting - always use handrails on the stairs - always wear low-heeled or flat shoes or slippers with nonskid soles - call the doctor if I am feeling too drowsy - install bathroom grab bars - keep a flashlight by the bed - keep my cell phone with me always - learn how to get back up if I fall - make an emergency alert plan in case I fall - pick up clutter from the floors - use a nonslip pad with throw rugs, or remove them completely - use a cane or walker - use a nightlight in the bathroom - wear my glasses and/or hearing aid    Why is this important?    Most falls happen when it is hard for you to walk safely. Your balance may be off because of an illness. You may have pain in your knees, hip or other joints.   You may be overly tired or taking medicines that make you sleepy. You may not be able to see or hear clearly.   Falls can lead to broken bones, bruises or other injuries.   There are things you can do to help prevent falling.     Notes: patient had a fall last week when leaving a restaurant        Patient Care Plan: RNCM: Heart Failure (Adult)    Problem Identified: RNCM: Symptom Exacerbation (Heart Failure)   Priority: Medium    Goal: RNCM: Symptom Exacerbation Prevented or Minimized   Priority: Medium  Note:   Current Barriers:  Marland Kitchen Knowledge deficits related to basic heart failure pathophysiology and self care management . Unable to independently manage HF . Lacks social connections . Does not contact provider office for questions/concerns . Lack of scale in home . Financial strain  Materials engineer):   Over the next 120 days, patient will weigh self daily and record  Over the next 120 days, patient will verbalize understanding of Heart Failure Action  Plan and when to call doctor  Over the next 120 days, patient will take all Heart Failure mediations as prescribed Interventions:  . Collaboration with [name], DO regarding development and update of comprehensive plan of care as evidenced by provider attestation and co-signature . Inter-disciplinary care team collaboration (see longitudinal plan of care) . Basic overview and discussion of pathophysiology of Heart Failure . Provided written and verbal education on low sodium diet.  03-13-2020: Verbalized compliance with heart healthy diet. . Reviewed Heart Failure Action Plan in depth and provided written copy . Assessed for scales in home . Discussed importance of daily weight- 03-13-2020: The patient does not weight daily. States he knows when his weight fluctuates . Reviewed role of diuretics in prevention of fluid overload  Patient Goals/Self-Care Activities . Over the next 120 days, patient will:  - Take Heart Failure Medications as prescribed - Weigh daily and record (notify MD with 3 lb weight gain over night or 5 lb in a week) - Follow CHF Action Plan - Adhere to low sodium diet - barriers to lifestyle changes reviewed and addressed - barriers to treatment reviewed and addressed - cognitive screening completed and reviewed - depression screen reviewed - health literacy screening completed or  reviewed - rescue (action) plan reviewed - self-awareness of signs/symptoms of worsening disease encouraged  Follow Up Plan: Telephone follow up appointment with care management team member scheduled for: 05-22-2020 at 1 pm   Task: RNCM: Identify and Minimize Risk of Heart Failure Exacerbation   Note:   Care Management Activities:    - barriers to lifestyle changes reviewed and addressed - barriers to treatment reviewed and addressed - cognitive screening completed and reviewed - depression screen reviewed - health literacy screening completed or reviewed - rescue  (action) plan reviewed - self-awareness of signs/symptoms of worsening disease encouraged       Patient Care Plan: RNCM: Management of HTN    Problem Identified: RNCM: Hypertension (Hypertension)   Priority: Medium    Goal: RNCM: Hypertension Monitored   Priority: Medium  Note:   Objective:  . Last practice recorded BP readings:  BP Readings from Last 3 Encounters:  12/18/19 100/62  10/17/19 118/70  07/17/19 132/72 .   Marland Kitchen Most recent eGFR/CrCl: No results found for: EGFR  No components found for: CRCL Current Barriers:  Marland Kitchen Knowledge Deficits related to basic understanding of hypertension pathophysiology and self care management . Knowledge Deficits related to understanding of medications prescribed for management of hypertension . Limited Social Support . Unable to independently manage HTN . Does not contact provider office for questions/concerns Case Manager Clinical Goal(s):  Marland Kitchen Over the next 120 days, patient will verbalize understanding of plan for hypertension management . Over the next 120 days, patient will attend all scheduled medical appointments: 04-16-2020 at 11 am . Over the next 120 days, patient will demonstrate improved adherence to prescribed treatment plan for hypertension as evidenced by taking all medications as prescribed, monitoring and recording blood pressure as directed, adhering to low sodium/DASH diet . Over the next 120 days, patient will demonstrate improved health management independence as evidenced by checking blood pressure as directed and notifying PCP if SBP>160 or DBP > 90, taking all medications as prescribe, and adhering to a low sodium diet as discussed. . Over the next 120 days, patient will verbalize basic understanding of hypertension disease process and self health management plan as evidenced by compliance with medications, heart healthy diet, and working with the CCM team to optimize health and well being  Interventions:  . Collaboration with  Olin Hauser, DO regarding development and update of comprehensive plan of care as evidenced by provider attestation and co-signature . Inter-disciplinary care team collaboration (see longitudinal plan of care) . Evaluation of current treatment plan related to hypertension self management and patient's adherence to plan as established by provider. 03-13-2020: Denies any episodes of low blood pressures. The patient says he is doing well.  . Provided education to patient re: stroke prevention, s/s of heart attack and stroke, DASH diet, complications of uncontrolled blood pressure . Reviewed medications with patient and discussed importance of compliance . Discussed plans with patient for ongoing care management follow up and provided patient with direct contact information for care management team . Advised patient, providing education and rationale, to monitor blood pressure daily and record, calling PCP for findings outside established parameters.  . Reviewed scheduled/upcoming provider appointments including: 04-16-2020 at 11 am Patient Goals/Self-Care Activities . Over the next 120 days, patient will:  - UNABLE to independently manage HTN Self administers medications as prescribed Attends all scheduled provider appointments Calls provider office for new concerns, questions, or BP outside discussed parameters Checks BP and records as discussed Follows a low sodium diet/DASH  diet - blood pressure equipment and technique reviewed - blood pressure trends reviewed - depression screen reviewed - home or ambulatory blood pressure monitoring encouraged  Follow Up Plan: Telephone follow up appointment with care management team member scheduled for: 05-22-2020 at 1 pm   Task: RNCM: Identify and Monitor Blood Pressure Elevation   Note:   Care Management Activities:    - blood pressure equipment and technique reviewed - blood pressure trends reviewed - depression screen reviewed - home or  ambulatory blood pressure monitoring encouraged       Patient Care Plan: RNCM: Management of HLD    Problem Identified: RNCM: HLD Health Promotion or Disease Self-Management (General Plan of Care)   Priority: Medium    Goal: RNCM;HLD Self-Management Plan Developed   Priority: Medium  Note:   Current Barriers:  . Poorly controlled hyperlipidemia, complicated by OSA,HTN, CAD, HLD, CVA . Current antihyperlipidemic regimen: Atorvastatin 80 mg QD . Most recent lipid panel:     Component Value Date/Time   CHOL 127 10/12/2019 0810   TRIG 123 10/12/2019 0810   HDL 41 10/12/2019 0810   CHOLHDL 3.1 10/12/2019 0810   VLDL 25 07/24/2018 0458   LDLCALC 66 10/12/2019 0810 .   Marland Kitchen ASCVD risk enhancing conditions: age >74, HTN, CHF . Unable to independently manage HLD . Lacks social connections . Does not contact provider office for questions/concerns  RN Care Manager Clinical Goal(s):  Marland Kitchen Over the next 120 days, patient will work with Consulting civil engineer, providers, and care team towards execution of optimized self-health management plan . Over the next 120 days, patient will verbalize understanding of plan for HLD . Over the next 120 days, patient will work with [facility] and pcp to address needs related to HLD . Over the next 120 days, patient will attend all scheduled medical appointments: 04-16-2020 at 11 am  Interventions: . Collaboration with Olin Hauser, DO regarding development and update of comprehensive plan of care as evidenced by provider attestation and co-signature . Inter-disciplinary care team collaboration (see longitudinal plan of care) . Medication review performed; medication list updated in electronic medical record.  Bertram Savin care team collaboration (see longitudinal plan of care) . Referred to pharmacy team for assistance with HLD medication management . Evaluation of current treatment plan related to HLD and patient's adherence to plan as established by  provider. 03-13-2020: The patient is doing well and denies any new concerns with management of HLD . Advised patient to call the office for changes in condition or questions about chronic conditions  . Provided education to patient re: Heart Healthy Diet and managing weight.  03-13-2020: The patient denies any issues related to dietary restrictions or changes in weight. Says his appetite is good.  . Reviewed medications with patient and discussed compliance. 03-13-2020: Endorses compliance with current medications regimen  . Discussed plans with patient for ongoing care management follow up and provided patient with direct contact information for care management team . Reviewed scheduled/upcoming provider appointments including: 04-16-2020 11 am   Patient Goals/Self-Care Activities: . Over the next 120 days, patient will:   - call for medicine refill 2 or 3 days before it runs out - call if I am sick and can't take my medicine - keep a list of all the medicines I take; vitamins and herbals too - learn to read medicine labels - use a pillbox to sort medicine - use an alarm clock or phone to remind me to take my medicine - change  to whole grain breads, cereal, pasta - drink 6 to 8 glasses of water each day - eat 5 or 6 small meals each day - limit fast food meals to no more than 1 per week - prepare main meal at home 3 to 5 days each week - read food labels for fat, fiber, carbohydrates and portion size - be open to making changes - I can manage, know and watch for signs of a heart attack - if I have chest pain, call for help - learn about small changes that will make a big difference - learn my personal risk factors - barriers to meeting goals identified - choices provided - collaboration with team encouraged - decision-making supported - difficulty of making life-long changes acknowledged - health risks reviewed - problem-solving facilitated - questions answered - readiness for change  evaluated - reassurance provided - resources needed to meet goals identified - self-reflection promoted - self-reliance encouraged   Follow Up Plan: Telephone follow up appointment with care management team member scheduled for: 05-22-2020 at 1 pm     Task: RNCM: HLD Mutually Develop and Royce Macadamia Achievement of Patient Goals   Note:   Care Management Activities:    - barriers to meeting goals identified - choices provided - collaboration with team encouraged - decision-making supported - difficulty of making life-long changes acknowledged - health risks reviewed - problem-solving facilitated - questions answered - readiness for change evaluated - reassurance provided - resources needed to meet goals identified - self-reflection promoted - self-reliance encouraged       Patient Care Plan: PharmD - Med Management    Problem Identified: Disease Progression     Long-Range Goal: Disease Progression Prevented or Minimized   Start Date: 02/04/2020  Expected End Date: 05/04/2020  Recent Progress: On track  Priority: High  Note:   Current Barriers:  . Chronic Disease Management support, education, and care coordination needs related to CAD, HTN, HLD, and hx CVA  Pharmacist Clinical Goal(s):  Marland Kitchen Over the next 90 days, patient will adhere to plan to optimize therapeutic regimen for hypothyroidism as evidenced by report of adherence to recommended medication management changes through collaboration with PharmD and provider.  .   Interventions: . 1:1 collaboration with Olin Hauser, DO regarding development and update of comprehensive plan of care as evidenced by provider attestation and co-signature . Inter-disciplinary care team collaboration (see longitudinal plan of care)  Hypothyroidism . Current treatment: levothyroxine 125 mcg daily before breakfast Lab Results  Component Value Date   TSH 7.25 (H) 12/12/2019 o  Note patient's levothyroxine dose increased on  12/6 to levothyroxine 125 mcg daily before breakfast . Follow up with patient/wife today regarding levothyroxine administration o Report patient taking levothyroxine consistently in the morning on an empty stomach, at least 30 minutes before food.  o Reports now consistently separating levothyroxine by at least 4 hours from calcium- or iron-containing products using new weekly pill organizer - Note patient's wife previously reported had been administering calcium-containing multivitamin at the same time as levothyroxine . Will follow up with PCP regarding order for next thyroid function lab  Hypertension . Denies checking home BP readings recently . Discuss rational for/encourage caregiver to continue to monitor home BP 1-2 times/week, keep log of results and call providers for readings outside of established parameters.  . Patient states doing a home exercise regimen (received from rehab) 6 days/week.  o Encourage patient to follow up with PCP - States that he continues to have shortness  of breath with exertion and would like to discuss this with PCP. States that this is has been a chronic issue, not a new concern - Note next visit with PCP scheduled for 4/6 - Patient states that he will call to office to schedule sooner appointment to discuss  Patient Goals/Self-Care Activities . Over the next 90 days, patient will:  - take medications as prescribed with assistance of wife   Note: patient uses weekly pillbox as filled by wife - check blood pressure, document, and provide at future appointments  Follow Up Plan: Telephone follow up appointment with care management team member scheduled for: 5/18 at 11:15 am    Patient Care Plan: RNCM: Fall Risk (Adult)    Problem Identified: RNCM: Fall Risk   Priority: High    Long-Range Goal: RNCM: Absence of Fall and Fall-Related Injury   Priority: High  Note:   Current Barriers:  Marland Kitchen Knowledge Deficits related to fall precautions in patient with  multiple chronic conditions . Decreased adherence to prescribed treatment for fall prevention . Lacks social connections . Does not contact provider office for questions/concerns . Does not call office and report new falls when they occur . Knowledge Deficits related to how to obtain handicap sticker to use with his car . Chronic Disease Management support and education needs related to falls prevention and safety in patient with frequent falls due to unsteady gain and impaired mobility . Lacks caregiver support.  Clinical Goal(s):  . patient will demonstrate improved adherence to prescribed treatment plan for decreasing falls as evidenced by patient reporting and review of EMR . patient will verbalize using fall risk reduction strategies discussed . patient will not experience additional falls . patient will verbalize understanding of plan for fall prevention and safety in the home . patient will work with pcp, [facility] staff to address needs related to getting paperwork for handicap sticker for the patients care  . patient will attend all scheduled medical appointments: 04-26-2020 at 11 am Interventions:  . Collaboration with [name], DO regarding development and update of comprehensive plan of care as evidenced by provider attestation and co-signature . Inter-disciplinary care team collaboration (see longitudinal plan of care) . Provided written and verbal education re: Potential causes of falls and Fall prevention strategies . Reviewed medications and discussed potential side effects of medications such as dizziness and frequent urination . Assessed for s/s of orthostatic hypotension. 03-13-2020: The patient denies any issues with blood pressure drops . Assessed for falls since last encounter. 03-13-2020: The patient had a fall last week. The patient fell when he stepped off of a curb when leaving a restaurant.  Is interested in a handicap sticker for his car, will discuss with the  pcp. See separate care plan for falls prevention and safety.  . Assessed patients knowledge of fall risk prevention secondary to previously provided education. 03-13-2020: Reviewed today wit the patient  . Assessed working status of life alert bracelet and patient adherence . Provided patient information for fall alert systems . Evaluation of current treatment plan related to falls prevention and safety  and patient's adherence to plan as established by provider. . Advised patient to call the office for new falls, seek emergent help if needed for falls with injury and work with [facility] for fall prevention and safety  . Provided education to patient re: being safe and monitoring for potential high fall risk areas, using DME to help with falls, reporting falls.  Marland Kitchen Collaborated with pcp and [facility]  clinical staff regarding the patient request for a handicap sticker for his car so he will not have to walk long distances  . Reviewed scheduled/upcoming provider appointments including:  . Discussed plans with patient for ongoing care management follow up and provided patient with direct contact information for care management team Self-Care Deficits:  Unable to independently manage falls as evidence of fall last week when leaving a restaurant  Lacks social connections Does not contact provider office for questions/concerns Patient Goals:  - Utilize cane (assistive device) appropriately with all ambulation- states he has misplaced his cane but is actively looking for it.  - De-clutter walkways - Change positions slowly - Wear secure fitting shoes at all times with ambulation - Utilize home lighting for dim lit areas - Demonstrate self and pet awareness at all times - activities of daily living skills assessed - assistive or adaptive device use encouraged - barriers to physical activity or exercise addressed - barriers to physical activity or exercise identified - barriers to safety identified - cognition  assessed - cognitive-stimulating activities promoted - fall prevention plan reviewed and updated - fear of falling, loss of independence and pain acknowledged - medication list reviewed - modification of home and work environment promoted Follow Up Plan: Telephone follow up appointment with care management team member scheduled for: 05-22-2020 at 1 pm   Task: RNCM: Identify and Manage Contributors to Fall Risk   Note:   Care Management Activities:    - activities of daily living skills assessed - assistive or adaptive device use encouraged - barriers to physical activity or exercise addressed - barriers to physical activity or exercise identified - barriers to safety identified - cognition assessed - cognitive-stimulating activities promoted - fall prevention plan reviewed and updated - fear of falling, loss of independence and pain acknowledged - medication list reviewed - modification of home and work environment promoted        Patient verbalizes understanding of instructions provided today and agrees to view in [place].   Telephone follow up appointment with care management team member scheduled for: 05-22-2020 at 1 pm  Noreene Larsson RN, MSN, [place] Mobile: [phone]

## 2020-03-13 NOTE — Chronic Care Management (AMB) (Signed)
Chronic Care Management   CCM RN Visit Note  03/13/2020 Name: Luke Mckee MRN: [id number] DOB: 01/22/1940  Subjective: Luke Mckee is a 80 y.o. year old male who is a primary care patient of Olin Hauser, DO. The care management team was consulted for assistance with disease management and care coordination needs.    Engaged with patient by telephone for follow up visit in response to provider referral for case management and/or care coordination services.   Consent to Services:  The patient was given information about Chronic Care Management services, agreed to services, and gave verbal consent prior to initiation of services.  Please see initial visit note for detailed documentation.   Patient agreed to services and verbal consent obtained.   Assessment: Review of patient past medical history, allergies, medications, health status, including review of consultants reports, laboratory and other test data, was performed as part of comprehensive evaluation and provision of chronic care management services.   SDOH (Social Determinants of Health) assessments and interventions performed:    CCM Care Plan  Allergies  Allergen Reactions   Ace Inhibitors    Cephalexin     Vomiting and diarrhea   Crestor [Rosuvastatin Calcium] Other (See Comments)    myalgia   Tape Rash    blistering    Outpatient Encounter Medications as of 03/13/2020  Medication Sig   acetaminophen (TYLENOL 8 HOUR) 650 MG CR tablet Take 1,300 mg by mouth at bedtime.    aspirin 81 MG chewable tablet Chew 81 mg by mouth daily.   atenolol (TENORMIN) 50 MG tablet Take 1 tablet (50 mg total) by mouth at bedtime.   atorvastatin (LIPITOR) 80 MG tablet Take 1 tablet (80 mg total) by mouth daily.   cetirizine (ZYRTEC) 10 MG tablet Take 10 mg by mouth at bedtime.    Cholecalciferol (VITAMIN D) 50 MCG (2000 UT) tablet Take 4,000 Units by mouth daily.   clopidogrel (PLAVIX) 75 MG tablet Take 1 tablet (75 mg  total) by mouth daily.   diclofenac sodium (VOLTAREN) 1 % GEL Apply 2 g topically 4 (four) times daily as needed for pain. Foot pain   docusate sodium (COLACE) 100 MG capsule Take 200 mg by mouth daily.    furosemide (LASIX) 80 MG tablet Take 1 tablet (80 mg total) by mouth 2 (two) times daily.   ipratropium (ATROVENT) 0.06 % nasal spray USE 2 SPRAYS IN EACH NOSTRIL THREE TIMES DAILY AS NEEDED FOR RHINITIS   isosorbide mononitrate (IMDUR) 30 MG 24 hr tablet TAKE 1 TABLET(30 MG) BY MOUTH DAILY   levothyroxine (SYNTHROID) 125 MCG tablet Take 1 tablet (125 mcg total) by mouth daily before breakfast.   loratadine (CLARITIN) 10 MG tablet Take 10 mg by mouth daily.   LORazepam (ATIVAN) 0.5 MG tablet Take 1 tablet (0.5 mg total) by mouth daily as needed for anxiety or sleep.   metroNIDAZOLE (METROCREAM) 0.75 % cream Apply topically in the morning and at bedtime.   Multiple Vitamin (MULTIVITAMIN) tablet Take 1 tablet by mouth daily.   nitroGLYCERIN (NITROSTAT) 0.4 MG SL tablet Place 1 tablet (0.4 mg total) under the tongue every 5 (five) minutes as needed for chest pain.   PATADAY 0.1 % ophthalmic solution Place into both eyes as needed.    senna (SENOKOT) 8.6 MG tablet Take 1 tablet by mouth daily.   sertraline (ZOLOFT) 100 MG tablet Take 1.5 tablets (150 mg total) by mouth daily.   spironolactone (ALDACTONE) 25 MG tablet TAKE 1/2 TABLET(12.5 MG) BY  MOUTH DAILY   No facility-administered encounter medications on file as of 03/13/2020.    Patient Active Problem List   Diagnosis Date Noted   Hallux limitus, acquired, right 11/12/2019   Pre-diabetes 10/17/2019   Recurrent major depressive disorder, in partial remission ([place]) 04/16/2019   Morbid obesity ([place]) 04/16/2019   Coronary artery disease involving coronary bypass graft of native heart without angina pectoris 04/16/2019   Vasomotor rhinitis 10/25/2018   Hearing loss 08/28/2018   Osteoarthritis 08/28/2018   Tinnitus  08/28/2018   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 ([place]) 07/31/2018   Stroke ([place]) 07/24/2018   TIA (transient ischemic attack) 07/23/2018   Slurred speech 07/23/2018   Right arm weakness 07/23/2018   Hypokalemia 07/23/2018   Anxiety 07/20/2018   Hx of CABG 06/16/2018   Mixed hyperlipidemia 06/16/2018   Benign essential HTN 06/16/2018   History of cerebrovascular accident (CVA) with residual deficit 12/87/8676   Systolic CHF ([place]) 72/09/4707   Hypertension 02/15/2018   S/P total knee arthroplasty, right 02/15/2018   Primary osteoarthritis of both knees 11/25/2017   Abnormal nuclear stress test 06/30/2016   Hallux limitus of right foot 10/22/2015   Anesthesia complication 62/83/6629   GERD (gastroesophageal reflux disease) 10/10/2015   Hypothyroidism 10/10/2015   OSA on CPAP 01/10/2007   Diagnosis unknown 01/12/2003    Conditions to be addressed/monitored:CHF, HTN, HLD and Falls prevention due to recent fall  Care Plan : RNCM: Heart Failure (Adult)  Updates made by Vanita Ingles since 03/13/2020 12:00 AM    Problem: RNCM: Symptom Exacerbation (Heart Failure)   Priority: Medium    Goal: RNCM: Symptom Exacerbation Prevented or Minimized   Priority: Medium  Note:   Current Barriers:   Knowledge deficits related to basic heart failure pathophysiology and self care management  Unable to independently manage HF  Lacks social connections  Does not contact provider office for questions/concerns  Lack of scale in home  Financial strain  Nurse Case Manager Clinical Goal(s):   Over the next 120 days, patient will weigh self daily and record  Over the next 120 days, patient will verbalize understanding of Heart Failure Action Plan and when to call doctor  Over the next 120 days, patient will take all Heart Failure mediations as prescribed Interventions:   Collaboration with Olin Hauser, DO regarding development  and update of comprehensive plan of care as evidenced by provider attestation and co-signature  Inter-disciplinary care team collaboration (see longitudinal plan of care)  Basic overview and discussion of pathophysiology of Heart Failure  Provided written and verbal education on low sodium diet.  03-13-2020: Verbalized compliance with heart healthy diet.  Reviewed Heart Failure Action Plan in depth and provided written copy  Assessed for scales in home  Discussed importance of daily weight- 03-13-2020: The patient does not weight daily. States he knows when his weight fluctuates  Reviewed role of diuretics in prevention of fluid overload  Patient Goals/Self-Care Activities  Over the next 120 days, patient will:  - Take Heart Failure Medications as prescribed - Weigh daily and record (notify MD with 3 lb weight gain over night or 5 lb in a week) - Follow CHF Action Plan - Adhere to low sodium diet - barriers to lifestyle changes reviewed and addressed - barriers to treatment reviewed and addressed - cognitive screening completed and reviewed - depression screen reviewed - health literacy screening completed or reviewed - rescue (action) plan reviewed - self-awareness of signs/symptoms of worsening disease encouraged  Follow Up Plan: Telephone follow up appointment with care management team member scheduled for: 05-22-2020 at 1 pm   Care Plan : RNCM: Management of HTN  Updates made by Vanita Ingles since 03/13/2020 12:00 AM    Problem: RNCM: Hypertension (Hypertension)   Priority: Medium    Goal: RNCM: Hypertension Monitored   Priority: Medium  Note:   Objective:   Last practice recorded BP readings:  BP Readings from Last 3 Encounters:  12/18/19 100/62  10/17/19 118/70  07/17/19 132/72     Most recent eGFR/CrCl: No results found for: EGFR  No components found for: CRCL Current Barriers:   Knowledge Deficits related to basic understanding of hypertension pathophysiology  and self care management  Knowledge Deficits related to understanding of medications prescribed for management of hypertension  Limited Social Support  Unable to independently manage HTN  Does not contact provider office for questions/concerns Case Manager Clinical Goal(s):   Over the next 120 days, patient will verbalize understanding of plan for hypertension management  Over the next 120 days, patient will attend all scheduled medical appointments: 04-16-2020 at 11 am  Over the next 120 days, patient will demonstrate improved adherence to prescribed treatment plan for hypertension as evidenced by taking all medications as prescribed, monitoring and recording blood pressure as directed, adhering to low sodium/DASH diet  Over the next 120 days, patient will demonstrate improved health management independence as evidenced by checking blood pressure as directed and notifying PCP if SBP>160 or DBP > 90, taking all medications as prescribe, and adhering to a low sodium diet as discussed.  Over the next 120 days, patient will verbalize basic understanding of hypertension disease process and self health management plan as evidenced by compliance with medications, heart healthy diet, and working with the CCM team to optimize health and well being  Interventions:   Collaboration with [place], Devonne Doughty, DO regarding development and update of comprehensive plan of care as evidenced by provider attestation and co-signature  Inter-disciplinary care team collaboration (see longitudinal plan of care)  Evaluation of current treatment plan related to hypertension self management and patient's adherence to plan as established by provider. 03-13-2020: Denies any episodes of low blood pressures. The patient says he is doing well.   Provided education to patient re: stroke prevention, s/s of heart attack and stroke, DASH diet, complications of uncontrolled blood pressure  Reviewed medications with  patient and discussed importance of compliance  Discussed plans with patient for ongoing care management follow up and provided patient with direct contact information for care management team  Advised patient, providing education and rationale, to monitor blood pressure daily and record, calling PCP for findings outside established parameters.   Reviewed scheduled/upcoming provider appointments including: 04-16-2020 at 11 am Patient Goals/Self-Care Activities  Over the next 120 days, patient will:  - UNABLE to independently manage HTN Self administers medications as prescribed Attends all scheduled provider appointments Calls provider office for new concerns, questions, or BP outside discussed parameters Checks BP and records as discussed Follows a low sodium diet/DASH diet - blood pressure equipment and technique reviewed - blood pressure trends reviewed - depression screen reviewed - home or ambulatory blood pressure monitoring encouraged  Follow Up Plan: Telephone follow up appointment with care management team member scheduled for: 05-22-2020 at 1 pm   Care Plan : RNCM: Management of HLD  Updates made by Vanita Ingles since 03/13/2020 12:00 AM    Problem: RNCM: [place] or Disease Self-Management (General Plan  of Care)   Priority: Medium    Goal: RNCM;HLD Self-Management Plan Developed   Priority: Medium  Note:   Current Barriers:   Poorly controlled hyperlipidemia, complicated by OSA,HTN, CAD, HLD, CVA  Current antihyperlipidemic regimen: Atorvastatin 80 mg QD  Most recent lipid panel:     Component Value Date/Time   CHOL 127 10/12/2019 0810   TRIG 123 10/12/2019 0810   HDL 41 10/12/2019 0810   CHOLHDL 3.1 10/12/2019 0810   VLDL 25 07/24/2018 0458   LDLCALC 66 10/12/2019 0810     ASCVD risk enhancing conditions: age >36, HTN, CHF  Unable to independently manage HLD  Lacks social connections  Does not contact provider office for  questions/concerns  RN Care Manager Clinical Goal(s):   Over the next 120 days, patient will work with Consulting civil engineer, providers, and care team towards execution of optimized self-health management plan  Over the next 120 days, patient will verbalize understanding of plan for HLD  Over the next 120 days, patient will work with [facility] and pcp to address needs related to HLD  Over the next 120 days, patient will attend all scheduled medical appointments: 04-16-2020 at 11 am  Interventions:  Collaboration with Olin Hauser, DO regarding development and update of comprehensive plan of care as evidenced by provider attestation and co-signature  Inter-disciplinary care team collaboration (see longitudinal plan of care)  Medication review performed; medication list updated in electronic medical record.   Inter-disciplinary care team collaboration (see longitudinal plan of care)  Referred to pharmacy team for assistance with HLD medication management  Evaluation of current treatment plan related to HLD and patient's adherence to plan as established by provider. 03-13-2020: The patient is doing well and denies any new concerns with management of HLD  Advised patient to call the office for changes in condition or questions about chronic conditions   Provided education to patient re: Heart Healthy Diet and managing weight.  03-13-2020: The patient denies any issues related to dietary restrictions or changes in weight. Says his appetite is good.   Reviewed medications with patient and discussed compliance. 03-13-2020: Endorses compliance with current medications regimen   Discussed plans with patient for ongoing care management follow up and provided patient with direct contact information for care management team  Reviewed scheduled/upcoming provider appointments including: 04-16-2020 11 am   Patient Goals/Self-Care Activities:  Over the next 120 days, patient will:   - call for  medicine refill 2 or 3 days before it runs out - call if I am sick and can't take my medicine - keep a list of all the medicines I take; vitamins and herbals too - learn to read medicine labels - use a pillbox to sort medicine - use an alarm clock or phone to remind me to take my medicine - change to whole grain breads, cereal, pasta - drink 6 to 8 glasses of water each day - eat 5 or 6 small meals each day - limit fast food meals to no more than 1 per week - prepare main meal at home 3 to 5 days each week - read food labels for fat, fiber, carbohydrates and portion size - be open to making changes - I can manage, know and watch for signs of a heart attack - if I have chest pain, call for help - learn about small changes that will make a big difference - learn my personal risk factors - barriers to meeting goals identified - choices provided - collaboration  with team encouraged - decision-making supported - difficulty of making life-long changes acknowledged - health risks reviewed - problem-solving facilitated - questions answered - readiness for change evaluated - reassurance provided - resources needed to meet goals identified - self-reflection promoted - self-reliance encouraged   Follow Up Plan: Telephone follow up appointment with care management team member scheduled for: 05-22-2020 at 1 pm     Care Plan : RNCM: Fall Risk (Adult)  Updates made by Vanita Ingles since 03/13/2020 12:00 AM    Problem: RNCM: Fall Risk   Priority: High    Long-Range Goal: RNCM: Absence of Fall and Fall-Related Injury   Priority: High  Note:   Current Barriers:   Knowledge Deficits related to fall precautions in patient with multiple chronic conditions  Decreased adherence to prescribed treatment for fall prevention  Lacks social connections  Does not contact provider office for questions/concerns  Does not call office and report new falls when they occur  Knowledge Deficits  related to how to obtain handicap sticker to use with his car  Chronic Disease Management support and education needs related to falls prevention and safety in patient with frequent falls due to unsteady gain and impaired mobility  Lacks caregiver support.  Clinical Goal(s):   patient will demonstrate improved adherence to prescribed treatment plan for decreasing falls as evidenced by patient reporting and review of EMR  patient will verbalize using fall risk reduction strategies discussed  patient will not experience additional falls  patient will verbalize understanding of plan for fall prevention and safety in the home  patient will work with pcp, [facility] staff to address needs related to getting paperwork for handicap sticker for the patients care   patient will attend all scheduled medical appointments: 04-26-2020 at 11 am Interventions:   Collaboration with Olin Hauser, DO regarding development and update of comprehensive plan of care as evidenced by provider attestation and co-signature  Inter-disciplinary care team collaboration (see longitudinal plan of care)  Provided written and verbal education re: Potential causes of falls and Fall prevention strategies  Reviewed medications and discussed potential side effects of medications such as dizziness and frequent urination  Assessed for s/s of orthostatic hypotension. 03-13-2020: The patient denies any issues with blood pressure drops  Assessed for falls since last encounter. 03-13-2020: The patient had a fall last week. The patient fell when he stepped off of a curb when leaving a restaurant.  Is interested in a handicap sticker for his car, will discuss with the pcp. See separate care plan for falls prevention and safety.   Assessed patients knowledge of fall risk prevention secondary to previously provided education. 03-13-2020: Reviewed today wit the patient   Assessed working status of life alert bracelet and patient  adherence  Provided patient information for fall alert systems  Evaluation of current treatment plan related to falls prevention and safety  and patient's adherence to plan as established by provider.  Advised patient to call the office for new falls, seek emergent help if needed for falls with injury and work with [facility] for fall prevention and safety   Provided education to patient re: being safe and monitoring for potential high fall risk areas, using DME to help with falls, reporting falls.   Collaborated with pcp and [facility] clinical staff regarding the patient request for a handicap sticker for his car so he will not have to walk long distances   Reviewed scheduled/upcoming provider appointments including:   Discussed plans with patient for  ongoing care management follow up and provided patient with direct contact information for care management team Self-Care Deficits:  Unable to independently manage falls as evidence of fall last week when leaving a restaurant  Lacks social connections Does not contact provider office for questions/concerns Patient Goals:  - Utilize cane (assistive device) appropriately with all ambulation- states he has misplaced his cane but is actively looking for it.  - De-clutter walkways - Change positions slowly - Wear secure fitting shoes at all times with ambulation - Utilize home lighting for dim lit areas - Demonstrate self and pet awareness at all times - activities of daily living skills assessed - assistive or adaptive device use encouraged - barriers to physical activity or exercise addressed - barriers to physical activity or exercise identified - barriers to safety identified - cognition assessed - cognitive-stimulating activities promoted - fall prevention plan reviewed and updated - fear of falling, loss of independence and pain acknowledged - medication list reviewed - modification of home and work environment promoted Follow Up Plan:  Telephone follow up appointment with care management team member scheduled for: 05-22-2020 at 1 pm   Task: RNCM: Identify and Manage Contributors to Fall Risk   Note:   Care Management Activities:    - activities of daily living skills assessed - assistive or adaptive device use encouraged - barriers to physical activity or exercise addressed - barriers to physical activity or exercise identified - barriers to safety identified - cognition assessed - cognitive-stimulating activities promoted - fall prevention plan reviewed and updated - fear of falling, loss of independence and pain acknowledged - medication list reviewed - modification of home and work environment promoted         Plan:Telephone follow up appointment with care management team member scheduled for:  05-22-2020 at 1 pm  [place], MSN, [place] [employer]: [phone]

## 2020-03-14 ENCOUNTER — Ambulatory Visit: Payer: Self-pay | Admitting: General Practice

## 2020-03-14 DIAGNOSIS — R296 Repeated falls: Secondary | ICD-10-CM

## 2020-03-14 DIAGNOSIS — E039 Hypothyroidism, unspecified: Secondary | ICD-10-CM

## 2020-03-14 DIAGNOSIS — I5022 Chronic systolic (congestive) heart failure: Secondary | ICD-10-CM | POA: Diagnosis not present

## 2020-03-14 DIAGNOSIS — I10 Essential (primary) hypertension: Secondary | ICD-10-CM | POA: Diagnosis not present

## 2020-03-14 DIAGNOSIS — E782 Mixed hyperlipidemia: Secondary | ICD-10-CM

## 2020-03-14 NOTE — Patient Instructions (Signed)
Visit Information  PATIENT GOALS: Patient Care Plan: RNCM: Heart Failure (Adult)    Problem Identified: RNCM: Symptom Exacerbation (Heart Failure)   Priority: Medium    Goal: RNCM: Symptom Exacerbation Prevented or Minimized   Priority: Medium  Note:   Current Barriers:   Knowledge deficits related to basic heart failure pathophysiology and self care management  Unable to independently manage HF  Lacks social connections  Does not contact provider office for questions/concerns  Lack of scale in home  Financial strain  Nurse Case Manager Clinical Goal(s):   Over the next 120 days, patient will weigh self daily and record  Over the next 120 days, patient will verbalize understanding of Heart Failure Action Plan and when to call doctor  Over the next 120 days, patient will take all Heart Failure mediations as prescribed Interventions:   Collaboration with Olin Hauser, DO regarding development and update of comprehensive plan of care as evidenced by provider attestation and co-signature  Inter-disciplinary care team collaboration (see longitudinal plan of care)  Basic overview and discussion of pathophysiology of Heart Failure  Provided written and verbal education on low sodium diet.  03-13-2020: Verbalized compliance with heart healthy diet.  Reviewed Heart Failure Action Plan in depth and provided written copy  Assessed for scales in home  Discussed importance of daily weight- 03-13-2020: The patient does not weight daily. States he knows when his weight fluctuates  Reviewed role of diuretics in prevention of fluid overload  Patient Goals/Self-Care Activities  Over the next 120 days, patient will:  - Take Heart Failure Medications as prescribed - Weigh daily and record (notify MD with 3 lb weight gain over night or 5 lb in a week) - Follow CHF Action Plan - Adhere to low sodium diet - barriers to lifestyle changes reviewed and addressed - barriers to  treatment reviewed and addressed - cognitive screening completed and reviewed - depression screen reviewed - health literacy screening completed or reviewed - rescue (action) plan reviewed - self-awareness of signs/symptoms of worsening disease encouraged  Follow Up Plan: Telephone follow up appointment with care management team member scheduled for: 05-22-2020 at 1 pm   Task: RNCM: Identify and Minimize Risk of Heart Failure Exacerbation   Note:   Care Management Activities:    - barriers to lifestyle changes reviewed and addressed - barriers to treatment reviewed and addressed - cognitive screening completed and reviewed - depression screen reviewed - health literacy screening completed or reviewed - rescue (action) plan reviewed - self-awareness of signs/symptoms of worsening disease encouraged       Patient Care Plan: RNCM: Management of HTN    Problem Identified: RNCM: Hypertension (Hypertension)   Priority: Medium    Goal: RNCM: Hypertension Monitored   Priority: Medium  Note:   Objective:   Last practice recorded BP readings:  BP Readings from Last 3 Encounters:  12/18/19 100/62  10/17/19 118/70  07/17/19 132/72     Most recent eGFR/CrCl: No results found for: EGFR  No components found for: CRCL Current Barriers:   Knowledge Deficits related to basic understanding of hypertension pathophysiology and self care management  Knowledge Deficits related to understanding of medications prescribed for management of hypertension  Limited Social Support  Unable to independently manage HTN  Does not contact provider office for questions/concerns Case Manager Clinical Goal(s):   Over the next 120 days, patient will verbalize understanding of plan for hypertension management  Over the next 120 days, patient will attend all scheduled medical appointments:  04-16-2020 at 11 am  Over the next 120 days, patient will demonstrate improved adherence to prescribed treatment  plan for hypertension as evidenced by taking all medications as prescribed, monitoring and recording blood pressure as directed, adhering to low sodium/DASH diet  Over the next 120 days, patient will demonstrate improved health management independence as evidenced by checking blood pressure as directed and notifying PCP if SBP>160 or DBP > 90, taking all medications as prescribe, and adhering to a low sodium diet as discussed.  Over the next 120 days, patient will verbalize basic understanding of hypertension disease process and self health management plan as evidenced by compliance with medications, heart healthy diet, and working with the CCM team to optimize health and well being  Interventions:   Collaboration with Parks Ranger, Devonne Doughty, DO regarding development and update of comprehensive plan of care as evidenced by provider attestation and co-signature  Inter-disciplinary care team collaboration (see longitudinal plan of care)  Evaluation of current treatment plan related to hypertension self management and patient's adherence to plan as established by provider. 03-13-2020: Denies any episodes of low blood pressures. The patient says he is doing well.   Provided education to patient re: stroke prevention, s/s of heart attack and stroke, DASH diet, complications of uncontrolled blood pressure  Reviewed medications with patient and discussed importance of compliance  Discussed plans with patient for ongoing care management follow up and provided patient with direct contact information for care management team  Advised patient, providing education and rationale, to monitor blood pressure daily and record, calling PCP for findings outside established parameters.   Reviewed scheduled/upcoming provider appointments including: 04-16-2020 at 11 am Patient Goals/Self-Care Activities  Over the next 120 days, patient will:  - UNABLE to independently manage HTN Self administers medications as  prescribed Attends all scheduled provider appointments Calls provider office for new concerns, questions, or BP outside discussed parameters Checks BP and records as discussed Follows a low sodium diet/DASH diet - blood pressure equipment and technique reviewed - blood pressure trends reviewed - depression screen reviewed - home or ambulatory blood pressure monitoring encouraged  Follow Up Plan: Telephone follow up appointment with care management team member scheduled for: 05-22-2020 at 1 pm   Task: RNCM: Identify and Monitor Blood Pressure Elevation   Note:   Care Management Activities:    - blood pressure equipment and technique reviewed - blood pressure trends reviewed - depression screen reviewed - home or ambulatory blood pressure monitoring encouraged       Patient Care Plan: RNCM: Management of HLD    Problem Identified: RNCM: HLD Health Promotion or Disease Self-Management (General Plan of Care)   Priority: Medium    Goal: RNCM;HLD Self-Management Plan Developed   Priority: Medium  Note:   Current Barriers:   Poorly controlled hyperlipidemia, complicated by OSA,HTN, CAD, HLD, CVA  Current antihyperlipidemic regimen: Atorvastatin 80 mg QD  Most recent lipid panel:     Component Value Date/Time   CHOL 127 10/12/2019 0810   TRIG 123 10/12/2019 0810   HDL 41 10/12/2019 0810   CHOLHDL 3.1 10/12/2019 0810   VLDL 25 07/24/2018 0458   LDLCALC 66 10/12/2019 0810     ASCVD risk enhancing conditions: age >44, HTN, CHF  Unable to independently manage HLD  Lacks social connections  Does not contact provider office for questions/concerns  RN Care Manager Clinical Goal(s):   Over the next 120 days, patient will work with Consulting civil engineer, providers, and care team towards execution of  optimized self-health management plan  Over the next 120 days, patient will verbalize understanding of plan for HLD  Over the next 120 days, patient will work with [facility] and pcp to  address needs related to HLD  Over the next 120 days, patient will attend all scheduled medical appointments: 04-16-2020 at 11 am  Interventions:  Collaboration with Olin Hauser, DO regarding development and update of comprehensive plan of care as evidenced by provider attestation and co-signature  Inter-disciplinary care team collaboration (see longitudinal plan of care)  Medication review performed; medication list updated in electronic medical record.   Inter-disciplinary care team collaboration (see longitudinal plan of care)  Referred to pharmacy team for assistance with HLD medication management  Evaluation of current treatment plan related to HLD and patient's adherence to plan as established by provider. 03-13-2020: The patient is doing well and denies any new concerns with management of HLD  Advised patient to call the office for changes in condition or questions about chronic conditions   Provided education to patient re: Heart Healthy Diet and managing weight.  03-13-2020: The patient denies any issues related to dietary restrictions or changes in weight. Says his appetite is good.   Reviewed medications with patient and discussed compliance. 03-13-2020: Endorses compliance with current medications regimen   Discussed plans with patient for ongoing care management follow up and provided patient with direct contact information for care management team  Reviewed scheduled/upcoming provider appointments including: 04-16-2020 11 am   Patient Goals/Self-Care Activities:  Over the next 120 days, patient will:   - call for medicine refill 2 or 3 days before it runs out - call if I am sick and can't take my medicine - keep a list of all the medicines I take; vitamins and herbals too - learn to read medicine labels - use a pillbox to sort medicine - use an alarm clock or phone to remind me to take my medicine - change to whole grain breads, cereal, pasta - drink 6 to 8  glasses of water each day - eat 5 or 6 small meals each day - limit fast food meals to no more than 1 per week - prepare main meal at home 3 to 5 days each week - read food labels for fat, fiber, carbohydrates and portion size - be open to making changes - I can manage, know and watch for signs of a heart attack - if I have chest pain, call for help - learn about small changes that will make a big difference - learn my personal risk factors - barriers to meeting goals identified - choices provided - collaboration with team encouraged - decision-making supported - difficulty of making life-long changes acknowledged - health risks reviewed - problem-solving facilitated - questions answered - readiness for change evaluated - reassurance provided - resources needed to meet goals identified - self-reflection promoted - self-reliance encouraged   Follow Up Plan: Telephone follow up appointment with care management team member scheduled for: 05-22-2020 at 1 pm     Task: RNCM: HLD Mutually Develop and Royce Macadamia Achievement of Patient Goals   Note:   Care Management Activities:    - barriers to meeting goals identified - choices provided - collaboration with team encouraged - decision-making supported - difficulty of making life-long changes acknowledged - health risks reviewed - problem-solving facilitated - questions answered - readiness for change evaluated - reassurance provided - resources needed to meet goals identified - self-reflection promoted - self-reliance encouraged  Patient Care Plan: PharmD - Med Management    Problem Identified: Disease Progression     Long-Range Goal: Disease Progression Prevented or Minimized   Start Date: 02/04/2020  Expected End Date: 05/04/2020  Recent Progress: On track  Priority: High  Note:   Current Barriers:   Chronic Disease Management support, education, and care coordination needs related to CAD, HTN, HLD, and hx  CVA  Pharmacist Clinical Goal(s):   Over the next 90 days, patient will adhere to plan to optimize therapeutic regimen for hypothyroidism as evidenced by report of adherence to recommended medication management changes through collaboration with PharmD and provider.     Interventions:  1:1 collaboration with Olin Hauser, DO regarding development and update of comprehensive plan of care as evidenced by provider attestation and co-signature  Inter-disciplinary care team collaboration (see longitudinal plan of care)  Hypothyroidism  Current treatment: levothyroxine 125 mcg daily before breakfast Lab Results  Component Value Date   TSH 7.25 (H) 12/12/2019 o  Note patient's levothyroxine dose increased on 12/6 to levothyroxine 125 mcg daily before breakfast  Follow up with patient/wife today regarding levothyroxine administration o Report patient taking levothyroxine consistently in the morning on an empty stomach, at least 30 minutes before food.  o Reports now consistently separating levothyroxine by at least 4 hours from calcium- or iron-containing products using new weekly pill organizer - Note patient's wife previously reported had been administering calcium-containing multivitamin at the same time as levothyroxine  Will follow up with PCP regarding order for next thyroid function lab  Hypertension  Denies checking home BP readings recently  Discuss rational for/encourage caregiver to continue to monitor home BP 1-2 times/week, keep log of results and call providers for readings outside of established parameters.   Patient states doing a home exercise regimen (received from rehab) 6 days/week.  o Encourage patient to follow up with PCP - States that he continues to have shortness of breath with exertion and would like to discuss this with PCP. States that this is has been a chronic issue, not a new concern - Note next visit with PCP scheduled for 4/6 - Patient  states that he will call to office to schedule sooner appointment to discuss  Patient Goals/Self-Care Activities  Over the next 90 days, patient will:  - take medications as prescribed with assistance of wife   Note: patient uses weekly pillbox as filled by wife - check blood pressure, document, and provide at future appointments  Follow Up Plan: Telephone follow up appointment with care management team member scheduled for: 5/18 at 11:15 am    Patient Care Plan: RNCM: Fall Risk (Adult)    Problem Identified: RNCM: Fall Risk   Priority: High    Long-Range Goal: RNCM: Absence of Fall and Fall-Related Injury   Priority: High  Note:   Current Barriers:   Knowledge Deficits related to fall precautions in patient with multiple chronic conditions  Decreased adherence to prescribed treatment for fall prevention  Lacks social connections  Does not contact provider office for questions/concerns  Does not call office and report new falls when they occur  Knowledge Deficits related to how to obtain handicap sticker to use with his car  Chronic Disease Management support and education needs related to falls prevention and safety in patient with frequent falls due to unsteady gain and impaired mobility  Lacks caregiver support.  Clinical Goal(s):   patient will demonstrate improved adherence to prescribed treatment plan for decreasing falls as evidenced by  patient reporting and review of EMR  patient will verbalize using fall risk reduction strategies discussed  patient will not experience additional falls  patient will verbalize understanding of plan for fall prevention and safety in the home  patient will work with pcp, [facility] staff to address needs related to getting paperwork for handicap sticker for the patients care   patient will attend all scheduled medical appointments: 04-26-2020 at 11 am Interventions:   Collaboration with Olin Hauser, DO regarding  development and update of comprehensive plan of care as evidenced by provider attestation and co-signature  Inter-disciplinary care team collaboration (see longitudinal plan of care)  Provided written and verbal education re: Potential causes of falls and Fall prevention strategies  Reviewed medications and discussed potential side effects of medications such as dizziness and frequent urination  Assessed for s/s of orthostatic hypotension. 03-13-2020: The patient denies any issues with blood pressure drops  Assessed for falls since last encounter. 03-13-2020: The patient had a fall last week. The patient fell when he stepped off of a curb when leaving a restaurant.  Is interested in a handicap sticker for his car, will discuss with the pcp. See separate care plan for falls prevention and safety. 03-14-2020: Call made to the patient. The patient can go by the office and pick up the DVM handicap sticker paperwork to take to the [facility].  The patient states he cannot come by until Monday to get the paperwork. Office staff notified.   Assessed patients knowledge of fall risk prevention secondary to previously provided education. 03-13-2020: Reviewed today wit the patient   Assessed working status of life alert bracelet and patient adherence  Provided patient information for fall alert systems  Evaluation of current treatment plan related to falls prevention and safety  and patient's adherence to plan as established by provider.  Advised patient to call the office for new falls, seek emergent help if needed for falls with injury and work with [facility] for fall prevention and safety   Provided education to patient re: being safe and monitoring for potential high fall risk areas, using DME to help with falls, reporting falls.   Collaborated with pcp and [facility] clinical staff regarding the patient request for a handicap sticker for his car so he will not have to walk long distances. 03-14-2020: The patient notified that  the paperwork was ready for pick up at the front office.  The patient states he will come by Monday to get. Office staff aware.   Reviewed scheduled/upcoming provider appointments including:   Discussed plans with patient for ongoing care management follow up and provided patient with direct contact information for care management team Self-Care Deficits:  Unable to independently manage falls as evidence of fall last week when leaving a restaurant  Lacks social connections Does not contact provider office for questions/concerns Patient Goals:  - Utilize cane (assistive device) appropriately with all ambulation- states he has misplaced his cane but is actively looking for it.  - De-clutter walkways - Change positions slowly - Wear secure fitting shoes at all times with ambulation - Utilize home lighting for dim lit areas - Demonstrate self and pet awareness at all times - activities of daily living skills assessed - assistive or adaptive device use encouraged - barriers to physical activity or exercise addressed - barriers to physical activity or exercise identified - barriers to safety identified - cognition assessed - cognitive-stimulating activities promoted - fall prevention plan reviewed and updated - fear of falling, loss  of independence and pain acknowledged - medication list reviewed - modification of home and work environment promoted Follow Up Plan: Telephone follow up appointment with care management team member scheduled for: 05-22-2020 at 1 pm   Task: RNCM: Identify and Manage Contributors to Fall Risk   Note:   Care Management Activities:    - activities of daily living skills assessed - assistive or adaptive device use encouraged - barriers to physical activity or exercise addressed - barriers to physical activity or exercise identified - barriers to safety identified - cognition assessed - cognitive-stimulating activities promoted - fall prevention plan reviewed  and updated - fear of falling, loss of independence and pain acknowledged - medication list reviewed - modification of home and work environment promoted         Patient verbalizes understanding of instructions provided today and agrees to view in [place].   Telephone follow up appointment with care management team member scheduled for: 05-22-2020 at 1 pm  Noreene Larsson RN, MSN, [place] Mobile: [phone]

## 2020-03-14 NOTE — Chronic Care Management (AMB) (Signed)
Chronic Care Management   CCM RN Visit Note  03/14/2020 Name: Luke Mckee MRN: [id number] DOB: 06-23-1940  Subjective: Luke Mckee is a 80 y.o. year old male who is a primary care patient of [name], DO. The care management team was consulted for assistance with disease management and care coordination needs.    Engaged with patient by telephone for follow up visit in response to provider referral for case management and/or care coordination services.   Consent to Services:  The patient was given information about Chronic Care Management services, agreed to services, and gave verbal consent prior to initiation of services.  Please see initial visit note for detailed documentation.   Patient agreed to services and verbal consent obtained.   Assessment: Review of patient past medical history, allergies, medications, health status, including review of consultants reports, laboratory and other test data, was performed as part of comprehensive evaluation and provision of chronic care management services.   SDOH (Social Determinants of Health) assessments and interventions performed:    CCM Care Plan  Allergies  Allergen Reactions  . Ace Inhibitors   . Cephalexin     Vomiting and diarrhea  . Crestor [Rosuvastatin Calcium] Other (See Comments)    myalgia  . Tape Rash    blistering    Outpatient Encounter Medications as of 03/14/2020  Medication Sig  . acetaminophen (TYLENOL 8 HOUR) 650 MG CR tablet Take 1,300 mg by mouth at bedtime.   Marland Kitchen aspirin 81 MG chewable tablet Chew 81 mg by mouth daily.  Marland Kitchen atenolol (TENORMIN) 50 MG tablet Take 1 tablet (50 mg total) by mouth at bedtime.  Marland Kitchen atorvastatin (LIPITOR) 80 MG tablet Take 1 tablet (80 mg total) by mouth daily.  . cetirizine (ZYRTEC) 10 MG tablet Take 10 mg by mouth at bedtime.   . Cholecalciferol (VITAMIN D) 50 MCG (2000 UT) tablet Take 4,000 Units by mouth daily.  . clopidogrel (PLAVIX) 75 MG tablet Take 1 tablet (75 mg  total) by mouth daily.  . diclofenac sodium (VOLTAREN) 1 % GEL Apply 2 g topically 4 (four) times daily as needed for pain. Foot pain  . docusate sodium (COLACE) 100 MG capsule Take 200 mg by mouth daily.   . furosemide (LASIX) 80 MG tablet Take 1 tablet (80 mg total) by mouth 2 (two) times daily.  Marland Kitchen ipratropium (ATROVENT) 0.06 % nasal spray USE 2 SPRAYS IN EACH NOSTRIL THREE TIMES DAILY AS NEEDED FOR RHINITIS  . isosorbide mononitrate (IMDUR) 30 MG 24 hr tablet TAKE 1 TABLET(30 MG) BY MOUTH DAILY  . levothyroxine (SYNTHROID) 125 MCG tablet Take 1 tablet (125 mcg total) by mouth daily before breakfast.  . loratadine (CLARITIN) 10 MG tablet Take 10 mg by mouth daily.  Marland Kitchen LORazepam (ATIVAN) 0.5 MG tablet Take 1 tablet (0.5 mg total) by mouth daily as needed for anxiety or sleep.  . metroNIDAZOLE (METROCREAM) 0.75 % cream Apply topically in the morning and at bedtime.  . Multiple Vitamin (MULTIVITAMIN) tablet Take 1 tablet by mouth daily.  . nitroGLYCERIN (NITROSTAT) 0.4 MG SL tablet Place 1 tablet (0.4 mg total) under the tongue every 5 (five) minutes as needed for chest pain.  Marland Kitchen PATADAY 0.1 % ophthalmic solution Place into both eyes as needed.   . senna (SENOKOT) 8.6 MG tablet Take 1 tablet by mouth daily.  . sertraline (ZOLOFT) 100 MG tablet Take 1.5 tablets (150 mg total) by mouth daily.  Marland Kitchen spironolactone (ALDACTONE) 25 MG tablet TAKE 1/2 TABLET(12.5 MG) BY MOUTH  DAILY   No facility-administered encounter medications on file as of 03/14/2020.    Patient Active Problem List   Diagnosis Date Noted  . Hallux limitus, acquired, right 11/12/2019  . Pre-diabetes 10/17/2019  . Recurrent major depressive disorder, in partial remission (HCC) 04/16/2019  . Morbid obesity (HCC) 04/16/2019  . Coronary artery disease involving coronary bypass graft of native heart without angina pectoris 04/16/2019  . Vasomotor rhinitis 10/25/2018  . Hearing loss 08/28/2018  . Osteoarthritis 08/28/2018  . Tinnitus  08/28/2018  . 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 (HCC) 07/31/2018  . Stroke (HCC) 07/24/2018  . TIA (transient ischemic attack) 07/23/2018  . Slurred speech 07/23/2018  . Right arm weakness 07/23/2018  . Hypokalemia 07/23/2018  . Anxiety 07/20/2018  . Hx of CABG 06/16/2018  . Mixed hyperlipidemia 06/16/2018  . Benign essential HTN 06/16/2018  . History of cerebrovascular accident (CVA) with residual deficit 06/07/2018  . Systolic CHF (HCC) 02/15/2018  . Hypertension 02/15/2018  . S/P total knee arthroplasty, right 02/15/2018  . Primary osteoarthritis of both knees 11/25/2017  . Abnormal nuclear stress test 06/30/2016  . Hallux limitus of right foot 10/22/2015  . Anesthesia complication 10/10/2015  . GERD (gastroesophageal reflux disease) 10/10/2015  . Hypothyroidism 10/10/2015  . OSA on CPAP 01/10/2007  . Diagnosis unknown 01/12/2003    Conditions to be addressed/monitored:HTN, HLD and fall risk- high- need handicap sticker  Care Plan : RNCM: Fall Risk (Adult)  Updates made by [name] since 03/14/2020 12:00 AM    Problem: RNCM: Fall Risk   Priority: High    Long-Range Goal: RNCM: Absence of Fall and Fall-Related Injury   Priority: High  Note:   Current Barriers:  Marland Kitchen Knowledge Deficits related to fall precautions in patient with multiple chronic conditions . Decreased adherence to prescribed treatment for fall prevention . Lacks social connections . Does not contact provider office for questions/concerns . Does not call office and report new falls when they occur . Knowledge Deficits related to how to obtain handicap sticker to use with his car . Chronic Disease Management support and education needs related to falls prevention and safety in patient with frequent falls due to unsteady gain and impaired mobility . Lacks caregiver support.  Clinical Goal(s):  . patient will demonstrate improved adherence to prescribed treatment plan for  decreasing falls as evidenced by patient reporting and review of EMR . patient will verbalize using fall risk reduction strategies discussed . patient will not experience additional falls . patient will verbalize understanding of plan for fall prevention and safety in the home . patient will work with pcp, [facility] staff to address needs related to getting paperwork for handicap sticker for the patients care  . patient will attend all scheduled medical appointments: 04-26-2020 at 11 am Interventions:  . Collaboration with [name], DO regarding development and update of comprehensive plan of care as evidenced by provider attestation and co-signature . Inter-disciplinary care team collaboration (see longitudinal plan of care) . Provided written and verbal education re: Potential causes of falls and Fall prevention strategies . Reviewed medications and discussed potential side effects of medications such as dizziness and frequent urination . Assessed for s/s of orthostatic hypotension. 03-13-2020: The patient denies any issues with blood pressure drops . Assessed for falls since last encounter. 03-13-2020: The patient had a fall last week. The patient fell when he stepped off of a curb when leaving a restaurant.  Is interested in a handicap sticker for  his car, will discuss with the pcp. See separate care plan for falls prevention and safety. 03-14-2020: Call made to the patient. The patient can go by the office and pick up the DVM handicap sticker paperwork to take to the [facility].  The patient states he cannot come by until Monday to get the paperwork. Office staff notified.  . Assessed patients knowledge of fall risk prevention secondary to previously provided education. 03-13-2020: Reviewed today wit the patient  . Assessed working status of life alert bracelet and patient adherence . Provided patient information for fall alert systems . Evaluation of current treatment plan related to falls  prevention and safety  and patient's adherence to plan as established by provider. . Advised patient to call the office for new falls, seek emergent help if needed for falls with injury and work with [facility] for fall prevention and safety  . Provided education to patient re: being safe and monitoring for potential high fall risk areas, using DME to help with falls, reporting falls.  Marland Kitchen Collaborated with pcp and [facility] clinical staff regarding the patient request for a handicap sticker for his car so he will not have to walk long distances. 03-14-2020: The patient notified that the paperwork was ready for pick up at the front office.  The patient states he will come by Monday to get. Office staff aware.  . Reviewed scheduled/upcoming provider appointments including:  . Discussed plans with patient for ongoing care management follow up and provided patient with direct contact information for care management team Self-Care Deficits:  Unable to independently manage falls as evidence of fall last week when leaving a restaurant  Lacks social connections Does not contact provider office for questions/concerns Patient Goals:  - Utilize cane (assistive device) appropriately with all ambulation- states he has misplaced his cane but is actively looking for it.  - De-clutter walkways - Change positions slowly - Wear secure fitting shoes at all times with ambulation - Utilize home lighting for dim lit areas - Demonstrate self and pet awareness at all times - activities of daily living skills assessed - assistive or adaptive device use encouraged - barriers to physical activity or exercise addressed - barriers to physical activity or exercise identified - barriers to safety identified - cognition assessed - cognitive-stimulating activities promoted - fall prevention plan reviewed and updated - fear of falling, loss of independence and pain acknowledged - medication list reviewed - modification of home and work  environment promoted Follow Up Plan: Telephone follow up appointment with care management team member scheduled for: 05-22-2020 at 1 pm     Plan:Telephone follow up appointment with care management team member scheduled for:  05-22-2020 at 1 pm  Alto Denver RN, MSN, CCM Community Care Coordinator [place]  [facility] [place] Mobile: [phone]

## 2020-03-15 ENCOUNTER — Other Ambulatory Visit: Payer: Self-pay | Admitting: Family Medicine

## 2020-03-15 DIAGNOSIS — E039 Hypothyroidism, unspecified: Secondary | ICD-10-CM

## 2020-03-17 ENCOUNTER — Ambulatory Visit: Payer: Self-pay | Admitting: Pharmacist

## 2020-03-17 ENCOUNTER — Ambulatory Visit (INDEPENDENT_AMBULATORY_CARE_PROVIDER_SITE_OTHER): Payer: Medicare PPO

## 2020-03-17 DIAGNOSIS — I10 Essential (primary) hypertension: Secondary | ICD-10-CM | POA: Diagnosis not present

## 2020-03-17 DIAGNOSIS — E039 Hypothyroidism, unspecified: Secondary | ICD-10-CM

## 2020-03-17 DIAGNOSIS — I5022 Chronic systolic (congestive) heart failure: Secondary | ICD-10-CM

## 2020-03-17 DIAGNOSIS — I63412 Cerebral infarction due to embolism of left middle cerebral artery: Secondary | ICD-10-CM | POA: Diagnosis not present

## 2020-03-17 DIAGNOSIS — E782 Mixed hyperlipidemia: Secondary | ICD-10-CM

## 2020-03-17 NOTE — Patient Instructions (Addendum)
Visit Information  PATIENT GOALS: Goals Addressed            This Visit's Progress   . Pharmacy Goals       It was great talking with you today!  Please check your blood pressure 1-2 times/week at home and keep a record to bring to medical appointments  Our goal bad cholesterol, or LDL, is less than 70 . This is why it is important to continue taking your atorvastatin  Feel free to call me with any questions or concerns. I look forward to our next call!   Duanne Moron, PharmD, BCACP Clinical Pharmacist [facility] [phone]         The patient verbalized understanding of instructions, educational materials, and care plan provided today and declined offer to receive copy of patient instructions, educational materials, and care plan.   Telephone follow up appointment with care management team member scheduled for: 05/28/2020 at 11:15 AM  Duanne Moron, PharmD, [facility] Clinical Pharmacist [facility] [phone]

## 2020-03-17 NOTE — Chronic Care Management (AMB) (Signed)
Chronic Care Management Pharmacy Note  03/17/2020 Name:  Luke Mckee MRN:  [id number] DOB:  May 11, 1940  Subjective: Luke Mckee is an 80 y.o. year old male who is a primary patient of Olin Hauser, DO.  The CCM team was consulted for assistance with disease management and care coordination needs.    Engaged with patient and spouse by telephone for follow up visit in response to provider referral for pharmacy case management and/or care coordination services.   Consent to Services:  The patient was given information about Chronic Care Management services, agreed to services, and gave verbal consent prior to initiation of services.  Please see initial visit note for detailed documentation.   Patient Care Team: Olin Hauser, DO as PCP - General (Family Medicine) Breeonna Mone, Virl Diamond, [place] as Pharmacist Vanita Ingles, RN as Case Manager (General Practice)  Recent office visits: none  Hospital visits: None in previous 6 months  Objective:  Lab Results  Component Value Date   CREATININE 0.99 10/12/2019   CREATININE 0.81 08/03/2018   CREATININE 0.84 07/25/2018       Component Value Date/Time   CHOL 127 10/12/2019 0810   TRIG 123 10/12/2019 0810   HDL 41 10/12/2019 0810   CHOLHDL 3.1 10/12/2019 0810   VLDL 25 07/24/2018 0458   LDLCALC 66 10/12/2019 0810    Hepatic Function Latest Ref Rng & Units 10/12/2019 07/24/2018 07/23/2018  Total Protein 6.1 - 8.1 g/dL 6.9 6.6 7.3  Albumin 3.5 - 5.0 g/dL - 3.4(L) 3.8  AST 10 - 35 U/L $Remo'16 17 21  'wRIgm$ ALT 9 - 46 U/L $Remo'14 17 20  'Kkhkz$ Alk Phosphatase 38 - 126 U/L - 47 54  Total Bilirubin 0.2 - 1.2 mg/dL 0.4 0.8 0.7    Lab Results  Component Value Date/Time   TSH 7.25 (H) 12/12/2019 10:04 AM   TSH 8.16 (H) 10/12/2019 08:10 AM   FREET4 1.1 12/12/2019 10:04 AM   FREET4 1.0 10/12/2019 08:10 AM    CBC Latest Ref Rng & Units 10/12/2019 07/24/2018 07/23/2018  WBC 3.8 - 10.8 Thousand/uL 7.1 7.3 7.0  Hemoglobin 13.2 - 17.1 g/dL  13.6 12.6(L) 13.4  Hematocrit 38.5 - 50.0 % 41.4 37.1(L) 40.3  Platelets 140 - 400 Thousand/uL 228 201 210    Social History   Tobacco Use  Smoking Status Never Smoker  Smokeless Tobacco Never Used   BP Readings from Last 3 Encounters:  12/18/19 100/62  10/17/19 118/70  07/17/19 132/72   Pulse Readings from Last 3 Encounters:  12/18/19 71  10/17/19 69  07/17/19 72   Wt Readings from Last 3 Encounters:  12/18/19 247 lb (112 kg)  10/17/19 248 lb (112.5 kg)  07/17/19 246 lb (111.6 kg)    Assessment: Review of patient past medical history, allergies, medications, health status, including review of consultants reports, laboratory and other test data, was performed as part of comprehensive evaluation and provision of chronic care management services.   SDOH:  (Social Determinants of Health) assessments and interventions performed: none   CCM Care Plan  Allergies  Allergen Reactions  . Ace Inhibitors   . Cephalexin     Vomiting and diarrhea  . Crestor [Rosuvastatin Calcium] Other (See Comments)    myalgia  . Tape Rash    blistering    Medications Reviewed Today    Reviewed by Vanita Ingles on 03/13/20 at 1325  Med List Status: <None>  Medication Order Taking? Sig Documenting Provider Last Dose Status Informant  acetaminophen (  TYLENOL 8 HOUR) 650 MG CR tablet [id number] No Take 1,300 mg by mouth at bedtime.  [provider] Taking Active Spouse/Significant Other  aspirin 81 MG chewable tablet [id number] No Chew 81 mg by mouth daily. [provider] Taking Active   atenolol (TENORMIN) 50 MG tablet [id number] No Take 1 tablet (50 mg total) by mouth at bedtime. [name], DO Taking Active   atorvastatin (LIPITOR) 80 MG tablet [id number] No Take 1 tablet (80 mg total) by mouth daily. [name], DO Taking Active   cetirizine (ZYRTEC) 10 MG tablet [id number] No Take 10 mg by mouth at bedtime.  [provider] Taking Active  Spouse/Significant Other  Cholecalciferol (VITAMIN D) 50 MCG (2000 UT) tablet [id number] No Take 4,000 Units by mouth daily. [provider] Taking Active Spouse/Significant Other  clopidogrel (PLAVIX) 75 MG tablet [id number] No Take 1 tablet (75 mg total) by mouth daily. [name], MD Taking Active Spouse/Significant Other  diclofenac sodium (VOLTAREN) 1 % GEL [id number] No Apply 2 g topically 4 (four) times daily as needed for pain. Foot pain [provider] Taking Active Spouse/Significant Other  docusate sodium (COLACE) 100 MG capsule [id number] No Take 200 mg by mouth daily.  [provider] Taking Active Spouse/Significant Other  furosemide (LASIX) 80 MG tablet [id number] No Take 1 tablet (80 mg total) by mouth 2 (two) times daily. [name], DO Taking Active   ipratropium (ATROVENT) 0.06 % nasal spray [id number] No USE 2 SPRAYS IN EACH NOSTRIL THREE TIMES DAILY AS NEEDED FOR RHINITIS [name] Netta [name], DO Taking Active   isosorbide mononitrate (IMDUR) 30 MG 24 hr tablet [id number]  TAKE 1 TABLET(30 MG) BY MOUTH DAILY [name], Netta [name], DO  Active   levothyroxine (SYNTHROID) 125 MCG tablet [id number] No Take 1 tablet (125 mcg total) by mouth daily before breakfast. [name], DO Taking Active   loratadine (CLARITIN) 10 MG tablet [id number] No Take 10 mg by mouth daily. [provider] Taking Active   LORazepam (ATIVAN) 0.5 MG tablet [id number] No Take 1 tablet (0.5 mg total) by mouth daily as needed for anxiety or sleep. [name], DO Taking Active Spouse/Significant Other  metroNIDAZOLE (METROCREAM) 0.75 % cream [id number] No Apply topically in the morning and at bedtime. [name], MD Taking Active   Multiple Vitamin (MULTIVITAMIN) tablet [id number] No Take 1 tablet by mouth daily. [provider] Taking Active Spouse/Significant Other  nitroGLYCERIN (NITROSTAT) 0.4 MG SL tablet [id number]  No Place 1 tablet (0.4 mg total) under the tongue every 5 (five) minutes as needed for chest pain. [name], DO Taking Active   PATADAY 0.1 % ophthalmic solution [id number] No Place into both eyes as needed.  [provider] Taking Active   senna (SENOKOT) 8.6 MG tablet [id number] No Take 1 tablet by mouth daily. [provider] Taking Active   sertraline (ZOLOFT) 100 MG tablet [id number] No Take 1.5 tablets (150 mg total) by mouth daily. [name], DO Taking Active   spironolactone (ALDACTONE) 25 MG tablet [id number]  TAKE 1/2 TABLET(12.5 MG) BY MOUTH DAILY [name] Netta [name], DO  Active           Patient Active Problem List   Diagnosis Date Noted  . Hallux limitus, acquired, right 11/12/2019  . Pre-diabetes 10/17/2019  . Recurrent major depressive disorder, in partial remission (HCC) 04/16/2019  . Morbid obesity (HCC) 04/16/2019  . Coronary artery disease involving coronary bypass graft of native heart  without angina pectoris 04/16/2019  . Vasomotor rhinitis 10/25/2018  . Hearing loss 08/28/2018  . Osteoarthritis 08/28/2018  . Tinnitus 08/28/2018  . 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 ([place]) 07/31/2018  . Stroke ([place]) 07/24/2018  . TIA (transient ischemic attack) 07/23/2018  . Slurred speech 07/23/2018  . Right arm weakness 07/23/2018  . Hypokalemia 07/23/2018  . Anxiety 07/20/2018  . Hx of CABG 06/16/2018  . Mixed hyperlipidemia 06/16/2018  . Benign essential HTN 06/16/2018  . History of cerebrovascular accident (CVA) with residual deficit 06/07/2018  . Systolic CHF ([place]) 08/65/7846  . Hypertension 02/15/2018  . S/P total knee arthroplasty, right 02/15/2018  . Primary osteoarthritis of both knees 11/25/2017  . Abnormal nuclear stress test 06/30/2016  . Hallux limitus of right foot 10/22/2015  . Anesthesia complication 96/29/5284  . GERD (gastroesophageal reflux disease) 10/10/2015  .  Hypothyroidism 10/10/2015  . OSA on CPAP 01/10/2007  . Diagnosis unknown 01/12/2003    Immunization History  Administered Date(s) Administered  . Influenza, High Dose Seasonal PF 09/05/2018  . Influenza-Unspecified 09/25/2019  . Moderna Sars-Covid-2 Vaccination 02/23/2019, 03/24/2019, 11/14/2019  . Pneumococcal Conjugate-13 11/08/2014  . Pneumococcal Polysaccharide-23 11/23/2000, 03/01/2008  . Td 03/01/2008  . Zoster Recombinat (Shingrix) 06/25/2009    Conditions to be addressed/monitored: HTN, HLD, hypothyroidism  Care Plan : PharmD - Med Management  Updates made by Vella Raring, [place] since 03/17/2020 12:00 AM    Problem: Disease Progression     Long-Range Goal: Disease Progression Prevented or Minimized   Start Date: 02/04/2020  Expected End Date: 05/04/2020  Recent Progress: On track  Priority: High  Note:   Current Barriers:  . Chronic Disease Management support, education, and care coordination needs related to CAD, HTN, HLD, and hx CVA  Pharmacist Clinical Goal(s):  Marland Kitchen Over the next 90 days, patient will adhere to plan to optimize therapeutic regimen for hypothyroidism as evidenced by report of adherence to recommended medication management changes through collaboration with PharmD and provider.  .   Interventions: . 1:1 collaboration with Olin Hauser, DO regarding development and update of comprehensive plan of care as evidenced by provider attestation and co-signature . Inter-disciplinary care team collaboration (see longitudinal plan of care)  Hypothyroidism . Current treatment: levothyroxine 125 mcg daily before breakfast . Collaborated with PCP on 2/18 to recommend follow up thyroid function lab work as levothyroxine dose last adjusted on 12/6 (increased from levothyroxine 112 mcg daily to 125 mcg daily) o Provider agreed and lab work ordered o From review of chart, note patient contacted by office, but not successfully reached/lab not  completed . Follow up with patient/wife today. Encourage to call office to schedule appointment for thyroid lab work o Note per PCP, patient can also schedule Virtual Visit if desired to review results o Next Office Visit with PCP scheduled for 4/6  Medication Management: . Today patient/spouse report patient had recent surgery to place a dental implant and was started on 7-day course of Augmentin 500-125 mg TID on 3/4 o Reports patient had nausea and diarrhea at start of course, but that side effects have improved o Encourage patient to take doses with food to help with GI upset and to complete course  o To call provider for new or worsening symptoms/side effects  Patient Goals/Self-Care Activities . Over the next 90 days, patient will:  - take medications as prescribed with assistance of wife   Note: patient uses weekly pillbox as filled by wife - check blood pressure, document,  and provide at future appointments  Follow Up Plan: Telephone follow up appointment with care management team member scheduled for: 5/18 at 11:15 am      Medication Assistance: None required.  Patient affirms current coverage meets needs.  [name] preferred pharmacy is:  [id number] [place], [place] [address] [place] [address] Phone: [phone] Fax: [phone]  [facility] - [address], [place] [address] Phone: [phone] Fax: [phone]  Uses pill box? Yes  Follow Up:  Patient agrees to Care Plan and Follow-up.  Plan: Telephone follow up appointment with care management team member scheduled for:  5/18 at 11:15 am  Harlow Asa, PharmD, [place] [phone]

## 2020-03-18 ENCOUNTER — Other Ambulatory Visit: Payer: Self-pay | Admitting: *Deleted

## 2020-03-18 DIAGNOSIS — E039 Hypothyroidism, unspecified: Secondary | ICD-10-CM

## 2020-03-19 ENCOUNTER — Other Ambulatory Visit: Payer: Medicare PPO

## 2020-03-19 ENCOUNTER — Other Ambulatory Visit: Payer: Self-pay

## 2020-03-19 DIAGNOSIS — E039 Hypothyroidism, unspecified: Secondary | ICD-10-CM | POA: Diagnosis not present

## 2020-03-19 LAB — CUP PACEART REMOTE DEVICE CHECK
Date Time Interrogation Session: 20220228233453
Implantable Pulse Generator Implant Date: 20200714

## 2020-03-20 LAB — TSH: TSH: 1.84 mIU/L (ref 0.40–4.50)

## 2020-03-20 LAB — T4, FREE: Free T4: 1.5 ng/dL (ref 0.8–1.8)

## 2020-03-25 NOTE — Progress Notes (Signed)
Carelink Summary Report / Loop Recorder 

## 2020-03-31 DIAGNOSIS — G4733 Obstructive sleep apnea (adult) (pediatric): Secondary | ICD-10-CM | POA: Diagnosis not present

## 2020-04-03 ENCOUNTER — Other Ambulatory Visit: Payer: Self-pay | Admitting: Family Medicine

## 2020-04-03 ENCOUNTER — Other Ambulatory Visit: Payer: Self-pay | Admitting: Cardiovascular Disease

## 2020-04-03 DIAGNOSIS — I25810 Atherosclerosis of coronary artery bypass graft(s) without angina pectoris: Secondary | ICD-10-CM

## 2020-04-03 DIAGNOSIS — I10 Essential (primary) hypertension: Secondary | ICD-10-CM

## 2020-04-03 NOTE — Telephone Encounter (Signed)
Rx request sent to pharmacy.  

## 2020-04-03 NOTE — Telephone Encounter (Signed)
Requested Prescriptions  Pending Prescriptions Disp Refills  . atenolol (TENORMIN) 50 MG tablet [Pharmacy Med Name: ATENOLOL 50MG  TABLETS] 90 tablet 0    Sig: TAKE 1 TABLET(50 MG) BY MOUTH AT BEDTIME     Cardiovascular:  Beta Blockers Passed - 04/03/2020  3:16 AM      Passed - Last BP in normal range    BP Readings from Last 1 Encounters:  12/18/19 100/62         Passed - Last Heart Rate in normal range    Pulse Readings from Last 1 Encounters:  12/18/19 71         Passed - Valid encounter within last 6 months    Recent Outpatient Visits          5 months ago Annual physical exam   [facility], DO   11 months ago Anxiety   [facility] [place], [place], DO   1 year ago Benign essential HTN   [facility] [place], [place], DO   1 year ago Hemiplegia of left nondominant side due to cerebrovascular disease [facility])   [facility], DO   1 year ago History of cerebrovascular accident (CVA) with residual deficit   [facility], [name], DO      Future Appointments            In 1 week Netta [name], [name], DO [facility]

## 2020-04-16 ENCOUNTER — Ambulatory Visit (INDEPENDENT_AMBULATORY_CARE_PROVIDER_SITE_OTHER): Payer: Medicare PPO | Admitting: Family Medicine

## 2020-04-16 ENCOUNTER — Other Ambulatory Visit: Payer: Self-pay

## 2020-04-16 ENCOUNTER — Encounter: Payer: Self-pay | Admitting: Family Medicine

## 2020-04-16 ENCOUNTER — Other Ambulatory Visit: Payer: Self-pay | Admitting: Family Medicine

## 2020-04-16 VITALS — BP 110/65 | HR 74 | Ht 68.0 in | Wt 247.6 lb

## 2020-04-16 DIAGNOSIS — I25118 Atherosclerotic heart disease of native coronary artery with other forms of angina pectoris: Secondary | ICD-10-CM

## 2020-04-16 DIAGNOSIS — R351 Nocturia: Secondary | ICD-10-CM

## 2020-04-16 DIAGNOSIS — R7303 Prediabetes: Secondary | ICD-10-CM

## 2020-04-16 DIAGNOSIS — I6930 Unspecified sequelae of cerebral infarction: Secondary | ICD-10-CM

## 2020-04-16 DIAGNOSIS — I679 Cerebrovascular disease, unspecified: Secondary | ICD-10-CM

## 2020-04-16 DIAGNOSIS — I10 Essential (primary) hypertension: Secondary | ICD-10-CM

## 2020-04-16 DIAGNOSIS — G8191 Hemiplegia, unspecified affecting right dominant side: Secondary | ICD-10-CM

## 2020-04-16 DIAGNOSIS — E039 Hypothyroidism, unspecified: Secondary | ICD-10-CM | POA: Diagnosis not present

## 2020-04-16 DIAGNOSIS — J3089 Other allergic rhinitis: Secondary | ICD-10-CM

## 2020-04-16 DIAGNOSIS — I5022 Chronic systolic (congestive) heart failure: Secondary | ICD-10-CM

## 2020-04-16 DIAGNOSIS — F3341 Major depressive disorder, recurrent, in partial remission: Secondary | ICD-10-CM

## 2020-04-16 DIAGNOSIS — Z0000 Encounter for general adult medical examination without abnormal findings: Secondary | ICD-10-CM

## 2020-04-16 DIAGNOSIS — Z23 Encounter for immunization: Secondary | ICD-10-CM

## 2020-04-16 MED ORDER — SHINGRIX 50 MCG/0.5ML IM SUSR
INTRAMUSCULAR | 1 refills | Status: DC
Start: 2020-04-16 — End: 2020-04-16

## 2020-04-16 MED ORDER — SHINGRIX 50 MCG/0.5ML IM SUSR: INTRAMUSCULAR | 1 refills | Status: DC

## 2020-04-16 MED ORDER — MONTELUKAST SODIUM 10 MG PO TABS: 10.0000 mg | ORAL_TABLET | Freq: Every day | ORAL | 3 refills | Status: DC

## 2020-04-16 NOTE — Patient Instructions (Addendum)
Thank you for coming to the office today.  Recommend COVID Booster #4th next when ready.  Shingles shot Shingrix 2 dose series at pharmacy when ready.  Printed rx.  Keep on current meds  Likely a balance with fluid / heart / blood pressure for dizzy can impact inner ear as well with allergies  STart generic singulair nightly, stop cetirizine or zyrtec, can keep taking claritin/loratadine.  DUE for FASTING BLOOD WORK (no food or drink after midnight before the lab appointment, only water or coffee without cream/sugar on the morning of)  SCHEDULE "Lab Only" visit in the morning at the clinic for lab draw in 6 MONTHS   - Make sure Lab Only appointment is at about 1 week before your next appointment, so that results will be available  For Lab Results, once available within 2-3 days of blood draw, you can can log in to MyChart online to view your results and a brief explanation. Also, we can discuss results at next follow-up visit.    Please schedule a Follow-up Appointment to: Return in about 6 months (around 10/16/2020) for 6 month follow-up fasting lab only then 1 week later Annual Physical.  If you have any other questions or concerns, please feel free to call the office or send a message through MyChart. You may also schedule an earlier appointment if necessary.  Additionally, you may be receiving a survey about your experience at our office within a few days to 1 week by e-mail or mail. We value your feedback.  Saralyn Pilar, DO [facility], [place]

## 2020-04-16 NOTE — Assessment & Plan Note (Signed)
Last lab normalized thyroid panel Continue on Levothyroxine daily

## 2020-04-16 NOTE — Assessment & Plan Note (Signed)
Well-controlled HTN - Home BP readings controlled  Known CAD, prior CVA, OSA    Plan:  1. Continue current BP regimen 2. Encourage improved lifestyle - low sodium diet, regular exercise 3. Continue monitor BP outside office, bring readings to next visit, if persistently >140/90 or new symptoms notify office sooner

## 2020-04-16 NOTE — Assessment & Plan Note (Addendum)
Stable CAD s/p CABG stents Without anginal symptoms on Imdur On med management now, w/ Cardiology

## 2020-04-16 NOTE — Progress Notes (Signed)
Subjective:    Patient ID: Luke Mckee, male    DOB: 01-Apr-1940, 80 y.o.   MRN: [id number]  Luke Mckee is a 80 y.o. male presenting on 04/16/2020 for Hypertension, Fatigue, and Dizziness   HPI   History of CVA with residual deficitR sided weakness /multiple L MCA / TIA history HTN CAD s/p CABG with stable angina on nitrate Followed byNeuro/Cards, see prior notes for background information. S/p loop recorder. No identified AFib. He had issue with stroke on Eliquis. Now he is back on DAPT Plavix + ASA 81mg  and doing better - Low BP by report, still taking diuretics due to edema per Cardiology- lasix 80 BID He takes imdur 30mg  daily and it controls CP. He has occasional dizziness mostly postural or movement related. Intermittent not consistent, not linked to low BP Has missed dose Lasix occasionally Denies any chest pain, new focal weakness, dyspnea, worsening edema, near syncope or syncope  Elevated A1c / PreDM Morbid Obesity BMI >37 Last result A1c 6.1 10/2019 Improved walking  Hypothyroidism Chronic problem Prior lab showed TSH elevated up to >8, normal Free T4 Last lab now 03/2020 showed normal Thyroid panel and T4 He is taking Levothyroxine 11/2019 daily, currently doing well - with some improvement after med adjustment of changing dosing so does not take other meds with levothyroxine.  Anxiety / Depression recurrent in partial remission Rarely bothering him.Has occasional mood symptoms. Doing well on Sertraline. Also for anxiety, takes Lorazepam rarely, has some left  OSA on CPAP Followed by Pulmonology, no repeat sleep study - Today reports that sleep apnea is well controlled. He uses the CPAP machine every night. Tolerates the machine well, and thinks that sleeps better with it and feels good. No new concerns or symptoms.  Seasonal Allergies Requesting additional therapy Cetirizine not working Failed nose spray before  Health Maintenance: Due for Shingrix  vaccine series. Will go to pharmacy.  Depression screen [facility] 2/9 04/16/2020 10/17/2019 07/17/2019  Decreased Interest 0 0 0  Down, Depressed, Hopeless 0 0 1  PHQ - 2 Score 0 0 1  Altered sleeping 0 0 0  Tired, decreased energy 2 2 2   Change in appetite 0 0 0  Feeling bad or failure about yourself  0 1 0  Trouble concentrating 0 0 0  Moving slowly or fidgety/restless 0 0 0  Suicidal thoughts 0 0 0  PHQ-9 Score 2 3 3   Difficult doing work/chores Not difficult at all Somewhat difficult Not difficult at all    Social History   Tobacco Use  . Smoking status: Never Smoker  . Smokeless tobacco: Never Used  Vaping Use  . Vaping Use: Never used  Substance Use Topics  . Alcohol use: Not Currently    Comment: past  . Drug use: Never    Review of Systems Per HPI unless specifically indicated above     Objective:    BP 110/65 (BP Location: Left Arm, Patient Position: Sitting, Cuff Size: Normal)   Pulse 74   Ht 5\' 8"  (1.727 m)   Wt 247 lb 9.6 oz (112.3 kg)   SpO2 94%   BMI 37.65 kg/m   Wt Readings from Last 3 Encounters:  04/16/20 247 lb 9.6 oz (112.3 kg)  12/18/19 247 lb (112 kg)  10/17/19 248 lb (112.5 kg)    Physical Exam Vitals and nursing note reviewed.  Constitutional:      General: He is not in acute distress.    Appearance: He is well-developed. He is obese.  He is not diaphoretic.     Comments: Well-appearing, comfortable, cooperative  HENT:     Head: Normocephalic and atraumatic.  Eyes:     General:        Right eye: No discharge.        Left eye: No discharge.     Conjunctiva/sclera: Conjunctivae normal.     Pupils: Pupils are equal, round, and reactive to light.  Neck:     Thyroid: No thyromegaly.  Cardiovascular:     Rate and Rhythm: Normal rate and regular rhythm.     Heart sounds: Normal heart sounds. No murmur heard.   Pulmonary:     Effort: Pulmonary effort is normal. No respiratory distress.     Breath sounds: Normal breath sounds. No wheezing or  rales.  Abdominal:     General: Bowel sounds are normal. There is no distension.     Palpations: Abdomen is soft. There is no mass.     Tenderness: There is no abdominal tenderness.  Musculoskeletal:        General: No tenderness. Normal range of motion.     Cervical back: Normal range of motion and neck supple.     Right lower leg: No edema.     Left lower leg: No edema.     Comments: Upper / Lower Extremities: - Normal muscle tone, strength bilateral upper extremities 5/5, lower extremities 5/5  Lymphadenopathy:     Cervical: No cervical adenopathy.  Skin:    General: Skin is warm and dry.     Findings: No erythema or rash.  Neurological:     Mental Status: He is alert and oriented to person, place, and time.     Comments: Distal sensation intact to light touch all extremities  Psychiatric:        Behavior: Behavior normal.     Comments: Well groomed, good eye contact, normal speech and thoughts       Results for orders placed or performed in visit on 03/18/20  T4, free  Result Value Ref Range   Free T4 1.5 0.8 - 1.8 ng/dL  TSH  Result Value Ref Range   TSH 1.84 0.40 - 4.50 mIU/L      Assessment & Plan:   Problem List Items Addressed This Visit    Systolic CHF (HCC)    Followed by Cardiology Today mostly euvolemic, some residual edema Continues on diuretic regimen per cardiology lasix 80 BID - advised that caution with missed doses if affecting dyspnea and wt gain should f/u with Cards      Recurrent major depressive disorder, in partial remission (HCC)    Stable Continue SSRI      Morbid obesity (HCC)   Hypothyroidism    Last lab normalized thyroid panel Continue on Levothyroxine daily      History of cerebrovascular accident (CVA) with residual deficit   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 Continue current BP regimen 2. Encourage improved lifestyle - low sodium diet, regular exercise 3. 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 montelukast (SINGULAIR) 10 MG tablet    Sig: Take 1 tablet (10 mg total) by mouth at bedtime.    Dispense:  90 tablet    Refill:  3  . DISCONTD: SHINGRIX injection    Sig: Inject 0.5mg  into muscle for first dose Shingles vaccine. Repeat dose in 2-6 month.    Dispense:  0.5 mL    Refill:  1  . SHINGRIX injection    Sig: Inject 0.64mL into muscle once for 1st dose Shingrix then repeat in 2-6 months for 2nd dose.    Dispense:  0.5 mL    Refill:  1      Follow up plan: Return in about 6 months (around 10/16/2020) for 6 month follow-up fasting lab only then 1 week later Annual Physical.  Future labs ordered for 10/2020   Saralyn Pilar, DO [facility] 04/16/2020, 11:12 AM

## 2020-04-16 NOTE — Assessment & Plan Note (Signed)
Stable. Continue SSRI.

## 2020-04-16 NOTE — Assessment & Plan Note (Signed)
Stable chronic problem, without new changes or concerns On DAPT with Plavix + ASA Off Eliquis 

## 2020-04-16 NOTE — Assessment & Plan Note (Signed)
Followed by Cardiology Today mostly euvolemic, some residual edema Continues on diuretic regimen per cardiology lasix 80 BID - advised that caution with missed doses if affecting dyspnea and wt gain should f/u with Cards

## 2020-04-19 LAB — CUP PACEART REMOTE DEVICE CHECK
Date Time Interrogation Session: 20220403012949
Implantable Pulse Generator Implant Date: 20200714

## 2020-04-21 ENCOUNTER — Ambulatory Visit (INDEPENDENT_AMBULATORY_CARE_PROVIDER_SITE_OTHER): Payer: Medicare PPO

## 2020-04-21 DIAGNOSIS — I63412 Cerebral infarction due to embolism of left middle cerebral artery: Secondary | ICD-10-CM | POA: Diagnosis not present

## 2020-05-05 NOTE — Progress Notes (Signed)
Carelink Summary Report / Loop Recorder 

## 2020-05-22 ENCOUNTER — Other Ambulatory Visit: Payer: Self-pay | Admitting: Family Medicine

## 2020-05-22 ENCOUNTER — Telehealth: Payer: Medicare PPO | Admitting: General Practice

## 2020-05-22 ENCOUNTER — Ambulatory Visit (INDEPENDENT_AMBULATORY_CARE_PROVIDER_SITE_OTHER): Payer: Medicare PPO | Admitting: General Practice

## 2020-05-22 DIAGNOSIS — I5022 Chronic systolic (congestive) heart failure: Secondary | ICD-10-CM

## 2020-05-22 DIAGNOSIS — I10 Essential (primary) hypertension: Secondary | ICD-10-CM

## 2020-05-22 DIAGNOSIS — I25810 Atherosclerosis of coronary artery bypass graft(s) without angina pectoris: Secondary | ICD-10-CM

## 2020-05-22 DIAGNOSIS — R296 Repeated falls: Secondary | ICD-10-CM

## 2020-05-22 DIAGNOSIS — E782 Mixed hyperlipidemia: Secondary | ICD-10-CM

## 2020-05-22 DIAGNOSIS — E039 Hypothyroidism, unspecified: Secondary | ICD-10-CM

## 2020-05-22 NOTE — Patient Instructions (Signed)
Visit Information  PATIENT GOALS: Goals Addressed            This Visit's Progress   . RNCM: Prevent Falls and Injury       Follow Up Date 07-24-2020   - add more outdoor lighting - always use handrails on the stairs - always wear low-heeled or flat shoes or slippers with nonskid soles - call the doctor if I am feeling too drowsy - install bathroom grab bars - keep a flashlight by the bed - keep my cell phone with me always - learn how to get back up if I fall - make an emergency alert plan in case I fall - pick up clutter from the floors - use a nonslip pad with throw rugs, or remove them completely - use a cane or walker - use a nightlight in the bathroom - wear my glasses and/or hearing aid    Why is this important?    Most falls happen when it is hard for you to walk safely. Your balance may be off because of an illness. You may have pain in your knees, hip or other joints.   You may be overly tired or taking medicines that make you sleepy. You may not be able to see or hear clearly.   Falls can lead to broken bones, bruises or other injuries.   There are things you can do to help prevent falling.     Notes: patient had a fall last week when leaving a restaurant . 05-22-2020: The patient states that he had "near falls" yesterday but he caught himself. He has had falls since March. Has a toe that is bothering him. Extensive education about falls prevention and safety.        Patient verbalizes understanding of instructions provided today and agrees to view in MyChart.   Telephone follow up appointment with care management team member scheduled for: 07-24-2020 at 1 pm  Alto Denver RN, MSN, CCM Community Care Coordinator [facility]  [facility] [place] Mobile: [phone]

## 2020-05-22 NOTE — Telephone Encounter (Signed)
Patient has appointment this afternoon. Both medications approved per protocol. TSH due within one month so providing one month supply of levothyroxine.  Requested Prescriptions  Pending Prescriptions Disp Refills  . furosemide (LASIX) 80 MG tablet [Pharmacy Med Name: FUROSEMIDE 80MG  TABLETS] 180 tablet 0    Sig: TAKE 1 TABLET(80 MG) BY MOUTH TWICE DAILY     Cardiovascular:  Diuretics - Loop Passed - 05/22/2020 12:08 PM      Passed - K in normal range and within 360 days    Potassium  Date Value Ref Range Status  10/12/2019 3.7 3.5 - 5.3 mmol/L Final         Passed - Ca in normal range and within 360 days    Calcium  Date Value Ref Range Status  10/12/2019 9.2 8.6 - 10.3 mg/dL Final   Calcium, Ion  Date Value Ref Range Status  07/23/2018 1.09 (L) 1.15 - 1.40 mmol/L Final         Passed - Na in normal range and within 360 days    Sodium  Date Value Ref Range Status  10/12/2019 142 135 - 146 mmol/L Final         Passed - Cr in normal range and within 360 days    Creat  Date Value Ref Range Status  10/12/2019 0.99 0.70 - 1.18 mg/dL Final    Comment:    For patients >53 years of age, the reference limit for Creatinine is approximately 13% higher for people identified as African-American. .          Passed - Last BP in normal range    BP Readings from Last 1 Encounters:  04/16/20 110/65         Passed - Valid encounter within last 6 months    Recent Outpatient Visits          1 month ago Benign essential HTN   [facility] [place], [place], [place]   7 months ago Annual physical exam   [facility], DO   1 year ago Anxiety   [facility], DO   1 year ago Benign essential HTN   [facility] [place], [place], [place]   1 year ago Hemiplegia of left nondominant side due to cerebrovascular disease [facility])   [facility]  [facility], DO             . levothyroxine (SYNTHROID) 125 MCG tablet [Pharmacy Med Name: LEVOTHYROXINE 0.125MG  (Netta [name]) TAB] 30 tablet 0    Sig: TAKE 1 TABLET(125 MCG) BY MOUTH DAILY BEFORE AND BREAKFAST     Endocrinology:  Hypothyroid Agents Failed - 05/22/2020 12:08 PM      Failed - TSH needs to be rechecked within 3 months after an abnormal result. Refill until TSH is due.      Passed - TSH in normal range and within 360 days    TSH  Date Value Ref Range Status  03/19/2020 1.84 0.40 - 4.50 mIU/L Final         Passed - Valid encounter within last 12 months    Recent Outpatient Visits          1 month ago Benign essential HTN   [facility] [place], [place], [place]   7 months ago Annual physical exam   [facility], DO   1 year ago Anxiety   [name] [name] [place]   1 year ago Benign essential HTN   [facility] [place], Netta [name], DO   1 year ago Hemiplegia of left nondominant side due to cerebrovascular disease [facility])   [facility] [place], Netta [name], DO

## 2020-05-22 NOTE — Telephone Encounter (Signed)
Patient requesting 90 day supply instead of #30. Patient meets protocol standards . No future visit scheduled at this time.

## 2020-05-22 NOTE — Chronic Care Management (AMB) (Signed)
Chronic Care Management   CCM RN Visit Note  05/22/2020 Name: Luke Mckee MRN: [id number] DOB: [birth year]  Subjective: Luke Mckee is a 80 y.o. year old male who is a primary care patient of Olin Hauser, DO. The care management team was consulted for assistance with disease management and care coordination needs.    Engaged with patient by telephone for follow up visit in response to provider referral for case management and/or care coordination services.   Consent to Services:  The patient was given information about Chronic Care Management services, agreed to services, and gave verbal consent prior to initiation of services.  Please see initial visit note for detailed documentation.   Patient agreed to services and verbal consent obtained.   Assessment: Review of patient past medical history, allergies, medications, health status, including review of consultants reports, laboratory and other test data, was performed as part of comprehensive evaluation and provision of chronic care management services.   SDOH (Social Determinants of Health) assessments and interventions performed:    CCM Care Plan  Allergies  Allergen Reactions  . Ace Inhibitors   . Cephalexin     Vomiting and diarrhea  . Crestor [Rosuvastatin Calcium] Other (See Comments)    myalgia  . Tape Rash    blistering    Outpatient Encounter Medications as of 05/22/2020  Medication Sig  . acetaminophen (TYLENOL) 650 MG CR tablet Take 1,300 mg by mouth at bedtime.   Marland Kitchen aspirin 81 MG chewable tablet Chew 81 mg by mouth daily.  Marland Kitchen atenolol (TENORMIN) 50 MG tablet TAKE 1 TABLET(50 MG) BY MOUTH AT BEDTIME  . atorvastatin (LIPITOR) 80 MG tablet Take 1 tablet (80 mg total) by mouth daily.  . cetirizine (ZYRTEC) 10 MG tablet Take 10 mg by mouth at bedtime.  . Cholecalciferol (VITAMIN D) 50 MCG (2000 UT) tablet Take 4,000 Units by mouth daily.  . clopidogrel (PLAVIX) 75 MG tablet TAKE 1 TABLET(75 MG) BY MOUTH  DAILY  . diclofenac sodium (VOLTAREN) 1 % GEL Apply 2 g topically 4 (four) times daily as needed for pain. Foot pain  . docusate sodium (COLACE) 100 MG capsule Take 200 mg by mouth daily.  . furosemide (LASIX) 80 MG tablet TAKE 1 TABLET(80 MG) BY MOUTH TWICE DAILY  . isosorbide mononitrate (IMDUR) 30 MG 24 hr tablet TAKE 1 TABLET(30 MG) BY MOUTH DAILY  . levothyroxine (SYNTHROID) 125 MCG tablet TAKE 1 TABLET(125 MCG) BY MOUTH DAILY BEFORE BREAKFAST  . loratadine (CLARITIN) 10 MG tablet Take 10 mg by mouth daily.  Marland Kitchen LORazepam (ATIVAN) 0.5 MG tablet Take 1 tablet (0.5 mg total) by mouth daily as needed for anxiety or sleep.  . metroNIDAZOLE (METROCREAM) 0.75 % cream Apply topically in the morning and at bedtime.  . montelukast (SINGULAIR) 10 MG tablet Take 1 tablet (10 mg total) by mouth at bedtime.  . Multiple Vitamin (MULTIVITAMIN) tablet Take 1 tablet by mouth daily.  . nitroGLYCERIN (NITROSTAT) 0.4 MG SL tablet Place 1 tablet (0.4 mg total) under the tongue every 5 (five) minutes as needed for chest pain.  Marland Kitchen PATADAY 0.1 % ophthalmic solution Place into both eyes as needed.   . senna (SENOKOT) 8.6 MG tablet Take 1 tablet by mouth daily.  . sertraline (ZOLOFT) 100 MG tablet Take 1.5 tablets (150 mg total) by mouth daily.  Marland Kitchen SHINGRIX injection Inject 0.78mL into muscle once for 1st dose Shingrix then repeat in 2-6 months for 2nd dose.  . spironolactone (ALDACTONE) 25 MG tablet TAKE 1/2 TABLET(12.5  MG) BY MOUTH DAILY   No facility-administered encounter medications on file as of 05/22/2020.    Patient Active Problem List   Diagnosis Date Noted  . Hallux limitus, acquired, right 11/12/2019  . Pre-diabetes 10/17/2019  . Recurrent major depressive disorder, in partial remission ([place]) 04/16/2019  . Morbid obesity ([place]) 04/16/2019  . Vasomotor rhinitis 10/25/2018  . Hearing loss 08/28/2018  . Osteoarthritis 08/28/2018  . Tinnitus 08/28/2018  . Hemiplegia of right dominant side due to  cerebrovascular disease ([place]) 07/31/2018  . Stroke ([place]) 07/24/2018  . TIA (transient ischemic attack) 07/23/2018  . Slurred speech 07/23/2018  . Right arm weakness 07/23/2018  . Hypokalemia 07/23/2018  . Anxiety 07/20/2018  . Atherosclerosis of native coronary artery of native heart with stable angina pectoris ([place]) 06/16/2018  . Hx of CABG 06/16/2018  . Mixed hyperlipidemia 06/16/2018  . Benign essential HTN 06/16/2018  . History of cerebrovascular accident (CVA) with residual deficit 06/07/2018  . Systolic CHF ([place]) 99/83/3825  . Hypertension 02/15/2018  . S/P total knee arthroplasty, right 02/15/2018  . Primary osteoarthritis of both knees 11/25/2017  . Abnormal nuclear stress test 06/30/2016  . Hallux limitus of right foot 10/22/2015  . Anesthesia complication 05/39/7673  . GERD (gastroesophageal reflux disease) 10/10/2015  . Hypothyroidism 10/10/2015  . OSA on CPAP 01/10/2007  . Diagnosis unknown 01/12/2003    Conditions to be addressed/monitored:CHF, HTN, HLD and High risk for falls- fall prevention and safety  Care Plan : RNCM: Heart Failure (Adult)  Updates made by Vanita Ingles since 05/22/2020 12:00 AM    Problem: RNCM: Symptom Exacerbation (Heart Failure)   Priority: Medium    Goal: RNCM: Symptom Exacerbation Prevented or Minimized   Priority: Medium  Note:   Current Barriers:  Marland Kitchen Knowledge deficits related to basic heart failure pathophysiology and self care management . Unable to independently manage HF . Lacks social connections . Does not contact provider office for questions/concerns . Lack of scale in home . Financial strain  Occupational hygienist):   Over the next 120 days, patient will weigh self daily and record  Over the next 120 days, patient will verbalize understanding of Heart Failure Action Plan and when to call doctor  Over the next 120 days, patient will take all Heart Failure mediations as prescribed Interventions:   . Collaboration with Olin Hauser, DO regarding development and update of comprehensive plan of care as evidenced by provider attestation and co-signature . Inter-disciplinary care team collaboration (see longitudinal plan of care) . Basic overview and discussion of pathophysiology of Heart Failure . Provided written and verbal education on low sodium diet.  05-22-2020: Verbalized compliance with heart healthy diet. . Reviewed Heart Failure Action Plan in depth and provided written copy. 05-22-2020: The patient is taking Lasix 80 mg BID. States he does not have swelling in his legs and feet today. He does still have the fungus on his toe nails. Denies any recent exacerbations of heart failure.  . Assessed for scales in home . Discussed importance of daily weight- 05-22-2020: The patient does not weight daily. States he knows when his weight fluctuates.  States his HF is well controlled at this time. Encouraged the patient to weigh daily.  . Reviewed role of diuretics in prevention of fluid overload. 05-22-2020: Review of taking medications as directed. The patient verbalized compliance today. Will continue to monitor.   Patient Goals/Self-Care Activities . Over the next 120 days, patient will:  - Take Heart Failure Medications as  prescribed - Weigh daily and record (notify MD with 3 lb weight gain over night or 5 lb in a week) - Follow CHF Action Plan - Adhere to low sodium diet - barriers to lifestyle changes reviewed and addressed - barriers to treatment reviewed and addressed - cognitive screening completed and reviewed - depression screen reviewed - health literacy screening completed or reviewed - rescue (action) plan reviewed - self-awareness of signs/symptoms of worsening disease encouraged  Follow Up Plan: Telephone follow up appointment with care management team member scheduled for:07-24-2020 at 1 pm   Care Plan : RNCM: Management of HTN  Updates made by Vanita Ingles  since 05/22/2020 12:00 AM    Problem: RNCM: Hypertension (Hypertension)   Priority: Medium    Long-Range Goal: RNCM: Hypertension Monitored   Priority: Medium  Note:   Objective:  . Last practice recorded BP readings:  . BP Readings from Last 3 Encounters: .  04/16/20 . 110/65 .  12/18/19 . 100/62 .  10/17/19 . 118/70 .    Marland Kitchen Most recent eGFR/CrCl: No results found for: EGFR  No components found for: CRCL Current Barriers:  Marland Kitchen Knowledge Deficits related to basic understanding of hypertension pathophysiology and self care management . Knowledge Deficits related to understanding of medications prescribed for management of hypertension . Limited Social Support . Unable to independently manage HTN . Does not contact provider office for questions/concerns Case Manager Clinical Goal(s):  Marland Kitchen Over the next 120 days, patient will verbalize understanding of plan for hypertension management . Over the next 120 days, patient will attend all scheduled medical appointments: 04-16-2020 at 11 am. 05-22-2020: Saw the provider in April, no new appointments coming up. Knows to call the office for changes.  . Over the next 120 days, patient will demonstrate improved adherence to prescribed treatment plan for hypertension as evidenced by taking all medications as prescribed, monitoring and recording blood pressure as directed, adhering to low sodium/DASH diet . Over the next 120 days, patient will demonstrate improved health management independence as evidenced by checking blood pressure as directed and notifying PCP if SBP>160 or DBP > 90, taking all medications as prescribe, and adhering to a low sodium diet as discussed. . Over the next 120 days, patient will verbalize basic understanding of hypertension disease process and self health management plan as evidenced by compliance with medications, heart healthy diet, and working with the CCM team to optimize health and well being  Interventions:  . Collaboration  with Olin Hauser, DO regarding development and update of comprehensive plan of care as evidenced by provider attestation and co-signature . Inter-disciplinary care team collaboration (see longitudinal plan of care) . Evaluation of current treatment plan related to hypertension self management and patient's adherence to plan as established by provider. 05-22-2020: Denies any episodes of low blood pressures. The patient says he is doing well. Blood pressures have been stable.  . Provided education to patient re: stroke prevention, s/s of heart attack and stroke, DASH diet, complications of uncontrolled blood pressure . Reviewed medications with patient and discussed importance of compliance. 05-22-2020: The patient states that he is compliant with medications.  . Discussed plans with patient for ongoing care management follow up and provided patient with direct contact information for care management team . Advised patient, providing education and rationale, to monitor blood pressure daily and record, calling PCP for findings outside established parameters.  . Reviewed scheduled/upcoming provider appointments including: no upcoming appointments.  Patient Goals/Self-Care Activities . Over the next  120 days, patient will:  - UNABLE to independently manage HTN Self administers medications as prescribed Attends all scheduled provider appointments Calls provider office for new concerns, questions, or BP outside discussed parameters Checks BP and records as discussed Follows a low sodium diet/DASH diet - blood pressure equipment and technique reviewed - blood pressure trends reviewed - depression screen reviewed - home or ambulatory blood pressure monitoring encouraged  Follow Up Plan: Telephone follow up appointment with care management team member scheduled for: 07-24-2020 at 1 pm   Care Plan : RNCM: Management of HLD  Updates made by Vanita Ingles since 05/22/2020 12:00 AM    Problem:  RNCM: HLD Health Promotion or Disease Self-Management (General Plan of Care)   Priority: Medium    Goal: RNCM;HLD Self-Management Plan Developed   Priority: Medium  Note:   Current Barriers:  . Poorly controlled hyperlipidemia, complicated by OSA,HTN, CAD, HLD, CVA . Current antihyperlipidemic regimen: Atorvastatin 80 mg QD . Most recent lipid panel:     Component Value Date/Time   CHOL 127 10/12/2019 0810   TRIG 123 10/12/2019 0810   HDL 41 10/12/2019 0810   CHOLHDL 3.1 10/12/2019 0810   VLDL 25 07/24/2018 0458   LDLCALC 66 10/12/2019 0810 .   Marland Kitchen ASCVD risk enhancing conditions: age >72, HTN, CHF . Unable to independently manage HLD . Lacks social connections . Does not contact provider office for questions/concerns  RN Care Manager Clinical Goal(s):  Marland Kitchen Over the next 120 days, patient will work with Consulting civil engineer, providers, and care team towards execution of optimized self-health management plan . Over the next 120 days, patient will verbalize understanding of plan for HLD . Over the next 120 days, patient will work with [facility] and pcp to address needs related to HLD . Over the next 120 days, patient will attend all scheduled medical appointments: no upcoming appointments with the pcp. Saw in April. Doing well. The patient knows to call for changes or needs  Interventions: . Collaboration with Olin Hauser, DO regarding development and update of comprehensive plan of care as evidenced by provider attestation and co-signature . Inter-disciplinary care team collaboration (see longitudinal plan of care) . Medication review performed; medication list updated in electronic medical record.  Bertram Savin care team collaboration (see longitudinal plan of care) . Referred to pharmacy team for assistance with HLD medication management . Evaluation of current treatment plan related to HLD and patient's adherence to plan as established by provider. 05-22-2020: The patient  is doing well and denies any new concerns with management of HLD . Advised patient to call the office for changes in condition or questions about chronic conditions  . Provided education to patient re: Heart Healthy Diet and managing weight.  05-22-2020: The patient denies any issues related to dietary restrictions or changes in weight. Says his appetite is good.  . Reviewed medications with patient and discussed compliance. 05-22-2020: Endorses compliance with current medications regimen  . Discussed plans with patient for ongoing care management follow up and provided patient with direct contact information for care management team . Reviewed scheduled/upcoming provider appointments including: no new appointments at this time. Will call for changes or needs.    Patient Goals/Self-Care Activities: . Over the next 120 days, patient will:   - call for medicine refill 2 or 3 days before it runs out - call if I am sick and can't take my medicine - keep a list of all the medicines I take; vitamins and  herbals too - learn to read medicine labels - use a pillbox to sort medicine - use an alarm clock or phone to remind me to take my medicine - change to whole grain breads, cereal, pasta - drink 6 to 8 glasses of water each day - eat 5 or 6 small meals each day - limit fast food meals to no more than 1 per week - prepare main meal at home 3 to 5 days each week - read food labels for fat, fiber, carbohydrates and portion size - be open to making changes - I can manage, know and watch for signs of a heart attack - if I have chest pain, call for help - learn about small changes that will make a big difference - learn my personal risk factors - barriers to meeting goals identified - choices provided - collaboration with team encouraged - decision-making supported - difficulty of making life-long changes acknowledged - health risks reviewed - problem-solving facilitated - questions answered -  readiness for change evaluated - reassurance provided - resources needed to meet goals identified - self-reflection promoted - self-reliance encouraged   Follow Up Plan: Telephone follow up appointment with care management team member scheduled for: 07-24-2020 at 1 pm     Care Plan : RNCM: Fall Risk (Adult)  Updates made by Vanita Ingles since 05/22/2020 12:00 AM    Problem: RNCM: Fall Risk   Priority: High    Long-Range Goal: RNCM: Absence of Fall and Fall-Related Injury   Priority: High  Note:   Current Barriers:  Marland Kitchen Knowledge Deficits related to fall precautions in patient with multiple chronic conditions . Decreased adherence to prescribed treatment for fall prevention . Lacks social connections . Does not contact provider office for questions/concerns . Does not call office and report new falls when they occur . Knowledge Deficits related to how to obtain handicap sticker to use with his car. 05-22-2020: Has handicap sticker for his care and states it is very helpful . Chronic Disease Management support and education needs related to falls prevention and safety in patient with frequent falls due to unsteady gain and impaired mobility . Lacks caregiver support.  Clinical Goal(s):  . patient will demonstrate improved adherence to prescribed treatment plan for decreasing falls as evidenced by patient reporting and review of EMR . patient will verbalize using fall risk reduction strategies discussed . patient will not experience additional falls . patient will verbalize understanding of plan for fall prevention and safety in the home . patient will work with pcp, [facility] staff to address needs related to getting paperwork for handicap sticker for the patients care- 05-22-2020: Completed and done.  . patient will attend all scheduled medical appointments: No upcoming appointments. Knows to call the office for changes Interventions:  . Collaboration with Olin Hauser, DO  regarding development and update of comprehensive plan of care as evidenced by provider attestation and co-signature . Inter-disciplinary care team collaboration (see longitudinal plan of care) . Provided written and verbal education re: Potential causes of falls and Fall prevention strategies . Reviewed medications and discussed potential side effects of medications such as dizziness and frequent urination. 05-22-2020: Denies any issues at this time with medications, dizziness. Does take Lasix that causes frequent urination. Review and education done.  . Assessed for s/s of orthostatic hypotension. 05-22-2020: The patient denies any issues with blood pressure drops . Assessed for falls since last encounter. 05-22-2020: The patient has had falls since March and had 2 "near  falls" yesterday. States that the reason for falls is one of his toes is growing into the other toe and it is causing him to loose his balance. The patient states that he needs to be seen by a podiatrist. Is considering going to [facility] for this care, but did ask for recommendations by Dr. Parks Ranger. Will collaborate with pcp for recommendations on podiatrist.  . Assessed patients knowledge of fall risk prevention secondary to previously provided education. 05-22-2020: Reviewed today wit the patient. Discussed the concern of increase risk of fractures or head injuries with frequent falls. The patient states he is trying to be safe. He does not use a cane or a walker. Encouraged the patient to consider using DME when ambutlating.  . Assessed working status of life alert bracelet and patient adherence . Provided patient information for fall alert systems . Evaluation of current treatment plan related to falls prevention and safety  and patient's adherence to plan as established by provider. 05-22-2020: Education and support given on fall prevention and safety concerns as the patient has an impaired toe that is causing him to have an unsteady gait.  Will collaborate with pcp for recommendations. Review of being safe in his environment.  . Advised patient to call the office for new falls, seek emergent help if needed for falls with injury and work with [facility] for fall prevention and safety  . Provided education to patient re: being safe and monitoring for potential high fall risk areas, using DME to help with falls, reporting falls.  . Reviewed scheduled/upcoming provider appointments including: No upcoming appointments noted. Knows to call for changes, encouraged patient to call to get an appointment with podiatrist. The patient does not wish to return to previous podiatrist.  . Discussed plans with patient for ongoing care management follow up and provided patient with direct contact information for care management team Self-Care Deficits:  Unable to independently manage falls as evidence of fall last week when leaving a restaurant. 05-22-2020: Has had reoccurrence of falls since last outreach in March.  Lacks social connections Does not contact provider office for questions/concerns Patient Goals:  - Utilize cane (assistive device) appropriately with all ambulation- states he has misplaced his cane but is actively looking for it. 05-22-2020: Review of use of DME when ambulating. The patient denies using a cane or a walker. Will consider.  - De-clutter walkways - Change positions slowly - Wear secure fitting shoes at all times with ambulation - Utilize home lighting for dim lit areas - Demonstrate self and pet awareness at all times - activities of daily living skills assessed - assistive or adaptive device use encouraged - barriers to physical activity or exercise addressed - barriers to physical activity or exercise identified - barriers to safety identified - cognition assessed - cognitive-stimulating activities promoted - fall prevention plan reviewed and updated - fear of falling, loss of independence and pain acknowledged -  medication list reviewed - modification of home and work environment promoted Follow Up Plan: Telephone follow up appointment with care management team member scheduled for: 07-24-2020 at 1 pm     Plan:Telephone follow up appointment with care management team member scheduled for:  07-24-2020 at 1 pm  [place], MSN, [place] Mobile: [phone]

## 2020-05-26 ENCOUNTER — Ambulatory Visit (INDEPENDENT_AMBULATORY_CARE_PROVIDER_SITE_OTHER): Payer: Medicare PPO

## 2020-05-26 DIAGNOSIS — H2512 Age-related nuclear cataract, left eye: Secondary | ICD-10-CM | POA: Diagnosis not present

## 2020-05-26 DIAGNOSIS — I63412 Cerebral infarction due to embolism of left middle cerebral artery: Secondary | ICD-10-CM

## 2020-05-26 DIAGNOSIS — H353110 Nonexudative age-related macular degeneration, right eye, stage unspecified: Secondary | ICD-10-CM | POA: Diagnosis not present

## 2020-05-27 LAB — CUP PACEART REMOTE DEVICE CHECK
Date Time Interrogation Session: 20220514230536
Implantable Pulse Generator Implant Date: 20200714

## 2020-05-28 ENCOUNTER — Ambulatory Visit: Payer: Medicare PPO | Admitting: Pharmacist

## 2020-05-28 DIAGNOSIS — I10 Essential (primary) hypertension: Secondary | ICD-10-CM

## 2020-05-28 DIAGNOSIS — I5022 Chronic systolic (congestive) heart failure: Secondary | ICD-10-CM

## 2020-05-28 DIAGNOSIS — E039 Hypothyroidism, unspecified: Secondary | ICD-10-CM | POA: Diagnosis not present

## 2020-05-28 DIAGNOSIS — E782 Mixed hyperlipidemia: Secondary | ICD-10-CM | POA: Diagnosis not present

## 2020-05-28 NOTE — Chronic Care Management (AMB) (Signed)
Chronic Care Management Pharmacy Note  05/28/2020 Name:  Luke Mckee MRN:  [id number] DOB:  23-Dec-1940  Subjective: Luke Mckee is an 80 y.o. year old male who is a primary patient of Olin Hauser, DO.  The CCM team was consulted for assistance with disease management and care coordination needs.    Engaged with patient by telephone for follow up visit in response to provider referral for pharmacy case management and/or care coordination services.   Consent to Services:  The patient was given information about Chronic Care Management services, agreed to services, and gave verbal consent prior to initiation of services.  Please see initial visit note for detailed documentation.   Patient Care Team: Olin Hauser, DO as PCP - General (Family Medicine) Kynslei Art, Virl Diamond, RPH-CPP as Pharmacist Vanita Ingles, RN as Case Manager (General Practice)  Recent office visits: Office Visit with PCP on 4/6  Hospital visits: None in previous 6 months  Objective:  Lab Results  Component Value Date   CREATININE 0.99 10/12/2019   CREATININE 0.81 08/03/2018   CREATININE 0.84 07/25/2018       Component Value Date/Time   CHOL 127 10/12/2019 0810   TRIG 123 10/12/2019 0810   HDL 41 10/12/2019 0810   CHOLHDL 3.1 10/12/2019 0810   VLDL 25 07/24/2018 0458   LDLCALC 66 10/12/2019 0810    Hepatic Function Latest Ref Rng & Units 10/12/2019 07/24/2018 07/23/2018  Total Protein 6.1 - 8.1 g/dL 6.9 6.6 7.3  Albumin 3.5 - 5.0 g/dL - 3.4(L) 3.8  AST 10 - 35 U/L $Remo'16 17 21  'XvVLr$ ALT 9 - 46 U/L $Remo'14 17 20  'SlRmC$ Alk Phosphatase 38 - 126 U/L - 47 54  Total Bilirubin 0.2 - 1.2 mg/dL 0.4 0.8 0.7    Lab Results  Component Value Date/Time   TSH 1.84 03/19/2020 08:33 AM   TSH 7.25 (H) 12/12/2019 10:04 AM   FREET4 1.5 03/19/2020 08:33 AM   FREET4 1.1 12/12/2019 10:04 AM    Clinical ASCVD: Yes  The ASCVD Risk score (Florence., et al., 2013) failed to calculate for the following  reasons:   The patient has a prior MI or stroke diagnosis     Social History   Tobacco Use  Smoking Status Never Smoker  Smokeless Tobacco Never Used   BP Readings from Last 3 Encounters:  04/16/20 110/65  12/18/19 100/62  10/17/19 118/70   Pulse Readings from Last 3 Encounters:  04/16/20 74  12/18/19 71  10/17/19 69   Wt Readings from Last 3 Encounters:  04/16/20 247 lb 9.6 oz (112.3 kg)  12/18/19 247 lb (112 kg)  10/17/19 248 lb (112.5 kg)    Assessment: Review of patient past medical history, allergies, medications, health status, including review of consultants reports, laboratory and other test data, was performed as part of comprehensive evaluation and provision of chronic care management services.   SDOH:  (Social Determinants of Health) assessments and interventions performed: none   CCM Care Plan  Allergies  Allergen Reactions  . Ace Inhibitors   . Cephalexin     Vomiting and diarrhea  . Crestor [Rosuvastatin Calcium] Other (See Comments)    myalgia  . Tape Rash    blistering    Medications Reviewed Today    Reviewed by Vella Raring, RPH-CPP (Pharmacist) on 05/28/20 at 1124  Med List Status: <None>  Medication Order Taking? Sig Documenting Provider Last Dose Status Informant  acetaminophen (TYLENOL) 650 MG CR tablet [id number]  Yes Take 1,300 mg by mouth at bedtime as needed. [provider] Taking Active Spouse/Significant Other  aspirin 81 MG chewable tablet [id number] Yes Chew 81 mg by mouth daily. [provider] Taking Active   atenolol (TENORMIN) 50 MG tablet [id number] Yes TAKE 1 TABLET(50 MG) BY MOUTH AT BEDTIME Olin Hauser, DO Taking Active   atorvastatin (LIPITOR) 80 MG tablet [id number] Yes Take 1 tablet (80 mg total) by mouth daily. Olin Hauser, DO Taking Active   Cholecalciferol (VITAMIN D) 50 MCG (2000 UT) tablet [id number] Yes Take 4,000 Units by mouth daily. [provider] Taking Active  Spouse/Significant Other  clopidogrel (PLAVIX) 75 MG tablet [id number] Yes TAKE 1 TABLET(75 MG) BY MOUTH DAILY Gollan, Kathlene November, MD Taking Active   diclofenac sodium (VOLTAREN) 1 % GEL [id number] Yes Apply 2 g topically 4 (four) times daily as needed for pain. Foot pain [provider] Taking Active Spouse/Significant Other  docusate sodium (COLACE) 100 MG capsule [id number] Yes Take 200 mg by mouth daily. [provider] Taking Active Spouse/Significant Other  furosemide (LASIX) 80 MG tablet [id number] Yes TAKE 1 TABLET(80 MG) BY MOUTH TWICE DAILY Karamalegos, Devonne Doughty, DO Taking Active   isosorbide mononitrate (IMDUR) 30 MG 24 hr tablet [id number] Yes TAKE 1 TABLET(30 MG) BY MOUTH DAILY Olin Hauser, DO Taking Active   levothyroxine (SYNTHROID) 125 MCG tablet [id number] Yes TAKE 1 TABLET(125 MCG) BY MOUTH DAILY BEFORE BREAKFAST Karamalegos, Devonne Doughty, DO Taking Active   loratadine (CLARITIN) 10 MG tablet [id number] Yes Take 10 mg by mouth daily. [provider] Taking Active   LORazepam (ATIVAN) 0.5 MG tablet [id number] No Take 1 tablet (0.5 mg total) by mouth daily as needed for anxiety or sleep.  Patient not taking: Reported on 05/28/2020   Olin Hauser, DO Not Taking Active Spouse/Significant Other  metroNIDAZOLE (METROCREAM) 0.75 % cream [id number] Yes Apply topically in the morning and at bedtime. Ralene Bathe, MD Taking Active   montelukast (SINGULAIR) 10 MG tablet [id number] Yes Take 1 tablet (10 mg total) by mouth at bedtime. Olin Hauser, DO Taking Active   Multiple Vitamin (MULTIVITAMIN) tablet [id number] Yes Take 1 tablet by mouth daily. [provider] Taking Active Spouse/Significant Other  nitroGLYCERIN (NITROSTAT) 0.4 MG SL tablet [id number] No Place 1 tablet (0.4 mg total) under the tongue every 5 (five) minutes as needed for chest pain.  Patient not taking: Reported on 05/28/2020   Olin Hauser, DO Not  Taking Active   PATADAY 0.1 % ophthalmic solution [id number] Yes Place into both eyes as needed.  [provider] Taking Active   senna (SENOKOT) 8.6 MG tablet [id number] Yes Take 1 tablet by mouth daily. [provider] Taking Active   sertraline (ZOLOFT) 100 MG tablet [id number] Yes Take 1.5 tablets (150 mg total) by mouth daily. Olin Hauser, DO Taking Active   [facility] injection [id number] Yes Inject 0.76mL into muscle once for 1st dose Shingrix then repeat in 2-6 months for 2nd dose. Olin Hauser, DO Taking Active   spironolactone (ALDACTONE) 25 MG tablet [id number] Yes TAKE 1/2 TABLET(12.5 MG) BY MOUTH DAILY Olin Hauser, DO Taking Active           Patient Active Problem List   Diagnosis Date Noted  . Hallux limitus, acquired, right 11/12/2019  . Pre-diabetes 10/17/2019  . Recurrent major depressive disorder, in partial remission ([place]) 04/16/2019  . Morbid obesity ([place]) 04/16/2019  . Vasomotor rhinitis 10/25/2018  . Hearing loss  08/28/2018  . Osteoarthritis 08/28/2018  . Tinnitus 08/28/2018  . 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 ([place]) 07/31/2018  . Stroke ([place]) 07/24/2018  . TIA (transient ischemic attack) 07/23/2018  . Slurred speech 07/23/2018  . Right arm weakness 07/23/2018  . Hypokalemia 07/23/2018  . Anxiety 07/20/2018  . Atherosclerosis of native coronary artery of native heart with stable angina pectoris ([place]) 06/16/2018  . Hx of CABG 06/16/2018  . Mixed hyperlipidemia 06/16/2018  . Benign essential HTN 06/16/2018  . History of cerebrovascular accident (CVA) with residual deficit 06/07/2018  . Systolic CHF ([place]) 37/04/8887  . Hypertension 02/15/2018  . S/P total knee arthroplasty, right 02/15/2018  . Primary osteoarthritis of both knees 11/25/2017  . Abnormal nuclear stress test 06/30/2016  . Hallux limitus of right foot 10/22/2015  . Anesthesia complication 16/94/5038  . GERD  (gastroesophageal reflux disease) 10/10/2015  . Hypothyroidism 10/10/2015  . OSA on CPAP 01/10/2007  . Diagnosis unknown 01/12/2003    Immunization History  Administered Date(s) Administered  . Hepatitis A, Adult 09/23/1996, 03/16/1997  . Influenza Split 10/11/2016  . Influenza Whole 10/27/1996, 11/02/1997, 12/24/1998, 10/31/1999, 11/13/2000  . Influenza, High Dose Seasonal PF 09/05/2018  . Influenza-Unspecified 09/25/2019  . Moderna Sars-Covid-2 Vaccination 02/23/2019, 03/24/2019, 11/14/2019  . OPV 02/11/1985  . PPD Test 10/31/1999, 11/13/2000  . Pneumococcal Conjugate-13 11/08/2014  . Pneumococcal Polysaccharide-23 11/23/2000, 03/01/2008  . Td 09/12/1994, 03/01/2008  . Typhoid Parenteral 09/23/1996  . Typhoid Parenteral, AKD ([place] [employer]) 11/11/1993  . Yellow Fever 01/12/1995  . Zoster Recombinat (Shingrix) 06/25/2009    Conditions to be addressed/monitored: HTN, HLD, hypothyroidism  Care Plan : PharmD - Med Management  Updates made by Vella Raring, RPH-CPP since 05/28/2020 12:00 AM    Problem: Disease Progression     Long-Range Goal: Disease Progression Prevented or Minimized   Start Date: 02/04/2020  Expected End Date: 05/04/2020  This Visit's Progress: On track  Recent Progress: On track  Priority: High  Note:   Current Barriers:  . Chronic Disease Management support, education, and care coordination needs related to CAD, HTN, HLD, and hx CVA  Pharmacist Clinical Goal(s):  Marland Kitchen Over the next 90 days, patient will adhere to plan to optimize therapeutic regimen for hypothyroidism as evidenced by report of adherence to recommended medication management changes through collaboration with PharmD and provider.  .   Interventions: . 1:1 collaboration with Olin Hauser, DO regarding development and update of comprehensive plan of care as evidenced by provider attestation and co-signature . Inter-disciplinary care team collaboration (see longitudinal plan of  care) . Perform chart review. Patient seen for Office Visit with PCP on 4/6. Recommended patient: o Receive COVID-19 second booster dose when ready o Receive Shingrix vaccination when ready o Start montelukast nightly for allergies. Stop cetirizine/Zyrtec, but can continue loratadine/Claritin  . Today speak with patient and wife who reports patient planning have surgery for left eye cataract in future. Confirms will follow up with eye surgery team regarding whether any medications need to be held for this procedure and, if so, follow up with providers . Reports received first dose of Shingrix from [facility] on 4/27 and scheduled to receive second dose . Comprehensive medication review performed; medication list updated in electronic medical record  Seasonal allergies: . Current treatment: o Loratadine 10 mg daily o Montelukast 10 mg QHS . Confirms stopped taking cetirizine/Zyrtec as recommended by PCP . Reports allergy symptoms improved, particularly runny nose   Hypothyroidism . Controled; current treatment: levothyroxine 125 mcg  daily before breakfast . Reports patient continues to levothyroxine consistently in the morning on an empty stomach, at least 30 minutes before food and at least 4 hours from calcium- or iron-containing products using new weekly pill organizer  Hypertension  Controlled; current treatment:  Atenolol 50 mg daily at bedtime  Furosemide 80 mg twice daily  Isosorbide ER 30 mg once daily  Spironolactone 25 mg - 1/2 tablet (12.5 mg) once daily  Reports recent home BP readings ranging: 118-132/70s  Encourage to continue to monitor home BP 1-2 times/week, keep log of results and call providers for readings outside of established parameters.   Patient Goals/Self-Care Activities . Over the next 90 days, patient will:  - take medications as prescribed with assistance of wife   Note: patient uses weekly pillbox as filled by wife - check blood pressure,  document, and provide at future appointments  Follow Up Plan: Telephone follow up appointment with care management team member scheduled for: 8/17 at 11:15 am      [name] preferred pharmacy is:  [facility] [place] [address] [place] [address] Phone: [phone] Fax: [phone]  [facility] [address], [place] [address] Phone: [phone] Fax: [phone]  Uses pill box? Yes  Follow Up:  Patient agrees to Care Plan and Follow-up.  Harlow Asa, PharmD, [place], CPP Clinical Pharmacist [facility] [phone]

## 2020-05-28 NOTE — Patient Instructions (Signed)
Visit Information  PATIENT GOALS: Goals Addressed            This Visit's Progress   . Pharmacy Goals       It was great talking with you today!  Please check your blood pressure 1-2 times/week at home and keep a record to bring to medical appointments  Our goal bad cholesterol, or LDL, is less than 70 . This is why it is important to continue taking your atorvastatin  Feel free to call me with any questions or concerns. I look forward to our next call!  Duanne Moron, PharmD, BCACP Clinical Pharmacist [facility] [phone]         The patient verbalized understanding of instructions, educational materials, and care plan provided today and declined offer to receive copy of patient instructions, educational materials, and care plan.   Telephone follow up appointment with care management team member scheduled for: 8/17 at 11:15 am

## 2020-05-31 ENCOUNTER — Other Ambulatory Visit: Payer: Self-pay | Admitting: Family Medicine

## 2020-05-31 DIAGNOSIS — E039 Hypothyroidism, unspecified: Secondary | ICD-10-CM

## 2020-06-18 NOTE — Progress Notes (Signed)
Carelink Summary Report / Loop Recorder 

## 2020-06-24 IMAGING — CT CT HEAD CODE STROKE W/O CM
4 series · 16 of 47 positions shown, 18 images · non-contrast
Comparison: None.

CLINICAL DATA: Code stroke. Encephalopathy. Right-sided facial
droop and slurred speech. Last seen normal 5 p.m.

EXAM:
CT HEAD WITHOUT CONTRAST
TECHNIQUE: Contiguous axial images were obtained from the base of the skull
through the vertex without intravenous contrast.

[Series 3: head wo · axial · 0.44mm/px · z∈[-208,-83]mm · 7 of 35 slices shown, 9 images]
[im 5/35  brain]
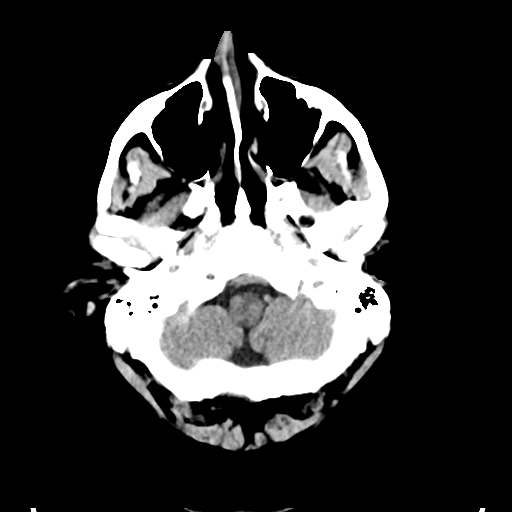
[im 5/35  bone]
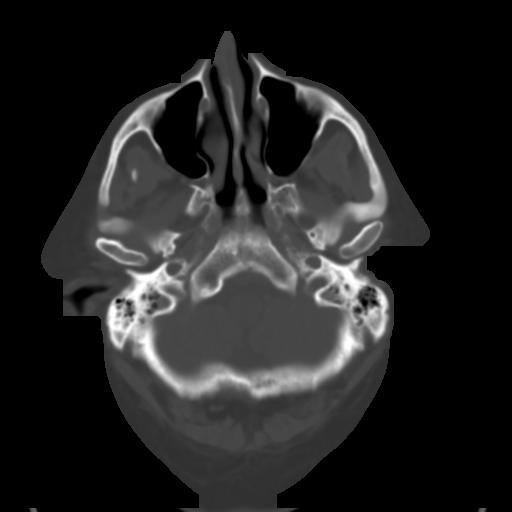
[im 9/35  brain]
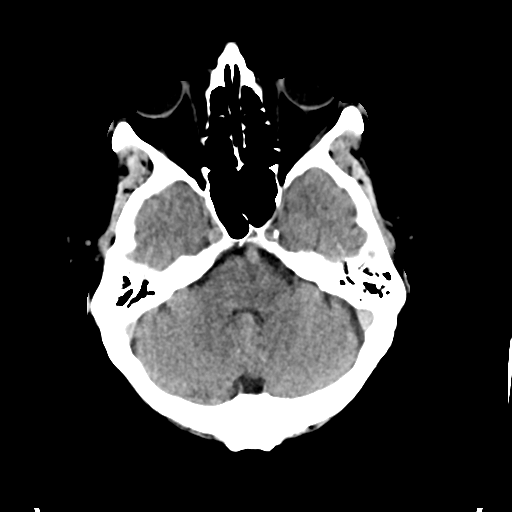
[im 13/35  brain]
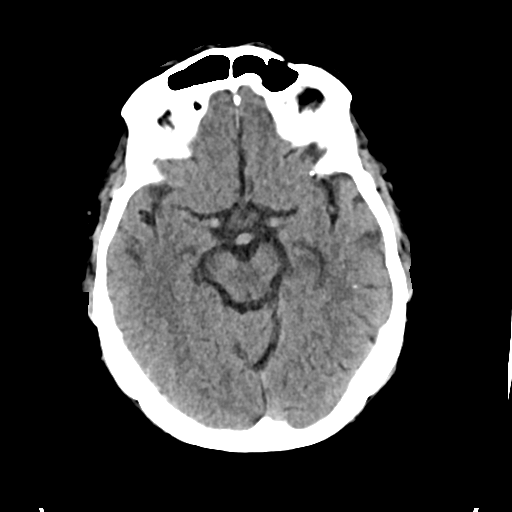
[im 18/35  brain]
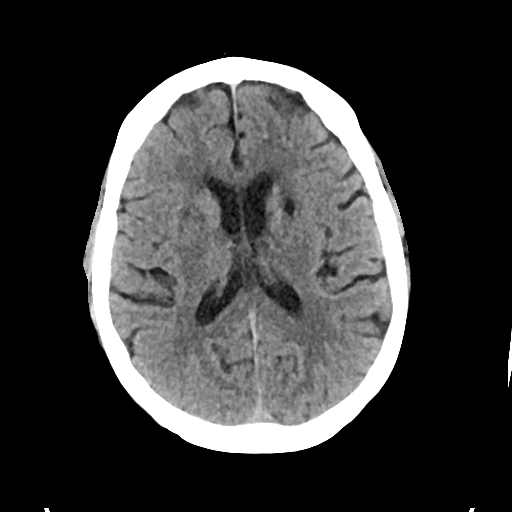
[im 22/35  brain]
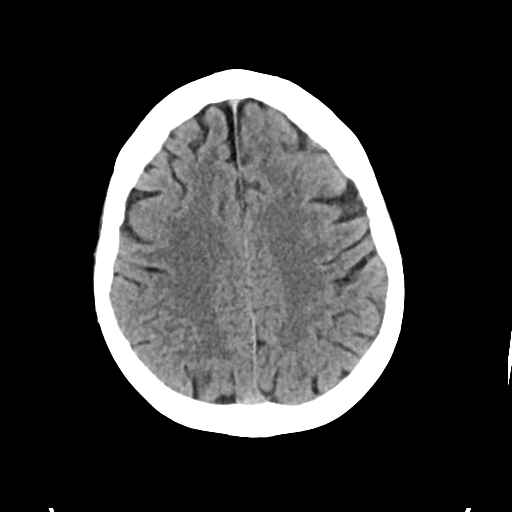
[im 22/35  bone]
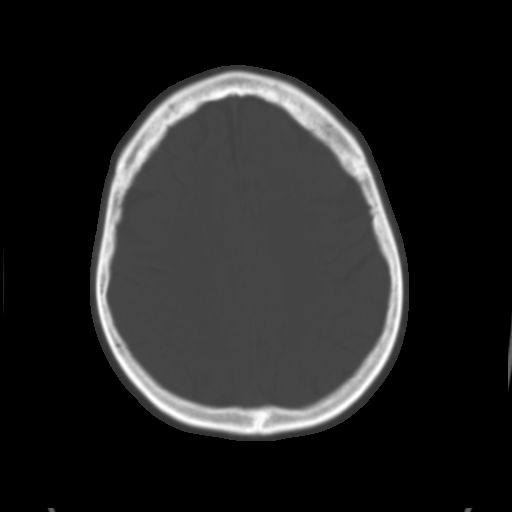
[im 26/35  brain]
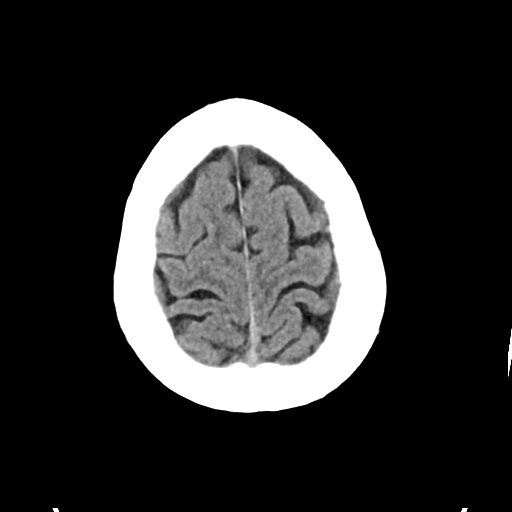
[im 30/35  brain]
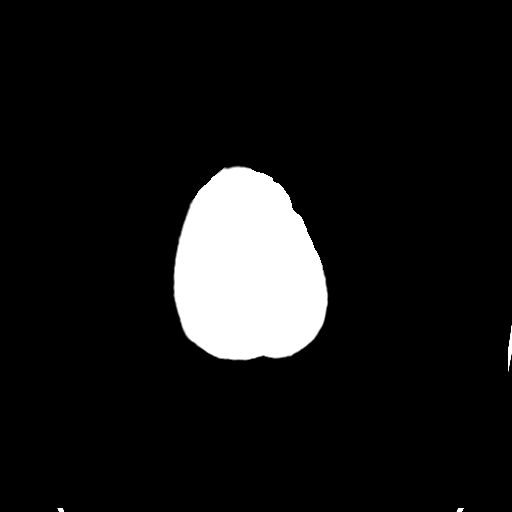

[Series 4: head bone · axial · 0.44mm/px · z∈[-212,-178]mm · 3 of 86 slices shown]
[im 9/86  bone]
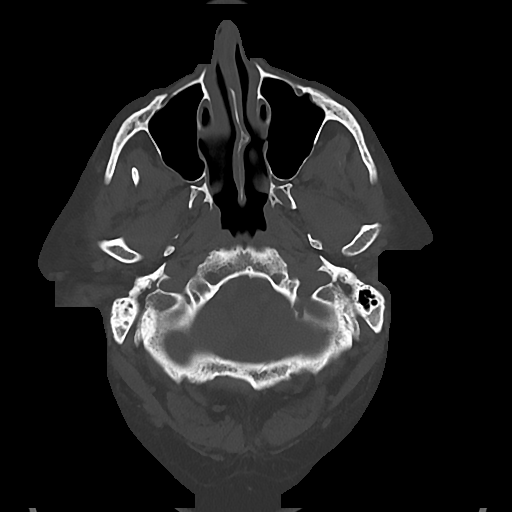
[im 18/86  bone]
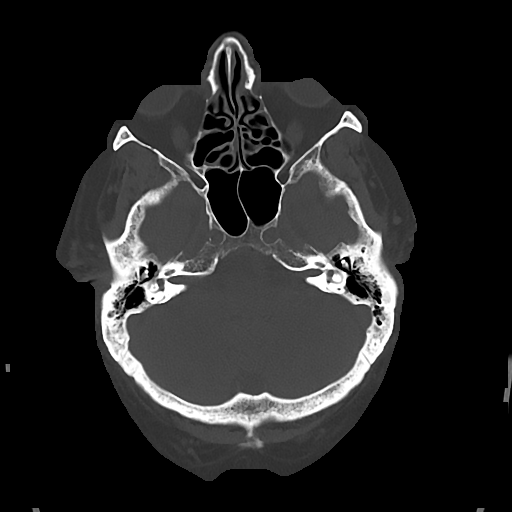
[im 26/86  bone]
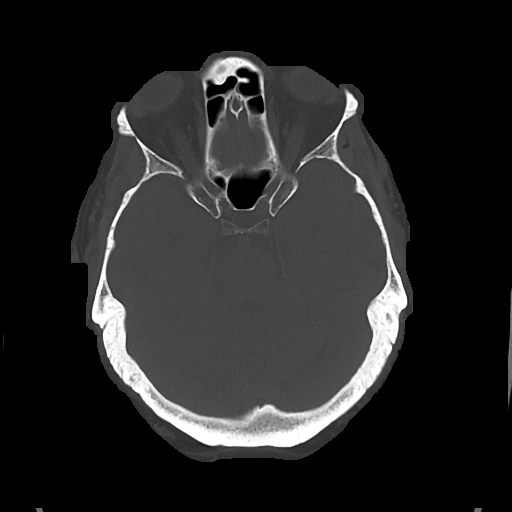

[Series 5: cor soft · coronal · 0.33mm/px · 3 of 71 slices shown]
[im 24/71  brain]
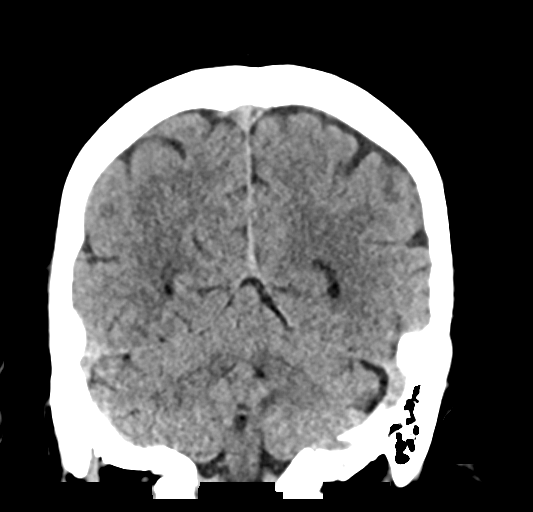
[im 32/71  brain]
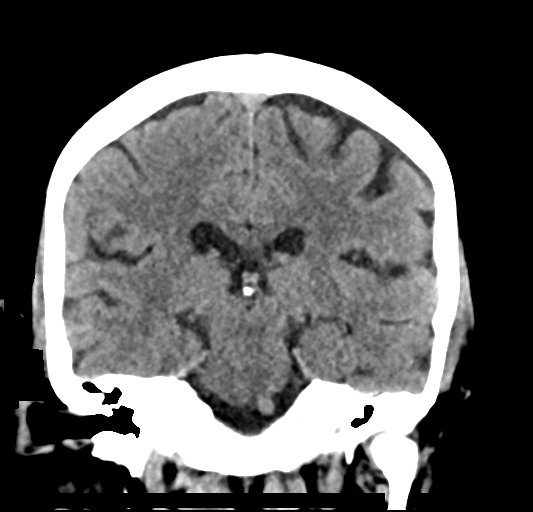
[im 39/71  brain]
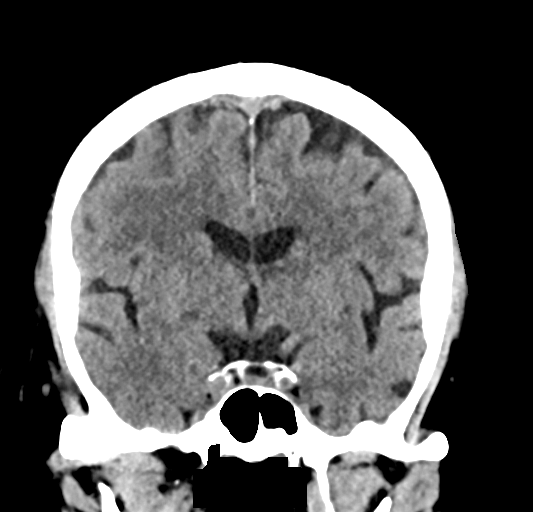

[Series 6: sag soft · sagittal · 0.33mm/px · 3 of 67 slices shown]
[im 23/67  brain]
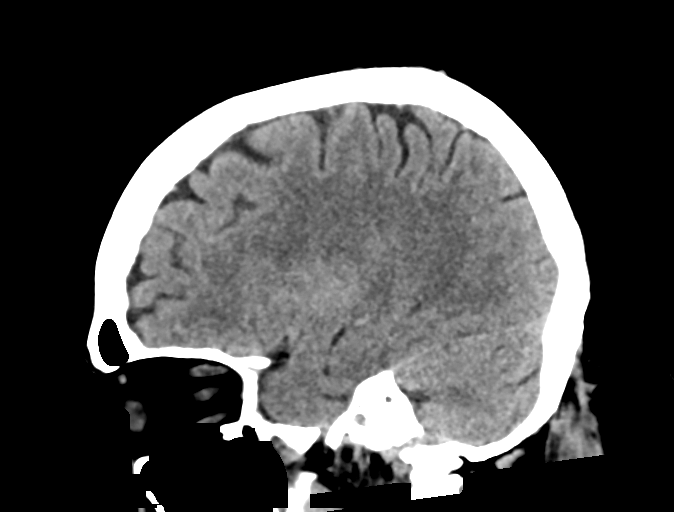
[im 34/67  brain]
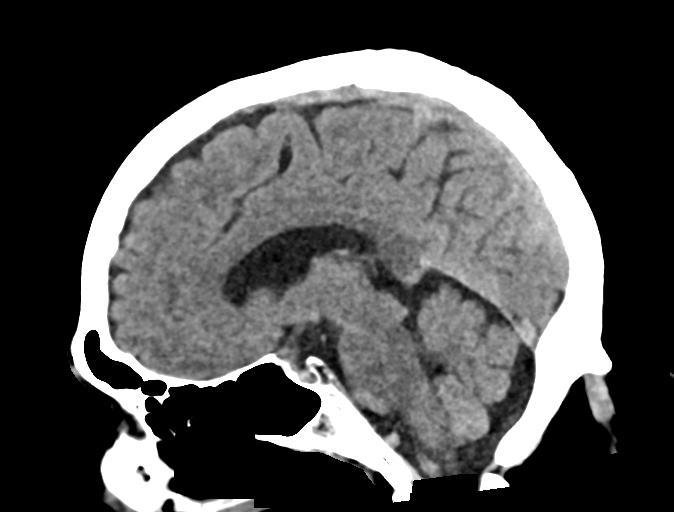
[im 45/67  brain]
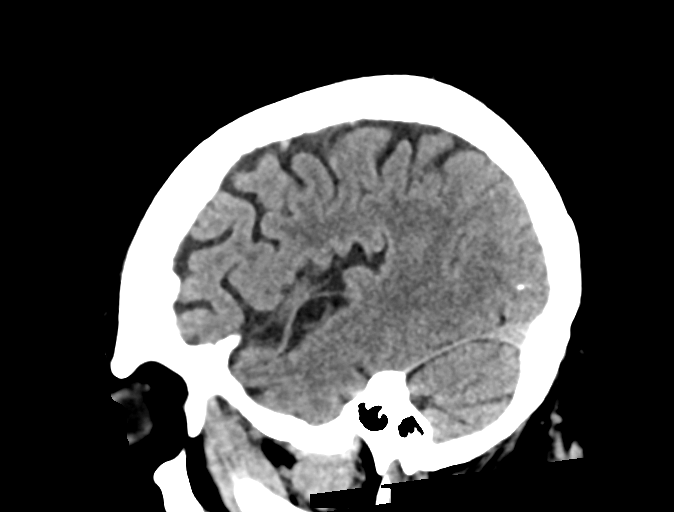

[16 of 47 positions shown; findings below may reference images not displayed]

FINDINGS: Brain: There is no mass, hemorrhage or extra-axial collection. The
size and configuration of the ventricles and extra-axial CSF spaces
are normal. Areas of hypoattenuation of the deep gray nuclei and
confluent periventricular white matter hypodensity, consistent with
chronic small vessel disease. Old infarcts of the left basal ganglia
and thalamus.

Vascular: No abnormal hyperdensity of the major intracranial
arteries or dural venous sinuses. No intracranial atherosclerosis.

Skull: The visualized skull base, calvarium and extracranial soft
tissues are normal.

Sinuses/Orbits: No fluid levels or advanced mucosal thickening of
the visualized paranasal sinuses. No mastoid or middle ear effusion.
The orbits are normal.

ASPECTS (Alberta Stroke Program Early CT Score)

- Ganglionic level infarction (caudate, lentiform nuclei, internal
capsule, insula, M1-M3 cortex): 7

- Supraganglionic infarction (M4-M6 cortex): 3

Total score (0-10 with 10 being normal): 10
IMPRESSION: 1. No intracranial hemorrhage.
2. ASPECTS is 10.

These results were communicated to Dr. Dleke Tiger at [DATE] on 07/23/2018 by text page via the AMION messaging system.

## 2020-06-24 IMAGING — CT CT ANGIOGRAPHY NECK
2 of 7 series · 8 of 33 positions shown · IV contrast (APPLIED)
Comparison: 07/23/2018 CT head.

CLINICAL DATA: 78 y/o M; right facial droop and slurred speech,
stroke follow-up.

EXAM:
CT ANGIOGRAPHY HEAD AND NECK
TECHNIQUE: Multidetector CT imaging of the head and neck was performed using
the standard protocol during bolus administration of intravenous
contrast. Multiplanar CT image reconstructions and MIPs were
obtained to evaluate the vascular anatomy. Carotid stenosis
measurements (when applicable) are obtained utilizing NASCET
criteria, using the distal internal carotid diameter as the
denominator.
CONTRAST:  75 cc Omnipaque 350

[Series 5: cta neck/head · axial · 0.54mm/px · z∈[-315,-191]mm · 2 of 187 slices shown]
[im 63/187  soft-tissue]
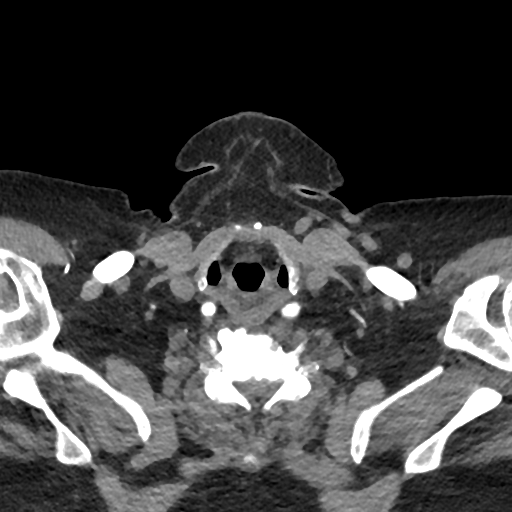
[im 125/187  soft-tissue]
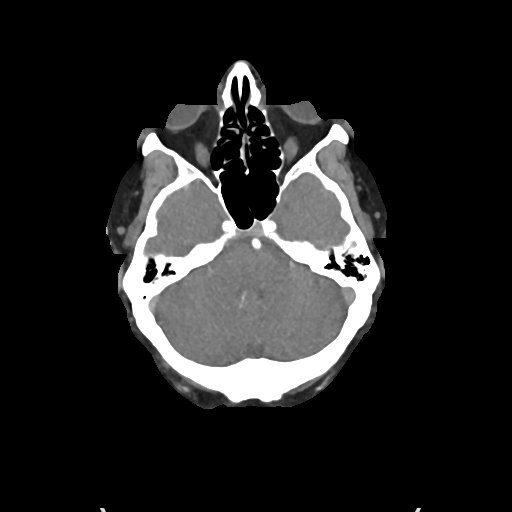

[Series 7: ax thins · axial · 0.39mm/px · z∈[-386,-116]mm · 6 of 378 slices shown]
[im 54/378  soft-tissue]
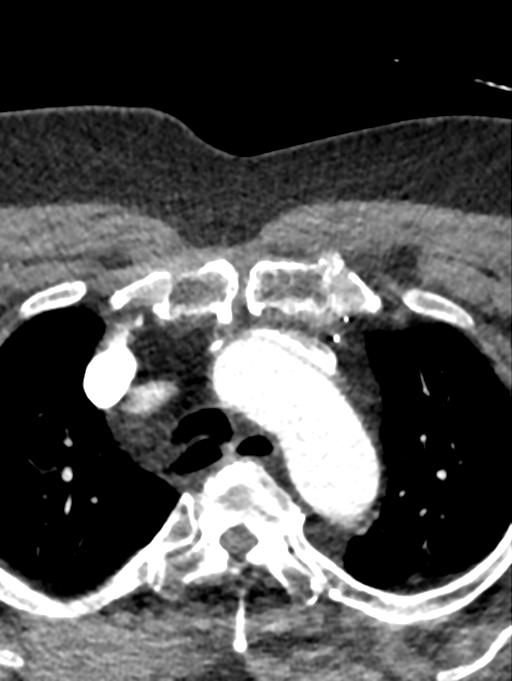
[im 108/378  bone]
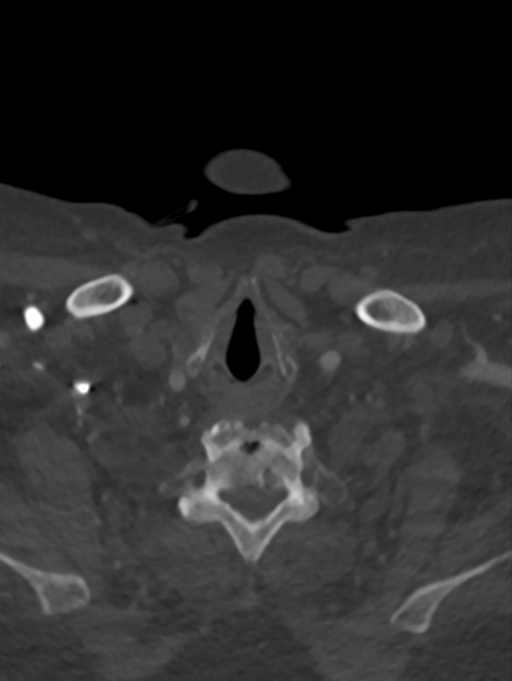
[im 162/378  soft-tissue]
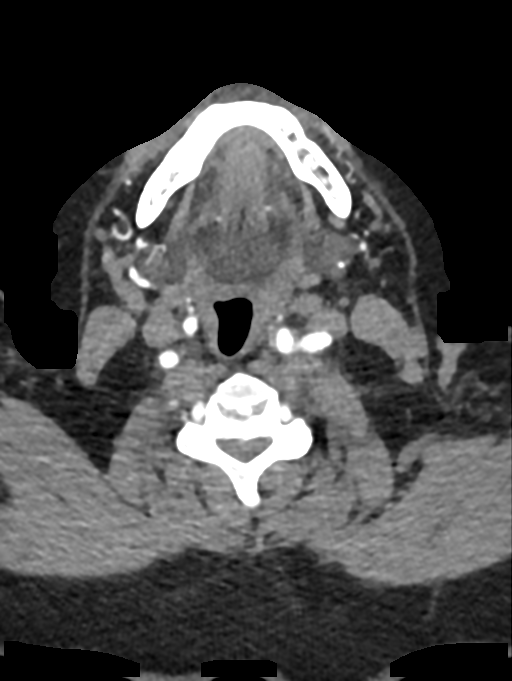
[im 216/378  bone]
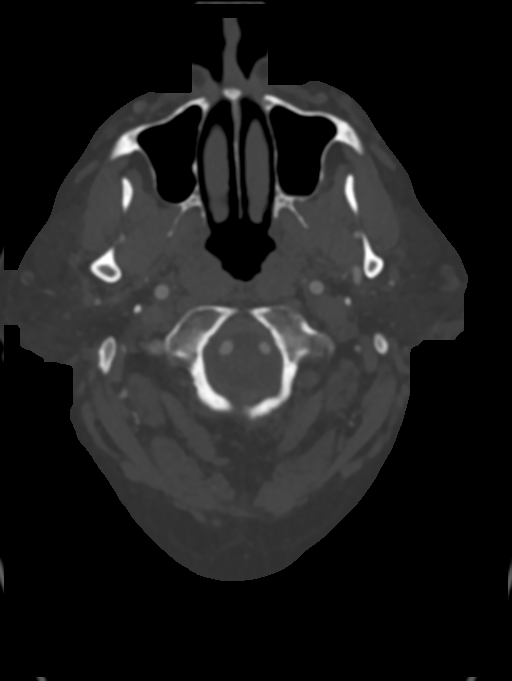
[im 270/378  soft-tissue]
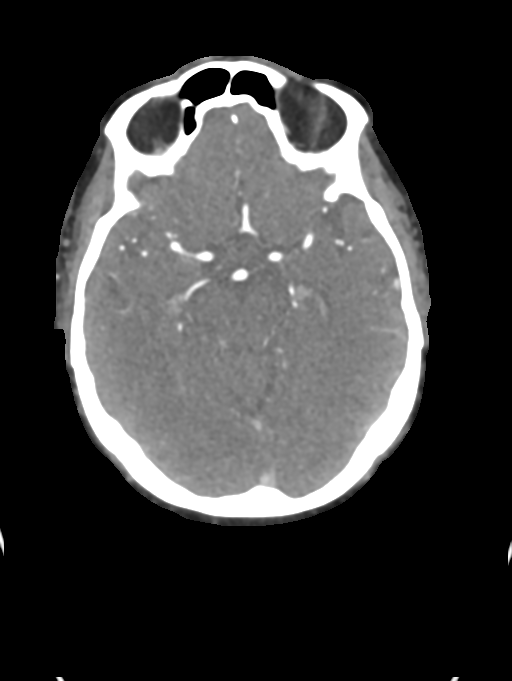
[im 324/378  bone]
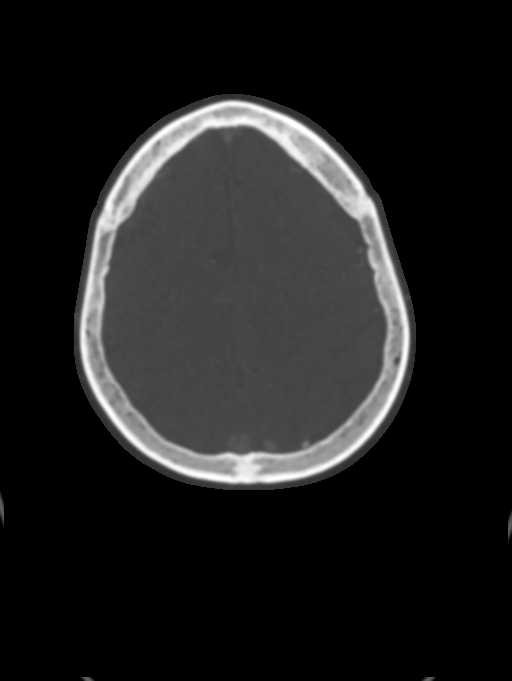

[8 of 33 positions shown; findings below may reference images not displayed]

FINDINGS: CTA NECK FINDINGS

Aortic arch: Standard branching. Imaged portion shows no evidence of
aneurysm or dissection. No significant stenosis of the major arch
vessel origins. CABG with [REDACTED] and saphenous grafts. Mixed plaque of
aortic arch.

Right carotid system: No evidence of dissection, stenosis (50% or
greater) or occlusion. Non stenotic calcific atherosclerosis of
carotid bifurcation.

Left carotid system: No evidence of dissection, stenosis (50% or
greater) or occlusion. Non stenotic calcific atherosclerosis of
carotid bifurcation.

Vertebral arteries: Codominant. No evidence of dissection, stenosis
(50% or greater) or occlusion.

Skeleton: Cervical spondylosis with grade 1 anterolisthesis at C2-3
and C3-4, reversal of cervical lordosis, and advanced discogenic
degenerative changes at C4-C7. Left-greater-than-right facet
arthropathy. Multifactorial at least moderate spinal canal stenosis
at C4-C7. Multilevel neural foraminal stenosis.

Other neck: Negative.

Upper chest: Partially visualized fatty mass within the right
paramedian anterior chest wall spanning up to 12 cm, probably
lipoma.

Review of the MIP images confirms the above findings

CTA HEAD FINDINGS

Anterior circulation: No significant stenosis, proximal occlusion,
aneurysm, or vascular malformation. Non stenotic calcific
atherosclerosis of internal carotid arteries.

Posterior circulation: No significant stenosis, proximal occlusion,
aneurysm, or vascular malformation.

Venous sinuses: As permitted by contrast timing, patent.

Anatomic variants: None significant.

Review of the MIP images confirms the above findings
IMPRESSION: 1. Patent carotid and vertebral arteries. No dissection, aneurysm,
or hemodynamically significant stenosis utilizing NASCET criteria.
2. Patent anterior and posterior intracranial circulation. No large
vessel occlusion, aneurysm, or significant stenosis.
3. Non stenotic calcific atherosclerosis of the aorta and carotid
systems.
4. Advanced spondylosis of the cervical spine with at least moderate
spinal canal stenosis from C4-C7.

## 2020-06-30 ENCOUNTER — Other Ambulatory Visit: Payer: Self-pay | Admitting: Family Medicine

## 2020-06-30 ENCOUNTER — Ambulatory Visit (INDEPENDENT_AMBULATORY_CARE_PROVIDER_SITE_OTHER): Payer: Medicare PPO

## 2020-06-30 DIAGNOSIS — I10 Essential (primary) hypertension: Secondary | ICD-10-CM

## 2020-06-30 DIAGNOSIS — F419 Anxiety disorder, unspecified: Secondary | ICD-10-CM

## 2020-06-30 DIAGNOSIS — I63412 Cerebral infarction due to embolism of left middle cerebral artery: Secondary | ICD-10-CM

## 2020-06-30 DIAGNOSIS — I25810 Atherosclerosis of coronary artery bypass graft(s) without angina pectoris: Secondary | ICD-10-CM

## 2020-06-30 DIAGNOSIS — R21 Rash and other nonspecific skin eruption: Secondary | ICD-10-CM

## 2020-06-30 DIAGNOSIS — E039 Hypothyroidism, unspecified: Secondary | ICD-10-CM

## 2020-06-30 DIAGNOSIS — E782 Mixed hyperlipidemia: Secondary | ICD-10-CM

## 2020-06-30 MED ORDER — ATORVASTATIN CALCIUM 80 MG PO TABS: 80.0000 mg | ORAL_TABLET | Freq: Every day | ORAL | 3 refills | Status: DC

## 2020-06-30 MED ORDER — CLOTRIMAZOLE-BETAMETHASONE 1-0.05 % EX CREA
TOPICAL_CREAM | CUTANEOUS | 1 refills | Status: AC
Start: 2020-06-30 — End: ?

## 2020-06-30 MED ORDER — ATENOLOL 50 MG PO TABS: 50.0000 mg | ORAL_TABLET | Freq: Every day | ORAL | 3 refills | Status: DC

## 2020-06-30 MED ORDER — LEVOTHYROXINE SODIUM 125 MCG PO TABS
125.0000 ug | ORAL_TABLET | Freq: Every day | ORAL | 3 refills | Status: DC
Start: 2020-06-30 — End: 2021-07-03

## 2020-06-30 MED ORDER — ISOSORBIDE MONONITRATE ER 30 MG PO TB24: 30.0000 mg | ORAL_TABLET | Freq: Every day | ORAL | 3 refills | Status: DC

## 2020-06-30 MED ORDER — SERTRALINE HCL 100 MG PO TABS: 150.0000 mg | ORAL_TABLET | Freq: Every day | ORAL | 3 refills | Status: DC

## 2020-06-30 MED ORDER — FUROSEMIDE 80 MG PO TABS: 80.0000 mg | ORAL_TABLET | Freq: Two times a day (BID) | ORAL | 3 refills | Status: DC

## 2020-07-01 LAB — CUP PACEART REMOTE DEVICE CHECK
Date Time Interrogation Session: 20220616231658
Implantable Pulse Generator Implant Date: 20200714

## 2020-07-03 DIAGNOSIS — G4733 Obstructive sleep apnea (adult) (pediatric): Secondary | ICD-10-CM | POA: Diagnosis not present

## 2020-07-18 NOTE — Progress Notes (Signed)
Carelink Summary Report / Loop Recorder 

## 2020-07-24 ENCOUNTER — Telehealth: Payer: Medicare PPO

## 2020-07-28 ENCOUNTER — Telehealth: Payer: Medicare PPO | Admitting: General Practice

## 2020-07-28 ENCOUNTER — Ambulatory Visit (INDEPENDENT_AMBULATORY_CARE_PROVIDER_SITE_OTHER): Payer: Medicare PPO | Admitting: General Practice

## 2020-07-28 DIAGNOSIS — I10 Essential (primary) hypertension: Secondary | ICD-10-CM

## 2020-07-28 DIAGNOSIS — I5022 Chronic systolic (congestive) heart failure: Secondary | ICD-10-CM | POA: Diagnosis not present

## 2020-07-28 DIAGNOSIS — E782 Mixed hyperlipidemia: Secondary | ICD-10-CM | POA: Diagnosis not present

## 2020-07-28 DIAGNOSIS — R296 Repeated falls: Secondary | ICD-10-CM

## 2020-07-28 NOTE — Patient Instructions (Signed)
Visit Information  PATIENT GOALS:  Goals Addressed             This Visit's Progress    RNCM: Prevent Falls and Injury       Follow Up Date 09-29-2020   - add more outdoor lighting - always use handrails on the stairs - always wear low-heeled or flat shoes or slippers with nonskid soles - call the doctor if I am feeling too drowsy - install bathroom grab bars - keep a flashlight by the bed - keep my cell phone with me always - learn how to get back up if I fall - make an emergency alert plan in case I fall - pick up clutter from the floors - use a nonslip pad with throw rugs, or remove them completely - use a cane or walker - use a nightlight in the bathroom - wear my glasses and/or hearing aid    Why is this important?   Most falls happen when it is hard for you to walk safely. Your balance may be off because of an illness. You may have pain in your knees, hip or other joints.  You may be overly tired or taking medicines that make you sleepy. You may not be able to see or hear clearly.  Falls can lead to broken bones, bruises or other injuries.  There are things you can do to help prevent falling.     Notes: patient had a fall last week when leaving a restaurant . 05-22-2020: The patient states that he had "near falls" yesterday but he caught himself. He has had falls since March. Has a toe that is bothering him. Extensive education about falls prevention and safety. 07-28-2020: The patient states he had a fall about 3 or 4 days ago. He was carrying a pot of soup outside and was watching his dog and mis-stepped and fell. He saved the pot of soup but he hit both knees. He also has a bruise where his key fob was in the pocket of his pants. His wife has gotten him a knee brace. He denies hitting his head. Denies any other injures. Review of safety and preventing falls.         Patient verbalizes understanding of instructions provided today and agrees to view in MyChart.    Telephone follow up appointment with care management team member scheduled for: 09-29-2020 at 230 pm  Alto Denver RN, MSN, CCM Community Care Coordinator [facility]  [facility] [place] Mobile: [phone]

## 2020-07-28 NOTE — Chronic Care Management (AMB) (Signed)
Chronic Care Management   CCM RN Visit Note  07/28/2020 Name: Luke Mckee MRN: [id number] DOB: Dec 30, 1940  Subjective: Luke Mckee is a 80 y.o. year old male who is a primary care patient of Olin Hauser, DO. The care management team was consulted for assistance with disease management and care coordination needs.    Engaged with patient by telephone for follow up visit in response to provider referral for case management and/or care coordination services.   Consent to Services:  The patient was given information about Chronic Care Management services, agreed to services, and gave verbal consent prior to initiation of services.  Please see initial visit note for detailed documentation.   Patient agreed to services and verbal consent obtained.   Assessment: Review of patient past medical history, allergies, medications, health status, including review of consultants reports, laboratory and other test data, was performed as part of comprehensive evaluation and provision of chronic care management services.   SDOH (Social Determinants of Health) assessments and interventions performed:  SDOH Interventions    Flowsheet Row Most Recent Value  SDOH Interventions   Physical Activity Interventions Other (Comments)  [uses a cubii and gets a lot of steps in, praised for changes]        CCM Care Plan  Allergies  Allergen Reactions   Ace Inhibitors    Cephalexin     Vomiting and diarrhea   Crestor [Rosuvastatin Calcium] Other (See Comments)    myalgia   Tape Rash    blistering    Outpatient Encounter Medications as of 07/28/2020  Medication Sig   acetaminophen (TYLENOL) 650 MG CR tablet Take 1,300 mg by mouth at bedtime as needed.   aspirin 81 MG chewable tablet Chew 81 mg by mouth daily.   atenolol (TENORMIN) 50 MG tablet Take 1 tablet (50 mg total) by mouth at bedtime.   atorvastatin (LIPITOR) 80 MG tablet Take 1 tablet (80 mg total) by mouth daily.   Cholecalciferol  (VITAMIN D) 50 MCG (2000 UT) tablet Take 4,000 Units by mouth daily.   clopidogrel (PLAVIX) 75 MG tablet TAKE 1 TABLET(75 MG) BY MOUTH DAILY   clotrimazole-betamethasone (LOTRISONE) cream Apply twice a day for 1-2 weeks, may repeat if need in future   diclofenac sodium (VOLTAREN) 1 % GEL Apply 2 g topically 4 (four) times daily as needed for pain. Foot pain   docusate sodium (COLACE) 100 MG capsule Take 200 mg by mouth daily.   furosemide (LASIX) 80 MG tablet Take 1 tablet (80 mg total) by mouth 2 (two) times daily.   isosorbide mononitrate (IMDUR) 30 MG 24 hr tablet Take 1 tablet (30 mg total) by mouth daily.   levothyroxine (SYNTHROID) 125 MCG tablet Take 1 tablet (125 mcg total) by mouth daily before breakfast.   loratadine (CLARITIN) 10 MG tablet Take 10 mg by mouth daily.   LORazepam (ATIVAN) 0.5 MG tablet Take 1 tablet (0.5 mg total) by mouth daily as needed for anxiety or sleep. (Patient not taking: Reported on 05/28/2020)   montelukast (SINGULAIR) 10 MG tablet Take 1 tablet (10 mg total) by mouth at bedtime.   Multiple Vitamin (MULTIVITAMIN) tablet Take 1 tablet by mouth daily.   nitroGLYCERIN (NITROSTAT) 0.4 MG SL tablet Place 1 tablet (0.4 mg total) under the tongue every 5 (five) minutes as needed for chest pain. (Patient not taking: Reported on 05/28/2020)   PATADAY 0.1 % ophthalmic solution Place into both eyes as needed.    senna (SENOKOT) 8.6 MG tablet Take 1  tablet by mouth daily.   sertraline (ZOLOFT) 100 MG tablet Take 1.5 tablets (150 mg total) by mouth daily.   SHINGRIX injection Inject 0.40mL into muscle once for 1st dose Shingrix then repeat in 2-6 months for 2nd dose.   spironolactone (ALDACTONE) 25 MG tablet TAKE 1/2 TABLET(12.5 MG) BY MOUTH DAILY   No facility-administered encounter medications on file as of 07/28/2020.    Patient Active Problem List   Diagnosis Date Noted   Hallux limitus, acquired, right 11/12/2019   Pre-diabetes 10/17/2019   Recurrent major  depressive disorder, in partial remission ([place]) 04/16/2019   Morbid obesity ([place]) 04/16/2019   Vasomotor rhinitis 10/25/2018   Hearing loss 08/28/2018   Osteoarthritis 08/28/2018   Tinnitus 08/28/2018   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 ([place]) 07/31/2018   Stroke ([place]) 07/24/2018   TIA (transient ischemic attack) 07/23/2018   Slurred speech 07/23/2018   Right arm weakness 07/23/2018   Hypokalemia 07/23/2018   Anxiety 07/20/2018   Atherosclerosis of native coronary artery of native heart with stable angina pectoris ([place]) 06/16/2018   Hx of CABG 06/16/2018   Mixed hyperlipidemia 06/16/2018   Benign essential HTN 06/16/2018   History of cerebrovascular accident (CVA) with residual deficit 28/00/3491   Systolic CHF ([place]) 79/15/0569   Hypertension 02/15/2018   S/P total knee arthroplasty, right 02/15/2018   Primary osteoarthritis of both knees 11/25/2017   Abnormal nuclear stress test 06/30/2016   Hallux limitus of right foot 10/22/2015   Anesthesia complication 79/48/0165   GERD (gastroesophageal reflux disease) 10/10/2015   Hypothyroidism 10/10/2015   OSA on CPAP 01/10/2007   Diagnosis unknown 01/12/2003    Conditions to be addressed/monitored:CHF, HTN, HLD, and falls prevention   Care Plan : RNCM: Heart Failure (Adult)  Updates made by Vanita Ingles since 07/28/2020 12:00 AM     Problem: RNCM: Symptom Exacerbation (Heart Failure)   Priority: Medium     Long-Range Goal: RNCM: Symptom Exacerbation Prevented or Minimized   Start Date: 03/13/2020  Expected End Date: 07/23/2021  This Visit's Progress: On track  Priority: Medium  Note:   Current Barriers:  Knowledge deficits related to basic heart failure pathophysiology and self care management Unable to independently manage HF Lacks social connections Does not contact provider office for questions/concerns Lack of scale in home Financial strain  Nurse Case Manager Clinical Goal(s):  Over  the next 120 days, patient will weigh self daily and record Over the next 120 days, patient will verbalize understanding of Heart Failure Action Plan and when to call doctor Over the next 120 days, patient will take all Heart Failure mediations as prescribed Interventions:  Collaboration with Olin Hauser, DO regarding development and update of comprehensive plan of care as evidenced by provider attestation and co-signature Inter-disciplinary care team collaboration (see longitudinal plan of care) Basic overview and discussion of pathophysiology of Heart Failure Provided written and verbal education on low sodium diet.  07-28-2020: Verbalized compliance with heart healthy diet. Reviewed Heart Failure Action Plan in depth and provided written copy. 05-22-2020: The patient is taking Lasix 80 mg BID. States he does not have swelling in his legs and feet today. He does still have the fungus on his toe nails. Denies any recent exacerbations of heart failure. 07-28-2020: Denies edema today. States he is compliant with his medications and dietary restrictions. Saw his cardiologist in June. No changes in the plan of care.  Assessed for scales in home. Has a scales but does not weight daily  Discussed importance of daily weight- 07-28-2020: The patient does not weight daily. States he knows when his weight fluctuates.  States his HF is well controlled at this time. Encouraged the patient to weigh daily.  Reviewed role of diuretics in prevention of fluid overload. 07-28-2020: Review of taking medications as directed. The patient verbalized compliance today. Will continue to monitor.   Patient Goals/Self-Care Activities Over the next 120 days, patient will:  - Take Heart Failure Medications as prescribed - Weigh daily and record (notify MD with 3 lb weight gain over night or 5 lb in a week) - Follow CHF Action Plan - Adhere to low sodium diet - barriers to lifestyle changes reviewed and addressed -  barriers to treatment reviewed and addressed - cognitive screening completed and reviewed - depression screen reviewed - health literacy screening completed or reviewed - rescue (action) plan reviewed - self-awareness of signs/symptoms of worsening disease encouraged  Follow Up Plan: Telephone follow up appointment with care management team member scheduled for:9-19--2022 at 230 pm    Task: RNCM: Identify and Minimize Risk of Heart Failure Exacerbation Completed 07/28/2020  Outcome: Positive  Note:   Care Management Activities:    - barriers to lifestyle changes reviewed and addressed - barriers to treatment reviewed and addressed - cognitive screening completed and reviewed - depression screen reviewed - health literacy screening completed or reviewed - rescue (action) plan reviewed - self-awareness of signs/symptoms of worsening disease encouraged        Care Plan : RNCM: Management of HTN  Updates made by Vanita Ingles since 07/28/2020 12:00 AM     Problem: RNCM: Hypertension (Hypertension)   Priority: Medium     Long-Range Goal: RNCM: Hypertension Monitored   Start Date: 03/13/2020  Expected End Date: 07/23/2021  This Visit's Progress: On track  Priority: Medium  Note:   Objective:  Last practice recorded BP readings:  BP Readings from Last 3 Encounters:  04/16/20 110/65  12/18/19 100/62  10/17/19 118/70   Most recent eGFR/CrCl: No results found for: EGFR  No components found for: CRCL Current Barriers:  Knowledge Deficits related to basic understanding of hypertension pathophysiology and self care management Knowledge Deficits related to understanding of medications prescribed for management of hypertension Unable to independently manage HTN Does not contact provider office for questions/concerns Case Manager Clinical Goal(s):  patient will verbalize understanding of plan for hypertension management patient will attend all scheduled medical appointments:  12-31-2020 at 220 pm patient will demonstrate improved adherence to prescribed treatment plan for hypertension as evidenced by taking all medications as prescribed, monitoring and recording blood pressure as directed, adhering to low sodium/DASH diet patient will demonstrate improved health management independence as evidenced by checking blood pressure as directed and notifying PCP if SBP>160 or DBP > 90, taking all medications as prescribe, and adhering to a low sodium diet as discussed. patient will verbalize basic understanding of hypertension disease process and self health management plan as evidenced by compliance with heart healthy diet, compliance with medications and working with the CCM team to optimize health and well being.  Interventions:  Collaboration with Olin Hauser, DO regarding development and update of comprehensive plan of care as evidenced by provider attestation and co-signature Inter-disciplinary care team collaboration (see longitudinal plan of care) Evaluation of current treatment plan related to hypertension self management and patient's adherence to plan as established by provider. Provided education to patient re: stroke prevention, s/s of heart attack and stroke, DASH diet, complications of uncontrolled  blood pressure Reviewed medications with patient and discussed importance of compliance Discussed plans with patient for ongoing care management follow up and provided patient with direct contact information for care management team Advised patient, providing education and rationale, to monitor blood pressure daily and record, calling PCP for findings outside established parameters.  Reviewed scheduled/upcoming provider appointments including: 12-31-2020 at 220 pm Self-Care Activities: - Self administers medications as prescribed Attends all scheduled provider appointments Calls provider office for new concerns, questions, or BP outside discussed  parameters Checks BP and records as discussed Follows a low sodium diet/DASH diet Patient Goals: - check blood pressure weekly - choose a place to take my blood pressure (home, clinic or office, retail store) - write blood pressure results in a log or diary - agree on reward when goals are met - agree to work together to make changes - ask questions to understand - have a family meeting to talk about healthy habits - learn about high blood pressure  Follow Up Plan: Telephone follow up appointment with care management team member scheduled for: 09-29-2020 at 230 pm    Task: RNCM: Identify and Monitor Blood Pressure Elevation Completed 07/28/2020  Outcome: Positive  Note:   Care Management Activities:    - blood pressure equipment and technique reviewed - blood pressure trends reviewed - depression screen reviewed - home or ambulatory blood pressure monitoring encouraged        Care Plan : RNCM: Management of HLD  Updates made by Vanita Ingles since 07/28/2020 12:00 AM     Problem: RNCM: HLD Health Promotion or Disease Self-Management (General Plan of Care)   Priority: Medium     Long-Range Goal: RNCM;HLD Self-Management Plan Developed   Start Date: 03/13/2020  Expected End Date: 07/23/2021  This Visit's Progress: On track  Priority: Medium  Note:   Current Barriers:  Poorly controlled hyperlipidemia, complicated by OSA,HTN, CAD, HLD, CVA Current antihyperlipidemic regimen: Atorvastatin 80 mg QD Most recent lipid panel:     Component Value Date/Time   CHOL 127 10/12/2019 0810   TRIG 123 10/12/2019 0810   HDL 41 10/12/2019 0810   CHOLHDL 3.1 10/12/2019 0810   VLDL 25 07/24/2018 0458   LDLCALC 66 10/12/2019 0810   ASCVD risk enhancing conditions: age >58, HTN, CKD, CHF Unable to independently HLD Does not contact provider office for questions/concerns RN Care Manager Clinical Goal(s):  patient will work with Consulting civil engineer, providers, and care team towards  execution of optimized self-health management plan patient will verbalize understanding of plan for effective management of HLD patient will work with [facility] and pcp  to address needs related to management of HLD  patient will demonstrate a decrease in HLD  exacerbations patient will take all medications exactly as prescribed and will call provider for medication related questions patient will verbalize basic understanding of HLD disease process and self health management plan patient will demonstrate understanding of rationale for each prescribed medication the patient will demonstrate ongoing self health care management ability Interventions: Collaboration with Olin Hauser, DO regarding development and update of comprehensive plan of care as evidenced by provider attestation and co-signature Inter-disciplinary care team collaboration (see longitudinal plan of care) Medication review performed; medication list updated in electronic medical record.  Inter-disciplinary care team collaboration (see longitudinal plan of care) Referred to pharmacy team for assistance with HLD medication management Evaluation of current treatment plan related to HLD  and patient's adherence to plan as established by provider. Advised patient to call  the office for changes in condition, questions, or concerns Reviewed medications with patient and discussed compliance Reviewed scheduled/upcoming provider appointments including: 12-31-2020 at 220 pm Discussed plans with patient for ongoing care management follow up and provided patient with direct contact information for care management team Patient Goals/Self-Care Activities: - call for medicine refill 2 or 3 days before it runs out - call if I am sick and can't take my medicine - keep a list of all the medicines I take; vitamins and herbals too - learn to read medicine labels - use a pillbox to sort medicine - use an alarm clock or phone to remind me to  take my medicine - change to whole grain breads, cereal, pasta - drink 6 to 8 glasses of water each day - eat 3 to 5 servings of fruits and vegetables each day - eat 5 or 6 small meals each day - eat fish at least once per week - fill half the plate with nonstarchy vegetables - limit fast food meals to no more than 1 per week - manage portion size - prepare main meal at home 3 to 5 days each week - read food labels for fat, fiber, carbohydrates and portion size - be open to making changes - I can manage, know and watch for signs of a heart attack - if I have chest pain, call for help - learn about small changes that will make a big difference - learn my personal risk factors  Follow Up Plan: Telephone follow up appointment with care management team member scheduled for: 09-29-2020 230 pm      Task: RNCM: HLD Mutually Develop and Royce Macadamia Achievement of Patient Goals Completed 07/28/2020  Outcome: Positive  Note:   Care Management Activities:    - barriers to meeting goals identified - choices provided - collaboration with team encouraged - decision-making supported - difficulty of making life-long changes acknowledged - health risks reviewed - problem-solving facilitated - questions answered - readiness for change evaluated - reassurance provided - resources needed to meet goals identified - self-reflection promoted - self-reliance encouraged        Care Plan : RNCM: Fall Risk (Adult)  Updates made by Vanita Ingles since 07/28/2020 12:00 AM     Problem: RNCM: Fall Risk   Priority: High     Long-Range Goal: RNCM: Absence of Fall and Fall-Related Injury   Start Date: 02/07/2020  Expected End Date: 07/23/2021  This Visit's Progress: Not on track  Priority: High  Note:   Current Barriers:  Knowledge Deficits related to fall precautions in patient with multiple chronic conditions Decreased adherence to prescribed treatment for fall prevention Lacks social  connections Does not contact provider office for questions/concerns Does not call office and report new falls when they occur Knowledge Deficits related to how to obtain handicap sticker to use with his car. 05-22-2020: Has handicap sticker for his care and states it is very helpful Chronic Disease Management support and education needs related to falls prevention and safety in patient with frequent falls due to unsteady gain and impaired mobility Lacks caregiver support.  Clinical Goal(s):  patient will demonstrate improved adherence to prescribed treatment plan for decreasing falls as evidenced by patient reporting and review of EMR patient will verbalize using fall risk reduction strategies discussed patient will not experience additional falls patient will verbalize understanding of plan for fall prevention and safety in the home patient will work with pcp, [facility] staff to address needs related to getting paperwork  for handicap sticker for the patients care- 05-22-2020: Completed and done.  patient will attend all scheduled medical appointments: No upcoming appointments. Knows to call the office for changes Interventions:  Collaboration with Olin Hauser, DO regarding development and update of comprehensive plan of care as evidenced by provider attestation and co-signature Inter-disciplinary care team collaboration (see longitudinal plan of care) Provided written and verbal education re: Potential causes of falls and Fall prevention strategies Reviewed medications and discussed potential side effects of medications such as dizziness and frequent urination. 05-22-2020: Denies any issues at this time with medications, dizziness. Does take Lasix that causes frequent urination. Review and education done. 07-28-2020: Denies any issues with medications causing dizziness, light headedness or issues related to his fall incidences. Will continue to monitor.  Assessed for s/s of orthostatic  hypotension. 07-28-2020: The patient denies any issues with blood pressure drops Assessed for falls since last encounter. 05-22-2020: The patient has had falls since March and had 2 "near falls" yesterday. States that the reason for falls is one of his toes is growing into the other toe and it is causing him to loose his balance. The patient states that he needs to be seen by a podiatrist. Is considering going to [facility] for this care, but did ask for recommendations by Dr. Parks Ranger. Will collaborate with pcp for recommendations on podiatrist. 07-28-2020: The patient states he had a fall 3 or 4 days ago when he was carrying a pot of soup out to the car and he looked away at his dog and then mis-stepped. He hit his knees but saved the pot of soup.  The patient states that he did not hit his head or fracture anything. Has places on bilateral knees and a bruise to his hip where the key fob was. He denies any acute distress. Reviewed with the patient safety and monitoring for changes in his environment. Will continue to monitor for changes.  Assessed patients knowledge of fall risk prevention secondary to previously provided education. 07-28-2020: Reviewed today wit the patient. Discussed the concern of increase risk of fractures or head injuries with frequent falls. The patient states he is trying to be safe. He does not use a cane or a walker. Encouraged the patient to consider using DME when ambutlating.  Assessed working status of life alert bracelet and patient adherence Provided patient information for fall alert systems Evaluation of current treatment plan related to falls prevention and safety  and patient's adherence to plan as established by provider. 07-28-2020: Education and support given on fall prevention and safety concerns as the patient has an impaired toe that is causing him to have an unsteady gait. Will collaborate with pcp for recommendations. Review of being safe in his environment.  Advised patient  to call the office for new falls, seek emergent help if needed for falls with injury and work with [facility] for fall prevention and safety  Provided education to patient re: being safe and monitoring for potential high fall risk areas, using DME to help with falls, reporting falls.  Reviewed scheduled/upcoming provider appointments including: No upcoming appointments noted. Knows to call for changes, encouraged patient to call to get an appointment with podiatrist. The patient does not wish to return to previous podiatrist.  Discussed plans with patient for ongoing care management follow up and provided patient with direct contact information for care management team Self-Care Deficits:  Unable to independently manage falls as evidence of fall last week when leaving a restaurant. 05-22-2020: Has  had reoccurrence of falls since last outreach in March.  Lacks social connections Does not contact provider office for questions/concerns Patient Goals:  - Utilize cane (assistive device) appropriately with all ambulation- states he has misplaced his cane but is actively looking for it. 05-22-2020: Review of use of DME when ambulating. The patient denies using a cane or a walker. Will consider.  - De-clutter walkways - Change positions slowly - Wear secure fitting shoes at all times with ambulation - Utilize home lighting for dim lit areas - Demonstrate self and pet awareness at all times - activities of daily living skills assessed - assistive or adaptive device use encouraged - barriers to physical activity or exercise addressed - barriers to physical activity or exercise identified - barriers to safety identified - cognition assessed - cognitive-stimulating activities promoted - fall prevention plan reviewed and updated - fear of falling, loss of independence and pain acknowledged - medication list reviewed - modification of home and work environment promoted Follow Up Plan: Telephone follow up  appointment with care management team member scheduled for: 09-29-2020 at 230 pm    Task: RNCM: Identify and Manage Contributors to Fall Risk Completed 07/28/2020  Outcome: Positive  Note:   Care Management Activities:    - activities of daily living skills assessed - assistive or adaptive device use encouraged - barriers to physical activity or exercise addressed - barriers to physical activity or exercise identified - barriers to safety identified - cognition assessed - cognitive-stimulating activities promoted - fall prevention plan reviewed and updated - fear of falling, loss of independence and pain acknowledged - medication list reviewed - modification of home and work environment promoted         Plan:Telephone follow up appointment with care management team member scheduled for:  09-29-2020 at 230 pm  [place], MSN, [place] [employer] Mobile: [phone]

## 2020-07-29 ENCOUNTER — Ambulatory Visit (INDEPENDENT_AMBULATORY_CARE_PROVIDER_SITE_OTHER): Payer: Medicare PPO

## 2020-07-29 VITALS — Ht 68.0 in | Wt 240.0 lb

## 2020-07-29 DIAGNOSIS — Z0000 Encounter for general adult medical examination without abnormal findings: Secondary | ICD-10-CM | POA: Diagnosis not present

## 2020-07-29 NOTE — Progress Notes (Signed)
I connected with Luke Mckee today by telephone and verified that I am speaking with the correct person using two identifiers. Location patient: home Location provider: work Persons participating in the virtual visit: Luke Mckee, Luke Mckee (wife), Luke Ponder LPN.   I discussed the limitations, risks, security and privacy concerns of performing an evaluation and management service by telephone and the availability of in person appointments. I also discussed with the patient that there may be a patient responsible charge related to this service. The patient expressed understanding and verbally consented to this telephonic visit.    Interactive audio and video telecommunications were attempted between this provider and patient, however failed, due to patient having technical difficulties OR patient did not have access to video capability.  We continued and completed visit with audio only.     Vital signs may be patient reported or missing.  Subjective:   Luke Mckee is a 80 y.o. male who presents for Medicare Annual/Subsequent preventive examination.  Review of Systems     Cardiac Risk Factors include: advanced age (>4men, >22 women);hypertension;male gender;obesity (BMI >30kg/m2)     Objective:    Today's Vitals   07/29/20 1352  Weight: 240 lb (108.9 kg)  Height:  (1.727 m)   Body mass index is 36.49 kg/m.  Advanced Directives 07/17/2019  Does Patient Have a Medical Advance Directive? No    Current Medications (verified) Outpatient Encounter Medications as of 07/29/2020  Medication Sig   acetaminophen (TYLENOL) 650 MG CR tablet Take 1,300 mg by mouth at bedtime as needed.   aspirin 81 MG chewable tablet Chew 81 mg by mouth daily.   atenolol (TENORMIN) 50 MG tablet Take 1 tablet (50 mg total) by mouth at bedtime.   atorvastatin (LIPITOR) 80 MG tablet Take 1 tablet (80 mg total) by mouth daily.   Cholecalciferol (VITAMIN D) 50 MCG (2000 UT) tablet Take 4,000 Units  by mouth daily.   clopidogrel (PLAVIX) 75 MG tablet TAKE 1 TABLET(75 MG) BY MOUTH DAILY   clotrimazole-betamethasone (LOTRISONE) cream Apply twice a day for 1-2 weeks, may repeat if need in future   diclofenac sodium (VOLTAREN) 1 % GEL Apply 2 g topically 4 (four) times daily as needed for pain. Foot pain   docusate sodium (COLACE) 100 MG capsule Take 200 mg by mouth daily.   furosemide (LASIX) 80 MG tablet Take 1 tablet (80 mg total) by mouth 2 (two) times daily.   isosorbide mononitrate (IMDUR) 30 MG 24 hr tablet Take 1 tablet (30 mg total) by mouth daily.   levothyroxine (SYNTHROID) 125 MCG tablet Take 1 tablet (125 mcg total) by mouth daily before breakfast.   loratadine (CLARITIN) 10 MG tablet Take 10 mg by mouth daily.   LORazepam (ATIVAN) 0.5 MG tablet Take 1 tablet (0.5 mg total) by mouth daily as needed for anxiety or sleep.   montelukast (SINGULAIR) 10 MG tablet Take 1 tablet (10 mg total) by mouth at bedtime.   Multiple Vitamin (MULTIVITAMIN) tablet Take 1 tablet by mouth daily.   nitroGLYCERIN (NITROSTAT) 0.4 MG SL tablet Place 1 tablet (0.4 mg total) under the tongue every 5 (five) minutes as needed for chest pain.   PATADAY 0.1 % ophthalmic solution Place into both eyes as needed.    senna (SENOKOT) 8.6 MG tablet Take 1 tablet by mouth daily.   sertraline (ZOLOFT) 100 MG tablet Take 1.5 tablets (150 mg total) by mouth daily.   spironolactone (ALDACTONE) 25 MG tablet TAKE 1/2 TABLET(12.5 MG) BY MOUTH  DAILY   SHINGRIX injection Inject 0.48mL into muscle once for 1st dose Shingrix then repeat in 2-6 months for 2nd dose.   No facility-administered encounter medications on file as of 07/29/2020.    Allergies (verified) Ace inhibitors, Cephalexin, Crestor [rosuvastatin calcium], and Tape   History: Past Medical History:  Diagnosis Date   Anxiety    Arthritis    CAD (coronary artery disease) 2005   s/p CABG, DES   CHF (congestive heart failure) (HCC)    in EPIC care everywhere    Chronic kidney disease    COPD (chronic obstructive pulmonary disease) (HCC)    in EPIC care everywhere   Depression    Heart murmur    History of stroke    Hypothyroidism    OSA (obstructive sleep apnea)    Paroxysmal A-fib (HCC)    in EPIC careeverywhere   Past Surgical History:  Procedure Laterality Date   APPENDECTOMY     BUNIONECTOMY     CATARACT EXTRACTION     Right eye   COLONOSCOPY  07/06/2004   CORONARY ARTERY BYPASS GRAFT  2005   LOOP RECORDER INSERTION N/A 07/25/2018   Procedure: LOOP RECORDER INSERTION;  Surgeon: Regan Lemming, MD;  Location: [facility];  Service: Cardiovascular;  Laterality: N/A;   TOTAL HIP ARTHROPLASTY Right 11/23/2011   TOTAL HIP ARTHROPLASTY Left 09/07/2011   TOTAL KNEE ARTHROPLASTY Right 02/15/2018   VASECTOMY  1978   Family History  Problem Relation Age of Onset   Anxiety disorder Sister    Social History   Socioeconomic History   Marital status: Married    Spouse name: Not on file   Number of children: Not on file   Years of education: Not on file   Highest education level: Not on file  Occupational History   Occupation: retired  Tobacco Use   Smoking status: Never   Smokeless tobacco: Never  Vaping Use   Vaping Use: Never used  Substance and Sexual Activity   Alcohol use: Not Currently    Comment: past   Drug use: Never   Sexual activity: Not on file  Other Topics Concern   Not on file  Social History Narrative   Not on file   Social Determinants of Health   Financial Resource Strain: Low Risk    Difficulty of Paying Living Expenses: Not hard at all  Food Insecurity: No Food Insecurity   Worried About Programme researcher, broadcasting/film/video in the Last Year: Never true   Ran Out of Food in the Last Year: Never true  Transportation Needs: No Transportation Needs   Lack of Transportation (Medical): No   Lack of Transportation (Non-Medical): No  Physical Activity: Sufficiently Active   Days of Exercise per Week: 5 days    Minutes of Exercise per Session: 30 min  Stress: No Stress Concern Present   Feeling of Stress : Not at all  Social Connections: Moderately Integrated   Frequency of Communication with Friends and Family: More than three times a week   Frequency of Social Gatherings with Friends and Family: More than three times a week   Attends Religious Services: More than 4 times per year   Active Member of [employer] or Organizations: No   Attends Banker Meetings: Never   Marital Status: Married    Tobacco Counseling Counseling given: Not Answered   Clinical Intake:  Pre-visit preparation completed: Yes  Pain : No/denies pain     Nutritional Status: BMI > 30  Obese Nutritional Risks: None Diabetes: No  How often do you need to have someone help you when you read instructions, pamphlets, or other written materials from your doctor or pharmacy?: 1 - Never 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 no  Interpreter Needed?: No  Information entered by :: NAllen LPN   Activities of Daily Living In your present state of health, do you have any difficulty performing the following activities: 07/29/2020  Hearing? Y  Comment hearing aides  Vision? N  Difficulty concentrating or making decisions? N  Walking or climbing stairs? Y  Dressing or bathing? N  Doing errands, shopping? N  Preparing Food and eating ? N  Using the Toilet? N  In the past six months, have you accidently leaked urine? N  Do you have problems with loss of bowel control? N  Managing your Medications? N  Managing your Finances? N  Housekeeping or managing your Housekeeping? N  Some recent data might be hidden    Patient Care Team: [name], DO as PCP - General (Family Medicine) Dhalla, Jackelyn Poling, RPH-CPP as Pharmacist Arlana Pouch Megan Mans, RN as Case Manager (General Practice)  Indicate any recent Medical Services you may have received from other than [facility]  providers in the past year (date may be approximate).     Assessment:   This is a routine wellness examination for Adrin.  Hearing/Vision screen Vision Screening - Comments:: Regular eye exams,  Dietary issues and exercise activities discussed: Current Exercise Habits: Home exercise routine, Type of exercise: stretching (cubi), Time (Minutes): 30, Frequency (Times/Week): 5, Weekly Exercise (Minutes/Week): 150   Goals Addressed             This Visit's Progress    Patient Stated       07/29/2020, live as long as he can       Depression Screen PHQ 2/9 Scores 07/29/2020 07/28/2020 04/16/2020 10/17/2019 07/17/2019 04/16/2019 03/05/2019  PHQ - 2 Score 0 0 0 0 1 2 0  PHQ- 9 Score - - 2 3 3 7  -    Fall Risk Fall Risk  07/29/2020 07/17/2019 10/25/2018 07/20/2018 06/07/2018  Falls in the past year? 1 1 0 1 0  Comment tripped and lost balance losing balance - - -  Number falls in past yr: 1 1 - 1 -  Injury with Fall? 0 0 - 0 -  Risk for fall due to : Impaired balance/gait;Medication side effect Impaired balance/gait;History of fall(s);Impaired mobility;Medication side effect - Impaired balance/gait -  Follow up Falls evaluation completed;Education provided;Falls prevention discussed Falls evaluation completed;Education provided;Falls prevention discussed Falls evaluation completed - Falls evaluation completed    FALL RISK PREVENTION PERTAINING TO THE HOME:  Any stairs in or around the home? Yes  If so, are there any without handrails? No  Home free of loose throw rugs in walkways, pet beds, electrical cords, etc? Yes  Adequate lighting in your home to reduce risk of falls? Yes   ASSISTIVE DEVICES UTILIZED TO PREVENT FALLS:  Life alert? No  Use of a cane, walker or w/c? No  Grab bars in the bathroom? Yes  Shower chair or bench in shower? No  Elevated toilet seat or a handicapped toilet? Yes   TIMED UP AND GO:  Was the test performed? No .      Cognitive Function:     6CIT  Screen 07/29/2020 07/17/2019  What Year? 0 points 0 points  What month? 0 points 0  points  What time? 0 points 0 points  Count back from 20 0 points 0 points  Months in reverse 0 points 0 points  Repeat phrase 4 points 4 points  Total Score 4 4    Immunizations Immunization History  Administered Date(s) Administered   Hepatitis A, Adult 09/23/1996, 03/16/1997   Influenza Split 10/11/2016   Influenza Whole 10/27/1996, 11/02/1997, 12/24/1998, 10/31/1999, 11/13/2000   Influenza, High Dose Seasonal PF 09/05/2018   Influenza-Unspecified 09/25/2019   Moderna Sars-Covid-2 Vaccination 02/23/2019, 03/24/2019, 11/14/2019, 06/03/2020   OPV 02/11/1985   PPD Test 10/31/1999, 11/13/2000   Pneumococcal Conjugate-13 11/08/2014   Pneumococcal Polysaccharide-23 11/23/2000, 03/01/2008   Td 09/12/1994, 03/01/2008   Typhoid Parenteral 09/23/1996   Typhoid Parenteral, AKD ([place] [employer]) 11/11/1993   Yellow Fever 01/12/1995   Zoster Recombinat (Shingrix) 06/25/2009, 07/24/2020    TDAP status: Due, Education has been provided regarding the importance of this vaccine. Advised may receive this vaccine at local pharmacy or Health Dept. Aware to provide a copy of the vaccination record if obtained from local pharmacy or Health Dept. Verbalized acceptance and understanding.  Flu Vaccine status: Up to date  Pneumococcal vaccine status: Up to date  Covid-19 vaccine status: Completed vaccines  Qualifies for Shingles Vaccine? Yes   Zostavax completed No   Shingrix Completed?: Yes  Screening Tests Health Maintenance  Topic Date Due   Hepatitis C Screening  Never done   TETANUS/TDAP  03/01/2018   INFLUENZA VACCINE  08/11/2020   COVID-19 Vaccine (5 - Booster for Moderna series) 10/04/2020   PNA vac Low Risk Adult  Completed   Zoster Vaccines- Shingrix  Completed   HPV VACCINES  Aged Out    Health Maintenance  Health Maintenance Due  Topic Date Due   Hepatitis C Screening  Never done   TETANUS/TDAP   03/01/2018    Colorectal cancer screening: Type of screening: Cologuard. Completed 07/25/2019. Repeat every 3 years  Lung Cancer Screening: (Low Dose CT Chest recommended if Age 33-80 years, 30 pack-year currently smoking OR have quit w/in 15years.) does not qualify.   Lung Cancer Screening Referral: no  Additional Screening:  Hepatitis C Screening: does qualify; due  Vision Screening: Recommended annual ophthalmology exams for early detection of glaucoma and other disorders of the eye. Is the patient up to date with their annual eye exam?  Yes  Who is the provider or what is the name of the office in which the patient attends annual eye exams? Can't remember name If pt is not established with a provider, would they like to be referred to a provider to establish care? No .   Dental Screening: Recommended annual dental exams for proper oral hygiene  Community Resource Referral / Chronic Care Management: CRR required this visit?  No   CCM required this visit?  No      Plan:     I have personally reviewed and noted the following in the patient's chart:   Medical and social history Use of alcohol, tobacco or illicit drugs  Current medications and supplements including opioid prescriptions. Patient is not currently taking opioid prescriptions. Functional ability and status Nutritional status Physical activity Advanced directives List of other physicians Hospitalizations, surgeries, and ER visits in previous 12 months Vitals Screenings to include cognitive, depression, and falls Referrals and appointments  In addition, I have reviewed and discussed with patient certain preventive protocols, quality metrics, and best practice recommendations. A written personalized care plan for preventive services as well as general preventive health recommendations  were provided to patient.     Barb Merinoickeah E Brit Wernette, LPN   [id number]/09607/[id number]/2022   Nurse Notes:

## 2020-07-29 NOTE — Patient Instructions (Signed)
Luke Mckee , Thank you for taking time to come for your Medicare Wellness Visit. I appreciate your ongoing commitment to your health goals. Please review the following plan we discussed and let me know if I can assist you in the future.   Screening recommendations/referrals: Colonoscopy: cologuard 07/25/2019, due 07/25/2022 Recommended yearly ophthalmology/optometry visit for glaucoma screening and checkup Recommended yearly dental visit for hygiene and checkup  Vaccinations: Influenza vaccine: completed 09/25/2019, due 08/11/2020 Pneumococcal vaccine: completed 11/08/2014 Tdap vaccine: due Shingles vaccine: completed   Covid-19:  06/03/2020, 11/14/2019, 03/24/2019, 02/23/2019,   Advanced directives: Advance directive discussed with you today.   Conditions/risks identified: none  Next appointment: Follow up in one year for your annual wellness visit.   Preventive Care 79 Years and Older, Male Preventive care refers to lifestyle choices and visits with your health care provider that can promote health and wellness. What does preventive care include? A yearly physical exam. This is also called an annual well check. Dental exams once or twice a year. Routine eye exams. Ask your health care provider how often you should have your eyes checked. Personal lifestyle choices, including: Daily care of your teeth and gums. Regular physical activity. Eating a healthy diet. Avoiding tobacco and drug use. Limiting alcohol use. Practicing safe sex. Taking low doses of aspirin every day. Taking vitamin and mineral supplements as recommended by your health care provider. What happens during an annual well check? The services and screenings done by your health care provider during your annual well check will depend on your age, overall health, lifestyle risk factors, and family history of disease. Counseling  Your health care provider may ask you questions about your: Alcohol use. Tobacco use. Drug  use. Emotional well-being. Home and relationship well-being. Sexual activity. Eating habits. History of falls. Memory and ability to understand (cognition). Work and work Astronomer. Screening  You may have the following tests or measurements: Height, weight, and BMI. Blood pressure. Lipid and cholesterol levels. These may be checked every 5 years, or more frequently if you are over 86 years old. Skin check. Lung cancer screening. You may have this screening every year starting at age 57 if you have a 30-pack-year history of smoking and currently smoke or have quit within the past 15 years. Fecal occult blood test (FOBT) of the stool. You may have this test every year starting at age 53. Flexible sigmoidoscopy or colonoscopy. You may have a sigmoidoscopy every 5 years or a colonoscopy every 10 years starting at age 102. Prostate cancer screening. Recommendations will vary depending on your family history and other risks. Hepatitis C blood test. Hepatitis B blood test. Sexually transmitted disease (STD) testing. Diabetes screening. This is done by checking your blood sugar (glucose) after you have not eaten for a while (fasting). You may have this done every 1-3 years. Abdominal aortic aneurysm (AAA) screening. You may need this if you are a current or former smoker. Osteoporosis. You may be screened starting at age 19 if you are at high risk. Talk with your health care provider about your test results, treatment options, and if necessary, the need for more tests. Vaccines  Your health care provider may recommend certain vaccines, such as: Influenza vaccine. This is recommended every year. Tetanus, diphtheria, and acellular pertussis (Tdap, Td) vaccine. You may need a Td booster every 10 years. Zoster vaccine. You may need this after age 12. Pneumococcal 13-valent conjugate (PCV13) vaccine. One dose is recommended after age 6. Pneumococcal polysaccharide (PPSV23) vaccine. One  dose is  recommended after age 47. Talk to your health care provider about which screenings and vaccines you need and how often you need them. This information is not intended to replace advice given to you by your health care provider. Make sure you discuss any questions you have with your health care provider. Document Released: 01/24/2015 Document Revised: 09/17/2015 Document Reviewed: 10/29/2014 Elsevier Interactive Patient Education  2017 [place] Prevention in the Home Falls can cause injuries. They can happen to people of all ages. There are many things you can do to make your home safe and to help prevent falls. What can I do on the outside of my home? Regularly fix the edges of walkways and driveways and fix any cracks. Remove anything that might make you trip as you walk through a door, such as a raised step or threshold. Trim any bushes or trees on the path to your home. Use bright outdoor lighting. Clear any walking paths of anything that might make someone trip, such as rocks or tools. Regularly check to see if handrails are loose or broken. Make sure that both sides of any steps have handrails. Any raised decks and porches should have guardrails on the edges. Have any leaves, snow, or ice cleared regularly. Use sand or salt on walking paths during winter. Clean up any spills in your garage right away. This includes oil or grease spills. What can I do in the bathroom? Use night lights. Install grab bars by the toilet and in the tub and shower. Do not use towel bars as grab bars. Use non-skid mats or decals in the tub or shower. If you need to sit down in the shower, use a plastic, non-slip stool. Keep the floor dry. Clean up any water that spills on the floor as soon as it happens. Remove soap buildup in the tub or shower regularly. Attach bath mats securely with double-sided non-slip rug tape. Do not have throw rugs and other things on the floor that can make you  trip. What can I do in the bedroom? Use night lights. Make sure that you have a light by your bed that is easy to reach. Do not use any sheets or blankets that are too big for your bed. They should not hang down onto the floor. Have a firm chair that has side arms. You can use this for support while you get dressed. Do not have throw rugs and other things on the floor that can make you trip. What can I do in the kitchen? Clean up any spills right away. Avoid walking on wet floors. Keep items that you use a lot in easy-to-reach places. If you need to reach something above you, use a strong step stool that has a grab bar. Keep electrical cords out of the way. Do not use floor polish or wax that makes floors slippery. If you must use wax, use non-skid floor wax. Do not have throw rugs and other things on the floor that can make you trip. What can I do with my stairs? Do not leave any items on the stairs. Make sure that there are handrails on both sides of the stairs and use them. Fix handrails that are broken or loose. Make sure that handrails are as long as the stairways. Check any carpeting to make sure that it is firmly attached to the stairs. Fix any carpet that is loose or worn. Avoid having throw rugs at the top or bottom of the  stairs. If you do have throw rugs, attach them to the floor with carpet tape. Make sure that you have a light switch at the top of the stairs and the bottom of the stairs. If you do not have them, ask someone to add them for you. What else can I do to help prevent falls? Wear shoes that: Do not have high heels. Have rubber bottoms. Are comfortable and fit you well. Are closed at the toe. Do not wear sandals. If you use a stepladder: Make sure that it is fully opened. Do not climb a closed stepladder. Make sure that both sides of the stepladder are locked into place. Ask someone to hold it for you, if possible. Clearly mark and make sure that you can  see: Any grab bars or handrails. First and last steps. Where the edge of each step is. Use tools that help you move around (mobility aids) if they are needed. These include: Canes. Walkers. Scooters. Crutches. Turn on the lights when you go into a dark area. Replace any light bulbs as soon as they burn out. Set up your furniture so you have a clear path. Avoid moving your furniture around. If any of your floors are uneven, fix them. If there are any pets around you, be aware of where they are. Review your medicines with your doctor. Some medicines can make you feel dizzy. This can increase your chance of falling. Ask your doctor what other things that you can do to help prevent falls. This information is not intended to replace advice given to you by your health care provider. Make sure you discuss any questions you have with your health care provider. Document Released: 10/24/2008 Document Revised: 06/05/2015 Document Reviewed: 02/01/2014 Elsevier Interactive Patient Education  2017 [employer].

## 2020-07-30 LAB — CUP PACEART REMOTE DEVICE CHECK
Date Time Interrogation Session: 20220719231932
Implantable Pulse Generator Implant Date: 20200714

## 2020-08-04 ENCOUNTER — Ambulatory Visit (INDEPENDENT_AMBULATORY_CARE_PROVIDER_SITE_OTHER): Payer: Medicare PPO

## 2020-08-04 DIAGNOSIS — I63412 Cerebral infarction due to embolism of left middle cerebral artery: Secondary | ICD-10-CM

## 2020-08-27 ENCOUNTER — Ambulatory Visit (INDEPENDENT_AMBULATORY_CARE_PROVIDER_SITE_OTHER): Payer: Medicare PPO | Admitting: Pharmacist

## 2020-08-27 DIAGNOSIS — I10 Essential (primary) hypertension: Secondary | ICD-10-CM | POA: Diagnosis not present

## 2020-08-27 DIAGNOSIS — F411 Generalized anxiety disorder: Secondary | ICD-10-CM | POA: Diagnosis not present

## 2020-08-27 DIAGNOSIS — R5383 Other fatigue: Secondary | ICD-10-CM | POA: Diagnosis not present

## 2020-08-27 DIAGNOSIS — Z8673 Personal history of transient ischemic attack (TIA), and cerebral infarction without residual deficits: Secondary | ICD-10-CM | POA: Diagnosis not present

## 2020-08-27 DIAGNOSIS — I25118 Atherosclerotic heart disease of native coronary artery with other forms of angina pectoris: Secondary | ICD-10-CM

## 2020-08-27 NOTE — Progress Notes (Signed)
Carelink Summary Report / Loop Recorder 

## 2020-08-27 NOTE — Patient Instructions (Signed)
Visit Information  PATIENT GOALS:  Goals Addressed             This Visit's Progress    Pharmacy Goals       It was great talking with you today!  Please check your blood pressure 1-2 times/week at home and keep a record to bring to medical appointments  Our goal bad cholesterol, or LDL, is less than 70 . This is why it is important to continue taking your atorvastatin  Feel free to call me with any questions or concerns. I look forward to our next call!  Estelle Grumbles, PharmD, BCACP Clinical Pharmacist [facility] [phone]          The patient verbalized understanding of instructions, educational materials, and care plan provided today and declined offer to receive copy of patient instructions, educational materials, and care plan.   Telephone follow up appointment with care management team member scheduled for: 11/03/2020 at 11:15 AM

## 2020-08-27 NOTE — Chronic Care Management (AMB) (Signed)
Chronic Care Management Pharmacy Note  08/27/2020 Name:  Cary Lothrop MRN:  [id number] DOB:  [birth year]   Subjective: Matthe Sloane is an 80 y.o. year old male who is a primary patient of Olin Hauser, DO.  The CCM team was consulted for assistance with disease management and care coordination needs.    Engaged with patient by telephone for follow up visit in response to provider referral for pharmacy case management and/or care coordination services.   Consent to Services:  The patient was given information about Chronic Care Management services, agreed to services, and gave verbal consent prior to initiation of services.  Please see initial visit note for detailed documentation.   Patient Care Team: Olin Hauser, DO as PCP - General (Family Medicine) Curley Spice Virl Diamond, RPH-CPP as Pharmacist Vanita Ingles, RN as Case Manager (General Practice)  Recent office visits: None  Hospital visits: None in previous 6 months  Objective:  Lab Results  Component Value Date   CREATININE 0.99 10/12/2019   CREATININE 0.81 08/03/2018   CREATININE 0.84 07/25/2018       Component Value Date/Time   CHOL 127 10/12/2019 0810   TRIG 123 10/12/2019 0810   HDL 41 10/12/2019 0810   CHOLHDL 3.1 10/12/2019 0810   VLDL 25 07/24/2018 0458   LDLCALC 66 10/12/2019 0810    Hepatic Function Latest Ref Rng & Units 10/12/2019 07/24/2018 07/23/2018  Total Protein 6.1 - 8.1 g/dL 6.9 6.6 7.3  Albumin 3.5 - 5.0 g/dL - 3.4(L) 3.8  AST 10 - 35 U/L $Remo'16 17 21  'TIbdf$ ALT 9 - 46 U/L $Remo'14 17 20  'XzLer$ Alk Phosphatase 38 - 126 U/L - 47 54  Total Bilirubin 0.2 - 1.2 mg/dL 0.4 0.8 0.7    Clinical ASCVD: Yes  The ASCVD Risk score Mikey Bussing DC Jr., et al., 2013) failed to calculate for the following reasons:   The patient has a prior MI or stroke diagnosis     Social History   Tobacco Use  Smoking Status Never  Smokeless Tobacco Never   BP Readings from Last 3 Encounters:  04/16/20 110/65   12/18/19 100/62  10/17/19 118/70   Pulse Readings from Last 3 Encounters:  04/16/20 74  12/18/19 71  10/17/19 69   Wt Readings from Last 3 Encounters:  07/29/20 240 lb (108.9 kg)  04/16/20 247 lb 9.6 oz (112.3 kg)  12/18/19 247 lb (112 kg)    Assessment: Review of patient past medical history, allergies, medications, health status, including review of consultants reports, laboratory and other test data, was performed as part of comprehensive evaluation and provision of chronic care management services.   SDOH:  (Social Determinants of Health) assessments and interventions performed: none   CCM Care Plan  Allergies  Allergen Reactions   Ace Inhibitors    Cephalexin     Vomiting and diarrhea   Crestor [Rosuvastatin Calcium] Other (See Comments)    myalgia   Tape Rash    blistering    Medications Reviewed Today     Reviewed by Kellie Simmering, LPN (Licensed Practical Nurse) on 07/29/20 at 68  Med List Status: <None>   Medication Order Taking? Sig Documenting Provider Last Dose Status Informant  acetaminophen (TYLENOL) 650 MG CR tablet [id number] Yes Take 1,300 mg by mouth at bedtime as needed. [provider] Taking Active Spouse/Significant Other  aspirin 81 MG chewable tablet [id number] Yes Chew 81 mg by mouth daily. [provider] Taking Active   atenolol (TENORMIN)  50 MG tablet [id number] Yes Take 1 tablet (50 mg total) by mouth at bedtime. [name], DO Taking Active   atorvastatin (LIPITOR) 80 MG tablet [id number] Yes Take 1 tablet (80 mg total) by mouth daily. [name], DO Taking Active   Cholecalciferol (VITAMIN D) 50 MCG (2000 UT) tablet [id number] Yes Take 4,000 Units by mouth daily. [provider] Taking Active Spouse/Significant Other  clopidogrel (PLAVIX) 75 MG tablet [id number] Yes TAKE 1 TABLET(75 MG) BY MOUTH DAILY Gollan, Tollie Pizza, MD Taking Active   clotrimazole-betamethasone (LOTRISONE) cream  [id number] Yes Apply twice a day for 1-2 weeks, may repeat if need in future [name], DO Taking Active   diclofenac sodium (VOLTAREN) 1 % GEL [id number] Yes Apply 2 g topically 4 (four) times daily as needed for pain. Foot pain [provider] Taking Active Spouse/Significant Other  docusate sodium (COLACE) 100 MG capsule [id number] Yes Take 200 mg by mouth daily. [provider] Taking Active Spouse/Significant Other  furosemide (LASIX) 80 MG tablet [id number] Yes Take 1 tablet (80 mg total) by mouth 2 (two) times daily. [name], DO Taking Active   isosorbide mononitrate (IMDUR) 30 MG 24 hr tablet [id number] Yes Take 1 tablet (30 mg total) by mouth daily. [name], DO Taking Active   levothyroxine (SYNTHROID) 125 MCG tablet [id number] Yes Take 1 tablet (125 mcg total) by mouth daily before breakfast. [name], DO Taking Active   loratadine (CLARITIN) 10 MG tablet [id number] Yes Take 10 mg by mouth daily. [provider] Taking Active   LORazepam (ATIVAN) 0.5 MG tablet [id number] Yes Take 1 tablet (0.5 mg total) by mouth daily as needed for anxiety or sleep. [name], DO Taking Active   montelukast (SINGULAIR) 10 MG tablet [id number] Yes Take 1 tablet (10 mg total) by mouth at bedtime. [name], DO Taking Active   Multiple Vitamin (MULTIVITAMIN) tablet [id number] Yes Take 1 tablet by mouth daily. [provider] Taking Active Spouse/Significant Other  nitroGLYCERIN (NITROSTAT) 0.4 MG SL tablet [id number] Yes Place 1 tablet (0.4 mg total) under the tongue every 5 (five) minutes as needed for chest pain. [name], DO Taking Active   PATADAY 0.1 % ophthalmic solution [id number] Yes Place into both eyes as needed.  [provider] Taking Active   senna (SENOKOT) 8.6 MG tablet [id number] Yes Take 1 tablet by mouth daily. [provider] Taking Active    sertraline (ZOLOFT) 100 MG tablet [id number] Yes Take 1.5 tablets (150 mg total) by mouth daily. [name], DO Taking Active   [facility] injection [id number]  Inject 0.55mL into muscle once for 1st dose Shingrix then repeat in 2-6 months for 2nd dose. [name], DO  Active   spironolactone (ALDACTONE) 25 MG tablet [id number] Yes TAKE 1/2 TABLET(12.5 MG) BY MOUTH DAILY [name], DO Taking Active             Patient Active Problem List   Diagnosis Date Noted   Hallux limitus, acquired, right 11/12/2019   Pre-diabetes 10/17/2019   Recurrent major depressive disorder, in partial remission (HCC) 04/16/2019   Morbid obesity (HCC) 04/16/2019   Vasomotor rhinitis 10/25/2018   Hearing loss 08/28/2018   Osteoarthritis 08/28/2018   Tinnitus 08/28/2018   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 (HCC) 07/31/2018   Stroke (HCC) 07/24/2018   TIA (transient ischemic attack) 07/23/2018   Slurred speech 07/23/2018   Right arm weakness 07/23/2018  Hypokalemia 07/23/2018   Anxiety 07/20/2018   Atherosclerosis of native coronary artery of native heart with stable angina pectoris ([place]) 06/16/2018   Hx of CABG 06/16/2018   Mixed hyperlipidemia 06/16/2018   Benign essential HTN 06/16/2018   History of cerebrovascular accident (CVA) with residual deficit 26/33/3545   Systolic CHF ([place]) 62/56/3893   Hypertension 02/15/2018   S/P total knee arthroplasty, right 02/15/2018   Primary osteoarthritis of both knees 11/25/2017   Abnormal nuclear stress test 06/30/2016   Hallux limitus of right foot 10/22/2015   Anesthesia complication 73/42/8768   GERD (gastroesophageal reflux disease) 10/10/2015   Hypothyroidism 10/10/2015   OSA on CPAP 01/10/2007   Diagnosis unknown 01/12/2003    Immunization History  Administered Date(s) Administered   Hepatitis A, Adult 09/23/1996, 03/16/1997   Influenza Split 10/11/2016   Influenza Whole  10/27/1996, 11/02/1997, 12/24/1998, 10/31/1999, 11/13/2000   Influenza, High Dose Seasonal PF 09/05/2018   Influenza-Unspecified 09/25/2019   Moderna Sars-Covid-2 Vaccination 02/23/2019, 03/24/2019, 11/14/2019, 06/03/2020   OPV 02/11/1985   PPD Test 10/31/1999, 11/13/2000   Pneumococcal Conjugate-13 11/08/2014   Pneumococcal Polysaccharide-23 11/23/2000, 03/01/2008   Td 09/12/1994, 03/01/2008   Typhoid Parenteral 09/23/1996   Typhoid Parenteral, AKD ([place] [employer]) 11/11/1993   Yellow Fever 01/12/1995   Zoster Recombinat (Shingrix) 06/25/2009, 07/24/2020    Conditions to be addressed/monitored: HTN, HLD, hypothyroidism  Care Plan : PharmD - Med Management  Updates made by Rennis Petty, RPH-CPP since 08/27/2020 12:00 AM     Problem: Disease Progression      Long-Range Goal: Disease Progression Prevented or Minimized   Start Date: 02/04/2020  Expected End Date: 05/04/2020  This Visit's Progress: On track  Recent Progress: On track  Priority: High  Note:   Current Barriers:  Chronic Disease Management support, education, and care coordination needs related to CAD, HTN, HLD, and hx CVA  Pharmacist Clinical Goal(s):  Over the next 90 days, patient will adhere to plan to optimize therapeutic regimen for hypothyroidism as evidenced by report of adherence to recommended medication management changes through collaboration with PharmD and provider.    Interventions: 1:1 collaboration with Olin Hauser, DO regarding development and update of comprehensive plan of care as evidenced by provider attestation and co-signature Inter-disciplinary care team collaboration (see longitudinal plan of care) Reports received second dose of Shingrix vaccine in July Reports planning to obtain flu vaccine in September/October Reports taking coenzyme Q10 supplement for memory benefit and prevention of statin-associated muscle effects Advise patient that based on data from [facility] [place], data shows coenzyme Q10 as possibly ineffective for improving cognition and conflicting evidence regarding the effects of oral coenzyme Q10 on statin-associated muscular adverse effects, such as myalgia Spouse reports patient will trial to see if offers any improvement Report had a fall ~2 weeks ago. Reports was coming out of house to go down front steps and attributes to having his hands full/lean over too far.  Had bruising on knee and leg, but denies other injury Denies dizziness prior to fall Discuss fall prevention. Confirms has railings at home, including on front steps. Encourage patient to be sure to keep hand free to use railing for support.  Remind patient/wife to continue to be cautious with use of as needed lorazepam for risk of dizziness/falls Reports sits/lays down after taking  Seasonal allergies: Current treatment: Loratadine 10 mg daily Montelukast 10 mg QHS Reports allergy symptoms improved since started montelukast  Hypothyroidism Controled; current treatment: levothyroxine 125 mcg daily before breakfast Reports patient continues  to levothyroxine consistently in the morning on an empty stomach, at least 30 minutes before food and at least 4 hours from calcium- or iron-containing products using new weekly pill organizer  Hypertension Controlled; current treatment: Atenolol 50 mg daily at bedtime Furosemide 80 mg twice daily Isosorbide ER 30 mg once daily Spironolactone 25 mg - 1/2 tablet (12.5 mg) once daily Reports last checked BP: 8/10: 135/72, HR 65 Encourage to continue to monitor home BP 1-2 times/week, keep log of results and call providers for readings outside of established parameters.   Patient Goals/Self-Care Activities Over the next 90 days, patient will:  - take medications as prescribed with assistance of wife   Note: patient uses weekly pillbox as filled by wife - check blood pressure, document, and provide at future  appointments  Follow Up Plan: Telephone follow up appointment with care management team member scheduled for: 8/17 at 11:15 am      [name] preferred pharmacy is:  [facility], [place] [address] [place] [address] Phone: [phone] Fax: [phone]  [facility] - [address], [place] [address] Phone: [phone] Fax: [phone]   Follow Up:  Patient agrees to Care Plan and Follow-up.  Wallace Cullens, PharmD, Para [place], CPP Clinical Pharmacist [facility] [phone]

## 2020-08-29 ENCOUNTER — Other Ambulatory Visit: Payer: Self-pay | Admitting: Family Medicine

## 2020-08-29 DIAGNOSIS — I10 Essential (primary) hypertension: Secondary | ICD-10-CM

## 2020-08-29 DIAGNOSIS — I25810 Atherosclerosis of coronary artery bypass graft(s) without angina pectoris: Secondary | ICD-10-CM

## 2020-09-08 ENCOUNTER — Ambulatory Visit (INDEPENDENT_AMBULATORY_CARE_PROVIDER_SITE_OTHER): Payer: Medicare PPO

## 2020-09-08 DIAGNOSIS — I63412 Cerebral infarction due to embolism of left middle cerebral artery: Secondary | ICD-10-CM | POA: Diagnosis not present

## 2020-09-09 LAB — CUP PACEART REMOTE DEVICE CHECK
Date Time Interrogation Session: 20220821235345
Implantable Pulse Generator Implant Date: 20200714

## 2020-09-19 NOTE — Progress Notes (Signed)
Carelink Summary Report / Loop Recorder 

## 2020-09-29 ENCOUNTER — Telehealth: Payer: Medicare PPO | Admitting: General Practice

## 2020-09-29 ENCOUNTER — Ambulatory Visit (INDEPENDENT_AMBULATORY_CARE_PROVIDER_SITE_OTHER): Payer: Medicare PPO

## 2020-09-29 DIAGNOSIS — I5022 Chronic systolic (congestive) heart failure: Secondary | ICD-10-CM

## 2020-09-29 DIAGNOSIS — E782 Mixed hyperlipidemia: Secondary | ICD-10-CM

## 2020-09-29 DIAGNOSIS — R296 Repeated falls: Secondary | ICD-10-CM

## 2020-09-29 DIAGNOSIS — I10 Essential (primary) hypertension: Secondary | ICD-10-CM

## 2020-09-29 NOTE — Patient Instructions (Signed)
Visit Information  PATIENT GOALS:  Goals Addressed             This Visit's Progress    RNCM: Prevent Falls and Injury       Follow Up Date 11-24-2020   - add more outdoor lighting - always use handrails on the stairs - always wear low-heeled or flat shoes or slippers with nonskid soles - call the doctor if I am feeling too drowsy - install bathroom grab bars - keep a flashlight by the bed - keep my cell phone with me always - learn how to get back up if I fall - make an emergency alert plan in case I fall - pick up clutter from the floors - use a nonslip pad with throw rugs, or remove them completely - use a cane or walker - use a nightlight in the bathroom - wear my glasses and/or hearing aid    Why is this important?   Most falls happen when it is hard for you to walk safely. Your balance may be off because of an illness. You may have pain in your knees, hip or other joints.  You may be overly tired or taking medicines that make you sleepy. You may not be able to see or hear clearly.  Falls can lead to broken bones, bruises or other injuries.  There are things you can do to help prevent falling.     Notes: patient had a fall last week when leaving a restaurant . 05-22-2020: The patient states that he had "near falls" yesterday but he caught himself. He has had falls since March. Has a toe that is bothering him. Extensive education about falls prevention and safety. 07-28-2020: The patient states he had a fall about 3 or 4 days ago. He was carrying a pot of soup outside and was watching his dog and mis-stepped and fell. He saved the pot of soup but he hit both knees. He also has a bruise where his key fob was in the pocket of his pants. His wife has gotten him a knee brace. He denies hitting his head. Denies any other injures. Review of safety and preventing falls. 09-29-2020: The patient denies any new falls. The patient states that he is doing well and exercising more. He is doing  a 1000 steps forward and 1000 steps backwards on the cubie daily. He is also planning on going on a diet with his daughters boyfriend. States he wants to lose some weight and he felt having a partner would be helpful . Education on pacing activity and monitoring for changes in condition.         Patient verbalizes understanding of instructions provided today and agrees to view in MyChart.   Telephone follow up appointment with care management team member scheduled for: 11-24-2020 at 230 pm   Alto Denver RN, MSN, CCM Community Care Coordinator [facility]  [facility] [place] Mobile: [phone]

## 2020-09-29 NOTE — Chronic Care Management (AMB) (Signed)
Chronic Care Management   CCM RN Visit Note  09/29/2020 Name: Luke Mckee MRN: [id number] DOB: [birth year]  Subjective: Luke Mckee is a 80 y.o. year old male who is a primary care patient of Olin Hauser, DO. The care management team was consulted for assistance with disease management and care coordination needs.    Engaged with patient by telephone for follow up visit in response to provider referral for case management and/or care coordination services.   Consent to Services:  The patient was given information about Chronic Care Management services, agreed to services, and gave verbal consent prior to initiation of services.  Please see initial visit note for detailed documentation.   Patient agreed to services and verbal consent obtained.   Assessment: Review of patient past medical history, allergies, medications, health status, including review of consultants reports, laboratory and other test data, was performed as part of comprehensive evaluation and provision of chronic care management services.   SDOH (Social Determinants of Health) assessments and interventions performed:    CCM Care Plan  Allergies  Allergen Reactions   Ace Inhibitors    Cephalexin     Vomiting and diarrhea   Crestor [Rosuvastatin Calcium] Other (See Comments)    myalgia   Tape Rash    blistering    Outpatient Encounter Medications as of 09/29/2020  Medication Sig   acetaminophen (TYLENOL) 650 MG CR tablet Take 1,300 mg by mouth at bedtime as needed.   aspirin 81 MG chewable tablet Chew 81 mg by mouth daily.   atenolol (TENORMIN) 50 MG tablet Take 1 tablet (50 mg total) by mouth at bedtime.   atorvastatin (LIPITOR) 80 MG tablet Take 1 tablet (80 mg total) by mouth daily.   Cholecalciferol (VITAMIN D) 50 MCG (2000 UT) tablet Take 4,000 Units by mouth daily.   clopidogrel (PLAVIX) 75 MG tablet TAKE 1 TABLET(75 MG) BY MOUTH DAILY   clotrimazole-betamethasone (LOTRISONE) cream Apply twice  a day for 1-2 weeks, may repeat if need in future   co-enzyme Q-10 30 MG capsule Take 100 mg by mouth daily.   diclofenac sodium (VOLTAREN) 1 % GEL Apply 2 g topically 4 (four) times daily as needed for pain. Foot pain   docusate sodium (COLACE) 100 MG capsule Take 200 mg by mouth daily.   furosemide (LASIX) 80 MG tablet Take 1 tablet (80 mg total) by mouth 2 (two) times daily.   isosorbide mononitrate (IMDUR) 30 MG 24 hr tablet Take 1 tablet (30 mg total) by mouth daily.   levothyroxine (SYNTHROID) 125 MCG tablet Take 1 tablet (125 mcg total) by mouth daily before breakfast.   loratadine (CLARITIN) 10 MG tablet Take 10 mg by mouth daily.   LORazepam (ATIVAN) 0.5 MG tablet Take 1 tablet (0.5 mg total) by mouth daily as needed for anxiety or sleep.   montelukast (SINGULAIR) 10 MG tablet Take 1 tablet (10 mg total) by mouth at bedtime.   Multiple Vitamin (MULTIVITAMIN) tablet Take 1 tablet by mouth daily.   nitroGLYCERIN (NITROSTAT) 0.4 MG SL tablet Place 1 tablet (0.4 mg total) under the tongue every 5 (five) minutes as needed for chest pain.   PATADAY 0.1 % ophthalmic solution Place into both eyes as needed.    senna (SENOKOT) 8.6 MG tablet Take 1 tablet by mouth daily.   sertraline (ZOLOFT) 100 MG tablet Take 1.5 tablets (150 mg total) by mouth daily.   spironolactone (ALDACTONE) 25 MG tablet TAKE 1/2 TABLET(12.5 MG) BY MOUTH DAILY   No facility-administered  encounter medications on file as of 09/29/2020.    Patient Active Problem List   Diagnosis Date Noted   Hallux limitus, acquired, right 11/12/2019   Pre-diabetes 10/17/2019   Recurrent major depressive disorder, in partial remission ([place]) 04/16/2019   Morbid obesity ([place]) 04/16/2019   Vasomotor rhinitis 10/25/2018   Hearing loss 08/28/2018   Osteoarthritis 08/28/2018   Tinnitus 08/28/2018   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 ([place]) 07/31/2018   Stroke ([place]) 07/24/2018   TIA (transient ischemic attack)  07/23/2018   Slurred speech 07/23/2018   Right arm weakness 07/23/2018   Hypokalemia 07/23/2018   Anxiety 07/20/2018   Atherosclerosis of native coronary artery of native heart with stable angina pectoris ([place]) 06/16/2018   Hx of CABG 06/16/2018   Mixed hyperlipidemia 06/16/2018   Benign essential HTN 06/16/2018   History of cerebrovascular accident (CVA) with residual deficit 69/62/9528   Systolic CHF ([place]) 41/32/4401   Hypertension 02/15/2018   S/P total knee arthroplasty, right 02/15/2018   Primary osteoarthritis of both knees 11/25/2017   Abnormal nuclear stress test 06/30/2016   Hallux limitus of right foot 10/22/2015   Anesthesia complication 02/72/5366   GERD (gastroesophageal reflux disease) 10/10/2015   Hypothyroidism 10/10/2015   OSA on CPAP 01/10/2007   Diagnosis unknown 01/12/2003    Conditions to be addressed/monitored:CHF, HTN, HLD, and Falls prevention and safety  Care Plan : RNCM: Heart Failure (Adult)  Updates made by Vanita Ingles, RN since 09/29/2020 12:00 AM     Problem: RNCM: Symptom Exacerbation (Heart Failure)   Priority: Medium     Long-Range Goal: RNCM: Symptom Exacerbation Prevented or Minimized   Start Date: 03/13/2020  Expected End Date: 07/23/2021  This Visit's Progress: On track  Recent Progress: On track  Priority: Medium  Note:   Current Barriers:  Knowledge deficits related to basic heart failure pathophysiology and self care management Unable to independently manage HF Lacks social connections Does not contact provider office for questions/concerns Lack of scale in home Financial strain  Nurse Case Manager Clinical Goal(s):   patient will weigh self daily and record  patient will verbalize understanding of Heart Failure Action Plan and when to call doctor  patient will take all Heart Failure mediations as prescribed Interventions:  Collaboration with Parks Ranger Devonne Doughty, DO regarding development and update of comprehensive plan  of care as evidenced by provider attestation and co-signature Inter-disciplinary care team collaboration (see longitudinal plan of care) Basic overview and discussion of pathophysiology of Heart Failure Provided written and verbal education on low sodium diet.  09-29-2020: Verbalized compliance with heart healthy diet. States he is drinking water. He is planning on doing a diet with his daughters boyfriend to lose weight. He will do away with breads and other things. Denies any acute distress.  Reviewed Heart Failure Action Plan in depth and provided written copy. 05-22-2020: The patient is taking Lasix 80 mg BID. States he does not have swelling in his legs and feet today. He does still have the fungus on his toe nails. Denies any recent exacerbations of heart failure. 09-29-2020: Denies edema today. States he is compliant with his medications and dietary restrictions. Saw his cardiologist in June. No changes in the plan of care.  Assessed for scales in home. Has a scales but does not weight daily. 09-29-2020: weight is 246. Wants to lose weight.  Discussed importance of daily weight- 09-29-2020: The patient does not weight daily. States he knows when his weight fluctuates.  States his  HF is well controlled at this time. Encouraged the patient to weigh daily.  Reviewed role of diuretics in prevention of fluid overload. 09-29-2020: Review of taking medications as directed. The patient verbalized compliance today. Will continue to monitor.   Patient Goals/Self-Care Activities patient will:  - Take Heart Failure Medications as prescribed - Weigh daily and record (notify MD with 3 lb weight gain over night or 5 lb in a week) - Follow CHF Action Plan - Adhere to low sodium diet - barriers to lifestyle changes reviewed and addressed - barriers to treatment reviewed and addressed - cognitive screening completed and reviewed - depression screen reviewed - health literacy screening completed or reviewed -  rescue (action) plan reviewed - self-awareness of signs/symptoms of worsening disease encouraged  Follow Up Plan: Telephone follow up appointment with care management team member scheduled for:11-24-2020 at 23 pm    Care Plan : RNCM: Management of HTN  Updates made by Vanita Ingles, RN since 09/29/2020 12:00 AM     Problem: RNCM: Hypertension (Hypertension)   Priority: Medium     Long-Range Goal: RNCM: Hypertension Monitored   Start Date: 03/13/2020  Expected End Date: 07/23/2021  This Visit's Progress: On track  Recent Progress: On track  Priority: Medium  Note:   Objective:  Last practice recorded BP readings:  BP Readings from Last 3 Encounters:  04/16/20 110/65  12/18/19 100/62  10/17/19 118/70   Most recent eGFR/CrCl: No results found for: EGFR  No components found for: CRCL Current Barriers:  Knowledge Deficits related to basic understanding of hypertension pathophysiology and self care management Knowledge Deficits related to understanding of medications prescribed for management of hypertension Unable to independently manage HTN Does not contact provider office for questions/concerns Case Manager Clinical Goal(s):  patient will verbalize understanding of plan for hypertension management patient will attend all scheduled medical appointments: 12-31-2020 at 220 pm patient will demonstrate improved adherence to prescribed treatment plan for hypertension as evidenced by taking all medications as prescribed, monitoring and recording blood pressure as directed, adhering to low sodium/DASH diet patient will demonstrate improved health management independence as evidenced by checking blood pressure as directed and notifying PCP if SBP>160 or DBP > 90, taking all medications as prescribe, and adhering to a low sodium diet as discussed. patient will verbalize basic understanding of hypertension disease process and self health management plan as evidenced by compliance with heart  healthy diet, compliance with medications and working with the CCM team to optimize health and well being.  Interventions:  Collaboration with Olin Hauser, DO regarding development and update of comprehensive plan of care as evidenced by provider attestation and co-signature Inter-disciplinary care team collaboration (see longitudinal plan of care) Evaluation of current treatment plan related to hypertension self management and patient's adherence to plan as established by provider. 09-29-2020: The patient is doing well. Denies any new issues with HTN. States that he is going on a diet because he needs to lose some weight. Feels that this will really help him. Education and support given.  Provided education to patient re: stroke prevention, s/s of heart attack and stroke, DASH diet, complications of uncontrolled blood pressure Reviewed medications with patient and discussed importance of compliance. 09-29-2020: States compliance with medications. Denies any needs related to his HTN medications.  Discussed plans with patient for ongoing care management follow up and provided patient with direct contact information for care management team Advised patient, providing education and rationale, to monitor blood pressure daily and record,  calling PCP for findings outside established parameters.  Reviewed scheduled/upcoming provider appointments including: 12-31-2020 at 220 pm Self-Care Activities: - Self administers medications as prescribed Attends all scheduled provider appointments Calls provider office for new concerns, questions, or BP outside discussed parameters Checks BP and records as discussed Follows a low sodium diet/DASH diet Patient Goals: - check blood pressure weekly - choose a place to take my blood pressure (home, clinic or office, retail store) - write blood pressure results in a log or diary - agree on reward when goals are met - agree to work together to make changes -  ask questions to understand - have a family meeting to talk about healthy habits - learn about high blood pressure  Follow Up Plan: Telephone follow up appointment with care management team member scheduled for:11-14--2022 at 230 pm    Care Plan : RNCM: Management of HLD  Updates made by Vanita Ingles, RN since 09/29/2020 12:00 AM     Problem: RNCM: [place] Promotion or Disease Self-Management (General Plan of Care)   Priority: Medium     Long-Range Goal: RNCM;HLD Self-Management Plan Developed   Start Date: 03/13/2020  Expected End Date: 07/23/2021  This Visit's Progress: On track  Recent Progress: On track  Priority: Medium  Note:   Current Barriers:  Poorly controlled hyperlipidemia, complicated by OSA,HTN, CAD, HLD, CVA Current antihyperlipidemic regimen: Atorvastatin 80 mg QD Most recent lipid panel:     Component Value Date/Time   CHOL 127 10/12/2019 0810   TRIG 123 10/12/2019 0810   HDL 41 10/12/2019 0810   CHOLHDL 3.1 10/12/2019 0810   VLDL 25 07/24/2018 0458   LDLCALC 66 10/12/2019 0810   ASCVD risk enhancing conditions: age >46, HTN, CKD, CHF Unable to independently HLD Does not contact provider office for questions/concerns RN Care Manager Clinical Goal(s):  patient will work with Consulting civil engineer, providers, and care team towards execution of optimized self-health management plan patient will verbalize understanding of plan for effective management of HLD patient will work with [facility] and pcp  to address needs related to management of HLD  patient will demonstrate a decrease in HLD  exacerbations patient will take all medications exactly as prescribed and will call provider for medication related questions patient will verbalize basic understanding of HLD disease process and self health management plan patient will demonstrate understanding of rationale for each prescribed medication the patient will demonstrate ongoing self health care management  ability Interventions: Collaboration with Olin Hauser, DO regarding development and update of comprehensive plan of care as evidenced by provider attestation and co-signature Inter-disciplinary care team collaboration (see longitudinal plan of care) Medication review performed; medication list updated in electronic medical record.  Inter-disciplinary care team collaboration (see longitudinal plan of care) Referred to pharmacy team for assistance with HLD medication management Evaluation of current treatment plan related to HLD  and patient's adherence to plan as established by provider. 09-29-2020: The patient is doing well with management of HLD and heart health. Saw specialist recently and no changes in the plan of care.  Will have blood work on 11-07-2020. The patient states he is doing well and is happy that he is exercising more. Will continue to monitor.  Advised patient to call the office for changes in condition, questions, or concerns Reviewed medications with patient and discussed compliance Reviewed scheduled/upcoming provider appointments including: 12-31-2020 at 220 pm Discussed plans with patient for ongoing care management follow up and provided patient with direct contact information for  care management team Patient Goals/Self-Care Activities: - call for medicine refill 2 or 3 days before it runs out - call if I am sick and can't take my medicine - keep a list of all the medicines I take; vitamins and herbals too - learn to read medicine labels - use a pillbox to sort medicine - use an alarm clock or phone to remind me to take my medicine - change to whole grain breads, cereal, pasta - drink 6 to 8 glasses of water each day - eat 3 to 5 servings of fruits and vegetables each day - eat 5 or 6 small meals each day - eat fish at least once per week - fill half the plate with nonstarchy vegetables - limit fast food meals to no more than 1 per week - manage portion  size - prepare main meal at home 3 to 5 days each week - read food labels for fat, fiber, carbohydrates and portion size - be open to making changes - I can manage, know and watch for signs of a heart attack - if I have chest pain, call for help - learn about small changes that will make a big difference - learn my personal risk factors  Follow Up Plan: Telephone follow up appointment with care management team member scheduled for: 11-24-2020 230 pm      Care Plan : RNCM: Fall Risk (Adult)  Updates made by Vanita Ingles, RN since 09/29/2020 12:00 AM     Problem: RNCM: Fall Risk   Priority: High     Long-Range Goal: RNCM: Absence of Fall and Fall-Related Injury   Start Date: 02/07/2020  Expected End Date: 07/23/2021  This Visit's Progress: On track  Recent Progress: Not on track  Priority: High  Note:   Current Barriers:  Knowledge Deficits related to fall precautions in patient with multiple chronic conditions Decreased adherence to prescribed treatment for fall prevention Lacks social connections Does not contact provider office for questions/concerns Does not call office and report new falls when they occur Knowledge Deficits related to how to obtain handicap sticker to use with his car. 05-22-2020: Has handicap sticker for his care and states it is very helpful Chronic Disease Management support and education needs related to falls prevention and safety in patient with frequent falls due to unsteady gain and impaired mobility Lacks caregiver support.  Clinical Goal(s):  patient will demonstrate improved adherence to prescribed treatment plan for decreasing falls as evidenced by patient reporting and review of EMR patient will verbalize using fall risk reduction strategies discussed patient will not experience additional falls patient will verbalize understanding of plan for fall prevention and safety in the home patient will work with pcp, [facility] staff to address needs related  to getting paperwork for handicap sticker for the patients care- 05-22-2020: Completed and done.  patient will attend all scheduled medical appointments: No upcoming appointments. Knows to call the office for changes Interventions:  Collaboration with Olin Hauser, DO regarding development and update of comprehensive plan of care as evidenced by provider attestation and co-signature Inter-disciplinary care team collaboration (see longitudinal plan of care) Provided written and verbal education re: Potential causes of falls and Fall prevention strategies Reviewed medications and discussed potential side effects of medications such as dizziness and frequent urination. 05-22-2020: Denies any issues at this time with medications, dizziness. Does take Lasix that causes frequent urination. Review and education done. 07-28-2020: Denies any issues with medications causing dizziness, light headedness or issues related  to his fall incidences. Will continue to monitor. 09-29-2020: Denies any falls at this time. The patient states that he is doing well. Is doing more exercises and feels this is helping him with his strength and ability to walk better. Will continue to monitor.  Assessed for s/s of orthostatic hypotension. 09-29-2020: The patient denies any issues with blood pressure drops Assessed for falls since last encounter. 05-22-2020: The patient has had falls since March and had 2 "near falls" yesterday. States that the reason for falls is one of his toes is growing into the other toe and it is causing him to loose his balance. The patient states that he needs to be seen by a podiatrist. Is considering going to [facility] for this care, but did ask for recommendations by Dr. Parks Ranger. Will collaborate with pcp for recommendations on podiatrist. 07-28-2020: The patient states he had a fall 3 or 4 days ago when he was carrying a pot of soup out to the car and he looked away at his dog and then mis-stepped. He hit  his knees but saved the pot of soup.  The patient states that he did not hit his head or fracture anything. Has places on bilateral knees and a bruise to his hip where the key fob was. He denies any acute distress. Reviewed with the patient safety and monitoring for changes in his environment. Will continue to monitor for changes. 09-29-2020: No new falls at this time. Will continue to monitor.  Assessed patients knowledge of fall risk prevention secondary to previously provided education. 07-28-2020: Reviewed today wit the patient. Discussed the concern of increase risk of fractures or head injuries with frequent falls. The patient states he is trying to be safe. He does not use a cane or a walker. Encouraged the patient to consider using DME when ambutlating. 09-29-2020: The patient states he is doing better and denies any new concerns. The patient states that he is working out more and feels safe.  Assessed working status of life alert bracelet and patient adherence Provided patient information for fall alert systems Evaluation of current treatment plan related to falls prevention and safety  and patient's adherence to plan as established by provider. 07-28-2020: Education and support given on fall prevention and safety concerns as the patient has an impaired toe that is causing him to have an unsteady gait. Will collaborate with pcp for recommendations. Review of being safe in his environment. 09-29-2020: No new changes in condition. Will continue to monitor.  Advised patient to call the office for new falls, seek emergent help if needed for falls with injury and work with [facility] for fall prevention and safety  Provided education to patient re: being safe and monitoring for potential high fall risk areas, using DME to help with falls, reporting falls.  Reviewed scheduled/upcoming provider appointments including: No upcoming appointments noted. Knows to call for changes, encouraged patient to call to get an  appointment with podiatrist. The patient does not wish to return to previous podiatrist.  Discussed plans with patient for ongoing care management follow up and provided patient with direct contact information for care management team Self-Care Deficits:  Unable to independently manage falls as evidence of fall last week when leaving a restaurant. 05-22-2020: Has had reoccurrence of falls since last outreach in March. 9-19-202: No new falls at this time. Will continue to monitor  Lacks social connections Does not contact provider office for questions/concerns Patient Goals:  - Utilize cane (assistive device) appropriately with all  ambulation- states he has misplaced his cane but is actively looking for it. 09-29-2020: Review of use of DME when ambulating. The patient denies using a cane or a walker. Will consider.  - De-clutter walkways - Change positions slowly - Wear secure fitting shoes at all times with ambulation - Utilize home lighting for dim lit areas - Demonstrate self and pet awareness at all times - activities of daily living skills assessed - assistive or adaptive device use encouraged - barriers to physical activity or exercise addressed - barriers to physical activity or exercise identified - barriers to safety identified - cognition assessed - cognitive-stimulating activities promoted - fall prevention plan reviewed and updated - fear of falling, loss of independence and pain acknowledged - medication list reviewed - modification of home and work environment promoted Follow Up Plan: Telephone follow up appointment with care management team member scheduled for: 11-24-2020 at 230 pm     Plan:Telephone follow up appointment with care management team member scheduled for:  11-24-2020  at 230 pm [place], MSN, [place] Mobile: [phone]

## 2020-10-03 DIAGNOSIS — G4733 Obstructive sleep apnea (adult) (pediatric): Secondary | ICD-10-CM | POA: Diagnosis not present

## 2020-10-08 ENCOUNTER — Emergency Department: Payer: Medicare PPO

## 2020-10-08 ENCOUNTER — Ambulatory Visit: Payer: Self-pay | Admitting: *Deleted

## 2020-10-08 ENCOUNTER — Other Ambulatory Visit: Payer: Self-pay

## 2020-10-08 ENCOUNTER — Emergency Department
Admission: EM | Admit: 2020-10-08 | Discharge: 2020-10-08 | Disposition: A | Discharge status: Home / Self Care | Payer: Medicare PPO | Attending: Emergency Medicine | Admitting: Emergency Medicine

## 2020-10-08 DIAGNOSIS — M4802 Spinal stenosis, cervical region: Secondary | ICD-10-CM | POA: Diagnosis not present

## 2020-10-08 DIAGNOSIS — E039 Hypothyroidism, unspecified: Secondary | ICD-10-CM | POA: Diagnosis not present

## 2020-10-08 DIAGNOSIS — Z951 Presence of aortocoronary bypass graft: Secondary | ICD-10-CM | POA: Diagnosis not present

## 2020-10-08 DIAGNOSIS — I25118 Atherosclerotic heart disease of native coronary artery with other forms of angina pectoris: Secondary | ICD-10-CM | POA: Diagnosis not present

## 2020-10-08 DIAGNOSIS — Z79899 Other long term (current) drug therapy: Secondary | ICD-10-CM | POA: Diagnosis not present

## 2020-10-08 DIAGNOSIS — S0181XA Laceration without foreign body of other part of head, initial encounter: Secondary | ICD-10-CM

## 2020-10-08 DIAGNOSIS — Z043 Encounter for examination and observation following other accident: Secondary | ICD-10-CM | POA: Diagnosis not present

## 2020-10-08 DIAGNOSIS — N189 Chronic kidney disease, unspecified: Secondary | ICD-10-CM | POA: Diagnosis not present

## 2020-10-08 DIAGNOSIS — I509 Heart failure, unspecified: Secondary | ICD-10-CM | POA: Insufficient documentation

## 2020-10-08 DIAGNOSIS — Z96651 Presence of right artificial knee joint: Secondary | ICD-10-CM | POA: Insufficient documentation

## 2020-10-08 DIAGNOSIS — Z7982 Long term (current) use of aspirin: Secondary | ICD-10-CM | POA: Diagnosis not present

## 2020-10-08 DIAGNOSIS — Z23 Encounter for immunization: Secondary | ICD-10-CM | POA: Insufficient documentation

## 2020-10-08 DIAGNOSIS — R519 Headache, unspecified: Secondary | ICD-10-CM | POA: Diagnosis not present

## 2020-10-08 DIAGNOSIS — S0990XA Unspecified injury of head, initial encounter: Secondary | ICD-10-CM

## 2020-10-08 DIAGNOSIS — S40012A Contusion of left shoulder, initial encounter: Secondary | ICD-10-CM

## 2020-10-08 DIAGNOSIS — S0993XA Unspecified injury of face, initial encounter: Secondary | ICD-10-CM | POA: Diagnosis not present

## 2020-10-08 DIAGNOSIS — M47812 Spondylosis without myelopathy or radiculopathy, cervical region: Secondary | ICD-10-CM | POA: Diagnosis not present

## 2020-10-08 DIAGNOSIS — W01198A Fall on same level from slipping, tripping and stumbling with subsequent striking against other object, initial encounter: Secondary | ICD-10-CM | POA: Diagnosis not present

## 2020-10-08 DIAGNOSIS — M4312 Spondylolisthesis, cervical region: Secondary | ICD-10-CM | POA: Diagnosis not present

## 2020-10-08 DIAGNOSIS — I130 Hypertensive heart and chronic kidney disease with heart failure and stage 1 through stage 4 chronic kidney disease, or unspecified chronic kidney disease: Secondary | ICD-10-CM | POA: Diagnosis not present

## 2020-10-08 DIAGNOSIS — Z7902 Long term (current) use of antithrombotics/antiplatelets: Secondary | ICD-10-CM | POA: Insufficient documentation

## 2020-10-08 DIAGNOSIS — M25512 Pain in left shoulder: Secondary | ICD-10-CM | POA: Diagnosis not present

## 2020-10-08 MED ORDER — TETANUS-DIPHTH-ACELL PERTUSSIS 5-2.5-18.5 LF-MCG/0.5 IM SUSY
0.5000 mL | PREFILLED_SYRINGE | Freq: Once | INTRAMUSCULAR | Status: AC
  Administered 2020-10-08: 0.5 mL via INTRAMUSCULAR

## 2020-10-08 NOTE — ED Provider Notes (Signed)
[facility] Emergency Department Provider Note ____________________________________________   Event Date/Time   First MD Initiated Contact with Patient 10/08/20 1418     (approximate)  I have reviewed the triage vital signs and the nursing notes.   HISTORY  Chief Complaint Fall    HPI Luke Mckee is a 80 y.o. male with PMH as noted below (with CAD on aspirin and Plavix) who presents with head injury occurring during a fall from standing height yesterday.  The patient states that he was going to turn off a hose and believes he tripped on some brush.  He fell forward and hit the left side of his head and his left shoulder on a stone wall.  The patient did not lose consciousness.  He denies any neck or back pain or any other injuries.  His wife, who is a retired [employer] fundraiser, applied a Tegaderm and bandage to a small laceration on the left temple area just lateral to the eye.  Past Medical History:  Diagnosis Date   Anxiety    Arthritis    CAD (coronary artery disease) 2005   s/p CABG, DES   CHF (congestive heart failure) (HCC)    in EPIC care everywhere   Chronic kidney disease    COPD (chronic obstructive pulmonary disease) (HCC)    in EPIC care everywhere   Depression    Heart murmur    History of stroke    Hypothyroidism    OSA (obstructive sleep apnea)    Paroxysmal A-fib (HCC)    in EPIC careeverywhere    Patient Active Problem List   Diagnosis Date Noted   Hallux limitus, acquired, right 11/12/2019   Pre-diabetes 10/17/2019   Recurrent major depressive disorder, in partial remission (HCC) 04/16/2019   Morbid obesity (HCC) 04/16/2019   Vasomotor rhinitis 10/25/2018   Hearing loss 08/28/2018   Osteoarthritis 08/28/2018   Tinnitus 08/28/2018   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 (HCC) 07/31/2018   Stroke (HCC) 07/24/2018   TIA (transient ischemic attack) 07/23/2018   Slurred speech 07/23/2018   Right arm weakness  07/23/2018   Hypokalemia 07/23/2018   Anxiety 07/20/2018   Atherosclerosis of native coronary artery of native heart with stable angina pectoris (HCC) 06/16/2018   Hx of CABG 06/16/2018   Mixed hyperlipidemia 06/16/2018   Benign essential HTN 06/16/2018   History of cerebrovascular accident (CVA) with residual deficit 06/07/2018   Systolic CHF (HCC) 02/15/2018   Hypertension 02/15/2018   S/P total knee arthroplasty, right 02/15/2018   Primary osteoarthritis of both knees 11/25/2017   Abnormal nuclear stress test 06/30/2016   Hallux limitus of right foot 10/22/2015   Anesthesia complication 10/10/2015   GERD (gastroesophageal reflux disease) 10/10/2015   Hypothyroidism 10/10/2015   OSA on CPAP 01/10/2007   Diagnosis unknown 01/12/2003    Past Surgical History:  Procedure Laterality Date   APPENDECTOMY     BUNIONECTOMY     CATARACT EXTRACTION     Right eye   COLONOSCOPY  07/06/2004   CORONARY ARTERY BYPASS GRAFT  2005   LOOP RECORDER INSERTION N/A 07/25/2018   Procedure: LOOP RECORDER INSERTION;  Surgeon: Regan Lemming, MD;  Location: [facility] INVASIVE CV LAB;  Service: Cardiovascular;  Laterality: N/A;   TOTAL HIP ARTHROPLASTY Right 11/23/2011   TOTAL HIP ARTHROPLASTY Left 09/07/2011   TOTAL KNEE ARTHROPLASTY Right 02/15/2018   VASECTOMY  1978    Prior to Admission medications   Medication Sig Start Date End Date Taking? Authorizing  Provider  acetaminophen (TYLENOL) 650 MG CR tablet Take 1,300 mg by mouth at bedtime as needed.    [provider]  aspirin 81 MG chewable tablet Chew 81 mg by mouth daily.    [provider]  atenolol (TENORMIN) 50 MG tablet Take 1 tablet (50 mg total) by mouth at bedtime. 06/30/20   Karamalegos, Netta [name], DO  atorvastatin (LIPITOR) 80 MG tablet Take 1 tablet (80 mg total) by mouth daily. 06/30/20   Karamalegos, Netta [name], DO  Cholecalciferol (VITAMIN D) 50 MCG (2000 UT) tablet Take 4,000 Units by mouth daily.    [provider]  clopidogrel (PLAVIX) 75 MG tablet TAKE 1 TABLET(75 MG) BY MOUTH DAILY 04/03/20   [name], MD  clotrimazole-betamethasone (LOTRISONE) cream Apply twice a day for 1-2 weeks, may repeat if need in future 06/30/20   [name], DO  co-enzyme Q-10 30 MG capsule Take 100 mg by mouth daily.    [provider]  diclofenac sodium (VOLTAREN) 1 % GEL Apply 2 g topically 4 (four) times daily as needed for pain. Foot pain 01/16/18   [provider]  docusate sodium (COLACE) 100 MG capsule Take 200 mg by mouth daily. 02/16/18   [provider]  furosemide (LASIX) 80 MG tablet Take 1 tablet (80 mg total) by mouth 2 (two) times daily. 06/30/20   Karamalegos, Netta [name], DO  isosorbide mononitrate (IMDUR) 30 MG 24 hr tablet Take 1 tablet (30 mg total) by mouth daily. 06/30/20   [name], Netta [name], DO  levothyroxine (SYNTHROID) 125 MCG tablet Take 1 tablet (125 mcg total) by mouth daily before breakfast. 06/30/20   [name], Netta [name], DO  loratadine (CLARITIN) 10 MG tablet Take 10 mg by mouth daily.    [provider]  LORazepam (ATIVAN) 0.5 MG tablet Take 1 tablet (0.5 mg total) by mouth daily as needed for anxiety or sleep. 06/07/18   Karamalegos, Netta [name], DO  montelukast (SINGULAIR) 10 MG tablet Take 1 tablet (10 mg total) by mouth at bedtime. 04/16/20   [name], DO  Multiple Vitamin (MULTIVITAMIN) tablet Take 1 tablet by mouth daily.    [provider]  nitroGLYCERIN (NITROSTAT) 0.4 MG SL tablet Place 1 tablet (0.4 mg total) under the tongue every 5 (five) minutes as needed for chest pain. 04/16/19   Karamalegos, Alexander J, DO  PATADAY 0.1 % ophthalmic solution Place into both eyes as needed.  11/20/18   [provider]  senna (SENOKOT) 8.6 MG tablet Take 1 tablet by mouth daily.    [provider]  sertraline (ZOLOFT) 100 MG tablet Take 1.5 tablets (150 mg total) by mouth daily. 06/30/20    Karamalegos, Netta [name], DO  spironolactone (ALDACTONE) 25 MG tablet TAKE 1/2 TABLET(12.5 MG) BY MOUTH DAILY 08/29/20   [name], Netta [name], DO    Allergies Ace inhibitors, Cephalexin, Crestor [rosuvastatin calcium], and Tape  Family History  Problem Relation Age of Onset   Anxiety disorder Sister     Social History Social History   Tobacco Use   Smoking status: Never   Smokeless tobacco: Never  Vaping Use   Vaping Use: Never used  Substance Use Topics   Alcohol use: Not Currently    Comment: past   Drug use: Never    Review of Systems  Constitutional: No fever/chills Eyes: No visual changes. ENT: No sore throat. Cardiovascular: Denies chest pain. Respiratory: Denies shortness of breath. Gastrointestinal: No nausea, no vomiting.  No diarrhea.  Genitourinary: Negative for dysuria.  Musculoskeletal: Negative for neck or back pain. Skin: Negative for rash. Neurological: Negative for headaches, focal weakness or numbness.   ____________________________________________   PHYSICAL EXAM:  VITAL SIGNS: ED Triage Vitals  Enc Vitals Group     BP 10/08/20 1205 108/65     Pulse Rate 10/08/20 1205 68     Resp 10/08/20 1205 17     Temp 10/08/20 1205 98 F (36.7 C)     Temp src --      SpO2 10/08/20 1205 96 %     Weight 10/08/20 1410 238 lb 1.6 oz (108 kg)     Height 10/08/20 1410 5\' 8"  (1.727 m)     Head Circumference --      Peak Flow --      Pain Score 10/08/20 1202 2     Pain Loc --      Pain Edu? --      Excl. in GC? --     Constitutional: Alert and oriented. Well appearing and in no acute distress. Eyes: Conjunctivae are normal.  EOMI.  PERRLA. Head: Left periorbital ecchymosis and swelling.  3 cm superficial laceration just lateral to the left eye which is healing. Nose: No congestion/rhinnorhea. Mouth/Throat: Mucous membranes are moist.   Neck: Normal range of motion.  No midline cervical spinal tenderness. Cardiovascular: Normal rate, regular  rhythm.   Good peripheral circulation. Respiratory: Normal respiratory effort.  No retractions. Gastrointestinal: No distention.  Musculoskeletal: No lower extremity edema.  Extremities warm and well perfused.  No midline spinal tenderness.  Left shoulder and proximal upper arm with ecchymosis.  Full range of motion.  2+ radial pulse. Neurologic:  Normal speech and language.  5/5 motor strength and intact sensation to bilateral upper and lower extremities.  No pronator drift.  No ataxia on finger-to-nose. Skin:  Skin is warm and dry. No rash noted. Psychiatric: Mood and affect are normal. Speech and behavior are normal.  ____________________________________________   LABS (all labs ordered are listed, but only abnormal results are displayed)  Labs Reviewed - No data to display ____________________________________________  EKG   ____________________________________________  RADIOLOGY  XR shoulder: No acute fracture CT head/maxillofacial/cervical spine: IMPRESSION:  1. No acute intracranial pathology.  2.  No acute osseous injury of the maxillofacial bones.  3.  No acute osseous injury of the cervical spine.  4. Cervical spine spondylosis as described above.    ____________________________________________   PROCEDURES  Procedure(s) performed: Yes  .09/30/22Laceration Repair  Date/Time: 10/08/2020 2:58 PM Performed by: 10/10/2020, MD Authorized by: Dionne Bucy, MD   Consent:    Consent obtained:  Verbal   Consent given by:  Patient   Risks, benefits, and alternatives were discussed: yes   Universal protocol:    Patient identity confirmed:  Verbally with patient Anesthesia:    Anesthesia method:  None Laceration details:    Location:  Face   Length (cm):  3 Treatment:    Amount of cleaning:  Standard Skin repair:    Repair method:  Tissue adhesive Approximation:    Approximation:  Close Repair type:    Repair type:  Simple Post-procedure details:     Dressing:  Open (no dressing)   Procedure completion:  Tolerated well, no immediate complications  Critical Care performed: No ____________________________________________   INITIAL IMPRESSION / ASSESSMENT AND PLAN / ED COURSE  Pertinent labs & imaging results that were available during my care of the patient were reviewed by me and considered in  my medical decision making (see chart for details).   80 year old male with PMH as noted above including CAD on aspirin and Plavix presents with a head and left shoulder injury after a mechanical fall from standing height yesterday evening.  On exam the patient is well-appearing and his vital signs are normal.  Neurologic exam is normal.  He has some left periorbital swelling and ecchymosis and has a superficial laceration just lateral to the left eye which his wife approximated yesterday with a Tegaderm and a bandage.  There is also some ecchymosis to the left shoulder but the patient is able to range it well.  CT head, maxillofacial, and cervical spine were obtained from triage and are negative for acute traumatic findings.  X-rays of the left shoulder also negative for acute findings.  There is evidence of a chronic rotator cuff tear although the direct mechanism of the patient's injury last night is consistent with a contusion.  The left facial laceration is already healing well and is fully approximated.  I cannot pull it apart.  However I applied Dermabond to maintain approximation so that the patient did not have to have a large Tegaderm over it.  There is no indication for sutures.  At this time, the patient is stable for discharge home.  I counseled him and his wife on the results of the work-up.  I recommend he follows up with orthopedics as needed for the shoulder and with his primary care doctor.  Return precautions given, and they expressed understanding. ____________________________________________   FINAL CLINICAL IMPRESSION(S) / ED  DIAGNOSES  Final diagnoses:  Facial laceration, initial encounter  Injury of head, initial encounter  Contusion of left shoulder, initial encounter      NEW MEDICATIONS STARTED DURING THIS VISIT:  New Prescriptions   No medications on file     Note:  This document was prepared using Dragon voice recognition software and may include unintentional dictation errors.    Dionne Bucy, MD 10/08/20 1506

## 2020-10-08 NOTE — ED Provider Notes (Signed)
Emergency Medicine Provider Triage Evaluation Note  Luke Mckee , a 80 y.o. male  was evaluated in triage.  Pt complains of fall and head injury from yesterday.  Is on Plavix.  Review of Systems  Positive: Head injury, left shoulder pain Negative: No LOC, no chest pain, shortness of breath, abdominal pain,  Physical Exam  There were no vitals taken for this visit. Gen:   Awake, no distress   Resp:  Normal effort  MSK:   Moves extremities without difficulty, left shoulder is tender to palpation Other:  Old lacerations noted to the face large amount of bruising noted, lump on left side of the skull  Medical Decision Making  Medically screening exam initiated at 12:04 PM.  Appropriate orders placed.  Luke Mckee was informed that the remainder of the evaluation will be completed by another provider, this initial triage assessment does not replace that evaluation, and the importance of remaining in the ED until their evaluation is complete.     Faythe Ghee, PA-C 10/08/20 1205    Chesley Noon, MD 10/08/20 [phone]
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
## 2020-10-08 NOTE — Discharge Instructions (Addendum)
Return to the ER for new, worsening, or persistent severe headache, vision changes, bleeding, redness or swelling around the wound, fever, or any other new or worsening symptoms that concern you.

## 2020-10-08 NOTE — ED Triage Notes (Signed)
Pt comes with c/o mechanical trip and fall. Pt states he tripped over some brush. Pt states he hit his head on stone wall. Pt states headache and right arm shoulder pain. Pt is on blood thinners. Pt has bruising and swelling to right eye. Bleeding controlled.

## 2020-10-10 DIAGNOSIS — I5022 Chronic systolic (congestive) heart failure: Secondary | ICD-10-CM | POA: Diagnosis not present

## 2020-10-10 DIAGNOSIS — I10 Essential (primary) hypertension: Secondary | ICD-10-CM

## 2020-10-10 DIAGNOSIS — E782 Mixed hyperlipidemia: Secondary | ICD-10-CM | POA: Diagnosis not present

## 2020-10-13 ENCOUNTER — Ambulatory Visit (INDEPENDENT_AMBULATORY_CARE_PROVIDER_SITE_OTHER): Payer: Medicare PPO

## 2020-10-13 DIAGNOSIS — I63412 Cerebral infarction due to embolism of left middle cerebral artery: Secondary | ICD-10-CM | POA: Diagnosis not present

## 2020-10-13 LAB — CUP PACEART REMOTE DEVICE CHECK
Date Time Interrogation Session: 20220923235423
Implantable Pulse Generator Implant Date: 20200714

## 2020-10-16 ENCOUNTER — Ambulatory Visit (INDEPENDENT_AMBULATORY_CARE_PROVIDER_SITE_OTHER): Payer: Medicare PPO

## 2020-10-16 ENCOUNTER — Encounter: Payer: Self-pay | Admitting: Family Medicine

## 2020-10-16 ENCOUNTER — Other Ambulatory Visit: Payer: Self-pay

## 2020-10-16 ENCOUNTER — Ambulatory Visit (INDEPENDENT_AMBULATORY_CARE_PROVIDER_SITE_OTHER): Payer: Medicare PPO | Admitting: Family Medicine

## 2020-10-16 VITALS — BP 129/62 | HR 65 | Ht 68.0 in | Wt 246.8 lb

## 2020-10-16 DIAGNOSIS — I25810 Atherosclerosis of coronary artery bypass graft(s) without angina pectoris: Secondary | ICD-10-CM | POA: Diagnosis not present

## 2020-10-16 DIAGNOSIS — I10 Essential (primary) hypertension: Secondary | ICD-10-CM | POA: Diagnosis not present

## 2020-10-16 DIAGNOSIS — F419 Anxiety disorder, unspecified: Secondary | ICD-10-CM

## 2020-10-16 DIAGNOSIS — Z23 Encounter for immunization: Secondary | ICD-10-CM

## 2020-10-16 DIAGNOSIS — R296 Repeated falls: Secondary | ICD-10-CM

## 2020-10-16 DIAGNOSIS — E782 Mixed hyperlipidemia: Secondary | ICD-10-CM

## 2020-10-16 MED ORDER — SERTRALINE HCL 100 MG PO TABS: 200.0000 mg | ORAL_TABLET | Freq: Every day | ORAL | 3 refills | Status: DC

## 2020-10-16 MED ORDER — SPIRONOLACTONE 25 MG PO TABS: 12.5000 mg | ORAL_TABLET | Freq: Every day | ORAL | 3 refills | Status: DC

## 2020-10-16 MED ORDER — NITROGLYCERIN 0.4 MG SL SUBL: 0.4000 mg | SUBLINGUAL_TABLET | SUBLINGUAL | 2 refills | Status: DC | PRN

## 2020-10-16 MED ORDER — LORAZEPAM 0.5 MG PO TABS: 0.5000 mg | ORAL_TABLET | Freq: Every day | ORAL | 1 refills | Status: DC | PRN

## 2020-10-16 NOTE — Patient Instructions (Addendum)
Thank you for coming to the office today.  Refilled medications.  Flu Shot today  Recommend updated Oceanographer at pharmacy.  No pneumonia vaccine required.  Please schedule a Follow-up Appointment to: Return if symptoms worsen or fail to improve, for keep scheduled apt.  If you have any other questions or concerns, please feel free to call the office or send a message through MyChart. You may also schedule an earlier appointment if necessary.  Additionally, you may be receiving a survey about your experience at our office within a few days to 1 week by e-mail or mail. We value your feedback.  Saralyn Pilar, DO [facility], [place]

## 2020-10-16 NOTE — Chronic Care Management (AMB) (Signed)
Chronic Care Management   CCM RN Visit Note  10/16/2020 Name: Luke Mckee MRN: [id number] DOB: 04-07-1940  Subjective: Luke Mckee is a 80 y.o. year old male who is a primary care patient of [name], DO. The care management team was consulted for assistance with disease management and care coordination needs.    Engaged with patient by telephone for follow up visit in response to provider referral for case management and/or care coordination services.   Consent to Services:  The patient was given information about Chronic Care Management services, agreed to services, and gave verbal consent prior to initiation of services.  Please see initial visit note for detailed documentation.   Patient agreed to services and verbal consent obtained.   Assessment: Review of patient past medical history, allergies, medications, health status, including review of consultants reports, laboratory and other test data, was performed as part of comprehensive evaluation and provision of chronic care management services.   SDOH (Social Determinants of Health) assessments and interventions performed:    CCM Care Plan  Allergies  Allergen Reactions   Ace Inhibitors    Cephalexin     Vomiting and diarrhea   Crestor [Rosuvastatin Calcium] Other (See Comments)    myalgia   Tape Rash    blistering    Outpatient Encounter Medications as of 10/16/2020  Medication Sig   acetaminophen (TYLENOL) 650 MG CR tablet Take 1,300 mg by mouth at bedtime as needed.   aspirin 81 MG chewable tablet Chew 81 mg by mouth daily.   atenolol (TENORMIN) 50 MG tablet Take 1 tablet (50 mg total) by mouth at bedtime.   atorvastatin (LIPITOR) 80 MG tablet Take 1 tablet (80 mg total) by mouth daily.   Cholecalciferol (VITAMIN D) 50 MCG (2000 UT) tablet Take 4,000 Units by mouth daily.   clopidogrel (PLAVIX) 75 MG tablet TAKE 1 TABLET(75 MG) BY MOUTH DAILY   clotrimazole-betamethasone (LOTRISONE) cream Apply twice  a day for 1-2 weeks, may repeat if need in future   co-enzyme Q-10 30 MG capsule Take 100 mg by mouth daily.   diclofenac sodium (VOLTAREN) 1 % GEL Apply 2 g topically 4 (four) times daily as needed for pain. Foot pain   docusate sodium (COLACE) 100 MG capsule Take 200 mg by mouth daily.   furosemide (LASIX) 80 MG tablet Take 1 tablet (80 mg total) by mouth 2 (two) times daily.   isosorbide mononitrate (IMDUR) 30 MG 24 hr tablet Take 1 tablet (30 mg total) by mouth daily.   levothyroxine (SYNTHROID) 125 MCG tablet Take 1 tablet (125 mcg total) by mouth daily before breakfast.   loratadine (CLARITIN) 10 MG tablet Take 10 mg by mouth daily.   LORazepam (ATIVAN) 0.5 MG tablet Take 1 tablet (0.5 mg total) by mouth daily as needed for anxiety or sleep.   montelukast (SINGULAIR) 10 MG tablet Take 1 tablet (10 mg total) by mouth at bedtime.   Multiple Vitamin (MULTIVITAMIN) tablet Take 1 tablet by mouth daily.   nitroGLYCERIN (NITROSTAT) 0.4 MG SL tablet Place 1 tablet (0.4 mg total) under the tongue every 5 (five) minutes as needed for chest pain.   PATADAY 0.1 % ophthalmic solution Place into both eyes as needed.    senna (SENOKOT) 8.6 MG tablet Take 1 tablet by mouth daily.   sertraline (ZOLOFT) 100 MG tablet Take 2 tablets (200 mg total) by mouth daily.   spironolactone (ALDACTONE) 25 MG tablet Take 0.5 tablets (12.5 mg total) by mouth daily.  No facility-administered encounter medications on file as of 10/16/2020.    Patient Active Problem List   Diagnosis Date Noted   Hallux limitus, acquired, right 11/12/2019   Pre-diabetes 10/17/2019   Recurrent major depressive disorder, in partial remission ([place]) 04/16/2019   Morbid obesity ([place]) 04/16/2019   Vasomotor rhinitis 10/25/2018   Hearing loss 08/28/2018   Osteoarthritis 08/28/2018   Tinnitus 08/28/2018   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 ([place]) 07/31/2018   Stroke ([place]) 07/24/2018   TIA (transient ischemic  attack) 07/23/2018   Slurred speech 07/23/2018   Right arm weakness 07/23/2018   Hypokalemia 07/23/2018   Anxiety 07/20/2018   Atherosclerosis of native coronary artery of native heart with stable angina pectoris ([place]) 06/16/2018   Hx of CABG 06/16/2018   Mixed hyperlipidemia 06/16/2018   Benign essential HTN 06/16/2018   History of cerebrovascular accident (CVA) with residual deficit 84/16/6063   Systolic CHF ([place]) 01/60/1093   Hypertension 02/15/2018   S/P total knee arthroplasty, right 02/15/2018   Primary osteoarthritis of both knees 11/25/2017   Abnormal nuclear stress test 06/30/2016   Hallux limitus of right foot 10/22/2015   Anesthesia complication 23/55/7322   GERD (gastroesophageal reflux disease) 10/10/2015   Hypothyroidism 10/10/2015   OSA on CPAP 01/10/2007   Diagnosis unknown 01/12/2003    Conditions to be addressed/monitored:HTN, HLD, and falls prevention and safety  Care Plan : RNCM: Management of HTN  Updates made by Vanita Ingles, RN since 10/16/2020 12:00 AM     Problem: RNCM: Hypertension (Hypertension)   Priority: Medium     Long-Range Goal: RNCM: Hypertension Monitored   Start Date: 03/13/2020  Expected End Date: 07/23/2021  This Visit's Progress: On track  Recent Progress: On track  Priority: Medium  Note:   Objective:  Last practice recorded BP readings:  BP Readings from Last 3 Encounters:  10/16/20 129/62  10/08/20 108/65  04/16/20 110/65   Most recent eGFR/CrCl: No results found for: EGFR  No components found for: CRCL Current Barriers:  Knowledge Deficits related to basic understanding of hypertension pathophysiology and self care management Knowledge Deficits related to understanding of medications prescribed for management of hypertension Unable to independently manage HTN Does not contact provider office for questions/concerns Case Manager Clinical Goal(s):  patient will verbalize understanding of plan for hypertension  management patient will attend all scheduled medical appointments: 12-31-2020 at 220 pm patient will demonstrate improved adherence to prescribed treatment plan for hypertension as evidenced by taking all medications as prescribed, monitoring and recording blood pressure as directed, adhering to low sodium/DASH diet patient will demonstrate improved health management independence as evidenced by checking blood pressure as directed and notifying PCP if SBP>160 or DBP > 90, taking all medications as prescribe, and adhering to a low sodium diet as discussed. patient will verbalize basic understanding of hypertension disease process and self health management plan as evidenced by compliance with heart healthy diet, compliance with medications and working with the CCM team to optimize health and well being.  Interventions:  Collaboration with Olin Hauser, DO regarding development and update of comprehensive plan of care as evidenced by provider attestation and co-signature Inter-disciplinary care team collaboration (see longitudinal plan of care) Evaluation of current treatment plan related to hypertension self management and patient's adherence to plan as established by provider. 09-29-2020: The patient is doing well. Denies any new issues with HTN. States that he is going on a diet because he needs to lose some weight. Feels that this  will really help him. Education and support given. 10-16-2020: The patient had a fall on 10-07-2020 and was experiencing some dizziness. The patient may h ave had some orthostatic hypotension. Blood pressure in office today when getting flu vaccine was 129/62. Education given to the patient wife for monitoring for changes in blood pressure, taking and recording blood pressures.  Provided education to patient re: stroke prevention, s/s of heart attack and stroke, DASH diet, complications of uncontrolled blood pressure Reviewed medications with patient and discussed  importance of compliance. 10-16-2020: States compliance with medications. Denies any needs related to his HTN medications.  Discussed plans with patient for ongoing care management follow up and provided patient with direct contact information for care management team Advised patient, providing education and rationale, to monitor blood pressure daily and record, calling PCP for findings outside established parameters.  Reviewed scheduled/upcoming provider appointments including: 12-31-2020 at 220 pm Self-Care Activities: - Self administers medications as prescribed Attends all scheduled provider appointments Calls provider office for new concerns, questions, or BP outside discussed parameters Checks BP and records as discussed Follows a low sodium diet/DASH diet Patient Goals: - check blood pressure weekly - choose a place to take my blood pressure (home, clinic or office, retail store) - write blood pressure results in a log or diary - agree on reward when goals are met - agree to work together to make changes - ask questions to understand - have a family meeting to talk about healthy habits - learn about high blood pressure  Follow Up Plan: Telephone follow up appointment with care management team member scheduled for:11-24-2020 at 230 pm    Care Plan : RNCM: Management of HLD  Updates made by Vanita Ingles, RN since 10/16/2020 12:00 AM     Problem: RNCM: [place] Promotion or Disease Self-Management (General Plan of Care)   Priority: Medium     Long-Range Goal: RNCM;HLD Self-Management Plan Developed   Start Date: 03/13/2020  Expected End Date: 07/23/2021  This Visit's Progress: On track  Recent Progress: On track  Priority: Medium  Note:   Current Barriers:  Poorly controlled hyperlipidemia, complicated by OSA,HTN, CAD, HLD, CVA Current antihyperlipidemic regimen: Atorvastatin 80 mg QD Most recent lipid panel:     Component Value Date/Time   CHOL 127 10/12/2019 0810    TRIG 123 10/12/2019 0810   HDL 41 10/12/2019 0810   CHOLHDL 3.1 10/12/2019 0810   VLDL 25 07/24/2018 0458   LDLCALC 66 10/12/2019 0810   ASCVD risk enhancing conditions: age >58, HTN, CKD, CHF Unable to independently HLD Does not contact provider office for questions/concerns RN Care Manager Clinical Goal(s):  patient will work with Consulting civil engineer, providers, and care team towards execution of optimized self-health management plan patient will verbalize understanding of plan for effective management of HLD patient will work with [facility] and pcp  to address needs related to management of HLD  patient will demonstrate a decrease in HLD  exacerbations patient will take all medications exactly as prescribed and will call provider for medication related questions patient will verbalize basic understanding of HLD disease process and self health management plan patient will demonstrate understanding of rationale for each prescribed medication the patient will demonstrate ongoing self health care management ability Interventions: Collaboration with Olin Hauser, DO regarding development and update of comprehensive plan of care as evidenced by provider attestation and co-signature Inter-disciplinary care team collaboration (see longitudinal plan of care) Medication review performed; medication list updated in electronic medical  record.  Inter-disciplinary care team collaboration (see longitudinal plan of care) Referred to pharmacy team for assistance with HLD medication management Evaluation of current treatment plan related to HLD  and patient's adherence to plan as established by provider. 10-16-2020: The patient is doing well with management of HLD and heart health. Saw specialist recently and no changes in the plan of care.  Will have blood work on 11-07-2020. The patient states he is doing well and is happy that he is exercising more. Will continue to monitor.  Advised patient to call  the office for changes in condition, questions, or concerns Reviewed medications with patient and discussed compliance Reviewed scheduled/upcoming provider appointments including: 12-31-2020 at 220 pm Discussed plans with patient for ongoing care management follow up and provided patient with direct contact information for care management team Patient Goals/Self-Care Activities: - call for medicine refill 2 or 3 days before it runs out - call if I am sick and can't take my medicine - keep a list of all the medicines I take; vitamins and herbals too - learn to read medicine labels - use a pillbox to sort medicine - use an alarm clock or phone to remind me to take my medicine - change to whole grain breads, cereal, pasta - drink 6 to 8 glasses of water each day - eat 3 to 5 servings of fruits and vegetables each day - eat 5 or 6 small meals each day - eat fish at least once per week - fill half the plate with nonstarchy vegetables - limit fast food meals to no more than 1 per week - manage portion size - prepare main meal at home 3 to 5 days each week - read food labels for fat, fiber, carbohydrates and portion size - be open to making changes - I can manage, know and watch for signs of a heart attack - if I have chest pain, call for help - learn about small changes that will make a big difference - learn my personal risk factors  Follow Up Plan: Telephone follow up appointment with care management team member scheduled for: 11-24-2020 230 pm      Care Plan : RNCM: Fall Risk (Adult)  Updates made by Vanita Ingles, RN since 10/16/2020 12:00 AM     Problem: RNCM: Fall Risk   Priority: High     Long-Range Goal: RNCM: Absence of Fall and Fall-Related Injury   Start Date: 02/07/2020  Expected End Date: 07/23/2021  This Visit's Progress: Not on track  Recent Progress: On track  Priority: High  Note:   Current Barriers:  Knowledge Deficits related to fall precautions in patient  with multiple chronic conditions Decreased adherence to prescribed treatment for fall prevention Lacks social connections Does not contact provider office for questions/concerns Does not call office and report new falls when they occur Knowledge Deficits related to how to obtain handicap sticker to use with his car. 05-22-2020: Has handicap sticker for his care and states it is very helpful Chronic Disease Management support and education needs related to falls prevention and safety in patient with frequent falls due to unsteady gain and impaired mobility Lacks caregiver support.  Clinical Goal(s):  patient will demonstrate improved adherence to prescribed treatment plan for decreasing falls as evidenced by patient reporting and review of EMR patient will verbalize using fall risk reduction strategies discussed patient will not experience additional falls patient will verbalize understanding of plan for fall prevention and safety in the home patient will  work with pcp, [facility] staff to address needs related to getting paperwork for handicap sticker for the patients care- 05-22-2020: Completed and done.  patient will attend all scheduled medical appointments: No upcoming appointments. Knows to call the office for changes Interventions:  Collaboration with Olin Hauser, DO regarding development and update of comprehensive plan of care as evidenced by provider attestation and co-signature Inter-disciplinary care team collaboration (see longitudinal plan of care) Provided written and verbal education re: Potential causes of falls and Fall prevention strategies Reviewed medications and discussed potential side effects of medications such as dizziness and frequent urination. 05-22-2020: Denies any issues at this time with medications, dizziness. Does take Lasix that causes frequent urination. Review and education done. 07-28-2020: Denies any issues with medications causing dizziness, light  headedness or issues related to his fall incidences. Will continue to monitor. 09-29-2020: Denies any falls at this time. The patient states that he is doing well. Is doing more exercises and feels this is helping him with his strength and ability to walk better. Will continue to monitor. 10-16-2020: the patient had a new fall on 10-07-2020 and went to the ER on 10-08-2020 to be evaluated. 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 The patients wife tried to get him to go when her fell on the 27th but he got the bleeding to stop and would not go. Wanted to wait and see pcp. PCP advised that he go to the ER and have scans. Review with the patients wife the need to be safe and prevent falls. Fall prevention strategies reviewed.  Assessed for s/s of orthostatic hypotension. 09-29-2020: The patient denies any issues with blood pressure drops Assessed for falls since last encounter. 05-22-2020: The patient has had falls since March and had 2 "near falls" yesterday. States that the reason for falls is one of his toes is growing into the other toe and it is causing him to loose his balance. The patient states that he needs to be seen by a podiatrist. Is considering going to [facility] for this care, but did ask for recommendations by Dr. Parks Ranger. Will collaborate with pcp for recommendations on podiatrist. 07-28-2020: The patient states he had a fall 3 or 4 days ago when he was carrying a pot of soup out to the car and he looked away at his dog and then mis-stepped. He hit his knees but saved the pot of soup.  The patient states that he did not hit his head or fracture anything. Has places on bilateral knees and a bruise to his hip where the key fob was. He denies any acute distress. Reviewed with the patient safety and monitoring for changes in his environment. Will continue to monitor for changes. 09-29-2020: No new falls at this time. Will continue to monitor. 10-16-2020:  Fall on 10-07-2020 with injury. Evaluated in the ER and reported to the pcp.  Assessed patients knowledge of fall risk prevention secondary to previously provided education. 07-28-2020: Reviewed today wit the patient. Discussed the concern of increase risk of fractures or head injuries with frequent falls. The patient states he is trying to be safe. He does not use a cane or a walker. Encouraged the patient to consider using DME when ambutlating. 09-29-2020: The patient states he is doing better and denies any new concerns. The patient states that he is working out more and feels safe. 10-16-2020: The patients wife states that they went to pcp  office today to get their flu vaccines and that they took his blood pressure while there. The patients blood pressure was 129/62. Discussed orthostatic hypotension and changing position slowly.  Assessed working status of life alert bracelet and patient adherence Provided patient information for fall alert systems Evaluation of current treatment plan related to falls prevention and safety  and patient's adherence to plan as established by provider. 07-28-2020: Education and support given on fall prevention and safety concerns as the patient has an impaired toe that is causing him to have an unsteady gait. Will collaborate with pcp for recommendations. Review of being safe in his environment. 10-16-2020: No new changes in condition. Will continue to monitor.  Advised patient to call the office for new falls, seek emergent help if needed for falls with injury and work with [facility] for fall prevention and safety  Provided education to patient re: being safe and monitoring for potential high fall risk areas, using DME to help with falls, reporting falls.  Reviewed scheduled/upcoming provider appointments including: No upcoming appointments noted. Knows to call for changes, encouraged patient to call to get an appointment with podiatrist. The patient does not wish to return to  previous podiatrist.  Discussed plans with patient for ongoing care management follow up and provided patient with direct contact information for care management team Self-Care Deficits:  Unable to independently manage falls as evidence of fall last week when leaving a restaurant. 05-22-2020: Has had reoccurrence of falls since last outreach in March. 9-19-202: No new falls at this time. Will continue to monitor. 10-16-2020: New reported fall on 10-07-2020 Lacks social connections Does not contact provider office for questions/concerns Patient Goals:  - Utilize cane (assistive device) appropriately with all ambulation- states he has misplaced his cane but is actively looking for it. 09-29-2020: Review of use of DME when ambulating. The patient denies using a cane or a walker. Will consider. 10-16-2020: Discussed with the patients wife to encourage use of DME when walking.  - De-clutter walkways - Change positions slowly - Wear secure fitting shoes at all times with ambulation - Utilize home lighting for dim lit areas - Demonstrate self and pet awareness at all times - activities of daily living skills assessed - assistive or adaptive device use encouraged - barriers to physical activity or exercise addressed - barriers to physical activity or exercise identified - barriers to safety identified - cognition assessed - cognitive-stimulating activities promoted - fall prevention plan reviewed and updated - fear of falling, loss of independence and pain acknowledged - medication list reviewed - modification of home and work environment promoted Follow Up Plan: Telephone follow up appointment with care management team member scheduled for: 11-24-2020 at 230 pm     Plan:Telephone follow up appointment with care management team member scheduled for:  11-24-2020 at 230 pm  [place], MSN, Lamont Engelhard Mobile:  [phone]

## 2020-10-16 NOTE — Patient Instructions (Signed)
Visit Information  PATIENT GOALS:  Goals Addressed             This Visit's Progress    RNCM: Prevent Falls and Injury       Follow Up Date 11-24-2020   - add more outdoor lighting - always use handrails on the stairs - always wear low-heeled or flat shoes or slippers with nonskid soles - call the doctor if I am feeling too drowsy - install bathroom grab bars - keep a flashlight by the bed - keep my cell phone with me always - learn how to get back up if I fall - make an emergency alert plan in case I fall - pick up clutter from the floors - use a nonslip pad with throw rugs, or remove them completely - use a cane or walker - use a nightlight in the bathroom - wear my glasses and/or hearing aid    Why is this important?   Most falls happen when it is hard for you to walk safely. Your balance may be off because of an illness. You may have pain in your knees, hip or other joints.  You may be overly tired or taking medicines that make you sleepy. You may not be able to see or hear clearly.  Falls can lead to broken bones, bruises or other injuries.  There are things you can do to help prevent falling.     Notes: patient had a fall last week when leaving a restaurant . 05-22-2020: The patient states that he had "near falls" yesterday but he caught himself. He has had falls since March. Has a toe that is bothering him. Extensive education about falls prevention and safety. 07-28-2020: The patient states he had a fall about 3 or 4 days ago. He was carrying a pot of soup outside and was watching his dog and mis-stepped and fell. He saved the pot of soup but he hit both knees. He also has a bruise where his key fob was in the pocket of his pants. His wife has gotten him a knee brace. He denies hitting his head. Denies any other injures. Review of safety and preventing falls. 09-29-2020: The patient denies any new falls. The patient states that he is doing well and exercising more. He is doing  a 1000 steps forward and 1000 steps backwards on the cubie daily. He is also planning on going on a diet with his daughters boyfriend. States he wants to lose some weight and he felt having a partner would be helpful . Education on pacing activity and monitoring for changes in condition. 10-16-2020: The patients wife states the patient had a new fall on 10-07-2020 and was evaluated on 10-08-2020 in the ER for the fall. The patient was bending over when outside watering the lawn and got dizzy. Likely had orthostatic hypotension. He fell and stuck his head which is stone foundation and walkway. The patient did not want to go to the ER but wanted to come and see pcp. PCP instructed the patient to go to the ER to have scans. There was concern that he may have some internal bleeding due to blood thinners. Wife states he has had no further falls. Review of safety concerns.        Patient verbalizes understanding of instructions provided today and agrees to view in MyChart.   Telephone follow up appointment with care management team member scheduled for: 11-24-2020 at 230 pm  Alto Denver RN, MSN, CCM [facility]  Coordinator [employer]  [facility] [employer] Mobile: [phone]

## 2020-10-16 NOTE — Progress Notes (Signed)
Subjective:    Patient ID: Luke Mckee, male    DOB: 1940-10-31, 80 y.o.   MRN: [id number]  Luke Mckee is a 80 y.o. male presenting on 10/16/2020 for Fall  Here with wife Luke Mckee  HPI   History of CVA with residual deficit R sided weakness / multiple L MCA / TIA history HTN CAD s/p CABG with stable angina on nitrate  Followed by Neuro/Cards, see prior notes for background information. S/p loop recorder. No identified AFib. He had issue with stroke on Eliquis. Now he is back on DAPT Plavix + ASA 81mg  and doing better - Low BP by report, still taking diuretics due to edema per Cardiology - lasix 80 BID He takes imdur 30mg  daily and it controls CP. He has occasional dizziness mostly postural or movement related. Intermittent not consistent, not linked to low BP Has missed dose Lasix occasionally Denies any chest pain, new focal weakness, dyspnea, worsening edema, near syncope or syncope   Elevated A1c / PreDM Morbid Obesity BMI >37 Last result A1c 6.1 10/2019   Hypothyroidism Chronic problem Last lab now 03/2020 showed normal Thyroid panel and T4 He is taking Levothyroxine 11/2019 daily, currently doing well - with some improvement after med adjustment of changing dosing so does not take other meds with levothyroxine.   Anxiety / Depression recurrent in partial remission - Last visit with me 04/2020, for same problem anxiety, treated with dose increase Sertraline from 150 to 200mg , see prior notes for background information. - Interval update with improvement. - Today patient reports now taking Sertraline 100mg  x 2 = 200mg  and doing better, also has Lorazepam 0.5mg  as a PRN medication    Rarely bothering him. Has occasional mood symptoms. Doing well on Sertraline. Also for anxiety, takes Lorazepam rarely, has some left   OSA on CPAP Followed by Pulmonology, no repeat sleep study - Today reports that sleep apnea is well controlled. He uses the CPAP machine every night. Tolerates  the machine well, and thinks that sleeps better with it and feels good. No new concerns or symptoms.   Health Maintenance: Due for Flu Shot, will receive today   COVID Booster on 10/20/20  Depression screen [facility] 2/9 10/16/2020 07/29/2020 07/28/2020  Decreased Interest 0 0 0  Down, Depressed, Hopeless 0 0 0  PHQ - 2 Score 0 0 0  Altered sleeping 0 - -  Tired, decreased energy 0 - -  Change in appetite 0 - -  Feeling bad or failure about yourself  0 - -  Trouble concentrating 0 - -  Moving slowly or fidgety/restless 0 - -  Suicidal thoughts 0 - -  PHQ-9 Score 0 - -  Difficult doing work/chores Not difficult at all - -  Some recent data might be hidden   GAD 7 : Generalized Anxiety Score 10/17/2019 04/16/2019 07/31/2018 07/20/2018  Nervous, Anxious, on Edge 1 1 2  0  Control/stop worrying 1 0 1 0  Worry too much - different things 1 1 1  0  Trouble relaxing 0 0 0 0  Restless 0 0 0 0  Easily annoyed or irritable 0 1 1 0  Afraid - awful might happen 0 0 0 0  Total GAD 7 Score 3 3 5  0  Anxiety Difficulty Not difficult at all Not difficult at all Somewhat difficult Not difficult at all      Social History   Tobacco Use   Smoking status: Never   Smokeless tobacco: Never  Vaping Use   Vaping  Use: Never used  Substance Use Topics   Alcohol use: Not Currently    Comment: past   Drug use: Never    Review of Systems Per HPI unless specifically indicated above     Objective:    BP 129/62   Pulse 65   Ht 5\' 8"  (1.727 m)   Wt 246 lb 12.8 oz (111.9 kg)   SpO2 97%   BMI 37.53 kg/m   Wt Readings from Last 3 Encounters:  10/16/20 246 lb 12.8 oz (111.9 kg)  10/08/20 238 lb 1.6 oz (108 kg)  07/29/20 240 lb (108.9 kg)    Physical Exam Vitals and nursing note reviewed.  Constitutional:      General: He is not in acute distress.    Appearance: He is well-developed. He is not diaphoretic.     Comments: Well-appearing, comfortable, cooperative  HENT:     Head: Normocephalic and  atraumatic.  Eyes:     General:        Right eye: No discharge.        Left eye: No discharge.     Conjunctiva/sclera: Conjunctivae normal.  Neck:     Thyroid: No thyromegaly.  Cardiovascular:     Rate and Rhythm: Normal rate and regular rhythm.     Pulses: Normal pulses.     Heart sounds: Normal heart sounds. No murmur heard. Pulmonary:     Effort: Pulmonary effort is normal. No respiratory distress.     Breath sounds: Normal breath sounds. No wheezing or rales.  Musculoskeletal:        General: Normal range of motion.     Cervical back: Normal range of motion and neck supple.  Lymphadenopathy:     Cervical: No cervical adenopathy.  Skin:    General: Skin is warm and dry.     Findings: Bruising (left upper arm) present. No erythema or rash.  Neurological:     Mental Status: He is alert and oriented to person, place, and time. Mental status is at baseline.  Psychiatric:        Behavior: Behavior normal.     Comments: Well groomed, good eye contact, normal speech and thoughts      Results for orders placed or performed in visit on 10/13/20  CUP PACEART REMOTE DEVICE CHECK  Result Value Ref Range   Date Time Interrogation Session 12/13/20    Pulse Generator Manufacturer MERM    Pulse Gen Model [id number] Reveal LINQ    Pulse Gen Serial Number [id number]    Clinic Name [facility]    Implantable Pulse Generator Type ICM/ILR    Implantable Pulse Generator Implant Date [facility]    Eval Rhythm SR at 85 bpm       Assessment & Plan:   Problem List Items Addressed This Visit     Anxiety - Primary   Relevant Medications   sertraline (ZOLOFT) 100 MG tablet   LORazepam (ATIVAN) 0.5 MG tablet   Other Visit Diagnoses     Coronary artery disease involving coronary bypass graft of native heart without angina pectoris       Relevant Medications   nitroGLYCERIN (NITROSTAT) 0.4 MG SL tablet   spironolactone (ALDACTONE) 25 MG tablet   Essential hypertension       Relevant  Medications   nitroGLYCERIN (NITROSTAT) 0.4 MG SL tablet   spironolactone (ALDACTONE) 25 MG tablet   Needs flu shot       Relevant Orders   Flu Vaccine QUAD High Dose(Fluad)  CAD HTN Hx Stroke Followed by Cardiology on med management Will re order NTG PRN rarely using Re order Spironolactone 12.5mg  daily for diuretic  Anxiety Mood in remission Improved on Sertraline 200mg  (x 2 of the 100mg ) new order Lorazepam PDMP reviewed re order today, using sparingly with caution  Flu Shot today    Meds ordered this encounter  Medications   sertraline (ZOLOFT) 100 MG tablet    Sig: Take 2 tablets (200 mg total) by mouth daily.    Dispense:  180 tablet    Refill:  3   LORazepam (ATIVAN) 0.5 MG tablet    Sig: Take 1 tablet (0.5 mg total) by mouth daily as needed for anxiety or sleep.    Dispense:  30 tablet    Refill:  1   nitroGLYCERIN (NITROSTAT) 0.4 MG SL tablet    Sig: Place 1 tablet (0.4 mg total) under the tongue every 5 (five) minutes as needed for chest pain.    Dispense:  20 tablet    Refill:  2   spironolactone (ALDACTONE) 25 MG tablet    Sig: Take 0.5 tablets (12.5 mg total) by mouth daily.    Dispense:  45 tablet    Refill:  3      Follow up plan: Return if symptoms worsen or fail to improve, for keep scheduled apt.   , DO [facility] 10/16/2020, 9:28 AM

## 2020-10-20 NOTE — Progress Notes (Signed)
Carelink Summary Report / Loop Recorder 

## 2020-11-02 DIAGNOSIS — G4733 Obstructive sleep apnea (adult) (pediatric): Secondary | ICD-10-CM | POA: Diagnosis not present

## 2020-11-03 ENCOUNTER — Other Ambulatory Visit: Payer: Self-pay | Admitting: Family Medicine

## 2020-11-03 ENCOUNTER — Ambulatory Visit: Payer: Medicare PPO | Admitting: Pharmacist

## 2020-11-03 DIAGNOSIS — E782 Mixed hyperlipidemia: Secondary | ICD-10-CM

## 2020-11-03 DIAGNOSIS — R0600 Dyspnea, unspecified: Secondary | ICD-10-CM

## 2020-11-03 DIAGNOSIS — I10 Essential (primary) hypertension: Secondary | ICD-10-CM

## 2020-11-03 DIAGNOSIS — J3089 Other allergic rhinitis: Secondary | ICD-10-CM

## 2020-11-03 MED ORDER — ALBUTEROL SULFATE HFA 108 (90 BASE) MCG/ACT IN AERS: 1.0000 | INHALATION_SPRAY | RESPIRATORY_TRACT | 3 refills | Status: DC | PRN

## 2020-11-03 NOTE — Chronic Care Management (AMB) (Signed)
Chronic Care Management Pharmacy Note  11/03/2020 Name:  Luke Mckee MRN:  [id number] DOB:  05-25-1940   Subjective: Luke Mckee is an 80 y.o. year old male who is a primary patient of Olin Hauser, DO.  The CCM team was consulted for assistance with disease management and care coordination needs.    Engaged with patient by telephone for follow up visit in response to provider referral for pharmacy case management and/or care coordination services.   Consent to Services:  The patient was given information about Chronic Care Management services, agreed to services, and gave verbal consent prior to initiation of services.  Please see initial visit note for detailed documentation.   Patient Care Team: Olin Hauser, DO as PCP - General (Family Medicine) Curley Spice Virl Diamond, RPH-CPP as Pharmacist Vanita Ingles, RN as Case Manager (General Practice)  Recent office visits: Office Visit with PCP on 10/6  Recent consult visits: Office Visit with [facility] on 8/17   Hospital visits: Patient seen at [facility] ED on 9/28 for facial laceration  Objective:  Lab Results  Component Value Date   CREATININE 0.99 10/12/2019   CREATININE 0.81 08/03/2018   CREATININE 0.84 07/25/2018    Lab Results  Component Value Date   HGBA1C 6.1 (H) 10/12/2019   Last diabetic Eye exam: No results found for: HMDIABEYEEXA  Last diabetic Foot exam: No results found for: HMDIABFOOTEX      Component Value Date/Time   CHOL 127 10/12/2019 0810   TRIG 123 10/12/2019 0810   HDL 41 10/12/2019 0810   CHOLHDL 3.1 10/12/2019 0810   VLDL 25 07/24/2018 0458   LDLCALC 66 10/12/2019 0810    Hepatic Function Latest Ref Rng & Units 10/12/2019 07/24/2018 07/23/2018  Total Protein 6.1 - 8.1 g/dL 6.9 6.6 7.3  Albumin 3.5 - 5.0 g/dL - 3.4(L) 3.8  AST 10 - 35 U/L _0 ALT 9 - 46 U/L _1 Alk Phosphatase 38 - 126 U/L - 47 54  Total Bilirubin 0.2 - 1.2  mg/dL 0.4 0.8 0.7    Lab Results  Component Value Date/Time   TSH 1.84 03/19/2020 08:33 AM   TSH 7.25 (H) 12/12/2019 10:04 AM   FREET4 1.5 03/19/2020 08:33 AM   FREET4 1.1 12/12/2019 10:04 AM   Social History   Tobacco Use  Smoking Status Never  Smokeless Tobacco Never   BP Readings from Last 3 Encounters:  10/16/20 129/62  10/08/20 108/65  04/16/20 110/65   Pulse Readings from Last 3 Encounters:  10/16/20 65  10/08/20 68  04/16/20 74   Wt Readings from Last 3 Encounters:  10/16/20 246 lb 12.8 oz (111.9 kg)  10/08/20 238 lb 1.6 oz (108 kg)  07/29/20 240 lb (108.9 kg)    Assessment: Review of patient past medical history, allergies, medications, health status, including review of consultants reports, laboratory and other test data, was performed as part of comprehensive evaluation and provision of chronic care management services.   SDOH:  (Social Determinants of Health) assessments and interventions performed:    CCM Care Plan  Allergies  Allergen Reactions   Ace Inhibitors    Cephalexin     Vomiting and diarrhea   Crestor [Rosuvastatin Calcium] Other (See Comments)    myalgia   Tape Rash    blistering    Medications Reviewed Today     Reviewed by Rennis Petty, RPH-CPP (Pharmacist) on 11/03/20 at 1134  Med List Status: <None>  Medication Order Taking? Sig Documenting Provider Last Dose Status Informant  acetaminophen (TYLENOL) 650 MG CR tablet [id number] Yes Take 1,300 mg by mouth at bedtime as needed. [provider] Taking Active Spouse/Significant Other  aspirin 81 MG chewable tablet [id number] Yes Chew 81 mg by mouth daily. [provider] Taking Active   atenolol (TENORMIN) 50 MG tablet [id number] Yes Take 1 tablet (50 mg total) by mouth at bedtime. Olin Hauser, DO Taking Active   atorvastatin (LIPITOR) 80 MG tablet [id number] Yes Take 1 tablet (80 mg total) by mouth daily. Olin Hauser, DO Taking Active    Cholecalciferol (VITAMIN D) 50 MCG (2000 UT) tablet [id number] Yes Take 4,000 Units by mouth daily. [provider] Taking Active Spouse/Significant Other  clopidogrel (PLAVIX) 75 MG tablet [id number] Yes TAKE 1 TABLET(75 MG) BY MOUTH DAILY Gollan, Kathlene November, MD Taking Active   clotrimazole-betamethasone (LOTRISONE) cream [id number] Yes Apply twice a day for 1-2 weeks, may repeat if need in future Olin Hauser, DO Taking Active   co-enzyme Q-10 30 MG capsule [id number] Yes Take 100 mg by mouth daily. [provider] Taking Active   diclofenac sodium (VOLTAREN) 1 % GEL [id number] Yes Apply 2 g topically 4 (four) times daily as needed for pain. Foot pain [provider] Taking Active Spouse/Significant Other  docusate sodium (COLACE) 100 MG capsule [id number] Yes Take 200 mg by mouth daily. [provider] Taking Active Spouse/Significant Other  furosemide (LASIX) 80 MG tablet [id number] Yes Take 1 tablet (80 mg total) by mouth 2 (two) times daily. Olin Hauser, DO Taking Active   isosorbide mononitrate (IMDUR) 30 MG 24 hr tablet [id number] Yes Take 1 tablet (30 mg total) by mouth daily. Olin Hauser, DO Taking Active   levothyroxine (SYNTHROID) 125 MCG tablet [id number] Yes Take 1 tablet (125 mcg total) by mouth daily before breakfast. Olin Hauser, DO Taking Active   loratadine (CLARITIN) 10 MG tablet [id number] Yes Take 10 mg by mouth daily. [provider] Taking Active   LORazepam (ATIVAN) 0.5 MG tablet [id number] Yes Take 1 tablet (0.5 mg total) by mouth daily as needed for anxiety or sleep. Olin Hauser, DO Taking Active   montelukast (SINGULAIR) 10 MG tablet [id number] Yes Take 1 tablet (10 mg total) by mouth at bedtime. Olin Hauser, DO Taking Active   Multiple Vitamin (MULTIVITAMIN) tablet [id number] Yes Take 1 tablet by mouth daily. [provider] Taking Active Spouse/Significant  Other  nitroGLYCERIN (NITROSTAT) 0.4 MG SL tablet [id number] Yes Place 1 tablet (0.4 mg total) under the tongue every 5 (five) minutes as needed for chest pain. Olin Hauser, DO Taking Active   PATADAY 0.1 % ophthalmic solution [id number] Yes Place into both eyes as needed.  [provider] Taking Active   senna (SENOKOT) 8.6 MG tablet [id number] Yes Take 1 tablet by mouth daily. [provider] Taking Active   sertraline (ZOLOFT) 100 MG tablet [id number] Yes Take 2 tablets (200 mg total) by mouth daily. Olin Hauser, DO Taking Active   spironolactone (ALDACTONE) 25 MG tablet [id number] Yes Take 0.5 tablets (12.5 mg total) by mouth daily. Olin Hauser, DO Taking Active             Patient Active Problem List   Diagnosis Date Noted   Hallux limitus, acquired, right 11/12/2019   Pre-diabetes 10/17/2019   Recurrent major depressive disorder, in partial remission ([place]) 04/16/2019   Morbid obesity ([place]) 04/16/2019   Vasomotor rhinitis  10/25/2018   Hearing loss 08/28/2018   Osteoarthritis 08/28/2018   Tinnitus 08/28/2018   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 ([place]) 07/31/2018   Stroke ([place]) 07/24/2018   TIA (transient ischemic attack) 07/23/2018   Slurred speech 07/23/2018   Right arm weakness 07/23/2018   Hypokalemia 07/23/2018   Anxiety 07/20/2018   Atherosclerosis of native coronary artery of native heart with stable angina pectoris ([place]) 06/16/2018   Hx of CABG 06/16/2018   Mixed hyperlipidemia 06/16/2018   Benign essential HTN 06/16/2018   History of cerebrovascular accident (CVA) with residual deficit 31/54/0086   Systolic CHF ([place]) 76/19/5093   Hypertension 02/15/2018   S/P total knee arthroplasty, right 02/15/2018   Primary osteoarthritis of both knees 11/25/2017   Abnormal nuclear stress test 06/30/2016   Hallux limitus of right foot 10/22/2015   Anesthesia complication 26/71/2458   GERD  (gastroesophageal reflux disease) 10/10/2015   Hypothyroidism 10/10/2015   OSA on CPAP 01/10/2007   Diagnosis unknown 01/12/2003    Immunization History  Administered Date(s) Administered   Fluad Quad(high Dose 65+) 10/16/2020   Hepatitis A, Adult 09/23/1996, 03/16/1997   Influenza Split 10/11/2016   Influenza Whole 10/27/1996, 11/02/1997, 12/24/1998, 10/31/1999, 11/13/2000   Influenza, High Dose Seasonal PF 09/05/2018   Influenza-Unspecified 09/25/2019   Moderna Sars-Covid-2 Vaccination 02/23/2019, 03/24/2019, 11/14/2019, 06/03/2020   OPV 02/11/1985   PPD Test 10/31/1999, 11/13/2000   Pneumococcal Conjugate-13 11/08/2014   Pneumococcal Polysaccharide-23 11/23/2000, 03/01/2008   Td 09/12/1994, 03/01/2008   Tdap 10/08/2020   Typhoid Parenteral 09/23/1996   Typhoid Parenteral, 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[employer]) 11/11/1993   Yellow Fever 01/12/1995   Zoster Recombinat (Shingrix) 06/25/2009, 07/24/2020    Conditions to be addressed/monitored: HTN, HLD, hypothyroidism  Care Plan : PharmD - Med Management  Updates made by Rennis Petty, RPH-CPP since 11/03/2020 12:00 AM     Problem: Disease Progression      Long-Range Goal: Disease Progression Prevented or Minimized   Start Date: 02/04/2020  Expected End Date: 05/04/2020  This Visit's Progress: On track  Recent Progress: On track  Priority: High  Note:   Current Barriers:  Chronic Disease Management support, education, and care coordination needs related to CAD, HTN, HLD, and hx CVA  Pharmacist Clinical Goal(s):  Over the next 90 days, patient will adhere to plan to optimize therapeutic regimen for hypothyroidism as evidenced by report of adherence to recommended medication management changes through collaboration with PharmD and provider.    Interventions: 1:1 collaboration with Olin Hauser, DO regarding development and update of comprehensive plan of care as evidenced by provider attestation and  co-signature Inter-disciplinary care team collaboration (see longitudinal plan of care) Perform chart review Office Visit with [facility] Neurology on 8/17 Patient seen at [facility] ED on 9/28 for facial laceration Office Visit with PCP on 10/6 Reports received Moderna Bivalent Covid-19 booster 10/10 at [facility] Comprehensive medication review performed; medication list updated in electronic medical record  Seasonal allergies: Current treatment: Loratadine 10 mg daily Montelukast 10 mg QHS Patient reports recently discussed with PCP at Office Visit on 10/6 trying an albuterol inhaler to use as needed when patient becomes short of breath. Patient interested in trying this inhaler Collaborate with PCP. Rx for albuterol inhaler sent to patient's pharmacy and patient notified.  Hypothyroidism Controled; current treatment: levothyroxine 125 mcg daily before breakfast Reports patient continues to levothyroxine consistently in the morning on an empty stomach, at least 30 minutes before food and at least 4 hours from calcium- or  iron-containing products using new weekly pill organizer  Hypertension Controlled; current treatment: Atenolol 50 mg daily at bedtime Furosemide 80 mg twice daily Isosorbide ER 30 mg once daily Spironolactone 25 mg - 1/2 tablet (12.5 mg) once daily Reports last checked BP: 10/10: 137/84, HR 70 Encourage to continue to monitor home BP 1-2 times/week, keep log of results and call providers for readings outside of established parameters.   Patient Goals/Self-Care Activities Over the next 90 days, patient will:  - take medications as prescribed with assistance of wife   Note: patient uses weekly pillbox as filled by wife - check blood pressure, document, and provide at future appointments  Next appointment with PCP on 12/21 (lab work on 12/14)  Follow Up Plan: Telephone follow up appointment with care management team member scheduled for: 01/21/2021 at  11 am      [name] preferred pharmacy is:  [facility] [place], [place] [address] [place] [address] Phone: [phone] Fax: [phone]  [facility] - [address], [place] [address] Phone: [phone] Fax: [phone]   Follow Up:  Patient agrees to Care Plan and Follow-up.  Luke Mckee, PharmD, Para [place], CPP Clinical Pharmacist [facility] [phone]

## 2020-11-03 NOTE — Patient Instructions (Signed)
Visit Information  PATIENT GOALS:  Goals Addressed             This Visit's Progress    Pharmacy Goals       It was great talking with you today!  Please check your blood pressure 1-2 times/week at home and keep a record to bring to medical appointments  Our goal bad cholesterol, or LDL, is less than 70 . This is why it is important to continue taking your atorvastatin  Feel free to call me with any questions or concerns. I look forward to our next call!   Estelle Grumbles, PharmD, BCACP Clinical Pharmacist [facility] [phone]          The patient verbalized understanding of instructions, educational materials, and care plan provided today and declined offer to receive copy of patient instructions, educational materials, and care plan.   Telephone follow up appointment with care management team member scheduled for: 01/21/2021 at 11 am

## 2020-11-06 LAB — CUP PACEART REMOTE DEVICE CHECK
Date Time Interrogation Session: 20221027000050
Implantable Pulse Generator Implant Date: 20200714

## 2020-11-10 DIAGNOSIS — I10 Essential (primary) hypertension: Secondary | ICD-10-CM | POA: Diagnosis not present

## 2020-11-10 DIAGNOSIS — E782 Mixed hyperlipidemia: Secondary | ICD-10-CM

## 2020-11-17 ENCOUNTER — Ambulatory Visit (INDEPENDENT_AMBULATORY_CARE_PROVIDER_SITE_OTHER): Payer: Medicare PPO

## 2020-11-17 DIAGNOSIS — I63412 Cerebral infarction due to embolism of left middle cerebral artery: Secondary | ICD-10-CM | POA: Diagnosis not present

## 2020-11-21 NOTE — Progress Notes (Signed)
Carelink Summary Report / Loop Recorder 

## 2020-11-24 ENCOUNTER — Ambulatory Visit (INDEPENDENT_AMBULATORY_CARE_PROVIDER_SITE_OTHER): Payer: Medicare PPO

## 2020-11-24 ENCOUNTER — Telehealth: Payer: Medicare PPO | Admitting: General Practice

## 2020-11-24 DIAGNOSIS — E782 Mixed hyperlipidemia: Secondary | ICD-10-CM

## 2020-11-24 DIAGNOSIS — R296 Repeated falls: Secondary | ICD-10-CM

## 2020-11-24 DIAGNOSIS — I10 Essential (primary) hypertension: Secondary | ICD-10-CM

## 2020-11-24 DIAGNOSIS — I5022 Chronic systolic (congestive) heart failure: Secondary | ICD-10-CM

## 2020-11-24 DIAGNOSIS — F3341 Major depressive disorder, recurrent, in partial remission: Secondary | ICD-10-CM

## 2020-11-24 NOTE — Chronic Care Management (AMB) (Signed)
[facility] Chronic Care Management   CCM RN Visit Note  11/24/2020 Name: Luke Mckee MRN: [id number] DOB: 10-Jul-1940  Subjective: Luke Mckee is a 80 y.o. year old male who is a primary care patient of Olin Hauser, DO. The care management team was consulted for assistance with disease management and care coordination needs.    Engaged with patient by telephone for follow up visit in response to provider referral for case management and/or care coordination services.   Consent to Services:  The patient was given information about Chronic Care Management services, agreed to services, and gave verbal consent prior to initiation of services.  Please see initial visit note for detailed documentation.   Patient agreed to services and verbal consent obtained.   Assessment: Review of patient past medical history, allergies, medications, health status, including review of consultants reports, laboratory and other test data, was performed as part of comprehensive evaluation and provision of chronic care management services.   SDOH (Social Determinants of Health) assessments and interventions performed:    CCM Care Plan  Allergies  Allergen Reactions   Ace Inhibitors    Cephalexin     Vomiting and diarrhea   Crestor [Rosuvastatin Calcium] Other (See Comments)    myalgia   Tape Rash    blistering    Outpatient Encounter Medications as of 11/24/2020  Medication Sig   acetaminophen (TYLENOL) 650 MG CR tablet Take 1,300 mg by mouth at bedtime as needed.   albuterol (VENTOLIN HFA) 108 (90 Base) MCG/ACT inhaler Inhale 1-2 puffs into the lungs every 4 (four) hours as needed for wheezing or shortness of breath (cough).   aspirin 81 MG chewable tablet Chew 81 mg by mouth daily.   atenolol (TENORMIN) 50 MG tablet Take 1 tablet (50 mg total) by mouth at bedtime.   atorvastatin (LIPITOR) 80 MG tablet Take 1 tablet (80 mg total) by mouth daily.   Cholecalciferol (VITAMIN D) 50 MCG (2000 UT)  tablet Take 4,000 Units by mouth daily.   clopidogrel (PLAVIX) 75 MG tablet TAKE 1 TABLET(75 MG) BY MOUTH DAILY   clotrimazole-betamethasone (LOTRISONE) cream Apply twice a day for 1-2 weeks, may repeat if need in future   co-enzyme Q-10 30 MG capsule Take 100 mg by mouth daily.   diclofenac sodium (VOLTAREN) 1 % GEL Apply 2 g topically 4 (four) times daily as needed for pain. Foot pain   docusate sodium (COLACE) 100 MG capsule Take 200 mg by mouth daily.   furosemide (LASIX) 80 MG tablet Take 1 tablet (80 mg total) by mouth 2 (two) times daily.   isosorbide mononitrate (IMDUR) 30 MG 24 hr tablet Take 1 tablet (30 mg total) by mouth daily.   levothyroxine (SYNTHROID) 125 MCG tablet Take 1 tablet (125 mcg total) by mouth daily before breakfast.   loratadine (CLARITIN) 10 MG tablet Take 10 mg by mouth daily.   LORazepam (ATIVAN) 0.5 MG tablet Take 1 tablet (0.5 mg total) by mouth daily as needed for anxiety or sleep.   montelukast (SINGULAIR) 10 MG tablet Take 1 tablet (10 mg total) by mouth at bedtime.   Multiple Vitamin (MULTIVITAMIN) tablet Take 1 tablet by mouth daily.   nitroGLYCERIN (NITROSTAT) 0.4 MG SL tablet Place 1 tablet (0.4 mg total) under the tongue every 5 (five) minutes as needed for chest pain.   PATADAY 0.1 % ophthalmic solution Place into both eyes as needed.    senna (SENOKOT) 8.6 MG tablet Take 1 tablet by mouth daily.   sertraline (ZOLOFT)  100 MG tablet Take 2 tablets (200 mg total) by mouth daily.   spironolactone (ALDACTONE) 25 MG tablet Take 0.5 tablets (12.5 mg total) by mouth daily.   No facility-administered encounter medications on file as of 11/24/2020.    Patient Active Problem List   Diagnosis Date Noted   Hallux limitus, acquired, right 11/12/2019   Pre-diabetes 10/17/2019   Recurrent major depressive disorder, in partial remission ([place]) 04/16/2019   Morbid obesity ([place]) 04/16/2019   Vasomotor rhinitis 10/25/2018   Hearing loss 08/28/2018   Osteoarthritis  08/28/2018   Tinnitus 08/28/2018   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 ([place]) 07/31/2018   Stroke ([place]) 07/24/2018   TIA (transient ischemic attack) 07/23/2018   Slurred speech 07/23/2018   Right arm weakness 07/23/2018   Hypokalemia 07/23/2018   Anxiety 07/20/2018   Atherosclerosis of native coronary artery of native heart with stable angina pectoris ([place]) 06/16/2018   Hx of CABG 06/16/2018   Mixed hyperlipidemia 06/16/2018   Benign essential HTN 06/16/2018   History of cerebrovascular accident (CVA) with residual deficit 63/87/5643   Systolic CHF ([place]) 32/95/1884   Hypertension 02/15/2018   S/P total knee arthroplasty, right 02/15/2018   Primary osteoarthritis of both knees 11/25/2017   Abnormal nuclear stress test 06/30/2016   Hallux limitus of right foot 10/22/2015   Anesthesia complication 16/60/6301   GERD (gastroesophageal reflux disease) 10/10/2015   Hypothyroidism 10/10/2015   OSA on CPAP 01/10/2007   Diagnosis unknown 01/12/2003    Conditions to be addressed/monitored:CHF, HTN, HLD, Depression, and Falls and safety concerns  Care Plan : RNCM: Heart Failure (Adult)  Updates made by Vanita Ingles, RN since 11/24/2020 12:00 AM  Completed 11/24/2020   Problem: RNCM: Symptom Exacerbation (Heart Failure) Resolved 11/24/2020  Priority: Medium     Long-Range Goal: RNCM: Symptom Exacerbation Prevented or Minimized Completed 11/24/2020  Start Date: 03/13/2020  Expected End Date: 07/23/2021  Recent Progress: On track  Priority: Medium  Note:   Current Barriers: Resolving, duplicate goal  Knowledge deficits related to basic heart failure pathophysiology and self care management Unable to independently manage HF Lacks social connections Does not contact provider office for questions/concerns Lack of scale in home Financial strain  Nurse Case Manager Clinical Goal(s):   patient will weigh self daily and record  patient will verbalize  understanding of Heart Failure Action Plan and when to call doctor  patient will take all Heart Failure mediations as prescribed Interventions:  Collaboration with Olin Hauser, DO regarding development and update of comprehensive plan of care as evidenced by provider attestation and co-signature Inter-disciplinary care team collaboration (see longitudinal plan of care) Basic overview and discussion of pathophysiology of Heart Failure Provided written and verbal education on low sodium diet.  09-29-2020: Verbalized compliance with heart healthy diet. States he is drinking water. He is planning on doing a diet with his daughters boyfriend to lose weight. He will do away with breads and other things. Denies any acute distress.  Reviewed Heart Failure Action Plan in depth and provided written copy. 05-22-2020: The patient is taking Lasix 80 mg BID. States he does not have swelling in his legs and feet today. He does still have the fungus on his toe nails. Denies any recent exacerbations of heart failure. 09-29-2020: Denies edema today. States he is compliant with his medications and dietary restrictions. Saw his cardiologist in June. No changes in the plan of care.  Assessed for scales in home. Has a scales but does not  weight daily. 09-29-2020: weight is 246. Wants to lose weight.  Discussed importance of daily weight- 09-29-2020: The patient does not weight daily. States he knows when his weight fluctuates.  States his HF is well controlled at this time. Encouraged the patient to weigh daily.  Reviewed role of diuretics in prevention of fluid overload. 09-29-2020: Review of taking medications as directed. The patient verbalized compliance today. Will continue to monitor.   Patient Goals/Self-Care Activities patient will:  - Take Heart Failure Medications as prescribed - Weigh daily and record (notify MD with 3 lb weight gain over night or 5 lb in a week) - Follow CHF Action Plan - Adhere to low  sodium diet - barriers to lifestyle changes reviewed and addressed - barriers to treatment reviewed and addressed - cognitive screening completed and reviewed - depression screen reviewed - health literacy screening completed or reviewed - rescue (action) plan reviewed - self-awareness of signs/symptoms of worsening disease encouraged  Follow Up Plan: Telephone follow up appointment with care management team member scheduled for:11-24-2020 at 230 pm    Care Plan : RNCM: Management of HTN  Updates made by Vanita Ingles, RN since 11/24/2020 12:00 AM  Completed 11/24/2020   Problem: RNCM: Hypertension (Hypertension) Resolved 11/24/2020  Priority: Medium     Long-Range Goal: RNCM: Hypertension Monitored Completed 11/24/2020  Start Date: 03/13/2020  Expected End Date: 07/23/2021  Recent Progress: On track  Priority: Medium  Note:   Objective: Duplicate, resolving goal Last practice recorded BP readings:  BP Readings from Last 3 Encounters:  10/16/20 129/62  10/08/20 108/65  04/16/20 110/65   Most recent eGFR/CrCl: No results found for: EGFR  No components found for: CRCL Current Barriers:  Knowledge Deficits related to basic understanding of hypertension pathophysiology and self care management Knowledge Deficits related to understanding of medications prescribed for management of hypertension Unable to independently manage HTN Does not contact provider office for questions/concerns Case Manager Clinical Goal(s):  patient will verbalize understanding of plan for hypertension management patient will attend all scheduled medical appointments: 12-31-2020 at 220 pm patient will demonstrate improved adherence to prescribed treatment plan for hypertension as evidenced by taking all medications as prescribed, monitoring and recording blood pressure as directed, adhering to low sodium/DASH diet patient will demonstrate improved health management independence as evidenced by checking blood  pressure as directed and notifying PCP if SBP>160 or DBP > 90, taking all medications as prescribe, and adhering to a low sodium diet as discussed. patient will verbalize basic understanding of hypertension disease process and self health management plan as evidenced by compliance with heart healthy diet, compliance with medications and working with the CCM team to optimize health and well being.  Interventions:  Collaboration with Olin Hauser, DO regarding development and update of comprehensive plan of care as evidenced by provider attestation and co-signature Inter-disciplinary care team collaboration (see longitudinal plan of care) Evaluation of current treatment plan related to hypertension self management and patient's adherence to plan as established by provider. 09-29-2020: The patient is doing well. Denies any new issues with HTN. States that he is going on a diet because he needs to lose some weight. Feels that this will really help him. Education and support given. 10-16-2020: The patient had a fall on 10-07-2020 and was experiencing some dizziness. The patient may h ave had some orthostatic hypotension. Blood pressure in office today when getting flu vaccine was 129/62. Education given to the patient wife for monitoring for changes in blood pressure,  taking and recording blood pressures.  Provided education to patient re: stroke prevention, s/s of heart attack and stroke, DASH diet, complications of uncontrolled blood pressure Reviewed medications with patient and discussed importance of compliance. 10-16-2020: States compliance with medications. Denies any needs related to his HTN medications.  Discussed plans with patient for ongoing care management follow up and provided patient with direct contact information for care management team Advised patient, providing education and rationale, to monitor blood pressure daily and record, calling PCP for findings outside established  parameters.  Reviewed scheduled/upcoming provider appointments including: 12-31-2020 at 220 pm Self-Care Activities: - Self administers medications as prescribed Attends all scheduled provider appointments Calls provider office for new concerns, questions, or BP outside discussed parameters Checks BP and records as discussed Follows a low sodium diet/DASH diet Patient Goals: - check blood pressure weekly - choose a place to take my blood pressure (home, clinic or office, retail store) - write blood pressure results in a log or diary - agree on reward when goals are met - agree to work together to make changes - ask questions to understand - have a family meeting to talk about healthy habits - learn about high blood pressure  Follow Up Plan: Telephone follow up appointment with care management team member scheduled for:11-24-2020 at 230 pm    Care Plan : RNCM: Management of HLD  Updates made by Vanita Ingles, RN since 11/24/2020 12:00 AM  Completed 11/24/2020   Problem: RNCM: [place] or Disease Self-Management (General Plan of Care) Resolved 11/24/2020  Priority: Medium     Long-Range Goal: RNCM;HLD Self-Management Plan Developed Completed 11/24/2020  Start Date: 03/13/2020  Expected End Date: 07/23/2021  Recent Progress: On track  Priority: Medium  Note:   Current Barriers: Resolving, duplicate goal  Poorly controlled hyperlipidemia, complicated by OSA,HTN, CAD, HLD, CVA Current antihyperlipidemic regimen: Atorvastatin 80 mg QD Most recent lipid panel:     Component Value Date/Time   CHOL 127 10/12/2019 0810   TRIG 123 10/12/2019 0810   HDL 41 10/12/2019 0810   CHOLHDL 3.1 10/12/2019 0810   VLDL 25 07/24/2018 0458   LDLCALC 66 10/12/2019 0810   ASCVD risk enhancing conditions: age >55, HTN, CKD, CHF Unable to independently HLD Does not contact provider office for questions/concerns RN Care Manager Clinical Goal(s):  patient will work with Consulting civil engineer, providers, and care team towards execution of optimized self-health management plan patient will verbalize understanding of plan for effective management of HLD patient will work with [facility] and pcp  to address needs related to management of HLD  patient will demonstrate a decrease in HLD  exacerbations patient will take all medications exactly as prescribed and will call provider for medication related questions patient will verbalize basic understanding of HLD disease process and self health management plan patient will demonstrate understanding of rationale for each prescribed medication the patient will demonstrate ongoing self health care management ability Interventions: Collaboration with Olin Hauser, DO regarding development and update of comprehensive plan of care as evidenced by provider attestation and co-signature Inter-disciplinary care team collaboration (see longitudinal plan of care) Medication review performed; medication list updated in electronic medical record.  Inter-disciplinary care team collaboration (see longitudinal plan of care) Referred to pharmacy team for assistance with HLD medication management Evaluation of current treatment plan related to HLD  and patient's adherence to plan as established by provider. 10-16-2020: The patient is doing well with management of HLD and heart health. Saw specialist  recently and no changes in the plan of care.  Will have blood work on 11-07-2020. The patient states he is doing well and is happy that he is exercising more. Will continue to monitor.  Advised patient to call the office for changes in condition, questions, or concerns Reviewed medications with patient and discussed compliance Reviewed scheduled/upcoming provider appointments including: 12-31-2020 at 220 pm Discussed plans with patient for ongoing care management follow up and provided patient with direct contact information for care management  team Patient Goals/Self-Care Activities: - call for medicine refill 2 or 3 days before it runs out - call if I am sick and can't take my medicine - keep a list of all the medicines I take; vitamins and herbals too - learn to read medicine labels - use a pillbox to sort medicine - use an alarm clock or phone to remind me to take my medicine - change to whole grain breads, cereal, pasta - drink 6 to 8 glasses of water each day - eat 3 to 5 servings of fruits and vegetables each day - eat 5 or 6 small meals each day - eat fish at least once per week - fill half the plate with nonstarchy vegetables - limit fast food meals to no more than 1 per week - manage portion size - prepare main meal at home 3 to 5 days each week - read food labels for fat, fiber, carbohydrates and portion size - be open to making changes - I can manage, know and watch for signs of a heart attack - if I have chest pain, call for help - learn about small changes that will make a big difference - learn my personal risk factors  Follow Up Plan: Telephone follow up appointment with care management team member scheduled for: 11-24-2020 230 pm      Care Plan : RNCM: Fall Risk (Adult)  Updates made by Vanita Ingles, RN since 11/24/2020 12:00 AM  Completed 11/24/2020   Problem: RNCM: Fall Risk Resolved 11/24/2020  Priority: High     Long-Range Goal: RNCM: Absence of Fall and Fall-Related Injury Completed 11/24/2020  Start Date: 02/07/2020  Expected End Date: 07/23/2021  Recent Progress: Not on track  Priority: High  Note:   Current Barriers: Resolving, duplicate goal  Knowledge Deficits related to fall precautions in patient with multiple chronic conditions Decreased adherence to prescribed treatment for fall prevention Lacks social connections Does not contact provider office for questions/concerns Does not call office and report new falls when they occur Knowledge Deficits related to how to obtain handicap  sticker to use with his car. 05-22-2020: Has handicap sticker for his care and states it is very helpful Chronic Disease Management support and education needs related to falls prevention and safety in patient with frequent falls due to unsteady gain and impaired mobility Lacks caregiver support.  Clinical Goal(s):  patient will demonstrate improved adherence to prescribed treatment plan for decreasing falls as evidenced by patient reporting and review of EMR patient will verbalize using fall risk reduction strategies discussed patient will not experience additional falls patient will verbalize understanding of plan for fall prevention and safety in the home patient will work with pcp, [facility] staff to address needs related to getting paperwork for handicap sticker for the patients care- 05-22-2020: Completed and done.  patient will attend all scheduled medical appointments: No upcoming appointments. Knows to call the office for changes Interventions:  Collaboration with Olin Hauser, DO regarding development and update of  comprehensive plan of care as evidenced by provider attestation and co-signature Inter-disciplinary care team collaboration (see longitudinal plan of care) Provided written and verbal education re: Potential causes of falls and Fall prevention strategies Reviewed medications and discussed potential side effects of medications such as dizziness and frequent urination. 05-22-2020: Denies any issues at this time with medications, dizziness. Does take Lasix that causes frequent urination. Review and education done. 07-28-2020: Denies any issues with medications causing dizziness, light headedness or issues related to his fall incidences. Will continue to monitor. 09-29-2020: Denies any falls at this time. The patient states that he is doing well. Is doing more exercises and feels this is helping him with his strength and ability to walk better. Will continue to monitor. 10-16-2020:  the patient had a new fall on 10-07-2020 and went to the ER on 10-08-2020 to be evaluated. 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 The patients wife tried to get him to go when her fell on the 27th but he got the bleeding to stop and would not go. Wanted to wait and see pcp. PCP advised that he go to the ER and have scans. Review with the patients wife the need to be safe and prevent falls. Fall prevention strategies reviewed.  Assessed for s/s of orthostatic hypotension. 09-29-2020: The patient denies any issues with blood pressure drops Assessed for falls since last encounter. 05-22-2020: The patient has had falls since March and had 2 "near falls" yesterday. States that the reason for falls is one of his toes is growing into the other toe and it is causing him to loose his balance. The patient states that he needs to be seen by a podiatrist. Is considering going to [facility] for this care, but did ask for recommendations by Dr. Parks Ranger. Will collaborate with pcp for recommendations on podiatrist. 07-28-2020: The patient states he had a fall 3 or 4 days ago when he was carrying a pot of soup out to the car and he looked away at his dog and then mis-stepped. He hit his knees but saved the pot of soup.  The patient states that he did not hit his head or fracture anything. Has places on bilateral knees and a bruise to his hip where the key fob was. He denies any acute distress. Reviewed with the patient safety and monitoring for changes in his environment. Will continue to monitor for changes. 09-29-2020: No new falls at this time. Will continue to monitor. 10-16-2020: Fall on 10-07-2020 with injury. Evaluated in the ER and reported to the pcp.  Assessed patients knowledge of fall risk prevention secondary to previously provided education. 07-28-2020: Reviewed today wit the patient. Discussed the concern of increase risk of fractures or head injuries with frequent  falls. The patient states he is trying to be safe. He does not use a cane or a walker. Encouraged the patient to consider using DME when ambutlating. 09-29-2020: The patient states he is doing better and denies any new concerns. The patient states that he is working out more and feels safe. 10-16-2020: The patients wife states that they went to pcp office today to get their flu vaccines and that they took his blood pressure while there. The patients blood pressure was 129/62. Discussed orthostatic hypotension and changing position slowly.  Assessed working status of life alert bracelet and patient adherence Provided patient information for fall alert systems Evaluation of current treatment plan related to  falls prevention and safety  and patient's adherence to plan as established by provider. 07-28-2020: Education and support given on fall prevention and safety concerns as the patient has an impaired toe that is causing him to have an unsteady gait. Will collaborate with pcp for recommendations. Review of being safe in his environment. 10-16-2020: No new changes in condition. Will continue to monitor.  Advised patient to call the office for new falls, seek emergent help if needed for falls with injury and work with [facility] for fall prevention and safety  Provided education to patient re: being safe and monitoring for potential high fall risk areas, using DME to help with falls, reporting falls.  Reviewed scheduled/upcoming provider appointments including: No upcoming appointments noted. Knows to call for changes, encouraged patient to call to get an appointment with podiatrist. The patient does not wish to return to previous podiatrist.  Discussed plans with patient for ongoing care management follow up and provided patient with direct contact information for care management team Self-Care Deficits:  Unable to independently manage falls as evidence of fall last week when leaving a restaurant. 05-22-2020: Has had  reoccurrence of falls since last outreach in March. 9-19-202: No new falls at this time. Will continue to monitor. 10-16-2020: New reported fall on 10-07-2020 Lacks social connections Does not contact provider office for questions/concerns Patient Goals:  - Utilize cane (assistive device) appropriately with all ambulation- states he has misplaced his cane but is actively looking for it. 09-29-2020: Review of use of DME when ambulating. The patient denies using a cane or a walker. Will consider. 10-16-2020: Discussed with the patients wife to encourage use of DME when walking.  - De-clutter walkways - Change positions slowly - Wear secure fitting shoes at all times with ambulation - Utilize home lighting for dim lit areas - Demonstrate self and pet awareness at all times - activities of daily living skills assessed - assistive or adaptive device use encouraged - barriers to physical activity or exercise addressed - barriers to physical activity or exercise identified - barriers to safety identified - cognition assessed - cognitive-stimulating activities promoted - fall prevention plan reviewed and updated - fear of falling, loss of independence and pain acknowledged - medication list reviewed - modification of home and work environment promoted Follow Up Plan: Telephone follow up appointment with care management team member scheduled for: 11-24-2020 at 230 pm    Care Plan : RNCM: General Plan of Care (Adult) for Chronic Disease Management and Care Coordination Needs  Updates made by Vanita Ingles, RN since 11/24/2020 12:00 AM     Problem: RNCM: Development of Plan of Care For Chronic Disease Management (HF, HTN, HLD, Depression, Falls)   Priority: High     Long-Range Goal: RNCM: Effective Management of Plan of Care For Chronic Disease Management (HF, HTN, HLD, Depression, Falls)   Start Date: 11/24/2020  Expected End Date: 11/24/2021  Priority: High  Note:   Current Barriers:   Knowledge Deficits related to plan of care for management of CHF, Depression: depressed mood, and falls prevention and safety   Chronic Disease Management support and education needs related to CHF, HTN, HLD, Depression: depressed mood, and falls and safety  RNCM Clinical Goal(s):  Patient will verbalize understanding of plan for management of CHF, HTN, HLD, Depression, and falls and safety as evidenced by following the plan of care, compliance with medications, compliance with dietary restrictions  take all medications exactly as prescribed and will call provider for medication related  questions as evidenced by compliance with medications and calling for needed refills before running out    attend all scheduled medical appointments: 12-31-2020 at 220 pm as evidenced by keeping appointments and calling if appointment needs to be rescheduled        demonstrate improved and ongoing adherence to prescribed treatment plan for CHF, HTN, HLD, Depression, and Falls and safety preventions as evidenced by working with the CCM team and pcp to effectively manage chronic conditions  demonstrate a decrease in CHF, HTN, HLD, Depression, and falls and safety exacerbations  as evidenced by stable conditions, calling for changes, and working with the CCM team to optimize health and well being demonstrate ongoing self health care management ability effective management of chronic conditions as evidenced by  working with the CCM team through collaboration with Consulting civil engineer, provider, and care team.   Interventions: 1:1 collaboration with primary care provider regarding development and update of comprehensive plan of care as evidenced by provider attestation and co-signature Inter-disciplinary care team collaboration (see longitudinal plan of care) Evaluation of current treatment plan related to  self management and patient's adherence to plan as established by provider   SDOH Barriers (Status: Goal on Track  (progressing): YES.) Long Term Goal  Patient interviewed and SDOH assessment performed        Patient interviewed and appropriate assessments performed Provided patient with information about resources available and the care guides being available to assist with changes or new needs Discussed plans with patient for ongoing care management follow up and provided patient with direct contact information for care management team Advised patient to call for changes in SDOH, questions and concerns    Heart Failure Interventions:  (Status: Goal on Track (progressing): YES.)  Long Term Goal  Basic overview and discussion of pathophysiology of Heart Failure reviewed Provided education on low sodium diet Reviewed Heart Failure Action Plan in depth and provided written copy Assessed need for readable accurate scales in home Provided education about placing scale on hard, flat surface Advised patient to weigh each morning after emptying bladder Discussed importance of daily weight and advised patient to weigh and record daily Reviewed role of diuretics in prevention of fluid overload and management of heart failure Discussed the importance of keeping all appointments with provider Provided patient with education about the role of exercise in the management of heart failure Advised patient to discuss new changes, edema more than usual with provider  Depression   (Status: Goal on Track (progressing): YES.) Long Term Goal  Evaluation of current treatment plan related to Depression, Mental Health Concerns  self-management and patient's adherence to plan as established by provider. Discussed plans with patient for ongoing care management follow up and provided patient with direct contact information for care management team Advised patient to call for changes in mood, anxiety, depression; Provided education to patient re: staying positive, being around positive people, and reporting new changes in  depression; Reviewed medications with patient and discussed compliance; Reviewed scheduled/upcoming provider appointments including 12-31-2020; Discussed plans with patient for ongoing care management follow up and provided patient with direct contact information for care management team; Advised patient to discuss changes in depression with provider; Screening for signs and symptoms of depression related to chronic disease state;  Assessed social determinant of health barriers;   Hyperlipidemia:  (Status: Goal on Track (progressing): YES.) Long Term Goal  Lab Results  Component Value Date   CHOL 127 10/12/2019   HDL 41 10/12/2019   [place]  66 10/12/2019   TRIG 123 10/12/2019   CHOLHDL 3.1 10/12/2019     Medication review performed; medication list updated in electronic medical record.  Provider established cholesterol goals reviewed; Counseled on importance of regular laboratory monitoring as prescribed; Provided HLD educational materials; Reviewed role and benefits of statin for ASCVD risk reduction; Discussed strategies to manage statin-induced myalgias; Reviewed importance of limiting foods high in cholesterol; Reviewed exercise goals and target of 150 minutes per week; Screening for signs and symptoms of depression related to chronic disease state;  Assessed social determinant of health barriers;   Hypertension: (Status: Goal on Track (progressing): YES.) Last practice recorded BP readings:  BP Readings from Last 3 Encounters:  10/16/20 129/62  10/08/20 108/65  04/16/20 110/65  Most recent eGFR/CrCl: No results found for: EGFR  No components found for: CRCL  Evaluation of current treatment plan related to hypertension self management and patient's adherence to plan as established by provider;   Provided education to patient re: stroke prevention, s/s of heart attack and stroke; Reviewed prescribed diet heart healthy Reviewed medications with patient and discussed  importance of compliance;  Discussed plans with patient for ongoing care management follow up and provided patient with direct contact information for care management team; Advised patient, providing education and rationale, to monitor blood pressure daily and record, calling PCP for findings outside established parameters;  Advised patient to discuss changes in blood pressure trends with provider; Provided education on prescribed diet heart healthy;  Discussed complications of poorly controlled blood pressure such as heart disease, stroke, circulatory complications, vision complications, kidney impairment, sexual dysfunction;    Falls:  (Status: Goal on Track (progressing): YES.) Long Term Goal  Provided written and verbal education re: potential causes of falls and Fall prevention strategies Reviewed medications and discussed potential side effects of medications such as dizziness and frequent urination Advised patient of importance of notifying provider of falls Assessed for signs and symptoms of orthostatic hypotension Assessed for falls since last encounter. 11-24-2020: last reported fall at 10-07-2020 Assessed patients knowledge of fall risk prevention secondary to previously provided education Provided patient information for fall alert systems Assessed working status of life alert bracelet and patient adherence Advised patient to discuss new falls with provider   Patient Goals/Self-Care Activities: Patient will self administer medications as prescribed as evidenced by self report/primary caregiver report  Patient will attend all scheduled provider appointments as evidenced by clinician review of documented attendance to scheduled appointments and patient/caregiver report Patient will call pharmacy for medication refills as evidenced by patient report and review of pharmacy fill history as appropriate Patient will attend church or other social activities as evidenced by patient  report Patient will continue to perform ADL's independently as evidenced by patient/caregiver report Patient will continue to perform IADL's independently as evidenced by patient/caregiver report Patient will call provider office for new concerns or questions as evidenced by review of documented incoming telephone call notes and patient report Patient will work with BSW to address care coordination needs and will continue to work with the clinical team to address health care and disease management related needs as evidenced by documented adherence to scheduled care management/care coordination appointments call office if I gain more than 2 pounds in one day or 5 pounds in one week keep legs up while sitting track weight in diary use salt in moderation watch for swelling in feet, ankles and legs every day weigh myself daily develop a rescue plan follow rescue plan if symptoms flare-up  eat more whole grains, fruits and vegetables, lean meats and healthy fats know when to call the doctorfor changes in sx and sx of HF exacerbation  track symptoms and what helps feel better or worse dress right for the weather, hot or cold - check blood pressure weekly - choose a place to take my blood pressure (home, clinic or office, retail store) - write blood pressure results in a log or diary - learn about high blood pressure - keep a blood pressure log - take blood pressure log to all doctor appointments - call doctor for signs and symptoms of high blood pressure - develop an action plan for high blood pressure - keep all doctor appointments - take medications for blood pressure exactly as prescribed - report new symptoms to your doctor - eat more whole grains, fruits and vegetables, lean meats and healthy fats - call for medicine refill 2 or 3 days before it runs out - take all medications exactly as prescribed - call doctor with any symptoms you believe are related to your medicine - call doctor  when you experience any new symptoms - go to all doctor appointments as scheduled - adhere to prescribed diet: heart healthy diet        Plan:Telephone follow up appointment with care management team member scheduled for:  01-26-2021 at 230 pm  [place], MSN, [place] Mobile: [phone]

## 2020-11-24 NOTE — Patient Instructions (Signed)
Visit Information  NCM Clinical Goal(s):  Patient will verbalize understanding of plan for management of CHF, HTN, HLD, Depression, and falls and safety as evidenced by following the plan of care, compliance with medications, compliance with dietary restrictions  take all medications exactly as prescribed and will call provider for medication related questions as evidenced by compliance with medications and calling for needed refills before running out    attend all scheduled medical appointments: 12-31-2020 at 220 pm as evidenced by keeping appointments and calling if appointment needs to be rescheduled        demonstrate improved and ongoing adherence to prescribed treatment plan for CHF, HTN, HLD, Depression, and Falls and safety preventions as evidenced by working with the CCM team and pcp to effectively manage chronic conditions  demonstrate a decrease in CHF, HTN, HLD, Depression, and falls and safety exacerbations  as evidenced by stable conditions, calling for changes, and working with the CCM team to optimize health and well being demonstrate ongoing self health care management ability effective management of chronic conditions as evidenced by  working with the CCM team through collaboration with Consulting civil engineer, provider, and care team.   Interventions: 1:1 collaboration with primary care provider regarding development and update of comprehensive plan of care as evidenced by provider attestation and co-signature Inter-disciplinary care team collaboration (see longitudinal plan of care) Evaluation of current treatment plan related to  self management and patient's adherence to plan as established by provider   SDOH Barriers (Status: Goal on Track (progressing): YES.) Long Term Goal  Patient interviewed and SDOH assessment performed        Patient interviewed and appropriate assessments performed Provided patient with information about resources available and the care guides being  available to assist with changes or new needs Discussed plans with patient for ongoing care management follow up and provided patient with direct contact information for care management team Advised patient to call for changes in SDOH, questions and concerns    Heart Failure Interventions:  (Status: Goal on Track (progressing): YES.)  Long Term Goal  Basic overview and discussion of pathophysiology of Heart Failure reviewed Provided education on low sodium diet Reviewed Heart Failure Action Plan in depth and provided written copy Assessed need for readable accurate scales in home Provided education about placing scale on hard, flat surface Advised patient to weigh each morning after emptying bladder Discussed importance of daily weight and advised patient to weigh and record daily Reviewed role of diuretics in prevention of fluid overload and management of heart failure Discussed the importance of keeping all appointments with provider Provided patient with education about the role of exercise in the management of heart failure Advised patient to discuss new changes, edema more than usual with provider  Depression   (Status: Goal on Track (progressing): YES.) Long Term Goal  Evaluation of current treatment plan related to Depression, Mental Health Concerns  self-management and patient's adherence to plan as established by provider. Discussed plans with patient for ongoing care management follow up and provided patient with direct contact information for care management team Advised patient to call for changes in mood, anxiety, depression; Provided education to patient re: staying positive, being around positive people, and reporting new changes in depression; Reviewed medications with patient and discussed compliance; Reviewed scheduled/upcoming provider appointments including 12-31-2020; Discussed plans with patient for ongoing care management follow up and provided patient with direct  contact information for care management team; Advised patient to discuss changes in depression with  provider; Screening for signs and symptoms of depression related to chronic disease state;  Assessed social determinant of health barriers;   Hyperlipidemia:  (Status: Goal on Track (progressing): YES.) Long Term Goal  Lab Results  Component Value Date   CHOL 127 10/12/2019   HDL 41 10/12/2019   LDLCALC 66 10/12/2019   TRIG 123 10/12/2019   CHOLHDL 3.1 10/12/2019     Medication review performed; medication list updated in electronic medical record.  Provider established cholesterol goals reviewed; Counseled on importance of regular laboratory monitoring as prescribed; Provided HLD educational materials; Reviewed role and benefits of statin for ASCVD risk reduction; Discussed strategies to manage statin-induced myalgias; Reviewed importance of limiting foods high in cholesterol; Reviewed exercise goals and target of 150 minutes per week; Screening for signs and symptoms of depression related to chronic disease state;  Assessed social determinant of health barriers;   Hypertension: (Status: Goal on Track (progressing): YES.) Last practice recorded BP readings:  BP Readings from Last 3 Encounters:  10/16/20 129/62  10/08/20 108/65  04/16/20 110/65  Most recent eGFR/CrCl: No results found for: EGFR  No components found for: CRCL  Evaluation of current treatment plan related to hypertension self management and patient's adherence to plan as established by provider;   Provided education to patient re: stroke prevention, s/s of heart attack and stroke; Reviewed prescribed diet heart healthy Reviewed medications with patient and discussed importance of compliance;  Discussed plans with patient for ongoing care management follow up and provided patient with direct contact information for care management team; Advised patient, providing education and rationale, to monitor blood pressure  daily and record, calling PCP for findings outside established parameters;  Advised patient to discuss changes in blood pressure trends with provider; Provided education on prescribed diet heart healthy;  Discussed complications of poorly controlled blood pressure such as heart disease, stroke, circulatory complications, vision complications, kidney impairment, sexual dysfunction;    Falls:  (Status: Goal on Track (progressing): YES.) Long Term Goal  Provided written and verbal education re: potential causes of falls and Fall prevention strategies Reviewed medications and discussed potential side effects of medications such as dizziness and frequent urination Advised patient of importance of notifying provider of falls Assessed for signs and symptoms of orthostatic hypotension Assessed for falls since last encounter. 11-24-2020: last reported fall at 10-07-2020 Assessed patients knowledge of fall risk prevention secondary to previously provided education Provided patient information for fall alert systems Assessed working status of life alert bracelet and patient adherence Advised patient to discuss new falls with provider   Patient Goals/Self-Care Activities: Patient will self administer medications as prescribed as evidenced by self report/primary caregiver report  Patient will attend all scheduled provider appointments as evidenced by clinician review of documented attendance to scheduled appointments and patient/caregiver report Patient will call pharmacy for medication refills as evidenced by patient report and review of pharmacy fill history as appropriate Patient will attend church or other social activities as evidenced by patient report Patient will continue to perform ADL's independently as evidenced by patient/caregiver report Patient will continue to perform IADL's independently as evidenced by patient/caregiver report Patient will call provider office for new concerns or questions  as evidenced by review of documented incoming telephone call notes and patient report Patient will work with BSW to address care coordination needs and will continue to work with the clinical team to address health care and disease management related needs as evidenced by documented adherence to scheduled care management/care coordination appointments call  office if I gain more than 2 pounds in one day or 5 pounds in one week keep legs up while sitting track weight in diary use salt in moderation watch for swelling in feet, ankles and legs every day weigh myself daily develop a rescue plan follow rescue plan if symptoms flare-up eat more whole grains, fruits and vegetables, lean meats and healthy fats know when to call the doctorfor changes in sx and sx of HF exacerbation  track symptoms and what helps feel better or worse dress right for the weather, hot or cold - check blood pressure weekly - choose a place to take my blood pressure (home, clinic or office, retail store) - write blood pressure results in a log or diary - learn about high blood pressure - keep a blood pressure log - take blood pressure log to all doctor appointments - call doctor for signs and symptoms of high blood pressure - develop an action plan for high blood pressure - keep all doctor appointments - take medications for blood pressure exactly as prescribed - report new symptoms to your doctor - eat more whole grains, fruits and vegetables, lean meats and healthy fats - call for medicine refill 2 or 3 days before it runs out - take all medications exactly as prescribed - call doctor with any symptoms you believe are related to your medicine - call doctor when you experience any new symptoms - go to all doctor appointments as scheduled - adhere to prescribed diet: heart healthy diet   Patient verbalizes understanding of instructions provided today and agrees to view in [place].   Telephone follow up  appointment with care management team member scheduled for: 01-26-2021 at 230 pm  Noreene Larsson RN, MSN, [place] Mobile: [phone]

## 2020-11-26 ENCOUNTER — Ambulatory Visit: Payer: Medicare PPO | Admitting: Pharmacist

## 2020-11-26 DIAGNOSIS — I10 Essential (primary) hypertension: Secondary | ICD-10-CM

## 2020-11-26 DIAGNOSIS — J3089 Other allergic rhinitis: Secondary | ICD-10-CM

## 2020-11-26 NOTE — Chronic Care Management (AMB) (Signed)
Chronic Care Management CCM Pharmacy Note  11/26/2020 Name:  Luke Mckee MRN:  [id number] DOB:  11/03/1940   Subjective: Luke Mckee is an 80 y.o. year old male who is a primary patient of [name], DO.  The CCM team was consulted for assistance with disease management and care coordination needs.    Receive a message from CCM Nurse Case Manager requesting outreach to patient and spouse to address a question about patient's albuterol inhaler  Engaged with patient's wife by telephone for follow up visit for pharmacy case management and/or care coordination services.   Objective:  Medications     Reviewed by [name], RN (Case Manager) on 11/24/20 at 1447  Med List Status: <None>   Medication Order Taking? Sig Documenting Provider Last Dose Status Informant  acetaminophen (TYLENOL) 650 MG CR tablet [id number] No Take 1,300 mg by mouth at bedtime as needed. [provider] Taking Active Spouse/Significant Other  albuterol (VENTOLIN HFA) 108 (90 Base) MCG/ACT inhaler [id number]  Inhale 1-2 puffs into the lungs every 4 (four) hours as needed for wheezing or shortness of breath (cough). [name], DO  Active   aspirin 81 MG chewable tablet [id number] No Chew 81 mg by mouth daily. [provider] Taking Active   atenolol (TENORMIN) 50 MG tablet [id number] No Take 1 tablet (50 mg total) by mouth at bedtime. [name], DO Taking Active   atorvastatin (LIPITOR) 80 MG tablet [id number] No Take 1 tablet (80 mg total) by mouth daily. [name], DO Taking Active   Cholecalciferol (VITAMIN D) 50 MCG (2000 UT) tablet [id number] No Take 4,000 Units by mouth daily. [provider] Taking Active Spouse/Significant Other  clopidogrel (PLAVIX) 75 MG tablet [id number] No TAKE 1 TABLET(75 MG) BY MOUTH DAILY Gollan, Tollie Pizza, MD Taking Active   clotrimazole-betamethasone (LOTRISONE) cream [id number] No Apply twice a day for  1-2 weeks, may repeat if need in future [name], DO Taking Active   co-enzyme Q-10 30 MG capsule [id number] No Take 100 mg by mouth daily. [provider] Taking Active   diclofenac sodium (VOLTAREN) 1 % GEL [id number] No Apply 2 g topically 4 (four) times daily as needed for pain. Foot pain [provider] Taking Active Spouse/Significant Other  docusate sodium (COLACE) 100 MG capsule [id number] No Take 200 mg by mouth daily. [provider] Taking Active Spouse/Significant Other  furosemide (LASIX) 80 MG tablet [id number] No Take 1 tablet (80 mg total) by mouth 2 (two) times daily. [name], DO Taking Active   isosorbide mononitrate (IMDUR) 30 MG 24 hr tablet [id number] No Take 1 tablet (30 mg total) by mouth daily. [name], DO Taking Active   levothyroxine (SYNTHROID) 125 MCG tablet [id number] No Take 1 tablet (125 mcg total) by mouth daily before breakfast. [name], DO Taking Active   loratadine (CLARITIN) 10 MG tablet [id number] No Take 10 mg by mouth daily. [provider] Taking Active   LORazepam (ATIVAN) 0.5 MG tablet [id number] No Take 1 tablet (0.5 mg total) by mouth daily as needed for anxiety or sleep. [name], DO Taking Active   montelukast (SINGULAIR) 10 MG tablet [id number] No Take 1 tablet (10 mg total) by mouth at bedtime. [name], DO Taking Active   Multiple Vitamin (MULTIVITAMIN) tablet [id number] No Take 1 tablet by mouth daily. [provider] Taking Active Spouse/Significant Other  nitroGLYCERIN (NITROSTAT) 0.4 MG SL tablet [id number] No Place 1  tablet (0.4 mg total) under the tongue every 5 (five) minutes as needed for chest pain. [name], DO Taking Active   PATADAY 0.1 % ophthalmic solution [id number] No Place into both eyes as needed.  [provider] Taking Active   senna (SENOKOT) 8.6 MG tablet [id number] No Take 1  tablet by mouth daily. [provider] Taking Active   sertraline (ZOLOFT) 100 MG tablet [id number] No Take 2 tablets (200 mg total) by mouth daily. [name], DO Taking Active   spironolactone (ALDACTONE) 25 MG tablet [id number] No Take 0.5 tablets (12.5 mg total) by mouth daily. [name], DO Taking Active             Pertinent Labs:  BP Readings from Last 3 Encounters:  10/16/20 129/62  10/08/20 108/65  04/16/20 110/65   Pulse Readings from Last 3 Encounters:  10/16/20 65  10/08/20 68  04/16/20 74      SDOH:  (Social Determinants of Health) assessments and interventions performed:    CCM Care Plan  Review of patient past medical history, allergies, medications, health status, including review of consultants reports, laboratory and other test data, was performed as part of comprehensive evaluation and provision of chronic care management services.   Care Plan : PharmD - Med Management  Updates made by Manuela Neptune, RPH-CPP since 11/26/2020 12:00 AM     Problem: Disease Progression      Long-Range Goal: Disease Progression Prevented or Minimized   Start Date: 02/04/2020  Expected End Date: 05/04/2020  This Visit's Progress: On track  Recent Progress: On track  Priority: High  Note:   Current Barriers:  Chronic Disease Management support, education, and care coordination needs related to CAD, HTN, HLD, and hx CVA  Pharmacist Clinical Goal(s):  Over the next 90 days, patient will adhere to plan to optimize therapeutic regimen for hypothyroidism as evidenced by report of adherence to recommended medication management changes through collaboration with PharmD and provider.    Interventions: 1:1 collaboration with [name], DO regarding development and update of comprehensive plan of care as evidenced by provider attestation and co-signature Inter-disciplinary care team collaboration (see longitudinal plan of  care) Receive a message from CCM Nurse Case Manager requesting outreach to patient and spouse to address a question about patient's albuterol inhaler. Reach patient's wife by telephone today. She asks about patient using albuterol inhaler with patient's other medications and monitoring. Perform interaction review based on current medication list and address questions. No significant drug-drug interactions found Counsel spouse about mild tachycardia possible with albuterol, less likely as patient only to trial use as needed and reports only occasionally having shortness of breath, but that may monitor patient's heart rate using home BP monitor  Seasonal allergies: Spouse reports allergies currently well-controlled Current treatment: Loratadine 10 mg daily Montelukast 10 mg QHS  Hypothyroidism Controled; current treatment: levothyroxine 125 mcg daily before breakfast Reports patient continues to levothyroxine consistently in the morning on an empty stomach, at least 30 minutes before food and at least 4 hours from calcium- or iron-containing products using new weekly pill organizer  Hypertension Controlled; current treatment: Atenolol 50 mg daily at bedtime Furosemide 80 mg twice daily Isosorbide ER 30 mg once daily Spironolactone 25 mg - 1/2 tablet (12.5 mg) once daily Denies monitoring home blood pressure recently Encourage to monitor home BP 1-2 times/week, keep log of results and call providers for readings outside of established parameters.   Patient Goals/Self-Care Activities Over the  next 90 days, patient will:  - take medications as prescribed with assistance of wife   Note: patient uses weekly pillbox as filled by wife - check blood pressure, document, and provide at future appointments  Next appointment with PCP on 12/21 (lab work on 12/14)  Follow Up Plan: Telephone follow up appointment with care management team member scheduled for: 01/21/2021 at 11 am       Estelle Grumbles, PharmD, [place], CPP Clinical Pharmacist [facility] [phone]

## 2020-11-26 NOTE — Patient Instructions (Signed)
Visit Information  Thank you for taking time to visit with me today. Please don't hesitate to contact me if I can be of assistance to you before our next scheduled telephone appointment.  Telephone follow up appointment with care management team member scheduled for: 01/21/2021 at 11 am  If you need to cancel or re-schedule our visit, please call [phone] and our care guide team will be happy to assist you.  Following is a list of the goals we discussed today:    Please check your blood pressure 1-2 times/week at home and keep a record to bring to medical appointments   Estelle Grumbles, PharmD, BCACP Clinical Pharmacist [facility] [phone]  The patient verbalized understanding of instructions, educational materials, and care plan provided today and declined offer to receive copy of patient instructions, educational materials, and care plan.

## 2020-11-27 ENCOUNTER — Ambulatory Visit: Payer: Self-pay

## 2020-11-27 DIAGNOSIS — E782 Mixed hyperlipidemia: Secondary | ICD-10-CM

## 2020-11-27 DIAGNOSIS — F3341 Major depressive disorder, recurrent, in partial remission: Secondary | ICD-10-CM

## 2020-11-27 DIAGNOSIS — L989 Disorder of the skin and subcutaneous tissue, unspecified: Secondary | ICD-10-CM

## 2020-11-27 NOTE — Patient Instructions (Addendum)
Visit Information  Thank you for taking time to visit with me today. Please don't hesitate to contact me if I can be of assistance to you before our next scheduled telephone appointment.  The care management team will reach out to the patient again over the next 30 to 60 days.   If you need to cancel or re-schedule our visit, please call [phone] and our care guide team will be happy to assist you.  Following is a list of the goals we discussed today:  11-27-2020: Call made back to the patient and spoke to the patients wife [name], the patient has a place on his nose that comes up and goes back down. The patient and wife were asking about referral to dermatologist. Collaboration with the pcp and the patient saw Dr. Gwen Pounds in the summer of 2021 and had places removed. The patient should not need another referral. Information provided to the patient and the patients wife. The number to call and make an appointment is [phone]. Reviewed with the patient and the patients wife and understanding was verbalized.   Patient verbalizes understanding of instructions provided today and agrees to view in MyChart.   Alto Denver RN, MSN, CCM Community Care Coordinator [place]  [facility] [place] Mobile: [phone]

## 2020-11-27 NOTE — Chronic Care Management (AMB) (Signed)
Chronic Care Management   CCM RN Visit Note  11/27/2020 Name: Luke Mckee MRN: [id number] DOB: November 29, 1940  Subjective: Luke Mckee is a 80 y.o. year old male who is a primary care patient of Olin Hauser, DO. The care management team was consulted for assistance with disease management and care coordination needs.    Engaged with patient by telephone for follow up visit in response to provider referral for case management and/or care coordination services.   Consent to Services:  The patient was given information about Chronic Care Management services, agreed to services, and gave verbal consent prior to initiation of services.  Please see initial visit note for detailed documentation.   Patient agreed to services and verbal consent obtained.   Assessment: Review of patient past medical history, allergies, medications, health status, including review of consultants reports, laboratory and other test data, was performed as part of comprehensive evaluation and provision of chronic care management services.   SDOH (Social Determinants of Health) assessments and interventions performed:    CCM Care Plan  Allergies  Allergen Reactions   Ace Inhibitors    Cephalexin     Vomiting and diarrhea   Crestor [Rosuvastatin Calcium] Other (See Comments)    myalgia   Tape Rash    blistering    Outpatient Encounter Medications as of 11/27/2020  Medication Sig   acetaminophen (TYLENOL) 650 MG CR tablet Take 1,300 mg by mouth at bedtime as needed.   albuterol (VENTOLIN HFA) 108 (90 Base) MCG/ACT inhaler Inhale 1-2 puffs into the lungs every 4 (four) hours as needed for wheezing or shortness of breath (cough).   aspirin 81 MG chewable tablet Chew 81 mg by mouth daily.   atenolol (TENORMIN) 50 MG tablet Take 1 tablet (50 mg total) by mouth at bedtime.   atorvastatin (LIPITOR) 80 MG tablet Take 1 tablet (80 mg total) by mouth daily.   Cholecalciferol (VITAMIN D) 50 MCG (2000 UT)  tablet Take 4,000 Units by mouth daily.   clopidogrel (PLAVIX) 75 MG tablet TAKE 1 TABLET(75 MG) BY MOUTH DAILY   clotrimazole-betamethasone (LOTRISONE) cream Apply twice a day for 1-2 weeks, may repeat if need in future   co-enzyme Q-10 30 MG capsule Take 100 mg by mouth daily.   diclofenac sodium (VOLTAREN) 1 % GEL Apply 2 g topically 4 (four) times daily as needed for pain. Foot pain   docusate sodium (COLACE) 100 MG capsule Take 200 mg by mouth daily.   furosemide (LASIX) 80 MG tablet Take 1 tablet (80 mg total) by mouth 2 (two) times daily.   isosorbide mononitrate (IMDUR) 30 MG 24 hr tablet Take 1 tablet (30 mg total) by mouth daily.   levothyroxine (SYNTHROID) 125 MCG tablet Take 1 tablet (125 mcg total) by mouth daily before breakfast.   loratadine (CLARITIN) 10 MG tablet Take 10 mg by mouth daily.   LORazepam (ATIVAN) 0.5 MG tablet Take 1 tablet (0.5 mg total) by mouth daily as needed for anxiety or sleep.   montelukast (SINGULAIR) 10 MG tablet Take 1 tablet (10 mg total) by mouth at bedtime.   Multiple Vitamin (MULTIVITAMIN) tablet Take 1 tablet by mouth daily.   nitroGLYCERIN (NITROSTAT) 0.4 MG SL tablet Place 1 tablet (0.4 mg total) under the tongue every 5 (five) minutes as needed for chest pain.   PATADAY 0.1 % ophthalmic solution Place into both eyes as needed.    senna (SENOKOT) 8.6 MG tablet Take 1 tablet by mouth daily.   sertraline (ZOLOFT) 100  MG tablet Take 2 tablets (200 mg total) by mouth daily.   spironolactone (ALDACTONE) 25 MG tablet Take 0.5 tablets (12.5 mg total) by mouth daily.   No facility-administered encounter medications on file as of 11/27/2020.    Patient Active Problem List   Diagnosis Date Noted   Hallux limitus, acquired, right 11/12/2019   Pre-diabetes 10/17/2019   Recurrent major depressive disorder, in partial remission ([place]) 04/16/2019   Morbid obesity ([place]) 04/16/2019   Vasomotor rhinitis 10/25/2018   Hearing loss 08/28/2018   Osteoarthritis  08/28/2018   Tinnitus 08/28/2018   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 ([place]) 07/31/2018   Stroke ([place]) 07/24/2018   TIA (transient ischemic attack) 07/23/2018   Slurred speech 07/23/2018   Right arm weakness 07/23/2018   Hypokalemia 07/23/2018   Anxiety 07/20/2018   Atherosclerosis of native coronary artery of native heart with stable angina pectoris ([place]) 06/16/2018   Hx of CABG 06/16/2018   Mixed hyperlipidemia 06/16/2018   Benign essential HTN 06/16/2018   History of cerebrovascular accident (CVA) with residual deficit 27/74/1287   Systolic CHF ([place]) 86/76/7209   Hypertension 02/15/2018   S/P total knee arthroplasty, right 02/15/2018   Primary osteoarthritis of both knees 11/25/2017   Abnormal nuclear stress test 06/30/2016   Hallux limitus of right foot 10/22/2015   Anesthesia complication 47/09/6281   GERD (gastroesophageal reflux disease) 10/10/2015   Hypothyroidism 10/10/2015   OSA on CPAP 01/10/2007   Diagnosis unknown 01/12/2003    Conditions to be addressed/monitored:HLD, Depression, and skin lesion on nose  Care Plan : RNCM: General Plan of Care (Adult) for Chronic Disease Management and Care Coordination Needs  Updates made by Vanita Ingles, RN since 11/27/2020 12:00 AM     Problem: RNCM: Development of Plan of Care For Chronic Disease Management (HF, HTN, HLD, Depression, Falls)   Priority: High     Long-Range Goal: RNCM: Effective Management of Plan of Care For Chronic Disease Management (HF, HTN, HLD, Depression, Falls)   Start Date: 11/24/2020  Expected End Date: 11/24/2021  Priority: High  Note:   Current Barriers:  Knowledge Deficits related to plan of care for management of CHF, Depression: depressed mood, and falls prevention and safety   Chronic Disease Management support and education needs related to CHF, HTN, HLD, Depression: depressed mood, and falls and safety  RNCM Clinical Goal(s):  Patient will verbalize  understanding of plan for management of CHF, HTN, HLD, Depression, and falls and safety as evidenced by following the plan of care, compliance with medications, compliance with dietary restrictions  take all medications exactly as prescribed and will call provider for medication related questions as evidenced by compliance with medications and calling for needed refills before running out    attend all scheduled medical appointments: 12-31-2020 at 220 pm as evidenced by keeping appointments and calling if appointment needs to be rescheduled        demonstrate improved and ongoing adherence to prescribed treatment plan for CHF, HTN, HLD, Depression, and Falls and safety preventions as evidenced by working with the CCM team and pcp to effectively manage chronic conditions  demonstrate a decrease in CHF, HTN, HLD, Depression, and falls and safety exacerbations  as evidenced by stable conditions, calling for changes, and working with the CCM team to optimize health and well being demonstrate ongoing self health care management ability effective management of chronic conditions as evidenced by  working with the CCM team through collaboration with Consulting civil engineer, provider, and care  team.   Interventions: 1:1 collaboration with primary care provider regarding development and update of comprehensive plan of care as evidenced by provider attestation and co-signature Inter-disciplinary care team collaboration (see longitudinal plan of care) Evaluation of current treatment plan related to  self management and patient's adherence to plan as established by provider   SDOH Barriers (Status: Goal on Track (progressing): YES.) Long Term Goal  Patient interviewed and SDOH assessment performed        Patient interviewed and appropriate assessments performed Provided patient with information about resources available and the care guides being available to assist with changes or new needs Discussed plans with patient  for ongoing care management follow up and provided patient with direct contact information for care management team Advised patient to call for changes in SDOH, questions and concerns    Heart Failure Interventions:  (Status: Goal on Track (progressing): YES.)  Long Term Goal  Basic overview and discussion of pathophysiology of Heart Failure reviewed Provided education on low sodium diet Reviewed Heart Failure Action Plan in depth and provided written copy Assessed need for readable accurate scales in home Provided education about placing scale on hard, flat surface Advised patient to weigh each morning after emptying bladder Discussed importance of daily weight and advised patient to weigh and record daily Reviewed role of diuretics in prevention of fluid overload and management of heart failure Discussed the importance of keeping all appointments with provider Provided patient with education about the role of exercise in the management of heart failure Advised patient to discuss new changes, edema more than usual with provider  Depression   (Status: Goal on Track (progressing): YES.) Long Term Goal  Evaluation of current treatment plan related to Depression, Mental Health Concerns  self-management and patient's adherence to plan as established by provider. Discussed plans with patient for ongoing care management follow up and provided patient with direct contact information for care management team Advised patient to call for changes in mood, anxiety, depression; Provided education to patient re: staying positive, being around positive people, and reporting new changes in depression; Reviewed medications with patient and discussed compliance; Reviewed scheduled/upcoming provider appointments including 12-31-2020; Discussed plans with patient for ongoing care management follow up and provided patient with direct contact information for care management team; Advised patient to discuss  changes in depression with provider; Screening for signs and symptoms of depression related to chronic disease state;  Assessed social determinant of health barriers;  11-27-2020: Call made back to the patient and spoke to the patients wife Arbie Cookey, the patient has a place on his nose that comes up and goes back down. The patient and wife were asking about referral to dermatologist. Collaboration with the pcp and the patient saw Dr. Nehemiah Massed in the summer of 2021 and had places removed. The patient should not need another referral. Information provided to the patient and the patients wife. The number to call and make an appointment is [phone]. Reviewed with the patient and the patients wife and understanding was verbalized.   Hyperlipidemia:  (Status: Goal on Track (progressing): YES.) Long Term Goal  Lab Results  Component Value Date   CHOL 127 10/12/2019   HDL 41 10/12/2019   LDLCALC 66 10/12/2019   TRIG 123 10/12/2019   CHOLHDL 3.1 10/12/2019     Medication review performed; medication list updated in electronic medical record.  Provider established cholesterol goals reviewed; Counseled on importance of regular laboratory monitoring as prescribed; Provided HLD educational materials; Reviewed role and benefits  of statin for ASCVD risk reduction; Discussed strategies to manage statin-induced myalgias; Reviewed importance of limiting foods high in cholesterol; Reviewed exercise goals and target of 150 minutes per week; Screening for signs and symptoms of depression related to chronic disease state;  Assessed social determinant of health barriers;  Review of pharm D notes and outreach on 11-26-2020 for medication questions the patients wife had with interactions and making sure what the patient was taking was safe.   Hypertension: (Status: Goal on Track (progressing): YES.) Last practice recorded BP readings:  BP Readings from Last 3 Encounters:  10/16/20 129/62  10/08/20 108/65   04/16/20 110/65  Most recent eGFR/CrCl: No results found for: EGFR  No components found for: CRCL  Evaluation of current treatment plan related to hypertension self management and patient's adherence to plan as established by provider;   Provided education to patient re: stroke prevention, s/s of heart attack and stroke; Reviewed prescribed diet heart healthy Reviewed medications with patient and discussed importance of compliance;  Discussed plans with patient for ongoing care management follow up and provided patient with direct contact information for care management team; Advised patient, providing education and rationale, to monitor blood pressure daily and record, calling PCP for findings outside established parameters;  Advised patient to discuss changes in blood pressure trends with provider; Provided education on prescribed diet heart healthy;  Discussed complications of poorly controlled blood pressure such as heart disease, stroke, circulatory complications, vision complications, kidney impairment, sexual dysfunction;    Falls:  (Status: Goal on Track (progressing): YES.) Long Term Goal  Provided written and verbal education re: potential causes of falls and Fall prevention strategies Reviewed medications and discussed potential side effects of medications such as dizziness and frequent urination Advised patient of importance of notifying provider of falls Assessed for signs and symptoms of orthostatic hypotension Assessed for falls since last encounter. 11-24-2020: last reported fall at 10-07-2020 Assessed patients knowledge of fall risk prevention secondary to previously provided education Provided patient information for fall alert systems Assessed working status of life alert bracelet and patient adherence Advised patient to discuss new falls with provider   Patient Goals/Self-Care Activities: Patient will self administer medications as prescribed as evidenced by self  report/primary caregiver report  Patient will attend all scheduled provider appointments as evidenced by clinician review of documented attendance to scheduled appointments and patient/caregiver report Patient will call pharmacy for medication refills as evidenced by patient report and review of pharmacy fill history as appropriate Patient will attend church or other social activities as evidenced by patient report Patient will continue to perform ADL's independently as evidenced by patient/caregiver report Patient will continue to perform IADL's independently as evidenced by patient/caregiver report Patient will call provider office for new concerns or questions as evidenced by review of documented incoming telephone call notes and patient report Patient will work with BSW to address care coordination needs and will continue to work with the clinical team to address health care and disease management related needs as evidenced by documented adherence to scheduled care management/care coordination appointments call office if I gain more than 2 pounds in one day or 5 pounds in one week keep legs up while sitting track weight in diary use salt in moderation watch for swelling in feet, ankles and legs every day weigh myself daily develop a rescue plan follow rescue plan if symptoms flare-up eat more whole grains, fruits and vegetables, lean meats and healthy fats know when to call the doctorfor changes in  sx and sx of HF exacerbation  track symptoms and what helps feel better or worse dress right for the weather, hot or cold - check blood pressure weekly - choose a place to take my blood pressure (home, clinic or office, retail store) - write blood pressure results in a log or diary - learn about high blood pressure - keep a blood pressure log - take blood pressure log to all doctor appointments - call doctor for signs and symptoms of high blood pressure - develop an action plan for high  blood pressure - keep all doctor appointments - take medications for blood pressure exactly as prescribed - report new symptoms to your doctor - eat more whole grains, fruits and vegetables, lean meats and healthy fats - call for medicine refill 2 or 3 days before it runs out - take all medications exactly as prescribed - call doctor with any symptoms you believe are related to your medicine - call doctor when you experience any new symptoms - go to all doctor appointments as scheduled - adhere to prescribed diet: heart healthy diet        Plan:The care management team will reach out to the patient again over the next 30 to 60  days.  Noreene Larsson RN, MSN, [place] Mobile: [phone]

## 2020-12-10 DIAGNOSIS — I5022 Chronic systolic (congestive) heart failure: Secondary | ICD-10-CM

## 2020-12-10 DIAGNOSIS — E782 Mixed hyperlipidemia: Secondary | ICD-10-CM | POA: Diagnosis not present

## 2020-12-10 DIAGNOSIS — I110 Hypertensive heart disease with heart failure: Secondary | ICD-10-CM | POA: Diagnosis not present

## 2020-12-10 DIAGNOSIS — F3341 Major depressive disorder, recurrent, in partial remission: Secondary | ICD-10-CM | POA: Diagnosis not present

## 2020-12-18 DIAGNOSIS — H2512 Age-related nuclear cataract, left eye: Secondary | ICD-10-CM | POA: Diagnosis not present

## 2020-12-22 ENCOUNTER — Ambulatory Visit (INDEPENDENT_AMBULATORY_CARE_PROVIDER_SITE_OTHER): Payer: Medicare PPO

## 2020-12-22 DIAGNOSIS — I63412 Cerebral infarction due to embolism of left middle cerebral artery: Secondary | ICD-10-CM

## 2020-12-23 ENCOUNTER — Other Ambulatory Visit: Payer: Self-pay | Admitting: Family Medicine

## 2020-12-23 DIAGNOSIS — Z0000 Encounter for general adult medical examination without abnormal findings: Secondary | ICD-10-CM

## 2020-12-23 DIAGNOSIS — R351 Nocturia: Secondary | ICD-10-CM

## 2020-12-23 DIAGNOSIS — E039 Hypothyroidism, unspecified: Secondary | ICD-10-CM

## 2020-12-23 DIAGNOSIS — I10 Essential (primary) hypertension: Secondary | ICD-10-CM

## 2020-12-23 DIAGNOSIS — R7303 Prediabetes: Secondary | ICD-10-CM

## 2020-12-23 DIAGNOSIS — E782 Mixed hyperlipidemia: Secondary | ICD-10-CM

## 2020-12-23 LAB — CUP PACEART REMOTE DEVICE CHECK
Date Time Interrogation Session: 20221128230554
Implantable Pulse Generator Implant Date: 20200714

## 2020-12-24 ENCOUNTER — Other Ambulatory Visit: Payer: Medicare PPO

## 2020-12-24 DIAGNOSIS — I10 Essential (primary) hypertension: Secondary | ICD-10-CM | POA: Diagnosis not present

## 2020-12-24 DIAGNOSIS — Z0000 Encounter for general adult medical examination without abnormal findings: Secondary | ICD-10-CM | POA: Diagnosis not present

## 2020-12-24 DIAGNOSIS — R351 Nocturia: Secondary | ICD-10-CM | POA: Diagnosis not present

## 2020-12-24 DIAGNOSIS — E039 Hypothyroidism, unspecified: Secondary | ICD-10-CM | POA: Diagnosis not present

## 2020-12-24 DIAGNOSIS — E782 Mixed hyperlipidemia: Secondary | ICD-10-CM | POA: Diagnosis not present

## 2020-12-24 DIAGNOSIS — R7303 Prediabetes: Secondary | ICD-10-CM | POA: Diagnosis not present

## 2020-12-25 LAB — COMPLETE METABOLIC PANEL WITH GFR
AG Ratio: 1.3 (calc) (ref 1.0–2.5)
ALT: 18 U/L (ref 9–46)
AST: 18 U/L (ref 10–35)
Albumin: 4.2 g/dL (ref 3.6–5.1)
Alkaline phosphatase (APISO): 57 U/L (ref 35–144)
BUN: 18 mg/dL (ref 7–25)
CO2: 30 mmol/L (ref 20–32)
Calcium: 9.3 mg/dL (ref 8.6–10.3)
Chloride: 102 mmol/L (ref 98–110)
Creat: 0.93 mg/dL (ref 0.70–1.28)
Globulin: 3.2 g/dL (calc) (ref 1.9–3.7)
Glucose, Bld: 101 mg/dL — ABNORMAL HIGH (ref 65–99)
Potassium: 4.1 mmol/L (ref 3.5–5.3)
Sodium: 141 mmol/L (ref 135–146)
Total Bilirubin: 0.6 mg/dL (ref 0.2–1.2)
Total Protein: 7.4 g/dL (ref 6.1–8.1)
eGFR: 84 mL/min/{1.73_m2} (ref >=60)

## 2020-12-25 LAB — CBC WITH DIFFERENTIAL/PLATELET
Absolute Monocytes: 663 cells/uL (ref 200–950)
Basophils Absolute: 70 cells/uL (ref 0–200)
Basophils Relative: 0.9 %
Eosinophils Absolute: 312 cells/uL (ref 15–500)
Eosinophils Relative: 4 %
HCT: 41.8 % (ref 38.5–50.0)
Hemoglobin: 14 g/dL (ref 13.2–17.1)
Lymphs Abs: 2090 cells/uL (ref 850–3900)
MCH: 31.6 pg (ref 27.0–33.0)
MCHC: 33.5 g/dL (ref 32.0–36.0)
MCV: 94.4 fL (ref 80.0–100.0)
MPV: 9.5 fL (ref 7.5–12.5)
Monocytes Relative: 8.5 %
Neutro Abs: 4664 cells/uL (ref 1500–7800)
Neutrophils Relative %: 59.8 %
Platelets: 231 10*3/uL (ref 140–400)
RBC: 4.43 10*6/uL (ref 4.20–5.80)
RDW: 13.3 % (ref 11.0–15.0)
Total Lymphocyte: 26.8 %
WBC: 7.8 10*3/uL (ref 3.8–10.8)

## 2020-12-25 LAB — T4, FREE: Free T4: 1.5 ng/dL (ref 0.8–1.8)

## 2020-12-25 LAB — HEMOGLOBIN A1C
Hgb A1c MFr Bld: 6.2 % of total Hgb — ABNORMAL HIGH (ref <5.7)
Mean Plasma Glucose: 131 mg/dL
eAG (mmol/L): 7.3 mmol/L

## 2020-12-25 LAB — TSH: TSH: 1.81 mIU/L (ref 0.40–4.50)

## 2020-12-25 LAB — LIPID PANEL
Cholesterol: 130 mg/dL (ref <200)
HDL: 36 mg/dL — ABNORMAL LOW (ref >=40)
LDL Cholesterol (Calc): 71 mg/dL (calc)
Non-HDL Cholesterol (Calc): 94 mg/dL (calc) (ref <130)
Total CHOL/HDL Ratio: 3.6 (calc) (ref <5.0)
Triglycerides: 152 mg/dL — ABNORMAL HIGH (ref <150)

## 2020-12-25 LAB — PSA: PSA: 1.13 ng/mL (ref <=4.00)

## 2020-12-29 DIAGNOSIS — H2512 Age-related nuclear cataract, left eye: Secondary | ICD-10-CM | POA: Diagnosis not present

## 2020-12-30 NOTE — Progress Notes (Signed)
Carelink Summary Report / Loop Recorder 

## 2020-12-31 ENCOUNTER — Encounter: Payer: Self-pay | Admitting: Family Medicine

## 2020-12-31 ENCOUNTER — Encounter: Payer: Self-pay | Admitting: Ophthalmology

## 2020-12-31 ENCOUNTER — Ambulatory Visit (INDEPENDENT_AMBULATORY_CARE_PROVIDER_SITE_OTHER): Payer: Medicare PPO | Admitting: Family Medicine

## 2020-12-31 ENCOUNTER — Other Ambulatory Visit: Payer: Self-pay

## 2020-12-31 VITALS — BP 124/64 | HR 66 | Ht 68.0 in | Wt 244.0 lb

## 2020-12-31 DIAGNOSIS — Z9989 Dependence on other enabling machines and devices: Secondary | ICD-10-CM

## 2020-12-31 DIAGNOSIS — G4733 Obstructive sleep apnea (adult) (pediatric): Secondary | ICD-10-CM

## 2020-12-31 DIAGNOSIS — R7303 Prediabetes: Secondary | ICD-10-CM

## 2020-12-31 DIAGNOSIS — F5104 Psychophysiologic insomnia: Secondary | ICD-10-CM

## 2020-12-31 DIAGNOSIS — F3341 Major depressive disorder, recurrent, in partial remission: Secondary | ICD-10-CM | POA: Diagnosis not present

## 2020-12-31 MED ORDER — TRAZODONE HCL 50 MG PO TABS: 50.0000 mg | ORAL_TABLET | Freq: Every day | ORAL | 1 refills | Status: DC

## 2020-12-31 NOTE — Patient Instructions (Addendum)
Thank you for coming to the office today.  Go ahead and call Armida Sans, MD - for skin surface evaluation and abnormal skin lesion non healing on the nose.  [facility]   [address] [place], [place] [address] Hours: 8AM-5PM Phone: [phone]  ----------------------------------------------------  Start Trazodone 50mg  nightly for sleep regulation. This may take 2-4 weeks to kick in and have better effect.  Sleep Hygiene Recommendations to promote healthy sleep in all patients, especially if symptoms of insomnia are worsening. Due to the nature of sleep rhythms, if your body gets "out of rhythm", it may take some time before your sleep cycle can be "reset".  Please try to follow as many of the following tips as you can, usually there are only a few of these are the primary cause of the problem.  ?To reset your sleep rhythm, go to bed and get up at the same time every day ?Sleep only long enough to feel rested and then get out of bed ?Do not try to force yourself to sleep. If you can't sleep, get out of bed and try again later. ?Avoid naps during the day, unless excessively tired. The more sleeping during the day, then the less sleep your body needs at night.  ?Have coffee, tea, and other foods that have caffeine only in the morning ?Exercise several days a week, but not right before bed ?If you drink alcohol, prefer to have appropriate drink with one meal, but prefer to avoid alcohol in the evening, and bedtime ?If you smoke, avoid smoking, especially in the evening  ?Avoid watching TV or looking at phones, computers, or reading devices ("e-books") that give off light at least 30 minutes before bed. This artificial light sends "awake signals" to your brain and can make it harder to fall asleep. ?Make your bedroom a comfortable place where it is easy to fall asleep: Put up shades or special blackout curtains to block light from outside. Use a white noise machine to  block noise. Keep the temperature cool. ?Try your best to solve or at least address your problems before you go to bed ?Use relaxation techniques to manage stress. Ask your health care provider to suggest some techniques that may work well for you. These may include: Breathing exercises. Routines to release muscle tension. Visualizing peaceful scenes.   Please schedule a Follow-up Appointment to: Return in about 6 months (around 07/01/2021) for 6 month follow-up PreDM A1c.  If you have any other questions or concerns, please feel free to call the office or send a message through MyChart. You may also schedule an earlier appointment if necessary.  Additionally, you may be receiving a survey about your experience at our office within a few days to 1 week by e-mail or mail. We value your feedback.  07/03/2021, DO [facility], [facility]

## 2020-12-31 NOTE — Assessment & Plan Note (Signed)
Well controlled, chronic OSA on CPAP - Good adherence to CPAP nightly - Continue current CPAP therapy, patient seems to be benefiting from therapy  

## 2020-12-31 NOTE — Assessment & Plan Note (Signed)
Stable Continue SSRI Add Trazodone

## 2020-12-31 NOTE — Progress Notes (Signed)
Subjective:    Patient ID: Luke Mckee, male    DOB: 01-01-1941, 80 y.o.   MRN: [id number]  Luke Mckee is a 80 y.o. male presenting on 12/31/2020 for Hypertension   HPI  History of CVA with residual deficit R sided weakness / multiple L MCA / TIA history HTN CAD s/p CABG with stable angina on nitrate  Followed by Neuro/Cards, see prior notes for background information. S/p loop recorder. No identified AFib. He had issue with stroke on Eliquis. Now he is back on DAPT Plavix + ASA 25m and doing better - Low BP by report, still taking diuretics due to edema per Cardiology - lasix 80 BID He takes imdur 365mdaily and it controls CP. He has occasional dizziness mostly postural or movement related. Intermittent not consistent, not linked to low BP Has missed dose Lasix occasionally Upcoming visit with Cards prior to cataract surgery Denies any chest pain, new focal weakness, dyspnea, worsening edema, near syncope or syncope   Elevated A1c / PreDM Morbid Obesity BMI >37 Last result A1c 6.2 12/2020, stable range 6.1 to 6.3   Hypothyroidism Chronic problem Last lab now 12/2020 showed normal Thyroid panel and T4 He is taking Levothyroxine 12560mdaily, currently doing well - with some improvement after med adjustment of changing dosing so does not take other meds with levothyroxine.   Anxiety / Depression recurrent in partial remission - Last visit with me 04/2020, for same problem anxiety, treated with dose increase Sertraline from 150 to 200m64mee prior notes for background information. - Interval update with improvement. - Today patient reports now taking Sertraline 100mg51m = 200mg 39mdoing better, also has Lorazepam 0.5mg as4mPRN medication   Psychological Insomnia Previous history on Ambien unsure if helpful. Interested in other med for sleep. Goes to bed around 11pm or later, will end up waking up early for thyroid pill sleeping late.  Rarely bothering him. Has occasional  mood symptoms. Doing well on Sertraline. Also for anxiety, takes Lorazepam rarely, has some left  Skin Surface Evaluation request - followed by Dr KowalskNehemiah Massedlogy in 2021, he has an abnormal non healing lesion on nose.    OSA on CPAP Followed by Pulmonology, no repeat sleep study - Today reports that sleep apnea is well controlled. He uses the CPAP machine every night. Tolerates the machine well, and thinks that sleeps better with it and feels good. No new concerns or symptoms.   Health Maintenance:  PSA 1.13 (12/2020) negative.   Depression screen PHQ 2/[facility]/06/2020 07/29/2020 07/28/2020  Decreased Interest 0 0 0  Down, Depressed, Hopeless 0 0 0  PHQ - 2 Score 0 0 0  Altered sleeping 0 - -  Tired, decreased energy 0 - -  Change in appetite 0 - -  Feeling bad or failure about yourself  0 - -  Trouble concentrating 0 - -  Moving slowly or fidgety/restless 0 - -  Suicidal thoughts 0 - -  PHQ-9 Score 0 - -  Difficult doing work/chores Not difficult at all - -  Some recent data might be hidden    Social History   Tobacco Use   Smoking status: Never   Smokeless tobacco: Never  Vaping Use   Vaping Use: Never used  Substance Use Topics   Alcohol use: Not Currently    Comment: past   Drug use: Never    Review of Systems Per HPI unless specifically indicated above     Objective:  BP 124/64    Pulse 66    Ht _0  (1.727 m)    Wt 244 lb (110.7 kg)    SpO2 93%    BMI 37.10 kg/m   Wt Readings from Last 3 Encounters:  12/31/20 244 lb (110.7 kg)  10/16/20 246 lb 12.8 oz (111.9 kg)  10/08/20 238 lb 1.6 oz (108 kg)    Physical Exam Vitals and nursing note reviewed.  Constitutional:      General: He is not in acute distress.    Appearance: Normal appearance. He is well-developed. He is not diaphoretic.     Comments: Well-appearing, comfortable, cooperative  HENT:     Head: Normocephalic and atraumatic.  Eyes:     General:        Right eye: No discharge.         Left eye: No discharge.     Conjunctiva/sclera: Conjunctivae normal.  Cardiovascular:     Rate and Rhythm: Normal rate.  Pulmonary:     Effort: Pulmonary effort is normal.  Skin:    General: Skin is warm and dry.     Findings: No erythema or rash.  Neurological:     Mental Status: He is alert and oriented to person, place, and time.  Psychiatric:        Mood and Affect: Mood normal.        Behavior: Behavior normal.        Thought Content: Thought content normal.     Comments: Well groomed, good eye contact, normal speech and thoughts   Results for orders placed or performed in visit on 12/23/20  COMPLETE METABOLIC PANEL WITH GFR  Result Value Ref Range   Glucose, Bld 101 (H) 65 - 99 mg/dL   BUN 18 7 - 25 mg/dL   Creat 0.93 0.70 - 1.28 mg/dL   eGFR 84 > OR = 60 mL/min/1.46m   BUN/Creatinine Ratio NOT APPLICABLE 6 - 22 (calc)   Sodium 141 135 - 146 mmol/L   Potassium 4.1 3.5 - 5.3 mmol/L   Chloride 102 98 - 110 mmol/L   CO2 30 20 - 32 mmol/L   Calcium 9.3 8.6 - 10.3 mg/dL   Total Protein 7.4 6.1 - 8.1 g/dL   Albumin 4.2 3.6 - 5.1 g/dL   Globulin 3.2 1.9 - 3.7 g/dL (calc)   AG Ratio 1.3 1.0 - 2.5 (calc)   Total Bilirubin 0.6 0.2 - 1.2 mg/dL   Alkaline phosphatase (APISO) 57 35 - 144 U/L   AST 18 10 - 35 U/L   ALT 18 9 - 46 U/L  Lipid panel  Result Value Ref Range   Cholesterol 130 <200 mg/dL   HDL 36 (L) > OR = 40 mg/dL   Triglycerides 152 (H) <150 mg/dL   LDL Cholesterol (Calc) 71 mg/dL (calc)   Total CHOL/HDL Ratio 3.6 <5.0 (calc)   Non-HDL Cholesterol (Calc) 94 <130 mg/dL (calc)  CBC with Differential/Platelet  Result Value Ref Range   WBC 7.8 3.8 - 10.8 Thousand/uL   RBC 4.43 4.20 - 5.80 Million/uL   Hemoglobin 14.0 13.2 - 17.1 g/dL   HCT 41.8 38.5 - 50.0 %   MCV 94.4 80.0 - 100.0 fL   MCH 31.6 27.0 - 33.0 pg   MCHC 33.5 32.0 - 36.0 g/dL   RDW 13.3 11.0 - 15.0 %   Platelets 231 140 - 400 Thousand/uL   MPV 9.5 7.5 - 12.5 fL   Neutro Abs 4,664 1,500 - 7,800  cells/uL  Lymphs Abs 2,090 850 - 3,900 cells/uL   Absolute Monocytes 663 200 - 950 cells/uL   Eosinophils Absolute 312 15 - 500 cells/uL   Basophils Absolute 70 0 - 200 cells/uL   Neutrophils Relative % 59.8 %   Total Lymphocyte 26.8 %   Monocytes Relative 8.5 %   Eosinophils Relative 4.0 %   Basophils Relative 0.9 %  Hemoglobin A1c  Result Value Ref Range   Hgb A1c MFr Bld 6.2 (H) <5.7 % of total Hgb   Mean Plasma Glucose 131 mg/dL   eAG (mmol/L) 7.3 mmol/L  PSA  Result Value Ref Range   PSA 1.13 < OR = 4.00 ng/mL  TSH  Result Value Ref Range   TSH 1.81 0.40 - 4.50 mIU/L  T4, free  Result Value Ref Range   Free T4 1.5 0.8 - 1.8 ng/dL      Assessment & Plan:   Problem List Items Addressed This Visit     Recurrent major depressive disorder, in partial remission (HCC)    Stable Continue SSRI Add Trazodone      Relevant Medications   traZODone (DESYREL) 50 MG tablet   Pre-diabetes    Improved Controlled A1c 6.1      OSA on CPAP    Well controlled, chronic OSA on CPAP - Good adherence to CPAP nightly - Continue current CPAP therapy, patient seems to be benefiting from therapy      Other Visit Diagnoses     Psychophysiologic insomnia    -  Primary   Relevant Medications   traZODone (DESYREL) 50 MG tablet       Call [facility] to follow-up with Dermatology for skin surveillance.    Meds ordered this encounter  Medications   traZODone (DESYREL) 50 MG tablet    Sig: Take 1 tablet (50 mg total) by mouth at bedtime.    Dispense:  90 tablet    Refill:  1      Follow up plan: Return in about 6 months (around 07/01/2021) for 6 month follow-up PreDM A1c.  Nobie Putnam, [facility] 12/31/2020, 3:04 PM

## 2020-12-31 NOTE — Assessment & Plan Note (Signed)
Improved Controlled A1c 6.1

## 2021-01-08 ENCOUNTER — Ambulatory Visit (INDEPENDENT_AMBULATORY_CARE_PROVIDER_SITE_OTHER): Payer: Medicare PPO

## 2021-01-08 DIAGNOSIS — E782 Mixed hyperlipidemia: Secondary | ICD-10-CM

## 2021-01-08 DIAGNOSIS — I10 Essential (primary) hypertension: Secondary | ICD-10-CM

## 2021-01-08 DIAGNOSIS — R296 Repeated falls: Secondary | ICD-10-CM

## 2021-01-08 DIAGNOSIS — I5022 Chronic systolic (congestive) heart failure: Secondary | ICD-10-CM

## 2021-01-08 DIAGNOSIS — F3341 Major depressive disorder, recurrent, in partial remission: Secondary | ICD-10-CM

## 2021-01-08 NOTE — Patient Instructions (Signed)
Visit Information  Thank you for taking time to visit with me today. Please don't hesitate to contact me if I can be of assistance to you before our next scheduled telephone appointment.  Following are the goals we discussed today:  RNCM Clinical Goal(s):  Patient will verbalize understanding of plan for management of CHF, HTN, HLD, Depression, and falls and safety as evidenced by following the plan of care, compliance with medications, compliance with dietary restrictions  take all medications exactly as prescribed and will call provider for medication related questions as evidenced by compliance with medications and calling for needed refills before running out    attend all scheduled medical appointments: 07-03-2021 at 200 pm as evidenced by keeping appointments and calling if appointment needs to be rescheduled        demonstrate improved and ongoing adherence to prescribed treatment plan for CHF, HTN, HLD, Depression, and Falls and safety preventions as evidenced by working with the CCM team and pcp to effectively manage chronic conditions  demonstrate a decrease in CHF, HTN, HLD, Depression, and falls and safety exacerbations  as evidenced by stable conditions, calling for changes, and working with the CCM team to optimize health and well being demonstrate ongoing self health care management ability effective management of chronic conditions as evidenced by  working with the CCM team through collaboration with Consulting civil engineer, provider, and care team.    Interventions: 1:1 collaboration with primary care provider regarding development and update of comprehensive plan of care as evidenced by provider attestation and co-signature Inter-disciplinary care team collaboration (see longitudinal plan of care) Evaluation of current treatment plan related to  self management and patient's adherence to plan as established by provider     SDOH Barriers (Status: Goal on Track (progressing): YES.) Long  Term Goal  Patient interviewed and SDOH assessment performed        Patient interviewed and appropriate assessments performed Provided patient with information about resources available and the care guides being available to assist with changes or new needs Discussed plans with patient for ongoing care management follow up and provided patient with direct contact information for care management team Advised patient to call for changes in SDOH, questions and concerns       Heart Failure Interventions:  (Status: Goal on Track (progressing): YES.)  Long Term Goal  Basic overview and discussion of pathophysiology of Heart Failure reviewed Provided education on low sodium diet. 01-08-2021: Is compliant with a heart healthy diet  Reviewed Heart Failure Action Plan in depth and provided written copy Assessed need for readable accurate scales in home Provided education about placing scale on hard, flat surface Advised patient to weigh each morning after emptying bladder Discussed importance of daily weight and advised patient to weigh and record daily Reviewed role of diuretics in prevention of fluid overload and management of heart failure Discussed the importance of keeping all appointments with provider Provided patient with education about the role of exercise in the management of heart failure Advised patient to discuss new changes, edema more than usual with provider   Depression   (Status: Goal on Track (progressing): YES.) Long Term Goal  Evaluation of current treatment plan related to Depression, Mental Health Concerns  self-management and patient's adherence to plan as established by provider. 01-08-2021: The patient states he feels "Out of it today". The patients wife states he seemed to be uptight earlier and not feeling well so she gave him some anxiety medications and that has helped him  settle down. She states they were busy and over at their daughters a lot during Christmas. The  patient denies any acute distress. Empathetic listening and support given.  Discussed plans with patient for ongoing care management follow up and provided patient with direct contact information for care management team Advised patient to call for changes in mood, anxiety, depression; Provided education to patient re: staying positive, being around positive people, and reporting new changes in depression; Reviewed medications with patient and discussed compliance; Reviewed scheduled/upcoming provider appointments including 07-03-2021 at 2 pm; Discussed plans with patient for ongoing care management follow up and provided patient with direct contact information for care management team; Advised patient to discuss changes in depression with provider; Screening for signs and symptoms of depression related to chronic disease state;  Assessed social determinant of health barriers;  11-27-2020: Call made back to the patient and spoke to the patients wife Arbie Cookey, the patient has a place on his nose that comes up and goes back down. The patient and wife were asking about referral to dermatologist. Collaboration with the pcp and the patient saw Dr. Nehemiah Massed in the summer of 2021 and had places removed. The patient should not need another referral. Information provided to the patient and the patients wife. The number to call and make an appointment is [phone]. Reviewed with the patient and the patients wife and understanding was verbalized.    Hyperlipidemia:  (Status: Goal on Track (progressing): YES.) Long Term Goal       Lab Results  Component Value Date    CHOL 130 12/24/2020    HDL 36 (L) 12/24/2020    LDLCALC 71 12/24/2020    TRIG 152 (H) 12/24/2020    CHOLHDL 3.6 12/24/2020      Medication review performed; medication list updated in electronic medical record.  Provider established cholesterol goals reviewed. 01-08-2021: Review of goals of cholesterol levels Counseled on importance of  regular laboratory monitoring as prescribed. 01-08-2021: Review of regular labwork Provided HLD educational materials; Reviewed role and benefits of statin for ASCVD risk reduction; Discussed strategies to manage statin-induced myalgias; Reviewed importance of limiting foods high in cholesterol; Reviewed exercise goals and target of 150 minutes per week; Screening for signs and symptoms of depression related to chronic disease state;  Assessed social determinant of health barriers;  Review of pharm D notes and outreach on 11-26-2020 for medication questions the patients wife had with interactions and making sure what the patient was taking was safe. 01-08-2021: The patient and patients wife work with the pharm D on a consistent basis.    Hypertension: (Status: Goal on Track (progressing): YES.) Last practice recorded BP readings:     BP Readings from Last 3 Encounters:  12/31/20 124/64  10/16/20 129/62  10/08/20 108/65  Most recent eGFR/CrCl: No results found for: EGFR  No components found for: CRCL   Evaluation of current treatment plan related to hypertension self management and patient's adherence to plan as established by provider. 01-08-2021: The patient has good blood pressures. Today during the call his blood pressure was 123/63 with a pulse of 70 and 128/76 with pulse of 75;  He states today was not the best of days but he is feeling better now.  Provided education to patient re: stroke prevention, s/s of heart attack and stroke; Reviewed prescribed diet heart healthy Reviewed medications with patient and discussed importance of compliance;  Discussed plans with patient for ongoing care management follow up and provided patient with direct contact information  for care management team; Advised patient, providing education and rationale, to monitor blood pressure daily and record, calling PCP for findings outside established parameters;  Advised patient to discuss changes in blood  pressure trends with provider; Provided education on prescribed diet heart healthy;  Discussed complications of poorly controlled blood pressure such as heart disease, stroke, circulatory complications, vision complications, kidney impairment, sexual dysfunction;      Falls:  (Status: Goal on Track (progressing): YES.) Long Term Goal  Provided written and verbal education re: potential causes of falls and Fall prevention strategies Reviewed medications and discussed potential side effects of medications such as dizziness and frequent urination Advised patient of importance of notifying provider of falls Assessed for signs and symptoms of orthostatic hypotension Assessed for falls since last encounter. 11-24-2020: last reported fall at 10-07-2020. 01-08-2021: Denies any new falls. Is being safe. Will continue to monitor.  Assessed patients knowledge of fall risk prevention secondary to previously provided education Provided patient information for fall alert systems Assessed working status of life alert bracelet and patient adherence Advised patient to discuss new falls with provider    Patient Goals/Self-Care Activities: Patient will self administer medications as prescribed as evidenced by self report/primary caregiver report  Patient will attend all scheduled provider appointments as evidenced by clinician review of documented attendance to scheduled appointments and patient/caregiver report Patient will call pharmacy for medication refills as evidenced by patient report and review of pharmacy fill history as appropriate Patient will attend church or other social activities as evidenced by patient report Patient will continue to perform ADL's independently as evidenced by patient/caregiver report Patient will continue to perform IADL's independently as evidenced by patient/caregiver report Patient will call provider office for new concerns or questions as evidenced by review of documented  incoming telephone call notes and patient report Patient will work with BSW to address care coordination needs and will continue to work with the clinical team to address health care and disease management related needs as evidenced by documented adherence to scheduled care management/care coordination appointments call office if I gain more than 2 pounds in one day or 5 pounds in one week keep legs up while sitting track weight in diary use salt in moderation watch for swelling in feet, ankles and legs every day weigh myself daily develop a rescue plan follow rescue plan if symptoms flare-up eat more whole grains, fruits and vegetables, lean meats and healthy fats know when to call the doctorfor changes in sx and sx of HF exacerbation  track symptoms and what helps feel better or worse dress right for the weather, hot or cold - check blood pressure weekly - choose a place to take my blood pressure (home, clinic or office, retail store) - write blood pressure results in a log or diary - learn about high blood pressure - keep a blood pressure log - take blood pressure log to all doctor appointments - call doctor for signs and symptoms of high blood pressure - develop an action plan for high blood pressure - keep all doctor appointments - take medications for blood pressure exactly as prescribed - report new symptoms to your doctor - eat more whole grains, fruits and vegetables, lean meats and healthy fats - call for medicine refill 2 or 3 days before it runs out - take all medications exactly as prescribed - call doctor with any symptoms you believe are related to your medicine - call doctor when you experience any new symptoms - go to  all doctor appointments as scheduled - adhere to prescribed diet: heart healthy diet       Our next appointment is by telephone on 01-26-2021 at 230 pm  Please call the care guide team at [phone] if you need to cancel or reschedule your  appointment.   If you are experiencing a Mental Health or [place] or need someone to talk to, please call the Suicide and Crisis Lifeline: 988 call the [place] National Suicide Prevention Lifeline: 908-198-6857 or TTY: 773 841 9937 TTY 2241295642) to talk to a trained counselor call 1-800-273-TALK (toll free, 24 hour hotline)   Patient verbalizes understanding of instructions provided today and agrees to view in [place].   Noreene Larsson RN, MSN, [place] Mobile: [phone]

## 2021-01-08 NOTE — Chronic Care Management (AMB) (Signed)
Chronic Care Management   CCM RN Visit Note  01/08/2021 Name: Luke Mckee MRN: [id number] DOB: November 24, 1940  Subjective: Luke Mckee is a 80 y.o. year old male who is a primary care patient of Olin Hauser, DO. The care management team was consulted for assistance with disease management and care coordination needs.    Engaged with patient by telephone for follow up visit in response to provider referral for case management and/or care coordination services.   Consent to Services:  The patient was given information about Chronic Care Management services, agreed to services, and gave verbal consent prior to initiation of services.  Please see initial visit note for detailed documentation.   Patient agreed to services and verbal consent obtained.   Assessment: Review of patient past medical history, allergies, medications, health status, including review of consultants reports, laboratory and other test data, was performed as part of comprehensive evaluation and provision of chronic care management services.   SDOH (Social Determinants of Health) assessments and interventions performed:    CCM Care Plan  Allergies  Allergen Reactions   Ace Inhibitors    Cephalexin     Vomiting and diarrhea   Crestor [Rosuvastatin Calcium] Other (See Comments)    myalgia   Tape Rash    blistering    Outpatient Encounter Medications as of 01/08/2021  Medication Sig   acetaminophen (TYLENOL) 650 MG CR tablet Take 1,300 mg by mouth at bedtime as needed.   albuterol (VENTOLIN HFA) 108 (90 Base) MCG/ACT inhaler Inhale 1-2 puffs into the lungs every 4 (four) hours as needed for wheezing or shortness of breath (cough).   aspirin 81 MG chewable tablet Chew 81 mg by mouth daily.   atenolol (TENORMIN) 50 MG tablet Take 1 tablet (50 mg total) by mouth at bedtime.   atorvastatin (LIPITOR) 80 MG tablet Take 1 tablet (80 mg total) by mouth daily.   Cholecalciferol (VITAMIN D) 50 MCG (2000 UT)  tablet Take 4,000 Units by mouth daily.   clopidogrel (PLAVIX) 75 MG tablet TAKE 1 TABLET(75 MG) BY MOUTH DAILY   clotrimazole-betamethasone (LOTRISONE) cream Apply twice a day for 1-2 weeks, may repeat if need in future   co-enzyme Q-10 30 MG capsule Take 100 mg by mouth daily.   diclofenac sodium (VOLTAREN) 1 % GEL Apply 2 g topically 4 (four) times daily as needed for pain. Foot pain   docusate sodium (COLACE) 100 MG capsule Take 200 mg by mouth daily.   furosemide (LASIX) 80 MG tablet Take 1 tablet (80 mg total) by mouth 2 (two) times daily.   isosorbide mononitrate (IMDUR) 30 MG 24 hr tablet Take 1 tablet (30 mg total) by mouth daily.   levothyroxine (SYNTHROID) 125 MCG tablet Take 1 tablet (125 mcg total) by mouth daily before breakfast.   loratadine (CLARITIN) 10 MG tablet Take 10 mg by mouth daily.   LORazepam (ATIVAN) 0.5 MG tablet Take 1 tablet (0.5 mg total) by mouth daily as needed for anxiety or sleep.   montelukast (SINGULAIR) 10 MG tablet Take 1 tablet (10 mg total) by mouth at bedtime.   Multiple Vitamin (MULTIVITAMIN) tablet Take 1 tablet by mouth daily.   nitroGLYCERIN (NITROSTAT) 0.4 MG SL tablet Place 1 tablet (0.4 mg total) under the tongue every 5 (five) minutes as needed for chest pain.   PATADAY 0.1 % ophthalmic solution Place into both eyes as needed.    senna (SENOKOT) 8.6 MG tablet Take 1 tablet by mouth daily.   sertraline (ZOLOFT) 100  MG tablet Take 2 tablets (200 mg total) by mouth daily.   spironolactone (ALDACTONE) 25 MG tablet Take 0.5 tablets (12.5 mg total) by mouth daily.   traZODone (DESYREL) 50 MG tablet Take 1 tablet (50 mg total) by mouth at bedtime.   No facility-administered encounter medications on file as of 01/08/2021.    Patient Active Problem List   Diagnosis Date Noted   Hallux limitus, acquired, right 11/12/2019   Pre-diabetes 10/17/2019   Recurrent major depressive disorder, in partial remission ([place]) 04/16/2019   Morbid obesity ([place])  04/16/2019   Vasomotor rhinitis 10/25/2018   Hearing loss 08/28/2018   Osteoarthritis 08/28/2018   Tinnitus 08/28/2018   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 ([place]) 07/31/2018   TIA (transient ischemic attack) 07/23/2018   Slurred speech 07/23/2018   Right arm weakness 07/23/2018   Hypokalemia 07/23/2018   Anxiety 07/20/2018   Atherosclerosis of native coronary artery of native heart with stable angina pectoris ([place]) 06/16/2018   Hx of CABG 06/16/2018   Mixed hyperlipidemia 06/16/2018   Benign essential HTN 06/16/2018   History of cerebrovascular accident (CVA) with residual deficit 75/17/0017   Systolic CHF ([place]) 49/44/9675   Hypertension 02/15/2018   S/P total knee arthroplasty, right 02/15/2018   Primary osteoarthritis of both knees 11/25/2017   Abnormal nuclear stress test 06/30/2016   Hallux limitus of right foot 10/22/2015   Anesthesia complication 91/63/8466   GERD (gastroesophageal reflux disease) 10/10/2015   Hypothyroidism 10/10/2015   OSA on CPAP 01/10/2007   Diagnosis unknown 01/12/2003    Conditions to be addressed/monitored:CHF, HTN, HLD, Depression, and falls  Care Plan : RNCM: General Plan of Care (Adult) for Chronic Disease Management and Care Coordination Needs  Updates made by Vanita Ingles, RN since 01/08/2021 12:00 AM     Problem: RNCM: Development of Plan of Care For Chronic Disease Management (HF, HTN, HLD, Depression, Falls)   Priority: High     Long-Range Goal: RNCM: Effective Management of Plan of Care For Chronic Disease Management (HF, HTN, HLD, Depression, Falls)   Start Date: 11/24/2020  Expected End Date: 11/24/2021  Priority: High  Note:   Current Barriers:  Knowledge Deficits related to plan of care for management of CHF, Depression: depressed mood, and falls prevention and safety   Chronic Disease Management support and education needs related to CHF, HTN, HLD, Depression: depressed mood, and falls and  safety  RNCM Clinical Goal(s):  Patient will verbalize understanding of plan for management of CHF, HTN, HLD, Depression, and falls and safety as evidenced by following the plan of care, compliance with medications, compliance with dietary restrictions  take all medications exactly as prescribed and will call provider for medication related questions as evidenced by compliance with medications and calling for needed refills before running out    attend all scheduled medical appointments: 07-03-2021 at 200 pm as evidenced by keeping appointments and calling if appointment needs to be rescheduled        demonstrate improved and ongoing adherence to prescribed treatment plan for CHF, HTN, HLD, Depression, and Falls and safety preventions as evidenced by working with the CCM team and pcp to effectively manage chronic conditions  demonstrate a decrease in CHF, HTN, HLD, Depression, and falls and safety exacerbations  as evidenced by stable conditions, calling for changes, and working with the CCM team to optimize health and well being demonstrate ongoing self health care management ability effective management of chronic conditions as evidenced by  working with the  CCM team through collaboration with RN Care manager, provider, and care team.   Interventions: 1:1 collaboration with primary care provider regarding development and update of comprehensive plan of care as evidenced by provider attestation and co-signature Inter-disciplinary care team collaboration (see longitudinal plan of care) Evaluation of current treatment plan related to  self management and patient's adherence to plan as established by provider   SDOH Barriers (Status: Goal on Track (progressing): YES.) Long Term Goal  Patient interviewed and SDOH assessment performed        Patient interviewed and appropriate assessments performed Provided patient with information about resources available and the care guides being available to  assist with changes or new needs Discussed plans with patient for ongoing care management follow up and provided patient with direct contact information for care management team Advised patient to call for changes in SDOH, questions and concerns    Heart Failure Interventions:  (Status: Goal on Track (progressing): YES.)  Long Term Goal  Basic overview and discussion of pathophysiology of Heart Failure reviewed Provided education on low sodium diet. 01-08-2021: Is compliant with a heart healthy diet  Reviewed Heart Failure Action Plan in depth and provided written copy Assessed need for readable accurate scales in home Provided education about placing scale on hard, flat surface Advised patient to weigh each morning after emptying bladder Discussed importance of daily weight and advised patient to weigh and record daily Reviewed role of diuretics in prevention of fluid overload and management of heart failure Discussed the importance of keeping all appointments with provider Provided patient with education about the role of exercise in the management of heart failure Advised patient to discuss new changes, edema more than usual with provider  Depression   (Status: Goal on Track (progressing): YES.) Long Term Goal  Evaluation of current treatment plan related to Depression, Mental Health Concerns  self-management and patient's adherence to plan as established by provider. 01-08-2021: The patient states he feels "Out of it today". The patients wife states he seemed to be uptight earlier and not feeling well so she gave him some anxiety medications and that has helped him settle down. She states they were busy and over at their daughters a lot during Christmas. The patient denies any acute distress. Empathetic listening and support given.  Discussed plans with patient for ongoing care management follow up and provided patient with direct contact information for care management team Advised patient  to call for changes in mood, anxiety, depression; Provided education to patient re: staying positive, being around positive people, and reporting new changes in depression; Reviewed medications with patient and discussed compliance; Reviewed scheduled/upcoming provider appointments including 07-03-2021 at 2 pm; Discussed plans with patient for ongoing care management follow up and provided patient with direct contact information for care management team; Advised patient to discuss changes in depression with provider; Screening for signs and symptoms of depression related to chronic disease state;  Assessed social determinant of health barriers;  11-27-2020: Call made back to the patient and spoke to the patients wife Arbie Cookey, the patient has a place on his nose that comes up and goes back down. The patient and wife were asking about referral to dermatologist. Collaboration with the pcp and the patient saw Dr. Nehemiah Massed in the summer of 2021 and had places removed. The patient should not need another referral. Information provided to the patient and the patients wife. The number to call and make an appointment is [phone]. Reviewed with the patient and the patients  wife and understanding was verbalized.   Hyperlipidemia:  (Status: Goal on Track (progressing): YES.) Long Term Goal  Lab Results  Component Value Date   CHOL 130 12/24/2020   HDL 36 (L) 12/24/2020   LDLCALC 71 12/24/2020   TRIG 152 (H) 12/24/2020   CHOLHDL 3.6 12/24/2020     Medication review performed; medication list updated in electronic medical record.  Provider established cholesterol goals reviewed. 01-08-2021: Review of goals of cholesterol levels Counseled on importance of regular laboratory monitoring as prescribed. 01-08-2021: Review of regular labwork Provided HLD educational materials; Reviewed role and benefits of statin for ASCVD risk reduction; Discussed strategies to manage statin-induced myalgias; Reviewed  importance of limiting foods high in cholesterol; Reviewed exercise goals and target of 150 minutes per week; Screening for signs and symptoms of depression related to chronic disease state;  Assessed social determinant of health barriers;  Review of pharm D notes and outreach on 11-26-2020 for medication questions the patients wife had with interactions and making sure what the patient was taking was safe. 01-08-2021: The patient and patients wife work with the pharm D on a consistent basis.   Hypertension: (Status: Goal on Track (progressing): YES.) Last practice recorded BP readings:  BP Readings from Last 3 Encounters:  12/31/20 124/64  10/16/20 129/62  10/08/20 108/65  Most recent eGFR/CrCl: No results found for: EGFR  No components found for: CRCL  Evaluation of current treatment plan related to hypertension self management and patient's adherence to plan as established by provider. 01-08-2021: The patient has good blood pressures. Today during the call his blood pressure was 123/63 with a pulse of 70 and 128/76 with pulse of 75;  He states today was not the best of days but he is feeling better now.  Provided education to patient re: stroke prevention, s/s of heart attack and stroke; Reviewed prescribed diet heart healthy Reviewed medications with patient and discussed importance of compliance;  Discussed plans with patient for ongoing care management follow up and provided patient with direct contact information for care management team; Advised patient, providing education and rationale, to monitor blood pressure daily and record, calling PCP for findings outside established parameters;  Advised patient to discuss changes in blood pressure trends with provider; Provided education on prescribed diet heart healthy;  Discussed complications of poorly controlled blood pressure such as heart disease, stroke, circulatory complications, vision complications, kidney impairment, sexual  dysfunction;    Falls:  (Status: Goal on Track (progressing): YES.) Long Term Goal  Provided written and verbal education re: potential causes of falls and Fall prevention strategies Reviewed medications and discussed potential side effects of medications such as dizziness and frequent urination Advised patient of importance of notifying provider of falls Assessed for signs and symptoms of orthostatic hypotension Assessed for falls since last encounter. 11-24-2020: last reported fall at 10-07-2020. 01-08-2021: Denies any new falls. Is being safe. Will continue to monitor.  Assessed patients knowledge of fall risk prevention secondary to previously provided education Provided patient information for fall alert systems Assessed working status of life alert bracelet and patient adherence Advised patient to discuss new falls with provider   Patient Goals/Self-Care Activities: Patient will self administer medications as prescribed as evidenced by self report/primary caregiver report  Patient will attend all scheduled provider appointments as evidenced by clinician review of documented attendance to scheduled appointments and patient/caregiver report Patient will call pharmacy for medication refills as evidenced by patient report and review of pharmacy fill history as appropriate Patient will  attend church or other social activities as evidenced by patient report Patient will continue to perform ADL's independently as evidenced by patient/caregiver report Patient will continue to perform IADL's independently as evidenced by patient/caregiver report Patient will call provider office for new concerns or questions as evidenced by review of documented incoming telephone call notes and patient report Patient will work with BSW to address care coordination needs and will continue to work with the clinical team to address health care and disease management related needs as evidenced by documented adherence  to scheduled care management/care coordination appointments call office if I gain more than 2 pounds in one day or 5 pounds in one week keep legs up while sitting track weight in diary use salt in moderation watch for swelling in feet, ankles and legs every day weigh myself daily develop a rescue plan follow rescue plan if symptoms flare-up eat more whole grains, fruits and vegetables, lean meats and healthy fats know when to call the doctorfor changes in sx and sx of HF exacerbation  track symptoms and what helps feel better or worse dress right for the weather, hot or cold - check blood pressure weekly - choose a place to take my blood pressure (home, clinic or office, retail store) - write blood pressure results in a log or diary - learn about high blood pressure - keep a blood pressure log - take blood pressure log to all doctor appointments - call doctor for signs and symptoms of high blood pressure - develop an action plan for high blood pressure - keep all doctor appointments - take medications for blood pressure exactly as prescribed - report new symptoms to your doctor - eat more whole grains, fruits and vegetables, lean meats and healthy fats - call for medicine refill 2 or 3 days before it runs out - take all medications exactly as prescribed - call doctor with any symptoms you believe are related to your medicine - call doctor when you experience any new symptoms - go to all doctor appointments as scheduled - adhere to prescribed diet: heart healthy diet        Plan:Telephone follow up appointment with care management team member scheduled for:  01-26-2021 at 230 pm  [place], MSN, [place] Mobile: [phone]

## 2021-01-10 DIAGNOSIS — I10 Essential (primary) hypertension: Secondary | ICD-10-CM | POA: Diagnosis not present

## 2021-01-10 DIAGNOSIS — F3341 Major depressive disorder, recurrent, in partial remission: Secondary | ICD-10-CM | POA: Diagnosis not present

## 2021-01-10 DIAGNOSIS — I5022 Chronic systolic (congestive) heart failure: Secondary | ICD-10-CM | POA: Diagnosis not present

## 2021-01-10 DIAGNOSIS — E782 Mixed hyperlipidemia: Secondary | ICD-10-CM | POA: Diagnosis not present

## 2021-01-14 NOTE — Discharge Instructions (Signed)

## 2021-01-19 ENCOUNTER — Ambulatory Visit: Payer: Medicare PPO | Admitting: Anesthesiology

## 2021-01-19 ENCOUNTER — Ambulatory Visit
Admission: RE | Admit: 2021-01-19 | Discharge: 2021-01-19 | Disposition: A | Discharge status: Home / Self Care | Payer: Medicare PPO | Source: Ambulatory Visit | Attending: Ophthalmology | Admitting: Ophthalmology

## 2021-01-19 ENCOUNTER — Other Ambulatory Visit: Payer: Self-pay

## 2021-01-19 ENCOUNTER — Encounter
Admission: RE | Disposition: A | Discharge status: Home / Self Care | Payer: Self-pay | Source: Ambulatory Visit | Attending: Ophthalmology

## 2021-01-19 ENCOUNTER — Encounter: Payer: Self-pay | Admitting: Ophthalmology

## 2021-01-19 DIAGNOSIS — H2512 Age-related nuclear cataract, left eye: Secondary | ICD-10-CM | POA: Insufficient documentation

## 2021-01-19 DIAGNOSIS — Z955 Presence of coronary angioplasty implant and graft: Secondary | ICD-10-CM | POA: Insufficient documentation

## 2021-01-19 DIAGNOSIS — I4891 Unspecified atrial fibrillation: Secondary | ICD-10-CM | POA: Diagnosis not present

## 2021-01-19 DIAGNOSIS — I130 Hypertensive heart and chronic kidney disease with heart failure and stage 1 through stage 4 chronic kidney disease, or unspecified chronic kidney disease: Secondary | ICD-10-CM | POA: Diagnosis not present

## 2021-01-19 DIAGNOSIS — Z951 Presence of aortocoronary bypass graft: Secondary | ICD-10-CM | POA: Diagnosis not present

## 2021-01-19 DIAGNOSIS — N189 Chronic kidney disease, unspecified: Secondary | ICD-10-CM | POA: Insufficient documentation

## 2021-01-19 DIAGNOSIS — G4730 Sleep apnea, unspecified: Secondary | ICD-10-CM | POA: Diagnosis not present

## 2021-01-19 DIAGNOSIS — J449 Chronic obstructive pulmonary disease, unspecified: Secondary | ICD-10-CM | POA: Insufficient documentation

## 2021-01-19 DIAGNOSIS — M1990 Unspecified osteoarthritis, unspecified site: Secondary | ICD-10-CM | POA: Insufficient documentation

## 2021-01-19 DIAGNOSIS — I2510 Atherosclerotic heart disease of native coronary artery without angina pectoris: Secondary | ICD-10-CM | POA: Diagnosis not present

## 2021-01-19 DIAGNOSIS — E039 Hypothyroidism, unspecified: Secondary | ICD-10-CM | POA: Insufficient documentation

## 2021-01-19 DIAGNOSIS — I509 Heart failure, unspecified: Secondary | ICD-10-CM | POA: Insufficient documentation

## 2021-01-19 DIAGNOSIS — H25812 Combined forms of age-related cataract, left eye: Secondary | ICD-10-CM | POA: Diagnosis not present

## 2021-01-19 HISTORY — PX: CATARACT EXTRACTION W/PHACO: SHX586

## 2021-01-19 SURGERY — PHACOEMULSIFICATION, CATARACT, WITH IOL INSERTION
Anesthesia: Monitor Anesthesia Care | Site: Eye | Laterality: Left

## 2021-01-19 MED ORDER — SIGHTPATH DOSE#1 BSS IO SOLN
INTRAOCULAR | Status: DC | PRN
  Administered 2021-01-19: 90 mL via OPHTHALMIC

## 2021-01-19 MED ORDER — ACETAMINOPHEN 160 MG/5ML PO SOLN: 975.0000 mg | Freq: Once | ORAL | Status: DC | PRN

## 2021-01-19 MED ORDER — MOXIFLOXACIN HCL 0.5 % OP SOLN
OPHTHALMIC | Status: DC | PRN
  Administered 2021-01-19: 0.2 mL via OPHTHALMIC

## 2021-01-19 MED ORDER — LIDOCAINE HCL (PF) 2 % IJ SOLN
INTRAOCULAR | Status: DC | PRN
  Administered 2021-01-19: 1 mL via INTRAOCULAR

## 2021-01-19 MED ORDER — ARMC OPHTHALMIC DILATING DROPS
1.0000 "application " | OPHTHALMIC | Status: DC | PRN
  Administered 2021-01-19 (×3): 1 via OPHTHALMIC

## 2021-01-19 MED ORDER — LACTATED RINGERS IV SOLN
INTRAVENOUS | Status: DC
Start: 2021-01-19 — End: 2021-01-19

## 2021-01-19 MED ORDER — TETRACAINE HCL 0.5 % OP SOLN
1.0000 [drp] | OPHTHALMIC | Status: DC | PRN
  Administered 2021-01-19 (×3): 1 [drp] via OPHTHALMIC

## 2021-01-19 MED ORDER — SIGHTPATH DOSE#1 SODIUM HYALURONATE 23 MG/ML IO SOLUTION
PREFILLED_SYRINGE | INTRAOCULAR | Status: DC | PRN
  Administered 2021-01-19: 0.6 mL via INTRAOCULAR

## 2021-01-19 MED ORDER — FENTANYL CITRATE (PF) 100 MCG/2ML IJ SOLN
INTRAMUSCULAR | Status: DC | PRN
Start: 2021-01-19 — End: 2021-01-19
  Administered 2021-01-19: 50 ug via INTRAVENOUS

## 2021-01-19 MED ORDER — SIGHTPATH DOSE#1 BSS IO SOLN
INTRAOCULAR | Status: DC | PRN
  Administered 2021-01-19: 15 mL

## 2021-01-19 MED ORDER — ONDANSETRON HCL 4 MG/2ML IJ SOLN: 4.0000 mg | Freq: Once | INTRAMUSCULAR | Status: DC | PRN

## 2021-01-19 MED ORDER — MIDAZOLAM HCL 2 MG/2ML IJ SOLN
INTRAMUSCULAR | Status: DC | PRN
  Administered 2021-01-19: 1 mg via INTRAVENOUS

## 2021-01-19 MED ORDER — ACETAMINOPHEN 500 MG PO TABS: 1000.0000 mg | ORAL_TABLET | Freq: Once | ORAL | Status: DC | PRN

## 2021-01-19 MED ORDER — SIGHTPATH DOSE#1 SODIUM HYALURONATE 10 MG/ML IO SOLUTION
PREFILLED_SYRINGE | INTRAOCULAR | Status: DC | PRN
  Administered 2021-01-19: 0.85 mL via INTRAOCULAR

## 2021-01-19 SURGICAL SUPPLY — 14 items
CATARACT SUITE SIGHTPATH (MISCELLANEOUS) ×2 IMPLANT
DISSECTOR HYDRO NUCLEUS 50X22 (MISCELLANEOUS) ×2 IMPLANT
FEE CATARACT SUITE SIGHTPATH (MISCELLANEOUS) ×1 IMPLANT
GLOVE SURG GAMMEX PI TX LF 7.5 (GLOVE) ×2 IMPLANT
GLOVE SURG SYN 8.5  E (GLOVE) ×1
GLOVE SURG SYN 8.5 E (GLOVE) ×1 IMPLANT
GLOVE SURG SYN 8.5 PF PI (GLOVE) ×1 IMPLANT
LENS IOL TECNIS EYHANCE 21.5 (Intraocular Lens) ×1 IMPLANT
NDL FILTER BLUNT 18X1 1/2 (NEEDLE) ×1 IMPLANT
NEEDLE FILTER BLUNT 18X 1/2SAF (NEEDLE) ×1
NEEDLE FILTER BLUNT 18X1 1/2 (NEEDLE) ×1 IMPLANT
SYR 3ML LL SCALE MARK (SYRINGE) ×2 IMPLANT
SYR 5ML LL (SYRINGE) ×2 IMPLANT
WATER STERILE IRR 250ML POUR (IV SOLUTION) ×2 IMPLANT

## 2021-01-19 NOTE — Anesthesia Procedure Notes (Signed)
Procedure Name: MAC Date/Time: 01/19/2021 12:07 PM Performed by: Cameron Ali, CRNA Pre-anesthesia Checklist: Patient identified, Emergency Drugs available, Suction available, Timeout performed and Patient being monitored Patient Re-evaluated:Patient Re-evaluated prior to induction Oxygen Delivery Method: Nasal cannula Placement Confirmation: positive ETCO2

## 2021-01-19 NOTE — Op Note (Signed)
OPERATIVE NOTE  Luke Mckee [id number] 01/19/2021   PREOPERATIVE DIAGNOSIS:  Nuclear sclerotic cataract left eye.  H25.12   POSTOPERATIVE DIAGNOSIS:    Nuclear sclerotic cataract left eye.     PROCEDURE:  Phacoemusification with posterior chamber intraocular lens placement of the left eye   LENS:   Implant Name Type Inv. Item Serial No. Manufacturer Lot No. LRB No. Used Action  LENS IOL TECNIS EYHANCE 21.5 - [id number] Intraocular Lens LENS IOL TECNIS EYHANCE 21.5 [id number] [employer]  Left 1 Implanted      Procedure(s): CATARACT EXTRACTION PHACO AND INTRAOCULAR LENS PLACEMENT (IOC) LEFT 3.09 00:28.3 (Left)  DIB00 +21.5   ULTRASOUND TIME: 0 minutes 28 seconds.  CDE 3.09   SURGEON:  Willey Blade, MD, MPH   ANESTHESIA:  Topical with tetracaine drops augmented with 1% preservative-free intracameral lidocaine.  ESTIMATED BLOOD LOSS: <1 mL   COMPLICATIONS:  None.   DESCRIPTION OF PROCEDURE:  The patient was identified in the holding room and transported to the operating room and placed in the supine position under the operating microscope.  The left eye was identified as the operative eye and it was prepped and draped in the usual sterile ophthalmic fashion.   A 1.0 millimeter clear-corneal paracentesis was made at the 5:00 position. 0.5 ml of preservative-free 1% lidocaine with epinephrine was injected into the anterior chamber.  The anterior chamber was filled with Healon 5 viscoelastic.  A 2.4 millimeter keratome was used to make a near-clear corneal incision at the 2:00 position.  A curvilinear capsulorrhexis was made with a cystotome and capsulorrhexis forceps.  Balanced salt solution was used to hydrodissect and hydrodelineate the nucleus.   Phacoemulsification was then used in stop and chop fashion to remove the lens nucleus and epinucleus.  The remaining cortex was then removed using the irrigation and aspiration handpiece. Healon was then placed into the capsular bag to  distend it for lens placement.  A lens was then injected into the capsular bag.  The remaining viscoelastic was aspirated.   Wounds were hydrated with balanced salt solution.  The anterior chamber was inflated to a physiologic pressure with balanced salt solution.  Intracameral vigamox 0.1 mL undiltued was injected into the eye and a drop placed onto the ocular surface.  No wound leaks were noted.  The patient was taken to the recovery room in stable condition without complications of anesthesia or surgery  Willey Blade 01/19/2021, 12:18 PM

## 2021-01-19 NOTE — Transfer of Care (Signed)
Immediate Anesthesia Transfer of Care Note  Patient: Luke Mckee  Procedure(s) Performed: CATARACT EXTRACTION PHACO AND INTRAOCULAR LENS PLACEMENT (IOC) LEFT 3.09 00:28.3 (Left: Eye)  Patient Location: PACU  Anesthesia Type: MAC  Level of Consciousness: awake, alert  and patient cooperative  Airway and Oxygen Therapy: Patient Spontanous Breathing and Patient connected to supplemental oxygen  Post-op Assessment: Post-op Vital signs reviewed, [name] Cardiovascular Status Stable, Respiratory Function Stable, Patent Airway and No signs of Nausea or vomiting  Post-op Vital Signs: Reviewed and stable  Complications: No notable events documented.

## 2021-01-19 NOTE — Anesthesia Postprocedure Evaluation (Signed)
Anesthesia Post Note  Patient: Luke Mckee  Procedure(s) Performed: CATARACT EXTRACTION PHACO AND INTRAOCULAR LENS PLACEMENT (IOC) LEFT 3.09 00:28.3 (Left: Eye)     Patient location during evaluation: PACU Anesthesia Type: MAC Level of consciousness: awake and alert Pain management: pain level controlled Vital Signs Assessment: post-procedure vital signs reviewed and stable Respiratory status: spontaneous breathing, nonlabored ventilation and respiratory function stable Cardiovascular status: stable and blood pressure returned to baseline Postop Assessment: no apparent nausea or vomiting Anesthetic complications: no   No notable events documented.  April Manson

## 2021-01-19 NOTE — H&P (Signed)
[facility]   Primary Care Physician:  Olin Hauser, DO Ophthalmologist: Dr. Benay Pillow  Pre-Procedure History & Physical: HPI:  Luke Mckee is a 81 y.o. male here for cataract surgery.   Past Medical History:  Diagnosis Date   Anxiety    Arthritis    CAD (coronary artery disease) 2005   s/p CABG, DES   CHF (congestive heart failure) (HCC)    in EPIC care everywhere   Chronic kidney disease    COPD (chronic obstructive pulmonary disease) ([place])    in EPIC care everywhere   Depression    Heart murmur    History of stroke    Hypothyroidism    OSA (obstructive sleep apnea)    Paroxysmal A-fib ([place])    in EPIC careeverywhere    Past Surgical History:  Procedure Laterality Date   APPENDECTOMY     BUNIONECTOMY     CATARACT EXTRACTION     Right eye   COLONOSCOPY  07/06/2004   CORONARY ARTERY BYPASS GRAFT  2005   LOOP RECORDER INSERTION N/A 07/25/2018   Procedure: LOOP RECORDER INSERTION;  Surgeon: Constance Haw, MD;  Location: [place] [facility];  Service: Cardiovascular;  Laterality: N/A;   TOTAL HIP ARTHROPLASTY Right 11/23/2011   TOTAL HIP ARTHROPLASTY Left 09/07/2011   TOTAL KNEE ARTHROPLASTY Right 02/15/2018   VASECTOMY  1978    Prior to Admission medications   Medication Sig Start Date End Date Taking? Authorizing Provider  acetaminophen (TYLENOL) 650 MG CR tablet Take 1,300 mg by mouth at bedtime as needed.   Yes [provider]  albuterol (VENTOLIN HFA) 108 (90 Base) MCG/ACT inhaler Inhale 1-2 puffs into the lungs every 4 (four) hours as needed for wheezing or shortness of breath (cough). 11/03/20  Yes Karamalegos, Devonne Doughty, DO  aspirin 81 MG chewable tablet Chew 81 mg by mouth daily.   Yes [provider]  atenolol (TENORMIN) 50 MG tablet Take 1 tablet (50 mg total) by mouth at bedtime. 06/30/20  Yes Karamalegos, Devonne Doughty, DO  atorvastatin (LIPITOR) 80 MG tablet Take 1 tablet (80 mg total) by mouth daily. 06/30/20   Yes Karamalegos, Devonne Doughty, DO  Cholecalciferol (VITAMIN D) 50 MCG (2000 UT) tablet Take 4,000 Units by mouth daily.   Yes [provider]  clopidogrel (PLAVIX) 75 MG tablet TAKE 1 TABLET(75 MG) BY MOUTH DAILY 04/03/20  Yes Gollan, Kathlene November, MD  co-enzyme Q-10 30 MG capsule Take 100 mg by mouth daily.   Yes [provider]  docusate sodium (COLACE) 100 MG capsule Take 200 mg by mouth daily. 02/16/18  Yes [provider]  furosemide (LASIX) 80 MG tablet Take 1 tablet (80 mg total) by mouth 2 (two) times daily. 06/30/20  Yes Karamalegos, Devonne Doughty, DO  isosorbide mononitrate (IMDUR) 30 MG 24 hr tablet Take 1 tablet (30 mg total) by mouth daily. 06/30/20  Yes Karamalegos, Devonne Doughty, DO  levothyroxine (SYNTHROID) 125 MCG tablet Take 1 tablet (125 mcg total) by mouth daily before breakfast. 06/30/20  Yes Karamalegos, Devonne Doughty, DO  loratadine (CLARITIN) 10 MG tablet Take 10 mg by mouth daily.   Yes [provider]  LORazepam (ATIVAN) 0.5 MG tablet Take 1 tablet (0.5 mg total) by mouth daily as needed for anxiety or sleep. 10/16/20  Yes Karamalegos, Alexander J, DO  montelukast (SINGULAIR) 10 MG tablet Take 1 tablet (10 mg total) by mouth at bedtime. 04/16/20  Yes Karamalegos, Devonne Doughty, DO  Multiple Vitamin (MULTIVITAMIN) tablet  Take 1 tablet by mouth daily.   Yes [provider]  senna (SENOKOT) 8.6 MG tablet Take 1 tablet by mouth daily.   Yes [provider]  sertraline (ZOLOFT) 100 MG tablet Take 2 tablets (200 mg total) by mouth daily. 10/16/20  Yes Karamalegos, Devonne Doughty, DO  spironolactone (ALDACTONE) 25 MG tablet Take 0.5 tablets (12.5 mg total) by mouth daily. 10/16/20  Yes Karamalegos, Devonne Doughty, DO  traZODone (DESYREL) 50 MG tablet Take 1 tablet (50 mg total) by mouth at bedtime. 12/31/20  Yes Karamalegos, Devonne Doughty, DO  clotrimazole-betamethasone (LOTRISONE) cream Apply twice a day for 1-2 weeks, may repeat if need in future 06/30/20    Olin Hauser, DO  diclofenac sodium (VOLTAREN) 1 % GEL Apply 2 g topically 4 (four) times daily as needed for pain. Foot pain 01/16/18   [provider]  nitroGLYCERIN (NITROSTAT) 0.4 MG SL tablet Place 1 tablet (0.4 mg total) under the tongue every 5 (five) minutes as needed for chest pain. 10/16/20   Karamalegos, Alexander J, DO  PATADAY 0.1 % ophthalmic solution Place into both eyes as needed.  11/20/18   [provider]    Allergies as of 12/19/2020 - Review Complete 10/16/2020  Allergen Reaction Noted   Ace inhibitors  06/07/2018   Cephalexin  09/27/2018   Crestor [rosuvastatin calcium] Other (See Comments) 07/23/2018   Tape Rash 02/09/2018    Family History  Problem Relation Age of Onset   Anxiety disorder Sister     Social History   Socioeconomic History   Marital status: Married    Spouse name: Not on file   Number of children: Not on file   Years of education: Not on file   Highest education level: Not on file  Occupational History   Occupation: retired  Tobacco Use   Smoking status: Never   Smokeless tobacco: Never  Vaping Use   Vaping Use: Never used  Substance and Sexual Activity   Alcohol use: Not Currently    Comment: past   Drug use: Never   Sexual activity: Not on file  Other Topics Concern   Not on file  Social History Narrative   Not on file   Social Determinants of Health   Financial Resource Strain: Low Risk    Difficulty of Paying Living Expenses: Not hard at all  Food Insecurity: No Food Insecurity   Worried About Charity fundraiser in the Last Year: Never true   [place] in the Last Year: Never true  Transportation Needs: No Transportation Needs   Lack of Transportation (Medical): No   Lack of Transportation (Non-Medical): No  Physical Activity: Sufficiently Active   Days of Exercise per Week: 5 days   Minutes of Exercise per Session: 30 min  Stress: No Stress Concern Present   Feeling of Stress : Not  at all  Social Connections: Moderately Integrated   Frequency of Communication with Friends and Family: More than three times a week   Frequency of Social Gatherings with Friends and Family: More than three times a week   Attends Religious Services: More than 4 times per year   Active Member of [employer] or Organizations: No   Attends Archivist Meetings: Never   Marital Status: Married  Human resources officer Violence: Not At Risk   Fear of Current or Ex-Partner: No   Emotionally Abused: No   Physically Abused: No   Sexually Abused: No    Review of Systems: See  HPI, otherwise negative ROS  Physical Exam: BP 127/72    Pulse 73    Temp 97.7 F (36.5 C) (Temporal)    Ht 5\' 8"  (1.727 m)    Wt 110.7 kg    SpO2 93%    BMI 37.10 kg/m  General:   Alert, cooperative in NAD Head:  Normocephalic and atraumatic. Respiratory:  Normal work of breathing. Cardiovascular:  RRR  Impression/Plan: Luke Mckee is here for cataract surgery.  Risks, benefits, limitations, and alternatives regarding cataract surgery have been reviewed with the patient.  Questions have been answered.  All parties agreeable.   Benay Pillow, MD  01/19/2021, 11:50 AM

## 2021-01-19 NOTE — Anesthesia Preprocedure Evaluation (Addendum)
Anesthesia Evaluation  Patient identified by MRN, date of birth, ID band Patient awake    Reviewed: Allergy & Precautions, H&P , NPO status , [name] Chart, lab work & pertinent test results, reviewed documented beta blocker date and time   Airway Mallampati: III  TM Distance: >3 FB Neck ROM: full    Dental no notable dental hx.    Pulmonary neg pulmonary ROS, sleep apnea , COPD,    Pulmonary exam normal breath sounds clear to auscultation       Cardiovascular Exercise Tolerance: Good hypertension, + CAD, + Cardiac Stents, + CABG and +CHF  negative cardio ROS  + dysrhythmias (afib)  Rhythm:regular Rate:Normal     Neuro/Psych TIAnegative neurological ROS  negative psych ROS   GI/Hepatic negative GI ROS, Neg liver ROS, GERD  ,  Endo/Other  negative endocrine ROSHypothyroidism   Renal/GU CRFRenal diseasenegative Renal ROS  negative genitourinary   Musculoskeletal  (+) Arthritis ,   Abdominal   Peds  Hematology negative hematology ROS (+)   Anesthesia Other Findings   Reproductive/Obstetrics negative OB ROS                            Anesthesia Physical Anesthesia Plan  ASA: 3  Anesthesia Plan: MAC   Post-op Pain Management:    Induction:   PONV Risk Score and Plan: 1 and TIVA, Midazolam and Treatment may vary due to age or medical condition  Airway Management Planned:   Additional Equipment:   Intra-op Plan:   Post-operative Plan:   Informed Consent: I have reviewed the patients History and Physical, chart, labs and discussed the procedure including the risks, benefits and alternatives for the proposed anesthesia with the patient or authorized representative who has indicated his/her understanding and acceptance.     Dental Advisory Given  Plan Discussed with: CRNA  Anesthesia Plan Comments:         Anesthesia Quick Evaluation

## 2021-01-20 ENCOUNTER — Encounter: Payer: Self-pay | Admitting: Ophthalmology

## 2021-01-21 ENCOUNTER — Telehealth: Payer: Medicare PPO

## 2021-01-21 ENCOUNTER — Telehealth: Payer: Self-pay | Admitting: Pharmacist

## 2021-01-21 NOTE — Telephone Encounter (Signed)
°  Chronic Care Management   Outreach Note  01/21/2021 Name: Luke Mckee MRN: [id number] DOB: 1940-08-20  Referred by: [name], DO Reason for referral : No chief complaint on file.   Was unable to reach patient via telephone today and have left HIPAA compliant voicemail asking patient to return my call.    Follow Up Plan: CCM Pharmacist will attempt to reach patient by telephone on 01/23/2021  Estelle Grumbles, PharmD, [facility] Clinical Pharmacist [facility] Care Management [phone]

## 2021-01-23 ENCOUNTER — Ambulatory Visit (INDEPENDENT_AMBULATORY_CARE_PROVIDER_SITE_OTHER): Payer: Medicare PPO | Admitting: Pharmacist

## 2021-01-23 DIAGNOSIS — I10 Essential (primary) hypertension: Secondary | ICD-10-CM

## 2021-01-23 NOTE — Patient Instructions (Signed)
Visit Information  Thank you for taking time to visit with me today. Please don't hesitate to contact me if I can be of assistance to you before our next scheduled telephone appointment.  Following are the goals we discussed today:   Goals Addressed             This Visit's Progress    Pharmacy Goals       It was great talking with you today!  Please check your blood pressure 1-2 times/week at home and keep a record to bring to medical appointments  Our goal bad cholesterol, or LDL, is less than 70 . This is why it is important to continue taking your atorvastatin  Feel free to call me with any questions or concerns. I look forward to our next call!    Wallace Cullens, PharmD, [place] [phone]           Our next appointment is by telephone on 4/12 at 1 pm  Please call the care guide team at [phone] if you need to cancel or reschedule your appointment.    The patient verbalized understanding of instructions, educational materials, and care plan provided today and declined offer to receive copy of patient instructions, educational materials, and care plan.

## 2021-01-23 NOTE — Chronic Care Management (AMB) (Signed)
Chronic Care Management CCM Pharmacy Note  01/23/2021 Name:  Luke Mckee MRN:  [id number] DOB:  Feb 11, 1940  Subjective: Luke Mckee is an 81 y.o. year old male who is a primary patient of [name], DO.  The CCM team was consulted for assistance with disease management and care coordination needs.    Engaged with patient by telephone for follow up visit for pharmacy case management and/or care coordination services.   Objective:  Medications Reviewed Today     Reviewed by Manuela Neptune, RPH-CPP (Pharmacist) on 01/23/21 at 1224  Med List Status: <None>   Medication Order Taking? Sig Documenting Provider Last Dose Status Informant  acetaminophen (TYLENOL) 650 MG CR tablet [id number]  Take 1,300 mg by mouth at bedtime as needed. [provider]  Active Spouse/Significant Other  albuterol (VENTOLIN HFA) 108 (90 Base) MCG/ACT inhaler [id number] No Inhale 1-2 puffs into the lungs every 4 (four) hours as needed for wheezing or shortness of breath (cough).  Patient not taking: Reported on 01/23/2021   [name], DO Not Taking Active   aspirin 81 MG chewable tablet [id number]  Chew 81 mg by mouth daily. [provider]  Active   atenolol (TENORMIN) 50 MG tablet [id number]  Take 1 tablet (50 mg total) by mouth at bedtime. [name], DO  Active   atorvastatin (LIPITOR) 80 MG tablet [id number]  Take 1 tablet (80 mg total) by mouth daily. [name], DO  Active   Cholecalciferol (VITAMIN D) 50 MCG (2000 UT) tablet [id number]  Take 4,000 Units by mouth daily. [provider]  Active Spouse/Significant Other  clopidogrel (PLAVIX) 75 MG tablet [id number]  TAKE 1 TABLET(75 MG) BY MOUTH DAILY Gollan, Tollie Pizza, MD  Active   clotrimazole-betamethasone (LOTRISONE) cream [id number]  Apply twice a day for 1-2 weeks, may repeat if need in future [name], DO  Active   co-enzyme Q-10 30 MG capsule [id number]  Take  100 mg by mouth daily. [provider]  Active   diclofenac sodium (VOLTAREN) 1 % GEL [id number]  Apply 2 g topically 4 (four) times daily as needed for pain. Foot pain [provider]  Active Spouse/Significant Other  docusate sodium (COLACE) 100 MG capsule [id number]  Take 200 mg by mouth daily. [provider]  Active Spouse/Significant Other  furosemide (LASIX) 80 MG tablet [id number]  Take 1 tablet (80 mg total) by mouth 2 (two) times daily. Karamalegos, Netta [name], DO  Active   isosorbide mononitrate (IMDUR) 30 MG 24 hr tablet [id number]  Take 1 tablet (30 mg total) by mouth daily. [name], DO  Active   levothyroxine (SYNTHROID) 125 MCG tablet [id number]  Take 1 tablet (125 mcg total) by mouth daily before breakfast. [name], DO  Active   loratadine (CLARITIN) 10 MG tablet [id number]  Take 10 mg by mouth daily. [provider]  Active   LORazepam (ATIVAN) 0.5 MG tablet [id number]  Take 1 tablet (0.5 mg total) by mouth daily as needed for anxiety or sleep. Karamalegos, Netta [name], DO  Active   montelukast (SINGULAIR) 10 MG tablet [id number]  Take 1 tablet (10 mg total) by mouth at bedtime. [name], DO  Active   Multiple Vitamin (MULTIVITAMIN) tablet [id number]  Take 1 tablet by mouth daily. [provider]  Active Spouse/Significant Other  nitroGLYCERIN (NITROSTAT) 0.4 MG SL tablet [id number]  Place 1 tablet (0.4 mg total) under the tongue every 5 (five) minutes as needed for  chest pain. Karamalegos, Netta [name], DO  Active   PATADAY 0.1 % ophthalmic solution [id number]  Place into both eyes as needed.  [provider]  Active   senna (SENOKOT) 8.6 MG tablet [id number]  Take 1 tablet by mouth daily. [provider]  Active   sertraline (ZOLOFT) 100 MG tablet [id number]  Take 2 tablets (200 mg total) by mouth daily. [name], DO  Active   spironolactone (ALDACTONE) 25 MG tablet  [id number]  Take 0.5 tablets (12.5 mg total) by mouth daily. [name], DO  Active   traZODone (DESYREL) 50 MG tablet [id number] Yes Take 1 tablet (50 mg total) by mouth at bedtime. [name], DO Taking Active             Pertinent Labs:   Lab Results  Component Value Date   CHOL 130 12/24/2020   HDL 36 (L) 12/24/2020   LDLCALC 71 12/24/2020   TRIG 152 (H) 12/24/2020   CHOLHDL 3.6 12/24/2020   Lab Results  Component Value Date   CREATININE 0.93 12/24/2020   BUN 18 12/24/2020   NA 141 12/24/2020   K 4.1 12/24/2020   CL 102 12/24/2020   CO2 30 12/24/2020   Lab Results  Component Value Date   TSH 1.81 12/24/2020    BP Readings from Last 3 Encounters:  01/19/21 101/81  12/31/20 124/64  10/16/20 129/62   Pulse Readings from Last 3 Encounters:  01/19/21 63  12/31/20 66  10/16/20 65     SDOH:  (Social Determinants of Health) assessments and interventions performed:    CCM Care Plan  Review of patient past medical history, allergies, medications, health status, including review of consultants reports, laboratory and other test data, was performed as part of comprehensive evaluation and provision of chronic care management services.   Care Plan : PharmD - Med Management  Updates made by Manuela Neptune, RPH-CPP since 01/23/2021 12:00 AM     Problem: Disease Progression      Long-Range Goal: Disease Progression Prevented or Minimized   Start Date: 02/04/2020  Expected End Date: 05/04/2020  This Visit's Progress: On track  Recent Progress: On track  Priority: High  Note:   Current Barriers:  Chronic Disease Management support, education, and care coordination needs related to CAD, HTN, HLD, and hx CVA  Pharmacist Clinical Goal(s):  Over the next 90 days, patient will adhere to plan to optimize therapeutic regimen for hypothyroidism as evidenced by report of adherence to recommended medication management changes through  collaboration with PharmD and provider.    Interventions: 1:1 collaboration with [name], DO regarding development and update of comprehensive plan of care as evidenced by provider attestation and co-signature Inter-disciplinary care team collaboration (see longitudinal plan of care) Perform chart review.  Patient admitted to [facility] on 01/19/2021 for left cataract surgery Office Visit with PCP on 12/21. Provider advised patient  Start trazodone 50 mg nightly at bedtime Call [facility] to follow-up with Dermatology for skin surveillance Today patient reports sleep has improved with taking trazodone 50 mg nightly as directed by PCP Encourage to follow up with Dermatology as recommended by PCP  Seasonal allergies: Reports allergies currently well-controlled Current treatment: Loratadine 10 mg daily Montelukast 10 mg QHS  Hypothyroidism Controled; current treatment: levothyroxine 125 mcg daily before breakfast Have counseled to take levothyroxine consistently in the morning on an empty stomach, at least 30 minutes before food and at least 4 hours from calcium-  or iron-containing products using new weekly pill organizer  Hypertension Controlled; current treatment: Atenolol 50 mg daily at bedtime Furosemide 80 mg twice daily Isosorbide ER 30 mg once daily Spironolactone 25 mg - 1/2 tablet (12.5 mg) once daily Denies monitoring home blood pressure recently Have encouraged to monitor home BP 1-2 times/week, keep log of results and call providers for readings outside of established parameters.   Patient Goals/Self-Care Activities Over the next 90 days, patient will:  - take medications as prescribed with assistance of wife   Note: patient uses weekly pillbox as filled by wife - check blood pressure, document, and provide at future appointments  Follow Up Plan: Telephone follow up appointment with care management team member scheduled for: 4/12 at  1 pm       Estelle GrumblesElisabeth Kei Langhorst, PharmD, [place]ACP, CPP Clinical Pharmacist [facility] [place] [phone]

## 2021-01-26 ENCOUNTER — Ambulatory Visit (INDEPENDENT_AMBULATORY_CARE_PROVIDER_SITE_OTHER): Payer: Medicare PPO

## 2021-01-26 ENCOUNTER — Telehealth: Payer: Medicare PPO

## 2021-01-26 ENCOUNTER — Telehealth: Payer: Self-pay

## 2021-01-26 DIAGNOSIS — I63412 Cerebral infarction due to embolism of left middle cerebral artery: Secondary | ICD-10-CM | POA: Diagnosis not present

## 2021-01-26 LAB — CUP PACEART REMOTE DEVICE CHECK
Date Time Interrogation Session: 20230115232959
Implantable Pulse Generator Implant Date: 20200714

## 2021-01-26 NOTE — Telephone Encounter (Signed)
°  Care Management   Follow Up Note   01/26/2021 Name: Luke Mckee MRN: [id number] DOB: Mar 30, 1940   Referred by: [name], DO Reason for referral : Chronic Care Management (RNCM: Follow up for Chronic Disease Management and Care Coordination Needs )   An unsuccessful telephone outreach was attempted today. The patient was referred to the case management team for assistance with care management and care coordination.   Follow Up Plan: A HIPPA compliant phone message was left for the patient providing contact information and requesting a return call.   Alto Denver RN, MSN, CCM Community Care Coordinator [place]   [facility] [place] Mobile: [phone]

## 2021-02-05 NOTE — Progress Notes (Signed)
Carelink Summary Report / Loop Recorder 

## 2021-02-10 DIAGNOSIS — I10 Essential (primary) hypertension: Secondary | ICD-10-CM | POA: Diagnosis not present

## 2021-02-10 DIAGNOSIS — E039 Hypothyroidism, unspecified: Secondary | ICD-10-CM

## 2021-02-28 LAB — CUP PACEART REMOTE DEVICE CHECK
Date Time Interrogation Session: 20230217233025
Implantable Pulse Generator Implant Date: 20200714

## 2021-03-02 ENCOUNTER — Ambulatory Visit (INDEPENDENT_AMBULATORY_CARE_PROVIDER_SITE_OTHER): Payer: Medicare PPO

## 2021-03-02 DIAGNOSIS — I63412 Cerebral infarction due to embolism of left middle cerebral artery: Secondary | ICD-10-CM

## 2021-03-05 ENCOUNTER — Ambulatory Visit (INDEPENDENT_AMBULATORY_CARE_PROVIDER_SITE_OTHER): Payer: Medicare PPO

## 2021-03-05 ENCOUNTER — Telehealth: Payer: Medicare PPO

## 2021-03-05 DIAGNOSIS — I10 Essential (primary) hypertension: Secondary | ICD-10-CM

## 2021-03-05 DIAGNOSIS — F3341 Major depressive disorder, recurrent, in partial remission: Secondary | ICD-10-CM

## 2021-03-05 DIAGNOSIS — I5022 Chronic systolic (congestive) heart failure: Secondary | ICD-10-CM

## 2021-03-05 DIAGNOSIS — E782 Mixed hyperlipidemia: Secondary | ICD-10-CM

## 2021-03-05 DIAGNOSIS — R296 Repeated falls: Secondary | ICD-10-CM

## 2021-03-05 NOTE — Chronic Care Management (AMB) (Signed)
Chronic Care Management   CCM RN Visit Note  03/05/2021 Name: Luke Mckee MRN: [id number] DOB: [birth year]  Subjective: Luke Mckee is a 81 y.o. year old male who is a primary care patient of Olin Hauser, DO. The care management team was consulted for assistance with disease management and care coordination needs.    Engaged with patient by telephone for follow up visit in response to provider referral for case management and/or care coordination services.   Consent to Services:  The patient was given information about Chronic Care Management services, agreed to services, and gave verbal consent prior to initiation of services.  Please see initial visit note for detailed documentation.   Patient agreed to services and verbal consent obtained.   Assessment: Review of patient past medical history, allergies, medications, health status, including review of consultants reports, laboratory and other test data, was performed as part of comprehensive evaluation and provision of chronic care management services.   SDOH (Social Determinants of Health) assessments and interventions performed:    CCM Care Plan  Allergies  Allergen Reactions   Ace Inhibitors    Cephalexin     Vomiting and diarrhea   Crestor [Rosuvastatin Calcium] Other (See Comments)    myalgia   Tape Rash    blistering    Outpatient Encounter Medications as of 03/05/2021  Medication Sig   acetaminophen (TYLENOL) 650 MG CR tablet Take 1,300 mg by mouth at bedtime as needed.   albuterol (VENTOLIN HFA) 108 (90 Base) MCG/ACT inhaler Inhale 1-2 puffs into the lungs every 4 (four) hours as needed for wheezing or shortness of breath (cough). (Patient not taking: Reported on 01/23/2021)   aspirin 81 MG chewable tablet Chew 81 mg by mouth daily.   atenolol (TENORMIN) 50 MG tablet Take 1 tablet (50 mg total) by mouth at bedtime.   atorvastatin (LIPITOR) 80 MG tablet Take 1 tablet (80 mg total) by mouth daily.    Cholecalciferol (VITAMIN D) 50 MCG (2000 UT) tablet Take 4,000 Units by mouth daily.   clopidogrel (PLAVIX) 75 MG tablet TAKE 1 TABLET(75 MG) BY MOUTH DAILY   clotrimazole-betamethasone (LOTRISONE) cream Apply twice a day for 1-2 weeks, may repeat if need in future   co-enzyme Q-10 30 MG capsule Take 100 mg by mouth daily.   diclofenac sodium (VOLTAREN) 1 % GEL Apply 2 g topically 4 (four) times daily as needed for pain. Foot pain   docusate sodium (COLACE) 100 MG capsule Take 200 mg by mouth daily.   furosemide (LASIX) 80 MG tablet Take 1 tablet (80 mg total) by mouth 2 (two) times daily.   isosorbide mononitrate (IMDUR) 30 MG 24 hr tablet Take 1 tablet (30 mg total) by mouth daily.   levothyroxine (SYNTHROID) 125 MCG tablet Take 1 tablet (125 mcg total) by mouth daily before breakfast.   loratadine (CLARITIN) 10 MG tablet Take 10 mg by mouth daily.   LORazepam (ATIVAN) 0.5 MG tablet Take 1 tablet (0.5 mg total) by mouth daily as needed for anxiety or sleep.   montelukast (SINGULAIR) 10 MG tablet Take 1 tablet (10 mg total) by mouth at bedtime.   Multiple Vitamin (MULTIVITAMIN) tablet Take 1 tablet by mouth daily.   nitroGLYCERIN (NITROSTAT) 0.4 MG SL tablet Place 1 tablet (0.4 mg total) under the tongue every 5 (five) minutes as needed for chest pain.   PATADAY 0.1 % ophthalmic solution Place into both eyes as needed.    senna (SENOKOT) 8.6 MG tablet Take 1 tablet by mouth  daily.   sertraline (ZOLOFT) 100 MG tablet Take 2 tablets (200 mg total) by mouth daily.   spironolactone (ALDACTONE) 25 MG tablet Take 0.5 tablets (12.5 mg total) by mouth daily.   traZODone (DESYREL) 50 MG tablet Take 1 tablet (50 mg total) by mouth at bedtime.   No facility-administered encounter medications on file as of 03/05/2021.    Patient Active Problem List   Diagnosis Date Noted   Hallux limitus, acquired, right 11/12/2019   Pre-diabetes 10/17/2019   Recurrent major depressive disorder, in partial remission  ([place]) 04/16/2019   Morbid obesity ([place]) 04/16/2019   Vasomotor rhinitis 10/25/2018   Hearing loss 08/28/2018   Osteoarthritis 08/28/2018   Tinnitus 08/28/2018   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 ([place]) 07/31/2018   TIA (transient ischemic attack) 07/23/2018   Slurred speech 07/23/2018   Right arm weakness 07/23/2018   Hypokalemia 07/23/2018   Anxiety 07/20/2018   Atherosclerosis of native coronary artery of native heart with stable angina pectoris ([place]) 06/16/2018   Hx of CABG 06/16/2018   Mixed hyperlipidemia 06/16/2018   Benign essential HTN 06/16/2018   History of cerebrovascular accident (CVA) with residual deficit 80/16/5537   Systolic CHF ([place]) 48/27/0786   Hypertension 02/15/2018   S/P total knee arthroplasty, right 02/15/2018   Primary osteoarthritis of both knees 11/25/2017   Abnormal nuclear stress test 06/30/2016   Hallux limitus of right foot 10/22/2015   Anesthesia complication 75/44/9201   GERD (gastroesophageal reflux disease) 10/10/2015   Hypothyroidism 10/10/2015   OSA on CPAP 01/10/2007   Diagnosis unknown 01/12/2003    Conditions to be addressed/monitored:CHF, HTN, HLD, Depression, and Falls   Care Plan : RNCM: General Plan of Care (Adult) for Chronic Disease Management and Care Coordination Needs  Updates made by Vanita Ingles, RN since 03/05/2021 12:00 AM     Problem: RNCM: Development of Plan of Care For Chronic Disease Management (HF, HTN, HLD, Depression, Falls)   Priority: High     Long-Range Goal: RNCM: Effective Management of Plan of Care For Chronic Disease Management (HF, HTN, HLD, Depression, Falls)   Start Date: 11/24/2020  Expected End Date: 11/24/2021  Priority: High  Note:   Current Barriers:  Knowledge Deficits related to plan of care for management of CHF, Depression: depressed mood, and falls prevention and safety   Chronic Disease Management support and education needs related to CHF, HTN, HLD,  Depression: depressed mood, and falls and safety  RNCM Clinical Goal(s):  Patient will verbalize understanding of plan for management of CHF, HTN, HLD, Depression, and falls and safety as evidenced by following the plan of care, compliance with medications, compliance with dietary restrictions  take all medications exactly as prescribed and will call provider for medication related questions as evidenced by compliance with medications and calling for needed refills before running out    attend all scheduled medical appointments: 07-03-2021 at 200 pm as evidenced by keeping appointments and calling if appointment needs to be rescheduled        demonstrate improved and ongoing adherence to prescribed treatment plan for CHF, HTN, HLD, Depression, and Falls and safety preventions as evidenced by working with the CCM team and pcp to effectively manage chronic conditions  demonstrate a decrease in CHF, HTN, HLD, Depression, and falls and safety exacerbations  as evidenced by stable conditions, calling for changes, and working with the CCM team to optimize health and well being demonstrate ongoing self health care management ability effective management of chronic conditions  as evidenced by  working with the CCM team through collaboration with Consulting civil engineer, provider, and care team.   Interventions: 1:1 collaboration with primary care provider regarding development and update of comprehensive plan of care as evidenced by provider attestation and co-signature Inter-disciplinary care team collaboration (see longitudinal plan of care) Evaluation of current treatment plan related to  self management and patient's adherence to plan as established by provider   SDOH Barriers (Status: Goal on Track (progressing): YES.) Long Term Goal  Patient interviewed and SDOH assessment performed        Patient interviewed and appropriate assessments performed Provided patient with information about resources available  and the care guides being available to assist with changes or new needs Discussed plans with patient for ongoing care management follow up and provided patient with direct contact information for care management team Advised patient to call for changes in SDOH, questions and concerns    Heart Failure Interventions:  (Status: Goal on Track (progressing): YES.)  Long Term Goal  Basic overview and discussion of pathophysiology of Heart Failure reviewed Provided education on low sodium diet. 03-05-2021: Is compliant with a heart healthy diet  Reviewed Heart Failure Action Plan in depth and provided written copy Assessed need for readable accurate scales in home Provided education about placing scale on hard, flat surface Advised patient to weigh each morning after emptying bladder Discussed importance of daily weight and advised patient to weigh and record daily Reviewed role of diuretics in prevention of fluid overload and management of heart failure Discussed the importance of keeping all appointments with provider Provided patient with education about the role of exercise in the management of heart failure Advised patient to discuss new changes, edema more than usual with provider  Depression   (Status: Goal on Track (progressing): YES.) Long Term Goal  Evaluation of current treatment plan related to Depression, Mental Health Concerns  self-management and patient's adherence to plan as established by provider. 01-08-2021: The patient states he feels "Out of it today". The patients wife states he seemed to be uptight earlier and not feeling well so she gave him some anxiety medications and that has helped him settle down. She states they were busy and over at their daughters a lot during Christmas. The patient denies any acute distress. Empathetic listening and support given. 03-05-2021: The patient states he is doing well and denies any acute changes in his depression or mental health status. He  states that he had good results from his cataract surgery. States he is resting well and eating well. Likes the weather we are having currently. Will continue to monitor.  Discussed plans with patient for ongoing care management follow up and provided patient with direct contact information for care management team Advised patient to call for changes in mood, anxiety, depression; Provided education to patient re: staying positive, being around positive people, and reporting new changes in depression; Reviewed medications with patient and discussed compliance; Reviewed scheduled/upcoming provider appointments including 07-03-2021 at 2 pm; Discussed plans with patient for ongoing care management follow up and provided patient with direct contact information for care management team; Advised patient to discuss changes in depression with provider; Screening for signs and symptoms of depression related to chronic disease state;  Assessed social determinant of health barriers;  11-27-2020: Call made back to the patient and spoke to the patients wife Arbie Cookey, the patient has a place on his nose that comes up and goes back down. The patient and wife were  asking about referral to dermatologist. Collaboration with the pcp and the patient saw Dr. Nehemiah Massed in the summer of 2021 and had places removed. The patient should not need another referral. Information provided to the patient and the patients wife. The number to call and make an appointment is [phone]. Reviewed with the patient and the patients wife and understanding was verbalized. 03-05-2021: The patient has been to the dermatologist and is doing well. No new changes.   Hyperlipidemia:  (Status: Goal on Track (progressing): YES.) Long Term Goal  Lab Results  Component Value Date   CHOL 130 12/24/2020   HDL 36 (L) 12/24/2020   LDLCALC 71 12/24/2020   TRIG 152 (H) 12/24/2020   CHOLHDL 3.6 12/24/2020     Medication review performed; medication list  updated in electronic medical record. 03-05-2021: The patient takes Lipitor 80 mg daily. Provider established cholesterol goals reviewed. 01-08-2021: Review of goals of cholesterol levels Counseled on importance of regular laboratory monitoring as prescribed. 03-05-2021: Review of regular labwork. The patient has on a regular basis Provided HLD educational materials; Reviewed role and benefits of statin for ASCVD risk reduction; Discussed strategies to manage statin-induced myalgias; Reviewed importance of limiting foods high in cholesterol; Reviewed exercise goals and target of 150 minutes per week; Screening for signs and symptoms of depression related to chronic disease state;  Assessed social determinant of health barriers;  Review of pharm D notes and outreach on 11-26-2020 for medication questions the patients wife had with interactions and making sure what the patient was taking was safe. 03-05-2021: The patient and patients wife work with the pharm D on a consistent basis.   Hypertension: (Status: Goal on Track (progressing): YES.) Last practice recorded BP readings:  BP Readings from Last 3 Encounters:  01/19/21 101/81  12/31/20 124/64  10/16/20 129/62  Most recent eGFR/CrCl: No results found for: EGFR  No components found for: CRCL  Evaluation of current treatment plan related to hypertension self management and patient's adherence to plan as established by provider. 01-08-2021: The patient has good blood pressures. Today during the call his blood pressure was 123/63 with a pulse of 70 and 128/76 with pulse of 75;  He states today was not the best of days but he is feeling better now. 03-05-2021: The patient denies any issues with HTN or heart health. Will continue to monitor for changes Provided education to patient re: stroke prevention, s/s of heart attack and stroke; Reviewed prescribed diet heart healthy Reviewed medications with patient and discussed importance of compliance;   Discussed plans with patient for ongoing care management follow up and provided patient with direct contact information for care management team; Advised patient, providing education and rationale, to monitor blood pressure daily and record, calling PCP for findings outside established parameters;  Advised patient to discuss changes in blood pressure trends with provider; Provided education on prescribed diet heart healthy;  Discussed complications of poorly controlled blood pressure such as heart disease, stroke, circulatory complications, vision complications, kidney impairment, sexual dysfunction;    Falls:  (Status: Goal on Track (progressing): YES.) Long Term Goal  Provided written and verbal education re: potential causes of falls and Fall prevention strategies Reviewed medications and discussed potential side effects of medications such as dizziness and frequent urination Advised patient of importance of notifying provider of falls. 03-05-2021: The patient had a fall today and reported to the [facility] for signs and symptoms of orthostatic hypotension Assessed for falls since last encounter. 11-24-2020: last reported fall at  10-07-2020. 01-08-2021: Denies any new falls. Is being safe. Will continue to monitor. 03-05-2021: Had a fall today in the bedroom. Was getting things from the closet and tripped over the dog steps. The patient denies any injuries or hitting head. The patient states his wife came and assisted him and he is doing well today. Denies pain or discomfort.  Assessed patients knowledge of fall risk prevention secondary to previously provided education Provided patient information for fall alert systems Assessed working status of life alert bracelet and patient adherence Advised patient to discuss new falls with provider. 03-05-2021: Education provided on fall prevention and safety. Ask the patient to use his cane or his walker when ambulating. The patient states he does not  always do this. Education and support given.   Patient Goals/Self-Care Activities: Patient will self administer medications as prescribed as evidenced by self report/primary caregiver report  Patient will attend all scheduled provider appointments as evidenced by clinician review of documented attendance to scheduled appointments and patient/caregiver report Patient will call pharmacy for medication refills as evidenced by patient report and review of pharmacy fill history as appropriate Patient will attend church or other social activities as evidenced by patient report Patient will continue to perform ADL's independently as evidenced by patient/caregiver report Patient will continue to perform IADL's independently as evidenced by patient/caregiver report Patient will call provider office for new concerns or questions as evidenced by review of documented incoming telephone call notes and patient report Patient will work with BSW to address care coordination needs and will continue to work with the clinical team to address health care and disease management related needs as evidenced by documented adherence to scheduled care management/care coordination appointments call office if I gain more than 2 pounds in one day or 5 pounds in one week keep legs up while sitting track weight in diary use salt in moderation watch for swelling in feet, ankles and legs every day weigh myself daily develop a rescue plan follow rescue plan if symptoms flare-up eat more whole grains, fruits and vegetables, lean meats and healthy fats know when to call the doctorfor changes in sx and sx of HF exacerbation  track symptoms and what helps feel better or worse dress right for the weather, hot or cold - check blood pressure weekly - choose a place to take my blood pressure (home, clinic or office, retail store) - write blood pressure results in a log or diary - learn about high blood pressure - keep a blood  pressure log - take blood pressure log to all doctor appointments - call doctor for signs and symptoms of high blood pressure - develop an action plan for high blood pressure - keep all doctor appointments - take medications for blood pressure exactly as prescribed - report new symptoms to your doctor - eat more whole grains, fruits and vegetables, lean meats and healthy fats - call for medicine refill 2 or 3 days before it runs out - take all medications exactly as prescribed - call doctor with any symptoms you believe are related to your medicine - call doctor when you experience any new symptoms - go to all doctor appointments as scheduled - adhere to prescribed diet: heart healthy diet        Plan:Telephone follow up appointment with care management team member scheduled for:  04-09-2021 at 145 pm  [place], MSN, [place] Mobile: [phone]

## 2021-03-05 NOTE — Patient Instructions (Signed)
Visit Information  Thank you for taking time to visit with me today. Please don't hesitate to contact me if I can be of assistance to you before our next scheduled telephone appointment.  Following are the goals we discussed today:  RNCM Clinical Goal(s):  Patient will verbalize understanding of plan for management of CHF, HTN, HLD, Depression, and falls and safety as evidenced by following the plan of care, compliance with medications, compliance with dietary restrictions  take all medications exactly as prescribed and will call provider for medication related questions as evidenced by compliance with medications and calling for needed refills before running out    attend all scheduled medical appointments: 07-03-2021 at 200 pm as evidenced by keeping appointments and calling if appointment needs to be rescheduled        demonstrate improved and ongoing adherence to prescribed treatment plan for CHF, HTN, HLD, Depression, and Falls and safety preventions as evidenced by working with the CCM team and pcp to effectively manage chronic conditions  demonstrate a decrease in CHF, HTN, HLD, Depression, and falls and safety exacerbations  as evidenced by stable conditions, calling for changes, and working with the CCM team to optimize health and well being demonstrate ongoing self health care management ability effective management of chronic conditions as evidenced by  working with the CCM team through collaboration with Consulting civil engineer, provider, and care team.    Interventions: 1:1 collaboration with primary care provider regarding development and update of comprehensive plan of care as evidenced by provider attestation and co-signature Inter-disciplinary care team collaboration (see longitudinal plan of care) Evaluation of current treatment plan related to  self management and patient's adherence to plan as established by provider     SDOH Barriers (Status: Goal on Track (progressing): YES.) Long  Term Goal  Patient interviewed and SDOH assessment performed        Patient interviewed and appropriate assessments performed Provided patient with information about resources available and the care guides being available to assist with changes or new needs Discussed plans with patient for ongoing care management follow up and provided patient with direct contact information for care management team Advised patient to call for changes in SDOH, questions and concerns       Heart Failure Interventions:  (Status: Goal on Track (progressing): YES.)  Long Term Goal  Basic overview and discussion of pathophysiology of Heart Failure reviewed Provided education on low sodium diet. 03-05-2021: Is compliant with a heart healthy diet  Reviewed Heart Failure Action Plan in depth and provided written copy Assessed need for readable accurate scales in home Provided education about placing scale on hard, flat surface Advised patient to weigh each morning after emptying bladder Discussed importance of daily weight and advised patient to weigh and record daily Reviewed role of diuretics in prevention of fluid overload and management of heart failure Discussed the importance of keeping all appointments with provider Provided patient with education about the role of exercise in the management of heart failure Advised patient to discuss new changes, edema more than usual with provider   Depression   (Status: Goal on Track (progressing): YES.) Long Term Goal  Evaluation of current treatment plan related to Depression, Mental Health Concerns  self-management and patient's adherence to plan as established by provider. 01-08-2021: The patient states he feels "Out of it today". The patients wife states he seemed to be uptight earlier and not feeling well so she gave him some anxiety medications and that has helped him  settle down. She states they were busy and over at their daughters a lot during Christmas. The  patient denies any acute distress. Empathetic listening and support given. 03-05-2021: The patient states he is doing well and denies any acute changes in his depression or mental health status. He states that he had good results from his cataract surgery. States he is resting well and eating well. Likes the weather we are having currently. Will continue to monitor.  Discussed plans with patient for ongoing care management follow up and provided patient with direct contact information for care management team Advised patient to call for changes in mood, anxiety, depression; Provided education to patient re: staying positive, being around positive people, and reporting new changes in depression; Reviewed medications with patient and discussed compliance; Reviewed scheduled/upcoming provider appointments including 07-03-2021 at 2 pm; Discussed plans with patient for ongoing care management follow up and provided patient with direct contact information for care management team; Advised patient to discuss changes in depression with provider; Screening for signs and symptoms of depression related to chronic disease state;  Assessed social determinant of health barriers;  11-27-2020: Call made back to the patient and spoke to the patients wife Arbie Cookey, the patient has a place on his nose that comes up and goes back down. The patient and wife were asking about referral to dermatologist. Collaboration with the pcp and the patient saw Dr. Nehemiah Massed in the summer of 2021 and had places removed. The patient should not need another referral. Information provided to the patient and the patients wife. The number to call and make an appointment is [phone]. Reviewed with the patient and the patients wife and understanding was verbalized. 03-05-2021: The patient has been to the dermatologist and is doing well. No new changes.    Hyperlipidemia:  (Status: Goal on Track (progressing): YES.) Long Term Goal       Lab  Results  Component Value Date    CHOL 130 12/24/2020    HDL 36 (L) 12/24/2020    LDLCALC 71 12/24/2020    TRIG 152 (H) 12/24/2020    CHOLHDL 3.6 12/24/2020      Medication review performed; medication list updated in electronic medical record. 03-05-2021: The patient takes Lipitor 80 mg daily. Provider established cholesterol goals reviewed. 01-08-2021: Review of goals of cholesterol levels Counseled on importance of regular laboratory monitoring as prescribed. 03-05-2021: Review of regular labwork. The patient has on a regular basis Provided HLD educational materials; Reviewed role and benefits of statin for ASCVD risk reduction; Discussed strategies to manage statin-induced myalgias; Reviewed importance of limiting foods high in cholesterol; Reviewed exercise goals and target of 150 minutes per week; Screening for signs and symptoms of depression related to chronic disease state;  Assessed social determinant of health barriers;  Review of pharm D notes and outreach on 11-26-2020 for medication questions the patients wife had with interactions and making sure what the patient was taking was safe. 03-05-2021: The patient and patients wife work with the pharm D on a consistent basis.    Hypertension: (Status: Goal on Track (progressing): YES.) Last practice recorded BP readings:     BP Readings from Last 3 Encounters:  01/19/21 101/81  12/31/20 124/64  10/16/20 129/62  Most recent eGFR/CrCl: No results found for: EGFR  No components found for: CRCL   Evaluation of current treatment plan related to hypertension self management and patient's adherence to plan as established by provider. 01-08-2021: The patient has good blood pressures. Today during the  call his blood pressure was 123/63 with a pulse of 70 and 128/76 with pulse of 75;  He states today was not the best of days but he is feeling better now. 03-05-2021: The patient denies any issues with HTN or heart health. Will continue to  monitor for changes Provided education to patient re: stroke prevention, s/s of heart attack and stroke; Reviewed prescribed diet heart healthy Reviewed medications with patient and discussed importance of compliance;  Discussed plans with patient for ongoing care management follow up and provided patient with direct contact information for care management team; Advised patient, providing education and rationale, to monitor blood pressure daily and record, calling PCP for findings outside established parameters;  Advised patient to discuss changes in blood pressure trends with provider; Provided education on prescribed diet heart healthy;  Discussed complications of poorly controlled blood pressure such as heart disease, stroke, circulatory complications, vision complications, kidney impairment, sexual dysfunction;      Falls:  (Status: Goal on Track (progressing): YES.) Long Term Goal  Provided written and verbal education re: potential causes of falls and Fall prevention strategies Reviewed medications and discussed potential side effects of medications such as dizziness and frequent urination Advised patient of importance of notifying provider of falls. 03-05-2021: The patient had a fall today and reported to the [facility] for signs and symptoms of orthostatic hypotension Assessed for falls since last encounter. 11-24-2020: last reported fall at 10-07-2020. 01-08-2021: Denies any new falls. Is being safe. Will continue to monitor. 03-05-2021: Had a fall today in the bedroom. Was getting things from the closet and tripped over the dog steps. The patient denies any injuries or hitting head. The patient states his wife came and assisted him and he is doing well today. Denies pain or discomfort.  Assessed patients knowledge of fall risk prevention secondary to previously provided education Provided patient information for fall alert systems Assessed working status of life alert bracelet and  patient adherence Advised patient to discuss new falls with provider. 03-05-2021: Education provided on fall prevention and safety. Ask the patient to use his cane or his walker when ambulating. The patient states he does not always do this. Education and support given.    Patient Goals/Self-Care Activities: Patient will self administer medications as prescribed as evidenced by self report/primary caregiver report  Patient will attend all scheduled provider appointments as evidenced by clinician review of documented attendance to scheduled appointments and patient/caregiver report Patient will call pharmacy for medication refills as evidenced by patient report and review of pharmacy fill history as appropriate Patient will attend church or other social activities as evidenced by patient report Patient will continue to perform ADL's independently as evidenced by patient/caregiver report Patient will continue to perform IADL's independently as evidenced by patient/caregiver report Patient will call provider office for new concerns or questions as evidenced by review of documented incoming telephone call notes and patient report Patient will work with BSW to address care coordination needs and will continue to work with the clinical team to address health care and disease management related needs as evidenced by documented adherence to scheduled care management/care coordination appointments call office if I gain more than 2 pounds in one day or 5 pounds in one week keep legs up while sitting track weight in diary use salt in moderation watch for swelling in feet, ankles and legs every day weigh myself daily develop a rescue plan follow rescue plan if symptoms flare-up eat more whole grains, fruits and vegetables,  lean meats and healthy fats know when to call the doctorfor changes in sx and sx of HF exacerbation  track symptoms and what helps feel better or worse dress right for the weather, hot  or cold - check blood pressure weekly - choose a place to take my blood pressure (home, clinic or office, retail store) - write blood pressure results in a log or diary - learn about high blood pressure - keep a blood pressure log - take blood pressure log to all doctor appointments - call doctor for signs and symptoms of high blood pressure - develop an action plan for high blood pressure - keep all doctor appointments - take medications for blood pressure exactly as prescribed - report new symptoms to your doctor - eat more whole grains, fruits and vegetables, lean meats and healthy fats - call for medicine refill 2 or 3 days before it runs out - take all medications exactly as prescribed - call doctor with any symptoms you believe are related to your medicine - call doctor when you experience any new symptoms - go to all doctor appointments as scheduled - adhere to prescribed diet: heart healthy diet     Our next appointment is by telephone on 04-09-2021 at 145 pm  Please call the care guide team at [phone] if you need to cancel or reschedule your appointment.   If you are experiencing a Mental Health or [place] or need someone to talk to, please call the Suicide and Crisis Lifeline: 988 call the [place] National Suicide Prevention Lifeline: (312)852-5550 or TTY: 931 039 1438 TTY 936 554 8508) to talk to a trained counselor call 1-800-273-TALK (toll free, 24 hour hotline)   Patient verbalizes understanding of instructions and care plan provided today and agrees to view in [facility]. Active MyChart status confirmed with patient.    Noreene Larsson RN, MSN, [place] Mobile: [phone]

## 2021-03-06 NOTE — Progress Notes (Signed)
Carelink Summary Report / Loop Recorder 

## 2021-03-10 DIAGNOSIS — E782 Mixed hyperlipidemia: Secondary | ICD-10-CM

## 2021-03-10 DIAGNOSIS — F3341 Major depressive disorder, recurrent, in partial remission: Secondary | ICD-10-CM

## 2021-03-10 DIAGNOSIS — I5022 Chronic systolic (congestive) heart failure: Secondary | ICD-10-CM | POA: Diagnosis not present

## 2021-03-10 DIAGNOSIS — I10 Essential (primary) hypertension: Secondary | ICD-10-CM | POA: Diagnosis not present

## 2021-03-16 ENCOUNTER — Ambulatory Visit (INDEPENDENT_AMBULATORY_CARE_PROVIDER_SITE_OTHER): Payer: Medicare PPO

## 2021-03-16 DIAGNOSIS — F3341 Major depressive disorder, recurrent, in partial remission: Secondary | ICD-10-CM

## 2021-03-16 DIAGNOSIS — I10 Essential (primary) hypertension: Secondary | ICD-10-CM

## 2021-03-16 DIAGNOSIS — G4733 Obstructive sleep apnea (adult) (pediatric): Secondary | ICD-10-CM

## 2021-03-16 DIAGNOSIS — Z9989 Dependence on other enabling machines and devices: Secondary | ICD-10-CM

## 2021-03-16 NOTE — Chronic Care Management (AMB) (Signed)
Chronic Care Management   CCM RN Visit Note  03/16/2021 Name: Luke Mckee MRN: [id number] DOB: 1940-11-10  Subjective: Luke Mckee is a 81 y.o. year old male who is a primary care patient of Olin Hauser, DO. The care management team was consulted for assistance with disease management and care coordination needs.    Engaged with patient by telephone for follow up visit in response to provider referral for case management and/or care coordination services.   Consent to Services:  The patient was given information about Chronic Care Management services, agreed to services, and gave verbal consent prior to initiation of services.  Please see initial visit note for detailed documentation.   Patient agreed to services and verbal consent obtained.   Assessment: Review of patient past medical history, allergies, medications, health status, including review of consultants reports, laboratory and other test data, was performed as part of comprehensive evaluation and provision of chronic care management services.   SDOH (Social Determinants of Health) assessments and interventions performed:    CCM Care Plan  Allergies  Allergen Reactions   Ace Inhibitors    Cephalexin     Vomiting and diarrhea   Crestor [Rosuvastatin Calcium] Other (See Comments)    myalgia   Tape Rash    blistering    Outpatient Encounter Medications as of 03/16/2021  Medication Sig   acetaminophen (TYLENOL) 650 MG CR tablet Take 1,300 mg by mouth at bedtime as needed.   albuterol (VENTOLIN HFA) 108 (90 Base) MCG/ACT inhaler Inhale 1-2 puffs into the lungs every 4 (four) hours as needed for wheezing or shortness of breath (cough). (Patient not taking: Reported on 01/23/2021)   aspirin 81 MG chewable tablet Chew 81 mg by mouth daily.   atenolol (TENORMIN) 50 MG tablet Take 1 tablet (50 mg total) by mouth at bedtime.   atorvastatin (LIPITOR) 80 MG tablet Take 1 tablet (80 mg total) by mouth daily.    Cholecalciferol (VITAMIN D) 50 MCG (2000 UT) tablet Take 4,000 Units by mouth daily.   clopidogrel (PLAVIX) 75 MG tablet TAKE 1 TABLET(75 MG) BY MOUTH DAILY   clotrimazole-betamethasone (LOTRISONE) cream Apply twice a day for 1-2 weeks, may repeat if need in future   co-enzyme Q-10 30 MG capsule Take 100 mg by mouth daily.   diclofenac sodium (VOLTAREN) 1 % GEL Apply 2 g topically 4 (four) times daily as needed for pain. Foot pain   docusate sodium (COLACE) 100 MG capsule Take 200 mg by mouth daily.   furosemide (LASIX) 80 MG tablet Take 1 tablet (80 mg total) by mouth 2 (two) times daily.   isosorbide mononitrate (IMDUR) 30 MG 24 hr tablet Take 1 tablet (30 mg total) by mouth daily.   levothyroxine (SYNTHROID) 125 MCG tablet Take 1 tablet (125 mcg total) by mouth daily before breakfast.   loratadine (CLARITIN) 10 MG tablet Take 10 mg by mouth daily.   LORazepam (ATIVAN) 0.5 MG tablet Take 1 tablet (0.5 mg total) by mouth daily as needed for anxiety or sleep.   montelukast (SINGULAIR) 10 MG tablet Take 1 tablet (10 mg total) by mouth at bedtime.   Multiple Vitamin (MULTIVITAMIN) tablet Take 1 tablet by mouth daily.   nitroGLYCERIN (NITROSTAT) 0.4 MG SL tablet Place 1 tablet (0.4 mg total) under the tongue every 5 (five) minutes as needed for chest pain.   PATADAY 0.1 % ophthalmic solution Place into both eyes as needed.    senna (SENOKOT) 8.6 MG tablet Take 1 tablet by mouth  daily.   sertraline (ZOLOFT) 100 MG tablet Take 2 tablets (200 mg total) by mouth daily.   spironolactone (ALDACTONE) 25 MG tablet Take 0.5 tablets (12.5 mg total) by mouth daily.   traZODone (DESYREL) 50 MG tablet Take 1 tablet (50 mg total) by mouth at bedtime.   No facility-administered encounter medications on file as of 03/16/2021.    Patient Active Problem List   Diagnosis Date Noted   Hallux limitus, acquired, right 11/12/2019   Pre-diabetes 10/17/2019   Recurrent major depressive disorder, in partial remission  ([place]) 04/16/2019   Morbid obesity ([place]) 04/16/2019   Vasomotor rhinitis 10/25/2018   Hearing loss 08/28/2018   Osteoarthritis 08/28/2018   Tinnitus 08/28/2018   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 ([place]) 07/31/2018   TIA (transient ischemic attack) 07/23/2018   Slurred speech 07/23/2018   Right arm weakness 07/23/2018   Hypokalemia 07/23/2018   Anxiety 07/20/2018   Atherosclerosis of native coronary artery of native heart with stable angina pectoris ([place]) 06/16/2018   Hx of CABG 06/16/2018   Mixed hyperlipidemia 06/16/2018   Benign essential HTN 06/16/2018   History of cerebrovascular accident (CVA) with residual deficit 40/34/7425   Systolic CHF ([place]) 95/63/8756   Hypertension 02/15/2018   S/P total knee arthroplasty, right 02/15/2018   Primary osteoarthritis of both knees 11/25/2017   Abnormal nuclear stress test 06/30/2016   Hallux limitus of right foot 10/22/2015   Anesthesia complication 43/32/9518   GERD (gastroesophageal reflux disease) 10/10/2015   Hypothyroidism 10/10/2015   OSA on CPAP 01/10/2007   Diagnosis unknown 01/12/2003    Conditions to be addressed/monitored:HTN, Depression, and OSA  Care Plan : RNCM: General Plan of Care (Adult) for Chronic Disease Management and Care Coordination Needs  Updates made by Vanita Ingles, RN since 03/16/2021 12:00 AM     Problem: RNCM: Development of Plan of Care For Chronic Disease Management (HF, HTN, HLD, Depression, Falls)   Priority: High     Long-Range Goal: RNCM: Effective Management of Plan of Care For Chronic Disease Management (HF, HTN, HLD, Depression, Falls)   Start Date: 11/24/2020  Expected End Date: 11/24/2021  Priority: High  Note:   Current Barriers:  Knowledge Deficits related to plan of care for management of CHF, Depression: depressed mood, and falls prevention and safety   Chronic Disease Management support and education needs related to CHF, HTN, HLD, Depression: depressed  mood, and falls and safety  RNCM Clinical Goal(s):  Patient will verbalize understanding of plan for management of CHF, HTN, HLD, Depression, and falls and safety as evidenced by following the plan of care, compliance with medications, compliance with dietary restrictions  take all medications exactly as prescribed and will call provider for medication related questions as evidenced by compliance with medications and calling for needed refills before running out    attend all scheduled medical appointments: 07-03-2021 at 200 pm as evidenced by keeping appointments and calling if appointment needs to be rescheduled        demonstrate improved and ongoing adherence to prescribed treatment plan for CHF, HTN, HLD, Depression, and Falls and safety preventions as evidenced by working with the CCM team and pcp to effectively manage chronic conditions  demonstrate a decrease in CHF, HTN, HLD, Depression, and falls and safety exacerbations  as evidenced by stable conditions, calling for changes, and working with the CCM team to optimize health and well being demonstrate ongoing self health care management ability effective management of chronic conditions as evidenced by  working with the CCM team through collaboration with Consulting civil engineer, provider, and care team.   Interventions: 1:1 collaboration with primary care provider regarding development and update of comprehensive plan of care as evidenced by provider attestation and co-signature Inter-disciplinary care team collaboration (see longitudinal plan of care) Evaluation of current treatment plan related to  self management and patient's adherence to plan as established by provider   SDOH Barriers (Status: Goal on Track (progressing): YES.) Long Term Goal  Patient interviewed and SDOH assessment performed        Patient interviewed and appropriate assessments performed Provided patient with information about resources available and the care guides  being available to assist with changes or new needs Discussed plans with patient for ongoing care management follow up and provided patient with direct contact information for care management team Advised patient to call for changes in SDOH, questions and concerns    Heart Failure Interventions:  (Status: Goal on Track (progressing): YES.)  Long Term Goal  Basic overview and discussion of pathophysiology of Heart Failure reviewed Provided education on low sodium diet. 03-05-2021: Is compliant with a heart healthy diet  Reviewed Heart Failure Action Plan in depth and provided written copy Assessed need for readable accurate scales in home Provided education about placing scale on hard, flat surface Advised patient to weigh each morning after emptying bladder Discussed importance of daily weight and advised patient to weigh and record daily Reviewed role of diuretics in prevention of fluid overload and management of heart failure Discussed the importance of keeping all appointments with provider Provided patient with education about the role of exercise in the management of heart failure Advised patient to discuss new changes, edema more than usual with provider  Depression   (Status: Goal on Track (progressing): YES.) Long Term Goal  Evaluation of current treatment plan related to Depression, Mental Health Concerns  self-management and patient's adherence to plan as established by provider. 01-08-2021: The patient states he feels "Out of it today". The patients wife states he seemed to be uptight earlier and not feeling well so she gave him some anxiety medications and that has helped him settle down. She states they were busy and over at their daughters a lot during Christmas. The patient denies any acute distress. Empathetic listening and support given. 03-05-2021: The patient states he is doing well and denies any acute changes in his depression or mental health status. He states that he had good  results from his cataract surgery. States he is resting well and eating well. Likes the weather we are having currently. Will continue to monitor. 03-16-2021: The patient is concerned because his CPAP machine is not working properly and has not been the last several nights. He and his wife called to illicit the help of the [facility]. The patient states that he does not know what is wrong with it. See OSA plan of care for more information.  Discussed plans with patient for ongoing care management follow up and provided patient with direct contact information for care management team Advised patient to call for changes in mood, anxiety, depression; Provided education to patient re: staying positive, being around positive people, and reporting new changes in depression; Reviewed medications with patient and discussed compliance; Reviewed scheduled/upcoming provider appointments including 07-03-2021 at 2 pm; Discussed plans with patient for ongoing care management follow up and provided patient with direct contact information for care management team; Advised patient to discuss changes in depression with provider; Screening for signs and  symptoms of depression related to chronic disease state;  Assessed social determinant of health barriers;  11-27-2020: Call made back to the patient and spoke to the patients wife Arbie Cookey, the patient has a place on his nose that comes up and goes back down. The patient and wife were asking about referral to dermatologist. Collaboration with the pcp and the patient saw Dr. Nehemiah Massed in the summer of 2021 and had places removed. The patient should not need another referral. Information provided to the patient and the patients wife. The number to call and make an appointment is [phone]. Reviewed with the patient and the patients wife and understanding was verbalized. 03-05-2021: The patient has been to the dermatologist and is doing well. No new changes.   Hyperlipidemia:  (Status:  Goal on Track (progressing): YES.) Long Term Goal  Lab Results  Component Value Date   CHOL 130 12/24/2020   HDL 36 (L) 12/24/2020   LDLCALC 71 12/24/2020   TRIG 152 (H) 12/24/2020   CHOLHDL 3.6 12/24/2020     Medication review performed; medication list updated in electronic medical record. 03-05-2021: The patient takes Lipitor 80 mg daily. Provider established cholesterol goals reviewed. 01-08-2021: Review of goals of cholesterol levels Counseled on importance of regular laboratory monitoring as prescribed. 03-05-2021: Review of regular labwork. The patient has on a regular basis Provided HLD educational materials; Reviewed role and benefits of statin for ASCVD risk reduction; Discussed strategies to manage statin-induced myalgias; Reviewed importance of limiting foods high in cholesterol; Reviewed exercise goals and target of 150 minutes per week; Screening for signs and symptoms of depression related to chronic disease state;  Assessed social determinant of health barriers;  Review of pharm D notes and outreach on 11-26-2020 for medication questions the patients wife had with interactions and making sure what the patient was taking was safe. 03-05-2021: The patient and patients wife work with the pharm D on a consistent basis.   Hypertension: (Status: Goal on Track (progressing): YES.) Last practice recorded BP readings:  BP Readings from Last 3 Encounters:  01/19/21 101/81  12/31/20 124/64  10/16/20 129/62  Most recent eGFR/CrCl: No results found for: EGFR  No components found for: CRCL  Evaluation of current treatment plan related to hypertension self management and patient's adherence to plan as established by provider. 01-08-2021: The patient has good blood pressures. Today during the call his blood pressure was 123/63 with a pulse of 70 and 128/76 with pulse of 75;  He states today was not the best of days but he is feeling better now. 03-16-2021: The patient denies any issues with  HTN or heart health. Will continue to monitor for changes Provided education to patient re: stroke prevention, s/s of heart attack and stroke; Reviewed prescribed diet heart healthy Reviewed medications with patient and discussed importance of compliance;  Discussed plans with patient for ongoing care management follow up and provided patient with direct contact information for care management team; Advised patient, providing education and rationale, to monitor blood pressure daily and record, calling PCP for findings outside established parameters;  Advised patient to discuss changes in blood pressure trends with provider; Provided education on prescribed diet heart healthy;  Discussed complications of poorly controlled blood pressure such as heart disease, stroke, circulatory complications, vision complications, kidney impairment, sexual dysfunction;    Falls:  (Status: Goal on Track (progressing): YES.) Long Term Goal  Provided written and verbal education re: potential causes of falls and Fall prevention strategies Reviewed medications and discussed potential  side effects of medications such as dizziness and frequent urination Advised patient of importance of notifying provider of falls. 03-05-2021: The patient had a fall today and reported to the [place] for signs and symptoms of orthostatic hypotension Assessed for falls since last encounter. 11-24-2020: last reported fall at 10-07-2020. 01-08-2021: Denies any new falls. Is being safe. Will continue to monitor. 03-05-2021: Had a fall today in the bedroom. Was getting things from the closet and tripped over the dog steps. The patient denies any injuries or hitting head. The patient states his wife came and assisted him and he is doing well today. Denies pain or discomfort.  Assessed patients knowledge of fall risk prevention secondary to previously provided education Provided patient information for fall alert systems Assessed working  status of life alert bracelet and patient adherence Advised patient to discuss new falls with provider. 03-05-2021: Education provided on fall prevention and safety. Ask the patient to use his cane or his walker when ambulating. The patient states he does not always do this. Education and support given.    OSA with CPAP  (Status: New goal.) Long Term Goal  Evaluation of current treatment plan related to  OSA ,  self-management and patient's adherence to plan as established by provider. Discussed plans with patient for ongoing care management follow up and provided patient with direct contact information for care management team Advised patient to call [facility] and talk to the provider there about his cpap equipment. The patient sees Chipper Herb at [facility] and last saw her in March of last year. The patient and his wife were concerned that they needed a referral. Education provided. The cpap equipment that the patient uses is different from what his wife has. He states it has not been working correctly x 1 week. The patient advised to call the clinic and discuss recommendations for what he needed to do to get the help he needed for his cpap machine. Education and support given; Provided education to patient re: review of steps to take to get the needed help for trouble- shooting the issue related to problems he is having with his CPAP machine. ; Discussed plans with patient for ongoing care management follow up and provided patient with direct contact information for care management team; Advised patient to discuss new concerns or questions about his OSA and sleep apnea machine with provider;   Patient Goals/Self-Care Activities: Patient will self administer medications as prescribed as evidenced by self report/primary caregiver report  Patient will attend all scheduled provider appointments as evidenced by clinician review of documented attendance to scheduled appointments and patient/caregiver report Patient  will call pharmacy for medication refills as evidenced by patient report and review of pharmacy fill history as appropriate Patient will attend church or other social activities as evidenced by patient report Patient will continue to perform ADL's independently as evidenced by patient/caregiver report Patient will continue to perform IADL's independently as evidenced by patient/caregiver report Patient will call provider office for new concerns or questions as evidenced by review of documented incoming telephone call notes and patient report Patient will work with BSW to address care coordination needs and will continue to work with the clinical team to address health care and disease management related needs as evidenced by documented adherence to scheduled care management/care coordination appointments call office if I gain more than 2 pounds in one day or 5 pounds in one week keep legs up while sitting track weight in diary use salt in moderation watch  for swelling in feet, ankles and legs every day weigh myself daily develop a rescue plan follow rescue plan if symptoms flare-up eat more whole grains, fruits and vegetables, lean meats and healthy fats know when to call the doctorfor changes in sx and sx of HF exacerbation  track symptoms and what helps feel better or worse dress right for the weather, hot or cold - check blood pressure weekly - choose a place to take my blood pressure (home, clinic or office, retail store) - write blood pressure results in a log or diary - learn about high blood pressure - keep a blood pressure log - take blood pressure log to all doctor appointments - call doctor for signs and symptoms of high blood pressure - develop an action plan for high blood pressure - keep all doctor appointments - take medications for blood pressure exactly as prescribed - report new symptoms to your doctor - eat more whole grains, fruits and vegetables, lean meats and  healthy fats - call for medicine refill 2 or 3 days before it runs out - take all medications exactly as prescribed - call doctor with any symptoms you believe are related to your medicine - call doctor when you experience any new symptoms - go to all doctor appointments as scheduled - adhere to prescribed diet: heart healthy diet        Plan:Telephone follow up appointment with care management team member scheduled for:  04-09-2021 at 145 pm  [place], MSN, [place] Mobile: [phone]

## 2021-03-16 NOTE — Patient Instructions (Signed)
Visit Information  Thank you for taking time to visit with me today. Please don't hesitate to contact me if I can be of assistance to you before our next scheduled telephone appointment.  Following are the goals we discussed today:  RNCM Clinical Goal(s):  Patient will verbalize understanding of plan for management of CHF, HTN, HLD, Depression, and falls and safety as evidenced by following the plan of care, compliance with medications, compliance with dietary restrictions  take all medications exactly as prescribed and will call provider for medication related questions as evidenced by compliance with medications and calling for needed refills before running out    attend all scheduled medical appointments: 07-03-2021 at 200 pm as evidenced by keeping appointments and calling if appointment needs to be rescheduled        demonstrate improved and ongoing adherence to prescribed treatment plan for CHF, HTN, HLD, Depression, and Falls and safety preventions as evidenced by working with the CCM team and pcp to effectively manage chronic conditions  demonstrate a decrease in CHF, HTN, HLD, Depression, and falls and safety exacerbations  as evidenced by stable conditions, calling for changes, and working with the CCM team to optimize health and well being demonstrate ongoing self health care management ability effective management of chronic conditions as evidenced by  working with the CCM team through collaboration with Consulting civil engineer, provider, and care team.    Interventions: 1:1 collaboration with primary care provider regarding development and update of comprehensive plan of care as evidenced by provider attestation and co-signature Inter-disciplinary care team collaboration (see longitudinal plan of care) Evaluation of current treatment plan related to  self management and patient's adherence to plan as established by provider     SDOH Barriers (Status: Goal on Track (progressing): YES.) Long  Term Goal  Patient interviewed and SDOH assessment performed        Patient interviewed and appropriate assessments performed Provided patient with information about resources available and the care guides being available to assist with changes or new needs Discussed plans with patient for ongoing care management follow up and provided patient with direct contact information for care management team Advised patient to call for changes in SDOH, questions and concerns       Heart Failure Interventions:  (Status: Goal on Track (progressing): YES.)  Long Term Goal  Basic overview and discussion of pathophysiology of Heart Failure reviewed Provided education on low sodium diet. 03-05-2021: Is compliant with a heart healthy diet  Reviewed Heart Failure Action Plan in depth and provided written copy Assessed need for readable accurate scales in home Provided education about placing scale on hard, flat surface Advised patient to weigh each morning after emptying bladder Discussed importance of daily weight and advised patient to weigh and record daily Reviewed role of diuretics in prevention of fluid overload and management of heart failure Discussed the importance of keeping all appointments with provider Provided patient with education about the role of exercise in the management of heart failure Advised patient to discuss new changes, edema more than usual with provider   Depression   (Status: Goal on Track (progressing): YES.) Long Term Goal  Evaluation of current treatment plan related to Depression, Mental Health Concerns  self-management and patient's adherence to plan as established by provider. 01-08-2021: The patient states he feels "Out of it today". The patients wife states he seemed to be uptight earlier and not feeling well so she gave him some anxiety medications and that has helped him  settle down. She states they were busy and over at their daughters a lot during Christmas. The  patient denies any acute distress. Empathetic listening and support given. 03-05-2021: The patient states he is doing well and denies any acute changes in his depression or mental health status. He states that he had good results from his cataract surgery. States he is resting well and eating well. Likes the weather we are having currently. Will continue to monitor. 03-16-2021: The patient is concerned because his CPAP machine is not working properly and has not been the last several nights. He and his wife called to illicit the help of the [facility]. The patient states that he does not know what is wrong with it. See OSA plan of care for more information.  Discussed plans with patient for ongoing care management follow up and provided patient with direct contact information for care management team Advised patient to call for changes in mood, anxiety, depression; Provided education to patient re: staying positive, being around positive people, and reporting new changes in depression; Reviewed medications with patient and discussed compliance; Reviewed scheduled/upcoming provider appointments including 07-03-2021 at 2 pm; Discussed plans with patient for ongoing care management follow up and provided patient with direct contact information for care management team; Advised patient to discuss changes in depression with provider; Screening for signs and symptoms of depression related to chronic disease state;  Assessed social determinant of health barriers;  11-27-2020: Call made back to the patient and spoke to the patients wife Arbie Cookey, the patient has a place on his nose that comes up and goes back down. The patient and wife were asking about referral to dermatologist. Collaboration with the pcp and the patient saw Dr. Nehemiah Massed in the summer of 2021 and had places removed. The patient should not need another referral. Information provided to the patient and the patients wife. The number to call and make an  appointment is [phone]. Reviewed with the patient and the patients wife and understanding was verbalized. 03-05-2021: The patient has been to the dermatologist and is doing well. No new changes.    Hyperlipidemia:  (Status: Goal on Track (progressing): YES.) Long Term Goal       Lab Results  Component Value Date    CHOL 130 12/24/2020    HDL 36 (L) 12/24/2020    LDLCALC 71 12/24/2020    TRIG 152 (H) 12/24/2020    CHOLHDL 3.6 12/24/2020      Medication review performed; medication list updated in electronic medical record. 03-05-2021: The patient takes Lipitor 80 mg daily. Provider established cholesterol goals reviewed. 01-08-2021: Review of goals of cholesterol levels Counseled on importance of regular laboratory monitoring as prescribed. 03-05-2021: Review of regular labwork. The patient has on a regular basis Provided HLD educational materials; Reviewed role and benefits of statin for ASCVD risk reduction; Discussed strategies to manage statin-induced myalgias; Reviewed importance of limiting foods high in cholesterol; Reviewed exercise goals and target of 150 minutes per week; Screening for signs and symptoms of depression related to chronic disease state;  Assessed social determinant of health barriers;  Review of pharm D notes and outreach on 11-26-2020 for medication questions the patients wife had with interactions and making sure what the patient was taking was safe. 03-05-2021: The patient and patients wife work with the pharm D on a consistent basis.    Hypertension: (Status: Goal on Track (progressing): YES.) Last practice recorded BP readings:     BP Readings from Last 3 Encounters:  01/19/21 101/81  12/31/20 124/64  10/16/20 129/62  Most recent eGFR/CrCl: No results found for: EGFR  No components found for: CRCL   Evaluation of current treatment plan related to hypertension self management and patient's adherence to plan as established by provider. 01-08-2021: The  patient has good blood pressures. Today during the call his blood pressure was 123/63 with a pulse of 70 and 128/76 with pulse of 75;  He states today was not the best of days but he is feeling better now. 03-16-2021: The patient denies any issues with HTN or heart health. Will continue to monitor for changes Provided education to patient re: stroke prevention, s/s of heart attack and stroke; Reviewed prescribed diet heart healthy Reviewed medications with patient and discussed importance of compliance;  Discussed plans with patient for ongoing care management follow up and provided patient with direct contact information for care management team; Advised patient, providing education and rationale, to monitor blood pressure daily and record, calling PCP for findings outside established parameters;  Advised patient to discuss changes in blood pressure trends with provider; Provided education on prescribed diet heart healthy;  Discussed complications of poorly controlled blood pressure such as heart disease, stroke, circulatory complications, vision complications, kidney impairment, sexual dysfunction;      Falls:  (Status: Goal on Track (progressing): YES.) Long Term Goal  Provided written and verbal education re: potential causes of falls and Fall prevention strategies Reviewed medications and discussed potential side effects of medications such as dizziness and frequent urination Advised patient of importance of notifying provider of falls. 03-05-2021: The patient had a fall today and reported to the [facility] for signs and symptoms of orthostatic hypotension Assessed for falls since last encounter. 11-24-2020: last reported fall at 10-07-2020. 01-08-2021: Denies any new falls. Is being safe. Will continue to monitor. 03-05-2021: Had a fall today in the bedroom. Was getting things from the closet and tripped over the dog steps. The patient denies any injuries or hitting head. The patient states his  wife came and assisted him and he is doing well today. Denies pain or discomfort.  Assessed patients knowledge of fall risk prevention secondary to previously provided education Provided patient information for fall alert systems Assessed working status of life alert bracelet and patient adherence Advised patient to discuss new falls with provider. 03-05-2021: Education provided on fall prevention and safety. Ask the patient to use his cane or his walker when ambulating. The patient states he does not always do this. Education and support given.      OSA with CPAP  (Status: New goal.) Long Term Goal  Evaluation of current treatment plan related to  OSA ,  self-management and patient's adherence to plan as established by provider. Discussed plans with patient for ongoing care management follow up and provided patient with direct contact information for care management team Advised patient to call [facility] and talk to the provider there about his cpap equipment. The patient sees Chipper Herb at [facility] and last saw her in March of last year. The patient and his wife were concerned that they needed a referral. Education provided. The cpap equipment that the patient uses is different from what his wife has. He states it has not been working correctly x 1 week. The patient advised to call the clinic and discuss recommendations for what he needed to do to get the help he needed for his cpap machine. Education and support given; Provided education to patient re: review of steps to take  to get the needed help for trouble- shooting the issue related to problems he is having with his CPAP machine. ; Discussed plans with patient for ongoing care management follow up and provided patient with direct contact information for care management team; Advised patient to discuss new concerns or questions about his OSA and sleep apnea machine with provider;    Patient Goals/Self-Care Activities: Patient will self administer  medications as prescribed as evidenced by self report/primary caregiver report  Patient will attend all scheduled provider appointments as evidenced by clinician review of documented attendance to scheduled appointments and patient/caregiver report Patient will call pharmacy for medication refills as evidenced by patient report and review of pharmacy fill history as appropriate Patient will attend church or other social activities as evidenced by patient report Patient will continue to perform ADL's independently as evidenced by patient/caregiver report Patient will continue to perform IADL's independently as evidenced by patient/caregiver report Patient will call provider office for new concerns or questions as evidenced by review of documented incoming telephone call notes and patient report Patient will work with BSW to address care coordination needs and will continue to work with the clinical team to address health care and disease management related needs as evidenced by documented adherence to scheduled care management/care coordination appointments call office if I gain more than 2 pounds in one day or 5 pounds in one week keep legs up while sitting track weight in diary use salt in moderation watch for swelling in feet, ankles and legs every day weigh myself daily develop a rescue plan follow rescue plan if symptoms flare-up eat more whole grains, fruits and vegetables, lean meats and healthy fats know when to call the doctorfor changes in sx and sx of HF exacerbation  track symptoms and what helps feel better or worse dress right for the weather, hot or cold - check blood pressure weekly - choose a place to take my blood pressure (home, clinic or office, retail store) - write blood pressure results in a log or diary - learn about high blood pressure - keep a blood pressure log - take blood pressure log to all doctor appointments - call doctor for signs and symptoms of high blood  pressure - develop an action plan for high blood pressure - keep all doctor appointments - take medications for blood pressure exactly as prescribed - report new symptoms to your doctor - eat more whole grains, fruits and vegetables, lean meats and healthy fats - call for medicine refill 2 or 3 days before it runs out - take all medications exactly as prescribed - call doctor with any symptoms you believe are related to your medicine - call doctor when you experience any new symptoms - go to all doctor appointments as scheduled - adhere to prescribed diet: heart healthy diet             Our next appointment is by telephone on 04-09-2021 at 145 pm  Please call the care guide team at [phone] if you need to cancel or reschedule your appointment.   If you are experiencing a Mental Health or [place] or need someone to talk to, please call the Suicide and Crisis Lifeline: 988 call the [place] National Suicide Prevention Lifeline: (254) 231-2850 or TTY: 531-050-9430 TTY 7027128144) to talk to a trained counselor call 1-800-273-TALK (toll free, 24 hour hotline)   Patient verbalizes understanding of instructions and care plan provided today and agrees to view in [place]. Active MyChart status confirmed with  patient.    Noreene Larsson RN, MSN, [place] Mobile: [phone]

## 2021-03-25 ENCOUNTER — Other Ambulatory Visit: Payer: Self-pay | Admitting: Family Medicine

## 2021-03-25 DIAGNOSIS — J3089 Other allergic rhinitis: Secondary | ICD-10-CM

## 2021-03-25 NOTE — Telephone Encounter (Signed)
Requested Prescriptions  ?Pending Prescriptions Disp Refills  ?? montelukast (SINGULAIR) 10 MG tablet [Pharmacy Med Name: MONTELUKAST 10MG  TABLETS] 90 tablet 1  ?  Sig: TAKE 1 TABLET(10 MG) BY MOUTH AT BEDTIME  ?  ? Pulmonology:  Leukotriene Inhibitors Passed - 03/25/2021 11:06 AM  ?  ?  Passed - Valid encounter within last 12 months  ?  Recent Outpatient Visits   ?      ? 2 months ago Psychophysiologic insomnia  ? [facility], [place], [place]  ? 5 months ago Anxiety  ? [facility] [place], [place], [place]  ? 11 months ago Benign essential HTN  ? [facility], [name], DO  ? 1 year ago Annual physical exam  ? [facility], DO  ? 1 year ago Anxiety  ? [facility], DO  ?  ?  ?Future Appointments   ?        ? In 3 months Karamalegos, [name], DO [facility], [facility]  ? In 4 months  [facility], [facility]  ?  ? ?  ?  ?  ? ? ?

## 2021-04-06 ENCOUNTER — Ambulatory Visit (INDEPENDENT_AMBULATORY_CARE_PROVIDER_SITE_OTHER): Payer: Medicare PPO

## 2021-04-06 DIAGNOSIS — I63412 Cerebral infarction due to embolism of left middle cerebral artery: Secondary | ICD-10-CM

## 2021-04-07 LAB — CUP PACEART REMOTE DEVICE CHECK
Date Time Interrogation Session: 20230326230938
Implantable Pulse Generator Implant Date: 20200714

## 2021-04-09 ENCOUNTER — Ambulatory Visit: Payer: Self-pay

## 2021-04-09 ENCOUNTER — Telehealth: Payer: Medicare PPO

## 2021-04-09 ENCOUNTER — Telehealth: Payer: Self-pay

## 2021-04-09 DIAGNOSIS — R296 Repeated falls: Secondary | ICD-10-CM

## 2021-04-09 DIAGNOSIS — I5022 Chronic systolic (congestive) heart failure: Secondary | ICD-10-CM

## 2021-04-09 DIAGNOSIS — F3341 Major depressive disorder, recurrent, in partial remission: Secondary | ICD-10-CM

## 2021-04-09 DIAGNOSIS — G4733 Obstructive sleep apnea (adult) (pediatric): Secondary | ICD-10-CM

## 2021-04-09 DIAGNOSIS — I10 Essential (primary) hypertension: Secondary | ICD-10-CM

## 2021-04-09 DIAGNOSIS — E782 Mixed hyperlipidemia: Secondary | ICD-10-CM

## 2021-04-09 NOTE — Telephone Encounter (Signed)
?  Care Management  ? ?Follow Up Note ? ? ?04/09/2021 ?Name: Luke Mckee MRN: [id number] DOB: March 10, 1940 ? ? ?Referred by: [name], DO ?Reason for referral : Chronic Care Management (RNCM: Follow up for Chronic Disease Management and Care Coordination Needs) ? ? ?Call returned back to the patient and was able to complete the call. See new encounter.  ? ?Follow Up Plan: Telephone follow up appointment with care management team member scheduled for: 06-11-2021 at 1 pm ? ?Alto Denver RN, MSN, CCM ?Community Care Coordinator ?[place]  [facility] ?[facility] ?Mobile: [phone]  ?

## 2021-04-09 NOTE — Chronic Care Management (AMB) (Signed)
?Chronic Care Management  ? ?CCM RN Visit Note ? ?04/09/2021 ?Name: Luke Mckee MRN: [id number] DOB: [birth year] ? ?Subjective: ?Luke Mckee is a 81 y.o. year old male who is a primary care patient of Olin Hauser, DO. The care management team was consulted for assistance with disease management and care coordination needs.   ? ?Engaged with patient by telephone for follow up visit in response to provider referral for case management and/or care coordination services.  ? ?Consent to Services:  ?The patient was given information about Chronic Care Management services, agreed to services, and gave verbal consent prior to initiation of services.  Please see initial visit note for detailed documentation.  ? ?Patient agreed to services and verbal consent obtained.  ? ?Assessment: Review of patient past medical history, allergies, medications, health status, including review of consultants reports, laboratory and other test data, was performed as part of comprehensive evaluation and provision of chronic care management services.  ? ?SDOH (Social Determinants of Health) assessments and interventions performed:   ? ?CCM Care Plan ? ?Allergies  ?Allergen Reactions  ? Ace Inhibitors   ? Cephalexin   ?  Vomiting and diarrhea  ? Crestor [Rosuvastatin Calcium] Other (See Comments)  ?  myalgia  ? Tape Rash  ?  blistering  ? ? ?Outpatient Encounter Medications as of 04/09/2021  ?Medication Sig  ? acetaminophen (TYLENOL) 650 MG CR tablet Take 1,300 mg by mouth at bedtime as needed.  ? albuterol (VENTOLIN HFA) 108 (90 Base) MCG/ACT inhaler Inhale 1-2 puffs into the lungs every 4 (four) hours as needed for wheezing or shortness of breath (cough). (Patient not taking: Reported on 01/23/2021)  ? aspirin 81 MG chewable tablet Chew 81 mg by mouth daily.  ? atenolol (TENORMIN) 50 MG tablet Take 1 tablet (50 mg total) by mouth at bedtime.  ? atorvastatin (LIPITOR) 80 MG tablet Take 1 tablet (80 mg total) by mouth daily.  ?  Cholecalciferol (VITAMIN D) 50 MCG (2000 UT) tablet Take 4,000 Units by mouth daily.  ? clopidogrel (PLAVIX) 75 MG tablet TAKE 1 TABLET(75 MG) BY MOUTH DAILY  ? clotrimazole-betamethasone (LOTRISONE) cream Apply twice a day for 1-2 weeks, may repeat if need in future  ? co-enzyme Q-10 30 MG capsule Take 100 mg by mouth daily.  ? diclofenac sodium (VOLTAREN) 1 % GEL Apply 2 g topically 4 (four) times daily as needed for pain. Foot pain  ? docusate sodium (COLACE) 100 MG capsule Take 200 mg by mouth daily.  ? furosemide (LASIX) 80 MG tablet Take 1 tablet (80 mg total) by mouth 2 (two) times daily.  ? isosorbide mononitrate (IMDUR) 30 MG 24 hr tablet Take 1 tablet (30 mg total) by mouth daily.  ? levothyroxine (SYNTHROID) 125 MCG tablet Take 1 tablet (125 mcg total) by mouth daily before breakfast.  ? loratadine (CLARITIN) 10 MG tablet Take 10 mg by mouth daily.  ? LORazepam (ATIVAN) 0.5 MG tablet Take 1 tablet (0.5 mg total) by mouth daily as needed for anxiety or sleep.  ? montelukast (SINGULAIR) 10 MG tablet TAKE 1 TABLET(10 MG) BY MOUTH AT BEDTIME  ? Multiple Vitamin (MULTIVITAMIN) tablet Take 1 tablet by mouth daily.  ? nitroGLYCERIN (NITROSTAT) 0.4 MG SL tablet Place 1 tablet (0.4 mg total) under the tongue every 5 (five) minutes as needed for chest pain.  ? PATADAY 0.1 % ophthalmic solution Place into both eyes as needed.   ? senna (SENOKOT) 8.6 MG tablet Take 1 tablet by mouth daily.  ?  sertraline (ZOLOFT) 100 MG tablet Take 2 tablets (200 mg total) by mouth daily.  ? spironolactone (ALDACTONE) 25 MG tablet Take 0.5 tablets (12.5 mg total) by mouth daily.  ? traZODone (DESYREL) 50 MG tablet Take 1 tablet (50 mg total) by mouth at bedtime.  ? ?No facility-administered encounter medications on file as of 04/09/2021.  ? ? ?Patient Active Problem List  ? Diagnosis Date Noted  ? Hallux limitus, acquired, right 11/12/2019  ? Pre-diabetes 10/17/2019  ? Recurrent major depressive disorder, in partial remission ([place])  04/16/2019  ? Morbid obesity ([place]) 04/16/2019  ? Vasomotor rhinitis 10/25/2018  ? Hearing loss 08/28/2018  ? Osteoarthritis 08/28/2018  ? Tinnitus 08/28/2018  ? 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 ([place]) 07/31/2018  ? TIA (transient ischemic attack) 07/23/2018  ? Slurred speech 07/23/2018  ? Right arm weakness 07/23/2018  ? Hypokalemia 07/23/2018  ? Anxiety 07/20/2018  ? Atherosclerosis of native coronary artery of native heart with stable angina pectoris ([place]) 06/16/2018  ? Hx of CABG 06/16/2018  ? Mixed hyperlipidemia 06/16/2018  ? Benign essential HTN 06/16/2018  ? History of cerebrovascular accident (CVA) with residual deficit 06/07/2018  ? Systolic CHF ([place]) [id number]  ? Hypertension 02/15/2018  ? S/P total knee arthroplasty, right 02/15/2018  ? Primary osteoarthritis of both knees 11/25/2017  ? Abnormal nuclear stress test 06/30/2016  ? Hallux limitus of right foot 10/22/2015  ? Anesthesia complication [id number]  ? GERD (gastroesophageal reflux disease) 10/10/2015  ? Hypothyroidism 10/10/2015  ? OSA on CPAP 01/10/2007  ? Diagnosis unknown 01/12/2003  ? ? ?Conditions to be addressed/monitored:CHF, HTN, HLD, Depression, and falls ? ?Care Plan : RNCM: General Plan of Care (Adult) for Chronic Disease Management and Care Coordination Needs  ?Updates made by Vanita Ingles, RN since 04/09/2021 12:00 AM  ?  ? ?Problem: RNCM: Development of Plan of Care For Chronic Disease Management (HF, HTN, HLD, Depression, Falls)   ?Priority: High  ?  ? ?Long-Range Goal: RNCM: Effective Management of Plan of Care For Chronic Disease Management (HF, HTN, HLD, Depression, Falls)   ?Start Date: 11/24/2020  ?Expected End Date: 11/24/2021  ?Priority: High  ?Note:   ?Current Barriers:  ?Knowledge Deficits related to plan of care for management of CHF, Depression: depressed mood, and falls prevention and safety   ?Chronic Disease Management support and education needs related to CHF, HTN, HLD, Depression:  depressed mood, and falls and safety ? ?RNCM Clinical Goal(s):  ?Patient will verbalize understanding of plan for management of CHF, HTN, HLD, Depression, and falls and safety as evidenced by following the plan of care, compliance with medications, compliance with dietary restrictions  ?take all medications exactly as prescribed and will call provider for medication related questions as evidenced by compliance with medications and calling for needed refills before running out    ?attend all scheduled medical appointments: 07-03-2021 at 200 pm as evidenced by keeping appointments and calling if appointment needs to be rescheduled        ?demonstrate improved and ongoing adherence to prescribed treatment plan for CHF, HTN, HLD, Depression, and Falls and safety preventions as evidenced by working with the CCM team and pcp to effectively manage chronic conditions  ?demonstrate a decrease in CHF, HTN, HLD, Depression, and falls and safety exacerbations  as evidenced by stable conditions, calling for changes, and working with the CCM team to optimize health and well being ?demonstrate ongoing self health care management ability effective management of chronic conditions as evidenced by  working with the CCM team through collaboration with Consulting civil engineer, provider, and care team.  ? ?Interventions: ?1:1 collaboration with primary care provider regarding development and update of comprehensive plan of care as evidenced by provider attestation and co-signature ?Inter-disciplinary care team collaboration (see longitudinal plan of care) ?Evaluation of current treatment plan related to  self management and patient's adherence to plan as established by provider ? ? ?SDOH Barriers (Status: Goal on Track (progressing): YES.) Long Term Goal  ?Patient interviewed and SDOH assessment performed ?       ?Patient interviewed and appropriate assessments performed ?Provided patient with information about resources available and the care  guides being available to assist with changes or new needs ?Discussed plans with patient for ongoing care management follow up and provided patient with direct contact information for care management team ?Advised p

## 2021-04-09 NOTE — Patient Instructions (Signed)
Visit Information ? ?Thank you for taking time to visit with me today. Please don't hesitate to contact me if I can be of assistance to you before our next scheduled telephone appointment. ? ?Following are the goals we discussed today:  ?RNCM Clinical Goal(s):  ?Patient will verbalize understanding of plan for management of CHF, HTN, HLD, Depression, and falls and safety as evidenced by following the plan of care, compliance with medications, compliance with dietary restrictions  ?take all medications exactly as prescribed and will call provider for medication related questions as evidenced by compliance with medications and calling for needed refills before running out    ?attend all scheduled medical appointments: 07-03-2021 at 200 pm as evidenced by keeping appointments and calling if appointment needs to be rescheduled        ?demonstrate improved and ongoing adherence to prescribed treatment plan for CHF, HTN, HLD, Depression, and Falls and safety preventions as evidenced by working with the CCM team and pcp to effectively manage chronic conditions  ?demonstrate a decrease in CHF, HTN, HLD, Depression, and falls and safety exacerbations  as evidenced by stable conditions, calling for changes, and working with the CCM team to optimize health and well being ?demonstrate ongoing self health care management ability effective management of chronic conditions as evidenced by  working with the CCM team through collaboration with Consulting civil engineer, provider, and care team.  ?  ?Interventions: ?1:1 collaboration with primary care provider regarding development and update of comprehensive plan of care as evidenced by provider attestation and co-signature ?Inter-disciplinary care team collaboration (see longitudinal plan of care) ?Evaluation of current treatment plan related to  self management and patient's adherence to plan as established by provider ?  ?  ?SDOH Barriers (Status: Goal on Track (progressing): YES.) Long  Term Goal  ?Patient interviewed and SDOH assessment performed ?       ?Patient interviewed and appropriate assessments performed ?Provided patient with information about resources available and the care guides being available to assist with changes or new needs ?Discussed plans with patient for ongoing care management follow up and provided patient with direct contact information for care management team ?Advised patient to call for changes in SDOH, questions and concerns ?  ?  ?  ?Heart Failure Interventions:  (Status: Goal on Track (progressing): YES.)  Long Term Goal  ?Basic overview and discussion of pathophysiology of Heart Failure reviewed ?Provided education on low sodium diet. 04-09-2021: Is compliant with a heart healthy diet  ?Reviewed Heart Failure Action Plan in depth and provided written copy ?Assessed need for readable accurate scales in home ?Provided education about placing scale on hard, flat surface ?Advised patient to weigh each morning after emptying bladder ?Discussed importance of daily weight and advised patient to weigh and record daily ?Reviewed role of diuretics in prevention of fluid overload and management of heart failure ?Discussed the importance of keeping all appointments with provider ?Provided patient with education about the role of exercise in the management of heart failure ?Advised patient to discuss new changes, edema more than usual with provider ?  ?Depression   (Status: Goal on Track (progressing): YES.) Long Term Goal  ?Evaluation of current treatment plan related to Depression, Mental Health Concerns  self-management and patient's adherence to plan as established by provider. 01-08-2021: The patient states he feels "Out of it today". The patients wife states he seemed to be uptight earlier and not feeling well so she gave him some anxiety medications and that has helped him  settle down. She states they were busy and over at their daughters a lot during Christmas. The  patient denies any acute distress. Empathetic listening and support given. 03-05-2021: The patient states he is doing well and denies any acute changes in his depression or mental health status. He states that he had good results from his cataract surgery. States he is resting well and eating well. Likes the weather we are having currently. Will continue to monitor. 03-16-2021: The patient is concerned because his CPAP machine is not working properly and has not been the last several nights. He and his wife called to illicit the help of the [facility]. The patient states that he does not know what is wrong with it. See OSA plan of care for more information. 04-09-2021: The patient is doing well and excited about his birthday coming up next week. The patient states he never thought he would live to see 81 years old. His family has plans to take him out and celebrate. He said last week he had a not so good week but he is having a better week this week. Denies any acute findings.  ?Discussed plans with patient for ongoing care management follow up and provided patient with direct contact information for care management team ?Advised patient to call for changes in mood, anxiety, depression; ?Provided education to patient re: staying positive, being around positive people, and reporting new changes in depression; ?Reviewed medications with patient and discussed compliance. 04-09-2021: Is compliant with his medications. ; ?Reviewed scheduled/upcoming provider appointments including 07-03-2021 at 2 pm; ?Discussed plans with patient for ongoing care management follow up and provided patient with direct contact information for care management team; ?Advised patient to discuss changes in depression with provider; ?Screening for signs and symptoms of depression related to chronic disease state;  ?Assessed social determinant of health barriers;  ?11-27-2020: Call made back to the patient and spoke to the patients wife Arbie Cookey, the patient  has a place on his nose that comes up and goes back down. The patient and wife were asking about referral to dermatologist. Collaboration with the pcp and the patient saw Dr. Nehemiah Massed in the summer of 2021 and had places removed. The patient should not need another referral. Information provided to the patient and the patients wife. The number to call and make an appointment is [phone]. Reviewed with the patient and the patients wife and understanding was verbalized. 03-05-2021: The patient has been to the dermatologist and is doing well. No new changes.  ?  ?Hyperlipidemia:  (Status: Goal on Track (progressing): YES.) Long Term Goal  ?     ?Lab Results  ?Component Value Date  ?  CHOL 130 12/24/2020  ?  HDL 36 (L) 12/24/2020  ?  [place] 71 12/24/2020  ?  TRIG 152 (H) 12/24/2020  ?  CHOLHDL 3.6 12/24/2020  ?  ?  ?Medication review performed; medication list updated in electronic medical record. 04-09-2021: The patient takes Lipitor 80 mg daily. ?Provider established cholesterol goals reviewed. 04-09-2020: Review of goals of cholesterol levels ?Counseled on importance of regular laboratory monitoring as prescribed. 04-09-2021: Review of regular labwork. The patient has on a regular basis ?Provided HLD educational materials; ?Reviewed role and benefits of statin for ASCVD risk reduction; ?Discussed strategies to manage statin-induced myalgias; ?Reviewed importance of limiting foods high in cholesterol; ?Reviewed exercise goals and target of 150 minutes per week; ?Screening for signs and symptoms of depression related to chronic disease state;  ?Assessed social determinant of health barriers;  ?  Review of pharm D notes and outreach on 11-26-2020 for medication questions the patients wife had with interactions and making sure what the patient was taking was safe. 04-09-2021: The patient and patients wife work with the pharm D on a consistent basis.  ?  ?Hypertension: (Status: Goal on Track (progressing): YES.) ?Last  practice recorded BP readings:  ?   ?BP Readings from Last 3 Encounters:  ?01/19/21 101/81  ?12/31/20 124/64  ?10/16/20 129/62  ?Most recent eGFR/CrCl: No results found for: EGFR  No components found for: CRCL ?  ?Ev

## 2021-04-10 DIAGNOSIS — I5022 Chronic systolic (congestive) heart failure: Secondary | ICD-10-CM

## 2021-04-10 DIAGNOSIS — F3341 Major depressive disorder, recurrent, in partial remission: Secondary | ICD-10-CM

## 2021-04-10 DIAGNOSIS — E782 Mixed hyperlipidemia: Secondary | ICD-10-CM | POA: Diagnosis not present

## 2021-04-10 DIAGNOSIS — I10 Essential (primary) hypertension: Secondary | ICD-10-CM

## 2021-04-14 ENCOUNTER — Emergency Department
Admission: EM | Admit: 2021-04-14 | Discharge: 2021-04-14 | Disposition: A | Discharge status: Home / Self Care | Payer: Medicare PPO | Attending: Emergency Medicine | Admitting: Emergency Medicine

## 2021-04-14 ENCOUNTER — Emergency Department: Payer: Medicare PPO

## 2021-04-14 ENCOUNTER — Other Ambulatory Visit: Payer: Self-pay

## 2021-04-14 DIAGNOSIS — W108XXA Fall (on) (from) other stairs and steps, initial encounter: Secondary | ICD-10-CM | POA: Diagnosis not present

## 2021-04-14 DIAGNOSIS — I110 Hypertensive heart disease with heart failure: Secondary | ICD-10-CM | POA: Diagnosis not present

## 2021-04-14 DIAGNOSIS — Z7902 Long term (current) use of antithrombotics/antiplatelets: Secondary | ICD-10-CM | POA: Insufficient documentation

## 2021-04-14 DIAGNOSIS — I509 Heart failure, unspecified: Secondary | ICD-10-CM | POA: Diagnosis not present

## 2021-04-14 DIAGNOSIS — S01511A Laceration without foreign body of lip, initial encounter: Secondary | ICD-10-CM

## 2021-04-14 DIAGNOSIS — S199XXA Unspecified injury of neck, initial encounter: Secondary | ICD-10-CM | POA: Diagnosis not present

## 2021-04-14 DIAGNOSIS — S0990XA Unspecified injury of head, initial encounter: Secondary | ICD-10-CM | POA: Diagnosis not present

## 2021-04-14 DIAGNOSIS — S0993XA Unspecified injury of face, initial encounter: Secondary | ICD-10-CM | POA: Diagnosis not present

## 2021-04-14 DIAGNOSIS — W19XXXA Unspecified fall, initial encounter: Secondary | ICD-10-CM

## 2021-04-14 DIAGNOSIS — S0083XA Contusion of other part of head, initial encounter: Secondary | ICD-10-CM

## 2021-04-14 LAB — BASIC METABOLIC PANEL
Anion gap: 10 (ref 5–15)
BUN: 15 mg/dL (ref 8–23)
CO2: 26 mmol/L (ref 22–32)
Calcium: 9.1 mg/dL (ref 8.9–10.3)
Chloride: 103 mmol/L (ref 98–111)
Creatinine, Ser: 0.91 mg/dL (ref 0.61–1.24)
GFR, Estimated: >60 mL/min (ref >60)
Glucose, Bld: 120 mg/dL — ABNORMAL HIGH (ref 70–99)
Potassium: 3.7 mmol/L (ref 3.5–5.1)
Sodium: 139 mmol/L (ref 135–145)

## 2021-04-14 LAB — CBC
HCT: 39.9 % (ref 39.0–52.0)
Hemoglobin: 13.1 g/dL (ref 13.0–17.0)
MCH: 30.7 pg (ref 26.0–34.0)
MCHC: 32.8 g/dL (ref 30.0–36.0)
MCV: 93.4 fL (ref 80.0–100.0)
Platelets: 219 10*3/uL (ref 150–400)
RBC: 4.27 MIL/uL (ref 4.22–5.81)
RDW: 13.9 % (ref 11.5–15.5)
WBC: 7.1 10*3/uL (ref 4.0–10.5)
nRBC: 0 % (ref 0.0–0.2)

## 2021-04-14 NOTE — ED Provider Notes (Signed)
? ?[facility] ?Provider Note ? ? ? Event Date/Time  ? First MD Initiated Contact with Patient 04/14/21 1435   ?  (approximate) ? ? ?History  ? ?Chief Complaint ?Fall ? ? ?HPI ? ?Luke Mckee is a 81 y.o. male with past medical history of hypertension, hyperlipidemia, stroke, and CHF who presents to the ED complaining of fall.  Patient reports that he was going down 2 steps about an hour prior to arrival when he lost his balance and fell forward, striking his face.  He does not think he lost consciousness, does report taking Plavix and a baby aspirin daily for blood thinners.  He complains of pain along the bridge of his nose as well as along his upper lip, where he has a small laceration.  He denies any pain in his chest, abdomen, or extremities.  He has been ambulatory since the fall without difficulty. ?  ? ? ?Physical Exam  ? ?Triage Vital Signs: ?ED Triage Vitals  ?Enc Vitals Group  ?   BP 04/14/21 1149 (!) 114/54  ?   Pulse Rate 04/14/21 1149 67  ?   Resp 04/14/21 1149 20  ?   Temp 04/14/21 1149 98.5 ?F (36.9 ?C)  ?   Temp Source 04/14/21 1149 Oral  ?   SpO2 04/14/21 1149 94 %  ?   Weight 04/14/21 1150 244 lb 0.8 oz (110.7 kg)  ?   Height 04/14/21 1150 5\' 8"  (1.727 m)  ?   Head Circumference --   ?   Peak Flow --   ?   Pain Score 04/14/21 1150 1  ?   Pain Loc --   ?   Pain Edu? --   ?   Excl. in GC? --   ? ? ?Most recent vital signs: ?Vitals:  ? 04/14/21 1430 04/14/21 1500  ?BP: 110/77 125/75  ?Pulse: 60 62  ?Resp: 20 16  ?Temp:    ?SpO2: 95% 96%  ? ? ?Constitutional: Alert and oriented. ?Eyes: Conjunctivae are normal. ?Head: Tenderness to palpation at the bridge of the nose with no obvious deformity.  No scalp hematomas or step-offs noted. ?Nose: No congestion/rhinnorhea. ?Mouth/Throat: Mucous membranes are moist.  Superficial laceration to middle of the upper lip.  Superficial laceration also noted to the anterior portion of upper lip. ?Neck: No midline cervical spine tenderness to  palpation. ?Cardiovascular: Normal rate, regular rhythm. Grossly normal heart sounds.  2+ radial pulses bilaterally. ?Respiratory: Normal respiratory effort.  No retractions. Lungs CTAB.  No chest wall tenderness to palpation. ?Gastrointestinal: Soft and nontender. No distention. ?Musculoskeletal: No lower extremity tenderness nor edema.  No midline thoracic or lumbar spinal tenderness to palpation. ?Neurologic:  Normal speech and language. No gross focal neurologic deficits are appreciated. ? ? ? ?ED Results / Procedures / Treatments  ? ?Labs ?(all labs ordered are listed, but only abnormal results are displayed) ?Labs Reviewed  ?BASIC METABOLIC PANEL - Abnormal; Notable for the following components:  ?    Result Value  ? Glucose, Bld 120 (*)   ? All other components within normal limits  ?CBC  ? ? ?RADIOLOGY ?CT head reviewed by me with no intracranial hemorrhage or midline shift.  CT maxillofacial and cervical spine reviewed by me with no fracture or dislocation. ? ?PROCEDURES: ? ?Critical Care performed: No ? ?Procedures ? ? ?MEDICATIONS ORDERED IN ED: ?Medications - No data to display ? ? ?IMPRESSION / MDM / ASSESSMENT AND PLAN / ED COURSE  ?I reviewed the  triage vital signs and the nursing notes. ?             ?               ? ?81 y.o. male with past medical history of hypertension, hyperlipidemia, stroke, and CHF who presents to the ED complaining of trip and fall while walking down some steps just prior to arrival. ? ?Differential diagnosis includes, but is not limited to, intracranial injury, facial fracture, facial laceration, cervical spine injury. ? ?Patient is nontoxic-appearing and in no acute distress, vital signs are unremarkable and he has no focal neurologic deficits on exam.  [name] wife reports that he is up-to-date on tetanus and received it in the past 5 years.  Lip laceration is superficial, was cleaned with normal saline and repaired with Dermabond.  CT imaging of head, neck, and face is  unremarkable.  Labs are unremarkable with CBC showing no anemia or leukocytosis, BMP without electrolyte abnormality or AKI.  Patient is appropriate for discharge home with PCP follow-up, was counseled to return to the ED for new worsening symptoms, patient agrees with plan. ? ?  ? ? ?FINAL CLINICAL IMPRESSION(S) / ED DIAGNOSES  ? ?Final diagnoses:  ?Fall, initial encounter  ?Contusion of face, initial encounter  ?Lip laceration, initial encounter  ? ? ? ?Rx / DC Orders  ? ?ED Discharge Orders   ? ? None  ? ?  ? ? ? ?Note:  This document was prepared using Dragon voice recognition software and may include unintentional dictation errors. ?  ?Chesley Noon, MD ?04/14/21 1536 ? ?

## 2021-04-14 NOTE — ED Triage Notes (Signed)
Pt here with a fall. Pt missed a step and fell and hit his face. Pt is currently bleeding from his top lip. Pt is on plavix. Pt stable in triage. ?

## 2021-04-16 NOTE — Progress Notes (Signed)
Carelink Summary Report / Loop Recorder 

## 2021-04-22 ENCOUNTER — Ambulatory Visit (INDEPENDENT_AMBULATORY_CARE_PROVIDER_SITE_OTHER): Payer: Medicare PPO | Admitting: Pharmacist

## 2021-04-22 DIAGNOSIS — I10 Essential (primary) hypertension: Secondary | ICD-10-CM

## 2021-04-22 NOTE — Chronic Care Management (AMB) (Signed)
? ?Chronic Care Management ?CCM Pharmacy Note ? ?04/22/2021 ?Name:  Luke Mckee MRN:  [id number] DOB:  July 08, 1940 ? ? ?Subjective: ?Luke Mckee is an 81 y.o. year old male who is a primary patient of [name], DO.  The CCM team was consulted for assistance with disease management and care coordination needs.   ? ?Engaged with patient by telephone for follow up visit for pharmacy case management and/or care coordination services.  ? ?Objective: ? ?Medications Reviewed Today   ? ? Reviewed by Manuela Neptune, RPH-CPP (Pharmacist) on 04/22/21 at 1605  Med List Status: <None>  ? ?Medication Order Taking? Sig Documenting Provider Last Dose Status Informant  ?acetaminophen (TYLENOL) 650 MG CR tablet [id number]  Take 1,300 mg by mouth at bedtime as needed. [provider]  Active Spouse/Significant Other  ?albuterol (VENTOLIN HFA) 108 (90 Base) MCG/ACT inhaler [id number]  Inhale 1-2 puffs into the lungs every 4 (four) hours as needed for wheezing or shortness of breath (cough).  ?Patient not taking: Reported on 01/23/2021  ? [name], DO  Active   ?aspirin 81 MG chewable tablet [id number]  Chew 81 mg by mouth daily. [provider]  Active   ?atenolol (TENORMIN) 50 MG tablet [id number] Yes Take 1 tablet (50 mg total) by mouth at bedtime. [name], DO Taking Active   ?atorvastatin (LIPITOR) 80 MG tablet [id number]  Take 1 tablet (80 mg total) by mouth daily. [name], DO  Active   ?Cholecalciferol (VITAMIN D) 50 MCG (2000 UT) tablet [id number]  Take 4,000 Units by mouth daily. [provider]  Active Spouse/Significant Other  ?clopidogrel (PLAVIX) 75 MG tablet [id number]  TAKE 1 TABLET(75 MG) BY MOUTH DAILY Gollan, Tollie Pizza, MD  Active   ?clotrimazole-betamethasone (LOTRISONE) cream [id number]  Apply twice a day for 1-2 weeks, may repeat if need in future [name], DO  Active   ?co-enzyme Q-10 30 MG capsule [id number]  Take  100 mg by mouth daily. [provider]  Active   ?diclofenac sodium (VOLTAREN) 1 % GEL [id number]  Apply 2 g topically 4 (four) times daily as needed for pain. Foot pain [provider]  Active Spouse/Significant Other  ?docusate sodium (COLACE) 100 MG capsule [id number]  Take 200 mg by mouth daily. [provider]  Active Spouse/Significant Other  ?furosemide (LASIX) 80 MG tablet [id number] Yes Take 1 tablet (80 mg total) by mouth 2 (two) times daily. [name], DO Taking Active   ?isosorbide mononitrate (IMDUR) 30 MG 24 hr tablet [id number] Yes Take 1 tablet (30 mg total) by mouth daily. [name], DO Taking Active   ?levothyroxine (SYNTHROID) 125 MCG tablet [id number]  Take 1 tablet (125 mcg total) by mouth daily before breakfast. [name], DO  Active   ?loratadine (CLARITIN) 10 MG tablet [id number] Yes Take 10 mg by mouth daily. [provider] Taking Active   ?LORazepam (ATIVAN) 0.5 MG tablet [id number]  Take 1 tablet (0.5 mg total) by mouth daily as needed for anxiety or sleep. [name], DO  Active   ?montelukast (SINGULAIR) 10 MG tablet [id number] Yes TAKE 1 TABLET(10 MG) BY MOUTH AT BEDTIME [name], DO Taking Active   ?Multiple Vitamin (MULTIVITAMIN) tablet [id number]  Take 1 tablet by mouth daily. [provider]  Active Spouse/Significant Other  ?nitroGLYCERIN (NITROSTAT) 0.4 MG SL tablet [id number]  Place 1 tablet (0.4 mg total) under the tongue every 5 (five) minutes as needed for chest pain.  Smitty CordsKaramalegos, Alexander J, DO  Active   ?PATADAY 0.1 % ophthalmic solution [id number]  Place into both eyes as needed.  [provider]  Active   ?senna (SENOKOT) 8.6 MG tablet [id number]  Take 1 tablet by mouth daily. [provider]  Active   ?sertraline (ZOLOFT) 100 MG tablet [id number]  Take 2 tablets (200 mg total) by mouth daily. Smitty CordsKaramalegos, Alexander J, DO  Active   ?spironolactone  (ALDACTONE) 25 MG tablet [id number] Yes Take 0.5 tablets (12.5 mg total) by mouth daily. Smitty CordsKaramalegos, Alexander J, DO Taking Active   ?traZODone (DESYREL) 50 MG tablet [id number]  Take 1 tablet (50 mg total) by mouth at bedtime. Smitty CordsKaramalegos, Alexander J, DO  Active   ? ?  ?  ? ?  ? ? ?Pertinent Labs:   ? ?Lab Results  ?Component Value Date  ? CHOL 130 12/24/2020  ? HDL 36 (L) 12/24/2020  ? LDLCALC 71 12/24/2020  ? TRIG 152 (H) 12/24/2020  ? CHOLHDL 3.6 12/24/2020  ? ?Lab Results  ?Component Value Date  ? CREATININE 0.91 04/14/2021  ? BUN 15 04/14/2021  ? NA 139 04/14/2021  ? K 3.7 04/14/2021  ? CL 103 04/14/2021  ? CO2 26 04/14/2021  ? ?BP Readings from Last 3 Encounters:  ?04/14/21 125/75  ?01/19/21 101/81  ?12/31/20 124/64  ? ?Pulse Readings from Last 3 Encounters:  ?04/14/21 62  ?01/19/21 63  ?12/31/20 66  ? ? ? ?SDOH:  (Social Determinants of Health) assessments and interventions performed:  ? ? ?CCM Care Plan ? ?Review of patient past medical history, allergies, medications, health status, including review of consultants reports, laboratory and other test data, was performed as part of comprehensive evaluation and provision of chronic care management services.  ? ?Care Plan : PharmD - Med Management  ?Updates made by Manuela Neptuneelles, Masao Junker A, RPH-CPP since 04/22/2021 12:00 AM  ?  ? ?Problem: Disease Progression   ?  ? ?Long-Range Goal: Disease Progression Prevented or Minimized   ?Start Date: 02/04/2020  ?Expected End Date: 05/04/2020  ?Recent Progress: On track  ?Priority: High  ?Note:   ?Current Barriers:  ?Chronic Disease Management support, education, and care coordination needs related to CAD, HTN, HLD, and hx CVA ? ?Pharmacist Clinical Goal(s):  ?Over the next 90 days, patient will adhere to plan to optimize therapeutic regimen for hypothyroidism as evidenced by report of adherence to recommended medication management changes through collaboration with PharmD and provider.  ? ? ?Interventions: ?1:1 collaboration  with Smitty CordsKaramalegos, Alexander J, DO regarding development and update of comprehensive plan of care as evidenced by provider attestation and co-signature ?Inter-disciplinary care team collaboration (see longitudinal plan of care) ?Perform chart review.  ?Patient seen in [facility] on 4/4 related to a fall ?Today patient reports his face is healing well since the fall.  ?Denies feeling dizzy or lightheaded prior to his fall, rather states that he missed a step ?Spouse reports pharmacy working on synchronizing patient's prescription refills ? ?Hypertension ?Controlled; current treatment: ?Atenolol 50 mg daily at bedtime ?Furosemide 80 mg twice daily ?Isosorbide ER 30 mg once daily ?Spironolactone 25 mg - 1/2 tablet (12.5 mg) once daily ?Last checked at home: ?4/4: 133/65, HR 63 ?Report have checked since and readings "good", but did not record ?Denies symptoms of hypotension ?Encourage to monitor home BP 1-2 times/week, keep log of results and call providers for readings outside of established parameters or symptoms ? ?Seasonal allergies: ?Reports allergies currently controlled ?Current treatment: ?Loratadine 10 mg daily ?Montelukast 10  mg QHS ? ? ?Patient Goals/Self-Care Activities ?Over the next 90 days, patient will:  ?- take medications as prescribed with assistance of wife  ? Note: patient uses weekly pillbox as filled by wife ?- check blood pressure, document, and provide at future appointments ? ?Follow Up Plan: Telephone follow up appointment with care management team member scheduled for: 08/05/2021 at 1:00 PM ? ?  ?  ? ?Estelle Grumbles, PharmD, BCACP, CPP ?Clinical Pharmacist ?[facility] ?[place] ?[phone] ? ? ? ? ? ?

## 2021-04-22 NOTE — Patient Instructions (Signed)
Visit Information ? ?Thank you for taking time to visit with me today. Please don't hesitate to contact me if I can be of assistance to you before our next scheduled telephone appointment. ? ?Following are the goals we discussed today:  ? Goals Addressed   ? ?  ?  ?  ?  ? This Visit's Progress  ?  Pharmacy Goals     ?  It was great talking with you today! ? ?Please check your blood pressure 1-2 times/week at home and keep a record to bring to medical appointments ? ?Our goal bad cholesterol, or LDL, is less than 70 . This is why it is important to continue taking your atorvastatin ? ?Feel free to call me with any questions or concerns. I look forward to our next call! ? ? ?Estelle Grumbles, PharmD, BCACP ?Clinical Pharmacist ?[facility] ?[place] ?[phone] ? ? ?  ? ?  ? ? ? ?Our next appointment is by telephone on 08/05/2021 at 1:00 PM ? ?Please call the care guide team at [phone] if you need to cancel or reschedule your appointment.  ? ? ?Patient verbalizes understanding of instructions and care plan provided today and agrees to view in MyChart. Active MyChart status confirmed with patient.   ? ?

## 2021-05-10 DIAGNOSIS — I10 Essential (primary) hypertension: Secondary | ICD-10-CM

## 2021-05-10 LAB — CUP PACEART REMOTE DEVICE CHECK
Date Time Interrogation Session: 20230428230823
Implantable Pulse Generator Implant Date: 20200714

## 2021-05-11 ENCOUNTER — Ambulatory Visit (INDEPENDENT_AMBULATORY_CARE_PROVIDER_SITE_OTHER): Payer: Medicare PPO

## 2021-05-11 DIAGNOSIS — I63412 Cerebral infarction due to embolism of left middle cerebral artery: Secondary | ICD-10-CM

## 2021-05-26 NOTE — Progress Notes (Signed)
Carelink Summary Report / Loop Recorder 

## 2021-06-11 ENCOUNTER — Telehealth: Payer: Medicare PPO

## 2021-06-11 ENCOUNTER — Other Ambulatory Visit: Payer: Self-pay | Admitting: Family Medicine

## 2021-06-11 ENCOUNTER — Ambulatory Visit (INDEPENDENT_AMBULATORY_CARE_PROVIDER_SITE_OTHER): Payer: Medicare PPO

## 2021-06-11 DIAGNOSIS — E782 Mixed hyperlipidemia: Secondary | ICD-10-CM

## 2021-06-11 DIAGNOSIS — E039 Hypothyroidism, unspecified: Secondary | ICD-10-CM

## 2021-06-11 DIAGNOSIS — R296 Repeated falls: Secondary | ICD-10-CM

## 2021-06-11 DIAGNOSIS — I10 Essential (primary) hypertension: Secondary | ICD-10-CM

## 2021-06-11 DIAGNOSIS — I25810 Atherosclerosis of coronary artery bypass graft(s) without angina pectoris: Secondary | ICD-10-CM

## 2021-06-11 DIAGNOSIS — I5022 Chronic systolic (congestive) heart failure: Secondary | ICD-10-CM

## 2021-06-11 DIAGNOSIS — F3341 Major depressive disorder, recurrent, in partial remission: Secondary | ICD-10-CM

## 2021-06-11 NOTE — Chronic Care Management (AMB) (Signed)
Chronic Care Management   CCM RN Visit Note  06/11/2021 Name: Luke Mckee MRN: [id number] DOB: 1940-03-24  Subjective: Luke Mckee is a 81 y.o. year old male who is a primary care patient of Olin Hauser, DO. The care management team was consulted for assistance with disease management and care coordination needs.    Engaged with patient by telephone for follow up visit in response to provider referral for case management and/or care coordination services.   Consent to Services:  The patient was given information about Chronic Care Management services, agreed to services, and gave verbal consent prior to initiation of services.  Please see initial visit note for detailed documentation.   Patient agreed to services and verbal consent obtained.   Assessment: Review of patient past medical history, allergies, medications, health status, including review of consultants reports, laboratory and other test data, was performed as part of comprehensive evaluation and provision of chronic care management services.   SDOH (Social Determinants of Health) assessments and interventions performed:    CCM Care Plan  Allergies  Allergen Reactions   Ace Inhibitors    Cephalexin     Vomiting and diarrhea   Crestor [Rosuvastatin Calcium] Other (See Comments)    myalgia   Tape Rash    blistering    Outpatient Encounter Medications as of 06/11/2021  Medication Sig   acetaminophen (TYLENOL) 650 MG CR tablet Take 1,300 mg by mouth at bedtime as needed.   albuterol (VENTOLIN HFA) 108 (90 Base) MCG/ACT inhaler Inhale 1-2 puffs into the lungs every 4 (four) hours as needed for wheezing or shortness of breath (cough). (Patient not taking: Reported on 01/23/2021)   aspirin 81 MG chewable tablet Chew 81 mg by mouth daily.   atenolol (TENORMIN) 50 MG tablet Take 1 tablet (50 mg total) by mouth at bedtime.   atorvastatin (LIPITOR) 80 MG tablet Take 1 tablet (80 mg total) by mouth daily.    Cholecalciferol (VITAMIN D) 50 MCG (2000 UT) tablet Take 4,000 Units by mouth daily.   clopidogrel (PLAVIX) 75 MG tablet TAKE 1 TABLET(75 MG) BY MOUTH DAILY   clotrimazole-betamethasone (LOTRISONE) cream Apply twice a day for 1-2 weeks, may repeat if need in future   co-enzyme Q-10 30 MG capsule Take 100 mg by mouth daily.   diclofenac sodium (VOLTAREN) 1 % GEL Apply 2 g topically 4 (four) times daily as needed for pain. Foot pain   docusate sodium (COLACE) 100 MG capsule Take 200 mg by mouth daily.   furosemide (LASIX) 80 MG tablet Take 1 tablet (80 mg total) by mouth 2 (two) times daily.   isosorbide mononitrate (IMDUR) 30 MG 24 hr tablet Take 1 tablet (30 mg total) by mouth daily.   levothyroxine (SYNTHROID) 125 MCG tablet Take 1 tablet (125 mcg total) by mouth daily before breakfast.   loratadine (CLARITIN) 10 MG tablet Take 10 mg by mouth daily.   LORazepam (ATIVAN) 0.5 MG tablet Take 1 tablet (0.5 mg total) by mouth daily as needed for anxiety or sleep.   montelukast (SINGULAIR) 10 MG tablet TAKE 1 TABLET(10 MG) BY MOUTH AT BEDTIME   Multiple Vitamin (MULTIVITAMIN) tablet Take 1 tablet by mouth daily.   nitroGLYCERIN (NITROSTAT) 0.4 MG SL tablet Place 1 tablet (0.4 mg total) under the tongue every 5 (five) minutes as needed for chest pain.   PATADAY 0.1 % ophthalmic solution Place into both eyes as needed.    senna (SENOKOT) 8.6 MG tablet Take 1 tablet by mouth daily.  sertraline (ZOLOFT) 100 MG tablet Take 2 tablets (200 mg total) by mouth daily.   spironolactone (ALDACTONE) 25 MG tablet Take 0.5 tablets (12.5 mg total) by mouth daily.   traZODone (DESYREL) 50 MG tablet Take 1 tablet (50 mg total) by mouth at bedtime.   No facility-administered encounter medications on file as of 06/11/2021.    Patient Active Problem List   Diagnosis Date Noted   Hallux limitus, acquired, right 11/12/2019   Pre-diabetes 10/17/2019   Recurrent major depressive disorder, in partial remission ([place])  04/16/2019   Morbid obesity ([place]) 04/16/2019   Vasomotor rhinitis 10/25/2018   Hearing loss 08/28/2018   Osteoarthritis 08/28/2018   Tinnitus 08/28/2018   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 ([place]) 07/31/2018   TIA (transient ischemic attack) 07/23/2018   Slurred speech 07/23/2018   Right arm weakness 07/23/2018   Hypokalemia 07/23/2018   Anxiety 07/20/2018   Atherosclerosis of native coronary artery of native heart with stable angina pectoris ([place]) 06/16/2018   Hx of CABG 06/16/2018   Mixed hyperlipidemia 06/16/2018   Benign essential HTN 06/16/2018   History of cerebrovascular accident (CVA) with residual deficit 45/03/8880   Systolic CHF ([place]) 80/03/4915   Hypertension 02/15/2018   S/P total knee arthroplasty, right 02/15/2018   Primary osteoarthritis of both knees 11/25/2017   Abnormal nuclear stress test 06/30/2016   Hallux limitus of right foot 10/22/2015   Anesthesia complication 91/50/5697   GERD (gastroesophageal reflux disease) 10/10/2015   Hypothyroidism 10/10/2015   OSA on CPAP 01/10/2007   Diagnosis unknown 01/12/2003    Conditions to be addressed/monitored:CHF, HTN, HLD, Depression, and Falls and safety concerns  Care Plan : RNCM: General Plan of Care (Adult) for Chronic Disease Management and Care Coordination Needs  Updates made by Vanita Ingles, RN since 06/11/2021 12:00 AM     Problem: RNCM: Development of Plan of Care For Chronic Disease Management (HF, HTN, HLD, Depression, Falls)   Priority: High     Long-Range Goal: RNCM: Effective Management of Plan of Care For Chronic Disease Management (HF, HTN, HLD, Depression, Falls)   Start Date: 11/24/2020  Expected End Date: 11/24/2021  Priority: High  Note:   Current Barriers:  Knowledge Deficits related to plan of care for management of CHF, Depression: depressed mood, and falls prevention and safety   Chronic Disease Management support and education needs related to CHF, HTN,  HLD, Depression: depressed mood, and falls and safety  RNCM Clinical Goal(s):  Patient will verbalize understanding of plan for management of CHF, HTN, HLD, Depression, and falls and safety as evidenced by following the plan of care, compliance with medications, compliance with dietary restrictions  take all medications exactly as prescribed and will call provider for medication related questions as evidenced by compliance with medications and calling for needed refills before running out    attend all scheduled medical appointments: 07-03-2021 at 200 pm as evidenced by keeping appointments and calling if appointment needs to be rescheduled        demonstrate improved and ongoing adherence to prescribed treatment plan for CHF, HTN, HLD, Depression, and Falls and safety preventions as evidenced by working with the CCM team and pcp to effectively manage chronic conditions  demonstrate a decrease in CHF, HTN, HLD, Depression, and falls and safety exacerbations  as evidenced by stable conditions, calling for changes, and working with the CCM team to optimize health and well being demonstrate ongoing self health care management ability effective management of chronic conditions as  evidenced by  working with the CCM team through collaboration with Consulting civil engineer, provider, and care team.   Interventions: 1:1 collaboration with primary care provider regarding development and update of comprehensive plan of care as evidenced by provider attestation and co-signature Inter-disciplinary care team collaboration (see longitudinal plan of care) Evaluation of current treatment plan related to  self management and patient's adherence to plan as established by provider   SDOH Barriers (Status: Goal on Track (progressing): YES.) Long Term Goal  Patient interviewed and SDOH assessment performed        Patient interviewed and appropriate assessments performed Provided patient with information about resources  available and the care guides being available to assist with changes or new needs Discussed plans with patient for ongoing care management follow up and provided patient with direct contact information for care management team Advised patient to call for changes in SDOH, questions and concerns Eye exam scheduled for 07-08-2021. The patient likely will be set up for cataract surgery    Heart Failure Interventions:  (Status: Goal on Track (progressing): YES.)  Long Term Goal  Basic overview and discussion of pathophysiology of Heart Failure reviewed. 06-11-2021: The patient is compliant with the plan of care. The patient has good support system from his wife. The patient states that he is not having any unusual edema present. He feels his HF is stable at this time. Sees specialist on a regular basis.  Provided education on low sodium diet. 06-11-2021: Is compliant with a heart healthy diet  Reviewed Heart Failure Action Plan in depth and provided written copy Assessed need for readable accurate scales in home Provided education about placing scale on hard, flat surface Advised patient to weigh each morning after emptying bladder Discussed importance of daily weight and advised patient to weigh and record daily. 06-11-2021: Review and education done Reviewed role of diuretics in prevention of fluid overload and management of heart failure. 06-11-2021: The patient is compliant with medications. His wife fixes his medication box for a month at a time.  Discussed the importance of keeping all appointments with provider. 06-11-2021: The patient keeps appointments with provider, next with pcp on 07-03-2021 at 2 pm Provided patient with education about the role of exercise in the management of heart failure Advised patient to discuss new changes, edema more than usual with provider  Depression   (Status: Goal on Track (progressing): YES.) Long Term Goal  Evaluation of current treatment plan related to Depression,  Mental Health Concerns  self-management and patient's adherence to plan as established by provider. 01-08-2021: The patient states he feels "Out of it today". The patients wife states he seemed to be uptight earlier and not feeling well so she gave him some anxiety medications and that has helped him settle down. She states they were busy and over at their daughters a lot during Christmas. The patient denies any acute distress. Empathetic listening and support given. 03-05-2021: The patient states he is doing well and denies any acute changes in his depression or mental health status. He states that he had good results from his cataract surgery. States he is resting well and eating well. Likes the weather we are having currently. Will continue to monitor. 03-16-2021: The patient is concerned because his CPAP machine is not working properly and has not been the last several nights. He and his wife called to illicit the help of the [facility]. The patient states that he does not know what is wrong with it. See OSA  plan of care for more information. 04-09-2021: The patient is doing well and excited about his birthday coming up next week. The patient states he never thought he would live to see 81 years old. His family has plans to take him out and celebrate. He said last week he had a not so good week but he is having a better week this week. Denies any acute findings. 06-11-2021: The patient is doing well and has celebrated his birthday since last outreach. He did have a fall on that day and had to go to the ER.  He is doing well and was happy he got to see his daughter and her partner that live in [place]. The patient denies any acute findings today related to depression or mental health and well being.  Discussed plans with patient for ongoing care management follow up and provided patient with direct contact information for care management team Advised patient to call for changes in mood, anxiety, depression; Provided  education to patient re: staying positive, being around positive people, and reporting new changes in depression; Reviewed medications with patient and discussed compliance. 06-11-2021: Is compliant with his medications. ; Reviewed scheduled/upcoming provider appointments including 07-03-2021 at 2 pm; Discussed plans with patient for ongoing care management follow up and provided patient with direct contact information for care management team; Advised patient to discuss changes in depression with provider; Screening for signs and symptoms of depression related to chronic disease state;  Assessed social determinant of health barriers;  11-27-2020: Call made back to the patient and spoke to the patients wife Arbie Cookey, the patient has a place on his nose that comes up and goes back down. The patient and wife were asking about referral to dermatologist. Collaboration with the pcp and the patient saw Dr. Nehemiah Massed in the summer of 2021 and had places removed. The patient should not need another referral. Information provided to the patient and the patients wife. The number to call and make an appointment is [phone]. Reviewed with the patient and the patients wife and understanding was verbalized. 03-05-2021: The patient has been to the dermatologist and is doing well. No new changes.   Hyperlipidemia:  (Status: Goal on Track (progressing): YES.) Long Term Goal  Lab Results  Component Value Date   CHOL 130 12/24/2020   HDL 36 (L) 12/24/2020   LDLCALC 71 12/24/2020   TRIG 152 (H) 12/24/2020   CHOLHDL 3.6 12/24/2020     Medication review performed; medication list updated in electronic medical record. 06-11-2021: The patient takes Lipitor 80 mg daily. His wife fixes his pill box for a month at a time and keeps up with refills. Denies any new concerns at this time with medication management Provider established cholesterol goals reviewed. 04-09-2020: Review of goals of cholesterol levels Counseled on  importance of regular laboratory monitoring as prescribed. 06-11-2021: Review of regular labwork. The patient has on a regular basis Provided HLD educational materials; Reviewed role and benefits of statin for ASCVD risk reduction; Discussed strategies to manage statin-induced myalgias; Reviewed importance of limiting foods high in cholesterol; Reviewed exercise goals and target of 150 minutes per week; Screening for signs and symptoms of depression related to chronic disease state;  Assessed social determinant of health barriers;  Review of pharm D notes and outreach on 11-26-2020 for medication questions the patients wife had with interactions and making sure what the patient was taking was safe. 06-11-2021: The patient and patients wife work with the pharm D on a consistent basis.  Hypertension: (Status: Goal on Track (progressing): YES.) Last practice recorded BP readings:  BP Readings from Last 3 Encounters:  04/14/21 125/75  01/19/21 101/81  12/31/20 124/64  Most recent eGFR/CrCl:  Lab Results  Component Value Date   EGFR 84 12/24/2020    No components found for: CRCL  Evaluation of current treatment plan related to hypertension self management and patient's adherence to plan as established by provider. 01-08-2021: The patient has good blood pressures. Today during the call his blood pressure was 123/63 with a pulse of 70 and 128/76 with pulse of 75;  He states today was not the best of days but he is feeling better now. 06-11-2021: The patient denies any issues with HTN or heart health. Will continue to monitor for changes. Feels like he is doing well with management of HTN and heart health.  Provided education to patient re: stroke prevention, s/s of heart attack and stroke; Reviewed prescribed diet heart healthy. 06-11-2021: The patient is compliant with heart healthy diet.  Reviewed medications with patient and discussed importance of compliance. 06-11-2021: The patient is compliant with  medications. Works with [facility] on a regular basis. ;  Discussed plans with patient for ongoing care management follow up and provided patient with direct contact information for care management team; Advised patient, providing education and rationale, to monitor blood pressure daily and record, calling PCP for findings outside established parameters;  Advised patient to discuss changes in blood pressure trends with provider; Provided education on prescribed diet heart healthy;  Discussed complications of poorly controlled blood pressure such as heart disease, stroke, circulatory complications, vision complications, kidney impairment, sexual dysfunction;    Falls:  (Status: Goal on Track (progressing): YES.) Long Term Goal  Provided written and verbal education re: potential causes of falls and Fall prevention strategies Reviewed medications and discussed potential side effects of medications such as dizziness and frequent urination Advised patient of importance of notifying provider of falls. 03-05-2021: The patient had a fall today and reported to the [facility]. 04-09-2021: Denies any new falls. States he has been safe. 06-11-2021: The patient had a new fall on 04-14-2021 when his wife says he missed a step going into a restaurant. The patient states he tripped over something. The patient went to urgent care and then was sent to the ER. They did xrays and everything checked out good. The patient denies any new concerns related to the fall. Education and support given.  Assessed for signs and symptoms of orthostatic hypotension Assessed for falls since last encounter. 11-24-2020: last reported fall at 10-07-2020. 01-08-2021: Denies any new falls. Is being safe. Will continue to monitor. 03-05-2021: Had a fall today in the bedroom. Was getting things from the closet and tripped over the dog steps. The patient denies any injuries or hitting head. The patient states his wife came and assisted him and he is doing well  today. Denies pain or discomfort. 06-11-2021: The last reported fall was 04-14-2021 on the patients birthday. The patient denies any new falls at this time. Review of safety and fall precautions.  Assessed patients knowledge of fall risk prevention secondary to previously provided education Provided patient information for fall alert systems Assessed working status of life alert bracelet and patient adherence Advised patient to discuss new falls with provider. 03-05-2021: Education provided on fall prevention and safety. Ask the patient to use his cane or his walker when ambulating. The patient states he does not always do this. Education and support given. 06-11-2021: The  patients wife states that sometimes he does not always want to exercise and sometimes he doesn't do it for several days and then he tries and over does it. The patients goal is 8000 to 10000 steps a day. He is still utilizing the cubbi and encouraged him to use daily to strengthen his legs and feet.    OSA with CPAP  (Status: Goal Met.) Long Term Goal  06-11-2021: The patient has his CPAP. No new needs at this time. Closing goal Evaluation of current treatment plan related to  OSA ,  self-management and patient's adherence to plan as established by provider. 06-11-2021: No new issues related to sleep apnea.  Discussed plans with patient for ongoing care management follow up and provided patient with direct contact information for care management team Advised patient to call [facility] and talk to the provider there about his cpap equipment. The patient sees Chipper Herb at [facility] and last saw her in March of last year. The patient and his wife were concerned that they needed a referral. Education provided. The cpap equipment that the patient uses is different from what his wife has. He states it has not been working correctly x 1 week. The patient advised to call the clinic and discuss recommendations for what he needed to do to get the help he needed for his  cpap machine. Education and support given; Provided education to patient re: review of steps to take to get the needed help for trouble- shooting the issue related to problems he is having with his CPAP machine. ; Discussed plans with patient for ongoing care management follow up and provided patient with direct contact information for care management team; Advised patient to discuss new concerns or questions about his OSA and sleep apnea machine with provider;   Patient Goals/Self-Care Activities: Patient will self administer medications as prescribed as evidenced by self report/primary caregiver report  Patient will attend all scheduled provider appointments as evidenced by clinician review of documented attendance to scheduled appointments and patient/caregiver report Patient will call pharmacy for medication refills as evidenced by patient report and review of pharmacy fill history as appropriate Patient will attend church or other social activities as evidenced by patient report Patient will continue to perform ADL's independently as evidenced by patient/caregiver report Patient will continue to perform IADL's independently as evidenced by patient/caregiver report Patient will call provider office for new concerns or questions as evidenced by review of documented incoming telephone call notes and patient report Patient will work with BSW to address care coordination needs and will continue to work with the clinical team to address health care and disease management related needs as evidenced by documented adherence to scheduled care management/care coordination appointments call office if I gain more than 2 pounds in one day or 5 pounds in one week keep legs up while sitting track weight in diary use salt in moderation watch for swelling in feet, ankles and legs every day weigh myself daily develop a rescue plan follow rescue plan if symptoms flare-up eat more whole grains, fruits and  vegetables, lean meats and healthy fats know when to call the doctorfor changes in sx and sx of HF exacerbation  track symptoms and what helps feel better or worse dress right for the weather, hot or cold - check blood pressure weekly - choose a place to take my blood pressure (home, clinic or office, retail store) - write blood pressure results in a log or diary - learn about high blood  pressure - keep a blood pressure log - take blood pressure log to all doctor appointments - call doctor for signs and symptoms of high blood pressure - develop an action plan for high blood pressure - keep all doctor appointments - take medications for blood pressure exactly as prescribed - report new symptoms to your doctor - eat more whole grains, fruits and vegetables, lean meats and healthy fats - call for medicine refill 2 or 3 days before it runs out - take all medications exactly as prescribed - call doctor with any symptoms you believe are related to your medicine - call doctor when you experience any new symptoms - go to all doctor appointments as scheduled - adhere to prescribed diet: heart healthy diet        Plan:Telephone follow up appointment with care management team member scheduled for:  08-27-2021 at 1 pm  [place], MSN, [place] Mobile: [phone]

## 2021-06-11 NOTE — Patient Instructions (Signed)
Visit Information  Thank you for taking time to visit with me today. Please don't hesitate to contact me if I can be of assistance to you before our next scheduled telephone appointment.  Following are the goals we discussed today:  SDOH Barriers (Status: Goal on Track (progressing): YES.) Long Term Goal  Patient interviewed and SDOH assessment performed        Patient interviewed and appropriate assessments performed Provided patient with information about resources available and the care guides being available to assist with changes or new needs Discussed plans with patient for ongoing care management follow up and provided patient with direct contact information for care management team Advised patient to call for changes in SDOH, questions and concerns Eye exam scheduled for 07-08-2021. The patient likely will be set up for cataract surgery       Heart Failure Interventions:  (Status: Goal on Track (progressing): YES.)  Long Term Goal  Basic overview and discussion of pathophysiology of Heart Failure reviewed. 06-11-2021: The patient is compliant with the plan of care. The patient has good support system from his wife. The patient states that he is not having any unusual edema present. He feels his HF is stable at this time. Sees specialist on a regular basis.  Provided education on low sodium diet. 06-11-2021: Is compliant with a heart healthy diet  Reviewed Heart Failure Action Plan in depth and provided written copy Assessed need for readable accurate scales in home Provided education about placing scale on hard, flat surface Advised patient to weigh each morning after emptying bladder Discussed importance of daily weight and advised patient to weigh and record daily. 06-11-2021: Review and education done Reviewed role of diuretics in prevention of fluid overload and management of heart failure. 06-11-2021: The patient is compliant with medications. His wife fixes his medication box for a month  at a time.  Discussed the importance of keeping all appointments with provider. 06-11-2021: The patient keeps appointments with provider, next with pcp on 07-03-2021 at 2 pm Provided patient with education about the role of exercise in the management of heart failure Advised patient to discuss new changes, edema more than usual with provider   Depression   (Status: Goal on Track (progressing): YES.) Long Term Goal  Evaluation of current treatment plan related to Depression, Mental Health Concerns  self-management and patient's adherence to plan as established by provider. 01-08-2021: The patient states he feels "Out of it today". The patients wife states he seemed to be uptight earlier and not feeling well so she gave him some anxiety medications and that has helped him settle down. She states they were busy and over at their daughters a lot during Christmas. The patient denies any acute distress. Empathetic listening and support given. 03-05-2021: The patient states he is doing well and denies any acute changes in his depression or mental health status. He states that he had good results from his cataract surgery. States he is resting well and eating well. Likes the weather we are having currently. Will continue to monitor. 03-16-2021: The patient is concerned because his CPAP machine is not working properly and has not been the last several nights. He and his wife called to illicit the help of the [facility]. The patient states that he does not know what is wrong with it. See OSA plan of care for more information. 04-09-2021: The patient is doing well and excited about his birthday coming up next week. The patient states he never thought he  would live to see 81 years old. His family has plans to take him out and celebrate. He said last week he had a not so good week but he is having a better week this week. Denies any acute findings. 06-11-2021: The patient is doing well and has celebrated his birthday since last outreach.  He did have a fall on that day and had to go to the ER.  He is doing well and was happy he got to see his daughter and her partner that live in [place]. The patient denies any acute findings today related to depression or mental health and well being.  Discussed plans with patient for ongoing care management follow up and provided patient with direct contact information for care management team Advised patient to call for changes in mood, anxiety, depression; Provided education to patient re: staying positive, being around positive people, and reporting new changes in depression; Reviewed medications with patient and discussed compliance. 06-11-2021: Is compliant with his medications. ; Reviewed scheduled/upcoming provider appointments including 07-03-2021 at 2 pm; Discussed plans with patient for ongoing care management follow up and provided patient with direct contact information for care management team; Advised patient to discuss changes in depression with provider; Screening for signs and symptoms of depression related to chronic disease state;  Assessed social determinant of health barriers;  11-27-2020: Call made back to the patient and spoke to the patients wife Arbie Cookey, the patient has a place on his nose that comes up and goes back down. The patient and wife were asking about referral to dermatologist. Collaboration with the pcp and the patient saw Dr. Nehemiah Massed in the summer of 2021 and had places removed. The patient should not need another referral. Information provided to the patient and the patients wife. The number to call and make an appointment is [phone]. Reviewed with the patient and the patients wife and understanding was verbalized. 03-05-2021: The patient has been to the dermatologist and is doing well. No new changes.    Hyperlipidemia:  (Status: Goal on Track (progressing): YES.) Long Term Goal       Lab Results  Component Value Date    CHOL 130 12/24/2020    HDL 36 (L)  12/24/2020    LDLCALC 71 12/24/2020    TRIG 152 (H) 12/24/2020    CHOLHDL 3.6 12/24/2020      Medication review performed; medication list updated in electronic medical record. 06-11-2021: The patient takes Lipitor 80 mg daily. His wife fixes his pill box for a month at a time and keeps up with refills. Denies any new concerns at this time with medication management Provider established cholesterol goals reviewed. 04-09-2020: Review of goals of cholesterol levels Counseled on importance of regular laboratory monitoring as prescribed. 06-11-2021: Review of regular labwork. The patient has on a regular basis Provided HLD educational materials; Reviewed role and benefits of statin for ASCVD risk reduction; Discussed strategies to manage statin-induced myalgias; Reviewed importance of limiting foods high in cholesterol; Reviewed exercise goals and target of 150 minutes per week; Screening for signs and symptoms of depression related to chronic disease state;  Assessed social determinant of health barriers;  Review of pharm D notes and outreach on 11-26-2020 for medication questions the patients wife had with interactions and making sure what the patient was taking was safe. 06-11-2021: The patient and patients wife work with the pharm D on a consistent basis.    Hypertension: (Status: Goal on Track (progressing): YES.) Last practice recorded BP readings:  BP Readings from Last 3 Encounters:  04/14/21 125/75  01/19/21 101/81  12/31/20 124/64  Most recent eGFR/CrCl:       Lab Results  Component Value Date    EGFR 84 12/24/2020    No components found for: CRCL   Evaluation of current treatment plan related to hypertension self management and patient's adherence to plan as established by provider. 01-08-2021: The patient has good blood pressures. Today during the call his blood pressure was 123/63 with a pulse of 70 and 128/76 with pulse of 75;  He states today was not the best of days but he is  feeling better now. 06-11-2021: The patient denies any issues with HTN or heart health. Will continue to monitor for changes. Feels like he is doing well with management of HTN and heart health.  Provided education to patient re: stroke prevention, s/s of heart attack and stroke; Reviewed prescribed diet heart healthy. 06-11-2021: The patient is compliant with heart healthy diet.  Reviewed medications with patient and discussed importance of compliance. 06-11-2021: The patient is compliant with medications. Works with Arabi D on a regular basis. ;  Discussed plans with patient for ongoing care management follow up and provided patient with direct contact information for care management team; Advised patient, providing education and rationale, to monitor blood pressure daily and record, calling PCP for findings outside established parameters;  Advised patient to discuss changes in blood pressure trends with provider; Provided education on prescribed diet heart healthy;  Discussed complications of poorly controlled blood pressure such as heart disease, stroke, circulatory complications, vision complications, kidney impairment, sexual dysfunction;      Falls:  (Status: Goal on Track (progressing): YES.) Long Term Goal  Provided written and verbal education re: potential causes of falls and Fall prevention strategies Reviewed medications and discussed potential side effects of medications such as dizziness and frequent urination Advised patient of importance of notifying provider of falls. 03-05-2021: The patient had a fall today and reported to the [facility]. 04-09-2021: Denies any new falls. States he has been safe. 06-11-2021: The patient had a new fall on 04-14-2021 when his wife says he missed a step going into a restaurant. The patient states he tripped over something. The patient went to urgent care and then was sent to the ER. They did xrays and everything checked out good. The patient denies any new concerns  related to the fall. Education and support given.  Assessed for signs and symptoms of orthostatic hypotension Assessed for falls since last encounter. 11-24-2020: last reported fall at 10-07-2020. 01-08-2021: Denies any new falls. Is being safe. Will continue to monitor. 03-05-2021: Had a fall today in the bedroom. Was getting things from the closet and tripped over the dog steps. The patient denies any injuries or hitting head. The patient states his wife came and assisted him and he is doing well today. Denies pain or discomfort. 06-11-2021: The last reported fall was 04-14-2021 on the patients birthday. The patient denies any new falls at this time. Review of safety and fall precautions.  Assessed patients knowledge of fall risk prevention secondary to previously provided education Provided patient information for fall alert systems Assessed working status of life alert bracelet and patient adherence Advised patient to discuss new falls with provider. 03-05-2021: Education provided on fall prevention and safety. Ask the patient to use his cane or his walker when ambulating. The patient states he does not always do this. Education and support given. 06-11-2021: The patients wife states that  sometimes he does not always want to exercise and sometimes he doesn't do it for several days and then he tries and over does it. The patients goal is 8000 to 10000 steps a day. He is still utilizing the cubbi and encouraged him to use daily to strengthen his legs and feet.      OSA with CPAP  (Status: Goal Met.) Long Term Goal  06-11-2021: The patient has his CPAP. No new needs at this time. Closing goal Evaluation of current treatment plan related to  OSA ,  self-management and patient's adherence to plan as established by provider. 06-11-2021: No new issues related to sleep apnea.  Discussed plans with patient for ongoing care management follow up and provided patient with direct contact information for care management  team Advised patient to call [facility] and talk to the provider there about his cpap equipment. The patient sees Chipper Herb at [facility] and last saw her in March of last year. The patient and his wife were concerned that they needed a referral. Education provided. The cpap equipment that the patient uses is different from what his wife has. He states it has not been working correctly x 1 week. The patient advised to call the clinic and discuss recommendations for what he needed to do to get the help he needed for his cpap machine. Education and support given; Provided education to patient re: review of steps to take to get the needed help for trouble- shooting the issue related to problems he is having with his CPAP machine. ; Discussed plans with patient for ongoing care management follow up and provided patient with direct contact information for care management team; Advised patient to discuss new concerns or questions about his OSA and sleep apnea machine with provider;   Our next appointment is by telephone on 08-27-2021  at 145 pm  Please call the care guide team at [phone] if you need to cancel or reschedule your appointment.   If you are experiencing a Mental Health or [place] or need someone to talk to, please call the Suicide and Crisis Lifeline: 988 call the [place] National Suicide Prevention Lifeline: 925-163-5550 or TTY: 210 584 1285 TTY 438-126-8935) to talk to a trained counselor call 1-800-273-TALK (toll free, 24 hour hotline)   Patient verbalizes understanding of instructions and care plan provided today and agrees to view in [place]. Active MyChart status and patient understanding of how to access instructions and care plan via MyChart confirmed with patient.     Noreene Larsson RN, MSN, [place] Mobile: [phone]

## 2021-06-11 NOTE — Telephone Encounter (Signed)
Sent via Interface, requested too early. Requested Prescriptions  Pending Prescriptions Disp Refills  . levothyroxine (SYNTHROID) 125 MCG tablet [Pharmacy Med Name: LEVOTHYROXINE 0.125MG  ( ) TAB] 90 tablet 3    Sig: TAKE 1 TABLET(125 MCG) BY MOUTH DAILY BEFORE BREAKFAST     Endocrinology:  Hypothyroid Agents Passed - 06/11/2021  6:16 AM      Passed - TSH in normal range and within 360 days    TSH  Date Value Ref Range Status  12/24/2020 1.81 0.40 - 4.50 mIU/L Final         Passed - Valid encounter within last 12 months    Recent Outpatient Visits          5 months ago Psychophysiologic insomnia   [facility], Netta [name], DO   7 months ago Anxiety   [facility] [place], Netta [name], DO   1 year ago Benign essential HTN   [facility] [place], Netta [name], DO   1 year ago Annual physical exam   [facility] [name], DO   2 years ago Anxiety   [facility] [name], Netta [name], DO      Future Appointments            In 3 weeks [name], Netta [name], DO [facility]   In 2 months  [facility]           . furosemide (LASIX) 80 MG tablet [Pharmacy Med Name: FUROSEMIDE 80MG  TABLETS] 180 tablet 3    Sig: TAKE 1 TABLET(80 MG) BY MOUTH TWICE DAILY     Cardiovascular:  Diuretics - Loop Failed - 06/11/2021  6:16 AM      Failed - Mg Level in normal range and within 180 days    Magnesium  Date Value Ref Range Status  07/24/2018 2.2 1.7 - 2.4 mg/dL Final    Comment:    Performed at [facility], [address] [place], [place]         Passed - K in normal range and within 180 days    Potassium  Date Value Ref Range Status  04/14/2021 3.7 3.5 - 5.1 mmol/L Final         Passed - Ca in normal range and within 180 days    Calcium  Date Value Ref Range Status  04/14/2021 9.1 8.9 - 10.3 mg/dL Final    Calcium, Ion  Date Value Ref Range Status  07/23/2018 1.09 (L) 1.15 - 1.40 mmol/L Final         Passed - Na in normal range and within 180 days    Sodium  Date Value Ref Range Status  04/14/2021 139 135 - 145 mmol/L Final         Passed - Cr in normal range and within 180 days    Creat  Date Value Ref Range Status  12/24/2020 0.93 0.70 - 1.28 mg/dL Final   Creatinine, Ser  Date Value Ref Range Status  04/14/2021 0.91 0.61 - 1.24 mg/dL Final         Passed - Cl in normal range and within 180 days    Chloride  Date Value Ref Range Status  04/14/2021 103 98 - 111 mmol/L Final         Passed - Last BP in normal range    BP Readings from Last 1 Encounters:  04/14/21 125/75  Passed - Valid encounter within last 6 months    Recent Outpatient Visits          5 months ago Psychophysiologic insomnia   [facility] [name], DO   7 months ago Anxiety   [facility] [place], Netta [name], DO   1 year ago Benign essential HTN   [facility] [name], DO   1 year ago Annual physical exam   [facility] [name], DO   2 years ago Anxiety   [facility] [name], Netta [name], DO      Future Appointments            In 3 weeks [name], Netta [name], DO [facility]   In 2 months  [facility] [place]

## 2021-06-15 ENCOUNTER — Ambulatory Visit (INDEPENDENT_AMBULATORY_CARE_PROVIDER_SITE_OTHER): Payer: Medicare PPO

## 2021-06-15 DIAGNOSIS — I63412 Cerebral infarction due to embolism of left middle cerebral artery: Secondary | ICD-10-CM

## 2021-06-15 LAB — CUP PACEART REMOTE DEVICE CHECK
Date Time Interrogation Session: 20230531232303
Implantable Pulse Generator Implant Date: 20200714

## 2021-07-01 NOTE — Progress Notes (Signed)
Carelink Summary Report / Loop Recorder 

## 2021-07-03 ENCOUNTER — Ambulatory Visit: Payer: Self-pay

## 2021-07-03 ENCOUNTER — Ambulatory Visit (INDEPENDENT_AMBULATORY_CARE_PROVIDER_SITE_OTHER): Payer: Medicare PPO | Admitting: Family Medicine

## 2021-07-03 ENCOUNTER — Other Ambulatory Visit: Payer: Self-pay | Admitting: Family Medicine

## 2021-07-03 ENCOUNTER — Encounter: Payer: Self-pay | Admitting: Family Medicine

## 2021-07-03 VITALS — BP 113/67 | HR 70 | Ht 68.0 in | Wt 243.8 lb

## 2021-07-03 DIAGNOSIS — G4733 Obstructive sleep apnea (adult) (pediatric): Secondary | ICD-10-CM | POA: Diagnosis not present

## 2021-07-03 DIAGNOSIS — I5022 Chronic systolic (congestive) heart failure: Secondary | ICD-10-CM

## 2021-07-03 DIAGNOSIS — F3341 Major depressive disorder, recurrent, in partial remission: Secondary | ICD-10-CM | POA: Diagnosis not present

## 2021-07-03 DIAGNOSIS — F5104 Psychophysiologic insomnia: Secondary | ICD-10-CM

## 2021-07-03 DIAGNOSIS — Z9989 Dependence on other enabling machines and devices: Secondary | ICD-10-CM

## 2021-07-03 DIAGNOSIS — E039 Hypothyroidism, unspecified: Secondary | ICD-10-CM

## 2021-07-03 DIAGNOSIS — E782 Mixed hyperlipidemia: Secondary | ICD-10-CM

## 2021-07-03 DIAGNOSIS — I6930 Unspecified sequelae of cerebral infarction: Secondary | ICD-10-CM | POA: Diagnosis not present

## 2021-07-03 DIAGNOSIS — R7303 Prediabetes: Secondary | ICD-10-CM

## 2021-07-03 DIAGNOSIS — I25118 Atherosclerotic heart disease of native coronary artery with other forms of angina pectoris: Secondary | ICD-10-CM

## 2021-07-03 DIAGNOSIS — I10 Essential (primary) hypertension: Secondary | ICD-10-CM

## 2021-07-03 DIAGNOSIS — J3089 Other allergic rhinitis: Secondary | ICD-10-CM

## 2021-07-03 DIAGNOSIS — Z0000 Encounter for general adult medical examination without abnormal findings: Secondary | ICD-10-CM

## 2021-07-03 DIAGNOSIS — F419 Anxiety disorder, unspecified: Secondary | ICD-10-CM

## 2021-07-03 DIAGNOSIS — R351 Nocturia: Secondary | ICD-10-CM

## 2021-07-03 DIAGNOSIS — I25810 Atherosclerosis of coronary artery bypass graft(s) without angina pectoris: Secondary | ICD-10-CM

## 2021-07-03 LAB — POCT GLYCOSYLATED HEMOGLOBIN (HGB A1C): Hemoglobin A1C: 6.1 % — AB (ref 4.0–5.6)

## 2021-07-03 MED ORDER — CLOPIDOGREL BISULFATE 75 MG PO TABS: 75.0000 mg | ORAL_TABLET | Freq: Every day | ORAL | 3 refills | Status: DC

## 2021-07-03 MED ORDER — ISOSORBIDE MONONITRATE ER 30 MG PO TB24: 30.0000 mg | ORAL_TABLET | Freq: Every day | ORAL | 3 refills | Status: DC

## 2021-07-03 MED ORDER — MONTELUKAST SODIUM 10 MG PO TABS: 10.0000 mg | ORAL_TABLET | Freq: Every day | ORAL | 3 refills | Status: DC

## 2021-07-03 MED ORDER — SPIRONOLACTONE 25 MG PO TABS: 12.5000 mg | ORAL_TABLET | Freq: Every day | ORAL | 3 refills | Status: DC

## 2021-07-03 MED ORDER — FUROSEMIDE 80 MG PO TABS: 80.0000 mg | ORAL_TABLET | Freq: Two times a day (BID) | ORAL | 3 refills | Status: DC

## 2021-07-03 MED ORDER — SERTRALINE HCL 100 MG PO TABS: 200.0000 mg | ORAL_TABLET | Freq: Every day | ORAL | 3 refills | Status: DC

## 2021-07-03 MED ORDER — LORAZEPAM 0.5 MG PO TABS: 0.5000 mg | ORAL_TABLET | Freq: Every day | ORAL | 1 refills | Status: DC | PRN

## 2021-07-03 NOTE — Assessment & Plan Note (Signed)
Stable, improved Continue SSRI + Trazodone for insomnia

## 2021-07-03 NOTE — Assessment & Plan Note (Signed)
Controlled A1c at 6.1 Stable from previous

## 2021-07-08 DIAGNOSIS — H353110 Nonexudative age-related macular degeneration, right eye, stage unspecified: Secondary | ICD-10-CM | POA: Diagnosis not present

## 2021-07-10 DIAGNOSIS — F32A Depression, unspecified: Secondary | ICD-10-CM

## 2021-07-10 DIAGNOSIS — I110 Hypertensive heart disease with heart failure: Secondary | ICD-10-CM

## 2021-07-10 DIAGNOSIS — E782 Mixed hyperlipidemia: Secondary | ICD-10-CM | POA: Diagnosis not present

## 2021-07-10 DIAGNOSIS — I5020 Unspecified systolic (congestive) heart failure: Secondary | ICD-10-CM | POA: Diagnosis not present

## 2021-07-19 LAB — CUP PACEART REMOTE DEVICE CHECK
Date Time Interrogation Session: 20230703232018
Implantable Pulse Generator Implant Date: 20200714

## 2021-07-20 ENCOUNTER — Ambulatory Visit (INDEPENDENT_AMBULATORY_CARE_PROVIDER_SITE_OTHER): Payer: Medicare PPO

## 2021-07-20 DIAGNOSIS — I63412 Cerebral infarction due to embolism of left middle cerebral artery: Secondary | ICD-10-CM | POA: Diagnosis not present

## 2021-08-04 ENCOUNTER — Telehealth: Payer: Self-pay | Admitting: Family Medicine

## 2021-08-04 DIAGNOSIS — J309 Allergic rhinitis, unspecified: Secondary | ICD-10-CM

## 2021-08-04 MED ORDER — IPRATROPIUM BROMIDE 0.06 % NA SOLN: 2.0000 | Freq: Four times a day (QID) | NASAL | 2 refills | Status: DC | PRN

## 2021-08-04 NOTE — Telephone Encounter (Signed)
Pt is needing a past nasal spray refilled, was not using very frequent but lately using everyday "ipratropium atrovent" historical list, wants refill.  Last visit 6/23 Pt contact # [phone] or wife  [phone] Pharmacy  [id number] [place] - [address] AT [facility] [address]  [address] [place] [address]  Phone: [phone] Fax: [phone]

## 2021-08-04 NOTE — Telephone Encounter (Signed)
Ordered  Saralyn Pilar, DO [facility] 08/04/2021, 1:56 PM

## 2021-08-05 ENCOUNTER — Telehealth: Payer: Self-pay | Admitting: Pharmacist

## 2021-08-05 ENCOUNTER — Telehealth: Payer: Medicare PPO

## 2021-08-05 NOTE — Telephone Encounter (Signed)
  Chronic Care Management   Outreach Note  08/05/2021 Name: Basem Yannuzzi MRN: [id number] DOB: September 07, 1940  Referred by: [name], DO Reason for referral : No chief complaint on file.   Was unable to reach patient via telephone today and have left HIPAA compliant voicemail asking patient to return my call.    Follow Up Plan: Will collaborate with Care Guide to outreach to schedule follow up with me  Estelle Grumbles, PharmD, [facility] Clinical Pharmacist [facility] Care Management [phone]

## 2021-08-11 ENCOUNTER — Telehealth: Payer: Self-pay

## 2021-08-11 ENCOUNTER — Ambulatory Visit: Payer: Medicare PPO

## 2021-08-11 NOTE — Telephone Encounter (Signed)
Routing to [employer].  Can you contact [facility] to confirm that they are able to do Sleep Study for him?  If they can, please let me know how to order it - if they have an order form or any type of order in Epic etc.  3. In the past I believe he saw Pulmonologist to manage his CPAP machine. If he needs a new machine, it has been approximately 10 years since his last sleep study 2013, so he would likely need to start over with initial sleep study.  If he wanted to we could send to [facility] or to [facility] and they can help manage the sleep study and orders for CPAP.  Or we can order the sleep study.  Thanks, let me know!  Saralyn Pilar, DO [facility] 08/11/2021, 12:22 PM

## 2021-08-11 NOTE — Telephone Encounter (Signed)
Copied from CRM [id number]. Topic: Referral - Request for Referral >> Aug 11, 2021 11:13 AM Franchot Heidelberg wrote: Has patient seen PCP for this complaint? Yes *If NO, is insurance requiring patient see PCP for this issue before PCP can refer them? Referral for which specialty: Sleep Study  Preferred provider/office: [facility]  Reason for referral: Needs a new CPAP Machine

## 2021-08-13 NOTE — Progress Notes (Addendum)
Date:  08/14/2021   ID:  Luke Mckee, DOB 12-Apr-1940, MRN [id number]  Patient Location:  [address] [place] [address]   Provider location:   [facility], [place] office  PCP:  [name], DO  Cardiologist:  Hubbard Robinson [facility]  Chief Complaint  Patient presents with   6 month follow up     Patient c/o chest tightness, dizziness, fullness in his head, shortness of breath. Medications reviewed by the patient verbally.     History of Present Illness:    Luke Mckee is a 81 y.o. male  past medical history of CAD s/p CABG x 4 in [place] 2005, and stent placement x 2 in past 2-3 years, Anxiety HTN Recent lacunar stroke  prior CVA identified on outside imaging in [place] 04/2018,  Hospital 5 days, rehab 6 days Right side affected,  Loop monitor, no atrial fibrillation Ejection fraction 50% Second CVA 2021 Who presents for coronary artery disease, chronic dizziness  Last seen by myself in clinic December 2021 Has monthly loop recorder downloads, placed for history of stroke  Chronic issues with dizziness for many months Gets episodes of neck ache, headache, feels fuzzy in the head then gets dizzy Limits his ability to ambulate, exercise, work in the yard On Lasix 80 twice daily, spironolactone 12.5 daily atenolol 50 daily Imdur 30 Feels he is not dehydrated  No regular exercise program Reports having history of 2 strokes, last one 2 years ago, none recently Denies significant chest pain or shortness of breath on exertion  Has had weight loww 246 to 242 pounds  A1C 6.1  New labs pending to see primary care  Orthostatic today Supine 110/67 heart rate 65 Sitting 90/61 heart rate 72 Standing 89/50 heart rate 73 Standing 3 minutes 94/63 heart rate 75  Loop monitor in place, records reviewed, no atrial fibrillation  EKG personally reviewed by myself on todays visit Normal sinus rhythm rate 63 bpm old inferior MI no significant  ST-T wave changes  Other past medical history reviewed Cath possibly >2 years ago, stent x 1 Prior cath >3 years , stent x 1  Echocardiogram July 2020, EF 50%, mildly decreased RV function   Past Medical History:  Diagnosis Date   Anxiety    Arthritis    CAD (coronary artery disease) 2005   s/p CABG, DES   CHF (congestive heart failure) (HCC)    in EPIC care everywhere   Chronic kidney disease    COPD (chronic obstructive pulmonary disease) (HCC)    in EPIC care everywhere   Depression    Heart murmur    History of stroke    Hypothyroidism    OSA (obstructive sleep apnea)    Paroxysmal A-fib (HCC)    in EPIC careeverywhere   Past Surgical History:  Procedure Laterality Date   APPENDECTOMY     BUNIONECTOMY     CATARACT EXTRACTION     Right eye   CATARACT EXTRACTION W/PHACO Left 01/19/2021   Procedure: CATARACT EXTRACTION PHACO AND INTRAOCULAR LENS PLACEMENT (IOC) LEFT 3.09 00:28.3;  Surgeon: Nevada Crane, MD;  [facility];  Service: Ophthalmology;  Laterality: Left;   COLONOSCOPY  07/06/2004   CORONARY ARTERY BYPASS GRAFT  2005   LOOP RECORDER INSERTION N/A 07/25/2018   Procedure: LOOP RECORDER INSERTION;  Surgeon: Regan Lemming, MD;  Location: [facility];  Service: Cardiovascular;  Laterality: N/A;   TOTAL HIP ARTHROPLASTY Right 11/23/2011  TOTAL HIP ARTHROPLASTY Left 09/07/2011   TOTAL KNEE ARTHROPLASTY Right 02/15/2018   VASECTOMY  1978     Current Meds  Medication Sig   acetaminophen (TYLENOL) 650 MG CR tablet Take 1,300 mg by mouth at bedtime as needed.   albuterol (VENTOLIN HFA) 108 (90 Base) MCG/ACT inhaler Inhale 1-2 puffs into the lungs every 4 (four) hours as needed for wheezing or shortness of breath (cough).   aspirin 81 MG chewable tablet Chew 81 mg by mouth daily.   atenolol (TENORMIN) 50 MG tablet TAKE 1 TABLET(50 MG) BY MOUTH AT BEDTIME   atorvastatin (LIPITOR) 80 MG tablet TAKE 1 TABLET(80 MG) BY MOUTH DAILY    Cholecalciferol (VITAMIN D) 50 MCG (2000 UT) tablet Take 4,000 Units by mouth daily.   clopidogrel (PLAVIX) 75 MG tablet Take 1 tablet (75 mg total) by mouth daily.   clotrimazole-betamethasone (LOTRISONE) cream Apply twice a day for 1-2 weeks, may repeat if need in future   co-enzyme Q-10 30 MG capsule Take 100 mg by mouth daily.   diclofenac sodium (VOLTAREN) 1 % GEL Apply 2 g topically 4 (four) times daily as needed for pain. Foot pain   docusate sodium (COLACE) 100 MG capsule Take 200 mg by mouth daily.   furosemide (LASIX) 80 MG tablet Take 1 tablet (80 mg total) by mouth 2 (two) times daily.   ipratropium (ATROVENT) 0.06 % nasal spray Place 2 sprays into both nostrils 4 (four) times daily as needed for rhinitis.   isosorbide mononitrate (IMDUR) 30 MG 24 hr tablet Take 1 tablet (30 mg total) by mouth daily.   levothyroxine (SYNTHROID) 125 MCG tablet TAKE 1 TABLET(125 MCG) BY MOUTH DAILY BEFORE BREAKFAST   loratadine (CLARITIN) 10 MG tablet Take 10 mg by mouth daily.   LORazepam (ATIVAN) 0.5 MG tablet Take 1 tablet (0.5 mg total) by mouth daily as needed for anxiety or sleep.   montelukast (SINGULAIR) 10 MG tablet Take 1 tablet (10 mg total) by mouth at bedtime.   Multiple Vitamin (MULTIVITAMIN) tablet Take 1 tablet by mouth daily.   nitroGLYCERIN (NITROSTAT) 0.4 MG SL tablet Place 1 tablet (0.4 mg total) under the tongue every 5 (five) minutes as needed for chest pain.   PATADAY 0.1 % ophthalmic solution Place into both eyes as needed.    senna (SENOKOT) 8.6 MG tablet Take 1 tablet by mouth daily.   sertraline (ZOLOFT) 100 MG tablet Take 2 tablets (200 mg total) by mouth daily.   spironolactone (ALDACTONE) 25 MG tablet Take 0.5 tablets (12.5 mg total) by mouth daily.   traZODone (DESYREL) 50 MG tablet TAKE 1 TABLET(50 MG) BY MOUTH AT BEDTIME     Allergies:   Ace inhibitors, Cephalexin, Crestor [rosuvastatin calcium], and Tape   Social History   Tobacco Use   Smoking status: Never    Smokeless tobacco: Never  Vaping Use   Vaping Use: Never used  Substance Use Topics   Alcohol use: Not Currently    Comment: past   Drug use: Never     Family Hx: The patient's family history includes Anxiety disorder in his sister.  ROS:   Please see the history of present illness.    Review of Systems  Constitutional: Negative.   HENT: Negative.    Respiratory: Negative.    Cardiovascular: Negative.   Gastrointestinal: Negative.   Musculoskeletal: Negative.   Neurological:  Positive for dizziness and headaches.  Psychiatric/Behavioral: Negative.    All other systems reviewed and are negative.  Labs/Other Tests and Data Reviewed:    Recent Labs: 12/24/2020: ALT 18; TSH 1.81 04/14/2021: BUN 15; Creatinine, Ser 0.91; Hemoglobin 13.1; Platelets 219; Potassium 3.7; Sodium 139   Recent Lipid Panel Lab Results  Component Value Date/Time   CHOL 130 12/24/2020 08:28 AM   TRIG 152 (H) 12/24/2020 08:28 AM   HDL 36 (L) 12/24/2020 08:28 AM   CHOLHDL 3.6 12/24/2020 08:28 AM   LDLCALC 71 12/24/2020 08:28 AM    Wt Readings from Last 3 Encounters:  08/14/21 242 lb 8 oz (110 kg)  07/03/21 243 lb 12.8 oz (110.6 kg)  04/14/21 244 lb 0.8 oz (110.7 kg)     Exam:    Vital Signs: Vital signs may also be detailed in the HPI BP 110/70 (BP Location: Left Arm, Patient Position: Sitting, Cuff Size: Large)   Pulse 63   Ht 5\' 8"  (1.727 m)   Wt 242 lb 8 oz (110 kg)   SpO2 93%   BMI 36.87 kg/m    Constitutional:  oriented to person, place, and time. No distress.  HENT:  Head: Grossly normal Eyes:  no discharge. No scleral icterus.  Neck: No JVD, no carotid bruits  Cardiovascular: Regular rate and rhythm, no murmurs appreciated Pulmonary/Chest: Clear to auscultation bilaterally, no wheezes or rails Abdominal: Soft.  no distension.  no tenderness.  Musculoskeletal: Normal range of motion Neurological:  normal muscle tone. Coordination normal. No atrophy Skin: Skin warm and  dry Psychiatric: normal affect, pleasant  ASSESSMENT & PLAN:    Atherosclerosis of native coronary artery of native heart with stable angina pectoris (HCC) On aspirin Plavix (in light of prior strokes) Currently with no symptoms of angina. No further workup at this time. Continue current medication regimen.  Hx of CABG No new anginal symptoms, cholesterol at goal  Mixed hyperlipidemia Cholesterol is at goal on the current lipid regimen. No changes to the medications were made.  Benign essential HTN Hypotensive today Blood pressure low, positive orthostatics today with symptoms of dizziness, headaches at home Recommended he stop his isosorbide Stay on his other medications Monitor orthostatics at home and call if numbers do not improve  History of cerebrovascular accident (CVA) with residual deficit Recommended regular walking program for conditioning, weight loss    Total encounter time more than 30 minutes  Greater than 50% was spent in counseling and coordination of care with the patient   Signed, [place], MD  08/14/2021 4:32 PM    [facility] [address], [place], [place]

## 2021-08-14 ENCOUNTER — Ambulatory Visit (INDEPENDENT_AMBULATORY_CARE_PROVIDER_SITE_OTHER): Payer: Medicare PPO | Admitting: Cardiovascular Disease

## 2021-08-14 ENCOUNTER — Encounter: Payer: Self-pay | Admitting: Cardiovascular Disease

## 2021-08-14 VITALS — BP 110/70 | HR 63 | Ht 68.0 in | Wt 242.5 lb

## 2021-08-14 DIAGNOSIS — Z951 Presence of aortocoronary bypass graft: Secondary | ICD-10-CM | POA: Diagnosis not present

## 2021-08-14 DIAGNOSIS — I25118 Atherosclerotic heart disease of native coronary artery with other forms of angina pectoris: Secondary | ICD-10-CM | POA: Diagnosis not present

## 2021-08-14 DIAGNOSIS — I63412 Cerebral infarction due to embolism of left middle cerebral artery: Secondary | ICD-10-CM | POA: Diagnosis not present

## 2021-08-14 DIAGNOSIS — E782 Mixed hyperlipidemia: Secondary | ICD-10-CM | POA: Diagnosis not present

## 2021-08-14 DIAGNOSIS — I10 Essential (primary) hypertension: Secondary | ICD-10-CM | POA: Diagnosis not present

## 2021-08-14 DIAGNOSIS — G459 Transient cerebral ischemic attack, unspecified: Secondary | ICD-10-CM | POA: Diagnosis not present

## 2021-08-14 NOTE — Patient Instructions (Addendum)
Monitor blood pressure sitting and standing   Medication Instructions:  Please hold the isosorbide/imdur  If you need a refill on your cardiac medications before your next appointment, please call your pharmacy.   Lab work: No new labs needed  Testing/Procedures: No new testing needed  Follow-Up: At [facility], you and your health needs are our priority.  As part of our continuing mission to provide you with exceptional heart care, we have created designated Provider Care Teams.  These Care Teams include your primary Cardiologist (physician) and Advanced Practice Providers (APPs -  Physician Assistants and Nurse Practitioners) who all work together to provide you with the care you need, when you need it.  You will need a follow up appointment in 6 months  Providers on your designated Care Team:   Nicolasa Ducking, NP Eula Listen, PA-C Cadence Fransico Michael, [place]  COVID-19 Vaccine Information can be found at: [email or web address] For questions related to vaccine distribution or appointments, please email [email or web address] or call [phone].

## 2021-08-17 ENCOUNTER — Ambulatory Visit: Payer: Medicare PPO | Admitting: Pharmacist

## 2021-08-17 DIAGNOSIS — J309 Allergic rhinitis, unspecified: Secondary | ICD-10-CM

## 2021-08-17 DIAGNOSIS — I10 Essential (primary) hypertension: Secondary | ICD-10-CM

## 2021-08-17 NOTE — Chronic Care Management (AMB) (Signed)
Chronic Care Management CCM Pharmacy Note  08/17/2021 Name:  Luke Mckee MRN:  [id number] DOB:  [birth year]   Subjective: Luke Mckee is an 81 y.o. year old male who is a primary patient of [name], DO.  The CCM team was consulted for assistance with disease management and care coordination needs.    Engaged with patient by telephone for follow up visit for pharmacy case management and/or care coordination services.   Objective:  Medications Reviewed Today     Reviewed by Manuela Neptune, RPH-CPP (Pharmacist) on 08/17/21 at 1518  Med List Status: <None>   Medication Order Taking? Sig Documenting Provider Last Dose Status Informant  acetaminophen (TYLENOL) 650 MG CR tablet [id number]  Take 1,300 mg by mouth at bedtime as needed. [provider]  Active Spouse/Significant Other  albuterol (VENTOLIN HFA) 108 (90 Base) MCG/ACT inhaler [id number]  Inhale 1-2 puffs into the lungs every 4 (four) hours as needed for wheezing or shortness of breath (cough). [name], DO  Active   aspirin 81 MG chewable tablet [id number] Yes Chew 81 mg by mouth daily. [provider] Taking Active   atenolol (TENORMIN) 50 MG tablet [id number] Yes TAKE 1 TABLET(50 MG) BY MOUTH AT BEDTIME  Patient taking differently: Take 50 mg by mouth daily.   [name], DO Taking Active   atorvastatin (LIPITOR) 80 MG tablet [id number]  TAKE 1 TABLET(80 MG) BY MOUTH DAILY Karamalegos, Netta [name], DO  Active   Cholecalciferol (VITAMIN D) 50 MCG (2000 UT) tablet [id number]  Take 4,000 Units by mouth daily. [provider]  Active Spouse/Significant Other  clopidogrel (PLAVIX) 75 MG tablet [id number] Yes Take 1 tablet (75 mg total) by mouth daily. [name], DO Taking Active   clotrimazole-betamethasone (LOTRISONE) cream [id number]  Apply twice a day for 1-2 weeks, may repeat if need in future [name], DO  Active   co-enzyme Q-10  30 MG capsule [id number]  Take 100 mg by mouth daily. [provider]  Active   diclofenac sodium (VOLTAREN) 1 % GEL [id number]  Apply 2 g topically 4 (four) times daily as needed for pain. Foot pain [provider]  Active Spouse/Significant Other  docusate sodium (COLACE) 100 MG capsule [id number]  Take 200 mg by mouth daily. [provider]  Active Spouse/Significant Other  furosemide (LASIX) 80 MG tablet [id number] Yes Take 1 tablet (80 mg total) by mouth 2 (two) times daily. [name], DO Taking Active   ipratropium (ATROVENT) 0.06 % nasal spray [id number] Yes Place 2 sprays into both nostrils 4 (four) times daily as needed for rhinitis. [name], DO Taking Active   levothyroxine (SYNTHROID) 125 MCG tablet [id number]  TAKE 1 TABLET(125 MCG) BY MOUTH DAILY BEFORE BREAKFAST Karamalegos, Alexander Shela Commons, DO  Active   loratadine (CLARITIN) 10 MG tablet [id number] Yes Take 10 mg by mouth daily. [provider] Taking Active   LORazepam (ATIVAN) 0.5 MG tablet [id number]  Take 1 tablet (0.5 mg total) by mouth daily as needed for anxiety or sleep. Karamalegos, Netta [name], DO  Active   montelukast (SINGULAIR) 10 MG tablet [id number] Yes Take 1 tablet (10 mg total) by mouth at bedtime. [name], DO Taking Active   Multiple Vitamin (MULTIVITAMIN) tablet [id number]  Take 1 tablet by mouth daily. [provider]  Active Spouse/Significant Other  nitroGLYCERIN (NITROSTAT) 0.4 MG SL tablet [id number]  Place 1 tablet (0.4 mg total) under the tongue every 5 (five) minutes as  needed for chest pain. Karamalegos, Devonne Doughty, DO  Active   PATADAY 0.1 % ophthalmic solution [id number]  Place into both eyes as needed.  [provider]  Active   senna (SENOKOT) 8.6 MG tablet [id number]  Take 1 tablet by mouth daily. [provider]  Active   sertraline (ZOLOFT) 100 MG tablet [id number]  Take 2 tablets (200 mg total) by mouth  daily. Olin Hauser, DO  Active   spironolactone (ALDACTONE) 25 MG tablet [id number] Yes Take 0.5 tablets (12.5 mg total) by mouth daily. Olin Hauser, DO Taking Active   traZODone (DESYREL) 50 MG tablet [id number]  TAKE 1 TABLET(50 MG) BY MOUTH AT BEDTIME Olin Hauser, DO  Active             Pertinent Labs:   Lab Results  Component Value Date   CHOL 130 12/24/2020   HDL 36 (L) 12/24/2020   LDLCALC 71 12/24/2020   TRIG 152 (H) 12/24/2020   CHOLHDL 3.6 12/24/2020   Lab Results  Component Value Date   CREATININE 0.91 04/14/2021   BUN 15 04/14/2021   NA 139 04/14/2021   K 3.7 04/14/2021   CL 103 04/14/2021   CO2 26 04/14/2021   BP Readings from Last 3 Encounters:  08/14/21 110/70  07/03/21 113/67  04/14/21 125/75   Pulse Readings from Last 3 Encounters:  08/14/21 63  07/03/21 70  04/14/21 62     SDOH:  (Social Determinants of Health) assessments and interventions performed:    [place]  Review of patient past medical history, allergies, medications, health status, including review of consultants reports, laboratory and other test data, was performed as part of comprehensive evaluation and provision of chronic care management services.   Care Plan : PharmD - Med Management  Updates made by Rennis Petty, RPH-CPP since 08/17/2021 12:00 AM     Problem: Disease Progression      Long-Range Goal: Disease Progression Prevented or Minimized   Start Date: 02/04/2020  Expected End Date: 05/04/2020  Recent Progress: On track  Priority: High  Note:   Current Barriers:  Chronic Disease Management support, education, and care coordination needs related to CAD, HTN, HLD, and hx CVA  Pharmacist Clinical Goal(s):  Over the next 90 days, patient will adhere to plan to optimize therapeutic regimen for hypothyroidism as evidenced by report of adherence to recommended medication management changes through collaboration with PharmD and  provider.    Interventions: 1:1 collaboration with Olin Hauser, DO regarding development and update of comprehensive plan of care as evidenced by provider attestation and co-signature Inter-disciplinary care team collaboration (see longitudinal plan of care) Perform chart review. Office Visit with [facility] on 08/14/2021. Provider advised patient: Recommended he stop his isosorbide due to hypotensive symptoms Monitor orthostatics at home and call us if numbers do not improve Recommended regular walking program for conditioning, weight loss  Hypertension Controlled; current treatment: Atenolol 50 mg daily in the morning Furosemide 80 mg twice daily Spironolactone 25 mg - 1/2 tablet (12.5 mg) once daily Reports feeling much better since stopped isosorbide as directed by Cardiologist. Reports dizziness resolved Reports recent home readings: 8/4 at bedtime: Sitting: 129/63, HR 68 Standing: 141/81, HR 72 8/5 at bedtime: Sitting: 136/75, HR 71 8/6 at bedtime: Sitting 135/69, HR 76 8/7 at 2:20pm Sitting: 144/61, HR 63 Denies symptoms of hypotension Encourage patient to follow up about Silver Sneakers for walking program as recommended by Cardiology Encourage to monitor home BP  as directed by Cardiologist, keep log of results and call providers for readings outside of established parameters or symptoms  Seasonal allergies: Reports allergies currently controlled Current treatment: Loratadine 10 mg daily Montelukast 10 mg QHS Reports picked up and planning to restart Ipratropium nasal spray 2 sprays into both nostrils 4 times daily as needed for rhinitis as prescribed   Patient Goals/Self-Care Activities Over the next 90 days, patient will:  - take medications as prescribed with assistance of wife   Note: patient uses weekly pillbox as filled by wife - check blood pressure, document, and provide at future appointments  Follow Up Plan: Telephone follow up  appointment with care management team member scheduled for: 11/16/2021 at 1 pm       Estelle Grumbles, PharmD, [place], CPP Clinical Pharmacist [facility] [phone]

## 2021-08-17 NOTE — Patient Instructions (Signed)
Visit Information  Thank you for taking time to visit with me today. Please don't hesitate to contact me if I can be of assistance to you before our next scheduled telephone appointment.  Following are the goals we discussed today:   Goals Addressed             This Visit's Progress    Pharmacy Goals       It was great talking with you today!  Please check your blood pressure 1-2 times/week at home and keep a record to bring to medical appointments  Our goal bad cholesterol, or LDL, is less than 70 . This is why it is important to continue taking your atorvastatin  Feel free to call me with any questions or concerns. I look forward to our next call!   Estelle Grumbles, PharmD, BCACP Clinical Pharmacist [facility] [phone]           Our next appointment is by telephone on 11/16/2021 at 1 pm  Please call the care guide team at [phone] if you need to cancel or reschedule your appointment.    Patient verbalizes understanding of instructions and care plan provided today and agrees to view in MyChart. Active MyChart status and patient understanding of how to access instructions and care plan via MyChart confirmed with patient.

## 2021-08-19 LAB — CUP PACEART REMOTE DEVICE CHECK
Date Time Interrogation Session: 20230805232335
Implantable Pulse Generator Implant Date: 20200714

## 2021-08-19 NOTE — Telephone Encounter (Signed)
I left a message for the patient to call them directly, per what I was told at [facility]. I'll give him a few days to let me know as I asked him to do on the message, otherwise I'll try him again.

## 2021-08-19 NOTE — Progress Notes (Signed)
Carelink Summary Report / Loop Recorder 

## 2021-08-24 ENCOUNTER — Ambulatory Visit: Payer: Self-pay

## 2021-08-24 NOTE — Patient Instructions (Signed)
Visit Information  Thank you for taking time to visit with me today. Please don't hesitate to contact me if I can be of assistance to you.   Following are the goals we discussed today:   Goals Addressed             This Visit's Progress    RNCM: Effective Management of HTN       Care Coordination Interventions: BP Readings from Last 3 Encounters:  08/14/21 110/70  07/03/21 113/67  04/14/21 125/75    Evaluation of current treatment plan related to hypertension self management and patient's adherence to plan as established by provider. 08-24-2021: The patient has had changes in his medications and his blood pressures are more stable. The patients wife states he feels better and is more active since being taken off of Isosorbide by the cardiologist.  Provided education to patient re: stroke prevention, s/s of heart attack and stroke Reviewed medications with patient and discussed importance of compliance. 08-24-2021: The patient is compliant with medications. Denies any acute findings. Also works with pharm D.  Counseled on adverse effects of illicit drug and excessive alcohol use in patients with high blood pressure  Counseled on the importance of exercise goals with target of 150 minutes per week Discussed plans with patient for ongoing care management follow up and provided patient with direct contact information for care management team Advised patient, providing education and rationale, to monitor blood pressure daily and record, calling PCP for findings outside established parameters Reviewed scheduled/upcoming provider appointments including: 09-25-2021 with pcp and AWV Advised patient to discuss changes in heart health or Chronic diseases with provider Provided education on prescribed diet Heart healthy diet Discussed complications of poorly controlled blood pressure such as heart disease, stroke, circulatory complications, vision complications, kidney impairment, sexual  dysfunction Screening for signs and symptoms of depression related to chronic disease state  Assessed social determinant of health barriers AWV scheduled for 09-25-2021           Our next appointment is by telephone on 09-07-2021 at 230 pm   Please call the care guide team at [phone] if you need to cancel or reschedule your appointment.   If you are experiencing a Mental Health or Behavioral Health Crisis or need someone to talk to, please call the Suicide and Crisis Lifeline: 988 call the [place] National Suicide Prevention Lifeline: 4586869316 or TTY: 559-282-4269 TTY (989)862-1298) to talk to a trained counselor call 1-800-273-TALK (toll free, 24 hour hotline)  Patient verbalizes understanding of instructions and care plan provided today and agrees to view in MyChart. Active MyChart status and patient understanding of how to access instructions and care plan via MyChart confirmed with patient.     Telephone follow up appointment with care management team member scheduled for: 09-07-2021 at 230 pm  Alto Denver RN, MSN, CCM [facility] Coordinator [facility]  [facility] Mobile: [phone]

## 2021-08-24 NOTE — Patient Outreach (Signed)
  Care Coordination   Follow Up Visit Note   08/24/2021 Name: Luke Mckee MRN: [id number] DOB: [birth year]  Luke Mckee is a 81 y.o. year old male who sees [name], DO for primary care. I  Spoke with the patients wife, Luke Mckee today  What matters to the patients health and wellness today?  HTN controlled and changes in medications    Goals Addressed             This Visit's Progress    RNCM: Effective Management of HTN       Care Coordination Interventions: BP Readings from Last 3 Encounters:  08/14/21 110/70  07/03/21 113/67  04/14/21 125/75    Evaluation of current treatment plan related to hypertension self management and patient's adherence to plan as established by provider. 08-24-2021: The patient has had changes in his medications and his blood pressures are more stable. The patients wife states he feels better and is more active since being taken off of Isosorbide by the cardiologist.  Provided education to patient re: stroke prevention, s/s of heart attack and stroke Reviewed medications with patient and discussed importance of compliance. 08-24-2021: The patient is compliant with medications. Denies any acute findings. Also works with pharm D.  Counseled on adverse effects of illicit drug and excessive alcohol use in patients with high blood pressure  Counseled on the importance of exercise goals with target of 150 minutes per week Discussed plans with patient for ongoing care management follow up and provided patient with direct contact information for care management team Advised patient, providing education and rationale, to monitor blood pressure daily and record, calling PCP for findings outside established parameters Reviewed scheduled/upcoming provider appointments including: 09-25-2021 with pcp and AWV Advised patient to discuss changes in heart health or Chronic diseases with provider Provided education on prescribed diet Heart healthy diet Discussed  complications of poorly controlled blood pressure such as heart disease, stroke, circulatory complications, vision complications, kidney impairment, sexual dysfunction Screening for signs and symptoms of depression related to chronic disease state  Assessed social determinant of health barriers AWV scheduled for 09-25-2021           SDOH assessments and interventions completed:  Yes  SDOH Interventions Today    Flowsheet Row Most Recent Value  SDOH Interventions   Food Insecurity Interventions Intervention Not Indicated  Housing Interventions Intervention Not Indicated  Social Connections Interventions Intervention Not Indicated  Transportation Interventions Intervention Not Indicated        Care Coordination Interventions Activated:  Yes  Care Coordination Interventions:  Yes, provided   Follow up plan: Follow up call scheduled for 09-07-2021 at 230 pm    Encounter Outcome:  Pt. Visit Completed   Alto Denver RN, MSN, CCM Community Care Coordinator [facility]  [facility] Mobile: [phone]

## 2021-08-27 ENCOUNTER — Telehealth: Payer: Medicare PPO

## 2021-08-27 DIAGNOSIS — F411 Generalized anxiety disorder: Secondary | ICD-10-CM | POA: Diagnosis not present

## 2021-08-27 DIAGNOSIS — Z8673 Personal history of transient ischemic attack (TIA), and cerebral infarction without residual deficits: Secondary | ICD-10-CM | POA: Diagnosis not present

## 2021-08-27 DIAGNOSIS — R5383 Other fatigue: Secondary | ICD-10-CM | POA: Diagnosis not present

## 2021-08-28 ENCOUNTER — Ambulatory Visit: Payer: Medicare PPO

## 2021-09-07 ENCOUNTER — Ambulatory Visit: Payer: Self-pay

## 2021-09-07 NOTE — Patient Outreach (Signed)
Care Coordination   Follow Up Visit Note   09/07/2021 Name: Luke Mckee MRN: [id number] DOB: Nov 05, 1940  Luke Mckee is a 81 y.o. year old male who sees Luke Cords, DO for primary care. I spoke with  Luke Mckee by phone today.  What matters to the patients health and wellness today?  Blood pressure staying good and not falling    Goals Addressed             This Visit's Progress    RNCM: Effective Management of Falls and safety concerns       Care Coordination Interventions: Provided written and verbal education re: potential causes of falls and Fall prevention strategies Reviewed medications and discussed potential side effects of medications such as dizziness and frequent urination Advised patient of importance of notifying provider of falls. 09-07-2021: Review done with the patient. Assessed for signs and symptoms of orthostatic hypotension. 09-07-2021: The patients wife has been taking orthostatic VS. States since changes in medications his blood pressures are better.  Assessed for falls since last encounter. 09-07-2021: Per the patient and the patients wife the patient had a fall yesterday when he was getting bird seed and on his lawn mower. States he has some bruising under his right arm. Denies hitting his head or other injuries.  Assessed patients knowledge of fall risk prevention secondary to previously provided education. 09-07-2021: Education and support given  Provided patient information for fall alert systems Assessed working status of life alert bracelet and patient adherence Advised patient to discuss new falls, changes in conditions that could cause falls, new questions and concerns with provider        RNCM: Effective Management of HTN       Care Coordination Interventions: BP Readings from Last 3 Encounters:  08/14/21 110/70  07/03/21 113/67  04/14/21 125/75    Evaluation of current treatment plan related to hypertension self management and  patient's adherence to plan as established by provider. 08-24-2021: The patient has had changes in his medications and his blood pressures are more stable. The patients wife states he feels better and is more active since being taken off of Isosorbide by the cardiologist. 09-07-2021: The patient is continuing to do well since his medication changes. They have been taking orthostatic vital signs and is pressures are more stable. The patient fells better and is resting better.  Provided education to patient re: stroke prevention, s/s of heart attack and stroke Reviewed medications with patient and discussed importance of compliance. 09-07-2021: The patient is compliant with medications. Denies any acute findings. Also works with pharm D.  Counseled on adverse effects of illicit drug and excessive alcohol use in patients with high blood pressure  Counseled on the importance of exercise goals with target of 150 minutes per week Discussed plans with patient for ongoing care management follow up and provided patient with direct contact information for care management team Advised patient, providing education and rationale, to monitor blood pressure daily and record, calling PCP for findings outside established parameters Reviewed scheduled/upcoming provider appointments including: 09-25-2021 with pcp and AWV. Message sent to the scheduler to see if they needs blood work pre pcp visit.  Advised patient to discuss changes in heart health or Chronic diseases with provider Provided education on prescribed diet Heart healthy diet Discussed complications of poorly controlled blood pressure such as heart disease, stroke, circulatory complications, vision complications, kidney impairment, sexual dysfunction Screening for signs and symptoms of depression related to chronic disease state  Assessed  social determinant of health barriers AWV scheduled for 09-25-2021           SDOH assessments and interventions  completed:  Yes  SDOH Interventions Today    Flowsheet Row Most Recent Value  SDOH Interventions   Food Insecurity Interventions Intervention Not Indicated  Housing Interventions Intervention Not Indicated  Transportation Interventions Intervention Not Indicated        Care Coordination Interventions Activated:  Yes  Care Coordination Interventions:  Yes, provided   Follow up plan: Follow up call scheduled for 11-09-2021 at 130 pm    Encounter Outcome:  Pt. Visit Completed   Alto Denver RN, MSN, CCM Community Care Coordinator [facility]  [facility] Mobile: [phone]

## 2021-09-07 NOTE — Patient Instructions (Signed)
Visit Information  Thank you for taking time to visit with me today. Please don't hesitate to contact me if I can be of assistance to you.   Following are the goals we discussed today:   Goals Addressed             This Visit's Progress    RNCM: Effective Management of Falls and safety concerns       Care Coordination Interventions: Provided written and verbal education re: potential causes of falls and Fall prevention strategies Reviewed medications and discussed potential side effects of medications such as dizziness and frequent urination Advised patient of importance of notifying provider of falls. 09-07-2021: Review done with the patient. Assessed for signs and symptoms of orthostatic hypotension. 09-07-2021: The patients wife has been taking orthostatic VS. States since changes in medications his blood pressures are better.  Assessed for falls since last encounter. 09-07-2021: Per the patient and the patients wife the patient had a fall yesterday when he was getting bird seed and on his lawn mower. States he has some bruising under his right arm. Denies hitting his head or other injuries.  Assessed patients knowledge of fall risk prevention secondary to previously provided education. 09-07-2021: Education and support given  Provided patient information for fall alert systems Assessed working status of life alert bracelet and patient adherence Advised patient to discuss new falls, changes in conditions that could cause falls, new questions and concerns with provider        RNCM: Effective Management of HTN       Care Coordination Interventions: BP Readings from Last 3 Encounters:  08/14/21 110/70  07/03/21 113/67  04/14/21 125/75    Evaluation of current treatment plan related to hypertension self management and patient's adherence to plan as established by provider. 08-24-2021: The patient has had changes in his medications and his blood pressures are more stable. The patients wife  states he feels better and is more active since being taken off of Isosorbide by the cardiologist. 09-07-2021: The patient is continuing to do well since his medication changes. They have been taking orthostatic vital signs and is pressures are more stable. The patient fells better and is resting better.  Provided education to patient re: stroke prevention, s/s of heart attack and stroke Reviewed medications with patient and discussed importance of compliance. 09-07-2021: The patient is compliant with medications. Denies any acute findings. Also works with pharm D.  Counseled on adverse effects of illicit drug and excessive alcohol use in patients with high blood pressure  Counseled on the importance of exercise goals with target of 150 minutes per week Discussed plans with patient for ongoing care management follow up and provided patient with direct contact information for care management team Advised patient, providing education and rationale, to monitor blood pressure daily and record, calling PCP for findings outside established parameters Reviewed scheduled/upcoming provider appointments including: 09-25-2021 with pcp and AWV. Message sent to the scheduler to see if they needs blood work pre pcp visit.  Advised patient to discuss changes in heart health or Chronic diseases with provider Provided education on prescribed diet Heart healthy diet Discussed complications of poorly controlled blood pressure such as heart disease, stroke, circulatory complications, vision complications, kidney impairment, sexual dysfunction Screening for signs and symptoms of depression related to chronic disease state  Assessed social determinant of health barriers AWV scheduled for 09-25-2021           Our next appointment is by telephone on 11-09-2021 at 130  pm  Please call the care guide team at [phone] if you need to cancel or reschedule your appointment.   If you are experiencing a Mental Health or  Behavioral Health Crisis or need someone to talk to, please call the Suicide and Crisis Lifeline: 988 call the [place] National Suicide Prevention Lifeline: 909 466 6247 or TTY: 226-169-2006 TTY 407-524-3437) to talk to a trained counselor call 1-800-273-TALK (toll free, 24 hour hotline)  Patient verbalizes understanding of instructions and care plan provided today and agrees to view in MyChart. Active MyChart status and patient understanding of how to access instructions and care plan via MyChart confirmed with patient.     Telephone follow up appointment with care management team member scheduled for: 11-09-2021 at 130 pm  Alto Denver RN, MSN, CCM [facility] Coordinator [facility]  [facility] Mobile: [phone]

## 2021-09-07 NOTE — Patient Instructions (Signed)
Visit Information  Thank you for taking time to visit with me today. Please don't hesitate to contact me if I can be of assistance to you.   Following are the goals we discussed today:   Goals Addressed             This Visit's Progress    RNCM: Effective Management of Falls and safety concerns       Care Coordination Interventions: Provided written and verbal education re: potential causes of falls and Fall prevention strategies. 09-07-2021: New incoming call from the wife to let the RNCM know that she forgot to tell the RNCM that the patient would start PT in [place] starting on 09-09-2021.  This is with a company called [employer]. Information obtained. Will continue to monitor for new changes or concerns Reviewed medications and discussed potential side effects of medications such as dizziness and frequent urination Advised patient of importance of notifying provider of falls. 09-07-2021: Review done with the patient. Assessed for signs and symptoms of orthostatic hypotension. 09-07-2021: The patients wife has been taking orthostatic VS. States since changes in medications his blood pressures are better.  Assessed for falls since last encounter. 09-07-2021: Per the patient and the patients wife the patient had a fall yesterday when he was getting bird seed and on his lawn mower. States he has some bruising under his right arm. Denies hitting his head or other injuries.  Assessed patients knowledge of fall risk prevention secondary to previously provided education. 09-07-2021: Education and support given  Provided patient information for fall alert systems Assessed working status of life alert bracelet and patient adherence Advised patient to discuss new falls, changes in conditions that could cause falls, new questions and concerns with provider           Our next appointment is by telephone on 11-09-2021 at 130 pm  Please call the care guide team at [phone] if you need to cancel  or reschedule your appointment.   If you are experiencing a Mental Health or Behavioral Health Crisis or need someone to talk to, please call the Suicide and Crisis Lifeline: 988 call the [place] National Suicide Prevention Lifeline: 814-121-5204 or TTY: 616 358 4309 TTY 313-656-4283) to talk to a trained counselor call 1-800-273-TALK (toll free, 24 hour hotline)  Patient verbalizes understanding of instructions and care plan provided today and agrees to view in MyChart. Active MyChart status and patient understanding of how to access instructions and care plan via MyChart confirmed with patient.     Telephone follow up appointment with care management team member scheduled for: 11-09-2021 at 330 pm  Alto Denver RN, MSN, CCM [facility] Coordinator [facility]  [facility] Mobile: [phone]

## 2021-09-07 NOTE — Patient Outreach (Signed)
  Care Coordination   Follow Up Visit Note   09/07/2021 Name: Guhan Bruington MRN: [id number] DOB: [birth year]  Yuki Purves is a 81 y.o. year old male who sees [name], DO for primary care. I  spoke with the patients wife, Levante Simones who is also DRP from an incoming call to update the [facility] on information she forgot to tell the RNCM earlier.   What matters to the patients health and wellness today?  The wife wanted to call back and tell the RNCM that the patient was starting PT this week    Goals Addressed             This Visit's Progress    RNCM: Effective Management of Falls and safety concerns       Care Coordination Interventions: Provided written and verbal education re: potential causes of falls and Fall prevention strategies. 09-07-2021: New incoming call from the wife to let the RNCM know that she forgot to tell the RNCM that the patient would start PT in [place] starting on 09-09-2021.  This is with a company called [employer]. Information obtained. Will continue to monitor for new changes or concerns Reviewed medications and discussed potential side effects of medications such as dizziness and frequent urination Advised patient of importance of notifying provider of falls. 09-07-2021: Review done with the patient. Assessed for signs and symptoms of orthostatic hypotension. 09-07-2021: The patients wife has been taking orthostatic VS. States since changes in medications his blood pressures are better.  Assessed for falls since last encounter. 09-07-2021: Per the patient and the patients wife the patient had a fall yesterday when he was getting bird seed and on his lawn mower. States he has some bruising under his right arm. Denies hitting his head or other injuries.  Assessed patients knowledge of fall risk prevention secondary to previously provided education. 09-07-2021: Education and support given  Provided patient information for fall alert systems Assessed working status  of life alert bracelet and patient adherence Advised patient to discuss new falls, changes in conditions that could cause falls, new questions and concerns with provider           SDOH assessments and interventions completed:  No     Care Coordination Interventions Activated:  Yes  Care Coordination Interventions:  Yes, provided   Follow up plan: Follow up call scheduled for 11-09-2021 at 130 pm    Encounter Outcome:  Pt. Visit Completed   Alto Denver RN, MSN, CCM Community Care Coordinator [facility]  [facility] Mobile: [phone]

## 2021-09-09 DIAGNOSIS — R269 Unspecified abnormalities of gait and mobility: Secondary | ICD-10-CM | POA: Diagnosis not present

## 2021-09-09 DIAGNOSIS — M6281 Muscle weakness (generalized): Secondary | ICD-10-CM | POA: Diagnosis not present

## 2021-09-09 DIAGNOSIS — I639 Cerebral infarction, unspecified: Secondary | ICD-10-CM | POA: Diagnosis not present

## 2021-09-12 ENCOUNTER — Other Ambulatory Visit: Payer: Self-pay | Admitting: Family Medicine

## 2021-09-12 DIAGNOSIS — J3089 Other allergic rhinitis: Secondary | ICD-10-CM

## 2021-09-15 DIAGNOSIS — M6281 Muscle weakness (generalized): Secondary | ICD-10-CM | POA: Diagnosis not present

## 2021-09-15 DIAGNOSIS — I639 Cerebral infarction, unspecified: Secondary | ICD-10-CM | POA: Diagnosis not present

## 2021-09-15 DIAGNOSIS — R269 Unspecified abnormalities of gait and mobility: Secondary | ICD-10-CM | POA: Diagnosis not present

## 2021-09-15 NOTE — Telephone Encounter (Signed)
Refilled 07/03/21 # 90 with 3 refills. Requested Prescriptions  Refused Prescriptions Disp Refills  . montelukast (SINGULAIR) 10 MG tablet [Pharmacy Med Name: MONTELUKAST 10MG  TABLETS] 90 tablet 3    Sig: TAKE 1 TABLET(10 MG) BY MOUTH AT BEDTIME     Pulmonology:  Leukotriene Inhibitors Passed - 09/12/2021  6:14 AM      Passed - Valid encounter within last 12 months    Recent Outpatient Visits          2 months ago Pre-diabetes   [facility] [place], [place]   8 months ago Psychophysiologic insomnia   [facility], DO   11 months ago Anxiety   [facility], DO   1 year ago Benign essential HTN   [facility], DO   1 year ago Annual physical exam   [facility], DO      Future Appointments            In 1 week  [facility], [facility]   In 3 months [facility], [name], DO [facility], [facility]

## 2021-09-16 ENCOUNTER — Other Ambulatory Visit: Payer: Self-pay

## 2021-09-16 DIAGNOSIS — J3089 Other allergic rhinitis: Secondary | ICD-10-CM

## 2021-09-16 MED ORDER — MONTELUKAST SODIUM 10 MG PO TABS: 10.0000 mg | ORAL_TABLET | Freq: Every day | ORAL | 3 refills | Status: DC

## 2021-09-17 DIAGNOSIS — I639 Cerebral infarction, unspecified: Secondary | ICD-10-CM | POA: Diagnosis not present

## 2021-09-17 DIAGNOSIS — R269 Unspecified abnormalities of gait and mobility: Secondary | ICD-10-CM | POA: Diagnosis not present

## 2021-09-17 DIAGNOSIS — M6281 Muscle weakness (generalized): Secondary | ICD-10-CM | POA: Diagnosis not present

## 2021-09-21 DIAGNOSIS — I639 Cerebral infarction, unspecified: Secondary | ICD-10-CM | POA: Diagnosis not present

## 2021-09-21 DIAGNOSIS — M6281 Muscle weakness (generalized): Secondary | ICD-10-CM | POA: Diagnosis not present

## 2021-09-21 DIAGNOSIS — R269 Unspecified abnormalities of gait and mobility: Secondary | ICD-10-CM | POA: Diagnosis not present

## 2021-09-24 DIAGNOSIS — H903 Sensorineural hearing loss, bilateral: Secondary | ICD-10-CM | POA: Insufficient documentation

## 2021-09-24 DIAGNOSIS — E039 Hypothyroidism, unspecified: Secondary | ICD-10-CM | POA: Insufficient documentation

## 2021-09-24 DIAGNOSIS — I639 Cerebral infarction, unspecified: Secondary | ICD-10-CM | POA: Diagnosis not present

## 2021-09-24 DIAGNOSIS — E785 Hyperlipidemia, unspecified: Secondary | ICD-10-CM | POA: Insufficient documentation

## 2021-09-24 DIAGNOSIS — R269 Unspecified abnormalities of gait and mobility: Secondary | ICD-10-CM | POA: Diagnosis not present

## 2021-09-24 DIAGNOSIS — R7301 Impaired fasting glucose: Secondary | ICD-10-CM | POA: Insufficient documentation

## 2021-09-24 DIAGNOSIS — F32A Depression, unspecified: Secondary | ICD-10-CM | POA: Insufficient documentation

## 2021-09-24 DIAGNOSIS — G4730 Sleep apnea, unspecified: Secondary | ICD-10-CM | POA: Insufficient documentation

## 2021-09-24 DIAGNOSIS — M6281 Muscle weakness (generalized): Secondary | ICD-10-CM | POA: Diagnosis not present

## 2021-09-25 ENCOUNTER — Other Ambulatory Visit: Payer: Self-pay | Admitting: Family Medicine

## 2021-09-25 ENCOUNTER — Ambulatory Visit (INDEPENDENT_AMBULATORY_CARE_PROVIDER_SITE_OTHER): Payer: Medicare PPO

## 2021-09-25 VITALS — Wt 239.0 lb

## 2021-09-25 VITALS — BP 120/80 | Ht 68.0 in | Wt 239.4 lb

## 2021-09-25 DIAGNOSIS — Z23 Encounter for immunization: Secondary | ICD-10-CM | POA: Diagnosis not present

## 2021-09-25 DIAGNOSIS — Z0000 Encounter for general adult medical examination without abnormal findings: Secondary | ICD-10-CM

## 2021-09-25 NOTE — Progress Notes (Signed)
Closed encounter. Duplicate. He will have nurse visit Flu Shot  Saralyn Pilar, DO [facility] 09/25/2021, 3:33 PM

## 2021-09-25 NOTE — Progress Notes (Signed)
Subjective:   Hannah Crill is a 81 y.o. male who presents for Medicare Annual/Subsequent preventive examination.  Review of Systems     Cardiac Risk Factors include: advanced age (>86men, >36 women);hypertension;male gender;obesity (BMI >30kg/m2)     Objective:    Today's Vitals   09/25/21 1429  BP: 120/80  Weight: 239 lb 6.4 oz (108.6 kg)  Height: 5\' 8"  (1.727 m)   Body mass index is 36.4 kg/m.     09/25/2021    2:43 PM 04/14/2021   11:51 AM 01/19/2021   10:17 AM 10/08/2020   12:03 PM 07/17/2019   11:06 AM  Advanced Directives  Does Patient Have a Medical Advance Directive? No No No No No  Would patient like information on creating a medical advance directive? No - Patient declined No - Patient declined No - Patient declined      Current Medications (verified) Outpatient Encounter Medications as of 09/25/2021  Medication Sig   acetaminophen (TYLENOL) 650 MG CR tablet Take 1,300 mg by mouth at bedtime as needed.   albuterol (VENTOLIN HFA) 108 (90 Base) MCG/ACT inhaler Inhale 1-2 puffs into the lungs every 4 (four) hours as needed for wheezing or shortness of breath (cough).   aspirin EC 81 MG tablet Take by mouth.   atenolol (TENORMIN) 50 MG tablet TAKE 1 TABLET(50 MG) BY MOUTH AT BEDTIME (Patient taking differently: Take 50 mg by mouth daily.)   atorvastatin (LIPITOR) 40 MG tablet TAKE ONE-HALF TABLET BY MOUTH EVERY EVENING   atorvastatin (LIPITOR) 80 MG tablet TAKE 1 TABLET(80 MG) BY MOUTH DAILY   Cholecalciferol (VITAMIN D) 50 MCG (2000 UT) tablet Take 4,000 Units by mouth daily.   clopidogrel (PLAVIX) 75 MG tablet Take 1 tablet (75 mg total) by mouth daily.   clotrimazole-betamethasone (LOTRISONE) cream Apply twice a day for 1-2 weeks, may repeat if need in future   co-enzyme Q-10 30 MG capsule Take 100 mg by mouth daily.   diclofenac sodium (VOLTAREN) 1 % GEL Apply 2 g topically 4 (four) times daily as needed for pain. Foot pain   furosemide (LASIX) 80 MG tablet Take 1  tablet (80 mg total) by mouth 2 (two) times daily.   ipratropium (ATROVENT) 0.06 % nasal spray Place 2 sprays into both nostrils 4 (four) times daily as needed for rhinitis.   levothyroxine (SYNTHROID) 125 MCG tablet TAKE 1 TABLET(125 MCG) BY MOUTH DAILY BEFORE BREAKFAST   loratadine (CLARITIN) 10 MG tablet Take 10 mg by mouth daily.   LORazepam (ATIVAN) 0.5 MG tablet Take 1 tablet (0.5 mg total) by mouth daily as needed for anxiety or sleep.   losartan (COZAAR) 50 MG tablet Take 0.5 tablets by mouth daily.   montelukast (SINGULAIR) 10 MG tablet Take 1 tablet (10 mg total) by mouth at bedtime.   Multiple Vitamin (MULTIVITAMIN) tablet Take 1 tablet by mouth daily.   nitroGLYCERIN (NITROSTAT) 0.4 MG SL tablet Place 1 tablet (0.4 mg total) under the tongue every 5 (five) minutes as needed for chest pain.   PATADAY 0.1 % ophthalmic solution Place into both eyes as needed.    senna (SENOKOT) 8.6 MG tablet Take 1 tablet by mouth daily.   sertraline (ZOLOFT) 100 MG tablet Take 2 tablets (200 mg total) by mouth daily.   spironolactone (ALDACTONE) 25 MG tablet Take 0.5 tablets (12.5 mg total) by mouth daily.   traZODone (DESYREL) 50 MG tablet TAKE 1 TABLET(50 MG) BY MOUTH AT BEDTIME   [DISCONTINUED] acetaminophen (TYLENOL) 325 MG tablet TAKE  BY MOUTH  PRN   [DISCONTINUED] loratadine (CLARITIN) 10 MG tablet Take 1 tablet by mouth daily as needed.   [DISCONTINUED] sertraline (ZOLOFT) 100 MG tablet TAKE 1.5 TABLETS BY MOUTH EVERY DAY   docusate sodium (COLACE) 100 MG capsule Take 200 mg by mouth daily. (Patient not taking: Reported on 09/25/2021)   isosorbide mononitrate (IMDUR) 30 MG 24 hr tablet Take 1 tablet by mouth daily. (Patient not taking: Reported on 09/25/2021)   levothyroxine (SYNTHROID) 112 MCG tablet TAKE ONE TABLET BY MOUTH DAILY BEFORE BREAKFAST (Patient not taking: Reported on 09/25/2021)   meloxicam (MOBIC) 7.5 MG tablet Take 1 tablet by mouth daily. (Patient not taking: Reported on 09/25/2021)    [DISCONTINUED] aspirin 81 MG chewable tablet Chew 81 mg by mouth daily.   [DISCONTINUED] Coenzyme Q10 10 MG capsule Take by mouth.   No facility-administered encounter medications on file as of 09/25/2021.    Allergies (verified) Ace inhibitors, Cephalexin, Crestor [rosuvastatin calcium], and Tape   History: Past Medical History:  Diagnosis Date   Anxiety    Arthritis    CAD (coronary artery disease) 2005   s/p CABG, DES   CHF (congestive heart failure) (HCC)    in EPIC care everywhere   Chronic kidney disease    COPD (chronic obstructive pulmonary disease) (HCC)    in EPIC care everywhere   Depression    Heart murmur    History of stroke    Hypothyroidism    OSA (obstructive sleep apnea)    Paroxysmal A-fib (HCC)    in EPIC careeverywhere   Past Surgical History:  Procedure Laterality Date   APPENDECTOMY     BUNIONECTOMY     CATARACT EXTRACTION     Right eye   CATARACT EXTRACTION W/PHACO Left 01/19/2021   Procedure: CATARACT EXTRACTION PHACO AND INTRAOCULAR LENS PLACEMENT (IOC) LEFT 3.09 00:28.3;  Surgeon: Nevada Crane, MD;  Location: [facility];  Service: Ophthalmology;  Laterality: Left;   COLONOSCOPY  07/06/2004   CORONARY ARTERY BYPASS GRAFT  2005   LOOP RECORDER INSERTION N/A 07/25/2018   Procedure: LOOP RECORDER INSERTION;  Surgeon: Regan Lemming, MD;  Location: [facility];  Service: Cardiovascular;  Laterality: N/A;   TOTAL HIP ARTHROPLASTY Right 11/23/2011   TOTAL HIP ARTHROPLASTY Left 09/07/2011   TOTAL KNEE ARTHROPLASTY Right 02/15/2018   VASECTOMY  1978   Family History  Problem Relation Age of Onset   Anxiety disorder Sister    Social History   Socioeconomic History   Marital status: Married    Spouse name: Not on file   Number of children: Not on file   Years of education: Not on file   Highest education level: Not on file  Occupational History   Occupation: retired  Tobacco Use   Smoking status: Never    Smokeless tobacco: Never  Vaping Use   Vaping Use: Never used  Substance and Sexual Activity   Alcohol use: Not Currently    Comment: past   Drug use: Never   Sexual activity: Not on file  Other Topics Concern   Not on file  Social History Narrative   Not on file   Social Determinants of Health   Financial Resource Strain: Low Risk  (09/25/2021)   Overall Financial Resource Strain (CARDIA)    Difficulty of Paying Living Expenses: Not hard at all  Food Insecurity: No Food Insecurity (09/25/2021)   Hunger Vital Sign    Worried About Running Out of Food in the Last  Year: Never true    Ran Out of Food in the Last Year: Never true  Transportation Needs: No Transportation Needs (09/25/2021)   PRAPARE - Administrator, Civil ServiceTransportation    Lack of Transportation (Medical): No    Lack of Transportation (Non-Medical): No  Physical Activity: Insufficiently Active (09/25/2021)   Exercise Vital Sign    Days of Exercise per Week: 2 days    Minutes of Exercise per Session: 60 min  Stress: No Stress Concern Present (09/25/2021)   [employer] - Occupational Stress Questionnaire    Feeling of Stress : Not at all  Social Connections: Moderately Integrated (09/25/2021)   Social Connection and Isolation Panel [NHANES]    Frequency of Communication with Friends and Family: More than three times a week    Frequency of Social Gatherings with Friends and Family: More than three times a week    Attends Religious Services: More than 4 times per year    Active Member of [employer] Financial[employer]ubs or Organizations: No    Attends Engineer, structuralClub or Organization Meetings: Never    Marital Status: Married    Tobacco Counseling Counseling given: Not Answered   Clinical Intake:  Pre-visit preparation completed: Yes  Pain : No/denies pain     Nutritional Risks: None Diabetes: No  How often do you need to have someone help you when you read instructions, pamphlets, or other written materials from your doctor or  pharmacy?: 1 - Never  Diabetic?no  Interpreter Needed?: No  Information entered by :: Kennedy BuckerLorrie Alohilani Levenhagen, LPN   Activities of Daily Living    09/25/2021    2:44 PM 01/19/2021   10:12 AM  In your present state of health, do you have any difficulty performing the following activities:  Hearing? 0 0  Vision? 0 0  Difficulty concentrating or making decisions? 0 0  Walking or climbing stairs? 1 0  Dressing or bathing? 0 0  Doing errands, shopping? 1   Preparing Food and eating ? N   Using the Toilet? N   In the past six months, have you accidently leaked urine? N   Do you have problems with loss of bowel control? N   Managing your Medications? Y   Managing your Finances? N   Housekeeping or managing your Housekeeping? N     Patient Care Team: Smitty CordsKaramalegos, Alexander J, DO as PCP - General (Family Medicine) Ronney Asterselles, Jackelyn PolingElisabeth A, RPH-CPP as Pharmacist Arlana Pouchate, Megan MansPamela J, RN as Case Manager (General Practice)  Indicate any recent Medical Services you may have received from other than [facility] providers in the past year (date may be approximate).     Assessment:   This is a routine wellness examination for Ruperto.  Hearing/Vision screen Hearing Screening - Comments:: Wears aids Vision Screening - Comments:: Readers- Dr.Hagar  Dietary issues and exercise activities discussed: Current Exercise Habits: The patient does not participate in regular exercise at present, Type of exercise: Other - see comments (physical therapy), Time (Minutes): 60, Frequency (Times/Week): 2, Weekly Exercise (Minutes/Week): 120, Intensity: Mild   Goals Addressed             This Visit's Progress    DIET - EAT MORE FRUITS AND VEGETABLES         Depression Screen    09/25/2021    2:41 PM 07/03/2021    3:37 PM 10/16/2020    9:38 AM 07/29/2020    2:03 PM 07/28/2020    2:49 PM 04/16/2020   11:17 AM 10/17/2019    2:44 PM  PHQ 2/9 Scores  PHQ - 2 Score 1 0 0 0 0 0 0  PHQ- 9 Score 2 0 0   2 3    Fall Risk     09/25/2021    2:44 PM 07/03/2021    3:36 PM 10/16/2020    9:21 AM 07/29/2020    2:01 PM 07/17/2019   11:09 AM  Fall Risk   Falls in the past year? 1 0 1 1 1   Comment    tripped and lost balance losing balance  Number falls in past yr: 1 0 1 1 1   Injury with Fall? 1 0 1 0 0  Comment   Fell over reaching for something. Bruised left eye and arm    Risk for fall due to : History of fall(s) No Fall Risks  Impaired balance/gait;Medication side effect Impaired balance/gait;History of fall(s);Impaired mobility;Medication side effect  Follow up Falls evaluation completed;Falls prevention discussed Falls evaluation completed Falls evaluation completed Falls evaluation completed;Education provided;Falls prevention discussed Falls evaluation completed;Education provided;Falls prevention discussed    FALL RISK PREVENTION PERTAINING TO THE HOME:  Any stairs in or around the home? Yes  If so, are there any without handrails? No  Home free of loose throw rugs in walkways, pet beds, electrical cords, etc? Yes  Adequate lighting in your home to reduce risk of falls? Yes   ASSISTIVE DEVICES UTILIZED TO PREVENT FALLS:  Life alert? No  Use of a cane, walker or w/c? Yes  Grab bars in the bathroom? Yes  Shower chair or bench in shower? Yes  Elevated toilet seat or a handicapped toilet? No   TIMED UP AND GO:  Was the test performed? Yes .  Length of time to ambulate 10 feet: 4 sec.   Gait steady and fast without use of assistive device  Cognitive Function:        09/25/2021    2:53 PM 07/29/2020    2:06 PM 07/17/2019   11:14 AM  6CIT Screen  What Year? 0 points 0 points 0 points  What month? 0 points 0 points 0 points  What time? 0 points 0 points 0 points  Count back from 20 0 points 0 points 0 points  Months in reverse 0 points 0 points 0 points  Repeat phrase 2 points 4 points 4 points  Total Score 2 points 4 points 4 points    Immunizations Immunization History  Administered Date(s)  Administered   Fluad Quad(high Dose 65+) 10/16/2020   Hepatitis A, Adult 09/23/1996, 03/16/1997   Influenza Split 10/11/2016   Influenza Whole 10/27/1996, 11/02/1997, 12/24/1998, 10/31/1999, 11/13/2000   Influenza, High Dose Seasonal PF 09/05/2018   Influenza-Unspecified 09/25/2019   Moderna Covid-19 Vaccine Bivalent Booster 66yrs & up 10/20/2020   Moderna Sars-Covid-2 Vaccination 02/23/2019, 03/24/2019, 11/14/2019, 06/03/2020   OPV 02/11/1985   PPD Test 10/31/1999, 11/13/2000   Pneumococcal Conjugate-13 11/08/2014   Pneumococcal Polysaccharide-23 11/23/2000, 03/01/2008   Td 09/12/1994, 03/01/2008   Td (Adult), 2 Lf Tetanus Toxid, Preservative Free 09/12/1994   Tdap 10/08/2020   Typhoid Parenteral 09/23/1996   Typhoid Parenteral, AKD (10/10/2020 [employer]) 11/11/1993   Yellow Fever 01/12/1995   Zoster Recombinat (Shingrix) 06/25/2009, 07/24/2020    TDAP status: Up to date  Flu Vaccine status: Up to date  Pneumococcal vaccine status: Up to date  Covid-19 vaccine status: Completed vaccines  Qualifies for Shingles Vaccine? Yes   Zostavax completed No   Shingrix Completed?: Yes  Screening Tests Health Maintenance  Topic Date Due   COVID-19  Vaccine (6 - Moderna risk series) 12/15/2020   INFLUENZA VACCINE  08/11/2021   TETANUS/TDAP  10/09/2030   Pneumonia Vaccine 9+ Years old  Completed   Zoster Vaccines- Shingrix  Completed   HPV VACCINES  Aged Out    Health Maintenance  Health Maintenance Due  Topic Date Due   COVID-19 Vaccine (6 - Moderna risk series) 12/15/2020   INFLUENZA VACCINE  08/11/2021    Colorectal cancer screening: No longer required.   Lung Cancer Screening: (Low Dose CT Chest recommended if Age 76-80 years, 30 pack-year currently smoking OR have quit w/in 15years.) does not qualify.   Additional Screening:  Hepatitis C Screening: does not qualify; Completed no  Vision Screening: Recommended annual ophthalmology exams for early detection of glaucoma and  other disorders of the eye. Is the patient up to date with their annual eye exam?  Yes  Who is the provider or what is the name of the office in which the patient attends annual eye exams? Dr.Hagar If pt is not established with a provider, would they like to be referred to a provider to establish care? No .   Dental Screening: Recommended annual dental exams for proper oral hygiene  Community Resource Referral / Chronic Care Management: CRR required this visit?  No   CCM required this visit?  No      Plan:     I have personally reviewed and noted the following in the patient's chart:   Medical and social history Use of alcohol, tobacco or illicit drugs  Current medications and supplements including opioid prescriptions. Patient is not currently taking opioid prescriptions. Functional ability and status Nutritional status Physical activity Advanced directives List of other physicians Hospitalizations, surgeries, and ER visits in previous 12 months Vitals Screenings to include cognitive, depression, and falls Referrals and appointments  In addition, I have reviewed and discussed with patient certain preventive protocols, quality metrics, and best practice recommendations. A written personalized care plan for preventive services as well as general preventive health recommendations were provided to patient.     Hal Hope, LPN   [id number]   Nurse Notes: none

## 2021-09-25 NOTE — Patient Instructions (Signed)
Luke Mckee , Thank you for taking time to come for your Medicare Wellness Visit. I appreciate your ongoing commitment to your health goals. Please review the following plan we discussed and let me know if I can assist you in the future.   Screening recommendations/referrals: Colonoscopy: aged out Recommended yearly ophthalmology/optometry visit for glaucoma screening and checkup Recommended yearly dental visit for hygiene and checkup  Vaccinations: Influenza vaccine: 10/16/20 Pneumococcal vaccine: 11/08/14 Tdap vaccine: 10/08/20 Shingles vaccine: Shingrix 06/25/09, 07/24/20   Covid-19: 02/23/19, 03/24/19, 11/14/19, 06/03/20, 10/21/20  Advanced directives: no  Conditions/risks identified: none  Next appointment: Follow up in one year for your annual wellness visit. 10/08/22 @ 2:10pm in person  Preventive Care 65 Years and Older, Male Preventive care refers to lifestyle choices and visits with your health care provider that can promote health and wellness. What does preventive care include? A yearly physical exam. This is also called an annual well check. Dental exams once or twice a year. Routine eye exams. Ask your health care provider how often you should have your eyes checked. Personal lifestyle choices, including: Daily care of your teeth and gums. Regular physical activity. Eating a healthy diet. Avoiding tobacco and drug use. Limiting alcohol use. Practicing safe sex. Taking low doses of aspirin every day. Taking vitamin and mineral supplements as recommended by your health care provider. What happens during an annual well check? The services and screenings done by your health care provider during your annual well check will depend on your age, overall health, lifestyle risk factors, and family history of disease. Counseling  Your health care provider may ask you questions about your: Alcohol use. Tobacco use. Drug use. Emotional well-being. Home and relationship  well-being. Sexual activity. Eating habits. History of falls. Memory and ability to understand (cognition). Work and work Astronomer. Screening  You may have the following tests or measurements: Height, weight, and BMI. Blood pressure. Lipid and cholesterol levels. These may be checked every 5 years, or more frequently if you are over 40 years old. Skin check. Lung cancer screening. You may have this screening every year starting at age 60 if you have a 30-pack-year history of smoking and currently smoke or have quit within the past 15 years. Fecal occult blood test (FOBT) of the stool. You may have this test every year starting at age 92. Flexible sigmoidoscopy or colonoscopy. You may have a sigmoidoscopy every 5 years or a colonoscopy every 10 years starting at age 55. Prostate cancer screening. Recommendations will vary depending on your family history and other risks. Hepatitis C blood test. Hepatitis B blood test. Sexually transmitted disease (STD) testing. Diabetes screening. This is done by checking your blood sugar (glucose) after you have not eaten for a while (fasting). You may have this done every 1-3 years. Abdominal aortic aneurysm (AAA) screening. You may need this if you are a current or former smoker. Osteoporosis. You may be screened starting at age 46 if you are at high risk. Talk with your health care provider about your test results, treatment options, and if necessary, the need for more tests. Vaccines  Your health care provider may recommend certain vaccines, such as: Influenza vaccine. This is recommended every year. Tetanus, diphtheria, and acellular pertussis (Tdap, Td) vaccine. You may need a Td booster every 10 years. Zoster vaccine. You may need this after age 35. Pneumococcal 13-valent conjugate (PCV13) vaccine. One dose is recommended after age 11. Pneumococcal polysaccharide (PPSV23) vaccine. One dose is recommended after age 41. Talk  to your health care  provider about which screenings and vaccines you need and how often you need them. This information is not intended to replace advice given to you by your health care provider. Make sure you discuss any questions you have with your health care provider. Document Released: 01/24/2015 Document Revised: 09/17/2015 Document Reviewed: 10/29/2014 Elsevier Interactive Patient Education  2017 [place] Prevention in the Home Falls can cause injuries. They can happen to people of all ages. There are many things you can do to make your home safe and to help prevent falls. What can I do on the outside of my home? Regularly fix the edges of walkways and driveways and fix any cracks. Remove anything that might make you trip as you walk through a door, such as a raised step or threshold. Trim any bushes or trees on the path to your home. Use bright outdoor lighting. Clear any walking paths of anything that might make someone trip, such as rocks or tools. Regularly check to see if handrails are loose or broken. Make sure that both sides of any steps have handrails. Any raised decks and porches should have guardrails on the edges. Have any leaves, snow, or ice cleared regularly. Use sand or salt on walking paths during winter. Clean up any spills in your garage right away. This includes oil or grease spills. What can I do in the bathroom? Use night lights. Install grab bars by the toilet and in the tub and shower. Do not use towel bars as grab bars. Use non-skid mats or decals in the tub or shower. If you need to sit down in the shower, use a plastic, non-slip stool. Keep the floor dry. Clean up any water that spills on the floor as soon as it happens. Remove soap buildup in the tub or shower regularly. Attach bath mats securely with double-sided non-slip rug tape. Do not have throw rugs and other things on the floor that can make you trip. What can I do in the bedroom? Use night lights. Make  sure that you have a light by your bed that is easy to reach. Do not use any sheets or blankets that are too big for your bed. They should not hang down onto the floor. Have a firm chair that has side arms. You can use this for support while you get dressed. Do not have throw rugs and other things on the floor that can make you trip. What can I do in the kitchen? Clean up any spills right away. Avoid walking on wet floors. Keep items that you use a lot in easy-to-reach places. If you need to reach something above you, use a strong step stool that has a grab bar. Keep electrical cords out of the way. Do not use floor polish or wax that makes floors slippery. If you must use wax, use non-skid floor wax. Do not have throw rugs and other things on the floor that can make you trip. What can I do with my stairs? Do not leave any items on the stairs. Make sure that there are handrails on both sides of the stairs and use them. Fix handrails that are broken or loose. Make sure that handrails are as long as the stairways. Check any carpeting to make sure that it is firmly attached to the stairs. Fix any carpet that is loose or worn. Avoid having throw rugs at the top or bottom of the stairs. If you do have throw rugs,  attach them to the floor with carpet tape. Make sure that you have a light switch at the top of the stairs and the bottom of the stairs. If you do not have them, ask someone to add them for you. What else can I do to help prevent falls? Wear shoes that: Do not have high heels. Have rubber bottoms. Are comfortable and fit you well. Are closed at the toe. Do not wear sandals. If you use a stepladder: Make sure that it is fully opened. Do not climb a closed stepladder. Make sure that both sides of the stepladder are locked into place. Ask someone to hold it for you, if possible. Clearly mark and make sure that you can see: Any grab bars or handrails. First and last steps. Where the  edge of each step is. Use tools that help you move around (mobility aids) if they are needed. These include: Canes. Walkers. Scooters. Crutches. Turn on the lights when you go into a dark area. Replace any light bulbs as soon as they burn out. Set up your furniture so you have a clear path. Avoid moving your furniture around. If any of your floors are uneven, fix them. If there are any pets around you, be aware of where they are. Review your medicines with your doctor. Some medicines can make you feel dizzy. This can increase your chance of falling. Ask your doctor what other things that you can do to help prevent falls. This information is not intended to replace advice given to you by your health care provider. Make sure you discuss any questions you have with your health care provider. Document Released: 10/24/2008 Document Revised: 06/05/2015 Document Reviewed: 02/01/2014 Elsevier Interactive Patient Education  2017 [employer].

## 2021-09-28 ENCOUNTER — Ambulatory Visit: Payer: Medicare PPO | Admitting: Pharmacist

## 2021-09-28 ENCOUNTER — Ambulatory Visit (INDEPENDENT_AMBULATORY_CARE_PROVIDER_SITE_OTHER): Payer: Medicare PPO

## 2021-09-28 DIAGNOSIS — I10 Essential (primary) hypertension: Secondary | ICD-10-CM

## 2021-09-28 DIAGNOSIS — G459 Transient cerebral ischemic attack, unspecified: Secondary | ICD-10-CM | POA: Diagnosis not present

## 2021-09-28 NOTE — Progress Notes (Signed)
Subjective:   Kyndal Gloster is a 81 y.o. male who presents for Medicare Annual/Subsequent preventive examination.  Review of Systems     Cardiac Risk Factors include: advanced age (>64men, >71 women);hypertension;male gender;obesity (BMI >30kg/m2)     Objective:    Today's Vitals   09/25/21 1429  BP: 120/80  Weight: 239 lb 6.4 oz (108.6 kg)  Height: 5\' 8"  (1.727 m)   Body mass index is 36.4 kg/m.     09/25/2021    2:43 PM 04/14/2021   11:51 AM 01/19/2021   10:17 AM 10/08/2020   12:03 PM 07/17/2019   11:06 AM  Advanced Directives  Does Patient Have a Medical Advance Directive? No No No No No  Would patient like information on creating a medical advance directive? No - Patient declined No - Patient declined No - Patient declined      Current Medications (verified) Outpatient Encounter Medications as of 09/25/2021  Medication Sig   acetaminophen (TYLENOL) 650 MG CR tablet Take 1,300 mg by mouth at bedtime as needed.   albuterol (VENTOLIN HFA) 108 (90 Base) MCG/ACT inhaler Inhale 1-2 puffs into the lungs every 4 (four) hours as needed for wheezing or shortness of breath (cough).   aspirin EC 81 MG tablet Take 81 mg by mouth daily.   atenolol (TENORMIN) 50 MG tablet TAKE 1 TABLET(50 MG) BY MOUTH AT BEDTIME (Patient taking differently: Take 50 mg by mouth daily.)   atorvastatin (LIPITOR) 80 MG tablet TAKE 1 TABLET(80 MG) BY MOUTH DAILY   Cholecalciferol (VITAMIN D) 50 MCG (2000 UT) tablet Take 4,000 Units by mouth daily.   clopidogrel (PLAVIX) 75 MG tablet Take 1 tablet (75 mg total) by mouth daily.   clotrimazole-betamethasone (LOTRISONE) cream Apply twice a day for 1-2 weeks, may repeat if need in future   co-enzyme Q-10 30 MG capsule Take 100 mg by mouth daily.   diclofenac sodium (VOLTAREN) 1 % GEL Apply 2 g topically 4 (four) times daily as needed for pain. Foot pain   furosemide (LASIX) 80 MG tablet Take 1 tablet (80 mg total) by mouth 2 (two) times daily.   ipratropium  (ATROVENT) 0.06 % nasal spray Place 2 sprays into both nostrils 4 (four) times daily as needed for rhinitis.   levothyroxine (SYNTHROID) 125 MCG tablet TAKE 1 TABLET(125 MCG) BY MOUTH DAILY BEFORE BREAKFAST   loratadine (CLARITIN) 10 MG tablet Take 10 mg by mouth daily.   LORazepam (ATIVAN) 0.5 MG tablet Take 1 tablet (0.5 mg total) by mouth daily as needed for anxiety or sleep.   losartan (COZAAR) 50 MG tablet Take 0.5 tablets by mouth daily.   montelukast (SINGULAIR) 10 MG tablet Take 1 tablet (10 mg total) by mouth at bedtime.   Multiple Vitamin (MULTIVITAMIN) tablet Take 1 tablet by mouth daily.   nitroGLYCERIN (NITROSTAT) 0.4 MG SL tablet Place 1 tablet (0.4 mg total) under the tongue every 5 (five) minutes as needed for chest pain.   PATADAY 0.1 % ophthalmic solution Place into both eyes as needed.    sertraline (ZOLOFT) 100 MG tablet Take 2 tablets (200 mg total) by mouth daily.   spironolactone (ALDACTONE) 25 MG tablet Take 0.5 tablets (12.5 mg total) by mouth daily.   traZODone (DESYREL) 50 MG tablet TAKE 1 TABLET(50 MG) BY MOUTH AT BEDTIME   [DISCONTINUED] acetaminophen (TYLENOL) 325 MG tablet TAKE  BY MOUTH  PRN   [DISCONTINUED] atorvastatin (LIPITOR) 40 MG tablet TAKE ONE-HALF TABLET BY MOUTH EVERY EVENING   [DISCONTINUED] loratadine (  CLARITIN) 10 MG tablet Take 1 tablet by mouth daily as needed.   [DISCONTINUED] senna (SENOKOT) 8.6 MG tablet Take 1 tablet by mouth daily.   [DISCONTINUED] sertraline (ZOLOFT) 100 MG tablet TAKE 1.5 TABLETS BY MOUTH EVERY DAY   meloxicam (MOBIC) 7.5 MG tablet Take 1 tablet by mouth daily.   [DISCONTINUED] aspirin 81 MG chewable tablet Chew 81 mg by mouth daily.   [DISCONTINUED] Coenzyme Q10 10 MG capsule Take by mouth.   [DISCONTINUED] docusate sodium (COLACE) 100 MG capsule Take 200 mg by mouth daily.   [DISCONTINUED] isosorbide mononitrate (IMDUR) 30 MG 24 hr tablet Take 1 tablet by mouth daily. (Patient not taking: Reported on 09/25/2021)    [DISCONTINUED] levothyroxine (SYNTHROID) 112 MCG tablet    No facility-administered encounter medications on file as of 09/25/2021.    Allergies (verified) Ace inhibitors, Cephalexin, Crestor [rosuvastatin calcium], and Tape   History: Past Medical History:  Diagnosis Date   Anxiety    Arthritis    CAD (coronary artery disease) 2005   s/p CABG, DES   CHF (congestive heart failure) (HCC)    in EPIC care everywhere   Chronic kidney disease    COPD (chronic obstructive pulmonary disease) (HCC)    in EPIC care everywhere   Depression    Heart murmur    History of stroke    Hypothyroidism    OSA (obstructive sleep apnea)    Paroxysmal A-fib (HCC)    in EPIC careeverywhere   Past Surgical History:  Procedure Laterality Date   APPENDECTOMY     BUNIONECTOMY     CATARACT EXTRACTION     Right eye   CATARACT EXTRACTION W/PHACO Left 01/19/2021   Procedure: CATARACT EXTRACTION PHACO AND INTRAOCULAR LENS PLACEMENT (IOC) LEFT 3.09 00:28.3;  Surgeon: Nevada Crane, MD;  Location: [facility];  Service: Ophthalmology;  Laterality: Left;   COLONOSCOPY  07/06/2004   CORONARY ARTERY BYPASS GRAFT  2005   LOOP RECORDER INSERTION N/A 07/25/2018   Procedure: LOOP RECORDER INSERTION;  Surgeon: Regan Lemming, MD;  Location: [facility];  Service: Cardiovascular;  Laterality: N/A;   TOTAL HIP ARTHROPLASTY Right 11/23/2011   TOTAL HIP ARTHROPLASTY Left 09/07/2011   TOTAL KNEE ARTHROPLASTY Right 02/15/2018   VASECTOMY  1978   Family History  Problem Relation Age of Onset   Anxiety disorder Sister    Social History   Socioeconomic History   Marital status: Married    Spouse name: Not on file   Number of children: Not on file   Years of education: Not on file   Highest education level: Not on file  Occupational History   Occupation: retired  Tobacco Use   Smoking status: Never   Smokeless tobacco: Never  Vaping Use   Vaping Use: Never used  Substance and Sexual  Activity   Alcohol use: Not Currently    Comment: past   Drug use: Never   Sexual activity: Not on file  Other Topics Concern   Not on file  Social History Narrative   Not on file   Social Determinants of Health   Financial Resource Strain: Low Risk  (09/25/2021)   Overall Financial Resource Strain (CARDIA)    Difficulty of Paying Living Expenses: Not hard at all  Food Insecurity: No Food Insecurity (09/25/2021)   Hunger Vital Sign    Worried About Running Out of Food in the Last Year: Never true    Ran Out of Food in the Last Year: Never true  Transportation Needs: No Transportation Needs (09/25/2021)   PRAPARE - Administrator, Civil ServiceTransportation    Lack of Transportation (Medical): No    Lack of Transportation (Non-Medical): No  Physical Activity: Insufficiently Active (09/25/2021)   Exercise Vital Sign    Days of Exercise per Week: 2 days    Minutes of Exercise per Session: 60 min  Stress: No Stress Concern Present (09/25/2021)   [employer] - Occupational Stress Questionnaire    Feeling of Stress : Not at all  Social Connections: Moderately Integrated (09/25/2021)   Social Connection and Isolation Panel [NHANES]    Frequency of Communication with Friends and Family: More than three times a week    Frequency of Social Gatherings with Friends and Family: More than three times a week    Attends Religious Services: More than 4 times per year    Active Member of [employer]ubs or Organizations: No    Attends Engineer, structuralClub or Organization Meetings: Never    Marital Status: Married    Tobacco Counseling Counseling given: Not Answered   Clinical Intake:  Pre-visit preparation completed: Yes  Pain : No/denies pain     Nutritional Risks: None Diabetes: No  How often do you need to have someone help you when you read instructions, pamphlets, or other written materials from your doctor or pharmacy?: 1 - Never  Diabetic?no  Interpreter Needed?: No  Information entered by ::  Kennedy BuckerLorrie Kathlyn Leachman, LPN   Activities of Daily Living    09/25/2021    2:44 PM 01/19/2021   10:12 AM  In your present state of health, do you have any difficulty performing the following activities:  Hearing? 0 0  Vision? 0 0  Difficulty concentrating or making decisions? 0 0  Walking or climbing stairs? 1 0  Dressing or bathing? 0 0  Doing errands, shopping? 1   Preparing Food and eating ? N   Using the Toilet? N   In the past six months, have you accidently leaked urine? N   Do you have problems with loss of bowel control? N   Managing your Medications? Y   Managing your Finances? N   Housekeeping or managing your Housekeeping? N     Patient Care Team: Smitty CordsKaramalegos, Alexander J, DO as PCP - General (Family Medicine) Ronney Asterselles, Jackelyn PolingElisabeth A, RPH-CPP as Pharmacist Arlana Pouchate, Megan MansPamela J, RN as Case Manager (General Practice)  Indicate any recent Medical Services you may have received from other than [facility] providers in the past year (date may be approximate).     Assessment:   This is a routine wellness examination for Paddy.  Hearing/Vision screen Hearing Screening - Comments:: Wears aids Vision Screening - Comments:: Readers- Dr.Hagar  Dietary issues and exercise activities discussed: Current Exercise Habits: The patient does not participate in regular exercise at present, Type of exercise: Other - see comments (physical therapy), Time (Minutes): 60, Frequency (Times/Week): 2, Weekly Exercise (Minutes/Week): 120, Intensity: Mild   Goals Addressed             This Visit's Progress    DIET - EAT MORE FRUITS AND VEGETABLES        Depression Screen    09/25/2021    2:41 PM 07/03/2021    3:37 PM 10/16/2020    9:38 AM 07/29/2020    2:03 PM 07/28/2020    2:49 PM 04/16/2020   11:17 AM 10/17/2019    2:44 PM  PHQ 2/9 Scores  PHQ - 2 Score 1 0 0 0 0 0 0  PHQ-  9 Score 2 0 0   2 3    Fall Risk    09/25/2021    2:44 PM 07/03/2021    3:36 PM 10/16/2020    9:21 AM 07/29/2020    2:01 PM  07/17/2019   11:09 AM  Fall Risk   Falls in the past year? 1 0 1 1 1   Comment    tripped and lost balance losing balance  Number falls in past yr: 1 0 1 1 1   Injury with Fall? 1 0 1 0 0  Comment   Fell over reaching for something. Bruised left eye and arm    Risk for fall due to : History of fall(s) No Fall Risks  Impaired balance/gait;Medication side effect Impaired balance/gait;History of fall(s);Impaired mobility;Medication side effect  Follow up Falls evaluation completed;Falls prevention discussed Falls evaluation completed Falls evaluation completed Falls evaluation completed;Education provided;Falls prevention discussed Falls evaluation completed;Education provided;Falls prevention discussed    FALL RISK PREVENTION PERTAINING TO THE HOME:  Any stairs in or around the home? Yes  If so, are there any without handrails? No  Home free of loose throw rugs in walkways, pet beds, electrical cords, etc? Yes  Adequate lighting in your home to reduce risk of falls? Yes   ASSISTIVE DEVICES UTILIZED TO PREVENT FALLS:  Life alert? No  Use of a cane, walker or w/c? Yes  Grab bars in the bathroom? Yes  Shower chair or bench in shower? Yes  Elevated toilet seat or a handicapped toilet? No   TIMED UP AND GO:  Was the test performed? Yes .  Length of time to ambulate 10 feet: 4 sec.   Gait steady and fast without use of assistive device  Cognitive Function:        09/25/2021    2:53 PM 07/29/2020    2:06 PM 07/17/2019   11:14 AM  6CIT Screen  What Year? 0 points 0 points 0 points  What month? 0 points 0 points 0 points  What time? 0 points 0 points 0 points  Count back from 20 0 points 0 points 0 points  Months in reverse 0 points 0 points 0 points  Repeat phrase 2 points 4 points 4 points  Total Score 2 points 4 points 4 points    Immunizations Immunization History  Administered Date(s) Administered   Fluad Quad(high Dose 65+) 10/16/2020, 09/25/2021   Hepatitis A, Adult  09/23/1996, 03/16/1997   Influenza Split 10/11/2016   Influenza Whole 10/27/1996, 11/02/1997, 12/24/1998, 10/31/1999, 11/13/2000   Influenza, High Dose Seasonal PF 09/05/2018   Influenza-Unspecified 09/25/2019   Moderna Covid-19 Vaccine Bivalent Booster 8yrs & up 10/20/2020   Moderna Sars-Covid-2 Vaccination 02/23/2019, 03/24/2019, 11/14/2019, 06/03/2020   OPV 02/11/1985   PPD Test 10/31/1999, 11/13/2000   Pneumococcal Conjugate-13 11/08/2014   Pneumococcal Polysaccharide-23 11/23/2000, 03/01/2008   Td 09/12/1994, 03/01/2008   Td (Adult), 2 Lf Tetanus Toxid, Preservative Free 09/12/1994   Tdap 10/08/2020   Typhoid Parenteral 09/23/1996   Typhoid Parenteral, AKD (10/10/2020 Military) 11/11/1993   Yellow Fever 01/12/1995   Zoster Recombinat (Shingrix) 06/25/2009, 07/24/2020    TDAP status: Up to date  Flu Vaccine status: Up to date  Pneumococcal vaccine status: Up to date  Covid-19 vaccine status: Completed vaccines  Qualifies for Shingles Vaccine? Yes   Zostavax completed No   Shingrix Completed?: Yes  Screening Tests Health Maintenance  Topic Date Due   COVID-19 Vaccine (6 - Moderna risk series) 12/15/2020   TETANUS/TDAP  10/09/2030   Pneumonia Vaccine  46+ Years old  Completed   INFLUENZA VACCINE  Completed   Zoster Vaccines- Shingrix  Completed   HPV VACCINES  Aged Out    Health Maintenance  Health Maintenance Due  Topic Date Due   COVID-19 Vaccine (6 - Moderna risk series) 12/15/2020    Colorectal cancer screening: No longer required.   Lung Cancer Screening: (Low Dose CT Chest recommended if Age 87-80 years, 30 pack-year currently smoking OR have quit w/in 15years.) does not qualify.   Additional Screening:  Hepatitis C Screening: does not qualify; Completed no  Vision Screening: Recommended annual ophthalmology exams for early detection of glaucoma and other disorders of the eye. Is the patient up to date with their annual eye exam?  Yes  Who is the provider  or what is the name of the office in which the patient attends annual eye exams? Dr.Hagar If pt is not established with a provider, would they like to be referred to a provider to establish care? No .   Dental Screening: Recommended annual dental exams for proper oral hygiene  Community Resource Referral / Chronic Care Management: CRR required this visit?  No   CCM required this visit?  No      Plan:     I have personally reviewed and noted the following in the patient's chart:   Medical and social history Use of alcohol, tobacco or illicit drugs  Current medications and supplements including opioid prescriptions. Patient is not currently taking opioid prescriptions. Functional ability and status Nutritional status Physical activity Advanced directives List of other physicians Hospitalizations, surgeries, and ER visits in previous 12 months Vitals Screenings to include cognitive, depression, and falls Referrals and appointments  In addition, I have reviewed and discussed with patient certain preventive protocols, quality metrics, and best practice recommendations. A written personalized care plan for preventive services as well as general preventive health recommendations were provided to patient.     Hal Hope, LPN   1/61/0960   Nurse Notes: none

## 2021-09-28 NOTE — Patient Instructions (Signed)
Check your blood pressure once daily, and any time you have concerning symptoms like headache, chest pain, dizziness, shortness of breath, or vision changes.   To appropriately check your blood pressure, make sure you do the following:  1) Avoid caffeine, exercise, or tobacco products for 30 minutes before checking. Empty your bladder. 2) Sit with your back supported in a flat-backed chair. Rest your arm on something flat (arm of the chair, table, etc). 3) Sit still with your feet flat on the floor, resting, for at least 5 minutes.  4) Check your blood pressure. Take 1-2 readings.  5) Write down these readings and bring with you to any provider appointments.  Bring your home blood pressure machine with you to a provider's office for accuracy comparison at least once a year.   Make sure you take your blood pressure medications before you come to any office visit, even if you were asked to fast for labs.  Lilyona Richner Analiz Tvedt, PharmD, BCACP, CPP Clinical Pharmacist [facility] [phone]  

## 2021-09-28 NOTE — Chronic Care Management (AMB) (Signed)
Chief Complaint  Patient presents with   Medication Management    Luke Mckee is a 81 y.o. year old male who presented for a telephone visit.   They were referred to the pharmacist by their PCP for assistance in managing complex medication management.   Receive a phone call from patient's wife today requesting assistance as noted from reviewing latest After Visit Summary from 9/15 that patient's medication list was not up to date (including medications that patient is no longer taking)  Subjective:  Medication Access/Adherence  Current Pharmacy:  [facility], [address] [place] [address] Phone: [phone] Fax: [phone]  [facility] - [address], [address] [place] [address] Phone: [phone] Fax: [phone]   Patient reports affordability concerns with their medications: No  Patient reports access/transportation concerns to their pharmacy: No  Patient reports adherence concerns with their medications:  No      Health Maintenance  Health Maintenance Due  Topic Date Due   COVID-19 Vaccine (6 - Moderna risk series) 12/15/2020     Objective: Lab Results  Component Value Date   HGBA1C 6.1 (A) 07/03/2021    Lab Results  Component Value Date   CREATININE 0.91 04/14/2021   BUN 15 04/14/2021   NA 139 04/14/2021   K 3.7 04/14/2021   CL 103 04/14/2021   CO2 26 04/14/2021    Lab Results  Component Value Date   CHOL 130 12/24/2020   HDL 36 (L) 12/24/2020   LDLCALC 71 12/24/2020   TRIG 152 (H) 12/24/2020   CHOLHDL 3.6 12/24/2020    Medications Reviewed Today     Reviewed by Rennis Petty, RPH-CPP (Pharmacist) on 09/28/21 at 5  Med List Status: <None>   Medication Order Taking? Sig Documenting Provider Last Dose Status Informant  acetaminophen (TYLENOL) 650 MG CR tablet [id number] Yes Take  1,300 mg by mouth at bedtime as needed. [provider] Taking Active Spouse/Significant Other  albuterol (VENTOLIN HFA) 108 (90 Base) MCG/ACT inhaler [id number]  Inhale 1-2 puffs into the lungs every 4 (four) hours as needed for wheezing or shortness of breath (cough). Olin Hauser, DO  Active   aspirin EC 81 MG tablet [id number] Yes Take 81 mg by mouth daily. [provider] Taking Active   atenolol (TENORMIN) 50 MG tablet [id number] Yes TAKE 1 TABLET(50 MG) BY MOUTH AT BEDTIME  Patient taking differently: Take 50 mg by mouth daily.   Olin Hauser, DO Taking Active            Med Note Drema Dallas, [place] Jock   Fri Sep 25, 2021  2:36 PM) Takes in the a.m.  atorvastatin (LIPITOR) 80 MG tablet [id number] Yes TAKE 1 TABLET(80 MG) BY MOUTH DAILY Olin Hauser, DO Taking Active   Calcium Carb-Cholecalciferol (CALCIUM 600 + D PO) [id number] Yes Take 1 tablet by mouth daily. [provider] Taking Active   Cholecalciferol (VITAMIN D) 50 MCG (2000 UT) tablet [id number] Yes Take 4,000 Units by mouth daily. [provider] Taking Active Spouse/Significant Other  clopidogrel (PLAVIX) 75 MG tablet [id number] Yes Take 1 tablet (75 mg total) by mouth daily. Olin Hauser, DO Taking Active   clotrimazole-betamethasone (LOTRISONE) cream [id number]  Apply twice a day for 1-2 weeks, may repeat if need in future Olin Hauser,  DO  Active   co-enzyme Q-10 30 MG capsule [id number] Yes Take 100 mg by mouth daily. [provider] Taking Active   diclofenac sodium (VOLTAREN) 1 % GEL [id number] Yes Apply 2 g topically 4 (four) times daily as needed for pain. Foot pain [provider] Taking Active Spouse/Significant Other  furosemide (LASIX) 80 MG tablet [id number] Yes Take 1 tablet (80 mg total) by mouth 2 (two) times daily. Olin Hauser, DO Taking Active   ipratropium (ATROVENT) 0.06 % nasal spray [id number] Yes Place  2 sprays into both nostrils 4 (four) times daily as needed for rhinitis. Olin Hauser, DO Taking Active   levothyroxine (SYNTHROID) 125 MCG tablet [id number] Yes TAKE 1 TABLET(125 MCG) BY MOUTH DAILY BEFORE BREAKFAST Karamalegos, Devonne Doughty, DO Taking Active   loratadine (CLARITIN) 10 MG tablet [id number] Yes Take 10 mg by mouth daily. [provider] Taking Active   LORazepam (ATIVAN) 0.5 MG tablet [id number] Yes Take 1 tablet (0.5 mg total) by mouth daily as needed for anxiety or sleep. Olin Hauser, DO Taking Active   montelukast (SINGULAIR) 10 MG tablet [id number] Yes Take 1 tablet (10 mg total) by mouth at bedtime. Olin Hauser, DO Taking Active   Multiple Vitamin (MULTIVITAMIN) tablet [id number] Yes Take 1 tablet by mouth daily. [provider] Taking Active Spouse/Significant Other  nitroGLYCERIN (NITROSTAT) 0.4 MG SL tablet [id number]  Place 1 tablet (0.4 mg total) under the tongue every 5 (five) minutes as needed for chest pain. Karamalegos, Devonne Doughty, DO  Active   PATADAY 0.1 % ophthalmic solution [id number] Yes Place into both eyes as needed.  [provider] Taking Active   sennosides-docusate sodium (SENOKOT-S) 8.6-50 MG tablet [id number] Yes Take 1-2 tablets by mouth daily as needed for constipation. [provider] Taking Active   sertraline (ZOLOFT) 100 MG tablet [id number] Yes Take 2 tablets (200 mg total) by mouth daily. Olin Hauser, DO Taking Active   spironolactone (ALDACTONE) 25 MG tablet [id number] Yes Take 0.5 tablets (12.5 mg total) by mouth daily. Olin Hauser, DO Taking Active   traZODone (DESYREL) 50 MG tablet [id number] Yes TAKE 1 TABLET(50 MG) BY MOUTH AT BEDTIME Olin Hauser, DO Taking Active               Assessment/Plan:   Comprehensive medication review performed; medication list updated in electronic medical record  Hypertension: Current treatment: Atenolol  50 mg daily in the morning Furosemide 80 mg twice daily Spironolactone 25 mg - 1/2 tablet (12.5 mg) once daily Encourage to monitor home BP as directed by Cardiologist, keep log of results and call providers for readings outside of established parameters or symptoms   Follow Up Plan: CM Pharmacist will outreach to patient by telephone again on 11/8 at 1 pm  Wallace Cullens, PharmD, [place], [facility] [phone]

## 2021-09-30 ENCOUNTER — Other Ambulatory Visit: Payer: Self-pay | Admitting: Family Medicine

## 2021-09-30 DIAGNOSIS — I639 Cerebral infarction, unspecified: Secondary | ICD-10-CM | POA: Diagnosis not present

## 2021-09-30 DIAGNOSIS — R269 Unspecified abnormalities of gait and mobility: Secondary | ICD-10-CM | POA: Diagnosis not present

## 2021-09-30 DIAGNOSIS — I6930 Unspecified sequelae of cerebral infarction: Secondary | ICD-10-CM

## 2021-09-30 DIAGNOSIS — M6281 Muscle weakness (generalized): Secondary | ICD-10-CM | POA: Diagnosis not present

## 2021-09-30 DIAGNOSIS — I10 Essential (primary) hypertension: Secondary | ICD-10-CM

## 2021-09-30 LAB — CUP PACEART REMOTE DEVICE CHECK
Date Time Interrogation Session: 20230917230918
Implantable Pulse Generator Implant Date: 20200714

## 2021-10-02 DIAGNOSIS — R269 Unspecified abnormalities of gait and mobility: Secondary | ICD-10-CM | POA: Diagnosis not present

## 2021-10-02 DIAGNOSIS — I639 Cerebral infarction, unspecified: Secondary | ICD-10-CM | POA: Diagnosis not present

## 2021-10-02 DIAGNOSIS — M6281 Muscle weakness (generalized): Secondary | ICD-10-CM | POA: Diagnosis not present

## 2021-10-07 DIAGNOSIS — M6281 Muscle weakness (generalized): Secondary | ICD-10-CM | POA: Diagnosis not present

## 2021-10-07 DIAGNOSIS — R269 Unspecified abnormalities of gait and mobility: Secondary | ICD-10-CM | POA: Diagnosis not present

## 2021-10-07 DIAGNOSIS — I639 Cerebral infarction, unspecified: Secondary | ICD-10-CM | POA: Diagnosis not present

## 2021-10-09 DIAGNOSIS — R269 Unspecified abnormalities of gait and mobility: Secondary | ICD-10-CM | POA: Diagnosis not present

## 2021-10-09 DIAGNOSIS — M6281 Muscle weakness (generalized): Secondary | ICD-10-CM | POA: Diagnosis not present

## 2021-10-09 DIAGNOSIS — I639 Cerebral infarction, unspecified: Secondary | ICD-10-CM | POA: Diagnosis not present

## 2021-10-10 NOTE — Progress Notes (Signed)
Carelink Summary Report / Loop Recorder 

## 2021-10-12 ENCOUNTER — Telehealth: Payer: Self-pay

## 2021-10-12 NOTE — Telephone Encounter (Signed)
LINQ alert received.  Device reached RRT 10/1 - route to triage.  Attempted to contact patient to advise. No answer, LMTCB.  - Marked "I" in [employer]. -Discontinued from [place] future remotes.

## 2021-10-13 DIAGNOSIS — I639 Cerebral infarction, unspecified: Secondary | ICD-10-CM | POA: Diagnosis not present

## 2021-10-13 DIAGNOSIS — M6281 Muscle weakness (generalized): Secondary | ICD-10-CM | POA: Diagnosis not present

## 2021-10-13 DIAGNOSIS — R269 Unspecified abnormalities of gait and mobility: Secondary | ICD-10-CM | POA: Diagnosis not present

## 2021-10-13 NOTE — Telephone Encounter (Signed)
Outreach made to Pt.  Advised loop RRT.  Can leave it in or schedule an office visit to have it removed.  Pt will consider his options and call this nurse back if he would like it removed.  Pt lives in [place] schedule removal with [place] EP doc if he does want it removed.  Sending return kit for loop equipment.  Await further needs.

## 2021-10-15 DIAGNOSIS — R269 Unspecified abnormalities of gait and mobility: Secondary | ICD-10-CM | POA: Diagnosis not present

## 2021-10-15 DIAGNOSIS — M6281 Muscle weakness (generalized): Secondary | ICD-10-CM | POA: Diagnosis not present

## 2021-10-15 DIAGNOSIS — I639 Cerebral infarction, unspecified: Secondary | ICD-10-CM | POA: Diagnosis not present

## 2021-10-23 ENCOUNTER — Ambulatory Visit: Payer: Self-pay

## 2021-10-23 NOTE — Patient Outreach (Signed)
  Care Coordination   Follow Up Visit Note   10/23/2021 Name: Jonluke Cobbins MRN: [id number] DOB: [birth year]  Elford Evilsizer is a 81 y.o. year old male who sees Olin Hauser, DO for primary care. I spoke with  Tommie Raymond Moncayo's spouse Caroll by phone today.  What matters to the patients health and wellness today?  Assistance with CPAP     SDOH assessments and interventions completed:  No{THN Tip this will not be part of the note when signed-REQUIRED REPORT FIELD DO NOT DELETE (Optional):27901}     Care Coordination Interventions Activated:  Yes {THN Tip this will not be part of the note when signed-REQUIRED REPORT FIELD DO NOT DELETE (Optional):27901} Care Coordination Interventions:  Yes, provided {THN Tip this will not be part of the note when signed-REQUIRED REPORT FIELD DO NOT DELETE (Optional):27901}  Follow up plan:   Encounter Outcome:  {ENCOUTCOME:27770} {THN Tip this will not be part of the note when signed-REQUIRED REPORT FIELD DO NOT DELETE (Optional):27901}

## 2021-10-23 NOTE — Patient Instructions (Signed)
Visit Information  Thank you for allowing the Care Management team to participate in your care.  Following are the goals we discussed today:

## 2021-10-26 ENCOUNTER — Ambulatory Visit: Payer: Self-pay

## 2021-11-03 ENCOUNTER — Ambulatory Visit (INDEPENDENT_AMBULATORY_CARE_PROVIDER_SITE_OTHER): Payer: Medicare PPO

## 2021-11-03 DIAGNOSIS — I10 Essential (primary) hypertension: Secondary | ICD-10-CM

## 2021-11-03 DIAGNOSIS — I5022 Chronic systolic (congestive) heart failure: Secondary | ICD-10-CM

## 2021-11-03 NOTE — Chronic Care Management (AMB) (Signed)
CCM RN Visit Note  11-03-2021 Name: Luke Mckee MRN: [id number]      DOB: 1940-03-07  Subjective: Luke Mckee is a 81 y.o. year old male who is a primary care patient of Olin Hauser, DO. The patient was referred to the Chronic Care Management team for assistance with care management needs subsequent to provider initiation of CCM services and plan of care.      Today's Visit: Engaged with patient by telephone for initial visit.   SDOH Interventions Today    Flowsheet Row Most Recent Value  SDOH Interventions   Transportation Interventions Intervention Not Indicated  Utilities Interventions Intervention Not Indicated        Goals Addressed             This Visit's Progress    CCM Expected Outcome:  Monitor, Self-Manage and Reduce Symptoms of Heart Failure       Current Barriers:  Knowledge Deficits related to effective management of HF Chronic Disease Management support and education needs related to effective management of HF  Planned Interventions: Basic overview and discussion of pathophysiology of Heart Failure reviewed Provided education on low sodium diet Reviewed Heart Failure Action Plan in depth and provided written copy Assessed need for readable accurate scales in home Provided education about placing scale on hard, flat surface Advised patient to weigh each morning after emptying bladder Discussed importance of daily weight and advised patient to weigh and record daily Reviewed role of diuretics in prevention of fluid overload and management of heart failure Discussed the importance of keeping all appointments with provider Provided patient with education about the role of exercise in the management of heart failure Advised patient to discuss changes in fluid balance or new concerns or questions related to heart failure with provider  Symptom Management: Take medications as prescribed   Attend all scheduled provider appointments Call pharmacy  for medication refills 3-7 days in advance of running out of medications Perform all self care activities independently  Perform IADL's (shopping, preparing meals, housekeeping, managing finances) independently Call provider office for new concerns or questions  call the Suicide and Crisis Lifeline: 988 call the [place] National Suicide Prevention Lifeline: 626-462-3339 or TTY: (517)688-6530 TTY 416-743-6546) to talk to a trained counselor call 1-800-273-TALK (toll free, 24 hour hotline) if experiencing a Mental Health or [place]   Follow Up Plan: Telephone follow up appointment with care management team member scheduled for: 01-01-2022 at 1 pm       CCM Expected Outcome:  Monitor, Self-Manage, and Reduce Symptoms of Hypertension       Current Barriers:  Knowledge Deficits related to effective management of HTN Chronic Disease Management support and education needs related to HTN and heart health  Planned Interventions: Evaluation of current treatment plan related to hypertension self management and patient's adherence to plan as established by provider;   Provided education to patient re: stroke prevention, s/s of heart attack and stroke; Reviewed prescribed diet Heart Healthy Reviewed medications with patient and discussed importance of compliance;  Discussed plans with patient for ongoing care management follow up and provided patient with direct contact information for care management team; Advised patient, providing education and rationale, to monitor blood pressure daily and record, calling PCP for findings outside established parameters;  Reviewed scheduled/upcoming provider appointments including: 12-29-2021 with pcp, reminder given today  Advised patient to discuss changes in HTN or heart health with provider; Provided education on prescribed diet heart healthy;  Discussed complications of poorly  controlled blood pressure such as heart disease, stroke, circulatory  complications, vision complications, kidney impairment, sexual dysfunction;  Screening for signs and symptoms of depression related to chronic disease state;  Assessed social determinant of health barriers;   Symptom Management: Take medications as prescribed   Attend all scheduled provider appointments Call pharmacy for medication refills 3-7 days in advance of running out of medications Call provider office for new concerns or questions  call the Suicide and Crisis Lifeline: 988 call the [place] National Suicide Prevention Lifeline: 2091753549 or TTY: (763)108-4593 TTY (434) 497-9125) to talk to a trained counselor call 1-800-273-TALK (toll free, 24 hour hotline) if experiencing a Mental Health or [place]  check blood pressure weekly learn about high blood pressure call doctor for signs and symptoms of high blood pressure develop an action plan for high blood pressure keep all doctor appointments take medications for blood pressure exactly as prescribed report new symptoms to your doctor  Follow Up Plan: Telephone follow up appointment with care management team member scheduled for: 01-01-2022 at 1 pm       COMPLETED: RNCM: Effective Management of Falls and safety concerns       Care Coordination Interventions:see new plan of care Provided written and verbal education re: potential causes of falls and Fall prevention strategies. 09-07-2021: New incoming call from the wife to let the RNCM know that she forgot to tell the RNCM that the patient would start PT in [place] starting on 09-09-2021.  This is with a company called [employer]. Information obtained. Will continue to monitor for new changes or concerns Reviewed medications and discussed potential side effects of medications such as dizziness and frequent urination Advised patient of importance of notifying provider of falls. 09-07-2021: Review done with the patient. Assessed for signs and symptoms of orthostatic  hypotension. 09-07-2021: The patients wife has been taking orthostatic VS. States since changes in medications his blood pressures are better.  Assessed for falls since last encounter. 09-07-2021: Per the patient and the patients wife the patient had a fall yesterday when he was getting bird seed and on his lawn mower. States he has some bruising under his right arm. Denies hitting his head or other injuries.  Assessed patients knowledge of fall risk prevention secondary to previously provided education. 09-07-2021: Education and support given  Provided patient information for fall alert systems Assessed working status of life alert bracelet and patient adherence Advised patient to discuss new falls, changes in conditions that could cause falls, new questions and concerns with provider        COMPLETED: RNCM: Effective Management of HTN       Care Coordination Interventions: see new plan of care BP Readings from Last 3 Encounters:  08/14/21 110/70  07/03/21 113/67  04/14/21 125/75    Evaluation of current treatment plan related to hypertension self management and patient's adherence to plan as established by provider. 08-24-2021: The patient has had changes in his medications and his blood pressures are more stable. The patients wife states he feels better and is more active since being taken off of Isosorbide by the cardiologist. 09-07-2021: The patient is continuing to do well since his medication changes. They have been taking orthostatic vital signs and is pressures are more stable. The patient fells better and is resting better.  Provided education to patient re: stroke prevention, s/s of heart attack and stroke Reviewed medications with patient and discussed importance of compliance. 09-07-2021: The patient is compliant with medications. Denies any  acute findings. Also works with pharm D.  Counseled on adverse effects of illicit drug and excessive alcohol use in patients with high blood pressure   Counseled on the importance of exercise goals with target of 150 minutes per week Discussed plans with patient for ongoing care management follow up and provided patient with direct contact information for care management team Advised patient, providing education and rationale, to monitor blood pressure daily and record, calling PCP for findings outside established parameters Reviewed scheduled/upcoming provider appointments including: 09-25-2021 with pcp and [facility]. Message sent to the scheduler to see if they needs blood work pre pcp visit.  Advised patient to discuss changes in heart health or Chronic diseases with provider Provided education on prescribed diet Heart healthy diet Discussed complications of poorly controlled blood pressure such as heart disease, stroke, circulatory complications, vision complications, kidney impairment, sexual dysfunction Screening for signs and symptoms of depression related to chronic disease state  Assessed social determinant of health barriers [facility] scheduled for 09-25-2021           Plan:Telephone follow up appointment with care management team member scheduled for:  01-01-2022 at 1 pm  [place], MSN, CCM RN Care Manager  Chronic Care Management Direct Number: [phone]

## 2021-11-03 NOTE — Chronic Care Management (AMB) (Signed)
Chronic Care Management Provider Comprehensive Care Plan    Name: Luke Mckee MRN: [id number] DOB: February 16, 1940  Referral to Chronic Care Management (CCM) services was placed by Provider:  Dr. Parks Ranger on Date: 10-01-2021.  Interaction and coordination with outside resources, practitioners, and providers See CCM Referral  Chronic Condition 1: Heart failure Provider Assessment and Plan  Systolic CHF ([place])       Followed by Cardiology Today mostly euvolemic, some residual edema Continues on diuretic regimen per cardiology lasix 80 BID - advised that caution with missed doses if affecting dyspnea and wt gain should f/u with Cards       Expected Outcome/Goals Addressed This Visit: (Provider CCM Goals)/Provider Assessment and Plan  CCM (HEART FAILURE)  EXPECTED OUTCOME:  MONITOR, SELF-MANAGE AND REDUCE SYMPTOMS OF HEART FAILURE    Chronic Condition 2: HTN Provider Assessment and Plan  Essential hypertension         Relevant Medications    nitroGLYCERIN (NITROSTAT) 0.4 MG SL tablet    spironolactone (ALDACTONE) 25 MG tablet     Expected Outcome/Goals Addressed This Visit: (Provider CCM Goals)/Provider Assessment and Plan   CCM (HYPERTENSION)  EXPECTED OUTCOME:  MONITOR,SELF- MANAGE AND REDUCE SYMPTOMS OF HYPERTENSION  Problem List Patient Active Problem List   Diagnosis Date Noted   Impaired fasting glucose 09/24/2021   Depression 09/24/2021   Sensorineural hearing loss, bilateral 09/24/2021   Hypothyroid 09/24/2021   Hyperlipidemia 09/24/2021   Sleep apnea 09/24/2021   Hallux limitus, acquired, right 11/12/2019   Pre-diabetes 10/17/2019   Recurrent major depressive disorder, in partial remission ([place]) 04/16/2019   Morbid obesity ([place]) 04/16/2019   Vasomotor rhinitis 10/25/2018   Hearing loss 08/28/2018   Osteoarthritis 08/28/2018   Tinnitus 08/28/2018   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 ([place]) 07/31/2018   TIA (transient ischemic  attack) 07/23/2018   Slurred speech 07/23/2018   Right arm weakness 07/23/2018   Hypokalemia 07/23/2018   Anxiety 07/20/2018   Atherosclerosis of native coronary artery of native heart with stable angina pectoris ([place]) 06/16/2018   Hx of CABG 06/16/2018   Mixed hyperlipidemia 06/16/2018   Benign essential HTN 06/16/2018   History of cerebrovascular accident (CVA) with residual deficit [id number]   Systolic CHF ([place]) [id number]   Hypertension 02/15/2018   S/P total knee arthroplasty, right 02/15/2018   Primary osteoarthritis of both knees 11/25/2017   CAD (coronary artery disease) 11/12/2016   Abnormal nuclear stress test 06/30/2016   Hallux limitus of right foot 10/22/2015   Anesthesia complication [id number]   GERD (gastroesophageal reflux disease) 10/10/2015   Hypothyroidism 10/10/2015   Coronary artery disease involving native coronary artery of native heart with angina pectoris ([place]) 10/10/2015   OSA on CPAP 01/10/2007   Diagnosis unknown 01/12/2003    Medication Management Outpatient Encounter Medications as of 11/03/2021  Medication Sig Note   acetaminophen (TYLENOL) 650 MG CR tablet Take 1,300 mg by mouth at bedtime as needed.    albuterol (VENTOLIN HFA) 108 (90 Base) MCG/ACT inhaler Inhale 1-2 puffs into the lungs every 4 (four) hours as needed for wheezing or shortness of breath (cough).    aspirin EC 81 MG tablet Take 81 mg by mouth daily.    atenolol (TENORMIN) 50 MG tablet TAKE 1 TABLET(50 MG) BY MOUTH AT BEDTIME (Patient taking differently: Take 50 mg by mouth daily.) 09/25/2021: Takes in the a.m.   atorvastatin (LIPITOR) 80 MG tablet TAKE 1 TABLET(80 MG) BY MOUTH DAILY    Calcium Carb-Cholecalciferol (CALCIUM 600 +  D PO) Take 1 tablet by mouth daily.    Cholecalciferol (VITAMIN D) 50 MCG (2000 UT) tablet Take 4,000 Units by mouth daily.    clopidogrel (PLAVIX) 75 MG tablet Take 1 tablet (75 mg total) by mouth daily.    clotrimazole-betamethasone (LOTRISONE) cream  Apply twice a day for 1-2 weeks, may repeat if need in future    co-enzyme Q-10 30 MG capsule Take 100 mg by mouth daily.    diclofenac sodium (VOLTAREN) 1 % GEL Apply 2 g topically 4 (four) times daily as needed for pain. Foot pain    furosemide (LASIX) 80 MG tablet Take 1 tablet (80 mg total) by mouth 2 (two) times daily.    ipratropium (ATROVENT) 0.06 % nasal spray Place 2 sprays into both nostrils 4 (four) times daily as needed for rhinitis.    levothyroxine (SYNTHROID) 125 MCG tablet TAKE 1 TABLET(125 MCG) BY MOUTH DAILY BEFORE BREAKFAST    loratadine (CLARITIN) 10 MG tablet Take 10 mg by mouth daily.    LORazepam (ATIVAN) 0.5 MG tablet Take 1 tablet (0.5 mg total) by mouth daily as needed for anxiety or sleep.    montelukast (SINGULAIR) 10 MG tablet Take 1 tablet (10 mg total) by mouth at bedtime.    Multiple Vitamin (MULTIVITAMIN) tablet Take 1 tablet by mouth daily.    nitroGLYCERIN (NITROSTAT) 0.4 MG SL tablet Place 1 tablet (0.4 mg total) under the tongue every 5 (five) minutes as needed for chest pain.    PATADAY 0.1 % ophthalmic solution Place into both eyes as needed.     sennosides-docusate sodium (SENOKOT-S) 8.6-50 MG tablet Take 1-2 tablets by mouth daily as needed for constipation.    sertraline (ZOLOFT) 100 MG tablet Take 2 tablets (200 mg total) by mouth daily.    spironolactone (ALDACTONE) 25 MG tablet Take 0.5 tablets (12.5 mg total) by mouth daily.    traZODone (DESYREL) 50 MG tablet TAKE 1 TABLET(50 MG) BY MOUTH AT BEDTIME    No facility-administered encounter medications on file as of 11/03/2021.    Cognitive Assessment Identity Confirmed: [x]  Name; [x]  DOB Cognitive Status: [x]  Normal []  Abnormal  Functional Status Survey: Is the patient deaf or have difficulty hearing?: No Does the patient have difficulty seeing, even when wearing glasses/contacts?: No Does the patient have difficulty concentrating, remembering, or making decisions?: No Does the patient have  difficulty walking or climbing stairs?: Yes (uses a cane at times, history of falls, working with PT) Does the patient have difficulty dressing or bathing?: No Does the patient have difficulty doing errands alone such as visiting a doctor's office or shopping?: Yes (wife assist)  Caregiver Assessment  Current Living Arrangement: home Living arrangements - the patient lives with their spouse. Caregiver: self and spouse, Arbie Cookey assist as needed  Caregiver Relation: Spouse Status: Stable, limited availability to assist

## 2021-11-03 NOTE — Patient Instructions (Addendum)
Please call the care guide team at [phone] if you need to cancel or reschedule your appointment.   If you are experiencing a Mental Health or [place] or need someone to talk to, please call the Suicide and Crisis Lifeline: 988 call the [place] National Suicide Prevention Lifeline: 313 710 1710 or TTY: 310-860-6379 TTY (563) 370-8985) to talk to a trained counselor call 1-800-273-TALK (toll free, 24 hour hotline)   Following is a copy of your full provider care plan:   Goals Addressed             This Visit's Progress    CCM Expected Outcome:  Monitor, Self-Manage and Reduce Symptoms of Heart Failure       Current Barriers:  Knowledge Deficits related to effective management of HF Chronic Disease Management support and education needs related to effective management of HF  Planned Interventions: Basic overview and discussion of pathophysiology of Heart Failure reviewed Provided education on low sodium diet Reviewed Heart Failure Action Plan in depth and provided written copy Assessed need for readable accurate scales in home Provided education about placing scale on hard, flat surface Advised patient to weigh each morning after emptying bladder Discussed importance of daily weight and advised patient to weigh and record daily Reviewed role of diuretics in prevention of fluid overload and management of heart failure Discussed the importance of keeping all appointments with provider Provided patient with education about the role of exercise in the management of heart failure Advised patient to discuss changes in fluid balance or new concerns or questions related to heart failure with provider  Symptom Management: Take medications as prescribed   Attend all scheduled provider appointments Call pharmacy for medication refills 3-7 days in advance of running out of medications Perform all self care activities independently  Perform IADL's (shopping, preparing meals,  housekeeping, managing finances) independently Call provider office for new concerns or questions  call the Suicide and Crisis Lifeline: 988 call the [place] National Suicide Prevention Lifeline: (219) 230-9318 or TTY: 901-244-7727 TTY (985)412-2866) to talk to a trained counselor call 1-800-273-TALK (toll free, 24 hour hotline) if experiencing a Mental Health or [place]   Follow Up Plan: Telephone follow up appointment with care management team member scheduled for: 01-01-2022 at 1 pm       CCM Expected Outcome:  Monitor, Self-Manage, and Reduce Symptoms of Hypertension       Current Barriers:  Knowledge Deficits related to effective management of HTN Chronic Disease Management support and education needs related to HTN and heart health  Planned Interventions: Evaluation of current treatment plan related to hypertension self management and patient's adherence to plan as established by provider;   Provided education to patient re: stroke prevention, s/s of heart attack and stroke; Reviewed prescribed diet Heart Healthy Reviewed medications with patient and discussed importance of compliance;  Discussed plans with patient for ongoing care management follow up and provided patient with direct contact information for care management team; Advised patient, providing education and rationale, to monitor blood pressure daily and record, calling PCP for findings outside established parameters;  Reviewed scheduled/upcoming provider appointments including: 12-29-2021 with pcp, reminder given today  Advised patient to discuss changes in HTN or heart health with provider; Provided education on prescribed diet heart healthy;  Discussed complications of poorly controlled blood pressure such as heart disease, stroke, circulatory complications, vision complications, kidney impairment, sexual dysfunction;  Screening for signs and symptoms of depression related to chronic disease state;   Assessed social determinant of  health barriers;   Symptom Management: Take medications as prescribed   Attend all scheduled provider appointments Call pharmacy for medication refills 3-7 days in advance of running out of medications Call provider office for new concerns or questions  call the Suicide and Crisis Lifeline: 988 call the [place] National Suicide Prevention Lifeline: 959-825-5487 or TTY: 414-385-7549 TTY (386) 853-6786) to talk to a trained counselor call 1-800-273-TALK (toll free, 24 hour hotline) if experiencing a Mental Health or Behavioral Health Crisis  check blood pressure weekly learn about high blood pressure call doctor for signs and symptoms of high blood pressure develop an action plan for high blood pressure keep all doctor appointments take medications for blood pressure exactly as prescribed report new symptoms to your doctor  Follow Up Plan: Telephone follow up appointment with care management team member scheduled for: 01-01-2022 at 1 pm       COMPLETED: RNCM: Effective Management of Falls and safety concerns       Care Coordination Interventions:see new plan of care Provided written and verbal education re: potential causes of falls and Fall prevention strategies. 09-07-2021: New incoming call from the wife to let the RNCM know that she forgot to tell the RNCM that the patient would start PT in [place] starting on 09-09-2021.  This is with a company called [employer]. Information obtained. Will continue to monitor for new changes or concerns Reviewed medications and discussed potential side effects of medications such as dizziness and frequent urination Advised patient of importance of notifying provider of falls. 09-07-2021: Review done with the patient. Assessed for signs and symptoms of orthostatic hypotension. 09-07-2021: The patients wife has been taking orthostatic VS. States since changes in medications his blood pressures are better.  Assessed for falls  since last encounter. 09-07-2021: Per the patient and the patients wife the patient had a fall yesterday when he was getting bird seed and on his lawn mower. States he has some bruising under his right arm. Denies hitting his head or other injuries.  Assessed patients knowledge of fall risk prevention secondary to previously provided education. 09-07-2021: Education and support given  Provided patient information for fall alert systems Assessed working status of life alert bracelet and patient adherence Advised patient to discuss new falls, changes in conditions that could cause falls, new questions and concerns with provider        COMPLETED: RNCM: Effective Management of HTN       Care Coordination Interventions: see new plan of care BP Readings from Last 3 Encounters:  08/14/21 110/70  07/03/21 113/67  04/14/21 125/75    Evaluation of current treatment plan related to hypertension self management and patient's adherence to plan as established by provider. 08-24-2021: The patient has had changes in his medications and his blood pressures are more stable. The patients wife states he feels better and is more active since being taken off of Isosorbide by the cardiologist. 09-07-2021: The patient is continuing to do well since his medication changes. They have been taking orthostatic vital signs and is pressures are more stable. The patient fells better and is resting better.  Provided education to patient re: stroke prevention, s/s of heart attack and stroke Reviewed medications with patient and discussed importance of compliance. 09-07-2021: The patient is compliant with medications. Denies any acute findings. Also works with pharm D.  Counseled on adverse effects of illicit drug and excessive alcohol use in patients with high blood pressure  Counseled on the importance of exercise goals with target  of 150 minutes per week Discussed plans with patient for ongoing care management follow up and provided  patient with direct contact information for care management team Advised patient, providing education and rationale, to monitor blood pressure daily and record, calling PCP for findings outside established parameters Reviewed scheduled/upcoming provider appointments including: 09-25-2021 with pcp and AW[facility]. Message sent to the scheduler to see if they needs blood work pre pcp visit.  Advised patient to discuss changes in heart health or Chronic diseases with provider Provided education on prescribed diet Heart healthy diet Discussed complications of poorly controlled blood pressure such as heart disease, stroke, circulatory complications, vision complications, kidney impairment, sexual dysfunction Screening for signs and symptoms of depression related to chronic disease state  Assessed social determinant of health barriers [facility] scheduled for 09-25-2021           Patient verbalizes understanding of instructions and care plan provided today and agrees to view in MyChart. Active MyChart status and patient understanding of how to access instructions and care plan via MyChart confirmed with patient.     Telephone follow up appointment with care management team member scheduled for: 01-01-2022 at 1 pm

## 2021-11-04 DIAGNOSIS — I639 Cerebral infarction, unspecified: Secondary | ICD-10-CM | POA: Diagnosis not present

## 2021-11-04 DIAGNOSIS — R269 Unspecified abnormalities of gait and mobility: Secondary | ICD-10-CM | POA: Diagnosis not present

## 2021-11-04 DIAGNOSIS — M6281 Muscle weakness (generalized): Secondary | ICD-10-CM | POA: Diagnosis not present

## 2021-11-06 DIAGNOSIS — I639 Cerebral infarction, unspecified: Secondary | ICD-10-CM | POA: Diagnosis not present

## 2021-11-06 DIAGNOSIS — R269 Unspecified abnormalities of gait and mobility: Secondary | ICD-10-CM | POA: Diagnosis not present

## 2021-11-06 DIAGNOSIS — M6281 Muscle weakness (generalized): Secondary | ICD-10-CM | POA: Diagnosis not present

## 2021-11-10 DIAGNOSIS — I5020 Unspecified systolic (congestive) heart failure: Secondary | ICD-10-CM

## 2021-11-10 DIAGNOSIS — I110 Hypertensive heart disease with heart failure: Secondary | ICD-10-CM

## 2021-11-11 DIAGNOSIS — M6281 Muscle weakness (generalized): Secondary | ICD-10-CM | POA: Diagnosis not present

## 2021-11-11 DIAGNOSIS — I639 Cerebral infarction, unspecified: Secondary | ICD-10-CM | POA: Diagnosis not present

## 2021-11-11 DIAGNOSIS — R269 Unspecified abnormalities of gait and mobility: Secondary | ICD-10-CM | POA: Diagnosis not present

## 2021-11-13 DIAGNOSIS — R269 Unspecified abnormalities of gait and mobility: Secondary | ICD-10-CM | POA: Diagnosis not present

## 2021-11-13 DIAGNOSIS — I639 Cerebral infarction, unspecified: Secondary | ICD-10-CM | POA: Diagnosis not present

## 2021-11-13 DIAGNOSIS — M6281 Muscle weakness (generalized): Secondary | ICD-10-CM | POA: Diagnosis not present

## 2021-11-16 ENCOUNTER — Telehealth: Payer: Self-pay | Admitting: Family Medicine

## 2021-11-16 NOTE — Telephone Encounter (Signed)
Pt qualified for a new Cpap machine / pts wife states [employer] [facility] has sent order request for new cpap machine / they are waiting on a response from Dr. Raliegh Ip Derrek Monaco fax order to them at fax# [phone] ( Attention [facility]) / please advise asap  [facility] from [employer] [facility] cb# [phone]/UUV [phone]

## 2021-11-17 ENCOUNTER — Ambulatory Visit: Payer: Self-pay

## 2021-11-17 ENCOUNTER — Ambulatory Visit (INDEPENDENT_AMBULATORY_CARE_PROVIDER_SITE_OTHER): Payer: Medicare PPO

## 2021-11-17 DIAGNOSIS — G4733 Obstructive sleep apnea (adult) (pediatric): Secondary | ICD-10-CM

## 2021-11-17 DIAGNOSIS — I10 Essential (primary) hypertension: Secondary | ICD-10-CM

## 2021-11-17 DIAGNOSIS — I5022 Chronic systolic (congestive) heart failure: Secondary | ICD-10-CM

## 2021-11-17 NOTE — Chronic Care Management (AMB) (Signed)
Chronic Care Management   CCM RN Visit Note  11/17/2021 Name: Luke Mckee MRN: [id number] DOB: 1940-03-29  Subjective: Luke Mckee is a 81 y.o. year old male who is a primary care patient of Luke Hauser, DO. The patient was referred to the Chronic Care Management team for assistance with care management needs subsequent to provider initiation of CCM services and plan of care.    Today's Visit:  Engaged with patient by telephone for follow up visit.        Goals Addressed             This Visit's Progress    CCM Expected Outcome:  Monitor, Self-Manage and Reduce Symptoms of Heart Failure       Current Barriers:  Knowledge Deficits related to effective management of HF Chronic Disease Management support and education needs related to effective management of HF  Planned Interventions: Basic overview and discussion of pathophysiology of Heart Failure reviewed Provided education on low sodium diet Reviewed Heart Failure Action Plan in depth and provided written copy Assessed need for readable accurate scales in home Provided education about placing scale on hard, flat surface Advised patient to weigh each morning after emptying bladder Discussed importance of daily weight and advised patient to weigh and record daily Reviewed role of diuretics in prevention of fluid overload and management of heart failure Discussed the importance of keeping all appointments with provider. Review with the patient and patients wife.  Provided patient with education about the role of exercise in the management of heart failure Advised patient to discuss changes in fluid balance or new concerns or questions related to heart failure with provider. The patient states he is stable. Did have questions about the quality of his sleep related to his chronic conditions and the need for an updated CPAP. Education provided and follow up with [employer] [facility] for expressed needs. The patient denies any  acute exacerbation of his heart failure at this time.   Symptom Management: Take medications as prescribed   Attend all scheduled provider appointments Call pharmacy for medication refills 3-7 days in advance of running out of medications Perform all self care activities independently  Perform IADL's (shopping, preparing meals, housekeeping, managing finances) independently Call provider office for new concerns or questions  call the Suicide and Crisis Lifeline: 988 call the [place] National Suicide Prevention Lifeline: 541-500-5766 or TTY: 309-022-8340 TTY 747-451-0235) to talk to a trained counselor call 1-800-273-TALK (toll free, 24 hour hotline) if experiencing a Mental Health or [place]   Follow Up Plan: Telephone follow up appointment with care management team member scheduled for: 01-01-2022 at 1 pm       CCM Expected Outcome:  Monitor, Self-Manage, and Reduce Symptoms of Hypertension       Current Barriers:  Knowledge Deficits related to effective management of HTN Chronic Disease Management support and education needs related to HTN and heart health  Planned Interventions: Evaluation of current treatment plan related to hypertension self management and patient's adherence to plan as established by provider;   Provided education to patient re: stroke prevention, s/s of heart attack and stroke; Reviewed prescribed diet Heart Healthy Reviewed medications with patient and discussed importance of compliance;  Discussed plans with patient for ongoing care management follow up and provided patient with direct contact information for care management team; Advised patient, providing education and rationale, to monitor blood pressure daily and record, calling PCP for findings outside established parameters;  Reviewed scheduled/upcoming provider appointments including: 12-29-2021  with pcp, reminder given today  Advised patient to discuss changes in HTN or heart health with  provider; Provided education on prescribed diet heart healthy;  Discussed complications of poorly controlled blood pressure such as heart disease, stroke, circulatory complications, vision complications, kidney impairment, sexual dysfunction;  Screening for signs and symptoms of depression related to chronic disease state;  Assessed social determinant of health barriers;  The patient is concerned about having to use a loner CPAP and the need for an upgrade to his equipment. RNCM spoke to the patient today about his OSA and other chronic concerns. Pcp is working on paperwork to send back to [facility].  Symptom Management: Take medications as prescribed   Attend all scheduled provider appointments Call pharmacy for medication refills 3-7 days in advance of running out of medications Call provider office for new concerns or questions  call the Suicide and Crisis Lifeline: 988 call the [place] National Suicide Prevention Lifeline: (254)543-1083 or TTY: 972-797-4074 TTY (316)362-8482) to talk to a trained counselor call 1-800-273-TALK (toll free, 24 hour hotline) if experiencing a Mental Health or Behavioral Health Crisis  check blood pressure weekly learn about high blood pressure call doctor for signs and symptoms of high blood pressure develop an action plan for high blood pressure keep all doctor appointments take medications for blood pressure exactly as prescribed report new symptoms to your doctor  Follow Up Plan: Telephone follow up appointment with care management team member scheduled for: 01-01-2022 at 1 pm       CCM: Assistance with obtaining CPAP       Care Coordination Interventions: Follow up outreach with [employer] [facility]. Representative will review faxed documents from 10/13-10/16 to confirm documentation for Luke Mckee was received. Agreed to call back and contact patient if the agency did not receive the required documents. Of note, representative indicated that if the  fax isn't working correctly he would discuss with documentation team to determine if documents can be scanned via email. [facility] email provided. Will update PCP if additional documentation is required. Call made x 2 today to [employer] [facility] at [phone] ext [phone], spoke to [facility]. Charlotte Sanes states she has been working with a representative to get paperwork to the pcp. The RNCM informed Charlotte Sanes that no paperwork has been received pertaining to the cpap equipment. Ask Charlotte Sanes to please send documents with needed items to the pcp at [phone]. Reviewed with [facility] what the patient needs. She said that a new sleep study information would need to be obtained. The review and collaboration with the pcp and assisting RNCM revealed that a new sleep study did not need to be done, just and upgrade of equipment. Charlotte Sanes states that this is noted as being correct information.    Collaboration with the care team and pcp at [facility]. Documents have been sent. Call made to the patient and the patients wife and education provided that the pcp will fill out paperwork and return it to [employer]. The patient and his wife verbalized understanding. Will continue to monitor for changes or new needs.         Plan:Telephone follow up appointment with care management team member scheduled for:  01-01-2022 at 1 pm  Alto Denver RN, MSN, CCM RN Care Manager  Chronic Care Management Direct Number: [phone]

## 2021-11-17 NOTE — Progress Notes (Signed)
Error in charting.

## 2021-11-17 NOTE — Telephone Encounter (Signed)
We have not received this fax yet. I asked our office staff to check status, and still cannot locate it.  Will likely need to call [facility] back to follow up on it  [place] waiting on my response, but I am waiting on their order.  Nobie Putnam, [facility] 11/17/2021, 10:07 AM

## 2021-11-17 NOTE — Patient Instructions (Signed)
Please call the care guide team at [phone] if you need to cancel or reschedule your appointment.   If you are experiencing a Mental Health or Behavioral Health Crisis or need someone to talk to, please call the Suicide and Crisis Lifeline: 988 call the [place] National Suicide Prevention Lifeline: 413-522-2233 or TTY: 724-114-5042 TTY [phone]) to talk to a trained counselor call 1-800-273-TALK (toll free, 24 hour hotline)   Following is a copy of your full provider care plan:   Goals Addressed             This Visit's Progress    CCM Expected Outcome:  Monitor, Self-Manage and Reduce Symptoms of Heart Failure       Current Barriers:  Knowledge Deficits related to effective management of HF Chronic Disease Management support and education needs related to effective management of HF  Planned Interventions: Basic overview and discussion of pathophysiology of Heart Failure reviewed Provided education on low sodium diet Reviewed Heart Failure Action Plan in depth and provided written copy Assessed need for readable accurate scales in home Provided education about placing scale on hard, flat surface Advised patient to weigh each morning after emptying bladder Discussed importance of daily weight and advised patient to weigh and record daily Reviewed role of diuretics in prevention of fluid overload and management of heart failure Discussed the importance of keeping all appointments with provider. Review with the patient and patients wife.  Provided patient with education about the role of exercise in the management of heart failure Advised patient to discuss changes in fluid balance or new concerns or questions related to heart failure with provider. The patient states he is stable. Did have questions about the quality of his sleep related to his chronic conditions and the need for an updated CPAP. Education provided and follow up with [facility] for expressed needs. The patient  denies any acute exacerbation of his heart failure at this time.   Symptom Management: Take medications as prescribed   Attend all scheduled provider appointments Call pharmacy for medication refills 3-7 days in advance of running out of medications Perform all self care activities independently  Perform IADL's (shopping, preparing meals, housekeeping, managing finances) independently Call provider office for new concerns or questions  call the Suicide and Crisis Lifeline: 988 call the [place] National Suicide Prevention Lifeline: 248-136-3544 or TTY: (405)301-6098 TTY (951) 168-8802) to talk to a trained counselor call 1-800-273-TALK (toll free, 24 hour hotline) if experiencing a Mental Health or Behavioral Health Crisis   Follow Up Plan: Telephone follow up appointment with care management team member scheduled for: 01-01-2022 at 1 pm       CCM Expected Outcome:  Monitor, Self-Manage, and Reduce Symptoms of Hypertension       Current Barriers:  Knowledge Deficits related to effective management of HTN Chronic Disease Management support and education needs related to HTN and heart health  Planned Interventions: Evaluation of current treatment plan related to hypertension self management and patient's adherence to plan as established by provider;   Provided education to patient re: stroke prevention, s/s of heart attack and stroke; Reviewed prescribed diet Heart Healthy Reviewed medications with patient and discussed importance of compliance;  Discussed plans with patient for ongoing care management follow up and provided patient with direct contact information for care management team; Advised patient, providing education and rationale, to monitor blood pressure daily and record, calling PCP for findings outside established parameters;  Reviewed scheduled/upcoming provider appointments including: 12-29-2021 with pcp, reminder given today  Advised patient to discuss changes in HTN or heart  health with provider; Provided education on prescribed diet heart healthy;  Discussed complications of poorly controlled blood pressure such as heart disease, stroke, circulatory complications, vision complications, kidney impairment, sexual dysfunction;  Screening for signs and symptoms of depression related to chronic disease state;  Assessed social determinant of health barriers;  The patient is concerned about having to use a loner CPAP and the need for an upgrade to his equipment. RNCM spoke to the patient today about his OSA and other chronic concerns. Pcp is working on paperwork to send back to [facility].  Symptom Management: Take medications as prescribed   Attend all scheduled provider appointments Call pharmacy for medication refills 3-7 days in advance of running out of medications Call provider office for new concerns or questions  call the Suicide and Crisis Lifeline: 988 call the [place] National Suicide Prevention Lifeline: 7376681404 or TTY: (425) 478-3558 TTY 601-180-8268) to talk to a trained counselor call 1-800-273-TALK (toll free, 24 hour hotline) if experiencing a Mental Health or [place]  check blood pressure weekly learn about high blood pressure call doctor for signs and symptoms of high blood pressure develop an action plan for high blood pressure keep all doctor appointments take medications for blood pressure exactly as prescribed report new symptoms to your doctor  Follow Up Plan: Telephone follow up appointment with care management team member scheduled for: 01-01-2022 at 1 pm       CCM: Assistance with obtaining CPAP       Care Coordination Interventions: Follow up outreach with [facility]. Representative will review faxed documents from 10/13-10/16 to confirm documentation for Mr. Fawaz was received. Agreed to call back and contact patient if the agency did not receive the required documents. Of note, representative indicated  that if the fax isn't working correctly he would discuss with documentation team to determine if documents can be scanned via email. [place] email provided. Will update PCP if additional documentation is required. Call made x 2 today to [facility] at [phone] ext [phone], spoke to [facility]. Overton Mam states she has been working with a representative to get paperwork to the pcp. The RNCM informed Overton Mam that no paperwork has been received pertaining to the cpap equipment. Ask Overton Mam to please send documents with needed items to the pcp at [phone]. Reviewed with [facility] what the patient needs. She said that a new sleep study information would need to be obtained. The review and collaboration with the pcp and assisting RNCM revealed that a new sleep study did not need to be done, just and upgrade of equipment. Overton Mam states that this is noted as being correct information.    Collaboration with the care team and pcp at [facility]. Documents have been sent. Call made to the patient and the patients wife and education provided that the pcp will fill out paperwork and return it to [facility]. The patient and his wife verbalized understanding. Will continue to monitor for changes or new needs.         Patient verbalizes understanding of instructions and care plan provided today and agrees to view in [place]. Active MyChart status and patient understanding of how to access instructions and care plan via MyChart confirmed with patient.     Telephone follow up appointment with care management team member scheduled for: 01-01-2022 at 1 pm

## 2021-11-18 ENCOUNTER — Telehealth: Payer: Medicare PPO

## 2021-11-18 DIAGNOSIS — M6281 Muscle weakness (generalized): Secondary | ICD-10-CM | POA: Diagnosis not present

## 2021-11-18 DIAGNOSIS — R269 Unspecified abnormalities of gait and mobility: Secondary | ICD-10-CM | POA: Diagnosis not present

## 2021-11-18 DIAGNOSIS — I639 Cerebral infarction, unspecified: Secondary | ICD-10-CM | POA: Diagnosis not present

## 2021-11-20 DIAGNOSIS — M6281 Muscle weakness (generalized): Secondary | ICD-10-CM | POA: Diagnosis not present

## 2021-11-20 DIAGNOSIS — I639 Cerebral infarction, unspecified: Secondary | ICD-10-CM | POA: Diagnosis not present

## 2021-11-20 DIAGNOSIS — R269 Unspecified abnormalities of gait and mobility: Secondary | ICD-10-CM | POA: Diagnosis not present

## 2021-11-23 DIAGNOSIS — M6281 Muscle weakness (generalized): Secondary | ICD-10-CM | POA: Diagnosis not present

## 2021-11-23 DIAGNOSIS — I639 Cerebral infarction, unspecified: Secondary | ICD-10-CM | POA: Diagnosis not present

## 2021-11-23 DIAGNOSIS — R269 Unspecified abnormalities of gait and mobility: Secondary | ICD-10-CM | POA: Diagnosis not present

## 2021-11-24 ENCOUNTER — Ambulatory Visit: Payer: Self-pay

## 2021-11-24 DIAGNOSIS — G4733 Obstructive sleep apnea (adult) (pediatric): Secondary | ICD-10-CM

## 2021-11-24 DIAGNOSIS — I5022 Chronic systolic (congestive) heart failure: Secondary | ICD-10-CM

## 2021-11-24 DIAGNOSIS — I10 Essential (primary) hypertension: Secondary | ICD-10-CM

## 2021-11-24 NOTE — Chronic Care Management (AMB) (Signed)
Chronic Care Management   CCM RN Visit Note  11/24/2021 Name: Luke Mckee MRN: [id number] DOB: November 02, 1940  Subjective: Luke Mckee is a 81 y.o. year old male who is a primary care patient of Luke Cords, DO. The patient was referred to the Chronic Care Management team for assistance with care management needs subsequent to provider initiation of CCM services and plan of care.    Today's Visit:  Engaged with patient by telephone for follow up visit.        Goals Addressed             This Visit's Progress    CCM Expected Outcome:  Monitor, Self-Manage and Reduce Symptoms of Heart Failure       Current Barriers:  Knowledge Deficits related to effective management of HF Chronic Disease Management support and education needs related to effective management of HF  Planned Interventions: Basic overview and discussion of pathophysiology of Heart Failure reviewed Provided education on low sodium diet Reviewed Heart Failure Action Plan in depth and provided written copy Assessed need for readable accurate scales in home Provided education about placing scale on hard, flat surface Advised patient to weigh each morning after emptying bladder Discussed importance of daily weight and advised patient to weigh and record daily Reviewed role of diuretics in prevention of fluid overload and management of heart failure Discussed the importance of keeping all appointments with provider. Review with the patient and patients wife.  Provided patient with education about the role of exercise in the management of heart failure Advised patient to discuss changes in fluid balance or new concerns or questions related to heart failure with provider. The patient states he is stable. Did have questions about the quality of his sleep related to his chronic conditions and the need for an updated CPAP. Education provided and follow up with [facility] for expressed needs. The patient denies any  acute exacerbation of his heart failure at this time. Further information provided from the patients wife concerning the needed documents to complete the order for an upgraded CPAP.  Symptom Management: Take medications as prescribed   Attend all scheduled provider appointments Call pharmacy for medication refills 3-7 days in advance of running out of medications Perform all self care activities independently  Perform IADL's (shopping, preparing meals, housekeeping, managing finances) independently Call provider office for new concerns or questions  call the Suicide and Crisis Lifeline: 988 call the [place] National Suicide Prevention Lifeline: 608 710 0562 or TTY: 731-824-7516 TTY [phone]) to talk to a trained counselor call 1-800-273-TALK (toll free, 24 hour hotline) if experiencing a Mental Health or Behavioral Health Crisis   Follow Up Plan: Telephone follow up appointment with care management team member scheduled for: 01-01-2022 at 1 pm       CCM Expected Outcome:  Monitor, Self-Manage, and Reduce Symptoms of Hypertension       Current Barriers:  Knowledge Deficits related to effective management of HTN Chronic Disease Management support and education needs related to HTN and heart health  Planned Interventions: Evaluation of current treatment plan related to hypertension self management and patient's adherence to plan as established by provider;   Provided education to patient re: stroke prevention, s/s of heart attack and stroke; Reviewed prescribed diet Heart Healthy Reviewed medications with patient and discussed importance of compliance;  Discussed plans with patient for ongoing care management follow up and provided patient with direct contact information for care management team; Advised patient, providing education and rationale, to monitor blood  pressure daily and record, calling PCP for findings outside established parameters;  Reviewed scheduled/upcoming provider  appointments including: 12-29-2021 with pcp, reminder given today  Advised patient to discuss changes in HTN or heart health with provider; Provided education on prescribed diet heart healthy;  Discussed complications of poorly controlled blood pressure such as heart disease, stroke, circulatory complications, vision complications, kidney impairment, sexual dysfunction;  Screening for signs and symptoms of depression related to chronic disease state;  Assessed social determinant of health barriers;  The patient is concerned about having to use a loner CPAP and the need for an upgrade to his equipment. RNCM spoke to the patient today about his OSA and other chronic concerns. Secure message sent to the pcp concerning the CPAP machine and [employer] needing additional information to process the CPAP order.   Symptom Management: Take medications as prescribed   Attend all scheduled provider appointments Call pharmacy for medication refills 3-7 days in advance of running out of medications Call provider office for new concerns or questions  call the Suicide and Crisis Lifeline: 988 call the [place] National Suicide Prevention Lifeline: 2243141843 or TTY: (367) 494-9277 TTY [phone]) to talk to a trained counselor call 1-800-273-TALK (toll free, 24 hour hotline) if experiencing a Mental Health or Behavioral Health Crisis  check blood pressure weekly learn about high blood pressure call doctor for signs and symptoms of high blood pressure develop an action plan for high blood pressure keep all doctor appointments take medications for blood pressure exactly as prescribed report new symptoms to your doctor  Follow Up Plan: Telephone follow up appointment with care management team member scheduled for: 01-01-2022 at 1 pm       CCM: Assistance with obtaining CPAP       Care Coordination Interventions: Follow up outreach with [employer] [facility]. Representative will review faxed documents  from 10/13-10/16 to confirm documentation for Luke Mckee was received. Agreed to call back and contact patient if the agency did not receive the required documents. Of note, representative indicated that if the fax isn't working correctly he would discuss with documentation team to determine if documents can be scanned via email. [facility] email provided. Will update PCP if additional documentation is required. Call made back to [facility] at [phone] ext [phone], spoke to [facility]. Follow up with [facility] related to the call from the patients wife stating that [facility] still did not have the information they needed to process the order for the patients CPAP.  Charlotte Sanes states that she needs a note for use and benefits for the cpap that the notes from 08-29-2021 did not contain that information.  They also need an order for the cpap with pressure settings. Secure message sent to the pcp with information needed to be sent to [employer] so the order can be processed and the patient can get his replacement CPAP.  After collaboration with pcp the patient will see the pcp on 11-25-2021 at 4 pm via video to complete the needed orders.          Plan:Telephone follow up appointment with care management team member scheduled for:  as scheduled  Alto Denver RN, MSN, CCM RN Care Manager  Chronic Care Management Direct Number: [phone]

## 2021-11-24 NOTE — Patient Instructions (Signed)
Please call the care guide team at [phone] if you need to cancel or reschedule your appointment.   If you are experiencing a Mental Health or Behavioral Health Crisis or need someone to talk to, please call the Suicide and Crisis Lifeline: 988 call the [place] National Suicide Prevention Lifeline: 251 304 1624 or TTY: (276) 444-2861 TTY [phone]) to talk to a trained counselor call 1-800-273-TALK (toll free, 24 hour hotline)   Following is a copy of your full provider care plan:   Goals Addressed             This Visit's Progress    CCM Expected Outcome:  Monitor, Self-Manage and Reduce Symptoms of Heart Failure       Current Barriers:  Knowledge Deficits related to effective management of HF Chronic Disease Management support and education needs related to effective management of HF  Planned Interventions: Basic overview and discussion of pathophysiology of Heart Failure reviewed Provided education on low sodium diet Reviewed Heart Failure Action Plan in depth and provided written copy Assessed need for readable accurate scales in home Provided education about placing scale on hard, flat surface Advised patient to weigh each morning after emptying bladder Discussed importance of daily weight and advised patient to weigh and record daily Reviewed role of diuretics in prevention of fluid overload and management of heart failure Discussed the importance of keeping all appointments with provider. Review with the patient and patients wife.  Provided patient with education about the role of exercise in the management of heart failure Advised patient to discuss changes in fluid balance or new concerns or questions related to heart failure with provider. The patient states he is stable. Did have questions about the quality of his sleep related to his chronic conditions and the need for an updated CPAP. Education provided and follow up with [facility] for expressed needs. The patient  denies any acute exacerbation of his heart failure at this time. Further information provided from the patients wife concerning the needed documents to complete the order for an upgraded CPAP.  Symptom Management: Take medications as prescribed   Attend all scheduled provider appointments Call pharmacy for medication refills 3-7 days in advance of running out of medications Perform all self care activities independently  Perform IADL's (shopping, preparing meals, housekeeping, managing finances) independently Call provider office for new concerns or questions  call the Suicide and Crisis Lifeline: 988 call the [place] National Suicide Prevention Lifeline: 541-244-2875 or TTY: 940 266 1036 TTY 249-281-6482) to talk to a trained counselor call 1-800-273-TALK (toll free, 24 hour hotline) if experiencing a Mental Health or Behavioral Health Crisis   Follow Up Plan: Telephone follow up appointment with care management team member scheduled for: 01-01-2022 at 1 pm       CCM Expected Outcome:  Monitor, Self-Manage, and Reduce Symptoms of Hypertension       Current Barriers:  Knowledge Deficits related to effective management of HTN Chronic Disease Management support and education needs related to HTN and heart health  Planned Interventions: Evaluation of current treatment plan related to hypertension self management and patient's adherence to plan as established by provider;   Provided education to patient re: stroke prevention, s/s of heart attack and stroke; Reviewed prescribed diet Heart Healthy Reviewed medications with patient and discussed importance of compliance;  Discussed plans with patient for ongoing care management follow up and provided patient with direct contact information for care management team; Advised patient, providing education and rationale, to monitor blood pressure daily and record, calling  PCP for findings outside established parameters;  Reviewed scheduled/upcoming  provider appointments including: 12-29-2021 with pcp, reminder given today  Advised patient to discuss changes in HTN or heart health with provider; Provided education on prescribed diet heart healthy;  Discussed complications of poorly controlled blood pressure such as heart disease, stroke, circulatory complications, vision complications, kidney impairment, sexual dysfunction;  Screening for signs and symptoms of depression related to chronic disease state;  Assessed social determinant of health barriers;  The patient is concerned about having to use a loner CPAP and the need for an upgrade to his equipment. RNCM spoke to the patient today about his OSA and other chronic concerns. Secure message sent to the pcp concerning the CPAP machine and [employer] needing additional information to process the CPAP order.   Symptom Management: Take medications as prescribed   Attend all scheduled provider appointments Call pharmacy for medication refills 3-7 days in advance of running out of medications Call provider office for new concerns or questions  call the Suicide and Crisis Lifeline: 988 call the [place] National Suicide Prevention Lifeline: (210) 283-0537 or TTY: 231-253-5303 TTY 812-377-8716) to talk to a trained counselor call 1-800-273-TALK (toll free, 24 hour hotline) if experiencing a Mental Health or Behavioral Health Crisis  check blood pressure weekly learn about high blood pressure call doctor for signs and symptoms of high blood pressure develop an action plan for high blood pressure keep all doctor appointments take medications for blood pressure exactly as prescribed report new symptoms to your doctor  Follow Up Plan: Telephone follow up appointment with care management team member scheduled for: 01-01-2022 at 1 pm       CCM: Assistance with obtaining CPAP       Care Coordination Interventions: Follow up outreach with [employer] [facility]. Representative will review faxed  documents from 10/13-10/16 to confirm documentation for Luke Mckee was received. Agreed to call back and contact patient if the agency did not receive the required documents. Of note, representative indicated that if the fax isn't working correctly he would discuss with documentation team to determine if documents can be scanned via email. [facility] email provided. Will update PCP if additional documentation is required. Call made back to [facility] at [phone] ext [phone], spoke to [facility]. Follow up with [facility] related to the call from the patients wife stating that [employer] still did not have the information they needed to process the order for the patients CPAP.  Charlotte Sanes states that she needs a note for use and benefits for the cpap that the notes from 08-29-2021 did not contain that information.  They also need an order for the cpap with pressure settings. Secure message sent to the pcp with information needed to be sent to [employer] so the order can be processed and the patient can get his replacement CPAP.  After collaboration with pcp the patient will see the pcp on 11-25-2021 at 4 pm via video to complete the needed orders.          Patient verbalizes understanding of instructions and care plan provided today and agrees to view in MyChart. Active MyChart status and patient understanding of how to access instructions and care plan via MyChart confirmed with patient.     Telephone follow up appointment with care management team member scheduled for: as scheduled

## 2021-11-25 ENCOUNTER — Encounter: Payer: Self-pay | Admitting: Family Medicine

## 2021-11-25 ENCOUNTER — Telehealth (INDEPENDENT_AMBULATORY_CARE_PROVIDER_SITE_OTHER): Payer: Medicare PPO | Admitting: Family Medicine

## 2021-11-25 VITALS — Ht 68.0 in | Wt 239.0 lb

## 2021-11-25 DIAGNOSIS — G4733 Obstructive sleep apnea (adult) (pediatric): Secondary | ICD-10-CM | POA: Diagnosis not present

## 2021-11-25 NOTE — Progress Notes (Signed)
Virtual Visit via Telephone The purpose of this virtual visit is to provide medical care while limiting exposure to the novel coronavirus (COVID19) for both patient and office staff.  Consent was obtained for phone visit:  Yes.   Answered questions that patient had about telehealth interaction:  Yes.   I discussed the limitations, risks, security and privacy concerns of performing an evaluation and management service by telephone. I also discussed with the patient that there may be a patient responsible charge related to this service. The patient expressed understanding and agreed to proceed.  Patient Location: Home Provider Location: [facility] (Office)  Participants in virtual visit: - Patient: Luke Mckee - CMA: Burnell Blanks, CMA - Provider: Dr [name]  ---------------------------------------------------------------------- Chief Complaint  Patient presents with   Discuss CPAP    S: Reviewed CMA documentation. I have called patient and gathered additional HPI as follows:   OSA, on CPAP - Patient reports prior history of dx OSA and on CPAP for years, prior to treatment initial symptoms were snoring, witnessed apnea, daytime sleepiness and fatigue. - Before he received his CPAP initially, he had issues with poor sleep, waking up, apnea, daytime sleepiness, with high EPSS and scores. - He has had prior sleep study in the past.  - Today reports that sleep apnea is well controlled on CPAP. He uses the CPAP machine every night. Tolerates the machine well, and thinks that sleeps better with it and feels good and he is benefiting from therapy. - He has no new concerns or symptoms. - His current pressure setting for CPAP machine is set to 16-18 cm H2O  He is in need of a new CPAP replacement machine at this time, due to non functioning current machine, he had it evaluated recently and it was not humidifying or functioning. He was given temporary or loner  machine until we could order the new replacement CPAP machine.  He has been in communication with [facility] and they have requested new documentation and order for CPAP. Also coordinating with [place] RN CCM team.  Denies any fevers, chills, sweats, body ache, cough, shortness of breath, sinus pain or pressure, headache, abdominal pain, diarrhea  Past Medical History:  Diagnosis Date   Anxiety    Arthritis    CAD (coronary artery disease) 2005   s/p CABG, DES   CHF (congestive heart failure) (HCC)    in EPIC care everywhere   Chronic kidney disease    COPD (chronic obstructive pulmonary disease) (HCC)    in EPIC care everywhere   Depression    Heart murmur    History of stroke    Hypothyroidism    OSA (obstructive sleep apnea)    Paroxysmal A-fib (HCC)    in EPIC careeverywhere   Social History   Tobacco Use   Smoking status: Never   Smokeless tobacco: Never  Vaping Use   Vaping Use: Never used  Substance Use Topics   Alcohol use: Not Currently    Comment: past   Drug use: Never    Current Outpatient Medications:    acetaminophen (TYLENOL) 650 MG CR tablet, Take 1,300 mg by mouth at bedtime as needed., Disp: , Rfl:    albuterol (VENTOLIN HFA) 108 (90 Base) MCG/ACT inhaler, Inhale 1-2 puffs into the lungs every 4 (four) hours as needed for wheezing or shortness of breath (cough)., Disp: 1 each, Rfl: 3   aspirin EC 81 MG tablet, Take 81 mg by mouth daily., Disp: ,  Rfl:    atenolol (TENORMIN) 50 MG tablet, TAKE 1 TABLET(50 MG) BY MOUTH AT BEDTIME (Patient taking differently: Take 50 mg by mouth daily.), Disp: 90 tablet, Rfl: 3   atorvastatin (LIPITOR) 80 MG tablet, TAKE 1 TABLET(80 MG) BY MOUTH DAILY, Disp: 90 tablet, Rfl: 3   Calcium Carb-Cholecalciferol (CALCIUM 600 + D PO), Take 1 tablet by mouth daily., Disp: , Rfl:    Cholecalciferol (VITAMIN D) 50 MCG (2000 UT) tablet, Take 4,000 Units by mouth daily., Disp: , Rfl:    clopidogrel (PLAVIX) 75 MG tablet,  Take 1 tablet (75 mg total) by mouth daily., Disp: 90 tablet, Rfl: 3   clotrimazole-betamethasone (LOTRISONE) cream, Apply twice a day for 1-2 weeks, may repeat if need in future, Disp: 30 g, Rfl: 1   co-enzyme Q-10 30 MG capsule, Take 100 mg by mouth daily., Disp: , Rfl:    diclofenac sodium (VOLTAREN) 1 % GEL, Apply 2 g topically 4 (four) times daily as needed for pain. Foot pain, Disp: , Rfl:    furosemide (LASIX) 80 MG tablet, Take 1 tablet (80 mg total) by mouth 2 (two) times daily., Disp: 180 tablet, Rfl: 3   ipratropium (ATROVENT) 0.06 % nasal spray, Place 2 sprays into both nostrils 4 (four) times daily as needed for rhinitis., Disp: 15 mL, Rfl: 2   levothyroxine (SYNTHROID) 125 MCG tablet, TAKE 1 TABLET(125 MCG) BY MOUTH DAILY BEFORE BREAKFAST, Disp: 90 tablet, Rfl: 3   loratadine (CLARITIN) 10 MG tablet, Take 10 mg by mouth daily., Disp: , Rfl:    LORazepam (ATIVAN) 0.5 MG tablet, Take 1 tablet (0.5 mg total) by mouth daily as needed for anxiety or sleep., Disp: 30 tablet, Rfl: 1   montelukast (SINGULAIR) 10 MG tablet, Take 1 tablet (10 mg total) by mouth at bedtime., Disp: 90 tablet, Rfl: 3   Multiple Vitamin (MULTIVITAMIN) tablet, Take 1 tablet by mouth daily., Disp: , Rfl:    nitroGLYCERIN (NITROSTAT) 0.4 MG SL tablet, Place 1 tablet (0.4 mg total) under the tongue every 5 (five) minutes as needed for chest pain., Disp: 20 tablet, Rfl: 2   PATADAY 0.1 % ophthalmic solution, Place into both eyes as needed. , Disp: , Rfl:    sennosides-docusate sodium (SENOKOT-S) 8.6-50 MG tablet, Take 1-2 tablets by mouth daily as needed for constipation., Disp: , Rfl:    sertraline (ZOLOFT) 100 MG tablet, Take 2 tablets (200 mg total) by mouth daily., Disp: 180 tablet, Rfl: 3   spironolactone (ALDACTONE) 25 MG tablet, Take 0.5 tablets (12.5 mg total) by mouth daily., Disp: 45 tablet, Rfl: 3   traZODone (DESYREL) 50 MG tablet, TAKE 1 TABLET(50 MG) BY MOUTH AT BEDTIME, Disp: 90 tablet, Rfl: 3      09/25/2021    2:41 PM 07/03/2021    3:37 PM 10/16/2020    9:38 AM  Depression screen PHQ 2/9  Decreased Interest 0  0  Down, Depressed, Hopeless 1 0 0  PHQ - 2 Score 1 0 0  Altered sleeping 0 0 0  Tired, decreased energy 1 0 0  Change in appetite 0 0 0  Feeling bad or failure about yourself  0 0 0  Trouble concentrating 0 0 0  Moving slowly or fidgety/restless 0 0 0  Suicidal thoughts 0 0 0  PHQ-9 Score 2 0 0  Difficult doing work/chores Not difficult at all Not difficult at all Not difficult at all       07/03/2021    3:37 PM 10/17/2019  2:45 PM 04/16/2019   11:37 AM 07/31/2018    8:13 AM  GAD 7 : Generalized Anxiety Score  Nervous, Anxious, on Edge 1 1 1 2   Control/stop worrying 1 1 0 1  Worry too much - different things 1 1 1 1   Trouble relaxing 0 0 0 0  Restless 0 0 0 0  Easily annoyed or irritable 0 0 1 1  Afraid - awful might happen 0 0 0 0  Total GAD 7 Score 3 3 3 5   Anxiety Difficulty Not difficult at all Not difficult at all Not difficult at all Somewhat difficult    -------------------------------------------------------------------------- O: No physical exam performed due to remote telephone encounter.  Lab results reviewed.  Recent Results (from the past 2160 hour(s))  CUP PACEART REMOTE DEVICE CHECK     Status: None   Collection Time: 09/27/21 11:09 PM  Result Value Ref Range   Date Time Interrogation Session [phone]    Pulse Generator Manufacturer MERM    Pulse Gen Model [id number] Reveal LINQ    Pulse Gen Serial Number [id number]    Clinic Name [facility]    Implantable Pulse Generator Type ICM/ILR    Implantable Pulse Generator Implant Date [id number]     -------------------------------------------------------------------------- A&P:  Problem List Items Addressed This Visit     OSA on CPAP - Primary   Well controlled, chronic OSA on CPAP - Good adherence to CPAP every night use. - Continue current CPAP therapy, patient is benefiting from  therapy  Comorbid health conditions that will benefit from treatment of OSA include HYPERTENSION, CAD, Systolic CHF, Pre-Diabetes  He will need new replacement CPAP due to non functional CPAP machine  Orders today with printed DME order for new CPAP machine Pressure Setting 16-18 mm H2O  [facility] at fax# [phone] ( Attention [facility])  No orders of the defined types were placed in this encounter.   Follow-up: PRN  Patient verbalizes understanding with the above medical recommendations including the limitation of remote medical advice.  Specific follow-up and call-back criteria were given for patient to follow-up or seek medical care more urgently if needed.   - Time spent in direct consultation with patient on phone: 10 minutes   Nobie Putnam, [facility] 11/25/2021, 4:14 PM

## 2021-11-25 NOTE — Patient Instructions (Signed)
° °  Please schedule a Follow-up Appointment to: No follow-ups on file. ° °If you have any other questions or concerns, please feel free to call the office or send a message through MyChart. You may also schedule an earlier appointment if necessary. ° °Additionally, you may be receiving a survey about your experience at our office within a few days to 1 week by e-mail or mail. We value your feedback. ° °Seretha Estabrooks, DO °[facility] °

## 2021-11-26 ENCOUNTER — Ambulatory Visit: Payer: Self-pay

## 2021-11-26 DIAGNOSIS — I10 Essential (primary) hypertension: Secondary | ICD-10-CM

## 2021-11-26 DIAGNOSIS — G4733 Obstructive sleep apnea (adult) (pediatric): Secondary | ICD-10-CM

## 2021-11-26 DIAGNOSIS — I5022 Chronic systolic (congestive) heart failure: Secondary | ICD-10-CM

## 2021-11-26 NOTE — Patient Instructions (Signed)
Please call the care guide team at [phone] if you need to cancel or reschedule your appointment.   If you are experiencing a Mental Health or [place] or need someone to talk to, please call the Suicide and Crisis Lifeline: 988 call the [place] National Suicide Prevention Lifeline: 507-776-8823 or TTY: (762) 193-2861 TTY 630-616-9353) to talk to a trained counselor call 1-800-273-TALK (toll free, 24 hour hotline)   Following is a copy of your full provider care plan:   Goals Addressed             This Visit's Progress    CCM Expected Outcome:  Monitor, Self-Manage and Reduce Symptoms of Heart Failure       Current Barriers:  Knowledge Deficits related to effective management of HF Chronic Disease Management support and education needs related to effective management of HF  Planned Interventions: Basic overview and discussion of pathophysiology of Heart Failure reviewed Provided education on low sodium diet Reviewed Heart Failure Action Plan in depth and provided written copy Assessed need for readable accurate scales in home Provided education about placing scale on hard, flat surface Advised patient to weigh each morning after emptying bladder Discussed importance of daily weight and advised patient to weigh and record daily Reviewed role of diuretics in prevention of fluid overload and management of heart failure Discussed the importance of keeping all appointments with provider. Review with the patient and patients wife. Saw pcp by video for processing of CPAP orders on 11-25-2021 Provided patient with education about the role of exercise in the management of heart failure Advised patient to discuss changes in fluid balance or new concerns or questions related to heart failure with provider. The patient states he is stable. Did have questions about the quality of his sleep related to his chronic conditions and the need for an updated CPAP. Education provided and  follow up with [facility] for expressed needs. The patient denies any acute exacerbation of his heart failure at this time. Further information provided from the patients wife concerning the needed documents to complete the order for an upgraded CPAP. Needed paperwork was sent to [facility] after provider visit on 11-25-2021. The RNCM has spoke to [place], the representative at [facility] for updated information. Also collaboration with Dr. Raliegh Ip on information and talking with Gae Bon at [facility]. Gae Bon will let the RNCM know if there are new needs for documentation.   Symptom Management: Take medications as prescribed   Attend all scheduled provider appointments Call pharmacy for medication refills 3-7 days in advance of running out of medications Perform all self care activities independently  Perform IADL's (shopping, preparing meals, housekeeping, managing finances) independently Call provider office for new concerns or questions  call the Suicide and Crisis Lifeline: 988 call the [place] National Suicide Prevention Lifeline: 321 061 3149 or TTY: (442)418-4881 TTY 605-047-1777) to talk to a trained counselor call 1-800-273-TALK (toll free, 24 hour hotline) if experiencing a Mental Health or [place]   Follow Up Plan: Telephone follow up appointment with care management team member scheduled for: 01-01-2022 at 1 pm       CCM Expected Outcome:  Monitor, Self-Manage, and Reduce Symptoms of Hypertension       Current Barriers:  Knowledge Deficits related to effective management of HTN Chronic Disease Management support and education needs related to HTN and heart health BP Readings from Last 3 Encounters:  09/25/21 120/80  08/14/21 110/70  07/03/21 113/67     Planned Interventions: Evaluation of  current treatment plan related to hypertension self management and patient's adherence to plan as established by provider;   Provided education to patient re: stroke prevention, s/s  of heart attack and stroke; Reviewed prescribed diet Heart Healthy Reviewed medications with patient and discussed importance of compliance;  Discussed plans with patient for ongoing care management follow up and provided patient with direct contact information for care management team; Advised patient, providing education and rationale, to monitor blood pressure daily and record, calling PCP for findings outside established parameters;  Reviewed scheduled/upcoming provider appointments including: 12-29-2021 with pcp, reminder given today. Had a video visit on 11-25-2021 to get updated information for Sleep apnea needs and order sent to [facility] for new replacement CPAP machine Advised patient to discuss changes in HTN or heart health with provider; Provided education on prescribed diet heart healthy;  Discussed complications of poorly controlled blood pressure such as heart disease, stroke, circulatory complications, vision complications, kidney impairment, sexual dysfunction;  Screening for signs and symptoms of depression related to chronic disease state;  Assessed social determinant of health barriers;  The patient is concerned about having to use a loner CPAP and the need for an upgrade to his equipment. RNCM spoke to the patient today about his OSA and other chronic concerns. Secure message sent to the pcp concerning the CPAP machine and [facility] needing additional information to process the CPAP order.   Symptom Management: Take medications as prescribed   Attend all scheduled provider appointments Call pharmacy for medication refills 3-7 days in advance of running out of medications Call provider office for new concerns or questions  call the Suicide and Crisis Lifeline: 988 call the [place] National Suicide Prevention Lifeline: (231)753-2087 or TTY: 309-638-2939 TTY 223-519-3950) to talk to a trained counselor call 1-800-273-TALK (toll free, 24 hour hotline) if experiencing a Mental Health or  Behavioral Health Crisis  check blood pressure weekly learn about high blood pressure call doctor for signs and symptoms of high blood pressure develop an action plan for high blood pressure keep all doctor appointments take medications for blood pressure exactly as prescribed report new symptoms to your doctor  Follow Up Plan: Telephone follow up appointment with care management team member scheduled for: 01-01-2022 at 1 pm       CCM: Assistance with obtaining CPAP       Care Coordination Interventions: Follow up outreach with [facility]. Representative will review faxed documents from 10/13-10/16 to confirm documentation for Mr. Oyen was received. Agreed to call back and contact patient if the agency did not receive the required documents. Of note, representative indicated that if the fax isn't working correctly he would discuss with documentation team to determine if documents can be scanned via email. [facility] email provided. Will update PCP if additional documentation is required. Call made back to [facility] at [phone] ext [phone], spoke to [facility]. Follow up with [facility] related to the call from the patients wife stating that [facility] still did not have the information they needed to process the order for the patients CPAP.  Charlotte Sanes states that she needs a note for use and benefits for the cpap that the notes from 08-29-2021 did not contain that information.  They also need an order for the cpap with pressure settings. Secure message sent to the pcp with information needed to be sent to [facility] so the order can be processed and the patient can get his replacement CPAP.  After collaboration with pcp the patient will see the  pcp on 11-25-2021 at 4 pm via video to complete the needed orders.  11-26-2021: Received a call from Gae Bon at [facility] requesting documents needed to process the order for the patients CPAP. Gae Bon also ask about compliance on behalf of the  patient. Review of PCP notes and read the notes. Explained that documents should have been faxed late yesterday or this am with updated office visit note and the order for a new CPAP. The patient does not need a new sleep study. The purpose of the visit was to get use and benefits and order for new CPAP as he is currently using a loaner as his CPAP stopped functioning properly. Gae Bon has not received the information yet but will look for it. Advised that the information was faxed to the attention of [facility]. Gae Bon will follow up and call the [facility] for any other needs. Collaboration with the pcp related to speaking with Gae Bon about paperwork needed for completion of the CPAP order. Will continue to monitor for new needs or changes.         Patient verbalizes understanding of instructions and care plan provided today and agrees to view in [place]. Active MyChart status and patient understanding of how to access instructions and care plan via MyChart confirmed with patient.     Telephone follow up appointment with care management team member scheduled for: 01-01-2022 at 1 pm

## 2021-11-26 NOTE — Chronic Care Management (AMB) (Signed)
Chronic Care Management   CCM RN Visit Note  11/26/2021 Name: Luke Mckee MRN: [id number] DOB: [birth year]  Subjective: Luke Mckee is a 81 y.o. year old male who is a primary care patient of Olin Hauser, DO. The patient was referred to the Chronic Care Management team for assistance with care management needs subsequent to provider initiation of CCM services and plan of care.    Today's Visit:  Collaboration with [place] and pcp  for  help with new CPAP machine and needed documentation  .        Goals Addressed             This Visit's Progress    CCM Expected Outcome:  Monitor, Self-Manage and Reduce Symptoms of Heart Failure       Current Barriers:  Knowledge Deficits related to effective management of HF Chronic Disease Management support and education needs related to effective management of HF  Planned Interventions: Basic overview and discussion of pathophysiology of Heart Failure reviewed Provided education on low sodium diet Reviewed Heart Failure Action Plan in depth and provided written copy Assessed need for readable accurate scales in home Provided education about placing scale on hard, flat surface Advised patient to weigh each morning after emptying bladder Discussed importance of daily weight and advised patient to weigh and record daily Reviewed role of diuretics in prevention of fluid overload and management of heart failure Discussed the importance of keeping all appointments with provider. Review with the patient and patients wife. Saw pcp by video for processing of CPAP orders on 11-25-2021 Provided patient with education about the role of exercise in the management of heart failure Advised patient to discuss changes in fluid balance or new concerns or questions related to heart failure with provider. The patient states he is stable. Did have questions about the quality of his sleep related to his chronic conditions and the need for an  updated CPAP. Education provided and follow up with [facility] for expressed needs. The patient denies any acute exacerbation of his heart failure at this time. Further information provided from the patients wife concerning the needed documents to complete the order for an upgraded CPAP. Needed paperwork was sent to [facility] after provider visit on 11-25-2021. The RNCM has spoke to [place], the representative at [facility] for updated information. Also collaboration with Dr. Raliegh Ip on information and talking with Gae Bon at [facility]. Gae Bon will let the RNCM know if there are new needs for documentation.   Symptom Management: Take medications as prescribed   Attend all scheduled provider appointments Call pharmacy for medication refills 3-7 days in advance of running out of medications Perform all self care activities independently  Perform IADL's (shopping, preparing meals, housekeeping, managing finances) independently Call provider office for new concerns or questions  call the Suicide and Crisis Lifeline: 988 call the [place] National Suicide Prevention Lifeline: 925 202 7935 or TTY: 2546224609 TTY 925-397-2142) to talk to a trained counselor call 1-800-273-TALK (toll free, 24 hour hotline) if experiencing a Mental Health or [place]   Follow Up Plan: Telephone follow up appointment with care management team member scheduled for: 01-01-2022 at 1 pm       CCM Expected Outcome:  Monitor, Self-Manage, and Reduce Symptoms of Hypertension       Current Barriers:  Knowledge Deficits related to effective management of HTN Chronic Disease Management support and education needs related to HTN and heart health BP Readings from Last 3 Encounters:  09/25/21 120/80  08/14/21 110/70  07/03/21 113/67     Planned Interventions: Evaluation of current treatment plan related to hypertension self management and patient's adherence to plan as established by provider;   Provided education  to patient re: stroke prevention, s/s of heart attack and stroke; Reviewed prescribed diet Heart Healthy Reviewed medications with patient and discussed importance of compliance;  Discussed plans with patient for ongoing care management follow up and provided patient with direct contact information for care management team; Advised patient, providing education and rationale, to monitor blood pressure daily and record, calling PCP for findings outside established parameters;  Reviewed scheduled/upcoming provider appointments including: 12-29-2021 with pcp, reminder given today. Had a video visit on 11-25-2021 to get updated information for Sleep apnea needs and order sent to [facility] for new replacement CPAP machine Advised patient to discuss changes in HTN or heart health with provider; Provided education on prescribed diet heart healthy;  Discussed complications of poorly controlled blood pressure such as heart disease, stroke, circulatory complications, vision complications, kidney impairment, sexual dysfunction;  Screening for signs and symptoms of depression related to chronic disease state;  Assessed social determinant of health barriers;  The patient is concerned about having to use a loner CPAP and the need for an upgrade to his equipment. RNCM spoke to the patient today about his OSA and other chronic concerns. Secure message sent to the pcp concerning the CPAP machine and [facility] needing additional information to process the CPAP order.   Symptom Management: Take medications as prescribed   Attend all scheduled provider appointments Call pharmacy for medication refills 3-7 days in advance of running out of medications Call provider office for new concerns or questions  call the Suicide and Crisis Lifeline: 988 call the [place] National Suicide Prevention Lifeline: (343)458-4545 or TTY: 364-565-5467 TTY 989 809 9209) to talk to a trained counselor call 1-800-273-TALK (toll free, 24 hour  hotline) if experiencing a Mental Health or [place]  check blood pressure weekly learn about high blood pressure call doctor for signs and symptoms of high blood pressure develop an action plan for high blood pressure keep all doctor appointments take medications for blood pressure exactly as prescribed report new symptoms to your doctor  Follow Up Plan: Telephone follow up appointment with care management team member scheduled for: 01-01-2022 at 1 pm       CCM: Assistance with obtaining CPAP       Care Coordination Interventions: Follow up outreach with [facility] [employer]. Representative will review faxed documents from 10/13-10/16 to confirm documentation for Luke Mckee was received. Agreed to call back and contact patient if the agency did not receive the required documents. Of note, representative indicated that if the fax isn't working correctly he would discuss with documentation team to determine if documents can be scanned via email. [place] email provided. Will update PCP if additional documentation is required. Call made back to [facility] at [phone] ext [phone], spoke to [facility]. Follow up with [facility] related to the call from the patients wife stating that [facility] still did not have the information they needed to process the order for the patients CPAP.  Overton Mam states that she needs a note for use and benefits for the cpap that the notes from 08-29-2021 did not contain that information.  They also need an order for the cpap with pressure settings. Secure message sent to the pcp with information needed to be sent to [facility] so the order can be processed and the  patient can get his replacement CPAP.  After collaboration with pcp the patient will see the pcp on 11-25-2021 at 4 pm via video to complete the needed orders.  11-26-2021: Received a call from Coralee North at [facility] requesting documents needed to process the order for the patients CPAP. Coralee North also  ask about compliance on behalf of the patient. Review of PCP notes and read the notes. Explained that documents should have been faxed late yesterday or this am with updated office visit note and the order for a new CPAP. The patient does not need a new sleep study. The purpose of the visit was to get use and benefits and order for new CPAP as he is currently using a loaner as his CPAP stopped functioning properly. Coralee North has not received the information yet but will look for it. Advised that the information was faxed to the attention of [facility]. Coralee North will follow up and call the [facility] for any other needs. Collaboration with the pcp related to speaking with Coralee North about paperwork needed for completion of the CPAP order. Will continue to monitor for new needs or changes.         Plan:Telephone follow up appointment with care management team member scheduled for:  01-01-2022 at 1 pm  Alto [place] RN, MSN, CCM RN Care Manager  Chronic Care Management Direct Number: [phone]

## 2021-11-27 DIAGNOSIS — M6281 Muscle weakness (generalized): Secondary | ICD-10-CM | POA: Diagnosis not present

## 2021-11-27 DIAGNOSIS — I639 Cerebral infarction, unspecified: Secondary | ICD-10-CM | POA: Diagnosis not present

## 2021-11-27 DIAGNOSIS — R269 Unspecified abnormalities of gait and mobility: Secondary | ICD-10-CM | POA: Diagnosis not present

## 2021-11-30 ENCOUNTER — Ambulatory Visit: Payer: Self-pay

## 2021-11-30 DIAGNOSIS — G4733 Obstructive sleep apnea (adult) (pediatric): Secondary | ICD-10-CM

## 2021-11-30 DIAGNOSIS — I5022 Chronic systolic (congestive) heart failure: Secondary | ICD-10-CM

## 2021-11-30 DIAGNOSIS — I10 Essential (primary) hypertension: Secondary | ICD-10-CM

## 2021-11-30 NOTE — Patient Instructions (Signed)
Please call the care guide team at [phone] if you need to cancel or reschedule your appointment.   If you are experiencing a Mental Health or Behavioral Health Crisis or need someone to talk to, please call the Suicide and Crisis Lifeline: 988 call the [place] National Suicide Prevention Lifeline: (240)865-6110 or TTY: 3075645337 TTY 819-406-9712) to talk to a trained counselor call 1-800-273-TALK (toll free, 24 hour hotline)   Following is a copy of your full provider care plan:   Goals Addressed             This Visit's Progress    CCM Expected Outcome:  Monitor, Self-Manage and Reduce Symptoms of Heart Failure       Current Barriers:  Knowledge Deficits related to effective management of HF Chronic Disease Management support and education needs related to effective management of HF  Planned Interventions: Basic overview and discussion of pathophysiology of Heart Failure reviewed Provided education on low sodium diet Reviewed Heart Failure Action Plan in depth and provided written copy Assessed need for readable accurate scales in home Provided education about placing scale on hard, flat surface Advised patient to weigh each morning after emptying bladder Discussed importance of daily weight and advised patient to weigh and record daily Reviewed role of diuretics in prevention of fluid overload and management of heart failure Discussed the importance of keeping all appointments with provider. Review with the patient and patients wife. Saw pcp by video for processing of CPAP orders on 11-25-2021 Provided patient with education about the role of exercise in the management of heart failure Advised patient to discuss changes in fluid balance or new concerns or questions related to heart failure with provider. The patient states he is stable. Did have questions about the quality of his sleep related to his chronic conditions and the need for an updated CPAP. Education provided and  follow up with [facility] for expressed needs. The patient denies any acute exacerbation of his heart failure at this time. Further information provided from the patients wife concerning the needed documents to complete the order for an upgraded CPAP. Needed paperwork was sent to [facility] after provider visit on 11-25-2021. The RNCM has spoke to [place], the representative at [facility] for updated information. Also collaboration with Dr. Kirtland Bouchard on information and talking with Coralee North at [facility]. Coralee North will let the RNCM know if there are new needs for documentation. New call from [facility]. Coralee North informed the [facility] that they have the needed paperwork to process the order for CPAP,  Symptom Management: Take medications as prescribed   Attend all scheduled provider appointments Call pharmacy for medication refills 3-7 days in advance of running out of medications Perform all self care activities independently  Perform IADL's (shopping, preparing meals, housekeeping, managing finances) independently Call provider office for new concerns or questions  call the Suicide and Crisis Lifeline: 988 call the [place] National Suicide Prevention Lifeline: 9842597590 or TTY: (215)593-5723 TTY (713) 338-6626) to talk to a trained counselor call 1-800-273-TALK (toll free, 24 hour hotline) if experiencing a Mental Health or Behavioral Health Crisis   Follow Up Plan: Telephone follow up appointment with care management team member scheduled for: 01-01-2022 at 1 pm       CCM Expected Outcome:  Monitor, Self-Manage, and Reduce Symptoms of Hypertension       Current Barriers:  Knowledge Deficits related to effective management of HTN Chronic Disease Management support and education needs related to HTN and heart health BP Readings from  Last 3 Encounters:  09/25/21 120/80  08/14/21 110/70  07/03/21 113/67     Planned Interventions: Evaluation of current treatment plan related to hypertension self management  and patient's adherence to plan as established by provider;   Provided education to patient re: stroke prevention, s/s of heart attack and stroke; Reviewed prescribed diet Heart Healthy Reviewed medications with patient and discussed importance of compliance;  Discussed plans with patient for ongoing care management follow up and provided patient with direct contact information for care management team; Advised patient, providing education and rationale, to monitor blood pressure daily and record, calling PCP for findings outside established parameters;  Reviewed scheduled/upcoming provider appointments including: 12-29-2021 with pcp, reminder given today. Had a video visit on 11-25-2021 to get updated information for Sleep apnea needs and order sent to [facility] for new replacement CPAP machine Advised patient to discuss changes in HTN or heart health with provider; Provided education on prescribed diet heart healthy;  Discussed complications of poorly controlled blood pressure such as heart disease, stroke, circulatory complications, vision complications, kidney impairment, sexual dysfunction;  Screening for signs and symptoms of depression related to chronic disease state;  Assessed social determinant of health barriers;  The patient is concerned about having to use a loner CPAP and the need for an upgrade to his equipment. RNCM spoke to the patient today about his OSA and other chronic concerns. Secure message sent to the pcp concerning the CPAP machine and [employer] needing additional information to process the CPAP order. CPAP orders are being processed today. Incoming call from [place] at [facility] called to discuss with RNCM.  Symptom Management: Take medications as prescribed   Attend all scheduled provider appointments Call pharmacy for medication refills 3-7 days in advance of running out of medications Call provider office for new concerns or questions  call the Suicide and Crisis Lifeline:  988 call the [place] National Suicide Prevention Lifeline: 417-888-2303 or TTY: 704-156-5252 TTY 6464844005) to talk to a trained counselor call 1-800-273-TALK (toll free, 24 hour hotline) if experiencing a Mental Health or Behavioral Health Crisis  check blood pressure weekly learn about high blood pressure call doctor for signs and symptoms of high blood pressure develop an action plan for high blood pressure keep all doctor appointments take medications for blood pressure exactly as prescribed report new symptoms to your doctor  Follow Up Plan: Telephone follow up appointment with care management team member scheduled for: 01-01-2022 at 1 pm       CCM: Assistance with obtaining CPAP       Care Coordination Interventions: Follow up outreach with [facility] [employer]. Representative will review faxed documents from 10/13-10/16 to confirm documentation for Mr. Kader was received. Agreed to call back and contact patient if the agency did not receive the required documents. Of note, representative indicated that if the fax isn't working correctly he would discuss with documentation team to determine if documents can be scanned via email. [facility] email provided. Will update PCP if additional documentation is required. Call made back to [facility] at [phone] ext [phone], spoke to [facility]. Follow up with [facility] related to the call from the patients wife stating that [employer] still did not have the information they needed to process the order for the patients CPAP.  Charlotte Sanes states that she needs a note for use and benefits for the cpap that the notes from 08-29-2021 did not contain that information.  They also need an order for the cpap with pressure settings. Secure  message sent to the pcp with information needed to be sent to [employer] so the order can be processed and the patient can get his replacement CPAP.  After collaboration with pcp the patient will see the pcp on 11-25-2021 at 4  pm via video to complete the needed orders.  11-26-2021: Received a call from Coralee North at [facility] requesting documents needed to process the order for the patients CPAP. Coralee North also ask about compliance on behalf of the patient. Review of PCP notes and read the notes. Explained that documents should have been faxed late yesterday or this am with updated office visit note and the order for a new CPAP. The patient does not need a new sleep study. The purpose of the visit was to get use and benefits and order for new CPAP as he is currently using a loaner as his CPAP stopped functioning properly. Coralee North has not received the information yet but will look for it. Advised that the information was faxed to the attention of [facility]. Coralee North will follow up and call the [facility] for any other needs. Collaboration with the pcp related to speaking with Coralee North about paperwork needed for completion of the CPAP order. Will continue to monitor for new needs or changes.  11-30-2021: [facility] called and stated that they did not receive the paperwork for the CPAP machine. Collaboration with the pcp and the paperwork was faxed last week. Incoming call back from [place] letting the [facility] know that the paperwork has been received and they are moving forward with the processing the order for a cpap machine. Let the pcp know that they do have the paperwork to process the order. Will continue to monitor.         Patient verbalizes understanding of instructions and care plan provided today and agrees to view in MyChart. Active MyChart status and patient understanding of how to access instructions and care plan via MyChart confirmed with patient.     Telephone follow up appointment with care management team member scheduled for: 01-01-2022

## 2021-11-30 NOTE — Chronic Care Management (AMB) (Signed)
Chronic Care Management   CCM RN Visit Note  11/30/2021 Name: Luke Mckee MRN: [id number] DOB: 1940-08-27  Subjective: Luke Mckee is a 81 y.o. year old male who is a primary care patient of [name], DO. The patient was referred to the Chronic Care Management team for assistance with care management needs subsequent to provider initiation of CCM services and plan of care.    Today's Visit:  Collaboration with [facility] and Dr. Lew Dawes  for  CPAP needed information .        Goals Addressed             This Visit's Progress    CCM Expected Outcome:  Monitor, Self-Manage and Reduce Symptoms of Heart Failure       Current Barriers:  Knowledge Deficits related to effective management of HF Chronic Disease Management support and education needs related to effective management of HF  Planned Interventions: Basic overview and discussion of pathophysiology of Heart Failure reviewed Provided education on low sodium diet Reviewed Heart Failure Action Plan in depth and provided written copy Assessed need for readable accurate scales in home Provided education about placing scale on hard, flat surface Advised patient to weigh each morning after emptying bladder Discussed importance of daily weight and advised patient to weigh and record daily Reviewed role of diuretics in prevention of fluid overload and management of heart failure Discussed the importance of keeping all appointments with provider. Review with the patient and patients wife. Saw pcp by video for processing of CPAP orders on 11-25-2021 Provided patient with education about the role of exercise in the management of heart failure Advised patient to discuss changes in fluid balance or new concerns or questions related to heart failure with provider. The patient states he is stable. Did have questions about the quality of his sleep related to his chronic conditions and the need for an updated CPAP.  Education provided and follow up with [facility] for expressed needs. The patient denies any acute exacerbation of his heart failure at this time. Further information provided from the patients wife concerning the needed documents to complete the order for an upgraded CPAP. Needed paperwork was sent to [facility] after provider visit on 11-25-2021. The RNCM has spoke to [place], the representative at [facility] for updated information. Also collaboration with Dr. Kirtland Bouchard on information and talking with Coralee North at [facility]. Coralee North will let the RNCM know if there are new needs for documentation. New call from [facility]. Coralee North informed the [facility] that they have the needed paperwork to process the order for CPAP,  Symptom Management: Take medications as prescribed   Attend all scheduled provider appointments Call pharmacy for medication refills 3-7 days in advance of running out of medications Perform all self care activities independently  Perform IADL's (shopping, preparing meals, housekeeping, managing finances) independently Call provider office for new concerns or questions  call the Suicide and Crisis Lifeline: 988 call the [place] National Suicide Prevention Lifeline: 9367290388 or TTY: 5676583108 TTY 360-457-0052) to talk to a trained counselor call 1-800-273-TALK (toll free, 24 hour hotline) if experiencing a Mental Health or Behavioral Health Crisis   Follow Up Plan: Telephone follow up appointment with care management team member scheduled for: 01-01-2022 at 1 pm       CCM Expected Outcome:  Monitor, Self-Manage, and Reduce Symptoms of Hypertension       Current Barriers:  Knowledge Deficits related to effective management of HTN Chronic Disease Management support and  education needs related to HTN and heart health BP Readings from Last 3 Encounters:  09/25/21 120/80  08/14/21 110/70  07/03/21 113/67     Planned Interventions: Evaluation of current treatment plan related to  hypertension self management and patient's adherence to plan as established by provider;   Provided education to patient re: stroke prevention, s/s of heart attack and stroke; Reviewed prescribed diet Heart Healthy Reviewed medications with patient and discussed importance of compliance;  Discussed plans with patient for ongoing care management follow up and provided patient with direct contact information for care management team; Advised patient, providing education and rationale, to monitor blood pressure daily and record, calling PCP for findings outside established parameters;  Reviewed scheduled/upcoming provider appointments including: 12-29-2021 with pcp, reminder given today. Had a video visit on 11-25-2021 to get updated information for Sleep apnea needs and order sent to [facility] for new replacement CPAP machine Advised patient to discuss changes in HTN or heart health with provider; Provided education on prescribed diet heart healthy;  Discussed complications of poorly controlled blood pressure such as heart disease, stroke, circulatory complications, vision complications, kidney impairment, sexual dysfunction;  Screening for signs and symptoms of depression related to chronic disease state;  Assessed social determinant of health barriers;  The patient is concerned about having to use a loner CPAP and the need for an upgrade to his equipment. RNCM spoke to the patient today about his OSA and other chronic concerns. Secure message sent to the pcp concerning the CPAP machine and [facility] needing additional information to process the CPAP order. CPAP orders are being processed today. Incoming call from [place] at [facility] called to discuss with RNCM.  Symptom Management: Take medications as prescribed   Attend all scheduled provider appointments Call pharmacy for medication refills 3-7 days in advance of running out of medications Call provider office for new concerns or questions  call the  Suicide and Crisis Lifeline: 988 call the [place] National Suicide Prevention Lifeline: 934 225 8213 or TTY: 8675543621 TTY 212-353-5986) to talk to a trained counselor call 1-800-273-TALK (toll free, 24 hour hotline) if experiencing a Mental Health or Behavioral Health Crisis  check blood pressure weekly learn about high blood pressure call doctor for signs and symptoms of high blood pressure develop an action plan for high blood pressure keep all doctor appointments take medications for blood pressure exactly as prescribed report new symptoms to your doctor  Follow Up Plan: Telephone follow up appointment with care management team member scheduled for: 01-01-2022 at 1 pm       CCM: Assistance with obtaining CPAP       Care Coordination Interventions: Follow up outreach with [facility]. Representative will review faxed documents from 10/13-10/16 to confirm documentation for Mr. Kue was received. Agreed to call back and contact patient if the agency did not receive the required documents. Of note, representative indicated that if the fax isn't working correctly he would discuss with documentation team to determine if documents can be scanned via email. [facility] email provided. Will update PCP if additional documentation is required. Call made back to [facility] at [phone] ext [phone], spoke to [facility]. Follow up with [facility] related to the call from the patients wife stating that [facility] still did not have the information they needed to process the order for the patients CPAP.  Charlotte Sanes states that she needs a note for use and benefits for the cpap that the notes from 08-29-2021 did not contain that information.  They  also need an order for the cpap with pressure settings. Secure message sent to the pcp with information needed to be sent to [facility] so the order can be processed and the patient can get his replacement CPAP.  After collaboration with pcp the patient will  see the pcp on 11-25-2021 at 4 pm via video to complete the needed orders.  11-26-2021: Received a call from Coralee North at [facility] requesting documents needed to process the order for the patients CPAP. Coralee North also ask about compliance on behalf of the patient. Review of PCP notes and read the notes. Explained that documents should have been faxed late yesterday or this am with updated office visit note and the order for a new CPAP. The patient does not need a new sleep study. The purpose of the visit was to get use and benefits and order for new CPAP as he is currently using a loaner as his CPAP stopped functioning properly. Coralee North has not received the information yet but will look for it. Advised that the information was faxed to the attention of [facility]. Coralee North will follow up and call the [facility] for any other needs. Collaboration with the pcp related to speaking with Coralee North about paperwork needed for completion of the CPAP order. Will continue to monitor for new needs or changes.  11-30-2021: [facility] called and stated that they did not receive the paperwork for the CPAP machine. Collaboration with the pcp and the paperwork was faxed last week. Incoming call back from [place]bie letting the [facility] know that the paperwork has been received and they are moving forward with the processing the order for a cpap machine. Let the pcp know that they do have the paperwork to process the order. Will continue to monitor.         Plan:Telephone follow up appointment with care management team member scheduled for:  01-01-2022  Alto Denver RN, MSN, CCM RN Care Manager  Chronic Care Management Direct Number: [phone]

## 2021-12-10 DIAGNOSIS — I5020 Unspecified systolic (congestive) heart failure: Secondary | ICD-10-CM

## 2021-12-10 DIAGNOSIS — I110 Hypertensive heart disease with heart failure: Secondary | ICD-10-CM | POA: Diagnosis not present

## 2021-12-21 ENCOUNTER — Other Ambulatory Visit: Payer: Self-pay

## 2021-12-21 DIAGNOSIS — I5022 Chronic systolic (congestive) heart failure: Secondary | ICD-10-CM

## 2021-12-21 DIAGNOSIS — R351 Nocturia: Secondary | ICD-10-CM

## 2021-12-21 DIAGNOSIS — E039 Hypothyroidism, unspecified: Secondary | ICD-10-CM

## 2021-12-21 DIAGNOSIS — Z0000 Encounter for general adult medical examination without abnormal findings: Secondary | ICD-10-CM

## 2021-12-21 DIAGNOSIS — I10 Essential (primary) hypertension: Secondary | ICD-10-CM

## 2021-12-21 DIAGNOSIS — R7303 Prediabetes: Secondary | ICD-10-CM

## 2021-12-21 DIAGNOSIS — E782 Mixed hyperlipidemia: Secondary | ICD-10-CM

## 2021-12-22 ENCOUNTER — Other Ambulatory Visit: Payer: Medicare PPO

## 2021-12-22 DIAGNOSIS — E039 Hypothyroidism, unspecified: Secondary | ICD-10-CM | POA: Diagnosis not present

## 2021-12-22 DIAGNOSIS — E782 Mixed hyperlipidemia: Secondary | ICD-10-CM | POA: Diagnosis not present

## 2021-12-22 DIAGNOSIS — I5022 Chronic systolic (congestive) heart failure: Secondary | ICD-10-CM | POA: Diagnosis not present

## 2021-12-22 DIAGNOSIS — I10 Essential (primary) hypertension: Secondary | ICD-10-CM | POA: Diagnosis not present

## 2021-12-22 DIAGNOSIS — R351 Nocturia: Secondary | ICD-10-CM | POA: Diagnosis not present

## 2021-12-22 DIAGNOSIS — R7303 Prediabetes: Secondary | ICD-10-CM | POA: Diagnosis not present

## 2021-12-22 DIAGNOSIS — Z0000 Encounter for general adult medical examination without abnormal findings: Secondary | ICD-10-CM | POA: Diagnosis not present

## 2021-12-23 LAB — CBC WITH DIFFERENTIAL/PLATELET
Absolute Monocytes: 554 cells/uL (ref 200–950)
Basophils Absolute: 78 cells/uL (ref 0–200)
Basophils Relative: 1.1 %
Eosinophils Absolute: 249 cells/uL (ref 15–500)
Eosinophils Relative: 3.5 %
HCT: 39.4 % (ref 38.5–50.0)
Hemoglobin: 13.2 g/dL (ref 13.2–17.1)
Lymphs Abs: 1683 cells/uL (ref 850–3900)
MCH: 32.1 pg (ref 27.0–33.0)
MCHC: 33.5 g/dL (ref 32.0–36.0)
MCV: 95.9 fL (ref 80.0–100.0)
MPV: 9.4 fL (ref 7.5–12.5)
Monocytes Relative: 7.8 %
Neutro Abs: 4537 cells/uL (ref 1500–7800)
Neutrophils Relative %: 63.9 %
Platelets: 227 10*3/uL (ref 140–400)
RBC: 4.11 10*6/uL — ABNORMAL LOW (ref 4.20–5.80)
RDW: 12.8 % (ref 11.0–15.0)
Total Lymphocyte: 23.7 %
WBC: 7.1 10*3/uL (ref 3.8–10.8)

## 2021-12-23 LAB — COMPLETE METABOLIC PANEL WITH GFR
AG Ratio: 1.3 (calc) (ref 1.0–2.5)
ALT: 12 U/L (ref 9–46)
AST: 15 U/L (ref 10–35)
Albumin: 4 g/dL (ref 3.6–5.1)
Alkaline phosphatase (APISO): 49 U/L (ref 35–144)
BUN: 13 mg/dL (ref 7–25)
CO2: 30 mmol/L (ref 20–32)
Calcium: 9.2 mg/dL (ref 8.6–10.3)
Chloride: 103 mmol/L (ref 98–110)
Creat: 0.95 mg/dL (ref 0.70–1.22)
Globulin: 3.1 g/dL (calc) (ref 1.9–3.7)
Glucose, Bld: 106 mg/dL — ABNORMAL HIGH (ref 65–99)
Potassium: 3.8 mmol/L (ref 3.5–5.3)
Sodium: 143 mmol/L (ref 135–146)
Total Bilirubin: 0.5 mg/dL (ref 0.2–1.2)
Total Protein: 7.1 g/dL (ref 6.1–8.1)
eGFR: 81 mL/min/{1.73_m2} (ref >=60)

## 2021-12-23 LAB — LIPID PANEL
Cholesterol: 127 mg/dL (ref <200)
HDL: 42 mg/dL (ref >=40)
LDL Cholesterol (Calc): 62 mg/dL (calc)
Non-HDL Cholesterol (Calc): 85 mg/dL (calc) (ref <130)
Total CHOL/HDL Ratio: 3 (calc) (ref <5.0)
Triglycerides: 143 mg/dL (ref <150)

## 2021-12-23 LAB — TSH: TSH: 3.67 mIU/L (ref 0.40–4.50)

## 2021-12-23 LAB — HEMOGLOBIN A1C
Hgb A1c MFr Bld: 6.2 % of total Hgb — ABNORMAL HIGH (ref <5.7)
Mean Plasma Glucose: 131 mg/dL
eAG (mmol/L): 7.3 mmol/L

## 2021-12-23 LAB — T4, FREE: Free T4: 1.2 ng/dL (ref 0.8–1.8)

## 2021-12-23 LAB — PSA: PSA: 0.92 ng/mL (ref <=4.00)

## 2021-12-29 ENCOUNTER — Encounter: Payer: Self-pay | Admitting: Family Medicine

## 2021-12-29 ENCOUNTER — Ambulatory Visit (INDEPENDENT_AMBULATORY_CARE_PROVIDER_SITE_OTHER): Payer: Medicare PPO | Admitting: Family Medicine

## 2021-12-29 VITALS — BP 128/66 | HR 73 | Ht 68.0 in | Wt 229.0 lb

## 2021-12-29 DIAGNOSIS — Z0000 Encounter for general adult medical examination without abnormal findings: Secondary | ICD-10-CM

## 2021-12-29 DIAGNOSIS — J0110 Acute frontal sinusitis, unspecified: Secondary | ICD-10-CM

## 2021-12-29 DIAGNOSIS — I25119 Atherosclerotic heart disease of native coronary artery with unspecified angina pectoris: Secondary | ICD-10-CM

## 2021-12-29 DIAGNOSIS — R7303 Prediabetes: Secondary | ICD-10-CM | POA: Diagnosis not present

## 2021-12-29 DIAGNOSIS — I10 Essential (primary) hypertension: Secondary | ICD-10-CM | POA: Diagnosis not present

## 2021-12-29 DIAGNOSIS — E782 Mixed hyperlipidemia: Secondary | ICD-10-CM

## 2021-12-29 DIAGNOSIS — K429 Umbilical hernia without obstruction or gangrene: Secondary | ICD-10-CM

## 2021-12-29 DIAGNOSIS — I6930 Unspecified sequelae of cerebral infarction: Secondary | ICD-10-CM | POA: Diagnosis not present

## 2021-12-29 DIAGNOSIS — E039 Hypothyroidism, unspecified: Secondary | ICD-10-CM | POA: Diagnosis not present

## 2021-12-29 DIAGNOSIS — I5022 Chronic systolic (congestive) heart failure: Secondary | ICD-10-CM

## 2021-12-29 DIAGNOSIS — G4733 Obstructive sleep apnea (adult) (pediatric): Secondary | ICD-10-CM

## 2021-12-29 DIAGNOSIS — E669 Obesity, unspecified: Secondary | ICD-10-CM

## 2021-12-29 DIAGNOSIS — G8191 Hemiplegia, unspecified affecting right dominant side: Secondary | ICD-10-CM

## 2021-12-29 DIAGNOSIS — I679 Cerebrovascular disease, unspecified: Secondary | ICD-10-CM

## 2021-12-29 MED ORDER — AZITHROMYCIN 250 MG PO TABS: ORAL_TABLET | ORAL | 0 refills | Status: DC

## 2021-12-29 NOTE — Assessment & Plan Note (Signed)
Controlled cholesterol on statin and lifestyle Last lipid panel 12/2021 CAD, history CVA  Plan: 1. Continue current meds - Atorvastatin 80mg  2. DAPT 2ndary ASCVD risk reduction 3. Encourage improved lifestyle - low carb/cholesterol, reduce portion size, continue improving regular exercise

## 2021-12-29 NOTE — Assessment & Plan Note (Signed)
Controlled Continue on Levothyroxine daily

## 2021-12-29 NOTE — Patient Instructions (Addendum)
Thank you for coming to the office today.  IF not improved from sinus and respiratory symptoms May try back up plan antibiotic - Start Azithromycin Z pak (antibiotic) 2 tabs day 1, then 1 tab x 4 days, complete entire course even if improved  No change to medications.  Umbilical hernia appears mostly normal or benign it can be episodic swelling and as long as it does not cause pain and not return into the abdomen, then you are good. If it is worsening or painful you can follow-up.  Please schedule a Follow-up Appointment to: Return in about 6 months (around 06/30/2022) for 6 month follow-up PreDM A1c, HTN heart updates.  If you have any other questions or concerns, please feel free to call the office or send a message through MyChart. You may also schedule an earlier appointment if necessary.  Additionally, you may be receiving a survey about your experience at our office within a few days to 1 week by e-mail or mail. We value your feedback.  Saralyn Pilar, DO [facility], [place]

## 2021-12-29 NOTE — Progress Notes (Signed)
Subjective:    Patient ID: Luke Mckee, male    DOB: [birth year], 81 y.o.   MRN: [id number]  Jag Lenz is a 81 y.o. male presenting on 12/29/2021 for Annual Exam   HPI  Here for Annual Physical and Lab Review  Sinusitis Recently onset 5 days with sinus drainage, no fever chills body aches Tried OTC medication Netti pot with improved Today feeling much better  History of CVA with residual deficit R sided weakness / multiple L MCA / TIA history HYPERTENSION Hyperlipidemia CAD s/p CABG with stable angina on nitrate  Followed by Neuro/Cards, see prior notes for background information. S/p loop recorder. No identified AFib. He had issue with stroke on Eliquis. Now he is back on DAPT Plavix + ASA 40m and doing better - Low BP by report, still taking diuretics due to edema per Cardiology - lasix 80 BID He has been taken off Imdur Isosorbide 334mdaily, it was filled again but they're trying to discontinue it. Cardiology has stopped it Lipid panel is normal range. He has occasional dizziness mostly postural or movement related. Intermittent not consistent, not linked to low BP Has missed dose Lasix occasionally Upcoming visit with Cards prior to cataract surgery Denies any chest pain, new focal weakness, dyspnea, worsening edema, near syncope or syncope   Elevated A1c / PreDM Morbid Obesity BMI >37 Last result A1c 6.2 12/2020, stable range 6.1 to 6.3 Last lab today showed A1c 6.2, previously was at 6.1   Hypothyroidism Chronic problem Last lab now 12/2021 showed normal Thyroid panel and T4 He is taking Levothyroxine 12516mdaily, currently doing well - with some improvement after med adjustment of changing dosing so does not take other meds with levothyroxine.   Anxiety / Depression recurrent in partial remission - Last visit with me 04/2020, for same problem anxiety, treated with dose increase Sertraline from 150 to 200m13mee prior notes for background information. -  Interval update with improvement. - Today patient reports now taking Sertraline 100mg38m = 200mg 21mdoing better, also has Lorazepam 0.5mg as36mPRN medication , needs refill   Feels more sedentary but goals to improve activity.   Psychological Insomnia Failed Ambien Improved on Trazodone nightly. On Sertraline    OSA on CPAP Followed by Pulmonology, no repeat sleep study - Today reports that sleep apnea is well controlled. He uses the CPAP machine every night. Tolerates the machine well, and thinks that sleeps better with it and feels good. No new concerns or symptoms.  Dentist recommendation for dry mouth Tried Children's Benadryl and Peptobismol with good results, 5mL mix67m spit out residue and rinse. Tried Biotene but more expensive  Umbilical Hernia Chronic problem, recently noticed increased swelling. Not causing pain or pressure.  Health Maintenance: Considered RSV vaccine but did not get it yet.  Updated all other vaccines.  PSA 0.92 (12/2021)     12/29/2021    2:48 PM 09/25/2021    2:41 PM 07/03/2021    3:37 PM  Depression screen PHQ 2/9  Decreased Interest 1 0   Down, Depressed, Hopeless 2 1 0  PHQ - 2 Score 3 1 0  Altered sleeping 2 0 0  Tired, decreased energy 3 1 0  Change in appetite 1 0 0  Feeling bad or failure about yourself  1 0 0  Trouble concentrating 3 0 0  Moving slowly or fidgety/restless 1 0 0  Suicidal thoughts 0 0 0  PHQ-9 Score 14 2 0  Difficult doing  work/chores Somewhat difficult Not difficult at all Not difficult at all    Past Medical History:  Diagnosis Date   Anxiety    Arthritis    CAD (coronary artery disease) 2005   s/p CABG, DES   CHF (congestive heart failure) (HCC)    in EPIC care everywhere   Chronic kidney disease    COPD (chronic obstructive pulmonary disease) (HCC)    in EPIC care everywhere   Depression    Heart murmur    History of stroke    Hypothyroidism    OSA (obstructive sleep apnea)    Paroxysmal A-fib  ([place])    in EPIC careeverywhere   Past Surgical History:  Procedure Laterality Date   APPENDECTOMY     BUNIONECTOMY     CATARACT EXTRACTION     Right eye   CATARACT EXTRACTION W/PHACO Left 01/19/2021   Procedure: CATARACT EXTRACTION PHACO AND INTRAOCULAR LENS PLACEMENT (IOC) LEFT 3.09 00:28.3;  Surgeon: Eulogio Bear, MD;  Location: [facility];  Service: Ophthalmology;  Laterality: Left;   COLONOSCOPY  07/06/2004   CORONARY ARTERY BYPASS GRAFT  2005   LOOP RECORDER INSERTION N/A 07/25/2018   Procedure: LOOP RECORDER INSERTION;  Surgeon: Constance Haw, MD;  Location: [facility];  Service: Cardiovascular;  Laterality: N/A;   TOTAL HIP ARTHROPLASTY Right 11/23/2011   TOTAL HIP ARTHROPLASTY Left 09/07/2011   TOTAL KNEE ARTHROPLASTY Right 02/15/2018   VASECTOMY  1978   Social History   Socioeconomic History   Marital status: Married    Spouse name: Not on file   Number of children: Not on file   Years of education: Not on file   Highest education level: Not on file  Occupational History   Occupation: retired  Tobacco Use   Smoking status: Never   Smokeless tobacco: Never  Vaping Use   Vaping Use: Never used  Substance and Sexual Activity   Alcohol use: Not Currently    Comment: past   Drug use: Never   Sexual activity: Not on file  Other Topics Concern   Not on file  Social History Narrative   Not on file   Social Determinants of Health   Financial Resource Strain: [place]  (09/25/2021)   Overall Financial Resource Strain (CARDIA)    Difficulty of Paying Living Expenses: Not hard at all  Food Insecurity: No Food Insecurity (09/25/2021)   Hunger Vital Sign    Worried About Running Out of Food in the Last Year: Never true    [place] in the Last Year: Never true  Transportation Needs: No Transportation Needs (11/03/2021)   PRAPARE - Hydrologist (Medical): No    Lack of Transportation (Non-Medical): No   Physical Activity: Insufficiently Active (09/25/2021)   Exercise Vital Sign    Days of Exercise per Week: 2 days    Minutes of Exercise per Session: 60 min  Stress: No Stress Concern Present (09/25/2021)   [place]    Feeling of Stress : Not at all  Social Connections: Moderately Integrated (09/25/2021)   Social Connection and Isolation Panel [NHANES]    Frequency of Communication with Friends and Family: More than three times a week    Frequency of Social Gatherings with Friends and Family: More than three times a week    Attends Religious Services: More than 4 times per year    Active Member of [employer] or Organizations:  No    Attends Club or Organization Meetings: Never    Marital Status: Married  Human resources officer Violence: Not At Risk (09/25/2021)   Humiliation, Afraid, Rape, and Kick questionnaire    Fear of Current or Ex-Partner: No    Emotionally Abused: No    Physically Abused: No    Sexually Abused: No   Family History  Problem Relation Age of Onset   Anxiety disorder Sister    Current Outpatient Medications on File Prior to Visit  Medication Sig   acetaminophen (TYLENOL) 650 MG CR tablet Take 1,300 mg by mouth at bedtime as needed.   albuterol (VENTOLIN HFA) 108 (90 Base) MCG/ACT inhaler Inhale 1-2 puffs into the lungs every 4 (four) hours as needed for wheezing or shortness of breath (cough).   aspirin EC 81 MG tablet Take 81 mg by mouth daily.   atenolol (TENORMIN) 50 MG tablet TAKE 1 TABLET(50 MG) BY MOUTH AT BEDTIME (Patient taking differently: Take 50 mg by mouth daily.)   atorvastatin (LIPITOR) 80 MG tablet TAKE 1 TABLET(80 MG) BY MOUTH DAILY   Calcium Carb-Cholecalciferol (CALCIUM 600 + D PO) Take 1 tablet by mouth daily.   Cholecalciferol (VITAMIN D) 50 MCG (2000 UT) tablet Take 4,000 Units by mouth daily.   clopidogrel (PLAVIX) 75 MG tablet Take 1 tablet (75 mg total) by mouth daily.    clotrimazole-betamethasone (LOTRISONE) cream Apply twice a day for 1-2 weeks, may repeat if need in future   co-enzyme Q-10 30 MG capsule Take 100 mg by mouth daily.   diclofenac sodium (VOLTAREN) 1 % GEL Apply 2 g topically 4 (four) times daily as needed for pain. Foot pain   furosemide (LASIX) 80 MG tablet Take 1 tablet (80 mg total) by mouth 2 (two) times daily.   ipratropium (ATROVENT) 0.06 % nasal spray Place 2 sprays into both nostrils 4 (four) times daily as needed for rhinitis.   levothyroxine (SYNTHROID) 125 MCG tablet TAKE 1 TABLET(125 MCG) BY MOUTH DAILY BEFORE BREAKFAST   loratadine (CLARITIN) 10 MG tablet Take 10 mg by mouth daily.   LORazepam (ATIVAN) 0.5 MG tablet Take 1 tablet (0.5 mg total) by mouth daily as needed for anxiety or sleep.   montelukast (SINGULAIR) 10 MG tablet Take 1 tablet (10 mg total) by mouth at bedtime.   Multiple Vitamin (MULTIVITAMIN) tablet Take 1 tablet by mouth daily.   nitroGLYCERIN (NITROSTAT) 0.4 MG SL tablet Place 1 tablet (0.4 mg total) under the tongue every 5 (five) minutes as needed for chest pain.   PATADAY 0.1 % ophthalmic solution Place into both eyes as needed.    sennosides-docusate sodium (SENOKOT-S) 8.6-50 MG tablet Take 1-2 tablets by mouth daily as needed for constipation.   sertraline (ZOLOFT) 100 MG tablet Take 2 tablets (200 mg total) by mouth daily.   spironolactone (ALDACTONE) 25 MG tablet Take 0.5 tablets (12.5 mg total) by mouth daily.   traZODone (DESYREL) 50 MG tablet TAKE 1 TABLET(50 MG) BY MOUTH AT BEDTIME   No current facility-administered medications on file prior to visit.    Review of Systems  Constitutional:  Negative for activity change, appetite change, chills, diaphoresis, fatigue and fever.  HENT:  Negative for congestion and hearing loss.   Eyes:  Negative for visual disturbance.  Respiratory:  Negative for cough, chest tightness, shortness of breath and wheezing.   Cardiovascular:  Negative for chest pain,  palpitations and leg swelling.  Gastrointestinal:  Negative for abdominal pain, constipation, diarrhea, nausea and vomiting.  Genitourinary:  Negative for dysuria, frequency and hematuria.  Musculoskeletal:  Negative for arthralgias and neck pain.  Skin:  Negative for rash.  Neurological:  Negative for dizziness, weakness, light-headedness, numbness and headaches.  Hematological:  Negative for adenopathy.  Psychiatric/Behavioral:  Negative for behavioral problems, dysphoric mood and sleep disturbance.    Per HPI unless specifically indicated above      Objective:    BP 128/66   Pulse 73   Ht 5' 8" (1.727 m)   Wt 229 lb (103.9 kg)   SpO2 97%   BMI 34.82 kg/m   Wt Readings from Last 3 Encounters:  12/29/21 229 lb (103.9 kg)  11/25/21 239 lb (108.4 kg)  09/25/21 239 lb (108.4 kg)    Physical Exam Vitals and nursing note reviewed.  Constitutional:      General: He is not in acute distress.    Appearance: He is well-developed. He is obese. He is not diaphoretic.     Comments: Well-appearing, comfortable, cooperative  HENT:     Head: Normocephalic and atraumatic.  Eyes:     General:        Right eye: No discharge.        Left eye: No discharge.     Conjunctiva/sclera: Conjunctivae normal.     Pupils: Pupils are equal, round, and reactive to light.  Neck:     Thyroid: No thyromegaly.  Cardiovascular:     Rate and Rhythm: Normal rate and regular rhythm.     Pulses: Normal pulses.     Heart sounds: Normal heart sounds. No murmur heard. Pulmonary:     Effort: Pulmonary effort is normal. No respiratory distress.     Breath sounds: Normal breath sounds. No wheezing or rales.  Abdominal:     General: Bowel sounds are normal. There is no distension.     Palpations: Abdomen is soft. There is no mass.     Tenderness: There is no abdominal tenderness.     Hernia: A hernia (umbilical hernia) is present.  Musculoskeletal:        General: No tenderness. Normal range of motion.      Cervical back: Normal range of motion and neck supple.     Comments: Upper / Lower Extremities: - Normal muscle tone, strength bilateral upper extremities 5/5, lower extremities 5/5  Lymphadenopathy:     Cervical: No cervical adenopathy.  Skin:    General: Skin is warm and dry.     Findings: No erythema or rash.  Neurological:     Mental Status: He is alert and oriented to person, place, and time.     Comments: Distal sensation intact to light touch all extremities  Psychiatric:        Mood and Affect: Mood normal.        Behavior: Behavior normal.        Thought Content: Thought content normal.     Comments: Well groomed, good eye contact, normal speech and thoughts       Results for orders placed or performed in visit on 12/21/21  T4, free  Result Value Ref Range   Free T4 1.2 0.8 - 1.8 ng/dL  TSH  Result Value Ref Range   TSH 3.67 0.40 - 4.50 mIU/L  PSA  Result Value Ref Range   PSA 0.92 < OR = 4.00 ng/mL  Hemoglobin A1c  Result Value Ref Range   Hgb A1c MFr Bld 6.2 (H) <5.7 % of total Hgb   Mean Plasma Glucose 131 mg/dL  eAG (mmol/L) 7.3 mmol/L  Lipid panel  Result Value Ref Range   Cholesterol 127 <200 mg/dL   HDL 42 > OR = 40 mg/dL   Triglycerides 143 <150 mg/dL   LDL Cholesterol (Calc) 62 mg/dL (calc)   Total CHOL/HDL Ratio 3.0 <5.0 (calc)   Non-HDL Cholesterol (Calc) 85 <130 mg/dL (calc)  CBC with Differential/Platelet  Result Value Ref Range   WBC 7.1 3.8 - 10.8 Thousand/uL   RBC 4.11 (L) 4.20 - 5.80 Million/uL   Hemoglobin 13.2 13.2 - 17.1 g/dL   HCT 39.4 38.5 - 50.0 %   MCV 95.9 80.0 - 100.0 fL   MCH 32.1 27.0 - 33.0 pg   MCHC 33.5 32.0 - 36.0 g/dL   RDW 12.8 11.0 - 15.0 %   Platelets 227 140 - 400 Thousand/uL   MPV 9.4 7.5 - 12.5 fL   Neutro Abs 4,537 1,500 - 7,800 cells/uL   Lymphs Abs 1,683 850 - 3,900 cells/uL   Absolute Monocytes 554 200 - 950 cells/uL   Eosinophils Absolute 249 15 - 500 cells/uL   Basophils Absolute 78 0 - 200 cells/uL    Neutrophils Relative % 63.9 %   Total Lymphocyte 23.7 %   Monocytes Relative 7.8 %   Eosinophils Relative 3.5 %   Basophils Relative 1.1 %  COMPLETE METABOLIC PANEL WITH GFR  Result Value Ref Range   Glucose, Bld 106 (H) 65 - 99 mg/dL   BUN 13 7 - 25 mg/dL   Creat 0.95 0.70 - 1.22 mg/dL   eGFR 81 > OR = 60 mL/min/1.88m   BUN/Creatinine Ratio SEE NOTE: 6 - 22 (calc)   Sodium 143 135 - 146 mmol/L   Potassium 3.8 3.5 - 5.3 mmol/L   Chloride 103 98 - 110 mmol/L   CO2 30 20 - 32 mmol/L   Calcium 9.2 8.6 - 10.3 mg/dL   Total Protein 7.1 6.1 - 8.1 g/dL   Albumin 4.0 3.6 - 5.1 g/dL   Globulin 3.1 1.9 - 3.7 g/dL (calc)   AG Ratio 1.3 1.0 - 2.5 (calc)   Total Bilirubin 0.5 0.2 - 1.2 mg/dL   Alkaline phosphatase (APISO) 49 35 - 144 U/L   AST 15 10 - 35 U/L   ALT 12 9 - 46 U/L      Assessment & Plan:   Problem List Items Addressed This Visit     Coronary artery disease involving native coronary artery of native heart with angina pectoris ([place]    Followed by Cardiology S/p CABG On med management      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 ([place]    Stable chronic problem, without new changes or concerns On DAPT with Plavix + ASA Off Eliquis      History of cerebrovascular accident (CVA) with residual deficit   Hypothyroidism    Controlled Continue on Levothyroxine 1274m daily      Mixed hyperlipidemia    Controlled cholesterol on statin and lifestyle Last lipid panel 12/2021 CAD, history CVA  Plan: 1. Continue current meds - Atorvastatin 8011m. DAPT 2ndary ASCVD risk reduction 3. Encourage improved lifestyle - low carb/cholesterol, reduce portion size, continue improving regular exercise      Obesity (BMI 30.0-34.9)   OSA on CPAP    Well controlled, chronic OSA on CPAP - Good adherence to CPAP nightly - Continue current CPAP therapy, patient seems to be benefiting from therapy      Pre-diabetes    Controlled A1c at 6.2  Stable from  previous      Systolic CHF ([place])    Followed by Cardiology Today mostly euvolemic, some edema Continues on diuretic regimen per cardiology lasix 80 BID - advised that caution with missed doses if affecting dyspnea and wt gain should f/u with Cards      Other Visit Diagnoses     Annual physical exam    -  Primary   Essential hypertension       Acute non-recurrent frontal sinusitis       Relevant Medications   azithromycin (ZITHROMAX Z-PAK) 250 MG tablet       Updated Health Maintenance information Reviewed recent lab results with patient Encouraged improvement to lifestyle with diet and exercise Goal of weight loss Weight down 10 lbs   Umbilical hernia Stable today Discussed prognosis and management Agree to observe for now, f/u if worsening criteria given  Meds ordered this encounter  Medications   azithromycin (ZITHROMAX Z-PAK) 250 MG tablet    Sig: Take 2 tabs (5106m total) on Day 1. Take 1 tab (253m daily for next 4 days.    Dispense:  6 tablet    Refill:  0      Follow up plan: Return in about 6 months (around 06/30/2022) for 6 month follow-up PreDM A1c, HTN heart updates.  AlNobie PutnamDOWestoveredical Group 12/29/2021, 2:31 PM

## 2021-12-29 NOTE — Assessment & Plan Note (Signed)
Well controlled, chronic OSA on CPAP - Good adherence to CPAP nightly - Continue current CPAP therapy, patient seems to be benefiting from therapy  

## 2021-12-29 NOTE — Assessment & Plan Note (Signed)
Followed by Cardiology Today mostly euvolemic, some edema Continues on diuretic regimen per cardiology lasix 80 BID - advised that caution with missed doses if affecting dyspnea and wt gain should f/u with Cards

## 2021-12-29 NOTE — Assessment & Plan Note (Signed)
Followed by Cardiology S/p CABG On med management

## 2021-12-29 NOTE — Assessment & Plan Note (Signed)
Stable chronic problem, without new changes or concerns On DAPT with Plavix + ASA Off Eliquis

## 2021-12-29 NOTE — Assessment & Plan Note (Addendum)
Controlled A1c at 6.2 Stable from previous

## 2021-12-30 ENCOUNTER — Encounter: Payer: Medicare PPO | Admitting: Family Medicine

## 2022-01-01 ENCOUNTER — Telehealth: Payer: Self-pay

## 2022-01-01 ENCOUNTER — Telehealth: Payer: Medicare PPO

## 2022-01-01 NOTE — Telephone Encounter (Signed)
   CCM RN Visit Note   01-01-2022 Name: Belen Zwahlen MRN: [id number]      DOB: 03-10-1940  Subjective: Jill Ruppe is a 81 y.o. year old male who is a primary care patient of Dr. Rosiland Oz patient was referred to the Chronic Care Management team for assistance with care management needs subsequent to provider initiation of CCM services and plan of care.      An unsuccessful telephone outreach was attempted today to contact the patient about Chronic Care Management needs.    Plan:A HIPAA compliant phone message was left for the patient providing contact information and requesting a return call.  Alto Denver RN, MSN, CCM RN Care Manager  Chronic Care Management Direct Number: [phone]

## 2022-02-09 DIAGNOSIS — G4733 Obstructive sleep apnea (adult) (pediatric): Secondary | ICD-10-CM | POA: Diagnosis not present

## 2022-02-12 ENCOUNTER — Telehealth: Payer: Self-pay

## 2022-02-12 ENCOUNTER — Telehealth: Payer: Medicare PPO

## 2022-02-12 NOTE — Telephone Encounter (Signed)
   CCM RN Visit Note   02-12-2022 Name: Luke Mckee MRN: [id number]      DOB: [birth year]  Subjective: Luke Mckee is a 82 y.o. year old male who is a primary care patient of Dr. Parks Ranger. The patient was referred to the Chronic Care Management team for assistance with care management needs subsequent to provider initiation of CCM services and plan of care.      An unsuccessful telephone outreach was attempted today to contact the patient about Chronic Care Management needs.    Plan:A HIPAA compliant phone message was left for the patient providing contact information and requesting a return call.  Noreene Larsson RN, MSN, CCM RN Care Manager  Chronic Care Management Direct Number: [phone]

## 2022-02-16 ENCOUNTER — Telehealth: Payer: Medicare PPO

## 2022-02-18 DIAGNOSIS — Z961 Presence of intraocular lens: Secondary | ICD-10-CM | POA: Diagnosis not present

## 2022-02-18 DIAGNOSIS — H35071 Retinal telangiectasis, right eye: Secondary | ICD-10-CM | POA: Diagnosis not present

## 2022-02-18 DIAGNOSIS — H43813 Vitreous degeneration, bilateral: Secondary | ICD-10-CM | POA: Diagnosis not present

## 2022-03-05 ENCOUNTER — Telehealth: Payer: Medicare PPO

## 2022-03-05 ENCOUNTER — Ambulatory Visit (INDEPENDENT_AMBULATORY_CARE_PROVIDER_SITE_OTHER): Payer: Medicare PPO

## 2022-03-05 DIAGNOSIS — I10 Essential (primary) hypertension: Secondary | ICD-10-CM

## 2022-03-05 DIAGNOSIS — I5022 Chronic systolic (congestive) heart failure: Secondary | ICD-10-CM

## 2022-03-05 NOTE — Chronic Care Management (AMB) (Signed)
Chronic Care Management   CCM RN Visit Note  03/05/2022 Name: Luke Mckee MRN: [id number] DOB: 12-16-1940  Subjective: Luke Mckee is a 82 y.o. year old male who is a primary care patient of Olin Hauser, DO. The patient was referred to the Chronic Care Management team for assistance with care management needs subsequent to provider initiation of CCM services and plan of care.    Today's Visit:  Engaged with patient by telephone for follow up visit.        Goals Addressed             This Visit's Progress    CCM Expected Outcome:  Monitor, Self-Manage and Reduce Symptoms of Heart Failure       Current Barriers:  Knowledge Deficits related to effective management of HF Chronic Disease Management support and education needs related to effective management of HF Wt Readings from Last 3 Encounters:  03/05/22 200 lb (90.7 kg)  12/29/21 229 lb (103.9 kg)  11/25/21 239 lb (108.4 kg)     Planned Interventions: Basic overview and discussion of pathophysiology of Heart Failure reviewed. The patient has been steadily losing weight by the use of a product called "Lean Belly". The patient denies any issues with swelling or edema. The patient states he is doing well and denies any new concerns with heart failure or heart health.  Provided education on low sodium diet. The patient is compliant with the heart healthy diet.  Reviewed Heart Failure Action Plan in depth. Knows how to effectively manage his heart failure. Assessed need for readable accurate scales in home. Has a scale. Provided education about placing scale on hard, flat surface Advised patient to weigh each morning after emptying bladder Discussed importance of daily weight and advised patient to weigh and record daily Reviewed role of diuretics in prevention of fluid overload and management of heart failure Discussed the importance of keeping all appointments with provider. Saw the pcp on 12-29-2021, sees the  cardiologist in August on the 19th.  Provided patient with education about the role of exercise in the management of heart failure Advised patient to discuss changes in fluid balance or new concerns or questions related to heart failure with provider. The patient states he is stable.  Symptom Management: Take medications as prescribed   Attend all scheduled provider appointments Call pharmacy for medication refills 3-7 days in advance of running out of medications Perform all self care activities independently  Perform IADL's (shopping, preparing meals, housekeeping, managing finances) independently Call provider office for new concerns or questions  call the Suicide and Crisis Lifeline: 988 call the [place] National Suicide Prevention Lifeline: 567-461-9249 or TTY: (217) 267-0209 TTY 519-420-7154) to talk to a trained counselor call 1-800-273-TALK (toll free, 24 hour hotline) if experiencing a Mental Health or [place]   Follow Up Plan: Telephone follow up appointment with care management team member scheduled for: 04-23-2022 at 230 pm       CCM Expected Outcome:  Monitor, Self-Manage, and Reduce Symptoms of Hypertension       Current Barriers:  Knowledge Deficits related to effective management of HTN Chronic Disease Management support and education needs related to HTN and heart health BP Readings from Last 3 Encounters:  12/29/21 128/66  09/25/21 120/80  08/14/21 110/70     Planned Interventions: Evaluation of current treatment plan related to hypertension self management and patient's adherence to plan as established by provider. The patient is stable and denies any issues with HTN or heart  health. The patient is losing weight. The patient is stable at this time.;   Provided education to patient re: stroke prevention, s/s of heart attack and stroke; Reviewed prescribed diet Heart Healthy. The patient is compliant with heart healthy diet. The patient is losing weight.  He and his wife are taking a supplement called Lean Belly. He is down to 200.6. Feels great and is also going to the [facility] and doing the silver sneakers program. Praised the patient for positive changes.  Reviewed medications with patient and discussed importance of compliance. The patient is compliant with medications. The patient is having issues with getting his 90 day supply of medications. Reviewed medications and has refills ordered. Will collaborate with the pharm D and pcp. Secured an appointment with the pharm D for outreach for 03-15-2022. Education provided. Per the patients wife it said needed provider approval and partial refills. One script had 37 pills and the other 70. Education provided. ;  Discussed plans with patient for ongoing care management follow up and provided patient with direct contact information for care management team; Advised patient, providing education and rationale, to monitor blood pressure daily and record, calling PCP for findings outside established parameters. Blood pressures are stable. Denies any acute findings related to blood pressures or heart health;  Reviewed scheduled/upcoming provider appointments including: 12-29-2021 saw pcp. Sees the cardiologist in August. Knows to call for changes.  Advised patient to discuss changes in HTN or heart health with provider; Provided education on prescribed diet heart healthy. Review and education provided;  Discussed complications of poorly controlled blood pressure such as heart disease, stroke, circulatory complications, vision complications, kidney impairment, sexual dysfunction;  Screening for signs and symptoms of depression related to chronic disease state;  Assessed social determinant of health barriers;  The patient asked about referral to the podiatrist due to some issues he is having with his foot that he had surgery on. Education provided about local podiatrist and number provided. Information provided and  questions answered. The patient also can go to the [place]. Also reviewed information about the [place] concerns. They have been in contact with representatives but despite all the health problems the patient and his wife have they said they do not qualify for assistance.   Symptom Management: Take medications as prescribed   Attend all scheduled provider appointments Call pharmacy for medication refills 3-7 days in advance of running out of medications Call provider office for new concerns or questions  call the Suicide and Crisis Lifeline: 988 call the [place] National Suicide Prevention Lifeline: (956)686-8873 or TTY: 607-681-9547 TTY 539-318-1616) to talk to a trained counselor call 1-800-273-TALK (toll free, 24 hour hotline) if experiencing a Mental Health or [place]  check blood pressure weekly learn about high blood pressure call doctor for signs and symptoms of high blood pressure develop an action plan for high blood pressure keep all doctor appointments take medications for blood pressure exactly as prescribed report new symptoms to your doctor  Follow Up Plan: Telephone follow up appointment with care management team member scheduled for: 04-23-2022 at 230 pm       COMPLETED: CCM: Assistance with obtaining CPAP       Care Coordination Interventions: Follow up outreach with [employer]. Representative will review faxed documents from 10/13-10/16 to confirm documentation for Mr. Taing was received. Agreed to call back and contact patient if the agency did not receive the required documents. Of note, representative indicated that if the fax isn't  working correctly he would discuss with documentation team to determine if documents can be scanned via email. [facility] email provided. Will update PCP if additional documentation is required. Call made back to [employer] at [phone] ext [phone], spoke to [facility]. Follow up with [facility] related  to the call from the patients wife stating that [employer] still did not have the information they needed to process the order for the patients CPAP.  [place] Mam states that she needs a note for use and benefits for the cpap that the notes from 08-29-2021 did not contain that information.  They also need an order for the cpap with pressure settings. Secure message sent to the pcp with information needed to be sent to [employer] so the order can be processed and the patient can get his replacement CPAP.  After collaboration with pcp the patient will see the pcp on 11-25-2021 at 4 pm via video to complete the needed orders.  11-26-2021: Received a call from Gae Bon at [facility] requesting documents needed to process the order for the patients CPAP. Gae Bon also ask about compliance on behalf of the patient. Review of PCP notes and read the notes. Explained that documents should have been faxed late yesterday or this am with updated office visit note and the order for a new CPAP. The patient does not need a new sleep study. The purpose of the visit was to get use and benefits and order for new CPAP as he is currently using a loaner as his CPAP stopped functioning properly. Gae Bon has not received the information yet but will look for it. Advised that the information was faxed to the attention of [facility]. Gae Bon will follow up and call the [facility] for any other needs. Collaboration with the pcp related to speaking with Gae Bon about paperwork needed for completion of the CPAP order. Will continue to monitor for new needs or changes.  11-30-2021: [employer] called and stated that they did not receive the paperwork for the CPAP machine. Collaboration with the pcp and the paperwork was faxed last week. Incoming call back from [place] know that the paperwork has been received and they are moving forward with the processing the order for a cpap machine. Let the pcp know that they do have the paperwork to process the order. Will  continue to monitor.         Plan:Telephone follow up appointment with care management team member scheduled for:  04-23-2022 at 25 pm  [place], MSN, [place] Management Direct Number: [phone]

## 2022-03-05 NOTE — Patient Instructions (Signed)
Please call the care guide team at [phone] if you need to cancel or reschedule your appointment.   If you are experiencing a Mental Health or [place] or need someone to talk to, please call the Suicide and Crisis Lifeline: 988 call the [place] National Suicide Prevention Lifeline: 8596333839 or TTY: 367-854-0963 TTY 930-844-4030) to talk to a trained counselor call 1-800-273-TALK (toll free, 24 hour hotline)   Following is a copy of the CCM Program Consent:  CCM service includes personalized support from designated clinical staff supervised by the physician, including individualized plan of care and coordination with other care providers 24/7 contact phone numbers for assistance for urgent and routine care needs. Service will only be billed when office clinical staff spend 20 minutes or more in a month to coordinate care. Only one practitioner may furnish and bill the service in a calendar month. The patient may stop CCM services at amy time (effective at the end of the month) by phone call to the office staff. The patient will be responsible for cost sharing (co-pay) or up to 20% of the service fee (after annual deductible is met)  Following is a copy of your full provider care plan:   Goals Addressed             This Visit's Progress    CCM Expected Outcome:  Monitor, Self-Manage and Reduce Symptoms of Heart Failure       Current Barriers:  Knowledge Deficits related to effective management of HF Chronic Disease Management support and education needs related to effective management of HF Wt Readings from Last 3 Encounters:  03/05/22 200 lb (90.7 kg)  12/29/21 229 lb (103.9 kg)  11/25/21 239 lb (108.4 kg)     Planned Interventions: Basic overview and discussion of pathophysiology of Heart Failure reviewed. The patient has been steadily losing weight by the use of a product called "Lean Belly". The patient denies any issues with swelling or edema. The patient  states he is doing well and denies any new concerns with heart failure or heart health.  Provided education on low sodium diet. The patient is compliant with the heart healthy diet.  Reviewed Heart Failure Action Plan in depth. Knows how to effectively manage his heart failure. Assessed need for readable accurate scales in home. Has a scale. Provided education about placing scale on hard, flat surface Advised patient to weigh each morning after emptying bladder Discussed importance of daily weight and advised patient to weigh and record daily Reviewed role of diuretics in prevention of fluid overload and management of heart failure Discussed the importance of keeping all appointments with provider. Saw the pcp on 12-29-2021, sees the cardiologist in August on the 19th.  Provided patient with education about the role of exercise in the management of heart failure Advised patient to discuss changes in fluid balance or new concerns or questions related to heart failure with provider. The patient states he is stable.  Symptom Management: Take medications as prescribed   Attend all scheduled provider appointments Call pharmacy for medication refills 3-7 days in advance of running out of medications Perform all self care activities independently  Perform IADL's (shopping, preparing meals, housekeeping, managing finances) independently Call provider office for new concerns or questions  call the Suicide and Crisis Lifeline: 988 call the [place] National Suicide Prevention Lifeline: 540-850-9913 or TTY: 406-514-3321 TTY 986-321-6324) to talk to a trained counselor call 1-800-273-TALK (toll free, 24 hour hotline) if experiencing a Mental Health or [place]  Crisis   Follow Up Plan: Telephone follow up appointment with care management team member scheduled for: 04-23-2022 at 230 pm       CCM Expected Outcome:  Monitor, Self-Manage, and Reduce Symptoms of Hypertension       Current Barriers:   Knowledge Deficits related to effective management of HTN Chronic Disease Management support and education needs related to HTN and heart health BP Readings from Last 3 Encounters:  12/29/21 128/66  09/25/21 120/80  08/14/21 110/70     Planned Interventions: Evaluation of current treatment plan related to hypertension self management and patient's adherence to plan as established by provider. The patient is stable and denies any issues with HTN or heart health. The patient is losing weight. The patient is stable at this time.;   Provided education to patient re: stroke prevention, s/s of heart attack and stroke; Reviewed prescribed diet Heart Healthy. The patient is compliant with heart healthy diet. The patient is losing weight. He and his wife are taking a supplement called Lean Belly. He is down to 200.6. Feels great and is also going to the [facility] and doing the silver sneakers program. Praised the patient for positive changes.  Reviewed medications with patient and discussed importance of compliance. The patient is compliant with medications. The patient is having issues with getting his 90 day supply of medications. Reviewed medications and has refills ordered. Will collaborate with the pharm D and pcp. Secured an appointment with the pharm D for outreach for 03-15-2022. Education provided. Per the patients wife it said needed provider approval and partial refills. One script had 37 pills and the other 70. Education provided. ;  Discussed plans with patient for ongoing care management follow up and provided patient with direct contact information for care management team; Advised patient, providing education and rationale, to monitor blood pressure daily and record, calling PCP for findings outside established parameters. Blood pressures are stable. Denies any acute findings related to blood pressures or heart health;  Reviewed scheduled/upcoming provider appointments including: 12-29-2021 saw pcp.  Sees the cardiologist in August. Knows to call for changes.  Advised patient to discuss changes in HTN or heart health with provider; Provided education on prescribed diet heart healthy. Review and education provided;  Discussed complications of poorly controlled blood pressure such as heart disease, stroke, circulatory complications, vision complications, kidney impairment, sexual dysfunction;  Screening for signs and symptoms of depression related to chronic disease state;  Assessed social determinant of health barriers;  The patient asked about referral to the podiatrist due to some issues he is having with his foot that he had surgery on. Education provided about local podiatrist and number provided. Information provided and questions answered. The patient also can go to the [place]. Also reviewed information about the [place] concerns. They have been in contact with representatives but despite all the health problems the patient and his wife have they said they do not qualify for assistance.   Symptom Management: Take medications as prescribed   Attend all scheduled provider appointments Call pharmacy for medication refills 3-7 days in advance of running out of medications Call provider office for new concerns or questions  call the Suicide and Crisis Lifeline: 988 call the [place] National Suicide Prevention Lifeline: 314-356-2449 or TTY: 878-479-1034 TTY (561)071-6800) to talk to a trained counselor call 1-800-273-TALK (toll free, 24 hour hotline) if experiencing a Mental Health or [place]  check blood pressure weekly learn about high blood pressure call doctor for  signs and symptoms of high blood pressure develop an action plan for high blood pressure keep all doctor appointments take medications for blood pressure exactly as prescribed report new symptoms to your doctor  Follow Up Plan: Telephone follow up appointment with care management team member scheduled  for: 04-23-2022 at 230 pm       COMPLETED: CCM: Assistance with obtaining CPAP       Care Coordination Interventions: Follow up outreach with [employer]. Representative will review faxed documents from 10/13-10/16 to confirm documentation for Mr. Diedrich was received. Agreed to call back and contact patient if the agency did not receive the required documents. Of note, representative indicated that if the fax isn't working correctly he would discuss with documentation team to determine if documents can be scanned via email. [place] email provided. Will update PCP if additional documentation is required. Call made back to [employer] [facility] at [phone] ext [phone], spoke to [facility]. Follow up with [facility] related to the call from the patients wife stating that [employer] still did not have the information they needed to process the order for the patients CPAP.  [place] Mam states that she needs a note for use and benefits for the cpap that the notes from 08-29-2021 did not contain that information.  They also need an order for the cpap with pressure settings. Secure message sent to the pcp with information needed to be sent to [employer] so the order can be processed and the patient can get his replacement CPAP.  After collaboration with pcp the patient will see the pcp on 11-25-2021 at 4 pm via video to complete the needed orders.  11-26-2021: Received a call from Gae Bon at [facility] requesting documents needed to process the order for the patients CPAP. Gae Bon also ask about compliance on behalf of the patient. Review of PCP notes and read the notes. Explained that documents should have been faxed late yesterday or this am with updated office visit note and the order for a new CPAP. The patient does not need a new sleep study. The purpose of the visit was to get use and benefits and order for new CPAP as he is currently using a loaner as his CPAP stopped functioning properly. Gae Bon has not received the  information yet but will look for it. Advised that the information was faxed to the attention of [facility]. Gae Bon will follow up and call the [facility] for any other needs. Collaboration with the pcp related to speaking with Gae Bon about paperwork needed for completion of the CPAP order. Will continue to monitor for new needs or changes.  11-30-2021: [employer] called and stated that they did not receive the paperwork for the CPAP machine. Collaboration with the pcp and the paperwork was faxed last week. Incoming call back from [place] know that the paperwork has been received and they are moving forward with the processing the order for a cpap machine. Let the pcp know that they do have the paperwork to process the order. Will continue to monitor.         Patient verbalizes understanding of instructions and care plan provided today and agrees to view in [place]. Active MyChart status and patient understanding of how to access instructions and care plan via MyChart confirmed with patient.     Telephone follow up appointment with care management team member scheduled for: 04-23-2022 at 230 pm

## 2022-03-05 NOTE — Plan of Care (Signed)
Chronic Care Management Provider Comprehensive Care Plan    03/05/2022 Name: Luke Mckee MRN: [id number] DOB: Feb 11, 1940  Referral to Chronic Care Management (CCM) services was placed by Provider:  Dr. Parks Ranger  on Date: 10-01-2021.  Chronic Condition 1: Heart Failure Provider Assessment and Plan Followed by Cardiology Today mostly euvolemic, some residual edema Continues on diuretic regimen per cardiology lasix 80 BID - advised that caution with missed doses if affecting dyspnea and wt gain should f/u with Cards   Expected Outcome/Goals Addressed This Visit (Provider CCM goals/Provider Assessment and plan   CCM (HEART FAILURE)  EXPECTED OUTCOME:  MONITOR, SELF-MANAGE AND REDUCE SYMPTOMS OF HEART FAILURE   Symptom Management Condition 1: Take all medications as prescribed Attend all scheduled provider appointments Call provider office for new concerns or questions  call the Suicide and Crisis Lifeline: 988 call the [place] National Suicide Prevention Lifeline: 928 442 4708 or TTY: 713-531-3943 [place] 562-831-2934) to talk to a trained counselor call 1-800-273-TALK (toll free, 24 hour hotline) if experiencing a Mental Health or [place]  call office if I gain more than 2 pounds in one day or 5 pounds in one week use salt in moderation develop a rescue plan follow rescue plan if symptoms flare-up track symptoms and what helps feel better or worse dress right for the weather, hot or cold  Chronic Condition 2: HTN Provider Assessment and Plan   Followed by Cardiology S/p CABG On med management     Expected Outcome/Goals Addressed This Visit (Provider CCM goals/Provider Assessment and plan   CCM (HYPERTENSION) EXPECTED OUTCOME: MONITOR,SELF- MANAGE AND REDUCE SYMPTOMS OF HYPERTENSION   Symptom Management Condition 2: Take all medications as prescribed Attend all scheduled provider appointments Call provider office for new concerns or questions  call the  Suicide and Crisis Lifeline: 988 call the [place] National Suicide Prevention Lifeline: (438)179-7262 or TTY: 563-463-1852 [place] 269-331-0350) to talk to a trained counselor call 1-800-273-TALK (toll free, 24 hour hotline) if experiencing a Mental Health or [place]  check blood pressure weekly learn about high blood pressure keep a blood pressure log take blood pressure log to all doctor appointments call doctor for signs and symptoms of high blood pressure keep all doctor appointments take medications for blood pressure exactly as prescribed report new symptoms to your doctor  Problem List Patient Active Problem List   Diagnosis Date Noted   Impaired fasting glucose 09/24/2021   Depression 09/24/2021   Sensorineural hearing loss, bilateral 09/24/2021   Hypothyroid 09/24/2021   Hyperlipidemia 09/24/2021   Sleep apnea 09/24/2021   Hallux limitus, acquired, right 11/12/2019   Pre-diabetes 10/17/2019   Recurrent major depressive disorder, in partial remission ([place]) 04/16/2019   Obesity (BMI 30.0-34.9) 04/16/2019   Vasomotor rhinitis 10/25/2018   Hearing loss 08/28/2018   Osteoarthritis 08/28/2018   Tinnitus 08/28/2018   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 ([place]) 07/31/2018   Slurred speech 07/23/2018   Right arm weakness 07/23/2018   Hypokalemia 07/23/2018   Anxiety 07/20/2018   Atherosclerosis of native coronary artery of native heart with stable angina pectoris ([place]) 06/16/2018   Hx of CABG 06/16/2018   Mixed hyperlipidemia 06/16/2018   Benign essential HTN 06/16/2018   History of cerebrovascular accident (CVA) with residual deficit [id number]   Systolic CHF ([place]) [id number]   Hypertension 02/15/2018   S/P total knee arthroplasty, right 02/15/2018   Primary osteoarthritis of both knees 11/25/2017   CAD (coronary artery disease) 11/12/2016   Abnormal nuclear stress test 06/30/2016  Hallux limitus of right foot 10/22/2015    Anesthesia complication [id number]   GERD (gastroesophageal reflux disease) 10/10/2015   Hypothyroidism 10/10/2015   Coronary artery disease involving native coronary artery of native heart with angina pectoris ([place]) 10/10/2015   OSA on CPAP 01/10/2007   Diagnosis unknown 01/12/2003    Medication Management  Current Outpatient Medications:    acetaminophen (TYLENOL) 650 MG CR tablet, Take 1,300 mg by mouth at bedtime as needed., Disp: , Rfl:    albuterol (VENTOLIN HFA) 108 (90 Base) MCG/ACT inhaler, Inhale 1-2 puffs into the lungs every 4 (four) hours as needed for wheezing or shortness of breath (cough)., Disp: 1 each, Rfl: 3   aspirin EC 81 MG tablet, Take 81 mg by mouth daily., Disp: , Rfl:    atenolol (TENORMIN) 50 MG tablet, TAKE 1 TABLET(50 MG) BY MOUTH AT BEDTIME (Patient taking differently: Take 50 mg by mouth daily.), Disp: 90 tablet, Rfl: 3   atorvastatin (LIPITOR) 80 MG tablet, TAKE 1 TABLET(80 MG) BY MOUTH DAILY, Disp: 90 tablet, Rfl: 3   azithromycin (ZITHROMAX Z-PAK) 250 MG tablet, Take 2 tabs ('500mg'$  total) on Day 1. Take 1 tab ('250mg'$ ) daily for next 4 days., Disp: 6 tablet, Rfl: 0   Calcium Carb-Cholecalciferol (CALCIUM 600 + D PO), Take 1 tablet by mouth daily., Disp: , Rfl:    Cholecalciferol (VITAMIN D) 50 MCG (2000 UT) tablet, Take 4,000 Units by mouth daily., Disp: , Rfl:    clopidogrel (PLAVIX) 75 MG tablet, Take 1 tablet (75 mg total) by mouth daily., Disp: 90 tablet, Rfl: 3   clotrimazole-betamethasone (LOTRISONE) cream, Apply twice a day for 1-2 weeks, may repeat if need in future, Disp: 30 g, Rfl: 1   co-enzyme Q-10 30 MG capsule, Take 100 mg by mouth daily., Disp: , Rfl:    diclofenac sodium (VOLTAREN) 1 % GEL, Apply 2 g topically 4 (four) times daily as needed for pain. Foot pain, Disp: , Rfl:    furosemide (LASIX) 80 MG tablet, Take 1 tablet (80 mg total) by mouth 2 (two) times daily., Disp: 180 tablet, Rfl: 3   ipratropium (ATROVENT) 0.06 % nasal spray, Place 2  sprays into both nostrils 4 (four) times daily as needed for rhinitis., Disp: 15 mL, Rfl: 2   levothyroxine (SYNTHROID) 125 MCG tablet, TAKE 1 TABLET(125 MCG) BY MOUTH DAILY BEFORE BREAKFAST, Disp: 90 tablet, Rfl: 3   loratadine (CLARITIN) 10 MG tablet, Take 10 mg by mouth daily., Disp: , Rfl:    LORazepam (ATIVAN) 0.5 MG tablet, Take 1 tablet (0.5 mg total) by mouth daily as needed for anxiety or sleep., Disp: 30 tablet, Rfl: 1   montelukast (SINGULAIR) 10 MG tablet, Take 1 tablet (10 mg total) by mouth at bedtime., Disp: 90 tablet, Rfl: 3   Multiple Vitamin (MULTIVITAMIN) tablet, Take 1 tablet by mouth daily., Disp: , Rfl:    nitroGLYCERIN (NITROSTAT) 0.4 MG SL tablet, Place 1 tablet (0.4 mg total) under the tongue every 5 (five) minutes as needed for chest pain., Disp: 20 tablet, Rfl: 2   PATADAY 0.1 % ophthalmic solution, Place into both eyes as needed. , Disp: , Rfl:    sennosides-docusate sodium (SENOKOT-S) 8.6-50 MG tablet, Take 1-2 tablets by mouth daily as needed for constipation., Disp: , Rfl:    sertraline (ZOLOFT) 100 MG tablet, Take 2 tablets (200 mg total) by mouth daily., Disp: 180 tablet, Rfl: 3   spironolactone (ALDACTONE) 25 MG tablet, Take 0.5 tablets (12.5 mg total) by mouth daily.,  Disp: 45 tablet, Rfl: 3   traZODone (DESYREL) 50 MG tablet, TAKE 1 TABLET(50 MG) BY MOUTH AT BEDTIME, Disp: 90 tablet, Rfl: 3  Cognitive Assessment Identity Confirmed: : Name; DOB Cognitive Status: Normal Other:  : spoke to [place] concerning the cpap order   Functional Assessment Hearing Difficulty or Deaf: no Wear Glasses or Blind: no Concentrating, Remembering or Making Decisions Difficulty (CP): no Difficulty Communicating: no Difficulty Eating/Swallowing: no Walking or Climbing Stairs Difficulty: yes Walking or Climbing Stairs: ambulation difficulty, requires equipment Mobility Management: encouraged the patient to use his cane, does not like to use cane, history of  falls Dressing/Bathing Difficulty: no Doing Errands Independently Difficulty (such as shopping) (CP): yes Errands Management: His wife assist with errands Change in Functional Status Since Onset of Current Illness/Injury: no   Caregiver Assessment  Primary Source of Support/Comfort: spouse Name of Support/Comfort Primary Source: Gerlene Fee in Home: spouse Name(s) of People in Home: [place] if Needed: spouse Family Caregiver Names: Arbie Cookey- his wife Primary Roles/Responsibilities: retired   Planned Interventions  Evaluation of current treatment plan related to hypertension self management and patient's adherence to plan as established by provider;   Provided education to patient re: stroke prevention, s/s of heart attack and stroke; Reviewed prescribed diet Heart Healthy Reviewed medications with patient and discussed importance of compliance;  Discussed plans with patient for ongoing care management follow up and provided patient with direct contact information for care management team; Advised patient, providing education and rationale, to monitor blood pressure daily and record, calling PCP for findings outside established parameters;  Reviewed scheduled/upcoming provider appointments including: 12-29-2021 with pcp, reminder given today  Advised patient to discuss changes in HTN or heart health with provider; Provided education on prescribed diet heart healthy;  Discussed complications of poorly controlled blood pressure such as heart disease, stroke, circulatory complications, vision complications, kidney impairment, sexual dysfunction;  Screening for signs and symptoms of depression related to chronic disease state;  Assessed social determinant of health barriers;  Basic overview and discussion of pathophysiology of Heart Failure reviewed Provided education on low sodium diet Reviewed Heart Failure Action Plan in depth and provided written copy Assessed need for  readable accurate scales in home Provided education about placing scale on hard, flat surface Advised patient to weigh each morning after emptying bladder Discussed importance of daily weight and advised patient to weigh and record daily Reviewed role of diuretics in prevention of fluid overload and management of heart failure Discussed the importance of keeping all appointments with provider Provided patient with education about the role of exercise in the management of heart failure Advised patient to discuss changes in fluid balance or new concerns or questions related to heart failure with provider   Interaction and coordination with outside resources, practitioners, and providers See CCM Referral  Care Plan: Available in MyChart

## 2022-03-08 ENCOUNTER — Ambulatory Visit: Payer: Medicare PPO | Admitting: Pharmacist

## 2022-03-08 DIAGNOSIS — I10 Essential (primary) hypertension: Secondary | ICD-10-CM

## 2022-03-08 NOTE — Chronic Care Management (AMB) (Signed)
Chronic Care Management CCM Pharmacy Note  03/08/2022 Name:  Luke Mckee MRN:  [id number] DOB:  [birth year]   Subjective: Luke Mckee is an 82 y.o. year old male who is a primary patient of Olin Hauser, DO.  The CCM team was consulted for assistance with disease management and care coordination needs.    Engaged with patient by telephone for follow up visit for pharmacy case management and/or care coordination services.   Objective:  Medications Reviewed Today     Reviewed by Rennis Petty, RPH-CPP (Pharmacist) on 03/08/22 at 1235  Med List Status: <None>   Medication Order Taking? Sig Documenting Provider Last Dose Status Informant  acetaminophen (TYLENOL) 650 MG CR tablet [id number] Yes Take 1,300 mg by mouth at bedtime as needed. [provider] Taking Active Spouse/Significant Other  albuterol (VENTOLIN HFA) 108 (90 Base) MCG/ACT inhaler [id number] Yes Inhale 1-2 puffs into the lungs every 4 (four) hours as needed for wheezing or shortness of breath (cough). Olin Hauser, DO Taking Active   aspirin EC 81 MG tablet [id number] Yes Take 81 mg by mouth daily. [provider] Taking Active   atenolol (TENORMIN) 50 MG tablet [id number] Yes TAKE 1 TABLET(50 MG) BY MOUTH AT BEDTIME  Patient taking differently: Take 50 mg by mouth daily.   Olin Hauser, DO Taking Active            Med Note Drema [place], [place]   Fri Sep 25, 2021  2:36 PM) Takes in the a.m.  atorvastatin (LIPITOR) 80 MG tablet [id number] Yes TAKE 1 TABLET(80 MG) BY MOUTH DAILY Olin Hauser, DO Taking Active   Calcium Carb-Cholecalciferol (CALCIUM 600 + D PO) [id number] Yes Take 1 tablet by mouth daily. [provider] Taking Active   Cholecalciferol (VITAMIN D) 50 MCG (2000 UT) tablet [id number] Yes Take 2,000 Units by mouth daily. [provider] Taking Active Spouse/Significant Other  clopidogrel (PLAVIX) 75 MG tablet [id number] Yes Take 1  tablet (75 mg total) by mouth daily. Olin Hauser, DO Taking Active   clotrimazole-betamethasone (LOTRISONE) cream [id number] Yes Apply twice a day for 1-2 weeks, may repeat if need in future Olin Hauser, DO Taking Active   co-enzyme Q-10 30 MG capsule [id number] Yes Take 100 mg by mouth daily. [provider] Taking Active   diclofenac sodium (VOLTAREN) 1 % GEL [id number] Yes Apply 2 g topically 4 (four) times daily as needed for pain. Foot pain [provider] Taking Active Spouse/Significant Other  furosemide (LASIX) 80 MG tablet [id number] Yes Take 1 tablet (80 mg total) by mouth 2 (two) times daily. Olin Hauser, DO Taking Active   ipratropium (ATROVENT) 0.06 % nasal spray [id number] Yes Place 2 sprays into both nostrils 4 (four) times daily as needed for rhinitis. Olin Hauser, DO Taking Active   levothyroxine (SYNTHROID) 125 MCG tablet [id number] Yes TAKE 1 TABLET(125 MCG) BY MOUTH DAILY BEFORE BREAKFAST Karamalegos, Devonne Doughty, DO Taking Active   loratadine (CLARITIN) 10 MG tablet [id number] Yes Take 10 mg by mouth daily. [provider] Taking Active   LORazepam (ATIVAN) 0.5 MG tablet [id number]  Take 1 tablet (0.5 mg total) by mouth daily as needed for anxiety or sleep. Karamalegos, Devonne Doughty, DO  Active   montelukast (SINGULAIR) 10 MG tablet [id number] Yes Take 1 tablet (10 mg total) by mouth at bedtime. Olin Hauser, DO Taking Active   Multiple Vitamin (MULTIVITAMIN) tablet [id number] Yes Take 1 tablet by mouth daily. [provider] Taking Active Spouse/Significant Other  nitroGLYCERIN (NITROSTAT) 0.4 MG SL tablet [id number]  Place 1 tablet (0.4 mg total) under the tongue every 5 (five) minutes as needed for chest pain. Karamalegos, Devonne Doughty, DO  Active   PATADAY 0.1 % ophthalmic solution [id number] Yes Place into both eyes as needed.  [provider] Taking Active   sennosides-docusate  sodium (SENOKOT-S) 8.6-50 MG tablet [id number] Yes Take 1-2 tablets by mouth daily as needed for constipation. [provider] Taking Active   sertraline (ZOLOFT) 100 MG tablet [id number] Yes Take 2 tablets (200 mg total) by mouth daily. Olin Hauser, DO Taking Active   spironolactone (ALDACTONE) 25 MG tablet [id number] Yes Take 0.5 tablets (12.5 mg total) by mouth daily. Olin Hauser, DO Taking Active   traZODone (DESYREL) 50 MG tablet [id number] Yes TAKE 1 TABLET(50 MG) BY MOUTH AT BEDTIME Olin Hauser, DO Taking Active             Pertinent Labs:   Lab Results  Component Value Date   CHOL 127 12/22/2021   HDL 42 12/22/2021   LDLCALC 62 12/22/2021   TRIG 143 12/22/2021   CHOLHDL 3.0 12/22/2021   Lab Results  Component Value Date   CREATININE 0.95 12/22/2021   BUN 13 12/22/2021   NA 143 12/22/2021   K 3.8 12/22/2021   CL 103 12/22/2021   CO2 30 12/22/2021   BP Readings from Last 3 Encounters:  12/29/21 128/66  09/25/21 120/80  08/14/21 110/70   Pulse Readings from Last 3 Encounters:  12/29/21 73  08/14/21 63  07/03/21 70     SDOH:  (Social Determinants of Health) assessments and interventions performed:  SDOH Interventions    [place] Office Visit from 12/29/2021 in [place] Management from 11/03/2021 in [facility] Clinical Support from 09/25/2021 in [facility] Coordination from 09/07/2021 in [place] Coordination from 08/24/2021 in [facility] Management from 07/28/2020 in [facility]  SDOH Interventions        Food Insecurity Interventions -- -- Intervention Not Indicated Intervention Not Indicated Intervention Not Indicated --  Housing Interventions -- -- Intervention Not Indicated  Intervention Not Indicated Intervention Not Indicated --  Transportation Interventions -- Intervention Not Indicated Intervention Not Indicated Intervention Not Indicated Intervention Not Indicated --  Utilities Interventions -- Intervention Not Indicated Intervention Not Indicated -- -- --  Alcohol Usage Interventions -- -- Intervention Not Indicated (Score <7) -- -- --  Depression Interventions/Treatment  Medication, Currently on Treatment -- Medication -- -- --  Financial Strain Interventions -- -- Intervention Not Indicated -- -- --  Physical Activity Interventions -- -- Intervention Not Indicated -- -- Other (Comments)  [uses a cubii and gets a lot of steps in, praised for changes]  Stress Interventions -- -- Intervention Not Indicated -- -- --  Social Connections Interventions -- -- -- -- Intervention Not Indicated --       CCM Care Plan  Review of patient past medical history, allergies, medications, health status, including review of consultants reports, laboratory and other test data, was performed as part of comprehensive evaluation and provision of chronic care management services.   Care Plan : PharmD - Med Management  Updates made by Rennis Petty, RPH-CPP since 03/08/2022 12:00 AM     Problem: Disease Progression  Long-Range Goal: Disease Progression Prevented or Minimized   Start Date: 02/04/2020  Expected End Date: 05/04/2020  Recent Progress: On track  Priority: High  Note:   Current Barriers:  Chronic Disease Management support, education, and care coordination needs related to CAD, HTN, HLD, and hx CVA  Pharmacist Clinical Goal(s):  Over the next 90 days, patient will adhere to plan to optimize therapeutic regimen for hypothyroidism as evidenced by report of adherence to recommended medication management changes through collaboration with PharmD and provider.    Interventions: 1:1 collaboration with Olin Hauser, DO regarding development and  update of comprehensive plan of care as evidenced by provider attestation and co-signature Inter-disciplinary care team collaboration (see longitudinal plan of care) Receive message from [place] requesting outreach to patient as patient having difficulty with picking up his medications from pharmacy - only receiving partial refills Outreach to patient/spouse by phone today. [name] wife advises when she last picked up patient's prescriptions, his atorvastatin and levothyroxine were each only filled for 37 day supply and his furosemide for a 35 day supply Follow up with [facility] to confirm - recent prescriptions filled for smaller quantity to allow for syncing of patient's prescriptions to be filled together. Pharmacy confirms patient has active refills for all 3 medications Follow up with patient/spouse to provide this information Comprehensive medication review performed; medication list updated in electronic medical record  Hypertension Controlled; current treatment: Atenolol 50 mg daily in the morning Furosemide 80 mg twice daily Spironolactone 25 mg - 1/2 tablet (12.5 mg) once daily Denies checking home blood pressure in past 2 weeks, but recalls latest readings running ~120s/70s Reports patient taking positional changes slowly Encourage to monitor home BP as directed by Cardiologist, keep log of results and call providers for readings outside of established parameters or symptoms   Patient Goals/Self-Care Activities Over the next 90 days, patient will:  - take medications as prescribed with assistance of wife   Note: patient uses weekly pillbox as filled by wife - check blood pressure, document, and provide at future appointments        Plan: Telephone follow up appointment with care management team member scheduled for:  06/11/2022 at 11:30 am  Wallace Cullens, PharmD, [place], [place] [phone]

## 2022-03-08 NOTE — Patient Instructions (Signed)
Visit Information  Thank you for taking time to visit with me today. Please don't hesitate to contact me if I can be of assistance to you before our next scheduled telephone appointment.  Following are the goals we discussed today:   Goals Addressed             This Visit's Progress    Pharmacy Goals       It was great talking with you today!  Please check your blood pressure 1-2 times/week at home and keep a record to bring to medical appointments  Our goal bad cholesterol, or LDL, is less than 70 . This is why it is important to continue taking your atorvastatin  Feel free to call me with any questions or concerns. I look forward to our next call!  Wallace Cullens, PharmD, [place] [phone]           Our next appointment is by telephone on 06/11/2022 at 11:30 am  Please call the care guide team at [phone] if you need to cancel or reschedule your appointment.   Patient verbalizes understanding of instructions and care plan provided today and agrees to view in [place]. Active MyChart status and patient understanding of how to access instructions and care plan via MyChart confirmed with patient.

## 2022-03-10 DIAGNOSIS — G4733 Obstructive sleep apnea (adult) (pediatric): Secondary | ICD-10-CM | POA: Diagnosis not present

## 2022-03-11 DIAGNOSIS — I110 Hypertensive heart disease with heart failure: Secondary | ICD-10-CM

## 2022-03-11 DIAGNOSIS — I5020 Unspecified systolic (congestive) heart failure: Secondary | ICD-10-CM

## 2022-03-11 DIAGNOSIS — G4733 Obstructive sleep apnea (adult) (pediatric): Secondary | ICD-10-CM | POA: Diagnosis not present

## 2022-03-15 ENCOUNTER — Telehealth: Payer: Medicare PPO

## 2022-04-10 DIAGNOSIS — G4733 Obstructive sleep apnea (adult) (pediatric): Secondary | ICD-10-CM | POA: Diagnosis not present

## 2022-04-15 ENCOUNTER — Other Ambulatory Visit: Payer: Self-pay | Admitting: Family Medicine

## 2022-04-15 DIAGNOSIS — I25810 Atherosclerosis of coronary artery bypass graft(s) without angina pectoris: Secondary | ICD-10-CM

## 2022-04-15 DIAGNOSIS — I10 Essential (primary) hypertension: Secondary | ICD-10-CM

## 2022-04-15 NOTE — Telephone Encounter (Signed)
Requested Prescriptions  Pending Prescriptions Disp Refills   atenolol (TENORMIN) 50 MG tablet [Pharmacy Med Name: ATENOLOL 50MG  TABLETS] 90 tablet 0    Sig: Take 1 tablet (50 mg total) by mouth daily.     Cardiovascular: Beta Blockers 2 Passed - 04/15/2022  6:15 AM      Passed - Cr in normal range and within 360 days    Creat  Date Value Ref Range Status  12/22/2021 0.95 0.70 - 1.22 mg/dL Final         Passed - Last BP in normal range    BP Readings from Last 1 Encounters:  12/29/21 128/66         Passed - Last Heart Rate in normal range    Pulse Readings from Last 1 Encounters:  12/29/21 73         Passed - Valid encounter within last 6 months    Recent Outpatient Visits           3 months ago Annual physical exam   [place], DO   4 months ago OSA on CPAP   [place], Alexander J, DO   6 months ago Essential hypertension   [facility] [place], V[place], RPH-CPP   9 months ago [facility] Olin Hauser, DO   1 year ago Psychophysiologic insomnia   [facility] [place], Devonne Doughty, [place]

## 2022-04-23 ENCOUNTER — Telehealth: Payer: Medicare PPO

## 2022-04-23 ENCOUNTER — Ambulatory Visit (INDEPENDENT_AMBULATORY_CARE_PROVIDER_SITE_OTHER): Payer: Medicare PPO

## 2022-04-23 DIAGNOSIS — I5022 Chronic systolic (congestive) heart failure: Secondary | ICD-10-CM

## 2022-04-23 DIAGNOSIS — I10 Essential (primary) hypertension: Secondary | ICD-10-CM

## 2022-04-23 NOTE — Patient Instructions (Signed)
Please call the care guide team at [phone] if you need to cancel or reschedule your appointment.   If you are experiencing a Mental Health or Behavioral Health Crisis or need someone to talk to, please call the Suicide and Crisis Lifeline: 988 call the [place] National Suicide Prevention Lifeline: 213-552-1113 or TTY: 579-867-3439 TTY (747)842-4534) to talk to a trained counselor call 1-800-273-TALK (toll free, 24 hour hotline)   Following is a copy of the CCM Program Consent:  CCM service includes personalized support from designated clinical staff supervised by the physician, including individualized plan of care and coordination with other care providers 24/7 contact phone numbers for assistance for urgent and routine care needs. Service will only be billed when office clinical staff spend 20 minutes or more in a month to coordinate care. Only one practitioner may furnish and bill the service in a calendar month. The patient may stop CCM services at amy time (effective at the end of the month) by phone call to the office staff. The patient will be responsible for cost sharing (co-pay) or up to 20% of the service fee (after annual deductible is met)  Following is a copy of your full provider care plan:   Goals Addressed             This Visit's Progress    CCM Expected Outcome:  Monitor, Self-Manage and Reduce Symptoms of Heart Failure       Current Barriers:  Knowledge Deficits related to effective management of HF Chronic Disease Management support and education needs related to effective management of HF Wt Readings from Last 3 Encounters:  03/05/22 200 lb (90.7 kg)  12/29/21 229 lb (103.9 kg)  11/25/21 239 lb (108.4 kg)     Planned Interventions: Basic overview and discussion of pathophysiology of Heart Failure reviewed. The patient has been steadily losing weight by the use of a product called "Lean Belly". The patient denies any issues with swelling or edema. The patient  states he is doing well and denies any new concerns with heart failure or heart health.  Provided education on low sodium diet. The patient is compliant with the heart healthy diet.  Reviewed Heart Failure Action Plan in depth. Knows how to effectively manage his heart failure. Assessed need for readable accurate scales in home. Has a scale. Provided education about placing scale on hard, flat surface Advised patient to weigh each morning after emptying bladder Discussed importance of daily weight and advised patient to weigh and record daily Reviewed role of diuretics in prevention of fluid overload and management of heart failure Discussed the importance of keeping all appointments with provider. Sees providers on a regular basis. Knows to call for changes or new needs. Education and support provided.  Provided patient with education about the role of exercise in the management of heart failure Advised patient to discuss changes in fluid balance or new concerns or questions related to heart failure with provider. The patient states he is stable.  The patient has had a new fall since last outreach. The patient fell down about 7 steps at home. The patient had hurt his foot back in March and it is still hurt and may even be worse since the fall. Will talk to the pcp for recommendations. The patient denies any acute distress today. Review of falls and safety concerns. The patient has a history of falls. The patient states he tries to be safe.   Symptom Management: Take medications as prescribed   Attend all  scheduled provider appointments Call pharmacy for medication refills 3-7 days in advance of running out of medications Perform all self care activities independently  Perform IADL's (shopping, preparing meals, housekeeping, managing finances) independently Call provider office for new concerns or questions  call the Suicide and Crisis Lifeline: 988 call the [place] National Suicide Prevention  Lifeline: 250-536-0152 or TTY: 971-663-5032 TTY 317-311-8140) to talk to a trained counselor call 1-800-273-TALK (toll free, 24 hour hotline) if experiencing a Mental Health or Behavioral Health Crisis   Follow Up Plan: Telephone follow up appointment with care management team member scheduled for: 06-24-2022 at 230 pm       CCM Expected Outcome:  Monitor, Self-Manage, and Reduce Symptoms of Hypertension       Current Barriers:  Knowledge Deficits related to effective management of HTN Chronic Disease Management support and education needs related to HTN and heart health BP Readings from Last 3 Encounters:  12/29/21 128/66  09/25/21 120/80  08/14/21 110/70     Planned Interventions: Evaluation of current treatment plan related to hypertension self management and patient's adherence to plan as established by provider. The patient is stable and denies any issues with HTN or heart health. The patient is losing weight. The patient is stable at this time.;   Provided education to patient re: stroke prevention, s/s of heart attack and stroke; Reviewed prescribed diet Heart Healthy. The patient is compliant with heart healthy diet. The patient is losing weight. He and his wife are taking a supplement called Lean Belly. He is down to 200.6. Feels great and is also going to the [facility] and doing the silver sneakers program. Praised the patient for positive changes.  Reviewed medications with patient and discussed importance of compliance. The patient is compliant with medications. The patient wife states she is still having some issues getting his medications together. She is working with the pharm D. She states he does not have any more refills on his atenolol. Education and support given.  Discussed plans with patient for ongoing care management follow up and provided patient with direct contact information for care management team; Advised patient, providing education and rationale, to monitor blood  pressure daily and record, calling PCP for findings outside established parameters. Blood pressures are stable. Denies any acute findings related to blood pressures or heart health;  Reviewed scheduled/upcoming provider appointments including: No upcoming appointments with the pcp. Knows to call for changes or needs. Will continue to monitor for new concerns or needs. Advised patient to discuss changes in HTN or heart health with provider; Provided education on prescribed diet heart healthy. Review and education provided;  Discussed complications of poorly controlled blood pressure such as heart disease, stroke, circulatory complications, vision complications, kidney impairment, sexual dysfunction;  Screening for signs and symptoms of depression related to chronic disease state;  Assessed social determinant of health barriers;  The patient asked about referral to the podiatrist due to some issues he is having with his foot that he had surgery on. Education provided about local podiatrist and number provided. Information provided and questions answered. The patient also can go to the [place]. The patient is still having issues with his feet and has had a new fall in March. The patient and the patients wife ask if the pcp thinks he should see a podiatrist or an orthopedic provider. Will collaborate with the pcp and ask for his recommendations. Will follow up accordingly with the patient and his wife after receiving recommendations from the pcp.  Symptom Management: Take medications as prescribed   Attend all scheduled provider appointments Call pharmacy for medication refills 3-7 days in advance of running out of medications Call provider office for new concerns or questions  call the Suicide and Crisis Lifeline: 988 call the [place] National Suicide Prevention Lifeline: (754)748-1911 or TTY: (754)538-4719 TTY (480)158-1822) to talk to a trained counselor call 1-800-273-TALK (toll free, 24 hour hotline) if  experiencing a Mental Health or Behavioral Health Crisis  check blood pressure weekly learn about high blood pressure call doctor for signs and symptoms of high blood pressure develop an action plan for high blood pressure keep all doctor appointments take medications for blood pressure exactly as prescribed report new symptoms to your doctor  Follow Up Plan: Telephone follow up appointment with care management team member scheduled for: 06-24-2022 at 230 pm          Patient verbalizes understanding of instructions and care plan provided today and agrees to view in MyChart. Active MyChart status and patient understanding of how to access instructions and care plan via MyChart confirmed with patient.  Telephone follow up appointment with care management team member scheduled for: 06-24-2022 at 230 pm

## 2022-04-23 NOTE — Chronic Care Management (AMB) (Signed)
Chronic Care Management   CCM RN Visit Note  04/23/2022 Name: Luke Mckee MRN: [id number] DOB: Jan 26, 1940  Subjective: Luke Mckee is a 82 y.o. year old male who is a primary care patient of [name], DO. The patient was referred to the Chronic Care Management team for assistance with care management needs subsequent to provider initiation of CCM services and plan of care.    Today's Visit:  Engaged with patient by telephone for follow up visit.        Goals Addressed             This Visit's Progress    CCM Expected Outcome:  Monitor, Self-Manage and Reduce Symptoms of Heart Failure       Current Barriers:  Knowledge Deficits related to effective management of HF Chronic Disease Management support and education needs related to effective management of HF Wt Readings from Last 3 Encounters:  03/05/22 200 lb (90.7 kg)  12/29/21 229 lb (103.9 kg)  11/25/21 239 lb (108.4 kg)     Planned Interventions: Basic overview and discussion of pathophysiology of Heart Failure reviewed. The patient has been steadily losing weight by the use of a product called "Lean Belly". The patient denies any issues with swelling or edema. The patient states he is doing well and denies any new concerns with heart failure or heart health.  Provided education on low sodium diet. The patient is compliant with the heart healthy diet.  Reviewed Heart Failure Action Plan in depth. Knows how to effectively manage his heart failure. Assessed need for readable accurate scales in home. Has a scale. Provided education about placing scale on hard, flat surface Advised patient to weigh each morning after emptying bladder Discussed importance of daily weight and advised patient to weigh and record daily Reviewed role of diuretics in prevention of fluid overload and management of heart failure Discussed the importance of keeping all appointments with provider. Sees providers on a regular basis. Knows  to call for changes or new needs. Education and support provided.  Provided patient with education about the role of exercise in the management of heart failure Advised patient to discuss changes in fluid balance or new concerns or questions related to heart failure with provider. The patient states he is stable.  The patient has had a new fall since last outreach. The patient fell down about 7 steps at home. The patient had hurt his foot back in March and it is still hurt and may even be worse since the fall. Will talk to the pcp for recommendations. The patient denies any acute distress today. Review of falls and safety concerns. The patient has a history of falls. The patient states he tries to be safe.   Symptom Management: Take medications as prescribed   Attend all scheduled provider appointments Call pharmacy for medication refills 3-7 days in advance of running out of medications Perform all self care activities independently  Perform IADL's (shopping, preparing meals, housekeeping, managing finances) independently Call provider office for new concerns or questions  call the Suicide and Crisis Lifeline: 988 call the [place] National Suicide Prevention Lifeline: 775-662-8964 or TTY: 253-143-2137 TTY (581)790-7388) to talk to a trained counselor call 1-800-273-TALK (toll free, 24 hour hotline) if experiencing a Mental Health or Behavioral Health Crisis   Follow Up Plan: Telephone follow up appointment with care management team member scheduled for: 06-24-2022 at 230 pm       CCM Expected Outcome:  Monitor, Self-Manage, and Reduce Symptoms of  Hypertension       Current Barriers:  Knowledge Deficits related to effective management of HTN Chronic Disease Management support and education needs related to HTN and heart health BP Readings from Last 3 Encounters:  12/29/21 128/66  09/25/21 120/80  08/14/21 110/70     Planned Interventions: Evaluation of current treatment plan related to  hypertension self management and patient's adherence to plan as established by provider. The patient is stable and denies any issues with HTN or heart health. The patient is losing weight. The patient is stable at this time.;   Provided education to patient re: stroke prevention, s/s of heart attack and stroke; Reviewed prescribed diet Heart Healthy. The patient is compliant with heart healthy diet. The patient is losing weight. He and his wife are taking a supplement called Lean Belly. He is down to 200.6. Feels great and is also going to the [facility] and doing the silver sneakers program. Praised the patient for positive changes.  Reviewed medications with patient and discussed importance of compliance. The patient is compliant with medications. The patient wife states she is still having some issues getting his medications together. She is working with the pharm D. She states he does not have any more refills on his atenolol. Education and support given.  Discussed plans with patient for ongoing care management follow up and provided patient with direct contact information for care management team; Advised patient, providing education and rationale, to monitor blood pressure daily and record, calling PCP for findings outside established parameters. Blood pressures are stable. Denies any acute findings related to blood pressures or heart health;  Reviewed scheduled/upcoming provider appointments including: No upcoming appointments with the pcp. Knows to call for changes or needs. Will continue to monitor for new concerns or needs. Advised patient to discuss changes in HTN or heart health with provider; Provided education on prescribed diet heart healthy. Review and education provided;  Discussed complications of poorly controlled blood pressure such as heart disease, stroke, circulatory complications, vision complications, kidney impairment, sexual dysfunction;  Screening for signs and symptoms of depression  related to chronic disease state;  Assessed social determinant of health barriers;  The patient asked about referral to the podiatrist due to some issues he is having with his foot that he had surgery on. Education provided about local podiatrist and number provided. Information provided and questions answered. The patient also can go to the [place]. The patient is still having issues with his feet and has had a new fall in March. The patient and the patients wife ask if the pcp thinks he should see a podiatrist or an orthopedic provider. Will collaborate with the pcp and ask for his recommendations. Will follow up accordingly with the patient and his wife after receiving recommendations from the pcp.   Symptom Management: Take medications as prescribed   Attend all scheduled provider appointments Call pharmacy for medication refills 3-7 days in advance of running out of medications Call provider office for new concerns or questions  call the Suicide and Crisis Lifeline: 988 call the [place] National Suicide Prevention Lifeline: 808-770-4308 or TTY: 307-644-5011 TTY 925-228-8776) to talk to a trained counselor call 1-800-273-TALK (toll free, 24 hour hotline) if experiencing a Mental Health or Behavioral Health Crisis  check blood pressure weekly learn about high blood pressure call doctor for signs and symptoms of high blood pressure develop an action plan for high blood pressure keep all doctor appointments take medications for blood pressure exactly as prescribed report new  symptoms to your doctor  Follow Up Plan: Telephone follow up appointment with care management team member scheduled for: 06-24-2022 at 230 pm          Plan:Telephone follow up appointment with care management team member scheduled for:  06-24-2022 at 230 pm  Alto Denver RN, MSN, CCM RN Care Manager  Chronic Care Management Direct Number: [phone]

## 2022-05-11 DIAGNOSIS — I5020 Unspecified systolic (congestive) heart failure: Secondary | ICD-10-CM | POA: Diagnosis not present

## 2022-05-11 DIAGNOSIS — I110 Hypertensive heart disease with heart failure: Secondary | ICD-10-CM | POA: Diagnosis not present

## 2022-05-11 DIAGNOSIS — G4733 Obstructive sleep apnea (adult) (pediatric): Secondary | ICD-10-CM | POA: Diagnosis not present

## 2022-06-03 ENCOUNTER — Other Ambulatory Visit: Payer: Self-pay | Admitting: Family Medicine

## 2022-06-03 DIAGNOSIS — E039 Hypothyroidism, unspecified: Secondary | ICD-10-CM

## 2022-06-03 NOTE — Telephone Encounter (Signed)
Rx 07/03/21 #90 3RF- too soon Requested Prescriptions  Pending Prescriptions Disp Refills   levothyroxine (SYNTHROID) 125 MCG tablet [Pharmacy Med Name: LEVOTHYROXINE 0.125MG  ( ) TAB] 90 tablet 3    Sig: TAKE 1 TABLET(125 MCG) BY MOUTH DAILY BEFORE BREAKFAST     Endocrinology:  Hypothyroid Agents Passed - 06/03/2022  1:24 PM      Passed - TSH in normal range and within 360 days    TSH  Date Value Ref Range Status  12/22/2021 3.67 0.40 - 4.50 mIU/L Final         Passed - Valid encounter within last 12 months    Recent Outpatient Visits           5 months ago Annual physical exam   [place] [facility] [place], Netta [name], DO   6 months ago OSA on CPAP   [place] [facility] [name], DO   8 months ago Essential hypertension   [place] [facility] Delles, Jackelyn Poling, RPH-CPP   11 months ago Pre-diabetes   [place] [facility] [name], DO   1 year ago Psychophysiologic insomnia   [place] [facility] [place], Netta [name], [place]

## 2022-06-05 ENCOUNTER — Other Ambulatory Visit: Payer: Self-pay | Admitting: Family Medicine

## 2022-06-05 DIAGNOSIS — I25810 Atherosclerosis of coronary artery bypass graft(s) without angina pectoris: Secondary | ICD-10-CM

## 2022-06-08 NOTE — Telephone Encounter (Signed)
Requested Prescriptions  Pending Prescriptions Disp Refills   furosemide (LASIX) 80 MG tablet [Pharmacy Med Name: FUROSEMIDE 80MG  TABLETS] 180 tablet 3    Sig: TAKE 1 TABLET(80 MG) BY MOUTH TWICE DAILY     Cardiovascular:  Diuretics - Loop Failed - 06/05/2022  5:10 PM      Failed - Mg Level in normal range and within 180 days    Magnesium  Date Value Ref Range Status  07/24/2018 2.2 1.7 - 2.4 mg/dL Final    Comment:    Performed at [facility], [address], [place], [place] [address]         Passed - K in normal range and within 180 days    Potassium  Date Value Ref Range Status  12/22/2021 3.8 3.5 - 5.3 mmol/L Final         Passed - Ca in normal range and within 180 days    Calcium  Date Value Ref Range Status  12/22/2021 9.2 8.6 - 10.3 mg/dL Final   Calcium, Ion  Date Value Ref Range Status  07/23/2018 1.09 (L) 1.15 - 1.40 mmol/L Final         Passed - Na in normal range and within 180 days    Sodium  Date Value Ref Range Status  12/22/2021 143 135 - 146 mmol/L Final         Passed - Cr in normal range and within 180 days    Creat  Date Value Ref Range Status  12/22/2021 0.95 0.70 - 1.22 mg/dL Final         Passed - Cl in normal range and within 180 days    Chloride  Date Value Ref Range Status  12/22/2021 103 98 - 110 mmol/L Final         Passed - Last BP in normal range    BP Readings from Last 1 Encounters:  12/29/21 128/66         Passed - Valid encounter within last 6 months    Recent Outpatient Visits           5 months ago Annual physical exam   [place] [facility] [place], Netta [name], DO   6 months ago OSA on CPAP   [place] [facility] [name], DO   8 months ago Essential hypertension   [place] [facility] Delles, Jackelyn Poling, RPH-CPP   11 months ago Pre-diabetes   [place] [facility] [name], DO   1 year ago  Psychophysiologic insomnia    [facility] [place], Netta [name], [place]

## 2022-06-10 DIAGNOSIS — G4733 Obstructive sleep apnea (adult) (pediatric): Secondary | ICD-10-CM | POA: Diagnosis not present

## 2022-06-11 ENCOUNTER — Telehealth: Payer: Medicare PPO

## 2022-06-11 ENCOUNTER — Telehealth: Payer: Self-pay | Admitting: Pharmacist

## 2022-06-11 NOTE — Telephone Encounter (Signed)
  Chronic Care Management   Outreach Note  06/11/2022 Name: Mckay Fitzner MRN: [id number] DOB: 02/01/1940  Referred by: [name], DO Reason for referral : No chief complaint on file.   Was unable to reach patient via telephone today and have left HIPAA compliant voicemail asking patient to return my call.    Follow Up Plan: Will collaborate with Care Guide to outreach to schedule follow up with me  Estelle Grumbles, PharmD, [facility] Clinical Pharmacist [facility] Care Management [phone]

## 2022-06-16 ENCOUNTER — Other Ambulatory Visit: Payer: Self-pay | Admitting: Family Medicine

## 2022-06-16 DIAGNOSIS — E039 Hypothyroidism, unspecified: Secondary | ICD-10-CM

## 2022-06-16 NOTE — Telephone Encounter (Signed)
Requested Prescriptions  Pending Prescriptions Disp Refills   levothyroxine (SYNTHROID) 125 MCG tablet [Pharmacy Med Name: LEVOTHYROXINE 0.125MG  ( ) TAB] 90 tablet 2    Sig: TAKE 1 TABLET(125 MCG) BY MOUTH DAILY BEFORE BREAKFAST     Endocrinology:  Hypothyroid Agents Passed - 06/16/2022 11:45 AM      Passed - TSH in normal range and within 360 days    TSH  Date Value Ref Range Status  12/22/2021 3.67 0.40 - 4.50 mIU/L Final         Passed - Valid encounter within last 12 months    Recent Outpatient Visits           5 months ago Annual physical exam   [place] [facility] [place], Netta [name], DO   6 months ago OSA on CPAP   [place] [facility] [name], DO   8 months ago Essential hypertension   [place] [facility] Delles, Jackelyn Poling, RPH-CPP   11 months ago Pre-diabetes   [place] [facility] [name], DO   1 year ago Psychophysiologic insomnia   [place] [facility] [place], Netta [name], DO       Future Appointments             In 5 days [name], Netta [name], DO [place] [facility] [place]

## 2022-06-16 NOTE — Telephone Encounter (Signed)
[facility] called and spoke to [place], Pensions consultant about the refill(s) levothyroxine requested. Advised it was sent on 07/03/21 #90/3 refill(s). She says the patient picked up the last refill in March and no refills are showing available. Advised it will be sent in.

## 2022-06-21 ENCOUNTER — Ambulatory Visit: Payer: Medicare PPO | Admitting: Family Medicine

## 2022-06-23 ENCOUNTER — Encounter: Payer: Self-pay | Admitting: Family Medicine

## 2022-06-23 ENCOUNTER — Ambulatory Visit (INDEPENDENT_AMBULATORY_CARE_PROVIDER_SITE_OTHER): Payer: Medicare PPO | Admitting: Family Medicine

## 2022-06-23 VITALS — BP 106/62 | HR 76 | Temp 96.6°F | Wt 217.0 lb

## 2022-06-23 DIAGNOSIS — I25118 Atherosclerotic heart disease of native coronary artery with other forms of angina pectoris: Secondary | ICD-10-CM

## 2022-06-23 DIAGNOSIS — I10 Essential (primary) hypertension: Secondary | ICD-10-CM | POA: Diagnosis not present

## 2022-06-23 DIAGNOSIS — I5022 Chronic systolic (congestive) heart failure: Secondary | ICD-10-CM

## 2022-06-23 DIAGNOSIS — F5104 Psychophysiologic insomnia: Secondary | ICD-10-CM

## 2022-06-23 DIAGNOSIS — R7303 Prediabetes: Secondary | ICD-10-CM | POA: Diagnosis not present

## 2022-06-23 DIAGNOSIS — F419 Anxiety disorder, unspecified: Secondary | ICD-10-CM

## 2022-06-23 DIAGNOSIS — J3089 Other allergic rhinitis: Secondary | ICD-10-CM

## 2022-06-23 DIAGNOSIS — H8113 Benign paroxysmal vertigo, bilateral: Secondary | ICD-10-CM

## 2022-06-23 DIAGNOSIS — E782 Mixed hyperlipidemia: Secondary | ICD-10-CM | POA: Diagnosis not present

## 2022-06-23 DIAGNOSIS — I25810 Atherosclerosis of coronary artery bypass graft(s) without angina pectoris: Secondary | ICD-10-CM | POA: Diagnosis not present

## 2022-06-23 DIAGNOSIS — M109 Gout, unspecified: Secondary | ICD-10-CM

## 2022-06-23 DIAGNOSIS — E039 Hypothyroidism, unspecified: Secondary | ICD-10-CM | POA: Diagnosis not present

## 2022-06-23 DIAGNOSIS — L84 Corns and callosities: Secondary | ICD-10-CM | POA: Diagnosis not present

## 2022-06-23 MED ORDER — CLOPIDOGREL BISULFATE 75 MG PO TABS: 75.0000 mg | ORAL_TABLET | Freq: Every day | ORAL | 3 refills | Status: DC

## 2022-06-23 MED ORDER — TRAZODONE HCL 50 MG PO TABS: 50.0000 mg | ORAL_TABLET | Freq: Every day | ORAL | 3 refills | Status: DC

## 2022-06-23 MED ORDER — ATORVASTATIN CALCIUM 80 MG PO TABS: 80.0000 mg | ORAL_TABLET | Freq: Every day | ORAL | 3 refills | Status: DC

## 2022-06-23 MED ORDER — SERTRALINE HCL 100 MG PO TABS
200.0000 mg | ORAL_TABLET | Freq: Every day | ORAL | 3 refills | Status: DC
Start: 2022-06-23 — End: 2022-09-27

## 2022-06-23 MED ORDER — COLCHICINE 0.6 MG PO TABS: ORAL_TABLET | ORAL | 2 refills | Status: AC

## 2022-06-23 MED ORDER — SPIRONOLACTONE 25 MG PO TABS
12.5000 mg | ORAL_TABLET | Freq: Every day | ORAL | 3 refills | Status: DC
Start: 2022-06-23 — End: 2022-08-05

## 2022-06-23 MED ORDER — MONTELUKAST SODIUM 10 MG PO TABS: 10.0000 mg | ORAL_TABLET | Freq: Every day | ORAL | 3 refills | Status: DC

## 2022-06-23 MED ORDER — LORAZEPAM 0.5 MG PO TABS
0.5000 mg | ORAL_TABLET | Freq: Every day | ORAL | 2 refills | Status: DC | PRN
Start: 2022-06-23 — End: 2023-07-11

## 2022-06-23 MED ORDER — ATENOLOL 50 MG PO TABS: 50.0000 mg | ORAL_TABLET | Freq: Every day | ORAL | 0 refills | Status: DC

## 2022-06-23 NOTE — Progress Notes (Signed)
Subjective:    Patient ID: Luke Mckee, male    DOB: 01/22/1940, 82 y.o.   MRN: [id number]  Luke Mckee is a 82 y.o. male presenting on 06/23/2022 for Ear Fullness and Toe Pain (Right big toe/)   HPI  Ear Pressure / Hearing Loss / Vestibular Dysfunction Recent worsening with R>L ear pressure and hearing loss worsening. Has hearing aids bilateral, through [place]. He will check back with them. Tried cleaning, no wax came out.  Episodic Back Pain / L Shoulder Pain Chronic problems, episodic flares. L shoulder > R Using topical Voltaren AS NEEDED  Right Great Toe Red Swollen Callus Tried topical Voltaren   History of CVA with residual deficit R sided weakness / multiple L MCA / TIA history HYPERTENSION Hyperlipidemia CAD s/p CABG with stable angina on nitrate  Followed by Neuro/Cards, see prior notes for background information. S/p loop recorder. No identified AFib. He had issue with stroke on Eliquis. Now he is back on DAPT Plavix + ASA 81mg  and doing better - Low BP by report, still taking diuretics due to edema per Cardiology - lasix 80 BID He has been taken off Imdur Isosorbide 30mg  daily, it was filled again but they're trying to discontinue it. Cardiology has stopped it Lipid panel is normal range. He has occasional dizziness mostly postural or movement related. Intermittent not consistent, not linked to low BP Has missed dose Lasix occasionally Denies any chest pain, new focal weakness, dyspnea, worsening edema, near syncope or syncope   Elevated A1c / PreDM Obesity BMI >32 Last result A1c 6.2 12/2020, stable range 6.1 to 6.3 Last lab today showed A1c 6.2, previously was at 6.1   Hypothyroidism Chronic problem Last lab now 12/2021 showed normal Thyroid panel and T4 He is taking Levothyroxine daily, currently doing well - with some improvement after med adjustment of changing dosing so does not take other meds with levothyroxine.   Anxiety / Depression recurrent  in partial remission - Interval update with improvement. - Today patient reports now taking Sertraline 100mg  x 2 = 200mg  and doing better, also has Lorazepam 0.5mg  as a PRN medication , needs refill   Feels more sedentary but goals to improve activity.   Psychological Insomnia Failed Ambien Improved on Trazodone nightly. On Sertraline    OSA on CPAP Followed by Pulmonology, no repeat sleep study - Today reports that sleep apnea is well controlled. He uses the CPAP machine every night. Tolerates the machine well, and thinks that sleeps better with it and feels good. No new concerns or symptoms.      06/23/2022    2:50 PM 12/29/2021    2:48 PM 09/25/2021    2:41 PM  Depression screen PHQ 2/9  Decreased Interest 0 1 0  Down, Depressed, Hopeless 0 2 1  PHQ - 2 Score 0 3 1  Altered sleeping 0 2 0  Tired, decreased energy 1 3 1   Change in appetite 0 1 0  Feeling bad or failure about yourself  0 1 0  Trouble concentrating 0 3 0  Moving slowly or fidgety/restless 0 1 0  Suicidal thoughts 0 0 0  PHQ-9 Score 1 14 2   Difficult doing work/chores Not difficult at all Somewhat difficult Not difficult at all    Social History   Tobacco Use   Smoking status: Never   Smokeless tobacco: Never  Vaping Use   Vaping Use: Never used  Substance Use Topics   Alcohol use: Not Currently  Comment: past   Drug use: Never    Review of Systems Per HPI unless specifically indicated above     Objective:    BP 106/62 (BP Location: Left Arm, Patient Position: Sitting, Cuff Size: Normal)   Pulse 76   Temp (!) 96.6 F (35.9 C) (Temporal)   Wt 217 lb (98.4 kg)   SpO2 95%   BMI 32.99 kg/m   Wt Readings from Last 3 Encounters:  06/23/22 217 lb (98.4 kg)  03/05/22 200 lb (90.7 kg)  12/29/21 229 lb (103.9 kg)    Physical Exam Vitals and nursing note reviewed.  Constitutional:      General: He is not in acute distress.    Appearance: He is well-developed. He is obese. He is not  diaphoretic.     Comments: Well-appearing, comfortable, cooperative  HENT:     Head: Normocephalic and atraumatic.  Eyes:     General:        Right eye: No discharge.        Left eye: No discharge.     Conjunctiva/sclera: Conjunctivae normal.     Pupils: Pupils are equal, round, and reactive to light.  Neck:     Thyroid: No thyromegaly.  Cardiovascular:     Rate and Rhythm: Normal rate and regular rhythm.     Pulses: Normal pulses.     Heart sounds: Normal heart sounds. No murmur heard. Pulmonary:     Effort: Pulmonary effort is normal. No respiratory distress.     Breath sounds: Normal breath sounds. No wheezing or rales.  Abdominal:     General: Bowel sounds are normal. There is no distension.     Palpations: Abdomen is soft. There is no mass.     Tenderness: There is no abdominal tenderness.     Hernia: A hernia (umbilical hernia) is present.  Musculoskeletal:        General: No tenderness. Normal range of motion.     Cervical back: Normal range of motion and neck supple.     Comments: Upper / Lower Extremities: - Normal muscle tone, strength bilateral upper extremities 5/5, lower extremities 5/5  Lymphadenopathy:     Cervical: No cervical adenopathy.  Skin:    General: Skin is warm and dry.     Findings: No erythema or rash.  Neurological:     Mental Status: He is alert and oriented to person, place, and time.     Comments: Distal sensation intact to light touch all extremities  Psychiatric:        Mood and Affect: Mood normal.        Behavior: Behavior normal.        Thought Content: Thought content normal.     Comments: Well groomed, good eye contact, normal speech and thoughts    Results for orders placed or performed in visit on 12/21/21  T4, free  Result Value Ref Range   Free T4 1.2 0.8 - 1.8 ng/dL  TSH  Result Value Ref Range   TSH 3.67 0.40 - 4.50 mIU/L  PSA  Result Value Ref Range   PSA 0.92 < OR = 4.00 ng/mL  Hemoglobin A1c  Result Value Ref Range    Hgb A1c MFr Bld 6.2 (H) <5.7 % of total Hgb   Mean Plasma Glucose 131 mg/dL   eAG (mmol/L) 7.3 mmol/L  Lipid panel  Result Value Ref Range   Cholesterol 127 <200 mg/dL   HDL 42 > OR = 40 mg/dL   Triglycerides 161 <  150 mg/dL   LDL Cholesterol (Calc) 62 mg/dL (calc)   Total CHOL/HDL Ratio 3.0 <5.0 (calc)   Non-HDL Cholesterol (Calc) 85 <161 mg/dL (calc)  CBC with Differential/Platelet  Result Value Ref Range   WBC 7.1 3.8 - 10.8 Thousand/uL   RBC 4.11 (L) 4.20 - 5.80 Million/uL   Hemoglobin 13.2 13.2 - 17.1 g/dL   HCT 09.6 04.5 - 40.9 %   MCV 95.9 80.0 - 100.0 fL   MCH 32.1 27.0 - 33.0 pg   MCHC 33.5 32.0 - 36.0 g/dL   RDW 81.1 91.4 - 78.2 %   Platelets 227 140 - 400 Thousand/uL   MPV 9.4 7.5 - 12.5 fL   Neutro Abs 4,537 1,500 - 7,800 cells/uL   Lymphs Abs 1,683 850 - 3,900 cells/uL   Absolute Monocytes 554 200 - 950 cells/uL   Eosinophils Absolute 249 15 - 500 cells/uL   Basophils Absolute 78 0 - 200 cells/uL   Neutrophils Relative % 63.9 %   Total Lymphocyte 23.7 %   Monocytes Relative 7.8 %   Eosinophils Relative 3.5 %   Basophils Relative 1.1 %  COMPLETE METABOLIC PANEL WITH GFR  Result Value Ref Range   Glucose, Bld 106 (H) 65 - 99 mg/dL   BUN 13 7 - 25 mg/dL   Creat 9.56 2.13 - 0.86 mg/dL   eGFR 81 > OR = 60 VH/QIO/9.62X5   BUN/Creatinine Ratio SEE NOTE: 6 - 22 (calc)   Sodium 143 135 - 146 mmol/L   Potassium 3.8 3.5 - 5.3 mmol/L   Chloride 103 98 - 110 mmol/L   CO2 30 20 - 32 mmol/L   Calcium 9.2 8.6 - 10.3 mg/dL   Total Protein 7.1 6.1 - 8.1 g/dL   Albumin 4.0 3.6 - 5.1 g/dL   Globulin 3.1 1.9 - 3.7 g/dL (calc)   AG Ratio 1.3 1.0 - 2.5 (calc)   Total Bilirubin 0.5 0.2 - 1.2 mg/dL   Alkaline phosphatase (APISO) 49 35 - 144 U/L   AST 15 10 - 35 U/L   ALT 12 9 - 46 U/L      Assessment & Plan:   Problem List Items Addressed This Visit     Anxiety   Relevant Medications   sertraline (ZOLOFT) 100 MG tablet   traZODone (DESYREL) 50 MG tablet    LORazepam (ATIVAN) 0.5 MG tablet   Atherosclerosis of native coronary artery of native heart with stable angina pectoris (HCC)   Relevant Medications   atenolol (TENORMIN) 50 MG tablet   atorvastatin (LIPITOR) 80 MG tablet   clopidogrel (PLAVIX) 75 MG tablet   sertraline (ZOLOFT) 100 MG tablet   spironolactone (ALDACTONE) 25 MG tablet   traZODone (DESYREL) 50 MG tablet   CAD (coronary artery disease)   Relevant Medications   atenolol (TENORMIN) 50 MG tablet   atorvastatin (LIPITOR) 80 MG tablet   spironolactone (ALDACTONE) 25 MG tablet   Hypothyroidism   Relevant Medications   atenolol (TENORMIN) 50 MG tablet   Mixed hyperlipidemia   Relevant Medications   atenolol (TENORMIN) 50 MG tablet   atorvastatin (LIPITOR) 80 MG tablet   spironolactone (ALDACTONE) 25 MG tablet   Pre-diabetes   Systolic CHF (HCC)   Relevant Medications   atenolol (TENORMIN) 50 MG tablet   atorvastatin (LIPITOR) 80 MG tablet   spironolactone (ALDACTONE) 25 MG tablet   Other Visit Diagnoses     Essential hypertension    -  Primary   Relevant Medications   atenolol (TENORMIN) 50  MG tablet   atorvastatin (LIPITOR) 80 MG tablet   spironolactone (ALDACTONE) 25 MG tablet   BPPV (benign paroxysmal positional vertigo), bilateral       Callus of toe       Relevant Orders   Ambulatory referral to Podiatry   Acute gout involving toe of right foot, unspecified cause       Relevant Medications   colchicine 0.6 MG tablet   Other Relevant Orders   Ambulatory referral to Podiatry   Seasonal allergic rhinitis due to other allergic trigger       Relevant Medications   montelukast (SINGULAIR) 10 MG tablet   Psychophysiologic insomnia       Relevant Medications   traZODone (DESYREL) 50 MG tablet       Refilled medications  R Great Toe Gout Flare Add Colchicine for gout flare For acute gout flare take 2 pills at once, then take a 3rd pill 2 hours later. Then take 1 pill daily for 7-10 days or until resolved.    [facility]erral to [facility]  Recommend Voltaren START anti inflammatory topical - OTC Voltaren (generic Diclofenac) topical 2-4 times a day as needed for pain swelling of affected joint for 1-2 weeks or longer.  Left Shoulder as needed.  Ears looks clean. No wax.  Some chronic scar tissue on the ear drums, RIGHT worse than Left.  Maybe mild fluid behind ear drum.  Recommend that you check with ENT at the [facility] and see if they can evaluate the hearing aids and inner ears.  Possibly the balance is with a Vestibular Disorder  Orders Placed This Encounter  Procedures   Ambulatory referral to Podiatry    Referral Priority:   Routine    Referral Type:   Consultation    Referral Reason:   Specialty Services Required    Requested Specialty:   Podiatry    Number of Visits Requested:   1     Meds ordered this encounter  Medications   colchicine 0.6 MG tablet    Sig: For acute gout flare take 2 pills at once, then take a 3rd pill 2 hours later. Then take 1 pill daily for 7-10 days or until resolved.    Dispense:  30 tablet    Refill:  2   atenolol (TENORMIN) 50 MG tablet    Sig: Take 1 tablet (50 mg total) by mouth daily.    Dispense:  90 tablet    Refill:  0   atorvastatin (LIPITOR) 80 MG tablet    Sig: Take 1 tablet (80 mg total) by mouth daily.    Dispense:  90 tablet    Refill:  3   clopidogrel (PLAVIX) 75 MG tablet    Sig: Take 1 tablet (75 mg total) by mouth daily.    Dispense:  90 tablet    Refill:  3   montelukast (SINGULAIR) 10 MG tablet    Sig: Take 1 tablet (10 mg total) by mouth at bedtime.    Dispense:  90 tablet    Refill:  3   sertraline (ZOLOFT) 100 MG tablet    Sig: Take 2 tablets (200 mg total) by mouth daily.    Dispense:  180 tablet    Refill:  3   spironolactone (ALDACTONE) 25 MG tablet    Sig: Take 0.5 tablets (12.5 mg total) by mouth daily.    Dispense:  45 tablet    Refill:  3   traZODone (DESYREL) 50 MG tablet  Sig: Take 1 tablet  (50 mg total) by mouth at bedtime.    Dispense:  90 tablet    Refill:  3   LORazepam (ATIVAN) 0.5 MG tablet    Sig: Take 1 tablet (0.5 mg total) by mouth daily as needed for anxiety or sleep.    Dispense:  30 tablet    Refill:  2      Follow up plan: Return in about 4 months (around 10/23/2022) for 4 month follow-up.   Saralyn Pilar, DO [facility] 06/23/2022, 3:03 PM

## 2022-06-23 NOTE — Patient Instructions (Addendum)
Thank you for coming to the office today.  Refilled medications  Add Colchicine for gout flare For acute gout flare take 2 pills at once, then take a 3rd pill 2 hours later. Then take 1 pill daily for 7-10 days or until resolved.   [facility]  [address] [place], [place] [address] Hours - M-F 8-5 Phone: [phone] phone   Recommend Voltaren START anti inflammatory topical - OTC Voltaren (generic Diclofenac) topical 2-4 times a day as needed for pain swelling of affected joint for 1-2 weeks or longer.  Left Shoulder as needed.  -------------------  Ears looks clean. No wax.  Some chronic scar tissue on the ear drums, RIGHT worse than Left.  Maybe mild fluid behind ear drum.  Recommend that you check with ENT at the [facility] and see if they can evaluate the hearing aids and inner ears.  Possibly the balance is with a Vestibular Disorder  You have symptoms of Vertigo (Benign Paroxysmal Positional Vertigo) - This is commonly caused by inner ear fluid imbalance, sometimes can be worsened by allergies and sinus symptoms, otherwise it can occur randomly sometimes and we may never discover the exact cause. - To treat this, try the Epley Manuever (see diagrams/instructions below) at home up to 3 times a day for 1-2 weeks or until symptoms resolve  If you develop significant worsening episode with vertigo that does not improve and you get severe headache, loss of vision, arm or leg weakness, slurred speech, or other concerning symptoms please seek immediate medical attention at Emergency Department.   See the next page for images describing the Epley Manuever.     ----------------------------------------------------------------------------------------------------------------------         Please schedule a Follow-up Appointment to: Return in about 4 months (around 10/23/2022) for 4 month follow-up.  If you have any other questions or concerns, please  feel free to call the office or send a message through MyChart. You may also schedule an earlier appointment if necessary.  Additionally, you may be receiving a survey about your experience at our office within a few days to 1 week by e-mail or mail. We value your feedback.  Saralyn Pilar, DO [place], [place]

## 2022-06-24 ENCOUNTER — Ambulatory Visit (INDEPENDENT_AMBULATORY_CARE_PROVIDER_SITE_OTHER): Payer: Medicare PPO

## 2022-06-24 ENCOUNTER — Telehealth: Payer: Medicare PPO

## 2022-06-24 ENCOUNTER — Encounter: Payer: Self-pay | Admitting: Family Medicine

## 2022-06-24 DIAGNOSIS — I10 Essential (primary) hypertension: Secondary | ICD-10-CM

## 2022-06-24 DIAGNOSIS — I5022 Chronic systolic (congestive) heart failure: Secondary | ICD-10-CM

## 2022-06-24 NOTE — Chronic Care Management (AMB) (Signed)
Chronic Care Management   CCM RN Visit Note  06/24/2022 Name: Luke Mckee MRN: [id number] DOB: [birth year]  Subjective: Luke Mckee is a 82 y.o. year old male who is a primary care patient of [name], DO. The patient was referred to the Chronic Care Management team for assistance with care management needs subsequent to provider initiation of CCM services and plan of care.    Today's Visit:  Engaged with patient by telephone for follow up visit.        Goals Addressed             This Visit's Progress    CCM Expected Outcome:  Monitor, Self-Manage and Reduce Symptoms of Heart Failure       Current Barriers:  Knowledge Deficits related to effective management of HF Chronic Disease Management support and education needs related to effective management of HF Wt Readings from Last 3 Encounters:  06/23/22 217 lb (98.4 kg)  03/05/22 200 lb (90.7 kg)  12/29/21 229 lb (103.9 kg)     Planned Interventions: Basic overview and discussion of pathophysiology of Heart Failure reviewed. The patient has been steadily losing weight by the use of a product called "Lean Belly". The patient denies any issues with swelling or edema. The patient states he is doing well and denies any new concerns with heart failure or heart health. Review and patient continues to do well with management of his heart failure.  Provided education on low sodium diet. The patient is compliant with the heart healthy diet.  Reviewed Heart Failure Action Plan in depth. Knows how to effectively manage his heart failure. Assessed need for readable accurate scales in home. Has a scale. Provided education about placing scale on hard, flat surface Advised patient to weigh each morning after emptying bladder Discussed importance of daily weight and advised patient to weigh and record daily Reviewed role of diuretics in prevention of fluid overload and management of heart failure Discussed the importance of  keeping all appointments with provider. Sees providers on a regular basis. Knows to call for changes or new needs. Education and support provided.  Provided patient with education about the role of exercise in the management of heart failure Advised patient to discuss changes in fluid balance or new concerns or questions related to heart failure with provider. The patient states he is stable.  The patient has had a new fall since last outreach. The patient fell down about 7 steps at home. The patient had hurt his foot back in March and it is still hurt and may even be worse since the fall. Will talk to the pcp for recommendations. The patient denies any acute distress today. Review of falls and safety concerns. The patient has a history of falls. The patient states he tries to be safe.  The patient is interested in a life alert system for him and his wife. Information provided and gave additional resources for life alert system in the event that his insurance does not offer options.   Symptom Management: Take medications as prescribed   Attend all scheduled provider appointments Call pharmacy for medication refills 3-7 days in advance of running out of medications Perform all self care activities independently  Perform IADL's (shopping, preparing meals, housekeeping, managing finances) independently Call provider office for new concerns or questions  call the Suicide and Crisis Lifeline: 988 call the [place] National Suicide Prevention Lifeline: (929) 277-1355 or TTY: 214-293-4192 TTY 629 750 9246) to talk to a trained counselor call 1-800-273-TALK (toll free,  24 hour hotline) if experiencing a Mental Health or Behavioral Health Crisis   Follow Up Plan: Telephone follow up appointment with care management team member scheduled for: 08-02-2022 at 145 pm       CCM Expected Outcome:  Monitor, Self-Manage, and Reduce Symptoms of Hypertension       Current Barriers:  Knowledge Deficits related to  effective management of HTN Chronic Disease Management support and education needs related to HTN and heart health BP Readings from Last 3 Encounters:  06/23/22 106/62  12/29/21 128/66  09/25/21 120/80     Planned Interventions: Evaluation of current treatment plan related to hypertension self management and patient's adherence to plan as established by provider. The patient is stable and denies any issues with HTN or heart health. The patient is losing weight. The patient is stable at this time.;   Provided education to patient re: stroke prevention, s/s of heart attack and stroke; Reviewed prescribed diet Heart Healthy. The patient is compliant with heart healthy diet. The patient is losing weight. He and his wife are taking a supplement called Lean Belly. He is at 217. Feels great and is also going to the [facility] and doing the silver sneakers program. Praised the patient for positive changes.  Reviewed medications with patient and discussed importance of compliance. The patient is compliant with medications. The patient and his wife work with the pharm D on a regular basis. Denies any acute findings today.  Discussed plans with patient for ongoing care management follow up and provided patient with direct contact information for care management team; Advised patient, providing education and rationale, to monitor blood pressure daily and record, calling PCP for findings outside established parameters. Blood pressures are stable. Denies any acute findings related to blood pressures or heart health;  Reviewed scheduled/upcoming provider appointments including: Saw pcp on 06-23-2022 for gout flare up and fluid in ear. He has consults in place.  Advised patient to discuss changes in HTN or heart health with provider; Provided education on prescribed diet heart healthy. Review and education provided;  Discussed complications of poorly controlled blood pressure such as heart disease, stroke, circulatory  complications, vision complications, kidney impairment, sexual dysfunction;  Screening for signs and symptoms of depression related to chronic disease state;  Assessed social determinant of health barriers;  The patient asked about referral to the podiatrist due to some issues he is having with his foot that he had surgery on. Education provided about local podiatrist and number provided. Information provided and questions answered. The patient also can go to the [place]. The patient is still having issues with his feet and has had a new fall in March. The patient and the patients wife ask if the pcp thinks he should see a podiatrist or Luke orthopedic provider. Will collaborate with the pcp and ask for his recommendations. Will follow up accordingly with the patient and his wife after receiving recommendations from the pcp.  The patients family is helping more with the patient needs. Their granddaughter will be going with them to all of their upcoming appointments.   Symptom Management: Take medications as prescribed   Attend all scheduled provider appointments Call pharmacy for medication refills 3-7 days in advance of running out of medications Call provider office for new concerns or questions  call the Suicide and Crisis Lifeline: 988 call the [place] National Suicide Prevention Lifeline: 867-398-9419 or TTY: 313-049-7576 TTY 954-372-0197) to talk to a trained counselor call 1-800-273-TALK (toll free, 24 hour hotline) if experiencing  a Mental Health or Behavioral Health Crisis  check blood pressure weekly learn about high blood pressure call doctor for signs and symptoms of high blood pressure develop Luke action plan for high blood pressure keep all doctor appointments take medications for blood pressure exactly as prescribed report new symptoms to your doctor  Follow Up Plan: Telephone follow up appointment with care management team member scheduled for: 08-02-2022 at 145 pm           Plan:Telephone follow up appointment with care management team member scheduled for:  08-02-2022 at 145 pm  Alto Denver RN, MSN, CCM RN Care Manager  Chronic Care Management Direct Number: [phone]

## 2022-06-24 NOTE — Patient Instructions (Signed)
Please call the care guide team at [phone] if you need to cancel or reschedule your appointment.   If you are experiencing a Mental Health or Behavioral Health Crisis or need someone to talk to, please call the Suicide and Crisis Lifeline: 988 call the [place] National Suicide Prevention Lifeline: (780) 448-0453 or TTY: 479-105-6122 TTY (623)732-8945) to talk to a trained counselor call 1-800-273-TALK (toll free, 24 hour hotline)   Following is a copy of the CCM Program Consent:  CCM service includes personalized support from designated clinical staff supervised by the physician, including individualized plan of care and coordination with other care providers 24/7 contact phone numbers for assistance for urgent and routine care needs. Service will only be billed when office clinical staff spend 20 minutes or more in a month to coordinate care. Only one practitioner may furnish and bill the service in a calendar month. The patient may stop CCM services at amy time (effective at the end of the month) by phone call to the office staff. The patient will be responsible for cost sharing (co-pay) or up to 20% of the service fee (after annual deductible is met)  Following is a copy of your full provider care plan:   Goals Addressed             This Visit's Progress    CCM Expected Outcome:  Monitor, Self-Manage and Reduce Symptoms of Heart Failure       Current Barriers:  Knowledge Deficits related to effective management of HF Chronic Disease Management support and education needs related to effective management of HF Wt Readings from Last 3 Encounters:  06/23/22 217 lb (98.4 kg)  03/05/22 200 lb (90.7 kg)  12/29/21 229 lb (103.9 kg)     Planned Interventions: Basic overview and discussion of pathophysiology of Heart Failure reviewed. The patient has been steadily losing weight by the use of a product called "Lean Belly". The patient denies any issues with swelling or edema. The patient  states he is doing well and denies any new concerns with heart failure or heart health. Review and patient continues to do well with management of his heart failure.  Provided education on low sodium diet. The patient is compliant with the heart healthy diet.  Reviewed Heart Failure Action Plan in depth. Knows how to effectively manage his heart failure. Assessed need for readable accurate scales in home. Has a scale. Provided education about placing scale on hard, flat surface Advised patient to weigh each morning after emptying bladder Discussed importance of daily weight and advised patient to weigh and record daily Reviewed role of diuretics in prevention of fluid overload and management of heart failure Discussed the importance of keeping all appointments with provider. Sees providers on a regular basis. Knows to call for changes or new needs. Education and support provided.  Provided patient with education about the role of exercise in the management of heart failure Advised patient to discuss changes in fluid balance or new concerns or questions related to heart failure with provider. The patient states he is stable.  The patient has had a new fall since last outreach. The patient fell down about 7 steps at home. The patient had hurt his foot back in March and it is still hurt and may even be worse since the fall. Will talk to the pcp for recommendations. The patient denies any acute distress today. Review of falls and safety concerns. The patient has a history of falls. The patient states he tries to be  safe.  The patient is interested in a life alert system for him and his wife. Information provided and gave additional resources for life alert system in the event that his insurance does not offer options.   Symptom Management: Take medications as prescribed   Attend all scheduled provider appointments Call pharmacy for medication refills 3-7 days in advance of running out of  medications Perform all self care activities independently  Perform IADL's (shopping, preparing meals, housekeeping, managing finances) independently Call provider office for new concerns or questions  call the Suicide and Crisis Lifeline: 988 call the [place] National Suicide Prevention Lifeline: 208 305 6036 or TTY: 806-407-6226 TTY 315 264 5290) to talk to a trained counselor call 1-800-273-TALK (toll free, 24 hour hotline) if experiencing a Mental Health or Behavioral Health Crisis   Follow Up Plan: Telephone follow up appointment with care management team member scheduled for: 08-02-2022 at 145 pm       CCM Expected Outcome:  Monitor, Self-Manage, and Reduce Symptoms of Hypertension       Current Barriers:  Knowledge Deficits related to effective management of HTN Chronic Disease Management support and education needs related to HTN and heart health BP Readings from Last 3 Encounters:  06/23/22 106/62  12/29/21 128/66  09/25/21 120/80     Planned Interventions: Evaluation of current treatment plan related to hypertension self management and patient's adherence to plan as established by provider. The patient is stable and denies any issues with HTN or heart health. The patient is losing weight. The patient is stable at this time.;   Provided education to patient re: stroke prevention, s/s of heart attack and stroke; Reviewed prescribed diet Heart Healthy. The patient is compliant with heart healthy diet. The patient is losing weight. He and his wife are taking a supplement called Lean Belly. He is at 217. Feels great and is also going to the [facility] and doing the silver sneakers program. Praised the patient for positive changes.  Reviewed medications with patient and discussed importance of compliance. The patient is compliant with medications. The patient and his wife work with the pharm D on a regular basis. Denies any acute findings today.  Discussed plans with patient for ongoing care  management follow up and provided patient with direct contact information for care management team; Advised patient, providing education and rationale, to monitor blood pressure daily and record, calling PCP for findings outside established parameters. Blood pressures are stable. Denies any acute findings related to blood pressures or heart health;  Reviewed scheduled/upcoming provider appointments including: Saw pcp on 06-23-2022 for gout flare up and fluid in ear. He has consults in place.  Advised patient to discuss changes in HTN or heart health with provider; Provided education on prescribed diet heart healthy. Review and education provided;  Discussed complications of poorly controlled blood pressure such as heart disease, stroke, circulatory complications, vision complications, kidney impairment, sexual dysfunction;  Screening for signs and symptoms of depression related to chronic disease state;  Assessed social determinant of health barriers;  The patient asked about referral to the podiatrist due to some issues he is having with his foot that he had surgery on. Education provided about local podiatrist and number provided. Information provided and questions answered. The patient also can go to the [place]. The patient is still having issues with his feet and has had a new fall in March. The patient and the patients wife ask if the pcp thinks he should see a podiatrist or an orthopedic provider. Will collaborate with  the pcp and ask for his recommendations. Will follow up accordingly with the patient and his wife after receiving recommendations from the pcp.  The patients family is helping more with the patient needs. Their granddaughter will be going with them to all of their upcoming appointments.   Symptom Management: Take medications as prescribed   Attend all scheduled provider appointments Call pharmacy for medication refills 3-7 days in advance of running out of medications Call provider  office for new concerns or questions  call the Suicide and Crisis Lifeline: 988 call the [place] National Suicide Prevention Lifeline: 786-653-6119 or TTY: (989)252-2789 TTY 559-798-6280) to talk to a trained counselor call 1-800-273-TALK (toll free, 24 hour hotline) if experiencing a Mental Health or Behavioral Health Crisis  check blood pressure weekly learn about high blood pressure call doctor for signs and symptoms of high blood pressure develop an action plan for high blood pressure keep all doctor appointments take medications for blood pressure exactly as prescribed report new symptoms to your doctor  Follow Up Plan: Telephone follow up appointment with care management team member scheduled for: 08-02-2022 at 145 pm          Patient verbalizes understanding of instructions and care plan provided today and agrees to view in MyChart. Active MyChart status and patient understanding of how to access instructions and care plan via MyChart confirmed with patient.  Telephone follow up appointment with care management team member scheduled for: 08-02-2022 at 145 pm   Alto Denver RN, MSN, CCM RN Care Manager  Chronic Care Management Direct Number: [phone]

## 2022-07-05 ENCOUNTER — Ambulatory Visit: Payer: Medicare PPO | Admitting: Pharmacist

## 2022-07-05 DIAGNOSIS — J3089 Other allergic rhinitis: Secondary | ICD-10-CM

## 2022-07-05 DIAGNOSIS — I10 Essential (primary) hypertension: Secondary | ICD-10-CM

## 2022-07-05 NOTE — Chronic Care Management (AMB) (Signed)
Chronic Care Management CCM Pharmacy Note  07/05/2022 Name:  Luke Mckee MRN:  [id number] DOB:  [birth year]   Subjective: Luke Mckee is an 82 y.o. year old male who is a primary patient of [name], DO.  The CCM team was consulted for assistance with disease management and care coordination needs.    Engaged with patient by telephone for follow up visit for pharmacy case management and/or care coordination services.   Objective:  Medications Reviewed Today     Reviewed by [name], RN (Case Manager) on 06/24/22 at 1509  Med List Status: <None>   Medication Order Taking? Sig Documenting Provider Last Dose Status Informant  acetaminophen (TYLENOL) 650 MG CR tablet [id number] No Take 1,300 mg by mouth at bedtime as needed. [provider] Taking Active Spouse/Significant Other  albuterol (VENTOLIN HFA) 108 (90 Base) MCG/ACT inhaler [id number] No Inhale 1-2 puffs into the lungs every 4 (four) hours as needed for wheezing or shortness of breath (cough). [name], DO Taking Active   aspirin EC 81 MG tablet [id number] No Take 81 mg by mouth daily. [provider] Taking Active   atenolol (TENORMIN) 50 MG tablet [id number]  Take 1 tablet (50 mg total) by mouth daily. [name], DO  Active   atorvastatin (LIPITOR) 80 MG tablet [id number]  Take 1 tablet (80 mg total) by mouth daily. [name], DO  Active   Calcium Carb-Cholecalciferol (CALCIUM 600 + D PO) [id number] No Take 1 tablet by mouth daily. [provider] Taking Active   Cholecalciferol (VITAMIN D) 50 MCG (2000 UT) tablet [id number] No Take 2,000 Units by mouth daily. [provider] Taking Active Spouse/Significant Other  clopidogrel (PLAVIX) 75 MG tablet [id number]  Take 1 tablet (75 mg total) by mouth daily. Karamalegos, Netta [name], DO  Active   clotrimazole-betamethasone (LOTRISONE) cream [id number] No Apply twice a day for 1-2 weeks, may  repeat if need in future  Patient not taking: Reported on 06/23/2022   [name], DO Not Taking Active   co-enzyme Q-10 30 MG capsule [id number] No Take 100 mg by mouth daily. [provider] Taking Active   colchicine 0.6 MG tablet [id number]  For acute gout flare take 2 pills at once, then take a 3rd pill 2 hours later. Then take 1 pill daily for 7-10 days or until resolved. [name], DO  Active   diclofenac sodium (VOLTAREN) 1 % GEL [id number] No Apply 2 g topically 4 (four) times daily as needed for pain. Foot pain [provider] Taking Active Spouse/Significant Other  furosemide (LASIX) 80 MG tablet [id number] No TAKE 1 TABLET(80 MG) BY MOUTH TWICE DAILY Karamalegos, Netta [name], DO Taking Active   ipratropium (ATROVENT) 0.06 % nasal spray [id number] No Place 2 sprays into both nostrils 4 (four) times daily as needed for rhinitis. [name], DO Taking Active   levothyroxine (SYNTHROID) 125 MCG tablet [id number] No TAKE 1 TABLET(125 MCG) BY MOUTH DAILY BEFORE BREAKFAST Karamalegos, Netta [name], DO Taking Active   loratadine (CLARITIN) 10 MG tablet [id number] No Take 10 mg by mouth daily. [provider] Taking Active   LORazepam (ATIVAN) 0.5 MG tablet [id number]  Take 1 tablet (0.5 mg total) by mouth daily as needed for anxiety or sleep. Karamalegos, Netta [name], DO  Active   montelukast (SINGULAIR) 10 MG tablet [id number]  Take 1 tablet (10 mg total) by mouth at bedtime. [name], DO  Active   Multiple Vitamin (  MULTIVITAMIN) tablet [id number] No Take 1 tablet by mouth daily. [provider] Taking Active Spouse/Significant Other  nitroGLYCERIN (NITROSTAT) 0.4 MG SL tablet [id number] No Place 1 tablet (0.4 mg total) under the tongue every 5 (five) minutes as needed for chest pain. [name], DO Taking Active   PATADAY 0.1 % ophthalmic solution [id number] No Place into both eyes as needed.  [provider] Taking Active   sennosides-docusate sodium (SENOKOT-S) 8.6-50 MG tablet [id number] No Take 1-2 tablets by mouth daily as needed for constipation. [provider] Taking Active   sertraline (ZOLOFT) 100 MG tablet [id number]  Take 2 tablets (200 mg total) by mouth daily. [name], DO  Active   spironolactone (ALDACTONE) 25 MG tablet [id number]  Take 0.5 tablets (12.5 mg total) by mouth daily. [name], DO  Active   traZODone (DESYREL) 50 MG tablet [id number]  Take 1 tablet (50 mg total) by mouth at bedtime. [name], DO  Active             Pertinent Labs:    Lab Results  Component Value Date   CHOL 127 12/22/2021   HDL 42 12/22/2021   LDLCALC 62 12/22/2021   TRIG 143 12/22/2021   CHOLHDL 3.0 12/22/2021   Lab Results  Component Value Date   CREATININE 0.95 12/22/2021   BUN 13 12/22/2021   NA 143 12/22/2021   K 3.8 12/22/2021   CL 103 12/22/2021   CO2 30 12/22/2021   BP Readings from Last 3 Encounters:  06/23/22 106/62  12/29/21 128/66  09/25/21 120/80   Pulse Readings from Last 3 Encounters:  06/23/22 76  12/29/21 73  08/14/21 63     SDOH:  (Social Determinants of Health) assessments and interventions performed:  SDOH Interventions    Flowsheet Row Office Visit from 06/23/2022 in [facility] Office Visit from 12/29/2021 in [facility] Chronic Care Management from 11/03/2021 in [facility] Clinical Support from 09/25/2021 in [facility] Care Coordination from 09/07/2021 in [facility] Care Coordination Care Coordination from 08/24/2021 in [facility] Community Care Coordination  SDOH Interventions        Food Insecurity Interventions -- -- -- Intervention Not Indicated Intervention Not Indicated Intervention Not Indicated  Housing Interventions -- -- --  Intervention Not Indicated Intervention Not Indicated Intervention Not Indicated  Transportation Interventions -- -- Intervention Not Indicated Intervention Not Indicated Intervention Not Indicated Intervention Not Indicated  Utilities Interventions -- -- Intervention Not Indicated Intervention Not Indicated -- --  Alcohol Usage Interventions -- -- -- Intervention Not Indicated (Score <7) -- --  Depression Interventions/Treatment  PHQ2-9 Score <4 Follow-up Not Indicated Medication, Currently on Treatment -- Medication -- --  Financial Strain Interventions -- -- -- Intervention Not Indicated -- --  Physical Activity Interventions -- -- -- Intervention Not Indicated -- --  Stress Interventions -- -- -- Intervention Not Indicated -- --  Social Connections Interventions -- -- -- -- -- Intervention Not Indicated       CCM Care Plan  Review of patient past medical history, allergies, medications, health status, including review of consultants reports, laboratory and other test data, was performed as part of comprehensive evaluation and provision of chronic care management services.   Care Plan : PharmD - Med Management  Updates made by Manuela Neptune, RPH-CPP since 07/05/2022 12:00 AM     Problem: Disease  Progression      Long-Range Goal: Disease Progression Prevented or Minimized   Start Date: 02/04/2020  Expected End Date: 05/04/2020  Recent Progress: On track  Priority: High  Note:   Current Barriers:  Chronic Disease Management support, education, and care coordination needs related to CAD, HTN, HLD, and hx CVA  Pharmacist Clinical Goal(s):  Over the next 90 days, patient will adhere to plan to optimize therapeutic regimen for hypothyroidism as evidenced by report of adherence to recommended medication management changes through collaboration with PharmD and provider.    Interventions: 1:1 collaboration with [name], DO regarding development and update of  comprehensive plan of care as evidenced by provider attestation and co-signature Inter-disciplinary care team collaboration (see longitudinal plan of care) Perform chart review. Patient seen for Office Visit with PCP on 06/23/2022 related to Ear Fullness and Toe Pain. Provider advised patient: Add Colchicine for gout flare - For acute gout flare take 2 pills at once, then take a 3rd pill 2 hours later. Then take 1 pill daily for 7-10 days or until resolved Referral placed to [facility]  START anti inflammatory topical - OTC Voltaren (generic Diclofenac) topical 2-4 times a day as needed for pain swelling of affected joint for 1-2 weeks or longer Check with ENT at the [facility] and see if they can evaluate the hearing aids and inner ears Today patient reports gout flare resolved with use of colchicine Provide patient with contact information to follow up with [facility]  [facility] but will [facility]  Hypertension Controlled; current treatment: Atenolol 50 mg daily in the morning Furosemide 80 mg twice daily Spironolactone 25 mg - 1/2 tablet (12.5 mg) once daily Denies checking home blood pressure recently Remind patient to take positional changes slowly Encourage to monitor home BP as directed by Cardiologist, keep log of results and call providers for readings outside of established parameters or symptoms   Patient Goals/Self-Care Activities Over the next 90 days, patient will:  - take medications as prescribed with assistance of wife   Note: patient uses weekly pillbox as filled by wife - check blood pressure, document, and provide at future appointments        Plan: Telephone follow up appointment with care management team member scheduled for:  09/27/2022 at 2:30 pm  Estelle Grumbles, PharmD, P[place]y [place], CPP Clinical Pharmacist [facility] [phone]

## 2022-07-05 NOTE — Patient Instructions (Signed)
Visit Information  Thank you for taking time to visit with me today. Please don't hesitate to contact me if I can be of assistance to you before our next scheduled telephone appointment.  Following are the goals we discussed today:   Goals Addressed             This Visit's Progress    Pharmacy Goals       It was great talking with you today!  Please check your blood pressure 1-2 times/week at home and keep a record to bring to medical appointments  Our goal bad cholesterol, or LDL, is less than 70 . This is why it is important to continue taking your atorvastatin  Feel free to call me with any questions or concerns. I look forward to our next call!   Estelle Grumbles, PharmD, BCACP Clinical Pharmacist [facility] [phone]           Our next appointment is by telephone on 09/27/2022 at 2:30 pm  Please call the care guide team at [phone] if you need to cancel or reschedule your appointment.    Patient verbalizes understanding of instructions and care plan provided today and agrees to view in MyChart. Active MyChart status and patient understanding of how to access instructions and care plan via MyChart confirmed with patient.

## 2022-07-06 DIAGNOSIS — G4733 Obstructive sleep apnea (adult) (pediatric): Secondary | ICD-10-CM | POA: Diagnosis not present

## 2022-07-10 ENCOUNTER — Other Ambulatory Visit: Payer: Self-pay | Admitting: Family Medicine

## 2022-07-10 DIAGNOSIS — I25118 Atherosclerotic heart disease of native coronary artery with other forms of angina pectoris: Secondary | ICD-10-CM

## 2022-07-10 DIAGNOSIS — J3089 Other allergic rhinitis: Secondary | ICD-10-CM

## 2022-07-10 DIAGNOSIS — F419 Anxiety disorder, unspecified: Secondary | ICD-10-CM

## 2022-07-11 DIAGNOSIS — I110 Hypertensive heart disease with heart failure: Secondary | ICD-10-CM

## 2022-07-11 DIAGNOSIS — I5020 Unspecified systolic (congestive) heart failure: Secondary | ICD-10-CM

## 2022-07-11 DIAGNOSIS — G4733 Obstructive sleep apnea (adult) (pediatric): Secondary | ICD-10-CM | POA: Diagnosis not present

## 2022-07-12 NOTE — Telephone Encounter (Signed)
[facility] called and spoke to [place], RPT about the refill(s) montelukast 10mg , Clopidogrel 75mg , Sertraline 100mg  requested. Advised it was sent on 06/23/22 #90/3 refill(s) and #180/3 refills for Sertraline. Alinda Money stated that do have the Rxs and will fill them as ordered.

## 2022-07-12 NOTE — Telephone Encounter (Signed)
Requested Prescriptions  Pending Prescriptions Disp Refills   montelukast (SINGULAIR) 10 MG tablet [Pharmacy Med Name: MONTELUKAST 10MG  TABLETS] 90 tablet 3    Sig: TAKE 1 TABLET(10 MG) BY MOUTH AT BEDTIME     Pulmonology:  Leukotriene Inhibitors Passed - 07/10/2022  3:13 AM      Passed - Valid encounter within last 12 months    Recent Outpatient Visits           2 weeks ago Essential hypertension   [place] [facility] [place], Netta [name], DO   6 months ago Annual physical exam   [facility] [place], Netta [name], DO   7 months ago OSA on CPAP   [place] [facility] [name], DO   9 months ago Essential hypertension   [place] [facility] Delles, Jackelyn Poling, RPH-CPP   1 year ago Pre-diabetes   [place] [facility] [place], Netta [name], DO               clopidogrel (PLAVIX) 75 MG tablet [Pharmacy Med Name: CLOPIDOGREL 75MG  TABLETS] 90 tablet 3    Sig: TAKE 1 TABLET(75 MG) BY MOUTH DAILY     Hematology: Antiplatelets - clopidogrel Failed - 07/10/2022  3:13 AM      Failed - HCT in normal range and within 180 days    HCT  Date Value Ref Range Status  12/22/2021 39.4 38.5 - 50.0 % Final         Failed - HGB in normal range and within 180 days    Hemoglobin  Date Value Ref Range Status  12/22/2021 13.2 13.2 - 17.1 g/dL Final         Failed - PLT in normal range and within 180 days    Platelets  Date Value Ref Range Status  12/22/2021 227 140 - 400 Thousand/uL Final         Passed - Cr in normal range and within 360 days    Creat  Date Value Ref Range Status  12/22/2021 0.95 0.70 - 1.22 mg/dL Final         Passed - Valid encounter within last 6 months    Recent Outpatient Visits           2 weeks ago Essential hypertension   [place] [facility] [name], DO   6 months ago Annual physical exam    [place] [facility] [place], Netta [name], DO   7 months ago OSA on CPAP   [place] [facility] [name], DO   9 months ago Essential hypertension   [place] [facility] Delles, Jackelyn Poling, RPH-CPP   1 year ago Pre-diabetes   [place] [facility] [place], Netta [name], DO               sertraline (ZOLOFT) 100 MG tablet [Pharmacy Med Name: SERTRALINE 100MG  TABLETS] 180 tablet 3    Sig: TAKE 2 TABLETS(200 MG) BY MOUTH DAILY     Psychiatry:  Antidepressants - SSRI - sertraline Passed - 07/10/2022  3:13 AM      Passed - AST in normal range and within 360 days    AST  Date Value Ref Range Status  12/22/2021 15 10 - 35 U/L Final         Passed - ALT in normal range and within 360 days  ALT  Date Value Ref Range Status  12/22/2021 12 9 - 46 U/L Final         Passed - Completed PHQ-2 or PHQ-9 in the last 360 days      Passed - Valid encounter within last 6 months    Recent Outpatient Visits           2 weeks ago Essential hypertension   [place] [facility] [place], Netta [name], DO   6 months ago Annual physical exam   [place] [facility] [name], DO   7 months ago OSA on CPAP   [place] [facility] [name], DO   9 months ago Essential hypertension   [place] [facility] Delles, Jackelyn Poling, RPH-CPP   1 year ago Pre-diabetes    [facility] [place], Netta [name], [place]

## 2022-07-14 ENCOUNTER — Other Ambulatory Visit: Payer: Self-pay | Admitting: Family Medicine

## 2022-07-14 DIAGNOSIS — B351 Tinea unguium: Secondary | ICD-10-CM | POA: Diagnosis not present

## 2022-07-14 DIAGNOSIS — M79675 Pain in left toe(s): Secondary | ICD-10-CM | POA: Diagnosis not present

## 2022-07-14 DIAGNOSIS — M79674 Pain in right toe(s): Secondary | ICD-10-CM | POA: Diagnosis not present

## 2022-07-14 DIAGNOSIS — I25810 Atherosclerosis of coronary artery bypass graft(s) without angina pectoris: Secondary | ICD-10-CM

## 2022-07-14 DIAGNOSIS — R234 Changes in skin texture: Secondary | ICD-10-CM | POA: Diagnosis not present

## 2022-07-14 DIAGNOSIS — B353 Tinea pedis: Secondary | ICD-10-CM | POA: Diagnosis not present

## 2022-07-14 NOTE — Telephone Encounter (Signed)
Medication is no longer on current medication list Requested Prescriptions  Pending Prescriptions Disp Refills   isosorbide mononitrate (IMDUR) 30 MG 24 hr tablet [Pharmacy Med Name: ISOSORBIDE MONONITRATE 30MG  ER TABS] 90 tablet 3    Sig: TAKE 1 TABLET(30 MG) BY MOUTH DAILY     Cardiovascular:  Nitrates Passed - 07/14/2022  6:17 AM      Passed - Last BP in normal range    BP Readings from Last 1 Encounters:  06/23/22 106/62         Passed - Last Heart Rate in normal range    Pulse Readings from Last 1 Encounters:  06/23/22 76         Passed - Valid encounter within last 12 months    Recent Outpatient Visits           3 weeks ago Essential hypertension   [facility] [place], Netta [name], DO   6 months ago Annual physical exam   [facility] [name], DO   7 months ago OSA on CPAP   [place] [facility] [name], DO   9 months ago Essential hypertension   [facility] [place], Jackelyn Poling, RPH-CPP   1 year ago Pre-diabetes   [facility] [place], Netta [name], [place]

## 2022-07-29 ENCOUNTER — Other Ambulatory Visit: Payer: Self-pay

## 2022-07-29 ENCOUNTER — Emergency Department: Payer: Medicare PPO

## 2022-07-29 ENCOUNTER — Ambulatory Visit: Payer: Self-pay | Admitting: *Deleted

## 2022-07-29 ENCOUNTER — Ambulatory Visit (INDEPENDENT_AMBULATORY_CARE_PROVIDER_SITE_OTHER): Payer: Medicare PPO

## 2022-07-29 ENCOUNTER — Emergency Department
Admission: EM | Admit: 2022-07-29 | Discharge: 2022-07-29 | Disposition: A | Discharge status: Home / Self Care | Payer: Medicare PPO | Attending: Emergency Medicine | Admitting: Emergency Medicine

## 2022-07-29 DIAGNOSIS — I10 Essential (primary) hypertension: Secondary | ICD-10-CM

## 2022-07-29 DIAGNOSIS — G9389 Other specified disorders of brain: Secondary | ICD-10-CM | POA: Diagnosis not present

## 2022-07-29 DIAGNOSIS — I6782 Cerebral ischemia: Secondary | ICD-10-CM | POA: Insufficient documentation

## 2022-07-29 DIAGNOSIS — R55 Syncope and collapse: Secondary | ICD-10-CM | POA: Diagnosis not present

## 2022-07-29 DIAGNOSIS — E861 Hypovolemia: Secondary | ICD-10-CM | POA: Diagnosis not present

## 2022-07-29 DIAGNOSIS — I5022 Chronic systolic (congestive) heart failure: Secondary | ICD-10-CM

## 2022-07-29 DIAGNOSIS — R42 Dizziness and giddiness: Secondary | ICD-10-CM | POA: Insufficient documentation

## 2022-07-29 DIAGNOSIS — E860 Dehydration: Secondary | ICD-10-CM | POA: Diagnosis not present

## 2022-07-29 DIAGNOSIS — R9431 Abnormal electrocardiogram [ECG] [EKG]: Secondary | ICD-10-CM | POA: Insufficient documentation

## 2022-07-29 LAB — CBC WITH DIFFERENTIAL/PLATELET
Abs Immature Granulocytes: 0.03 10*3/uL (ref 0.00–0.07)
Basophils Absolute: 0.1 10*3/uL (ref 0.0–0.1)
Basophils Relative: 1 %
Eosinophils Absolute: 0.2 10*3/uL (ref 0.0–0.5)
Eosinophils Relative: 2 %
HCT: 42.9 % (ref 39.0–52.0)
Hemoglobin: 14.1 g/dL (ref 13.0–17.0)
Immature Granulocytes: 0 %
Lymphocytes Relative: 20 %
Lymphs Abs: 1.7 10*3/uL (ref 0.7–4.0)
MCH: 30.9 pg (ref 26.0–34.0)
MCHC: 32.9 g/dL (ref 30.0–36.0)
MCV: 94.1 fL (ref 80.0–100.0)
Monocytes Absolute: 0.8 10*3/uL (ref 0.1–1.0)
Monocytes Relative: 9 %
Neutro Abs: 5.8 10*3/uL (ref 1.7–7.7)
Neutrophils Relative %: 68 %
Platelets: 230 10*3/uL (ref 150–400)
RBC: 4.56 MIL/uL (ref 4.22–5.81)
RDW: 13.9 % (ref 11.5–15.5)
WBC: 8.6 10*3/uL (ref 4.0–10.5)
nRBC: 0 % (ref 0.0–0.2)

## 2022-07-29 LAB — COMPREHENSIVE METABOLIC PANEL
ALT: 21 U/L (ref 0–44)
AST: 22 U/L (ref 15–41)
Albumin: 4 g/dL (ref 3.5–5.0)
Alkaline Phosphatase: 54 U/L (ref 38–126)
Anion gap: 8 (ref 5–15)
BUN: 20 mg/dL (ref 8–23)
CO2: 27 mmol/L (ref 22–32)
Calcium: 8.8 mg/dL — ABNORMAL LOW (ref 8.9–10.3)
Chloride: 100 mmol/L (ref 98–111)
Creatinine, Ser: 1.07 mg/dL (ref 0.61–1.24)
GFR, Estimated: >60 mL/min (ref >60)
Glucose, Bld: 115 mg/dL — ABNORMAL HIGH (ref 70–99)
Potassium: 3.8 mmol/L (ref 3.5–5.1)
Sodium: 135 mmol/L (ref 135–145)
Total Bilirubin: 0.6 mg/dL (ref 0.3–1.2)
Total Protein: 7.9 g/dL (ref 6.5–8.1)

## 2022-07-29 LAB — TROPONIN I (HIGH SENSITIVITY): Troponin I (High Sensitivity): 4 ng/L (ref <18)

## 2022-07-29 LAB — BRAIN NATRIURETIC PEPTIDE: B Natriuretic Peptide: 71.6 pg/mL (ref 0.0–100.0)

## 2022-07-29 MED ORDER — MECLIZINE HCL 12.5 MG PO TABS: 12.5000 mg | ORAL_TABLET | Freq: Three times a day (TID) | ORAL | 0 refills | Status: DC | PRN

## 2022-07-29 MED ORDER — SODIUM CHLORIDE 0.9 % IV BOLUS
1000.0000 mL | Freq: Once | INTRAVENOUS | Status: AC
  Administered 2022-07-29: 1000 mL via INTRAVENOUS

## 2022-07-29 MED ORDER — MECLIZINE HCL 25 MG PO TABS
12.5000 mg | ORAL_TABLET | Freq: Once | ORAL | Status: AC
  Administered 2022-07-29: 12.5 mg via ORAL
  Filled 2022-07-29: qty 1

## 2022-07-29 NOTE — Chronic Care Management (AMB) (Signed)
Chronic Care Management   CCM RN Visit Note  07/29/2022 Name: Luke Mckee MRN: [id number] DOB: February 10, 1940  Subjective: Luke Mckee is a 82 y.o. year old male who is a primary care patient of [name], DO. The patient was referred to the Chronic Care Management team for assistance with care management needs subsequent to provider initiation of CCM services and plan of care.    Today's Visit:  Engaged with patient by telephone also spoke to the patients wife, after a VM asking for help with getting and appointment today. The patient advised due to sx and sx to go to the ER for evaluation for follow up visit.     SDOH Interventions Today    Flowsheet Row Most Recent Value  SDOH Interventions   Health Literacy Interventions Intervention Not Indicated         Goals Addressed             This Visit's Progress    CCM Expected Outcome:  Monitor, Self-Manage and Reduce Symptoms of Heart Failure       Current Barriers:  Knowledge Deficits related to effective management of HF Chronic Disease Management support and education needs related to effective management of HF Wt Readings from Last 3 Encounters:  06/23/22 217 lb (98.4 kg)  03/05/22 200 lb (90.7 kg)  12/29/21 229 lb (103.9 kg)     Planned Interventions: Basic overview and discussion of pathophysiology of Heart Failure reviewed. The patient has been steadily losing weight by the use of a product called "Lean Belly". The patient denies any issues with swelling or edema. The patient states he is doing well and denies any new concerns with heart failure or heart health. Review and patient continues to do well with management of his heart failure.  Provided education on low sodium diet. The patient is compliant with the heart healthy diet.  Reviewed Heart Failure Action Plan in depth. Knows how to effectively manage his heart failure. Assessed need for readable accurate scales in home. Has a scale. Provided  education about placing scale on hard, flat surface Advised patient to weigh each morning after emptying bladder Discussed importance of daily weight and advised patient to weigh and record daily Reviewed role of diuretics in prevention of fluid overload and management of heart failure Discussed the importance of keeping all appointments with provider. Sees providers on a regular basis. Knows to call for changes or new needs. Education and support provided.  Provided patient with education about the role of exercise in the management of heart failure Advised patient to discuss changes in fluid balance or new concerns or questions related to heart failure with provider. The patient states he is stable.  The patient has had a new fall since last outreach. The patient fell down about 7 steps at home. The patient had hurt his foot back in March and it is still hurt and may even be worse since the fall. Will talk to the pcp for recommendations. The patient denies any acute distress today. Review of falls and safety concerns. The patient has a history of falls. The patient states he tries to be safe.  The patient is interested in a life alert system for him and his wife. Information provided and gave additional resources for life alert system in the event that his insurance does not offer options.  Incoming call from the patients wife, with VM. Return call made to the patients wife. The wife had also talked to the nurse triage  line.  The patient is not feeling well, is dizzy and lethargic, not his normal self. The patients wife took his blood pressures last night and it was 115/71 with pulse of 71. The pcp is unable to see the patient in the office today and advised the RNCM to instruct the patients wife to be evaluated at the ER. Will continue to monitor for changes and new needs.   Symptom Management: Take medications as prescribed   Attend all scheduled provider appointments Call pharmacy for medication  refills 3-7 days in advance of running out of medications Perform all self care activities independently  Perform IADL's (shopping, preparing meals, housekeeping, managing finances) independently Call provider office for new concerns or questions  call the Suicide and Crisis Lifeline: 988 call the [place] National Suicide Prevention Lifeline: (786)733-7093 or TTY: (216)766-1021 TTY 205-049-2474) to talk to a trained counselor call 1-800-273-TALK (toll free, 24 hour hotline) if experiencing a Mental Health or Behavioral Health Crisis   Follow Up Plan: Telephone follow up appointment with care management team member scheduled for: 08-02-2022 at 145 pm       CCM Expected Outcome:  Monitor, Self-Manage, and Reduce Symptoms of Hypertension       Current Barriers:  Knowledge Deficits related to effective management of HTN Chronic Disease Management support and education needs related to HTN and heart health BP Readings from Last 3 Encounters:  06/23/22 106/62  12/29/21 128/66  09/25/21 120/80  Self reported: 115/71 07-28-2022   Planned Interventions: Evaluation of current treatment plan related to hypertension self management and patient's adherence to plan as established by provider. The patient is stable and denies any issues with HTN or heart health. The patient is losing weight. The patient is stable at this time.;   Provided education to patient re: stroke prevention, s/s of heart attack and stroke; Reviewed prescribed diet Heart Healthy. The patient is compliant with heart healthy diet. The patient is losing weight. He and his wife are taking a supplement called Lean Belly. He is at 217. Feels great and is also going to the [facility] and doing the silver sneakers program. Praised the patient for positive changes.  Reviewed medications with patient and discussed importance of compliance. The patient is compliant with medications. The patient and his wife work with the pharm D on a regular basis.  Denies any acute findings today.  Discussed plans with patient for ongoing care management follow up and provided patient with direct contact information for care management team; Advised patient, providing education and rationale, to monitor blood pressure daily and record, calling PCP for findings outside established parameters. Blood pressures are stable. Denies any acute findings related to blood pressures or heart health;  Reviewed scheduled/upcoming provider appointments including: Saw pcp on 06-23-2022 for gout flare up and fluid in ear. He has consults in place.  Advised patient to discuss changes in HTN or heart health with provider; Provided education on prescribed diet heart healthy. Review and education provided;  Discussed complications of poorly controlled blood pressure such as heart disease, stroke, circulatory complications, vision complications, kidney impairment, sexual dysfunction;  Screening for signs and symptoms of depression related to chronic disease state;  Assessed social determinant of health barriers;  The patient asked about referral to the podiatrist due to some issues he is having with his foot that he had surgery on. Education provided about local podiatrist and number provided. Information provided and questions answered. The patient also can go to the [place]. The patient is still  having issues with his feet and has had a new fall in March. The patient and the patients wife ask if the pcp thinks he should see a podiatrist or an orthopedic provider. Will collaborate with the pcp and ask for his recommendations. Will follow up accordingly with the patient and his wife after receiving recommendations from the pcp.  The patients family is helping more with the patient needs. Their granddaughter will be going with them to all of their upcoming appointments.  Extensive review of orthostatic hypotension since the patient is having some dizziness. May be dehydrated as well. Spoke  directly to the patient and his wife, he has agreed to go to the ER to be checked out.   Symptom Management: Take medications as prescribed   Attend all scheduled provider appointments Call pharmacy for medication refills 3-7 days in advance of running out of medications Call provider office for new concerns or questions  call the Suicide and Crisis Lifeline: 988 call the [place] National Suicide Prevention Lifeline: (330)192-7605 or TTY: 579-522-8392 TTY 660-607-5923) to talk to a trained counselor call 1-800-273-TALK (toll free, 24 hour hotline) if experiencing a Mental Health or Behavioral Health Crisis  check blood pressure weekly learn about high blood pressure call doctor for signs and symptoms of high blood pressure develop an action plan for high blood pressure keep all doctor appointments take medications for blood pressure exactly as prescribed report new symptoms to your doctor  Follow Up Plan: Telephone follow up appointment with care management team member scheduled for: 08-02-2022 at 145 pm          Plan:Telephone follow up appointment with care management team member scheduled for:  08-02-2022 at 145 pm  Alto Denver RN, MSN, CCM RN Care Manager  Chronic Care Management Direct Number: [phone]

## 2022-07-29 NOTE — ED Provider Notes (Signed)
[facility] Provider Note    Event Date/Time   First MD Initiated Contact with Patient 07/29/22 1547     (approximate)   History   Near Syncope   HPI  Luke Mckee is a 82 y.o. male  here with dizziness, near syncope. Pt reports that he has been having increasingly frequent spells of feeling very dizzy, lightheaded, and that his vision is "going side to side.' Has never passed out with them. No CP, SOB. Sx seem to be increasingly frequent. No HA, changes in vision, n/v, palpitations. But when he moves quickly, esp lying down to sitting up, he gets very dizzy. Supposed to see an ENT at the [facility] soon for this. No tinnitus.       Physical Exam   Triage Vital Signs: ED Triage Vitals  Encounter Vitals Group     BP 07/29/22 1405 (!) 155/114     Systolic BP Percentile --      Diastolic BP Percentile --      Pulse Rate 07/29/22 1405 65     Resp 07/29/22 1405 18     Temp 07/29/22 1405 97.8 F (36.6 C)     Temp Source 07/29/22 1405 Oral     SpO2 07/29/22 1405 95 %     Weight 07/29/22 1406 216 lb 14.9 oz (98.4 kg)     Height 07/29/22 1406 5\' 8"  (1.727 m)     Head Circumference --      Peak Flow --      Pain Score 07/29/22 1405 0     Pain Loc --      Pain Education --      Exclude from Growth Chart --     Most recent vital signs: Vitals:   07/29/22 2100 07/29/22 2236  BP: 128/69 125/78  Pulse: 64 62  Resp: (!) 21 (!) 21  Temp:  97.9 F (36.6 C)  SpO2:  95%     General: Awake, no distress.  CV:  Good peripheral perfusion. RRR. Resp:  Normal work of breathing. Lungs clear. Abd:  No distention. No tenderness. Other:  Normal TMs bilaterally. CNII-XII intact. Strength 5/5 bilateral UE and LE. Normal sensation to light touch. No nystagmus. Normal FTN and HTS. Gait normal.   ED Results / Procedures / Treatments   Labs (all labs ordered are listed, but only abnormal results are displayed) Labs Reviewed  COMPREHENSIVE METABOLIC PANEL - Abnormal;  Notable for the following components:      Result Value   Glucose, Bld 115 (*)    Calcium 8.8 (*)    All other components within normal limits  CBC WITH DIFFERENTIAL/PLATELET  BRAIN NATRIURETIC PEPTIDE  TROPONIN I (HIGH SENSITIVITY)     EKG Normal sinus rhythm, trickle rate 66.  PR 172, QRS 70, QTc 417 no acute ST elevations or depressions.  No EKG evidence s of acute ischemic infarct.   RADIOLOGY CT head: No acute intracranial normality MR brain: No acute abnormality   I also independently reviewed and agree with radiologist interpretations.   PROCEDURES:  Critical Care performed: No   MEDICATIONS ORDERED IN ED: Medications  sodium chloride 0.9 % bolus 1,000 mL (0 mLs Intravenous Stopped 07/29/22 2129)  meclizine (ANTIVERT) tablet 12.5 mg (12.5 mg Oral Given 07/29/22 2159)     IMPRESSION / MDM / ASSESSMENT AND PLAN / ED COURSE  I reviewed the triage vital signs and the nursing notes.  Differential diagnosis includes, but is not limited to, dehydration, overmedication with relative hypotension, vertigo, likely peripheral, central vertigo, CVA, anemia  [name] presentation is most consistent with acute presentation with potential threat to life or bodily function.  The patient is on the cardiac monitor to evaluate for evidence of arrhythmia and/or significant heart rate changes   Pleasant 82 year old male here with dizziness upon position changes.  Clinically, patient appears mildly dehydrated and reportedly has had slight decrease in his blood pressure after his wife, who recent a stroke, has been managing his medications and I suspect he may be overmedicated.  He is on a relatively high dose of atenolol which we will cut in half.  Lab work is very reassuring.  CBC shows no leukocytosis or anemia.  CMP is unremarkable with normal renal function.  Troponin and BNP are normal.  EKG nonischemic.  He has no arrhythmia.  He does appear mildly  bradycardic although this is baseline.  As mentioned, will cut his atenolol in half.  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  CT head shows no acute normality.  Given his age, MRI was obtained which again shows no evidence of acute abnormality.  He is tolerating p.o. and feels better after low-dose meclizine.  Will give meclizine as needed peripheral vertigo, refer to ENT, and have him half his atenolol and make sure that his medications are accurate.   FINAL CLINICAL IMPRESSION(S) / ED DIAGNOSES   Final diagnoses:  Dizziness  Dehydration     Rx / DC Orders   ED Discharge Orders          Ordered    meclizine (ANTIVERT) 12.5 MG tablet  3 times daily PRN        07/29/22 2248             Note:  This document was prepared using Dragon voice recognition software and may include unintentional dictation errors.   Shaune Pollack, MD 07/29/22 [phone]

## 2022-07-29 NOTE — Discharge Instructions (Signed)
For the next 1-2 days:  HALF YOUR ATENOLOL DOSE - FROM 50 MG TO 25 MG CHECK YOUR BLOOD PRESSURE 2-3X DAILY CONTINUE YOUR OTHER MEDICATIONS  MAKE SURE YOU ARE TAKING THE APPROPRIATE MEDICATIONS DAILY  FOR DIZZINESS, YOU CAN TAKE THE MECLIZINE 12.5 MG 2-3X DAILY FOLLOW-UP WITH YOUR PRIMARY DOCTOR IN 2-3 DAYS IDEALLY YOU MAY NEED REFERRAL TO AN ENT DOCTOR IF DIZZINESS RETURNS

## 2022-07-29 NOTE — ED Triage Notes (Signed)
Pt comes in via pov with a family member. According to family Pt has been having dizzy spells the past 4 days. When pt stands he gets very dizzy. Pt has a history of hypertension an afib. Family member isn't sure if pt has been taking the correct dosage. Pt is alert and oriented x4 with no complaints of pain at this time.

## 2022-07-29 NOTE — Patient Instructions (Signed)
Please call the care guide team at [phone] if you need to cancel or reschedule your appointment.   If you are experiencing a Mental Health or Behavioral Health Crisis or need someone to talk to, please call the Suicide and Crisis Lifeline: 988 call the [place] National Suicide Prevention Lifeline: 661-209-7002 or TTY: (252) 380-7135 TTY 308-531-9502) to talk to a trained counselor call 1-800-273-TALK (toll free, 24 hour hotline)   Following is a copy of the CCM Program Consent:  CCM service includes personalized support from designated clinical staff supervised by the physician, including individualized plan of care and coordination with other care providers 24/7 contact phone numbers for assistance for urgent and routine care needs. Service will only be billed when office clinical staff spend 20 minutes or more in a month to coordinate care. Only one practitioner may furnish and bill the service in a calendar month. The patient may stop CCM services at amy time (effective at the end of the month) by phone call to the office staff. The patient will be responsible for cost sharing (co-pay) or up to 20% of the service fee (after annual deductible is met)  Following is a copy of your full provider care plan:   Goals Addressed             This Visit's Progress    CCM Expected Outcome:  Monitor, Self-Manage and Reduce Symptoms of Heart Failure       Current Barriers:  Knowledge Deficits related to effective management of HF Chronic Disease Management support and education needs related to effective management of HF Wt Readings from Last 3 Encounters:  06/23/22 217 lb (98.4 kg)  03/05/22 200 lb (90.7 kg)  12/29/21 229 lb (103.9 kg)     Planned Interventions: Basic overview and discussion of pathophysiology of Heart Failure reviewed. The patient has been steadily losing weight by the use of a product called "Lean Belly". The patient denies any issues with swelling or edema. The patient  states he is doing well and denies any new concerns with heart failure or heart health. Review and patient continues to do well with management of his heart failure.  Provided education on low sodium diet. The patient is compliant with the heart healthy diet.  Reviewed Heart Failure Action Plan in depth. Knows how to effectively manage his heart failure. Assessed need for readable accurate scales in home. Has a scale. Provided education about placing scale on hard, flat surface Advised patient to weigh each morning after emptying bladder Discussed importance of daily weight and advised patient to weigh and record daily Reviewed role of diuretics in prevention of fluid overload and management of heart failure Discussed the importance of keeping all appointments with provider. Sees providers on a regular basis. Knows to call for changes or new needs. Education and support provided.  Provided patient with education about the role of exercise in the management of heart failure Advised patient to discuss changes in fluid balance or new concerns or questions related to heart failure with provider. The patient states he is stable.  The patient has had a new fall since last outreach. The patient fell down about 7 steps at home. The patient had hurt his foot back in March and it is still hurt and may even be worse since the fall. Will talk to the pcp for recommendations. The patient denies any acute distress today. Review of falls and safety concerns. The patient has a history of falls. The patient states he tries to be  safe.  The patient is interested in a life alert system for him and his wife. Information provided and gave additional resources for life alert system in the event that his insurance does not offer options.  Incoming call from the patients wife, with VM. Return call made to the patients wife. The wife had also talked to the nurse triage line.  The patient is not feeling well, is dizzy and  lethargic, not his normal self. The patients wife took his blood pressures last night and it was 115/71 with pulse of 71. The pcp is unable to see the patient in the office today and advised the RNCM to instruct the patients wife to be evaluated at the ER. Will continue to monitor for changes and new needs.   Symptom Management: Take medications as prescribed   Attend all scheduled provider appointments Call pharmacy for medication refills 3-7 days in advance of running out of medications Perform all self care activities independently  Perform IADL's (shopping, preparing meals, housekeeping, managing finances) independently Call provider office for new concerns or questions  call the Suicide and Crisis Lifeline: 988 call the [place] National Suicide Prevention Lifeline: 972-011-9632 or TTY: 662 596 3963 TTY 435-166-9182) to talk to a trained counselor call 1-800-273-TALK (toll free, 24 hour hotline) if experiencing a Mental Health or Behavioral Health Crisis   Follow Up Plan: Telephone follow up appointment with care management team member scheduled for: 08-02-2022 at 145 pm       CCM Expected Outcome:  Monitor, Self-Manage, and Reduce Symptoms of Hypertension       Current Barriers:  Knowledge Deficits related to effective management of HTN Chronic Disease Management support and education needs related to HTN and heart health BP Readings from Last 3 Encounters:  06/23/22 106/62  12/29/21 128/66  09/25/21 120/80  Self reported: 115/71 07-28-2022   Planned Interventions: Evaluation of current treatment plan related to hypertension self management and patient's adherence to plan as established by provider. The patient is stable and denies any issues with HTN or heart health. The patient is losing weight. The patient is stable at this time.;   Provided education to patient re: stroke prevention, s/s of heart attack and stroke; Reviewed prescribed diet Heart Healthy. The patient is compliant  with heart healthy diet. The patient is losing weight. He and his wife are taking a supplement called Lean Belly. He is at 217. Feels great and is also going to the [facility] and doing the silver sneakers program. Praised the patient for positive changes.  Reviewed medications with patient and discussed importance of compliance. The patient is compliant with medications. The patient and his wife work with the pharm D on a regular basis. Denies any acute findings today.  Discussed plans with patient for ongoing care management follow up and provided patient with direct contact information for care management team; Advised patient, providing education and rationale, to monitor blood pressure daily and record, calling PCP for findings outside established parameters. Blood pressures are stable. Denies any acute findings related to blood pressures or heart health;  Reviewed scheduled/upcoming provider appointments including: Saw pcp on 06-23-2022 for gout flare up and fluid in ear. He has consults in place.  Advised patient to discuss changes in HTN or heart health with provider; Provided education on prescribed diet heart healthy. Review and education provided;  Discussed complications of poorly controlled blood pressure such as heart disease, stroke, circulatory complications, vision complications, kidney impairment, sexual dysfunction;  Screening for signs and symptoms of depression  related to chronic disease state;  Assessed social determinant of health barriers;  The patient asked about referral to the podiatrist due to some issues he is having with his foot that he had surgery on. Education provided about local podiatrist and number provided. Information provided and questions answered. The patient also can go to the [place]. The patient is still having issues with his feet and has had a new fall in March. The patient and the patients wife ask if the pcp thinks he should see a podiatrist or an orthopedic provider.  Will collaborate with the pcp and ask for his recommendations. Will follow up accordingly with the patient and his wife after receiving recommendations from the pcp.  The patients family is helping more with the patient needs. Their granddaughter will be going with them to all of their upcoming appointments.  Extensive review of orthostatic hypotension since the patient is having some dizziness. May be dehydrated as well. Spoke directly to the patient and his wife, he has agreed to go to the ER to be checked out.   Symptom Management: Take medications as prescribed   Attend all scheduled provider appointments Call pharmacy for medication refills 3-7 days in advance of running out of medications Call provider office for new concerns or questions  call the Suicide and Crisis Lifeline: 988 call the [place] National Suicide Prevention Lifeline: (548) 741-0987 or TTY: (360)577-1180 TTY 229-451-9112) to talk to a trained counselor call 1-800-273-TALK (toll free, 24 hour hotline) if experiencing a Mental Health or Behavioral Health Crisis  check blood pressure weekly learn about high blood pressure call doctor for signs and symptoms of high blood pressure develop an action plan for high blood pressure keep all doctor appointments take medications for blood pressure exactly as prescribed report new symptoms to your doctor  Follow Up Plan: Telephone follow up appointment with care management team member scheduled for: 08-02-2022 at 145 pm          Patient verbalizes understanding of instructions and care plan provided today and agrees to view in MyChart. Active MyChart status and patient understanding of how to access instructions and care plan via MyChart confirmed with patient.  Telephone follow up appointment with care management team member scheduled for: 08-02-2022 at 145 pm

## 2022-07-29 NOTE — ED Notes (Signed)
Patient transported to MRI 

## 2022-07-29 NOTE — Telephone Encounter (Signed)
  Chief Complaint: dizziness sleeping a lot per patient's wife , on DPR  Symptoms: patient asleep now. Wife reports patient with worsening dizziness, lightheaded, weak standing and holds to furniture to walk. Last BP check 115/71.  Frequency: greater than 1 week ago  Pertinent Negatives: Patient denies chest pain no difficulty breathing per wife report. No N/V.  Disposition: [x] ED /[] Urgent Care (no appt availability in office) / [] Appointment(In office/virtual)/ []  [place] [facility]/ [] Home Care/ [] Refused Recommended Disposition /[] [place] [facility]/ []  Follow-up with PCP Additional Notes:   Recommended patient's wife to take patient to ED now. If patient unable to walk call 911 for transport. [name] wife reports it may be a while before she take him bc he is sleeping . Please advise.     Reason for Disposition  SEVERE dizziness (e.g., unable to stand, requires support to walk, feels like passing out now)  Answer Assessment - Initial Assessment Questions 1. DESCRIPTION: "Describe your dizziness."     lightheaded 2. LIGHTHEADED: "Do you feel lightheaded?" (e.g., somewhat faint, woozy, weak upon standing)     Woozy weak with standing per patient's wife report 3. VERTIGO: "Do you feel like either you or the room is spinning or tilting?" (i.e. vertigo)     Room spinning  4. SEVERITY: "How bad is it?"  "Do you feel like you are going to faint?" "Can you stand and walk?"   - MILD: Feels slightly dizzy, but walking normally.   - MODERATE: Feels unsteady when walking, but not falling; interferes with normal activities (e.g., school, work).   - SEVERE: Unable to walk without falling, or requires assistance to walk without falling; feels like passing out now.      Unable to walk without holding on to furniture  5. ONSET:  "When did the dizziness begin?"     Last week and getting worse 6. AGGRAVATING FACTORS: "Does anything make it worse?" (e.g., standing, change in head position)      na 7. HEART RATE: "Can you tell me your heart rate?" "How many beats in 15 seconds?"  (Note: not all patients can do this)       Na  8. CAUSE: "What do you think is causing the dizziness?"     Not sure  9. RECURRENT SYMPTOM: "Have you had dizziness before?" If Yes, ask: "When was the last time?" "What happened that time?"     Na  10. OTHER SYMPTOMS: "Do you have any other symptoms?" (e.g., fever, chest pain, vomiting, diarrhea, bleeding)       Lightheaded, room spinning , asleep now. BP 115/71. Asleep now  11. PREGNANCY: "Is there any chance you are pregnant?" "When was your last menstrual period?"       na  Protocols used: Dizziness - Lightheadedness-A-AH

## 2022-07-30 ENCOUNTER — Ambulatory Visit: Payer: Self-pay

## 2022-07-30 DIAGNOSIS — I10 Essential (primary) hypertension: Secondary | ICD-10-CM

## 2022-07-30 DIAGNOSIS — I5022 Chronic systolic (congestive) heart failure: Secondary | ICD-10-CM

## 2022-07-30 DIAGNOSIS — R42 Dizziness and giddiness: Secondary | ICD-10-CM

## 2022-07-30 NOTE — Transitions of Care (Post Inpatient/ED Visit) (Cosign Needed)
07/30/2022  Name: Luke Mckee MRN: [id number] DOB: Oct 30, 1940  Today's TOC FU Call Status: Today's TOC FU Call Status:: Successful TOC FU Call Competed TOC FU Call Complete Date: 07/30/22  Transition Care Management Follow-up Telephone Call Date of Discharge: 07/29/22 Discharge Facility: [facility]) Type of Discharge: Emergency Department Reason for ED Visit: Other: (near syncope and dehydration) How have you been since you were released from the hospital?: Better Any questions or concerns?: No  Items Reviewed: Did you receive and understand the discharge instructions provided?: Yes Medications obtained,verified, and reconciled?: Yes (Medications Reviewed) Any new allergies since your discharge?: No Dietary orders reviewed?: NA Do you have support at home?: Yes People in Home: spouse Name of Support/Comfort Primary Source: [name], his wife, also adult children and their partners  Medications Reviewed Today: Medications Reviewed Today     Reviewed by [name], RN (Case Manager) on 07/30/22 at 1555  Med List Status: <None>   Medication Order Taking? Sig Documenting Provider Last Dose Status Informant  acetaminophen (TYLENOL) 650 MG CR tablet [id number] No Take 1,300 mg by mouth at bedtime as needed. [provider] Taking Active Spouse/Significant Other  albuterol (VENTOLIN HFA) 108 (90 Base) MCG/ACT inhaler [id number] No Inhale 1-2 puffs into the lungs every 4 (four) hours as needed for wheezing or shortness of breath (cough). [name], DO Taking Active   aspirin EC 81 MG tablet [id number] No Take 81 mg by mouth daily. [provider] Taking Active   atenolol (TENORMIN) 50 MG tablet [id number] No Take 1 tablet (50 mg total) by mouth daily. [name], DO Taking Active   atorvastatin (LIPITOR) 80 MG tablet [id number]  Take 1 tablet (80 mg total) by mouth daily. [name], DO  Active   Calcium  Carb-Cholecalciferol (CALCIUM 600 + D PO) [id number] No Take 1 tablet by mouth daily. [provider] Taking Active   Cholecalciferol (VITAMIN D) 50 MCG (2000 UT) tablet [id number] No Take 2,000 Units by mouth daily. [provider] Taking Active Spouse/Significant Other  clopidogrel (PLAVIX) 75 MG tablet [id number]  Take 1 tablet (75 mg total) by mouth daily. Karamalegos, Netta [name], DO  Active   clotrimazole-betamethasone (LOTRISONE) cream [id number] No Apply twice a day for 1-2 weeks, may repeat if need in future  Patient not taking: Reported on 06/23/2022   [name], DO Not Taking Active   co-enzyme Q-10 30 MG capsule [id number] No Take 100 mg by mouth daily. [provider] Taking Active   colchicine 0.6 MG tablet [id number]  For acute gout flare take 2 pills at once, then take a 3rd pill 2 hours later. Then take 1 pill daily for 7-10 days or until resolved. [name], DO  Active   diclofenac sodium (VOLTAREN) 1 % GEL [id number] No Apply 2 g topically 4 (four) times daily as needed for pain. Foot pain [provider] Taking Active Spouse/Significant Other  furosemide (LASIX) 80 MG tablet [id number] No TAKE 1 TABLET(80 MG) BY MOUTH TWICE DAILY Karamalegos, Netta [name], DO Taking Active   ipratropium (ATROVENT) 0.06 % nasal spray [id number] No Place 2 sprays into both nostrils 4 (four) times daily as needed for rhinitis. [name], DO Taking Active   levothyroxine (SYNTHROID) 125 MCG tablet [id number] No TAKE 1 TABLET(125 MCG) BY MOUTH DAILY BEFORE BREAKFAST Karamalegos, Netta [name], DO Taking Active   loratadine (CLARITIN) 10 MG tablet [id number] No Take 10 mg by mouth daily. [provider]  Taking Active   LORazepam (ATIVAN) 0.5 MG tablet [id number]  Take 1 tablet (0.5 mg total) by mouth daily as needed for anxiety or sleep. Karamalegos, Netta [name], DO  Active   meclizine (ANTIVERT) 12.5 MG tablet [id number]  Take 1  tablet (12.5 mg total) by mouth 3 (three) times daily as needed for dizziness. Shaune Pollack, MD  Active   montelukast (SINGULAIR) 10 MG tablet [id number] No Take 1 tablet (10 mg total) by mouth at bedtime. [name], DO Taking Active   Multiple Vitamin (MULTIVITAMIN) tablet [id number] No Take 1 tablet by mouth daily. [provider] Taking Active Spouse/Significant Other  nitroGLYCERIN (NITROSTAT) 0.4 MG SL tablet [id number] No Place 1 tablet (0.4 mg total) under the tongue every 5 (five) minutes as needed for chest pain. [name], DO Taking Active   PATADAY 0.1 % ophthalmic solution [id number] No Place into both eyes as needed.  [provider] Taking Active   sennosides-docusate sodium (SENOKOT-S) 8.6-50 MG tablet [id number] No Take 1-2 tablets by mouth daily as needed for constipation. [provider] Taking Active   sertraline (ZOLOFT) 100 MG tablet [id number]  Take 2 tablets (200 mg total) by mouth daily. [name], DO  Active   spironolactone (ALDACTONE) 25 MG tablet [id number] No Take 0.5 tablets (12.5 mg total) by mouth daily. [name], DO Taking Active   traZODone (DESYREL) 50 MG tablet [id number]  Take 1 tablet (50 mg total) by mouth at bedtime. [name], DO  Active             Home Care and Equipment/Supplies: Were Home Health Services Ordered?: No Any new equipment or medical supplies ordered?: No  Functional Questionnaire: Do you need assistance with bathing/showering or dressing?: No Do you need assistance with meal preparation?: No Do you need assistance with eating?: No Do you have difficulty maintaining continence: No Do you need assistance with getting out of bed/getting out of a chair/moving?: No Do you have difficulty managing or taking your medications?: No  Follow up appointments reviewed: PCP Follow-up appointment confirmed?: No MD Provider Line Number:[phone]  Given: Yes (message sent to the front office staff asking for an appointment for ER follow up in one week with the pcp post ER visit) Specialist Hospital Follow-up appointment confirmed?: No Reason Specialist Follow-Up Not Confirmed: Patient has Specialist Provider Number and will Call for Appointment Do you need transportation to your follow-up appointment?: No Do you understand care options if your condition(s) worsen?: Yes-patient verbalized understanding    Alto Denver RN, MSN, CCM RN Care Manager  Chronic Care Management Direct Number: [phone]

## 2022-07-30 NOTE — Patient Instructions (Signed)
Please call the care guide team at [phone] if you need to cancel or reschedule your appointment.   If you are experiencing a Mental Health or Behavioral Health Crisis or need someone to talk to, please call the Suicide and Crisis Lifeline: 988 call the [place] National Suicide Prevention Lifeline: 909-844-3585 or TTY: 6267477577 TTY 385-851-9293) to talk to a trained counselor call 1-800-273-TALK (toll free, 24 hour hotline)   Following is a copy of the CCM Program Consent:  CCM service includes personalized support from designated clinical staff supervised by the physician, including individualized plan of care and coordination with other care providers 24/7 contact phone numbers for assistance for urgent and routine care needs. Service will only be billed when office clinical staff spend 20 minutes or more in a month to coordinate care. Only one practitioner may furnish and bill the service in a calendar month. The patient may stop CCM services at amy time (effective at the end of the month) by phone call to the office staff. The patient will be responsible for cost sharing (co-pay) or up to 20% of the service fee (after annual deductible is met)  Following is a copy of your full provider care plan:  Today's TOC FU Call Status: Today's TOC FU Call Status:: Successful TOC FU Call Competed TOC FU Call Complete Date: 07/30/22  Transition Care Management Follow-up Telephone Call Date of Discharge: 07/29/22 Discharge Facility: [facility]) Type of Discharge: Emergency Department Reason for ED Visit: Other: (near syncope and dehydration) How have you been since you were released from the hospital?: Better Any questions or concerns?: No  Items Reviewed: Did you receive and understand the discharge instructions provided?: Yes Medications obtained,verified, and reconciled?: Yes (Medications Reviewed) Any new allergies since your discharge?: No Dietary orders  reviewed?: NA Do you have support at home?: Yes People in Home: spouse Name of Support/Comfort Primary Source: [name], his wife, also adult children and their partners  Medications Reviewed Today: Medications Reviewed Today     Reviewed by [name], RN (Case Manager) on 07/30/22 at 1555  Med List Status: <None>   Medication Order Taking? Sig Documenting Provider Last Dose Status Informant  acetaminophen (TYLENOL) 650 MG CR tablet [id number] No Take 1,300 mg by mouth at bedtime as needed. [provider] Taking Active Spouse/Significant Other  albuterol (VENTOLIN HFA) 108 (90 Base) MCG/ACT inhaler [id number] No Inhale 1-2 puffs into the lungs every 4 (four) hours as needed for wheezing or shortness of breath (cough). [name], DO Taking Active   aspirin EC 81 MG tablet [id number] No Take 81 mg by mouth daily. [provider] Taking Active   atenolol (TENORMIN) 50 MG tablet [id number] No Take 1 tablet (50 mg total) by mouth daily. [name], DO Taking Active   atorvastatin (LIPITOR) 80 MG tablet [id number]  Take 1 tablet (80 mg total) by mouth daily. [name], DO  Active   Calcium Carb-Cholecalciferol (CALCIUM 600 + D PO) [id number] No Take 1 tablet by mouth daily. [provider] Taking Active   Cholecalciferol (VITAMIN D) 50 MCG (2000 UT) tablet [id number] No Take 2,000 Units by mouth daily. [provider] Taking Active Spouse/Significant Other  clopidogrel (PLAVIX) 75 MG tablet [id number]  Take 1 tablet (75 mg total) by mouth daily. Karamalegos, Netta [name], DO  Active   clotrimazole-betamethasone (LOTRISONE) cream [id number] No Apply twice a day for 1-2 weeks, may repeat if need in future  Patient not taking: Reported on  06/23/2022   [name], DO Not Taking Active   co-enzyme Q-10 30 MG capsule [id number] No Take 100 mg by mouth daily. [provider] Taking Active   colchicine 0.6 MG  tablet [id number]  For acute gout flare take 2 pills at once, then take a 3rd pill 2 hours later. Then take 1 pill daily for 7-10 days or until resolved. [name], DO  Active   diclofenac sodium (VOLTAREN) 1 % GEL [id number] No Apply 2 g topically 4 (four) times daily as needed for pain. Foot pain [provider] Taking Active Spouse/Significant Other  furosemide (LASIX) 80 MG tablet [id number] No TAKE 1 TABLET(80 MG) BY MOUTH TWICE DAILY Karamalegos, Netta [name], DO Taking Active   ipratropium (ATROVENT) 0.06 % nasal spray [id number] No Place 2 sprays into both nostrils 4 (four) times daily as needed for rhinitis. [name], DO Taking Active   levothyroxine (SYNTHROID) 125 MCG tablet [id number] No TAKE 1 TABLET(125 MCG) BY MOUTH DAILY BEFORE BREAKFAST Karamalegos, Netta [name], DO Taking Active   loratadine (CLARITIN) 10 MG tablet [id number] No Take 10 mg by mouth daily. [provider] Taking Active   LORazepam (ATIVAN) 0.5 MG tablet [id number]  Take 1 tablet (0.5 mg total) by mouth daily as needed for anxiety or sleep. Karamalegos, Netta [name], DO  Active   meclizine (ANTIVERT) 12.5 MG tablet [id number]  Take 1 tablet (12.5 mg total) by mouth 3 (three) times daily as needed for dizziness. Shaune Pollack, MD  Active   montelukast (SINGULAIR) 10 MG tablet [id number] No Take 1 tablet (10 mg total) by mouth at bedtime. [name], DO Taking Active   Multiple Vitamin (MULTIVITAMIN) tablet [id number] No Take 1 tablet by mouth daily. [provider] Taking Active Spouse/Significant Other  nitroGLYCERIN (NITROSTAT) 0.4 MG SL tablet [id number] No Place 1 tablet (0.4 mg total) under the tongue every 5 (five) minutes as needed for chest pain. [name], DO Taking Active   PATADAY 0.1 % ophthalmic solution [id number] No Place into both eyes as needed.  [provider] Taking Active   sennosides-docusate sodium (SENOKOT-S) 8.6-50  MG tablet [id number] No Take 1-2 tablets by mouth daily as needed for constipation. [provider] Taking Active   sertraline (ZOLOFT) 100 MG tablet [id number]  Take 2 tablets (200 mg total) by mouth daily. [name], DO  Active   spironolactone (ALDACTONE) 25 MG tablet [id number] No Take 0.5 tablets (12.5 mg total) by mouth daily. [name], DO Taking Active   traZODone (DESYREL) 50 MG tablet [id number]  Take 1 tablet (50 mg total) by mouth at bedtime. [name], DO  Active             Home Care and Equipment/Supplies: Were Home Health Services Ordered?: No Any new equipment or medical supplies ordered?: No  Functional Questionnaire: Do you need assistance with bathing/showering or dressing?: No Do you need assistance with meal preparation?: No Do you need assistance with eating?: No Do you have difficulty maintaining continence: No Do you need assistance with getting out of bed/getting out of a chair/moving?: No Do you have difficulty managing or taking your medications?: No  Follow up appointments reviewed: PCP Follow-up appointment confirmed?: No MD Provider Line Number:[phone] Given: Yes (message sent to the front office staff asking for an appointment for ER follow up in one week with the pcp post ER visit) Specialist Hospital Follow-up appointment confirmed?: No Reason Specialist Follow-Up Not Confirmed: Patient has  Specialist Provider Number and will Call for Appointment Do you need transportation to your follow-up appointment?: No Do you understand care options if your condition(s) worsen?: Yes-patient verbalized understanding      Patient verbalizes understanding of instructions and care plan provided today and agrees to view in MyChart. Active MyChart status and patient understanding of how to access instructions and care plan via MyChart confirmed with patient.  Telephone follow up appointment with care  management team member scheduled for: 08-02-2022

## 2022-08-02 ENCOUNTER — Telehealth: Payer: Medicare PPO

## 2022-08-02 ENCOUNTER — Ambulatory Visit: Payer: Self-pay

## 2022-08-02 DIAGNOSIS — I10 Essential (primary) hypertension: Secondary | ICD-10-CM

## 2022-08-02 DIAGNOSIS — I5022 Chronic systolic (congestive) heart failure: Secondary | ICD-10-CM

## 2022-08-02 NOTE — Chronic Care Management (AMB) (Signed)
Chronic Care Management   CCM RN Visit Note  08/02/2022 Name: Luke Mckee MRN: [id number] DOB: Apr 08, 1940  Subjective: Luke Mckee is a 82 y.o. year old male who is a primary care patient of [name], DO. The patient was referred to the Chronic Care Management team for assistance with care management needs subsequent to provider initiation of CCM services and plan of care.    Today's Visit:  Engaged with patient by telephone for follow up visit.        Goals Addressed             This Visit's Progress    CCM Expected Outcome:  Monitor, Self-Manage and Reduce Symptoms of Heart Failure       Current Barriers:  Knowledge Deficits related to effective management of HF Chronic Disease Management support and education needs related to effective management of HF Wt Readings from Last 3 Encounters:  07/29/22 216 lb 14.9 oz (98.4 kg)  06/23/22 217 lb (98.4 kg)  03/05/22 200 lb (90.7 kg)  Self reported weight today is 213   Planned Interventions: Basic overview and discussion of pathophysiology of Heart Failure reviewed. The patient has been steadily losing weight by the use of a product called "Lean Belly". The patient denies any issues with swelling or edema. The patient states he is doing well and denies any new concerns with heart failure or heart health. Review and patient continues to do well with management of his heart failure.  Provided education on low sodium diet. The patient is compliant with the heart healthy diet.  Reviewed Heart Failure Action Plan in depth. Knows how to effectively manage his heart failure. Assessed need for readable accurate scales in home. Has a scale. Provided education about placing scale on hard, flat surface Advised patient to weigh each morning after emptying bladder Discussed importance of daily weight and advised patient to weigh and record daily. Weight today was 213. Reviewed role of diuretics in prevention of fluid overload and  management of heart failure. Is compliant with medications. Ask the patients wife to discuss maybe lowering his does of Lasix since the patient has lost weight and is not having the edema as he was. He is on 160 mg daily. Discussed the importance of keeping all appointments with provider. Appointment with the cardiologist for 08-05-2022   Provided patient with education about the role of exercise in the management of heart failure Advised patient to discuss changes in fluid balance or new concerns or questions related to heart failure with provider. The patient states he is stable.  The patient is interested in a life alert system for him and his wife. Information provided and gave additional resources for life alert system in the event that his insurance does not offer options.  The patient was seen in the ER and had fluids and labs. Was evaluated for  dizziness and dehydration. The patient states he is feeling much better since the ER visit. Education provided. Will continue to monitor. Appointments in place.   Symptom Management: Take medications as prescribed   Attend all scheduled provider appointments Call pharmacy for medication refills 3-7 days in advance of running out of medications Perform all self care activities independently  Perform IADL's (shopping, preparing meals, housekeeping, managing finances) independently Call provider office for new concerns or questions  call the Suicide and Crisis Lifeline: 988 call the [place] National Suicide Prevention Lifeline: (579)810-6876 or TTY: 214-849-4410 TTY 305-749-1848) to talk to a trained counselor call 1-800-273-TALK (toll  free, 24 hour hotline) if experiencing a Mental Health or Behavioral Health Crisis   Follow Up Plan: Telephone follow up appointment with care management team member scheduled for: 09-03-2022 at 100 pm       CCM Expected Outcome:  Monitor, Self-Manage, and Reduce Symptoms of Hypertension       Current Barriers:   Knowledge Deficits related to effective management of HTN Chronic Disease Management support and education needs related to HTN and heart health BP Readings from Last 3 Encounters:  07/29/22 125/78  06/23/22 106/62  12/29/21 128/66  See chart for more readings  Planned Interventions: Evaluation of current treatment plan related to hypertension self management and patient's adherence to plan as established by provider. The patient was in the ER on 07-29-2022 for dizziness and dehydration. Received fluid, dizziness resolved. Recommended to the patients spouse to call the cardiologist for follow up, she did do this and the appointment is on 08-05-2022 at 0915 am. Will send in basket message to the pcp for assistance with follow up with the patient post ED visit. ;   Provided education to patient re: stroke prevention, s/s of heart attack and stroke; Reviewed prescribed diet Heart Healthy. The patient is compliant with heart healthy diet. The patient is losing weight. He and his wife are taking a supplement called Lean Belly. He is at 213 today. Feels great and is also going to the [facility] and doing the silver sneakers program. Praised the patient for positive changes.  Reviewed medications with patient and discussed importance of compliance. The patient is compliant with medications. The patient and his wife work with the pharm D on a regular basis. Denies any acute findings today.  Discussed plans with patient for ongoing care management follow up and provided patient with direct contact information for care management team; Advised patient, providing education and rationale, to monitor blood pressure daily and record, calling PCP for findings outside established parameters. Blood pressures are stable. Denies any acute findings related to blood pressures or heart health;  07-30-2022 111/61, p-68 sitting  [id number] 156/97 standing  07-30-2022 114/61, pulse- 61 sitting  07-30-2022 96/62, p-74   07-30-2022  117/71-85   07-31-2022 96/62, p-74 laying  07-31-2022 117/71, p-85 laying  07-31-2022 136/77, p-64 standing  07-31-2022 175/84, p-74   07-31-2022 231/128, p-87 standing  07-31-2022 109/63, p-77 laying  08-01-2022 159/81, p-81   08-01-2022 129/63, p-74 sitting  08-01-2022 160/83, p-75   08-02-2022 137/75 laying  08-02-2022 121/65 sitting  08-02-2022 135/71, p-76 Standing    Reviewed scheduled/upcoming provider appointments including: has an appointment for 08-05-2022 with cardiologist, the office to call and get a follow up with pcp post ER visit on 07-29-2022 Advised patient to discuss changes in HTN or heart health with provider; Provided education on prescribed diet heart healthy. Review and education provided;  Discussed complications of poorly controlled blood pressure such as heart disease, stroke, circulatory complications, vision complications, kidney impairment, sexual dysfunction;  Screening for signs and symptoms of depression related to chronic disease state;  Assessed social determinant of health barriers;  The patients family is helping more with the patient needs. Their granddaughter will be going with them to all of their upcoming appointments.  Extensive review of orthostatic hypotension since the patient is having some dizziness. May be dehydrated as well. Spoke directly to the patient and his wife, he has agreed to go to the ER to be checked out. The patient was checked in the Er on 07-29-2022. He had some hypotension  and dehydration. Had some changes in his medications. Education given to the patients wife today the need o follow up with the cardiologist since there had been some changes in Atenolol dosage and he has only been taking 25mg  the last several days. Is having fluctuations in his blood pressures with some being <100 systolic. The patients wife verbalized understanding.   Symptom Management: Take medications as prescribed   Attend all scheduled provider appointments Call  pharmacy for medication refills 3-7 days in advance of running out of medications Call provider office for new concerns or questions  call the Suicide and Crisis Lifeline: 988 call the [place] National Suicide Prevention Lifeline: 218-106-1434 or TTY: (910)623-8774 TTY 561-225-8818) to talk to a trained counselor call 1-800-273-TALK (toll free, 24 hour hotline) if experiencing a Mental Health or Behavioral Health Crisis  check blood pressure weekly learn about high blood pressure call doctor for signs and symptoms of high blood pressure develop an action plan for high blood pressure keep all doctor appointments take medications for blood pressure exactly as prescribed report new symptoms to your doctor  Follow Up Plan: Telephone follow up appointment with care management team member scheduled for: 09-03-2022 at 100 pm          Plan:Telephone follow up appointment with care management team member scheduled for:  09-03-2022 at 1 pm  Alto Denver RN, MSN, CCM RN Care Manager  Chronic Care Management Direct Number: [phone]

## 2022-08-02 NOTE — Patient Instructions (Signed)
Please call the care guide team at [phone] if you need to cancel or reschedule your appointment.   If you are experiencing a Mental Health or Behavioral Health Crisis or need someone to talk to, please call the Suicide and Crisis Lifeline: 988 call the [place] National Suicide Prevention Lifeline: 219-197-3101 or TTY: (613)044-1919 TTY 6053127920) to talk to a trained counselor call 1-800-273-TALK (toll free, 24 hour hotline)   Following is a copy of the CCM Program Consent:  CCM service includes personalized support from designated clinical staff supervised by the physician, including individualized plan of care and coordination with other care providers 24/7 contact phone numbers for assistance for urgent and routine care needs. Service will only be billed when office clinical staff spend 20 minutes or more in a month to coordinate care. Only one practitioner may furnish and bill the service in a calendar month. The patient may stop CCM services at amy time (effective at the end of the month) by phone call to the office staff. The patient will be responsible for cost sharing (co-pay) or up to 20% of the service fee (after annual deductible is met)  Following is a copy of your full provider care plan:   Goals Addressed             This Visit's Progress    CCM Expected Outcome:  Monitor, Self-Manage and Reduce Symptoms of Heart Failure       Current Barriers:  Knowledge Deficits related to effective management of HF Chronic Disease Management support and education needs related to effective management of HF Wt Readings from Last 3 Encounters:  07/29/22 216 lb 14.9 oz (98.4 kg)  06/23/22 217 lb (98.4 kg)  03/05/22 200 lb (90.7 kg)  Self reported weight today is 213   Planned Interventions: Basic overview and discussion of pathophysiology of Heart Failure reviewed. The patient has been steadily losing weight by the use of a product called "Lean Belly". The patient denies any  issues with swelling or edema. The patient states he is doing well and denies any new concerns with heart failure or heart health. Review and patient continues to do well with management of his heart failure.  Provided education on low sodium diet. The patient is compliant with the heart healthy diet.  Reviewed Heart Failure Action Plan in depth. Knows how to effectively manage his heart failure. Assessed need for readable accurate scales in home. Has a scale. Provided education about placing scale on hard, flat surface Advised patient to weigh each morning after emptying bladder Discussed importance of daily weight and advised patient to weigh and record daily. Weight today was 213. Reviewed role of diuretics in prevention of fluid overload and management of heart failure. Is compliant with medications. Ask the patients wife to discuss maybe lowering his does of Lasix since the patient has lost weight and is not having the edema as he was. He is on 160 mg daily. Discussed the importance of keeping all appointments with provider. Appointment with the cardiologist for 08-05-2022   Provided patient with education about the role of exercise in the management of heart failure Advised patient to discuss changes in fluid balance or new concerns or questions related to heart failure with provider. The patient states he is stable.  The patient is interested in a life alert system for him and his wife. Information provided and gave additional resources for life alert system in the event that his insurance does not offer options.  The patient was  seen in the ER and had fluids and labs. Was evaluated for  dizziness and dehydration. The patient states he is feeling much better since the ER visit. Education provided. Will continue to monitor. Appointments in place.   Symptom Management: Take medications as prescribed   Attend all scheduled provider appointments Call pharmacy for medication refills 3-7 days in  advance of running out of medications Perform all self care activities independently  Perform IADL's (shopping, preparing meals, housekeeping, managing finances) independently Call provider office for new concerns or questions  call the Suicide and Crisis Lifeline: 988 call the [place] National Suicide Prevention Lifeline: 484-856-3166 or TTY: 940-425-7859 TTY 930 043 6573) to talk to a trained counselor call 1-800-273-TALK (toll free, 24 hour hotline) if experiencing a Mental Health or Behavioral Health Crisis   Follow Up Plan: Telephone follow up appointment with care management team member scheduled for: 09-03-2022 at 100 pm       CCM Expected Outcome:  Monitor, Self-Manage, and Reduce Symptoms of Hypertension       Current Barriers:  Knowledge Deficits related to effective management of HTN Chronic Disease Management support and education needs related to HTN and heart health BP Readings from Last 3 Encounters:  07/29/22 125/78  06/23/22 106/62  12/29/21 128/66  See chart for more readings  Planned Interventions: Evaluation of current treatment plan related to hypertension self management and patient's adherence to plan as established by provider. The patient was in the ER on 07-29-2022 for dizziness and dehydration. Received fluid, dizziness resolved. Recommended to the patients spouse to call the cardiologist for follow up, she did do this and the appointment is on 08-05-2022 at 0915 am. Will send in basket message to the pcp for assistance with follow up with the patient post ED visit. ;   Provided education to patient re: stroke prevention, s/s of heart attack and stroke; Reviewed prescribed diet Heart Healthy. The patient is compliant with heart healthy diet. The patient is losing weight. He and his wife are taking a supplement called Lean Belly. He is at 213 today. Feels great and is also going to the [facility] and doing the silver sneakers program. Praised the patient for positive changes.   Reviewed medications with patient and discussed importance of compliance. The patient is compliant with medications. The patient and his wife work with the pharm D on a regular basis. Denies any acute findings today.  Discussed plans with patient for ongoing care management follow up and provided patient with direct contact information for care management team; Advised patient, providing education and rationale, to monitor blood pressure daily and record, calling PCP for findings outside established parameters. Blood pressures are stable. Denies any acute findings related to blood pressures or heart health;  07-30-2022 111/61, p-68 sitting  [id number] 156/97 standing  07-30-2022 114/61, pulse- 61 sitting  07-30-2022 96/62, p-74   07-30-2022 117/71-85   07-31-2022 96/62, p-74 laying  07-31-2022 117/71, p-85 laying  07-31-2022 136/77, p-64 standing  07-31-2022 175/84, p-74   07-31-2022 231/128, p-87 standing  07-31-2022 109/63, p-77 laying  08-01-2022 159/81, p-81   08-01-2022 129/63, p-74 sitting  08-01-2022 160/83, p-75   08-02-2022 137/75 laying  08-02-2022 121/65 sitting  08-02-2022 135/71, p-76 Standing    Reviewed scheduled/upcoming provider appointments including: has an appointment for 08-05-2022 with cardiologist, the office to call and get a follow up with pcp post ER visit on 07-29-2022 Advised patient to discuss changes in HTN or heart health with provider; Provided education on prescribed diet heart healthy.  Review and education provided;  Discussed complications of poorly controlled blood pressure such as heart disease, stroke, circulatory complications, vision complications, kidney impairment, sexual dysfunction;  Screening for signs and symptoms of depression related to chronic disease state;  Assessed social determinant of health barriers;  The patients family is helping more with the patient needs. Their granddaughter will be going with them to all of their upcoming appointments.   Extensive review of orthostatic hypotension since the patient is having some dizziness. May be dehydrated as well. Spoke directly to the patient and his wife, he has agreed to go to the ER to be checked out. The patient was checked in the Er on 07-29-2022. He had some hypotension and dehydration. Had some changes in his medications. Education given to the patients wife today the need o follow up with the cardiologist since there had been some changes in Atenolol dosage and he has only been taking 25mg  the last several days. Is having fluctuations in his blood pressures with some being <100 systolic. The patients wife verbalized understanding.   Symptom Management: Take medications as prescribed   Attend all scheduled provider appointments Call pharmacy for medication refills 3-7 days in advance of running out of medications Call provider office for new concerns or questions  call the Suicide and Crisis Lifeline: 988 call the [place] National Suicide Prevention Lifeline: (778)761-0341 or TTY: 516-396-2124 TTY 9090706846) to talk to a trained counselor call 1-800-273-TALK (toll free, 24 hour hotline) if experiencing a Mental Health or Behavioral Health Crisis  check blood pressure weekly learn about high blood pressure call doctor for signs and symptoms of high blood pressure develop an action plan for high blood pressure keep all doctor appointments take medications for blood pressure exactly as prescribed report new symptoms to your doctor  Follow Up Plan: Telephone follow up appointment with care management team member scheduled for: 09-03-2022 at 100 pm          Patient verbalizes understanding of instructions and care plan provided today and agrees to view in MyChart. Active MyChart status and patient understanding of how to access instructions and care plan via MyChart confirmed with patient.  Telephone follow up appointment with care management team member scheduled for: 09-03-2022 at  100 pm

## 2022-08-05 ENCOUNTER — Encounter: Payer: Self-pay | Admitting: Medical

## 2022-08-05 ENCOUNTER — Ambulatory Visit: Payer: Medicare PPO | Attending: Medical | Admitting: Medical

## 2022-08-05 VITALS — BP 97/62 | HR 71 | Ht 68.0 in | Wt 215.0 lb

## 2022-08-05 DIAGNOSIS — I2510 Atherosclerotic heart disease of native coronary artery without angina pectoris: Secondary | ICD-10-CM | POA: Diagnosis not present

## 2022-08-05 DIAGNOSIS — Z951 Presence of aortocoronary bypass graft: Secondary | ICD-10-CM

## 2022-08-05 DIAGNOSIS — R42 Dizziness and giddiness: Secondary | ICD-10-CM | POA: Diagnosis not present

## 2022-08-05 DIAGNOSIS — I63412 Cerebral infarction due to embolism of left middle cerebral artery: Secondary | ICD-10-CM | POA: Diagnosis not present

## 2022-08-05 DIAGNOSIS — I951 Orthostatic hypotension: Secondary | ICD-10-CM | POA: Diagnosis not present

## 2022-08-05 NOTE — Patient Instructions (Signed)
Medication Instructions:   STOP spironolactone (ALDACTONE) 25 MG tablet  *If you need a refill on your cardiac medications before your next appointment, please call your pharmacy*   Lab Work:  None Ordered  If you have labs (blood work) drawn today and your tests are completely normal, you will receive your results only by: MyChart Message (if you have MyChart) OR A paper copy in the mail If you have any lab test that is abnormal or we need to change your treatment, we will call you to review the results.   Testing/Procedures:  None Ordered   Follow-Up: At [facility], you and your health needs are our priority.  As part of our continuing mission to provide you with exceptional heart care, we have created designated Provider Care Teams.  These Care Teams include your primary Cardiologist (physician) and Advanced Practice Providers (APPs -  Physician Assistants and Nurse Practitioners) who all work together to provide you with the care you need, when you need it.  We recommend signing up for the patient portal called "MyChart".  Sign up information is provided on this After Visit Summary.  MyChart is used to connect with patients for Virtual Visits (Telemedicine).  Patients are able to view lab/test results, encounter notes, upcoming appointments, etc.  Non-urgent messages can be sent to your provider as well.   To learn more about what you can do with MyChart, go to [email or web address].    Your next appointment:   3 month(s)  Provider:   Julien Nordmann, MD

## 2022-08-05 NOTE — Progress Notes (Signed)
Cardiology Office Note:    Date:  08/05/2022   ID:  Luke Mckee, DOB Feb 03, 1940, MRN [id number]  PCP:  [name], DO  [facility] Cardiologist:  Julien Nordmann, MD  [facility] Electrophysiologist:  None   Referring MD: Saralyn Pilar *   Chief Complaint: ER follow-up  History of Present Illness:    Luke Mckee is a 82 y.o. male with a hx of CAD s/p CABG x3 in [place] with subsequent stent placement, anxiety, hypertension, history of stroke x2, status post loop monitor with no A-fib detected who presents for follow-up.  Patient has a history of CAD with CABG x 3 in 2005. He underwent DES to OM SVG in 2018.  Patient reported another stent placement, however records unclear as to when or where.  Patient has a history of stroke x 2.  He has a loop recorder implanted with no evidence of A-fib so far. The look recorder is no longer transmitting, but still in place.  He was last seen in August 2023 and overall doing well from a cardiac perspective.  Patient was seen in the ER 07/29/2022 for pre-syncope and dizziness.  Blood pressure 155/114 pulse 65.  EKG showed normal sinus rhythm.  Labs were reassuring.  CT head and MRI brain were nonacute.  Patient was given meclizine and fluids and Atenolol was cut in half.  Today, the patient reports dizziness when he stands up. He reports dizziness is improving. He has occasional dizziness when he changes positions. He denies chest pain, shortness of breath, lower leg edema. Orthostatics are negative.    Past Medical History:  Diagnosis Date   Anxiety    Arthritis    CAD (coronary artery disease) 2005   s/p CABG, DES   CHF (congestive heart failure) (HCC)    in EPIC care everywhere   Chronic kidney disease    COPD (chronic obstructive pulmonary disease) (HCC)    in EPIC care everywhere   Depression    Heart murmur    History of stroke    Hypothyroidism    OSA (obstructive sleep apnea)    Paroxysmal A-fib  (HCC)    in EPIC careeverywhere    Past Surgical History:  Procedure Laterality Date   APPENDECTOMY     BUNIONECTOMY     CATARACT EXTRACTION     Right eye   CATARACT EXTRACTION W/PHACO Left 01/19/2021   Procedure: CATARACT EXTRACTION PHACO AND INTRAOCULAR LENS PLACEMENT (IOC) LEFT 3.09 00:28.3;  Surgeon: Nevada Crane, MD;  Location: [facility];  Service: Ophthalmology;  Laterality: Left;   COLONOSCOPY  07/06/2004   CORONARY ARTERY BYPASS GRAFT  2005   LOOP RECORDER INSERTION N/A 07/25/2018   Procedure: LOOP RECORDER INSERTION;  Surgeon: Regan Lemming, MD;  Location: [facility];  Service: Cardiovascular;  Laterality: N/A;   TOTAL HIP ARTHROPLASTY Right 11/23/2011   TOTAL HIP ARTHROPLASTY Left 09/07/2011   TOTAL KNEE ARTHROPLASTY Right 02/15/2018   VASECTOMY  1978    Current Medications: Current Meds  Medication Sig   acetaminophen (TYLENOL) 650 MG CR tablet Take 1,300 mg by mouth at bedtime as needed.   albuterol (VENTOLIN HFA) 108 (90 Base) MCG/ACT inhaler Inhale 1-2 puffs into the lungs every 4 (four) hours as needed for wheezing or shortness of breath (cough).   aspirin EC 81 MG tablet Take 81 mg by mouth daily.   atenolol (TENORMIN) 50 MG tablet Take 1 tablet (50 mg total) by mouth daily. (Patient taking differently: Take  25 mg by mouth daily.)   atorvastatin (LIPITOR) 80 MG tablet Take 1 tablet (80 mg total) by mouth daily.   Calcium Carb-Cholecalciferol (CALCIUM 600 + D PO) Take 1 tablet by mouth daily.   Cholecalciferol (VITAMIN D) 50 MCG (2000 UT) tablet Take 2,000 Units by mouth daily.   clopidogrel (PLAVIX) 75 MG tablet Take 1 tablet (75 mg total) by mouth daily.   clotrimazole-betamethasone (LOTRISONE) cream Apply twice a day for 1-2 weeks, may repeat if need in future   co-enzyme Q-10 30 MG capsule Take 100 mg by mouth daily.   colchicine 0.6 MG tablet For acute gout flare take 2 pills at once, then take a 3rd pill 2 hours later. Then take 1  pill daily for 7-10 days or until resolved.   diclofenac sodium (VOLTAREN) 1 % GEL Apply 2 g topically 4 (four) times daily as needed for pain. Foot pain   furosemide (LASIX) 80 MG tablet TAKE 1 TABLET(80 MG) BY MOUTH TWICE DAILY   ipratropium (ATROVENT) 0.06 % nasal spray Place 2 sprays into both nostrils 4 (four) times daily as needed for rhinitis.   levothyroxine (SYNTHROID) 125 MCG tablet TAKE 1 TABLET(125 MCG) BY MOUTH DAILY BEFORE BREAKFAST   loratadine (CLARITIN) 10 MG tablet Take 10 mg by mouth daily.   LORazepam (ATIVAN) 0.5 MG tablet Take 1 tablet (0.5 mg total) by mouth daily as needed for anxiety or sleep.   meclizine (ANTIVERT) 12.5 MG tablet Take 1 tablet (12.5 mg total) by mouth 3 (three) times daily as needed for dizziness.   montelukast (SINGULAIR) 10 MG tablet Take 1 tablet (10 mg total) by mouth at bedtime.   Multiple Vitamin (MULTIVITAMIN) tablet Take 1 tablet by mouth daily.   nitroGLYCERIN (NITROSTAT) 0.4 MG SL tablet Place 1 tablet (0.4 mg total) under the tongue every 5 (five) minutes as needed for chest pain.   PATADAY 0.1 % ophthalmic solution Place into both eyes as needed.    sennosides-docusate sodium (SENOKOT-S) 8.6-50 MG tablet Take 1-2 tablets by mouth daily as needed for constipation.   sertraline (ZOLOFT) 100 MG tablet Take 2 tablets (200 mg total) by mouth daily.   traZODone (DESYREL) 50 MG tablet Take 1 tablet (50 mg total) by mouth at bedtime.   [DISCONTINUED] spironolactone (ALDACTONE) 25 MG tablet Take 0.5 tablets (12.5 mg total) by mouth daily.     Allergies:   Ace inhibitors, Cephalexin, Crestor [rosuvastatin calcium], and Tape   Social History   Socioeconomic History   Marital status: Married    Spouse name: Not on file   Number of children: Not on file   Years of education: Not on file   Highest education level: Not on file  Occupational History   Occupation: retired  Tobacco Use   Smoking status: Never   Smokeless tobacco: Never  Vaping Use    Vaping status: Never Used  Substance and Sexual Activity   Alcohol use: Not Currently    Comment: past   Drug use: Never   Sexual activity: Not on file  Other Topics Concern   Not on file  Social History Narrative   Not on file   Social Determinants of Health   Financial Resource Strain: Low Risk  (09/25/2021)   Overall Financial Resource Strain (CARDIA)    Difficulty of Paying Living Expenses: Not hard at all  Food Insecurity: No Food Insecurity (09/25/2021)   Hunger Vital Sign    Worried About Running Out of Food in the Last Year: Never  true    Ran Out of Food in the Last Year: Never true  Transportation Needs: No Transportation Needs (11/03/2021)   PRAPARE - Administrator, Civil Service (Medical): No    Lack of Transportation (Non-Medical): No  Physical Activity: Insufficiently Active (09/25/2021)   Exercise Vital Sign    Days of Exercise per Week: 2 days    Minutes of Exercise per Session: 60 min  Stress: No Stress Concern Present (09/25/2021)   [employer] of Occupational Health - Occupational Stress Questionnaire    Feeling of Stress : Not at all  Social Connections: Moderately Integrated (09/25/2021)   Social Connection and Isolation Panel [NHANES]    Frequency of Communication with Friends and Family: More than three times a week    Frequency of Social Gatherings with Friends and Family: More than three times a week    Attends Religious Services: More than 4 times per year    Active Member of [employer] or Organizations: No    Attends Banker Meetings: Never    Marital Status: Married     Family History: The patient's family history includes Anxiety disorder in his sister.  ROS:   Please see the history of present illness.     All other systems reviewed and are negative.  EKGs/Labs/Other Studies Reviewed:    The following studies were reviewed today:  Echo 2020 1. The left ventricle has a visually estimated ejection fraction of 50%.   The cavity size was normal. Left ventricular diastolic Doppler parameters  are consistent with impaired relaxation. Mild septal hypokinesis.   2. Normal RV size with mildly decreased RV systolic function.   3. Right atrial size was mildly dilated.   4. Trivial pericardial effusion is present.   5. Moderate calcification of the mitral valve leaflet. No evidence of  mitral valve stenosis. No significant mitral regurgitation.   6. The aortic valve is tricuspid. Mild calcification of the aortic valve.  Aortic valve regurgitation is trivial by color flow Doppler.   7. There is mild dilatation of the aortic root measuring 37 mm.   8. The IVC was normal in size. PA systolic pressure 25 mmHg.   EKG:  EKG is  ordered today.  The ekg ordered today demonstrates NSR 71bpm, inf q waves, no ST.T wave changes  Recent Labs: 12/22/2021: TSH 3.67 07/29/2022: ALT 21; B Natriuretic Peptide 71.6; BUN 20; Creatinine, Ser 1.07; Hemoglobin 14.1; Platelets 230; Potassium 3.8; Sodium 135  Recent Lipid Panel    Component Value Date/Time   CHOL 127 12/22/2021 0826   TRIG 143 12/22/2021 0826   HDL 42 12/22/2021 0826   CHOLHDL 3.0 12/22/2021 0826   VLDL 25 07/24/2018 0458   LDLCALC 62 12/22/2021 0826     Physical Exam:    VS:  BP 97/62 (BP Location: Left Arm, Patient Position: Sitting, Cuff Size: Normal)   Pulse 71   Ht 5\' 8"  (1.727 m)   Wt 215 lb (97.5 kg)   SpO2 93%   BMI 32.69 kg/m     Wt Readings from Last 3 Encounters:  08/05/22 215 lb (97.5 kg)  07/29/22 216 lb 14.9 oz (98.4 kg)  06/23/22 217 lb (98.4 kg)     GEN:  Well nourished, well developed in no acute distress HEENT: Normal NECK: No JVD; No carotid bruits LYMPHATICS: No lymphadenopathy CARDIAC: RRR, no murmurs, rubs, gallops RESPIRATORY:  Clear to auscultation without rales, wheezing or rhonchi  ABDOMEN: Soft, non-tender, non-distended MUSCULOSKELETAL:  No edema;  No deformity  SKIN: Warm and dry NEUROLOGIC:  Alert and oriented x  3 PSYCHIATRIC:  Normal affect   ASSESSMENT:    1. Postural dizziness with presyncope   2. Orthostasis   3. Coronary artery disease involving native coronary artery of native heart without angina pectoris   4. Hx of CABG   5. Cerebrovascular accident (CVA) due to embolism of left middle cerebral artery (HCC)    PLAN:    In order of problems listed above:  Dizziness Pre-syncope Orthostasis Recent ER visit for dizziness and presyncope.  Labs showed minimal dehydration.  Otherwise workup was overall unremarkable.  He was given meclizine and atenolol was decreased.  Patient is overall feeling much better with occasional dizziness upon standing.  Blood pressure today is low at 97/62.  Orthostatics however are negative.  Patient is taking atenolol 25 mg daily, Lasix 80 mg twice daily and spironolactone 12.5 mg daily.  Patient has increased water intake.  I will stop spironolactone.  Patient will continue to monitor symptoms and blood pressure.  CAD s/p CABG with subsequent stenting Patient denies any chest pain or shortness of breath.  No recent ischemic workup.  Continue aspirin, Plavix, Lipitor, atenolol.  No further ischemic workup indicated at this time.  History of stroke Initially had right-sided weakness and vision changes.  Continue aspirin, Plavix, Lipitor.  S/p recorder S/p loop recorder given stroke. Most recent transmission was in 2023.  Patient decided to leave in the device.  No A-fib noted.  Disposition: Follow up in 3 month(s) with MD   Signed, Lashaya Kienitz David Stall, PA-C  08/05/2022 10:05 AM    [facility]

## 2022-08-10 DIAGNOSIS — G4733 Obstructive sleep apnea (adult) (pediatric): Secondary | ICD-10-CM | POA: Diagnosis not present

## 2022-08-11 DIAGNOSIS — I5020 Unspecified systolic (congestive) heart failure: Secondary | ICD-10-CM

## 2022-08-11 DIAGNOSIS — I110 Hypertensive heart disease with heart failure: Secondary | ICD-10-CM

## 2022-08-13 ENCOUNTER — Encounter: Payer: Self-pay | Admitting: Physician Assistant

## 2022-08-13 ENCOUNTER — Ambulatory Visit (INDEPENDENT_AMBULATORY_CARE_PROVIDER_SITE_OTHER): Payer: Medicare PPO | Admitting: Physician Assistant

## 2022-08-13 VITALS — BP 110/62 | HR 67 | Temp 96.2°F | Wt 215.0 lb

## 2022-08-13 DIAGNOSIS — F5104 Psychophysiologic insomnia: Secondary | ICD-10-CM | POA: Diagnosis not present

## 2022-08-13 DIAGNOSIS — R42 Dizziness and giddiness: Secondary | ICD-10-CM

## 2022-08-13 MED ORDER — MECLIZINE HCL 12.5 MG PO TABS
12.5000 mg | ORAL_TABLET | Freq: Three times a day (TID) | ORAL | 0 refills | Status: DC | PRN
Start: 2022-08-13 — End: 2022-09-15

## 2022-08-13 MED ORDER — TRAZODONE HCL 50 MG PO TABS
50.0000 mg | ORAL_TABLET | Freq: Every day | ORAL | Status: DC
Start: 2022-08-13 — End: 2023-07-07

## 2022-08-13 NOTE — Progress Notes (Unsigned)
Acute Office Visit   Patient: Luke Mckee   DOB: 05-Sep-1940   82 y.o. Male  MRN: [id number] Visit Date: 08/13/2022  Today's healthcare provider: Oswaldo Conroy , PA-C  Introduced myself to the patient as a Secondary school teacher and provided education on APPs in clinical practice.    Chief Complaint  Patient presents with   Follow-up    ER follow up    Subjective    HPI HPI     Follow-up    Additional comments: ER follow up       Last edited by Paschal Dopp, CMA on 08/13/2022 11:15 AM.         ED follow up for dizziness He reports he is still having dizziness almost every time he stands up  He states he fell last night out of bed - says he slid off his bed and sat on the floor. He denies hitting his head or losing  He denies feeling like he will pass out with dizziness He states the medication changes have not changed the dizziness frequency  He denies dizziness with turning head but states bending over is an issue He reports his head feels like "jello"   He states the Meclizine is helping a bit    Reviewed ED visit note and Cardiology note from 08/05/22  His Atenolol was decreased and Cardiology dc spironolactone  Orthostatics were negative at Cardio and ED labs showed some mild dehydration   He is also concerned for RLS Reports RLS symptoms seem to start when he goes to bed and lays down but is not able to go to sleep  He is taking trazodone and sertraline at night  but reports ongoing issues with falling asleep  He only takes Lorazepam as needed for anxiety - usually about 1-2 every few weeks, on avg about 4 times per month     Medications: Outpatient Medications Prior to Visit  Medication Sig   acetaminophen (TYLENOL) 650 MG CR tablet Take 1,300 mg by mouth at bedtime as needed.   albuterol (VENTOLIN HFA) 108 (90 Base) MCG/ACT inhaler Inhale 1-2 puffs into the lungs every 4 (four) hours as needed for wheezing or shortness of breath (cough).   aspirin EC 81 MG  tablet Take 81 mg by mouth daily.   atenolol (TENORMIN) 50 MG tablet Take 1 tablet (50 mg total) by mouth daily. (Patient taking differently: Take 25 mg by mouth daily.)   atorvastatin (LIPITOR) 80 MG tablet Take 1 tablet (80 mg total) by mouth daily.   Calcium Carb-Cholecalciferol (CALCIUM 600 + D PO) Take 1 tablet by mouth daily.   Cholecalciferol (VITAMIN D) 50 MCG (2000 UT) tablet Take 2,000 Units by mouth daily.   clopidogrel (PLAVIX) 75 MG tablet Take 1 tablet (75 mg total) by mouth daily.   clotrimazole-betamethasone (LOTRISONE) cream Apply twice a day for 1-2 weeks, may repeat if need in future   co-enzyme Q-10 30 MG capsule Take 100 mg by mouth daily.   colchicine 0.6 MG tablet For acute gout flare take 2 pills at once, then take a 3rd pill 2 hours later. Then take 1 pill daily for 7-10 days or until resolved.   diclofenac sodium (VOLTAREN) 1 % GEL Apply 2 g topically 4 (four) times daily as needed for pain. Foot pain   furosemide (LASIX) 80 MG tablet TAKE 1 TABLET(80 MG) BY MOUTH TWICE DAILY   ipratropium (ATROVENT) 0.06 % nasal spray Place 2 sprays into  both nostrils 4 (four) times daily as needed for rhinitis.   levothyroxine (SYNTHROID) 125 MCG tablet TAKE 1 TABLET(125 MCG) BY MOUTH DAILY BEFORE BREAKFAST   loratadine (CLARITIN) 10 MG tablet Take 10 mg by mouth daily.   LORazepam (ATIVAN) 0.5 MG tablet Take 1 tablet (0.5 mg total) by mouth daily as needed for anxiety or sleep.   montelukast (SINGULAIR) 10 MG tablet Take 1 tablet (10 mg total) by mouth at bedtime.   Multiple Vitamin (MULTIVITAMIN) tablet Take 1 tablet by mouth daily.   nitroGLYCERIN (NITROSTAT) 0.4 MG SL tablet Place 1 tablet (0.4 mg total) under the tongue every 5 (five) minutes as needed for chest pain.   PATADAY 0.1 % ophthalmic solution Place into both eyes as needed.    sennosides-docusate sodium (SENOKOT-S) 8.6-50 MG tablet Take 1-2 tablets by mouth daily as needed for constipation.   sertraline (ZOLOFT) 100 MG  tablet Take 2 tablets (200 mg total) by mouth daily.   [DISCONTINUED] meclizine (ANTIVERT) 12.5 MG tablet Take 1 tablet (12.5 mg total) by mouth 3 (three) times daily as needed for dizziness.   [DISCONTINUED] traZODone (DESYREL) 50 MG tablet Take 1 tablet (50 mg total) by mouth at bedtime.   No facility-administered medications prior to visit.    Review of Systems  Respiratory:  Negative for shortness of breath and wheezing.   Cardiovascular:  Negative for chest pain, palpitations and leg swelling.  Gastrointestinal:  Negative for diarrhea, nausea and vomiting.  Neurological:  Positive for dizziness and light-headedness. Negative for seizures, facial asymmetry, weakness, numbness and headaches.         Objective    BP 110/62 (BP Location: Left Arm, Patient Position: Sitting, Cuff Size: Large)   Pulse 67   Temp (!) 96.2 F (35.7 C) (Temporal)   Wt 215 lb (97.5 kg)   SpO2 97%   BMI 32.69 kg/m      Physical Exam Vitals reviewed.  Constitutional:      General: He is awake.     Appearance: Normal appearance. He is well-developed and well-groomed.  HENT:     Head: Normocephalic and atraumatic.     Right Ear: Tympanic membrane and ear canal normal.     Left Ear: Tympanic membrane and ear canal normal.     Ears:     Comments: Wearing hearing aides today  Eyes:     General: Lids are normal. Gaze aligned appropriately.     Extraocular Movements: Extraocular movements intact.     Right eye: Normal extraocular motion and no nystagmus.     Left eye: Normal extraocular motion and no nystagmus.     Conjunctiva/sclera: Conjunctivae normal.     Pupils: Pupils are equal, round, and reactive to light.  Cardiovascular:     Rate and Rhythm: Normal rate and regular rhythm.     Pulses: Normal pulses.          Radial pulses are 2+ on the right side and 2+ on the left side.     Heart sounds: Normal heart sounds. No murmur heard.    No friction rub. No gallop.  Pulmonary:     Effort:  Pulmonary effort is normal.     Breath sounds: Normal breath sounds. No decreased air movement. No decreased breath sounds, wheezing, rhonchi or rales.  Musculoskeletal:     Right lower leg: No edema.     Left lower leg: No edema.  Neurological:     General: No focal deficit present.  Mental Status: He is alert and oriented to person, place, and time.     GCS: GCS eye subscore is 4. GCS verbal subscore is 5. GCS motor subscore is 6.     Cranial Nerves: No cranial nerve deficit, dysarthria or facial asymmetry.     Motor: No weakness, tremor, atrophy or abnormal muscle tone.  Psychiatric:        Attention and Perception: Attention and perception normal.        Mood and Affect: Mood and affect normal.        Speech: Speech normal.        Behavior: Behavior normal. Behavior is cooperative.       No results found for any visits on 08/13/22.  Assessment & Plan      No follow-ups on file.      Problem List Items Addressed This Visit       Other   Dizziness on standing - Primary    Acute, new concern  Patient was seen in ED for dizziness and is still having spells almost every time he stands up or bends over He has had several medication changes to assist with dizziness but denies improvement in symptoms Reviewed ED visit and Cardiology notes - orthostatics were normal at Cardiology and there was mild dehydration at ED per labs PE is overall reassuring today- recommend trying vestibular rehab for dizziness  Referral placed to PT services today Will provide refill of Meclizine to assist with symptoms pending PT evaluation Follow up as needed for persistent or progressing symptoms        Relevant Medications   meclizine (ANTIVERT) 12.5 MG tablet   Other Relevant Orders   Ambulatory referral to Physical Therapy   Other Visit Diagnoses     Psychophysiologic insomnia     Patient reports concerns for restless legs at night when he is having trouble sleeping Recommend  increasing Trazodone for now to see if this improves sleep  Make take 1-2 tablets at bedtime as needed Follow up as needed for persistent or progressing symptoms     Relevant Medications   traZODone (DESYREL) 50 MG tablet        No follow-ups on file.   I,  E , PA-C, have reviewed all documentation for this visit. The documentation on 08/16/22 for the exam, diagnosis, procedures, and orders are all accurate and complete.   Jacquelin Hawking, MHS, PA-C [facility]

## 2022-08-13 NOTE — Patient Instructions (Signed)
Try increasing your Trazodone to two tablets per night to help with your sleep and restless legs  This can be sedating so you should not mix other medications with similar effects such as the lorazepam   The Physical therapy group will call you soon to schedule an apt for vestibular rehab - please be on the lookout for their phone call

## 2022-08-16 DIAGNOSIS — R42 Dizziness and giddiness: Secondary | ICD-10-CM | POA: Insufficient documentation

## 2022-08-16 NOTE — Assessment & Plan Note (Addendum)
Acute, new concern  Patient was seen in ED for dizziness and is still having spells almost every time he stands up or bends over He has had several medication changes to assist with dizziness but denies improvement in symptoms Reviewed ED visit and Cardiology notes - orthostatics were normal at Cardiology and there was mild dehydration at ED per labs PE is overall reassuring today- recommend trying vestibular rehab for dizziness  Referral placed to PT services today Will provide refill of Meclizine to assist with symptoms pending PT evaluation Follow up as needed for persistent or progressing symptoms

## 2022-08-30 DIAGNOSIS — Z8673 Personal history of transient ischemic attack (TIA), and cerebral infarction without residual deficits: Secondary | ICD-10-CM | POA: Diagnosis not present

## 2022-08-30 DIAGNOSIS — R42 Dizziness and giddiness: Secondary | ICD-10-CM | POA: Diagnosis not present

## 2022-08-30 DIAGNOSIS — R296 Repeated falls: Secondary | ICD-10-CM | POA: Diagnosis not present

## 2022-08-30 DIAGNOSIS — I951 Orthostatic hypotension: Secondary | ICD-10-CM | POA: Diagnosis not present

## 2022-08-31 ENCOUNTER — Telehealth: Payer: Self-pay

## 2022-08-31 NOTE — Patient Outreach (Signed)
  Care Management   Outreach Note  08/31/2022 Name: Xue Overton MRN: [id number] DOB: August 27, 1940  Incoming call that went to VM from the patients wife, Duilio Stetler. Detailed information left on the VM for the RNCM. The patient saw the neurologist yesterday and the neurologist wants to change some medications and wanted to talk to the clinical pharmacist. Reviewed the notes from neurologist and have sent the pcp and pharm D a message with request to follow up with the PA at [facility].   Follow Up Plan:  The care management team will reach out to the patient again over the next 30 days.   Alto Denver RN, MSN, CCM RN Care Manager  [facility]  Ambulatory Care Management  Direct Number: [phone]

## 2022-09-01 ENCOUNTER — Ambulatory Visit: Payer: Medicare PPO | Admitting: Pharmacist

## 2022-09-01 DIAGNOSIS — F5104 Psychophysiologic insomnia: Secondary | ICD-10-CM

## 2022-09-01 NOTE — Progress Notes (Signed)
   09/01/2022  Patient ID: [name], male   DOB: 28-Apr-1940, 81 y.o.   MRN: [id number]  Receive a message from Nurse Care Manager Alto Denver advising patient requesting that I outreach to his Neurologist, PA Nilda Calamity at [facility], regarding plan to transition patient from Zoloft to Lexapro.  Note patient currently taking sertraline (Zoloft) 100 mg - 2 tablets (200 mg) daily  Outreach to [facility] today to collaborate with provider. Reach provider's CMA Megan who advises per provider, "plan to decrease Zoloft 200 mg for 1 week, then discontinue. Then start Lexapro 10 mg once daily for 1 week, then increase to 20 mg and continue".   Given patient's current high dose of sertraline, recommend tapering dose down to at least 100 mg daily prior to making switch to Lexapro. Aundra Millet states that she will share this recommendation with provider.  Estelle Grumbles, PharmD, Patsy [place], CPP Clinical Pharmacist [facility] [phone]

## 2022-09-03 ENCOUNTER — Other Ambulatory Visit: Payer: Medicare PPO

## 2022-09-03 ENCOUNTER — Other Ambulatory Visit: Payer: Self-pay

## 2022-09-03 NOTE — Patient Instructions (Signed)
Visit Information  Thank you for taking time to visit with me today. Please don't hesitate to contact me if I can be of assistance to you before our next scheduled telephone appointment.  Following are the goals we discussed today:   Goals Addressed             This Visit's Progress    RNCM Care Management  Expected Outcome:  Monitor, Self-Manage, and Reduce Symptoms of Hypertension       Current Barriers:  Knowledge Deficits related to effective management of HTN Chronic Disease Management support and education needs related to HTN and heart health BP Readings from Last 3 Encounters:  08/13/22 110/62  08/05/22 97/62  07/29/22 125/78  See chart for more readings  Planned Interventions: Evaluation of current treatment plan related to hypertension self management and patient's adherence to plan as established by provider. The patient is doing good. He has seen the neurologist and will be going to the [place] on 10-11-2022 to have a vestibular assessment. Explained what a vestibular assessment was and the patient and his wife verbalized understanding. The patients wife states that she is monitoring his blood pressures at home and also he is wearing ted hose now and that is helping with his circulation . ;   Provided education to patient re: stroke prevention, s/s of heart attack and stroke; Reviewed prescribed diet Heart Healthy. The patient is compliant with heart healthy diet. The patient is losing weight. He and his wife are taking a supplement called Lean Belly. Feels great and is also going to the [facility] and doing the silver sneakers program. Praised the patient for positive changes.  Reviewed medications with patient and discussed importance of compliance. The patient is compliant with medications. The patient and his wife work with the pharm D on a regular basis. Denies any acute findings today. Review of the changes from the sertraline to the Lexapro and the patients wife knows how to taper  down the dose and the recommendations. She has talked to the pharm D this week also. Has a good understanding and will call for new questions or concerns  Discussed plans with patient for ongoing care management follow up and provided patient with direct contact information for care management team; Advised patient, providing education and rationale, to monitor blood pressure daily and record, calling PCP for findings outside established parameters. Blood pressures are stable. Denies any acute findings related to blood pressures or heart health;  09-02-2022 141/73, p-63 standing  8-22-12024 153/80, p-64 Standing late afternoon   Reviewed scheduled/upcoming provider appointments including: has an appointment for 09-07-2022 for a vestibular assessment, the patient will go to the [place] on 10-11-2022 to have an assessment through the [place]. Education provided.  Advised patient to discuss changes in HTN or heart health with provider; Provided education on prescribed diet heart healthy. Review and education provided;  Discussed complications of poorly controlled blood pressure such as heart disease, stroke, circulatory complications, vision complications, kidney impairment, sexual dysfunction;  Screening for signs and symptoms of depression related to chronic disease state;  Assessed social determinant of health barriers;  The patients family is helping more with the patient needs. Their granddaughter will be going with them to all of their upcoming appointments.  Extensive review of orthostatic hypotension since the patient is having some dizziness. The patient states that his dizziness is better. He has started taking Meclizine BID. He is changing his position slowly and denies any acute findings today. Will continue to monitor.  Symptom Management: Take medications as prescribed   Attend all scheduled provider appointments Call pharmacy for medication refills 3-7 days in advance of running out of  medications Call provider office for new concerns or questions  call the Suicide and Crisis Lifeline: 988 call the [place] National Suicide Prevention Lifeline: 407-785-9825 or TTY: 609-426-4097 TTY 812 464 2321) to talk to a trained counselor call 1-800-273-TALK (toll free, 24 hour hotline) if experiencing a Mental Health or Behavioral Health Crisis  check blood pressure weekly learn about high blood pressure call doctor for signs and symptoms of high blood pressure develop an action plan for high blood pressure keep all doctor appointments take medications for blood pressure exactly as prescribed report new symptoms to your doctor  Follow Up Plan: Telephone follow up appointment with care management team member scheduled for: 10-19-2022 at 100 pm       RNCM Care Management:  Expected Outcome:  Monitor, Self-Manage and Reduce Symptoms of Heart Failure       Current Barriers:  Knowledge Deficits related to effective management of HF Chronic Disease Management support and education needs related to effective management of HF Wt Readings from Last 3 Encounters:  08/13/22 215 lb (97.5 kg)  08/05/22 215 lb (97.5 kg)  07/29/22 216 lb 14.9 oz (98.4 kg)    Planned Interventions: Basic overview and discussion of pathophysiology of Heart Failure reviewed. The patient has been steadily losing weight by the use of a product called "Lean Belly". The patient denies any issues with swelling or edema. The patient states he is doing well and denies any new concerns with heart failure or heart health. Review and patient continues to do well with management of his heart failure.  Provided education on low sodium diet. The patient is compliant with the heart healthy diet.  Reviewed Heart Failure Action Plan in depth. Knows how to effectively manage his heart failure. Assessed need for readable accurate scales in home. Has a scale. Provided education about placing scale on hard, flat surface Advised  patient to weigh each morning after emptying bladder Discussed importance of daily weight and advised patient to weigh and record daily. Weight today was 215 Reviewed role of diuretics in prevention of fluid overload and management of heart failure. Is compliant with medications. Ask the patients wife to discuss maybe lowering his does of Lasix since the patient has lost weight and is not having the edema as he was. He is on 160 mg daily. He is also using compression hose and this is helping Discussed the importance of keeping all appointments with provider. Saw neurologist recently and has several upcoming appointments with specialist. Knows to call the office for changes or new needs.  Provided patient with education about the role of exercise in the management of heart failure Advised patient to discuss changes in fluid balance or new concerns or questions related to heart failure with provider. The patient states he is stable.  The patient is interested in a life alert system for him and his wife. Information provided and gave additional resources for life alert system in the event that his insurance does not offer options.  The patient will get new hearing aides when he goes to the [place] the end of September. They will make sure they fit well and adjust accordingly.   Symptom Management: Take medications as prescribed   Attend all scheduled provider appointments Call pharmacy for medication refills 3-7 days in advance of running out of medications Perform all self care activities independently  Perform IADL's (shopping, preparing meals, housekeeping, managing finances) independently Call provider office for new concerns or questions  call the Suicide and Crisis Lifeline: 988 call the [place] National Suicide Prevention Lifeline: 509-067-6391 or TTY: 236-437-7548 TTY (620)330-7074) to talk to a trained counselor call 1-800-273-TALK (toll free, 24 hour hotline) if experiencing a Mental Health or  Behavioral Health Crisis   Follow Up Plan: Telephone follow up appointment with care management team member scheduled for: 10-19-2022 at 100 pm           Our next appointment is by telephone on 10-19-2022 at 1 pm  Please call the care guide team at [phone] if you need to cancel or reschedule your appointment.   If you are experiencing a Mental Health or Behavioral Health Crisis or need someone to talk to, please call the Suicide and Crisis Lifeline: 988 call the [place] National Suicide Prevention Lifeline: 916-664-5498 or TTY: (508) 706-0538 TTY 702-325-0452) to talk to a trained counselor call 1-800-273-TALK (toll free, 24 hour hotline)   Patient verbalizes understanding of instructions and care plan provided today and agrees to view in MyChart. Active MyChart status and patient understanding of how to access instructions and care plan via MyChart confirmed with patient.     Telephone follow up appointment with care management team member scheduled for: 10-19-2022 at 1 pm  Alto Denver RN, MSN, CCM RN Care Manager  [facility]  Ambulatory Care Management  Direct Number: [phone]   How to Perform the Epley Maneuver The Epley maneuver is an exercise that relieves symptoms of vertigo. Vertigo is the feeling that you or your surroundings are moving when they are not. When you feel vertigo, you may feel like the room is spinning and may have trouble walking. The Epley maneuver is used for a type of vertigo caused by a calcium deposit in a part of the inner ear. The maneuver involves changing head positions to help the deposit move out of the area. You can do this maneuver at home whenever you have symptoms of vertigo. You can repeat it in 24 hours if your vertigo has not gone away. Even though the Epley maneuver may relieve your vertigo for a few weeks, it is possible that your symptoms will return. This maneuver relieves vertigo, but it does not relieve dizziness. What are the risks? If  it is done correctly, the Epley maneuver is considered safe. Sometimes it can lead to dizziness or nausea that goes away after a short time. If you develop other symptoms--such as changes in vision, weakness, or numbness--stop doing the maneuver and call your health care provider. Supplies needed: A bed or table. A pillow. How to do the Epley maneuver     Sit on the edge of a bed or table with your back straight and your legs extended or hanging over the edge of the bed or table. Turn your head halfway toward the affected ear or side as told by your health care provider. Lie backward quickly with your head turned until you are lying flat on your back. Your head should dangle (head-hanging position). You may want to position a pillow under your shoulders. Hold this position for at least 30 seconds. If you feel dizzy or have symptoms of vertigo, continue to hold the position until the symptoms stop. Turn your head to the opposite direction until your unaffected ear is facing down. Your head should continue to dangle. Hold this position for at least 30 seconds. If you feel dizzy or have  symptoms of vertigo, continue to hold the position until the symptoms stop. Turn your whole body to the same side as your head so that you are positioned on your side. Your head will now be nearly facedown and no longer needs to dangle. Hold for at least 30 seconds. If you feel dizzy or have symptoms of vertigo, continue to hold the position until the symptoms stop. Sit back up. You can repeat the maneuver in 24 hours if your vertigo does not go away. Follow these instructions at home: For 24 hours after doing the Epley maneuver: Keep your head in an upright position. When lying down to sleep or rest, keep your head raised (elevated) with two or more pillows. Avoid excessive neck movements. Activity Do not drive or use machinery if you feel dizzy. After doing the Epley maneuver, return to your normal activities  as told by your health care provider. Ask your health care provider what activities are safe for you. General instructions Drink enough fluid to keep your urine pale yellow. Do not drink alcohol. Take over-the-counter and prescription medicines only as told by your health care provider. Keep all follow-up visits. This is important. Preventing vertigo symptoms Ask your health care provider if there is anything you should do at home to prevent vertigo. He or she may recommend that you: Keep your head elevated with two or more pillows while you sleep. Do not sleep on the side of your affected ear. Get up slowly from bed. Avoid sudden movements during the day. Avoid extreme head positions or movement, such as looking up or bending over. Contact a health care provider if: Your vertigo gets worse. You have other symptoms, including: Nausea. Vomiting. Headache. Get help right away if you: Have vision changes. Have a headache or neck pain that is severe or getting worse. Cannot stop vomiting. Have new numbness or weakness in any part of your body. These symptoms may represent a serious problem that is an emergency. Do not wait to see if the symptoms will go away. Get medical help right away. Call your local emergency services (911 in the [place] Do not drive yourself to the hospital. Summary Vertigo is the feeling that you or your surroundings are moving when they are not. The Epley maneuver is an exercise that relieves symptoms of vertigo. If the Epley maneuver is done correctly, it is considered safe. This information is not intended to replace advice given to you by your health care provider. Make sure you discuss any questions you have with your health care provider. Document Revised: 11/28/2019 Document Reviewed: 11/28/2019 Elsevier Patient Education  2024 [employer].

## 2022-09-03 NOTE — Patient Outreach (Signed)
Care Management   Visit Note  09/03/2022 Name: Luke Mckee MRN: [id number] DOB: March 04, 1940  Subjective: Luke Mckee is a 82 y.o. year old male who is a primary care patient of Luke Cords, DO. The Care Management team was consulted for assistance.      Engaged with patient spoke with patient by telephone.    Goals Addressed             This Visit's Progress    RNCM Care Management  Expected Outcome:  Monitor, Self-Manage, and Reduce Symptoms of Hypertension       Current Barriers:  Knowledge Deficits related to effective management of HTN Chronic Disease Management support and education needs related to HTN and heart health BP Readings from Last 3 Encounters:  08/13/22 110/62  08/05/22 97/62  07/29/22 125/78  See chart for more readings  Planned Interventions: Evaluation of current treatment plan related to hypertension self management and patient's adherence to plan as established by provider. The patient is doing good. He has seen the neurologist and will be going to the [place] on 10-11-2022 to have a vestibular assessment. Explained what a vestibular assessment was and the patient and his wife verbalized understanding. The patients wife states that she is monitoring his blood pressures at home and also he is wearing ted hose now and that is helping with his circulation . ;   Provided education to patient re: stroke prevention, s/s of heart attack and stroke; Reviewed prescribed diet Heart Healthy. The patient is compliant with heart healthy diet. The patient is losing weight. He and his wife are taking a supplement called Lean Belly. Feels great and is also going to the [facility] and doing the silver sneakers program. Praised the patient for positive changes.  Reviewed medications with patient and discussed importance of compliance. The patient is compliant with medications. The patient and his wife work with the pharm D on a regular basis. Denies any acute findings today.  Review of the changes from the sertraline to the Lexapro and the patients wife knows how to taper down the dose and the recommendations. She has talked to the pharm D this week also. Has a good understanding and will call for new questions or concerns  Discussed plans with patient for ongoing care management follow up and provided patient with direct contact information for care management team; Advised patient, providing education and rationale, to monitor blood pressure daily and record, calling PCP for findings outside established parameters. Blood pressures are stable. Denies any acute findings related to blood pressures or heart health;  09-02-2022 141/73, p-63 standing  8-22-12024 153/80, p-64 Standing late afternoon   Reviewed scheduled/upcoming provider appointments including: has an appointment for 09-07-2022 for a vestibular assessment, the patient will go to the [place] on 10-11-2022 to have an assessment through the [place]. Education provided.  Advised patient to discuss changes in HTN or heart health with provider; Provided education on prescribed diet heart healthy. Review and education provided;  Discussed complications of poorly controlled blood pressure such as heart disease, stroke, circulatory complications, vision complications, kidney impairment, sexual dysfunction;  Screening for signs and symptoms of depression related to chronic disease state;  Assessed social determinant of health barriers;  The patients family is helping more with the patient needs. Their granddaughter will be going with them to all of their upcoming appointments.  Extensive review of orthostatic hypotension since the patient is having some dizziness. The patient states that his dizziness is better. He has started taking  Meclizine BID. He is changing his position slowly and denies any acute findings today. Will continue to monitor.   Symptom Management: Take medications as prescribed   Attend all scheduled provider  appointments Call pharmacy for medication refills 3-7 days in advance of running out of medications Call provider office for new concerns or questions  call the Suicide and Crisis Lifeline: 988 call the [place] National Suicide Prevention Lifeline: 605-722-3244 or TTY: 7546303047 TTY (916) 700-5244) to talk to a trained counselor call 1-800-273-TALK (toll free, 24 hour hotline) if experiencing a Mental Health or Behavioral Health Crisis  check blood pressure weekly learn about high blood pressure call doctor for signs and symptoms of high blood pressure develop an action plan for high blood pressure keep all doctor appointments take medications for blood pressure exactly as prescribed report new symptoms to your doctor  Follow Up Plan: Telephone follow up appointment with care management team member scheduled for: 10-19-2022 at 100 pm       RNCM Care Management:  Expected Outcome:  Monitor, Self-Manage and Reduce Symptoms of Heart Failure       Current Barriers:  Knowledge Deficits related to effective management of HF Chronic Disease Management support and education needs related to effective management of HF Wt Readings from Last 3 Encounters:  08/13/22 215 lb (97.5 kg)  08/05/22 215 lb (97.5 kg)  07/29/22 216 lb 14.9 oz (98.4 kg)    Planned Interventions: Basic overview and discussion of pathophysiology of Heart Failure reviewed. The patient has been steadily losing weight by the use of a product called "Lean Belly". The patient denies any issues with swelling or edema. The patient states he is doing well and denies any new concerns with heart failure or heart health. Review and patient continues to do well with management of his heart failure.  Provided education on low sodium diet. The patient is compliant with the heart healthy diet.  Reviewed Heart Failure Action Plan in depth. Knows how to effectively manage his heart failure. Assessed need for readable accurate scales in home.  Has a scale. Provided education about placing scale on hard, flat surface Advised patient to weigh each morning after emptying bladder Discussed importance of daily weight and advised patient to weigh and record daily. Weight today was 215 Reviewed role of diuretics in prevention of fluid overload and management of heart failure. Is compliant with medications. Ask the patients wife to discuss maybe lowering his does of Lasix since the patient has lost weight and is not having the edema as he was. He is on 160 mg daily. He is also using compression hose and this is helping Discussed the importance of keeping all appointments with provider. Saw neurologist recently and has several upcoming appointments with specialist. Knows to call the office for changes or new needs.  Provided patient with education about the role of exercise in the management of heart failure Advised patient to discuss changes in fluid balance or new concerns or questions related to heart failure with provider. The patient states he is stable.  The patient is interested in a life alert system for him and his wife. Information provided and gave additional resources for life alert system in the event that his insurance does not offer options.  The patient will get new hearing aides when he goes to the [place] the end of September. They will make sure they fit well and adjust accordingly.   Symptom Management: Take medications as prescribed   Attend all scheduled provider appointments  Call pharmacy for medication refills 3-7 days in advance of running out of medications Perform all self care activities independently  Perform IADL's (shopping, preparing meals, housekeeping, managing finances) independently Call provider office for new concerns or questions  call the Suicide and Crisis Lifeline: 988 call the [place] National Suicide Prevention Lifeline: 956-653-0618 or TTY: (229) 652-4451 TTY 9015793192) to talk to a trained  counselor call 1-800-273-TALK (toll free, 24 hour hotline) if experiencing a Mental Health or Behavioral Health Crisis   Follow Up Plan: Telephone follow up appointment with care management team member scheduled for: 10-19-2022 at 100 pm           Consent to Services:  Patient was given information about care management services, agreed to services, and gave verbal consent to participate.   Plan: Telephone follow up appointment with care management team member scheduled for: 10-19-2022 at 1 pm  Alto Denver RN, MSN, CCM RN Care Manager  [facility]  Ambulatory Care Management  Direct Number: [phone]

## 2022-09-07 ENCOUNTER — Ambulatory Visit: Payer: Medicare PPO | Attending: Physician Assistant

## 2022-09-07 DIAGNOSIS — R2689 Other abnormalities of gait and mobility: Secondary | ICD-10-CM | POA: Diagnosis not present

## 2022-09-07 DIAGNOSIS — R296 Repeated falls: Secondary | ICD-10-CM | POA: Diagnosis not present

## 2022-09-07 DIAGNOSIS — R42 Dizziness and giddiness: Secondary | ICD-10-CM | POA: Insufficient documentation

## 2022-09-07 DIAGNOSIS — R2681 Unsteadiness on feet: Secondary | ICD-10-CM | POA: Diagnosis not present

## 2022-09-07 NOTE — Therapy (Signed)
OUTPATIENT PHYSICAL THERAPY VESTIBULAR EVALUATION     Patient Name: Luke Mckee MRN: [id number] DOB:Aug 17, 1940, 82 y.o., male Today's Date: 09/07/2022  END OF SESSION:  PT End of Session - 09/07/22 0934     Visit Number 1    Number of Visits 9    Date for PT Re-Evaluation 11/05/22    Authorization Type [employer]    Progress Note Due on Visit 10    PT Start Time [phone]    PT Stop Time 1017    PT Time Calculation (min) 43 min    Equipment Utilized During Treatment Gait belt    Activity Tolerance Patient tolerated treatment well    Behavior During Therapy WFL for tasks assessed/performed             Past Medical History:  Diagnosis Date   Anxiety    Arthritis    CAD (coronary artery disease) 2005   s/p CABG, DES   CHF (congestive heart failure) (HCC)    in EPIC care everywhere   Chronic kidney disease    COPD (chronic obstructive pulmonary disease) (HCC)    in EPIC care everywhere   Depression    Heart murmur    History of stroke    Hypothyroidism    OSA (obstructive sleep apnea)    Paroxysmal A-fib (HCC)    in EPIC careeverywhere   Past Surgical History:  Procedure Laterality Date   APPENDECTOMY     BUNIONECTOMY     CATARACT EXTRACTION     Right eye   CATARACT EXTRACTION W/PHACO Left 01/19/2021   Procedure: CATARACT EXTRACTION PHACO AND INTRAOCULAR LENS PLACEMENT (IOC) LEFT 3.09 00:28.3;  Surgeon: Nevada Crane, MD;  Location: [facility];  Service: Ophthalmology;  Laterality: Left;   COLONOSCOPY  07/06/2004   CORONARY ARTERY BYPASS GRAFT  2005   LOOP RECORDER INSERTION N/A 07/25/2018   Procedure: LOOP RECORDER INSERTION;  Surgeon: Regan Lemming, MD;  Location: [facility];  Service: Cardiovascular;  Laterality: N/A;   TOTAL HIP ARTHROPLASTY Right 11/23/2011   TOTAL HIP ARTHROPLASTY Left 09/07/2011   TOTAL KNEE ARTHROPLASTY Right 02/15/2018   VASECTOMY  1978   Patient Active Problem List   Diagnosis Date Noted   Dizziness on  standing 08/16/2022   Impaired fasting glucose 09/24/2021   Depression 09/24/2021   Sensorineural hearing loss, bilateral 09/24/2021   Hypothyroid 09/24/2021   Hyperlipidemia 09/24/2021   Sleep apnea 09/24/2021   Hallux limitus, acquired, right 11/12/2019   Pre-diabetes 10/17/2019   Recurrent major depressive disorder, in partial remission (HCC) 04/16/2019   Obesity (BMI 30.0-34.9) 04/16/2019   Vasomotor rhinitis 10/25/2018   Hearing loss 08/28/2018   Osteoarthritis 08/28/2018   Tinnitus 08/28/2018   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 (HCC) 07/31/2018   Slurred speech 07/23/2018   Right arm weakness 07/23/2018   Hypokalemia 07/23/2018   Anxiety 07/20/2018   Atherosclerosis of native coronary artery of native heart with stable angina pectoris (HCC) 06/16/2018   Hx of CABG 06/16/2018   Mixed hyperlipidemia 06/16/2018   Benign essential HTN 06/16/2018   History of cerebrovascular accident (CVA) with residual deficit 06/07/2018   Systolic CHF (HCC) 02/15/2018   Hypertension 02/15/2018   S/P total knee arthroplasty, right 02/15/2018   Primary osteoarthritis of both knees 11/25/2017   CAD (coronary artery disease) 11/12/2016   Abnormal nuclear stress test 06/30/2016   Hallux limitus of right foot 10/22/2015   Anesthesia complication 10/10/2015   GERD (gastroesophageal reflux disease) 10/10/2015  Hypothyroidism 10/10/2015   Coronary artery disease involving native coronary artery of native heart with angina pectoris (HCC) 10/10/2015   OSA on CPAP 01/10/2007   Diagnosis unknown 01/12/2003    PCP: [name], DO REFERRING PROVIDER: Mecum, Oswaldo Conroy, PA-C  REFERRING DIAG: R42 (ICD-10-CM) - Dizziness on standing  THERAPY DIAG:  Dizziness and giddiness  Repeated falls  Unsteadiness on feet  Other abnormalities of gait and mobility  ONSET DATE: Referral Date: 08/13/2022 (Acute on Chronic Dizziness)  Rationale for Evaluation and  Treatment: Rehabilitation  SUBJECTIVE:   SUBJECTIVE STATEMENT: Patient reports the dizziness started approx 3 weeks. Wife reports it has been going on longer than that. Has not saw ENT. Reports feels dizzy when he stands up to fast, and bends forward. Endorses it as funny headed feeling and lightheaded. Does have intermittent blurry vision. Denies nausea/vomiting. Does have tinnitus. Denies headaches/migraines. Denies infection/sickness.  Pt accompanied by:  Spouse and Granddaughter  PERTINENT HISTORY: Anxiety, Arthritis, CAD, CHF, CKD, COPD, Depression, Hx of CVA, Hypothyroidism, OSA, Paroxysmal A-Fib   PAIN:  Are you having pain? No  PRECAUTIONS: None  RED FLAGS: None   WEIGHT BEARING RESTRICTIONS: No  FALLS: Has patient fallen in last 6 months? Yes. Number of falls 6  LIVING ENVIRONMENT: Lives with: lives with their spouse Lives in: House/apartment Stairs:  3 steps to enter Has following equipment at home: Single point cane  PLOF: Independent with household mobility with device and Independent with community mobility with device  PATIENT GOALS: stop falling  OBJECTIVE:   DIAGNOSTIC FINDINGS: 07/29/22 MRI BRAIN WO IMPRESSION:  1. No acute intracranial abnormality.  2. Age-related cerebral atrophy with moderately advanced chronic  microvascular ischemic disease, with a few scattered remote lacunar  infarcts about the bilateral basal ganglia and thalami.  3. Scattered small volume encephalomalacia involving the peripheral  left frontal and temporal lobes, most likely reflecting changes of  remote traumatic brain injury.   COGNITION: Overall cognitive status: Within functional limits for tasks assessed   SENSATION: WFL  STRENGTH: generalized weakness (RLE >LLE; Hx of CVA)   LOWER EXTREMITY MMT:   MMT Right eval Left eval  Hip flexion    Hip abduction    Hip adduction    Hip internal rotation    Hip external rotation    Knee flexion    Knee extension     Ankle dorsiflexion    Ankle plantarflexion    Ankle inversion    Ankle eversion    (Blank rows = not tested)  BED MOBILITY:  Sit to supine SBA Supine to sit SBA  TRANSFERS: Assistive device utilized: None  Sit to stand: CGA Stand to sit: CGA Chair to chair: CGA  GAIT: Comments: Not assessed this date; will further assess in future  FUNCTIONAL TESTS:  M-CSTIB: Situation 1: 30 seconds (mild postural sway), situation 2: 15 seconds (increased postural sway)   PATIENT SURVEYS:  FOTO will be assessed on next visit; unable this date due to time constraints  VESTIBULAR ASSESSMENT:   SYMPTOM BEHAVIOR:  Subjective history: See Subjective  Non-Vestibular symptoms: tinnitus  Type of dizziness: Blurred Vision, Imbalance (Disequilibrium), Lightheadedness/Faint, and "Funny feeling in the head"  Frequency: daily; occurs intermittent  Duration: seconds to minute  Aggravating factors: Induced by position change: supine to sit and sit to stand  Relieving factors: lying supine  Progression of symptoms: unchanged  OCULOMOTOR EXAM:  Ocular Alignment: normal  Ocular ROM: No Limitations  Spontaneous Nystagmus: absent  Gaze-Induced Nystagmus: absent  Smooth  Pursuits: saccades  Saccades: hypometric/undershoots and slow  VESTIBULAR - OCULAR REFLEX:   Slow VOR: Normal  VOR Cancellation: Comment: able to maintain gaze, moderate  Head-Impulse Test: HIT Right: negative HIT Left: negative  Dynamic Visual Acuity:  not warranted this date   POSITIONAL TESTING: Right Roll Test: no nystagmus Left Roll Test: no nystagmus  MOTION SENSITIVITY:  Motion Sensitivity Quotient Intensity: 0 = none, 1 = Lightheaded, 2 = Mild, 3 = Moderate, 4 = Severe, 5 = Vomiting  Intensity  1. Sitting to supine 0  2. Supine to L side 0  3. Supine to R side 0  4. Supine to sitting 1  5. L Hallpike-Dix -  6. Up from L  -  7. R Hallpike-Dix -  8. Up from R  -  9. Sitting, head tipped to L knee 0  10. Head up  from L knee 1  11. Sitting, head tipped to R knee 0  12. Head up from R knee 1  13. Sitting head turns x5 2  14.Sitting head nods x5 2  15. In stance, 180 turn to L  0  16. In stance, 180 turn to R 0    OTHOSTATICS: Seated: 113/74, HR: 70 Standing: 114/67 (patient symptomatic with lightheadedness). Encouraged continued assessment of BP readings at home after been in position for extended period of time.    PATIENT EDUCATION: Education details: Educated on [employer] Person educated: Patient and Spouse Education method: Explanation Education comprehension: verbalized understanding  HOME EXERCISE PROGRAM:  GOALS: Goals reviewed with patient? Yes  SHORT TERM GOALS: Target date: 10/08/2022  Pt will be independent with initial HEP for improved balance and vestibular input  Baseline: no HEP etablished Goal status: INITIAL  2.  Pt will be able to hold situation 2 of M-CTSIB for >/= 25 seconds Baseline: 15 seconds Goal status: INITIAL  3.  Pt will be able to bend over to pick up object independent with </= 5/10 dizziness Baseline: currently unable to do Goal status: INITIAL   LONG TERM GOALS: Target date: 11/05/2022  FOTO LTG to be set as applicable Baseline: TBA Goal status: INITIAL  2.  Pt will report </= 1/5 for all movements on MSQ to indicate improvement in motion sensitivity and improved activity tolerance.  Baseline: 2/5 Goal status: INITIAL  3.  Pt will be abe to ambulate >/= 100 ft with head turns and 90 deg body turns with </= 5/10 dizziness Baseline: unable without assist and significant dizziness Goal status: INITIAL  4.  Pt will be independent with final HEP for improved balance and vestibular input  Baseline: no HEP established  Goal status: INITIAL  ASSESSMENT:  CLINICAL IMPRESSION: Patient is a 82 y.o. male referred to OPPT services for Dizziness. [name] PMH significant for the following: Anxiety, Arthritis, CAD, CHF, CKD, COPD, Depression,  Hx of CVA, Hypothyroidism, OSA, Paroxysmal A-Fib  . Upon evaluation, patient presents with the following impairments: dizziness, increased motion sensitivity, abnormal oculomotor exam with symptomatic saccades, impaired balance, recurrent falls and increased risk of falls due to balance/gait impairment. Pt will benefit from further balance assessment at next session. Patient will benefit from skilled PT services to address impairments, improve dizziness, and further reduce fall risk.    OBJECTIVE IMPAIRMENTS: Abnormal gait, decreased activity tolerance, decreased balance, decreased mobility, difficulty walking, decreased strength, decreased safety awareness, and dizziness.   ACTIVITY LIMITATIONS: bending, sitting, standing, transfers, bed mobility, and reach over head  PARTICIPATION LIMITATIONS: driving, community activity, and yard work  PERSONAL FACTORS: Age, Time since onset of injury/illness/exacerbation, and 3+ comorbidities: Anxiety, Arthritis, CAD, CHF, CKD, COPD, Depression, Hx of CVA, Hypothyroidism, OSA, Paroxysmal A-Fib   are also affecting patient's functional outcome.   REHAB POTENTIAL: Good  CLINICAL DECISION MAKING: Evolving/moderate complexity  EVALUATION COMPLEXITY: Moderate   PLAN:  PT FREQUENCY: 1x/week  PT DURATION: 8 weeks  PLANNED INTERVENTIONS: Therapeutic exercises, Therapeutic activity, Neuromuscular re-education, Balance training, Gait training, Patient/Family education, Self Care, Joint mobilization, Stair training, Vestibular training, Canalith repositioning, DME instructions, Cryotherapy, Moist heat, Manual therapy, and Re-evaluation  PLAN FOR NEXT SESSION: assess gait speed, TUG. Establish Balance HEP. Monitor BP.   Howie Ill, PT, DPT 09/07/22 11:32 AM

## 2022-09-10 DIAGNOSIS — G4733 Obstructive sleep apnea (adult) (pediatric): Secondary | ICD-10-CM | POA: Diagnosis not present

## 2022-09-15 ENCOUNTER — Other Ambulatory Visit: Payer: Self-pay | Admitting: Family Medicine

## 2022-09-15 DIAGNOSIS — R42 Dizziness and giddiness: Secondary | ICD-10-CM

## 2022-09-15 NOTE — Telephone Encounter (Signed)
Medication Refill - Medication: meclizine (ANTIVERT) 12.5 MG tablet   Has the patient contacted their pharmacy? YES (Agent: If no, request that the patient contact the pharmacy for the refill. If patient does not wish to contact the pharmacy document the reason why and proceed with request.) (Agent: If yes, when and what did the pharmacy advise?)contact pcp  Preferred Pharmacy (with phone number or street name): [id number] [place] - [address] AT [facility] [address] [place] [address] Phone: [phone]  Fax: [phone]   Has the patient been seen for an appointment in the last year OR does the patient have an upcoming appointment? yes  Agent: Please be advised that RX refills may take up to 3 business days. We ask that you follow-up with your pharmacy.

## 2022-09-16 MED ORDER — MECLIZINE HCL 12.5 MG PO TABS
12.5000 mg | ORAL_TABLET | Freq: Three times a day (TID) | ORAL | 1 refills | Status: AC | PRN
Start: 2022-09-16 — End: ?

## 2022-09-16 NOTE — Telephone Encounter (Signed)
Requested medication (s) are due for refill today: yes  Requested medication (s) are on the active medication list: yes  Last refill:  08/13/22 #20  Future visit scheduled:no  Notes to clinic:  med not delegated to NT to RF   Requested Prescriptions  Pending Prescriptions Disp Refills   meclizine (ANTIVERT) 12.5 MG tablet 20 tablet 0    Sig: Take 1 tablet (12.5 mg total) by mouth 3 (three) times daily as needed for dizziness.     Not Delegated - Gastroenterology: Antiemetics Failed - 09/15/2022 12:15 PM      Failed - This refill cannot be delegated      Passed - Valid encounter within last 6 months    Recent Outpatient Visits           2 weeks ago Psychophysiologic insomnia   [facility] Delles, Gentry Fitz A, RPH-CPP   1 month ago Dizziness on standing   [facility] [place] Mecum, Oswaldo Conroy, [place]   2 months ago Essential hypertension   [facility] [name], DO   8 months ago Annual physical exam   [facility] [name], DO   9 months ago OSA on CPAP   [facility] [name], DO       Future Appointments             In 1 month Gollan, Tollie Pizza, MD [facility]   In 3 months [name], Netta [name], DO  [facility]

## 2022-09-21 ENCOUNTER — Other Ambulatory Visit: Payer: Self-pay

## 2022-09-21 NOTE — Patient Outreach (Signed)
Care Management   Visit Note  09/21/2022 Name: Luke Mckee MRN: [id number] DOB: 09-15-1940  Subjective: Luke Mckee is a 82 y.o. year old male who is a primary care patient of [name], DO. The Care Management team was consulted for assistance.      Engaged with patient spoke with the family member (POA, [place], [place]).    Goals Addressed             This Visit's Progress    RNCM Care Management  Expected Outcome:  Monitor, Self-Manage, and Reduce Symptoms of Hypertension       Current Barriers:  Knowledge Deficits related to effective management of HTN Chronic Disease Management support and education needs related to HTN and heart health BP Readings from Last 3 Encounters:  08/13/22 110/62  08/05/22 97/62  07/29/22 125/78  See chart for more readings  Planned Interventions: Evaluation of current treatment plan related to hypertension self management and patient's adherence to plan as established by provider. The patient is doing good. He has seen the neurologist and will be going to the [place] on 10-11-2022 to have a vestibular assessment. Explained what a vestibular assessment was and the patient and his wife verbalized understanding. The patients wife states that she is monitoring his blood pressures at home and also he is wearing ted hose now and that is helping with his circulation . ;   Provided education to patient re: stroke prevention, s/s of heart attack and stroke; Reviewed prescribed diet Heart Healthy. The patient is compliant with heart healthy diet. The patient is losing weight. He and his wife are taking a supplement called Lean Belly. Feels great and is also going to the [facility] and doing the silver sneakers program. Praised the patient for positive changes.  Reviewed medications with patient and discussed importance of compliance. The patient is compliant with medications. The patient and his wife work with the pharm D on a regular basis. Denies any acute  findings today. Review of the changes from the sertraline to the Lexapro and the patients wife knows how to taper down the dose and the recommendations. She has talked to the pharm D this week also. Has a good understanding and will call for new questions or concerns  Discussed plans with patient for ongoing care management follow up and provided patient with direct contact information for care management team; Advised patient, providing education and rationale, to monitor blood pressure daily and record, calling PCP for findings outside established parameters. Blood pressures are stable. Denies any acute findings related to blood pressures or heart health;  09-12-2022 167/100, p-88 standing  09-12-2022 116/71, p-85 sitting   09-12-2022 107/53, p77 laying   Reviewed scheduled/upcoming provider appointments including: has an appointment for 09-07-2022 for a vestibular assessment, the patient will go to the [place] on 10-11-2022 to have an assessment through the [place]. Education provided.  Advised patient to discuss changes in HTN or heart health with provider; Provided education on prescribed diet heart healthy. Review and education provided;  Discussed complications of poorly controlled blood pressure such as heart disease, stroke, circulatory complications, vision complications, kidney impairment, sexual dysfunction;  Screening for signs and symptoms of depression related to chronic disease state;  Assessed social determinant of health barriers;  The patients family is helping more with the patient needs. Their granddaughter will be going with them to all of their upcoming appointments.  Extensive review of orthostatic hypotension since the patient is having some dizziness. The patient states that his  dizziness is better. He has started taking Meclizine BID. He is changing his position slowly and denies any acute findings today. Will continue to monitor.  Incoming call from the patients wife. The patient has been  sleeping a lot the last 2 days and told his wife that he was feeling "off" earlier today. The wife is concerned because he is not acting his usual self. She states that he is eating and drinking but not like he usually does. States his blood pressures have been up and down. The patients wife states the patient has transitioned completely off of the zoloft now and is on the lexapro. Is taking Meclizine BID, and trazodone nightly. Collaboration with the pcp and advised the patients wife after collaboration with the pcp for the patient to stop taking the Lexapro since the taper of zoloft had been done and pause the SSRI to see if this is what may be causing the change in mentation.  Call made back to the patients wife. She states the patient is now fully awake and states he is feeling much better. Review of the recommendations of the pcp. The patient and patients wife want to continue with the current regimen and see if there are any other changes. He has follow ups in place for vestibular assessments and follow up with the pharm D. Advised the wife if he had changes in his mentation, running a fever, smelly urine, or other sx and sx of infection to take the patient to be checked out. The patients wife verbalized understanding. Will continue to monitor.   Symptom Management: Take medications as prescribed   Attend all scheduled provider appointments Call pharmacy for medication refills 3-7 days in advance of running out of medications Call provider office for new concerns or questions  call the Suicide and Crisis Lifeline: 988 call the [place] National Suicide Prevention Lifeline: (540)865-1454 or TTY: (828)417-8540 TTY 563-341-5995) to talk to a trained counselor call 1-800-273-TALK (toll free, 24 hour hotline) if experiencing a Mental Health or Behavioral Health Crisis  check blood pressure weekly learn about high blood pressure call doctor for signs and symptoms of high blood pressure develop an action  plan for high blood pressure keep all doctor appointments take medications for blood pressure exactly as prescribed report new symptoms to your doctor  Follow Up Plan: Telephone follow up appointment with care management team member scheduled for: 10-19-2022 at 100 pm            Consent to Services:  Patient was given information about care management services, agreed to services, and gave verbal consent to participate.   Plan: Telephone follow up appointment with care management team member scheduled for: 10-19-2022 at 1 pm  Alto Denver RN, MSN, CCM RN Care Manager  [facility]  Ambulatory Care Management  Direct Number: [phone]

## 2022-09-21 NOTE — Patient Instructions (Signed)
Visit Information  Thank you for taking time to visit with me today. Please don't hesitate to contact me if I can be of assistance to you before our next scheduled telephone appointment.  Following are the goals we discussed today:   Goals Addressed             This Visit's Progress    RNCM Care Management  Expected Outcome:  Monitor, Self-Manage, and Reduce Symptoms of Hypertension       Current Barriers:  Knowledge Deficits related to effective management of HTN Chronic Disease Management support and education needs related to HTN and heart health BP Readings from Last 3 Encounters:  08/13/22 110/62  08/05/22 97/62  07/29/22 125/78  See chart for more readings  Planned Interventions: Evaluation of current treatment plan related to hypertension self management and patient's adherence to plan as established by provider. The patient is doing good. He has seen the neurologist and will be going to the [place] on 10-11-2022 to have a vestibular assessment. Explained what a vestibular assessment was and the patient and his wife verbalized understanding. The patients wife states that she is monitoring his blood pressures at home and also he is wearing ted hose now and that is helping with his circulation . ;   Provided education to patient re: stroke prevention, s/s of heart attack and stroke; Reviewed prescribed diet Heart Healthy. The patient is compliant with heart healthy diet. The patient is losing weight. He and his wife are taking a supplement called Lean Belly. Feels great and is also going to the [facility] and doing the silver sneakers program. Praised the patient for positive changes.  Reviewed medications with patient and discussed importance of compliance. The patient is compliant with medications. The patient and his wife work with the pharm D on a regular basis. Denies any acute findings today. Review of the changes from the sertraline to the Lexapro and the patients wife knows how to taper  down the dose and the recommendations. She has talked to the pharm D this week also. Has a good understanding and will call for new questions or concerns  Discussed plans with patient for ongoing care management follow up and provided patient with direct contact information for care management team; Advised patient, providing education and rationale, to monitor blood pressure daily and record, calling PCP for findings outside established parameters. Blood pressures are stable. Denies any acute findings related to blood pressures or heart health;  09-12-2022 167/100, p-88 standing  09-12-2022 116/71, p-85 sitting   09-12-2022 107/53, p77 laying   Reviewed scheduled/upcoming provider appointments including: has an appointment for 09-07-2022 for a vestibular assessment, the patient will go to the [place] on 10-11-2022 to have an assessment through the [place]. Education provided.  Advised patient to discuss changes in HTN or heart health with provider; Provided education on prescribed diet heart healthy. Review and education provided;  Discussed complications of poorly controlled blood pressure such as heart disease, stroke, circulatory complications, vision complications, kidney impairment, sexual dysfunction;  Screening for signs and symptoms of depression related to chronic disease state;  Assessed social determinant of health barriers;  The patients family is helping more with the patient needs. Their granddaughter will be going with them to all of their upcoming appointments.  Extensive review of orthostatic hypotension since the patient is having some dizziness. The patient states that his dizziness is better. He has started taking Meclizine BID. He is changing his position slowly and denies any acute findings today.  Will continue to monitor.  Incoming call from the patients wife. The patient has been sleeping a lot the last 2 days and told his wife that he was feeling "off" earlier today. The wife is concerned  because he is not acting his usual self. She states that he is eating and drinking but not like he usually does. States his blood pressures have been up and down. The patients wife states the patient has transitioned completely off of the zoloft now and is on the lexapro. Is taking Meclizine BID, and trazodone nightly. Collaboration with the pcp and advised the patients wife after collaboration with the pcp for the patient to stop taking the Lexapro since the taper of zoloft had been done and pause the SSRI to see if this is what may be causing the change in mentation.  Call made back to the patients wife. She states the patient is now fully awake and states he is feeling much better. Review of the recommendations of the pcp. The patient and patients wife want to continue with the current regimen and see if there are any other changes. He has follow ups in place for vestibular assessments and follow up with the pharm D. Advised the wife if he had changes in his mentation, running a fever, smelly urine, or other sx and sx of infection to take the patient to be checked out. The patients wife verbalized understanding. Will continue to monitor.   Symptom Management: Take medications as prescribed   Attend all scheduled provider appointments Call pharmacy for medication refills 3-7 days in advance of running out of medications Call provider office for new concerns or questions  call the Suicide and Crisis Lifeline: 988 call the [place] National Suicide Prevention Lifeline: (413) 182-8251 or TTY: 7818842864 TTY 629-751-9927) to talk to a trained counselor call 1-800-273-TALK (toll free, 24 hour hotline) if experiencing a Mental Health or Behavioral Health Crisis  check blood pressure weekly learn about high blood pressure call doctor for signs and symptoms of high blood pressure develop an action plan for high blood pressure keep all doctor appointments take medications for blood pressure exactly as  prescribed report new symptoms to your doctor  Follow Up Plan: Telephone follow up appointment with care management team member scheduled for: 10-19-2022 at 100 pm           Our next appointment is by telephone on 10-19-2022 at 1 pm  Please call the care guide team at [phone] if you need to cancel or reschedule your appointment.   If you are experiencing a Mental Health or Behavioral Health Crisis or need someone to talk to, please call the Suicide and Crisis Lifeline: 988 call the [place] National Suicide Prevention Lifeline: (947)524-1325 or TTY: (236) 474-8058 TTY 939-155-1812) to talk to a trained counselor call 1-800-273-TALK (toll free, 24 hour hotline)   Patient verbalizes understanding of instructions and care plan provided today and agrees to view in MyChart. Active MyChart status and patient understanding of how to access instructions and care plan via MyChart confirmed with patient.     Telephone follow up appointment with care management team member scheduled for: 10-19-2022 at 1 pm  Alto Denver RN, MSN, CCM RN Care Manager  [facility]  Ambulatory Care Management  Direct Number: [phone]

## 2022-09-27 ENCOUNTER — Telehealth: Payer: Self-pay | Admitting: Pharmacist

## 2022-09-27 ENCOUNTER — Ambulatory Visit: Payer: Medicare PPO | Admitting: Pharmacist

## 2022-09-27 DIAGNOSIS — I10 Essential (primary) hypertension: Secondary | ICD-10-CM

## 2022-09-27 DIAGNOSIS — I25810 Atherosclerosis of coronary artery bypass graft(s) without angina pectoris: Secondary | ICD-10-CM

## 2022-09-27 DIAGNOSIS — R0600 Dyspnea, unspecified: Secondary | ICD-10-CM

## 2022-09-27 DIAGNOSIS — J309 Allergic rhinitis, unspecified: Secondary | ICD-10-CM

## 2022-09-27 MED ORDER — NITROGLYCERIN 0.4 MG SL SUBL: 0.4000 mg | SUBLINGUAL_TABLET | SUBLINGUAL | 2 refills | Status: DC | PRN

## 2022-09-27 MED ORDER — IPRATROPIUM BROMIDE 0.06 % NA SOLN: 2.0000 | Freq: Four times a day (QID) | NASAL | 2 refills | Status: AC | PRN

## 2022-09-27 MED ORDER — ALBUTEROL SULFATE HFA 108 (90 BASE) MCG/ACT IN AERS
1.0000 | INHALATION_SPRAY | RESPIRATORY_TRACT | 3 refills | Status: DC | PRN
Start: 2022-09-27 — End: 2023-09-29

## 2022-09-27 NOTE — Telephone Encounter (Signed)
[name] wife requesting renewal of albuterol, ipratropium and Nitrostat prescriptions.  Would you please send these to [facility] for him?  Thank you!

## 2022-09-27 NOTE — Patient Instructions (Signed)
Goals Addressed             This Visit's Progress    Pharmacy Goals       It was great talking with you today!  Check your blood pressure twice weekly, and any time you have concerning symptoms like headache, chest pain, dizziness, shortness of breath, or vision changes.   To appropriately check your blood pressure, make sure you do the following:  1) Avoid caffeine, exercise, or tobacco products for 30 minutes before checking. Empty your bladder. 2) Sit with your back supported in a flat-backed chair. Rest your arm on something flat (arm of the chair, table, etc). 3) Sit still with your feet flat on the floor, resting, for at least 5 minutes.  4) Check your blood pressure. Take 1-2 readings.  5) Write down these readings and bring with you to any provider appointments.  Bring your home blood pressure machine with you to a provider's office for accuracy comparison at least once a year.   Make sure you take your blood pressure medications before you come to any office visit, even if you were asked to fast for labs.   Our goal bad cholesterol, or LDL, is less than 70 . This is why it is important to continue taking your atorvastatin  Feel free to call me with any questions or concerns. I look forward to our next call!   Estelle Grumbles, PharmD, [facility] Clinical Pharmacist [facility] [phone]

## 2022-09-27 NOTE — Progress Notes (Signed)
This encounter was created in error - please disregard.

## 2022-09-27 NOTE — Progress Notes (Signed)
09/27/2022 Name: Daziel Kilday MRN: [id number] DOB: [birth year]  Chief Complaint  Patient presents with   Medication Management    Loic Comolli is a 82 y.o. year old male was referred to the pharmacist by their PCP for assistance in managing complex medication management.   Speak with patient and wife by telephone today  Subjective:  Care Team: Primary Care Provider: [name], DO ; Next Scheduled Visit: 12/21/2022 Cardiologist: [name], MD; Next Scheduled Visit: 11/08/2022 Neurologist: Gelene Mink, PA; Next Scheduled Visit: 11/30/2022 Physical Therapist: Howie Ill, PT; Next Scheduled Appointment: 09/30/2022 Nurse Care Manager: Carma Lair, RN; Next Schedule Appointment: 10/19/2022  Medication Access/Adherence  Current Pharmacy:  [facility] [phone] - [address] [place], [place] - [address] AT [facility] [address] [place] [address] Phone: [phone] Fax: [phone]  [facility] - [place] - [address] [place] [address] Phone: [phone] Fax: [phone]   Patient reports affordability concerns with their medications: No  Patient reports access/transportation concerns to their pharmacy: No  Patient reports adherence concerns with their medications:  No    [name] wife reports patient completed transition from Zoloft to Lexapro as recommended by Neurologist and feels like patient's depression controlled better now on Lexapro 20 mg daily  Reports patient staying hydrated  Hypertension:  Current medications:  - atenolol 50 mg - 1/2 tablet (25 mg) daily - furosemide 80 mg twice daily  Confirms patient no longer taking spironolactone as this was discontinued by Cardiologist on 08/05/2022  Patient has a validated, automated, upper arm home BP cuff Current blood pressure readings readings: Today: 110/73, HR 75  Reports taking  positional changes slowly to avoid dizziness, but also has meclizine to use if needed for dizziness  Current Physical Activity: Reports starting to see Physical Therapy through [facility]   Objective:  Lab Results  Component Value Date   HGBA1C 6.2 (H) 12/22/2021    Lab Results  Component Value Date   CREATININE 1.07 07/29/2022   BUN 20 07/29/2022   NA 135 07/29/2022   K 3.8 07/29/2022   CL 100 07/29/2022   CO2 27 07/29/2022    Lab Results  Component Value Date   CHOL 127 12/22/2021   HDL 42 12/22/2021   LDLCALC 62 12/22/2021   TRIG 143 12/22/2021   CHOLHDL 3.0 12/22/2021   BP Readings from Last 3 Encounters:  08/13/22 110/62  08/05/22 97/62  07/29/22 125/78   Pulse Readings from Last 3 Encounters:  08/13/22 67  08/05/22 71  07/29/22 62    Medications Reviewed Today     Reviewed by Manuela Neptune, RPH-CPP (Pharmacist) on 09/27/22 at 1514  Med List Status: <None>   Medication Order Taking? Sig Documenting Provider Last Dose Status Informant  acetaminophen (TYLENOL) 650 MG CR tablet [id number]  Take 1,300 mg by mouth at bedtime as needed. [provider]  Active Spouse/Significant Other  albuterol (VENTOLIN HFA) 108 (90 Base) MCG/ACT inhaler [id number]  Inhale 1-2 puffs into the lungs every 4 (four) hours as needed for wheezing or shortness of breath (cough). [name], DO  Active   aspirin EC 81 MG tablet [id number] Yes Take 81 mg by mouth daily. [provider] Taking Active   atenolol (TENORMIN) 50 MG tablet [id number] Yes Take 1 tablet (50 mg total) by mouth daily.  Patient taking differently: Take 25  mg by mouth daily.   [name], DO Taking Active   atorvastatin (LIPITOR) 80 MG tablet [id number] Yes Take 1 tablet (80 mg total) by mouth daily. [name], DO Taking Active   Calcium Carb-Cholecalciferol (CALCIUM 600 + D PO) [id number]  Take 1 tablet by mouth daily. [provider]  Active    Cholecalciferol (VITAMIN D) 50 MCG (2000 UT) tablet [id number]  Take 2,000 Units by mouth daily. [provider]  Active Spouse/Significant Other  clopidogrel (PLAVIX) 75 MG tablet [id number]  Take 1 tablet (75 mg total) by mouth daily. Karamalegos, Netta [name], DO  Active   clotrimazole-betamethasone (LOTRISONE) cream [id number]  Apply twice a day for 1-2 weeks, may repeat if need in future [name], DO  Active   co-enzyme Q-10 30 MG capsule [id number]  Take 100 mg by mouth daily. [provider]  Active   colchicine 0.6 MG tablet [id number]  For acute gout flare take 2 pills at once, then take a 3rd pill 2 hours later. Then take 1 pill daily for 7-10 days or until resolved. [name], DO  Active   diclofenac sodium (VOLTAREN) 1 % GEL [id number]  Apply 2 g topically 4 (four) times daily as needed for pain. Foot pain [provider]  Active Spouse/Significant Other  escitalopram (LEXAPRO) 10 MG tablet [id number] Yes Take 20 mg by mouth daily. Has transitioned off of the Zoloft and is now taking Lexapro 20 mg daily [provider] Taking Active   furosemide (LASIX) 80 MG tablet [id number]  TAKE 1 TABLET(80 MG) BY MOUTH TWICE DAILY Karamalegos, Netta [name], DO  Active   ipratropium (ATROVENT) 0.06 % nasal spray [id number]  Place 2 sprays into both nostrils 4 (four) times daily as needed for rhinitis. [name], DO  Active   levothyroxine (SYNTHROID) 125 MCG tablet [id number]  TAKE 1 TABLET(125 MCG) BY MOUTH DAILY BEFORE BREAKFAST Karamalegos, Alexander Shela Commons, DO  Active   loratadine (CLARITIN) 10 MG tablet [id number]  Take 10 mg by mouth daily. [provider]  Active   LORazepam (ATIVAN) 0.5 MG tablet [id number]  Take 1 tablet (0.5 mg total) by mouth daily as needed for anxiety or sleep. Karamalegos, Netta [name], DO  Active   meclizine (ANTIVERT) 12.5 MG tablet [id number]  Take 1 tablet (12.5 mg total) by mouth 3 (three) times  daily as needed for dizziness. Karamalegos, Netta [name], DO  Active   montelukast (SINGULAIR) 10 MG tablet [id number]  Take 1 tablet (10 mg total) by mouth at bedtime. [name], DO  Active   Multiple Vitamin (MULTIVITAMIN) tablet [id number]  Take 1 tablet by mouth daily. [provider]  Active Spouse/Significant Other  nitroGLYCERIN (NITROSTAT) 0.4 MG SL tablet [id number]  Place 1 tablet (0.4 mg total) under the tongue every 5 (five) minutes as needed for chest pain. Karamalegos, Netta [name], DO  Active   PATADAY 0.1 % ophthalmic solution [id number]  Place into both eyes as needed.  [provider]  Active   sennosides-docusate sodium (SENOKOT-S) 8.6-50 MG tablet [id number]  Take 1-2 tablets by mouth daily as needed for constipation. [provider]  Active   traZODone (DESYREL) 50 MG tablet [id number]  Take 1-2 tablets (50-100 mg total) by mouth at bedtime. Mecum, Oswaldo Conroy, PA-C  Active               Assessment/Plan:   Comprehensive medication review performed; medication list updated in electronic medical record - Review that patient  is to take meclizine on as needed, rather than scheduled basis, for dizziness. Caution for risk of sedation with meclizine  [name] wife requests renewals of albuterol, ipratropium and Nitrostat prescriptions for patient  Send request to clinical team  Hypertension: - Reviewed long term cardiovascular and renal outcomes of uncontrolled blood pressure - Remind patient to continue to take positional changes slowly - Recommend to continue to monitor home blood pressure, keep log of results and have this record to review at upcoming medical appointments. Patient to contact provider office sooner if needed for readings outside of established parameters or symptoms   Follow Up Plan: Clinical Pharmacist will follow up with patient by telephone on 12/31/2022 at 11:30 am  Estelle Grumbles, PharmD, [place], CPP Clinical  Pharmacist [facility] [phone]

## 2022-09-30 ENCOUNTER — Ambulatory Visit: Payer: Medicare PPO | Attending: Physician Assistant

## 2022-09-30 DIAGNOSIS — R2689 Other abnormalities of gait and mobility: Secondary | ICD-10-CM | POA: Insufficient documentation

## 2022-09-30 DIAGNOSIS — R2681 Unsteadiness on feet: Secondary | ICD-10-CM | POA: Insufficient documentation

## 2022-09-30 DIAGNOSIS — R296 Repeated falls: Secondary | ICD-10-CM | POA: Insufficient documentation

## 2022-09-30 DIAGNOSIS — R42 Dizziness and giddiness: Secondary | ICD-10-CM | POA: Insufficient documentation

## 2022-09-30 NOTE — Therapy (Signed)
OUTPATIENT PHYSICAL THERAPY VESTIBULAR TREATMENT NOTE   Patient Name: Luke Mckee MRN: [id number] DOB:07-15-1940, 82 y.o., male Today's Date: 09/30/2022  PCP: [name], DO  REFERRING PROVIDER: Providence Crosby, PA-C   END OF SESSION:  PT End of Session - 09/30/22 1105     Visit Number 2    Number of Visits 9    Date for PT Re-Evaluation 11/05/22    Authorization Type [employer]    Progress Note Due on Visit 10    PT Start Time 1102    PT Stop Time 1145    PT Time Calculation (min) 43 min    Equipment Utilized During Treatment Gait belt    Activity Tolerance Patient tolerated treatment well    Behavior During Therapy WFL for tasks assessed/performed             Past Medical History:  Diagnosis Date   Anxiety    Arthritis    CAD (coronary artery disease) 2005   s/p CABG, DES   CHF (congestive heart failure) (HCC)    in EPIC care everywhere   Chronic kidney disease    COPD (chronic obstructive pulmonary disease) (HCC)    in EPIC care everywhere   Depression    Heart murmur    History of stroke    Hypothyroidism    OSA (obstructive sleep apnea)    Paroxysmal A-fib (HCC)    in EPIC careeverywhere   Past Surgical History:  Procedure Laterality Date   APPENDECTOMY     BUNIONECTOMY     CATARACT EXTRACTION     Right eye   CATARACT EXTRACTION W/PHACO Left 01/19/2021   Procedure: CATARACT EXTRACTION PHACO AND INTRAOCULAR LENS PLACEMENT (IOC) LEFT 3.09 00:28.3;  Surgeon: Nevada Crane, MD;  Location: [facility];  Service: Ophthalmology;  Laterality: Left;   COLONOSCOPY  07/06/2004   CORONARY ARTERY BYPASS GRAFT  2005   LOOP RECORDER INSERTION N/A 07/25/2018   Procedure: LOOP RECORDER INSERTION;  Surgeon: Regan Lemming, MD;  Location: [facility];  Service: Cardiovascular;  Laterality: N/A;   TOTAL HIP ARTHROPLASTY Right 11/23/2011   TOTAL HIP ARTHROPLASTY Left 09/07/2011   TOTAL KNEE ARTHROPLASTY Right 02/15/2018    VASECTOMY  1978   Patient Active Problem List   Diagnosis Date Noted   Dizziness on standing 08/16/2022   Impaired fasting glucose 09/24/2021   Depression 09/24/2021   Sensorineural hearing loss, bilateral 09/24/2021   Hypothyroid 09/24/2021   Hyperlipidemia 09/24/2021   Sleep apnea 09/24/2021   Hallux limitus, acquired, right 11/12/2019   Pre-diabetes 10/17/2019   Recurrent major depressive disorder, in partial remission (HCC) 04/16/2019   Obesity (BMI 30.0-34.9) 04/16/2019   Vasomotor rhinitis 10/25/2018   Hearing loss 08/28/2018   Osteoarthritis 08/28/2018   Tinnitus 08/28/2018   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 (HCC) 07/31/2018   Slurred speech 07/23/2018   Right arm weakness 07/23/2018   Hypokalemia 07/23/2018   Anxiety 07/20/2018   Atherosclerosis of native coronary artery of native heart with stable angina pectoris (HCC) 06/16/2018   Hx of CABG 06/16/2018   Mixed hyperlipidemia 06/16/2018   Benign essential HTN 06/16/2018   History of cerebrovascular accident (CVA) with residual deficit 06/07/2018   Systolic CHF (HCC) 02/15/2018   Hypertension 02/15/2018   S/P total knee arthroplasty, right 02/15/2018   Primary osteoarthritis of both knees 11/25/2017   CAD (coronary artery disease) 11/12/2016   Abnormal nuclear stress test 06/30/2016   Hallux limitus of right foot 10/22/2015  Anesthesia complication 10/10/2015   GERD (gastroesophageal reflux disease) 10/10/2015   Hypothyroidism 10/10/2015   Coronary artery disease involving native coronary artery of native heart with angina pectoris (HCC) 10/10/2015   OSA on CPAP 01/10/2007   Diagnosis unknown 01/12/2003    ONSET DATE: Referral Date: 08/13/2022 (Acute on Chronic Dizziness)  REFERRING DIAG: R42 (ICD-10-CM) - Dizziness on standing  THERAPY DIAG:  Dizziness and giddiness  Repeated falls  Unsteadiness on feet  Other abnormalities of gait and mobility  Rationale for Evaluation  and Treatment: Rehabilitation  PERTINENT HISTORY: Anxiety, Arthritis, CAD, CHF, CKD, COPD, Depression, Hx of CVA, Hypothyroidism, OSA, Paroxysmal A-Fib   PRECAUTIONS: None  SUBJECTIVE: No new changes. Has had medication changes and feels like dizziness is better. Unsure of what medication. No falls, but reports a couple stumbles.   PAIN:  Are you having pain? No   OBJECTIVE:   TUG: 11.92 seconds without use of AD  10 Meter Walk Test: 10.82 seconds = 0.92 m/s   VESTIBULAR TREATMENT: Habituation: Bending from Seated Position completed x 5 reps bilaterally with no symptoms. Progressed to standing position x 5 reps bilaterally, x 2 trials. Intermittent rest break between sets due to lightheadedness on initial trial but none on second trial.   Standing Balance:   Standing with Narrow BOS on firm surface, completed horizontal/vertical head turns x 10 reps each direction. More challenge with vertical > horizontal.    Standing with Narrow BOS with eyes closed, 3 x 30 seconds. CGA to mild postural sway.     Partial Tandem Stance: Completed partial tandem stance with initial UE support progressing to no UE support, 2 x 30 seconds each direction.    Dynamic Marching: Standing with single UE support, completed alternating marching to promote SLS, mild unsteadiness.  Sit to Stands with cues for foot placement (due to narrow BOS initially) completed x 10 reps with UE support. Cues for eccentric control required. Mild unsteadiness but improved with wider BOS.     Established Initial HEP. Reviewed with patient and addressed questions/concerns.  Access Code: [email or web address] URL: [email or web address] Date: 09/30/2022 Prepared by: Nehemiah Settle Fairly  Exercises - Romberg Stance with Head Nods  - 2 x daily - 5 x weekly - 1 sets - 10 reps - Romberg Stance with Head Rotation  - 2 x daily - 5 x weekly - 1 sets - 10 reps - Romberg Stance with Eyes Closed  - 2 x daily - 5 x weekly - 1 sets - 30  seconds hold - Sit to Stand  - 2 x daily - 5 x weekly - 1 sets - 10 reps  PATIENT EDUCATION: Education details: Initial HEP Person educated: Patient and Spouse Education method: Explanation Education comprehension: verbalized understanding   HOME EXERCISE PROGRAM: Access Code: [email or web address]   GOALS: Goals reviewed with patient? Yes   SHORT TERM GOALS: Target date: 10/08/2022   Pt will be independent with initial HEP for improved balance and vestibular input  Baseline: no HEP etablished Goal status: INITIAL   2.  Pt will be able to hold situation 2 of M-CTSIB for >/= 25 seconds Baseline: 15 seconds Goal status: INITIAL   3.  Pt will be able to bend over to pick up object independent with </= 5/10 dizziness Baseline: currently unable to do Goal status: INITIAL     LONG TERM GOALS: Target date: 11/05/2022   FOTO LTG to be set as applicable Baseline: TBA Goal status: INITIAL   2.  Pt will report </= 1/5  for all movements on MSQ to indicate improvement in motion sensitivity and improved activity tolerance.  Baseline: 2/5 Goal status: INITIAL   3.  Pt will be abe to ambulate >/= 100 ft with head turns and 90 deg body turns with </= 5/10 dizziness Baseline: unable without assist and significant dizziness Goal status: INITIAL   4.  Pt will be independent with final HEP for improved balance and vestibular input  Baseline: no HEP established  Goal status: INITIAL   ASSESSMENT:   CLINICAL IMPRESSION: Today's skilled PT session focused on continued gait assessment with 10 meter walk test and TUG, patient scores indicate increased rsk of falls at this time. Rest of session spent establishing initial HEP focused on standing balance and safety/balance with transfers. Patient tolerating well with intermittent rest breaks, cues for safety. Therefore PT educated spouse and granddaughter on safety with completion. Pt has Vestibular Assessment at [facility] on 9/30. Pt will continue to benefit from  skilled PT services to address impairments and further reduce fall risk.      OBJECTIVE IMPAIRMENTS: Abnormal gait, decreased activity tolerance, decreased balance, decreased mobility, difficulty walking, decreased strength, decreased safety awareness, and dizziness.    ACTIVITY LIMITATIONS: bending, sitting, standing, transfers, bed mobility, and reach over head   PARTICIPATION LIMITATIONS: driving, community activity, and yard work   PERSONAL FACTORS: Age, Time since onset of injury/illness/exacerbation, and 3+ comorbidities: Anxiety, Arthritis, CAD, CHF, CKD, COPD, Depression, Hx of CVA, Hypothyroidism, OSA, Paroxysmal A-Fib   are also affecting patient's functional outcome.    REHAB POTENTIAL: Good   CLINICAL DECISION MAKING: Evolving/moderate complexity   EVALUATION COMPLEXITY: Moderate     PLAN:   PT FREQUENCY: 1x/week   PT DURATION: 8 weeks   PLANNED INTERVENTIONS: Therapeutic exercises, Therapeutic activity, Neuromuscular re-education, Balance training, Gait training, Patient/Family education, Self Care, Joint mobilization, Stair training, Vestibular training, Canalith repositioning, DME instructions, Cryotherapy, Moist heat, Manual therapy, and Re-evaluation   PLAN FOR NEXT SESSION: results from vestibular assessment from ENT? Continue balance training, functional strengthening, habituation to bending   Howie Ill, PT, DPT 09/30/22 11:50 AM

## 2022-10-11 DIAGNOSIS — G4733 Obstructive sleep apnea (adult) (pediatric): Secondary | ICD-10-CM | POA: Diagnosis not present

## 2022-10-13 ENCOUNTER — Telehealth: Payer: Self-pay | Admitting: Family Medicine

## 2022-10-13 NOTE — Telephone Encounter (Signed)
LVM 10/13/22 to r/s AWV due to overbooked. New appt date 10/22/2022 at 3pm, please confirm date change [facility]   Verlee Rossetti; Care Guide Ambulatory Clinical Support Cool l [facility] Direct Dial: [phone]

## 2022-10-14 ENCOUNTER — Ambulatory Visit: Payer: Medicare PPO | Attending: Physician Assistant

## 2022-10-14 DIAGNOSIS — R2689 Other abnormalities of gait and mobility: Secondary | ICD-10-CM | POA: Insufficient documentation

## 2022-10-14 DIAGNOSIS — R296 Repeated falls: Secondary | ICD-10-CM | POA: Insufficient documentation

## 2022-10-14 DIAGNOSIS — R2681 Unsteadiness on feet: Secondary | ICD-10-CM | POA: Diagnosis not present

## 2022-10-14 DIAGNOSIS — R42 Dizziness and giddiness: Secondary | ICD-10-CM | POA: Diagnosis not present

## 2022-10-14 NOTE — Therapy (Signed)
OUTPATIENT PHYSICAL THERAPY VESTIBULAR TREATMENT NOTE   Patient Name: Luke Mckee MRN: [id number] DOB:February 05, 1940, 82 y.o., male Today's Date: 10/14/2022  PCP: [name], DO  REFERRING PROVIDER: Providence Crosby, PA-C   END OF SESSION:  PT End of Session - 10/14/22 1106     Visit Number 3    Number of Visits 9    Date for PT Re-Evaluation 11/05/22    Authorization Type [employer]    Progress Note Due on Visit 10    PT Start Time 1105   Pt arrived late   PT Stop Time 1148    PT Time Calculation (min) 43 min    Equipment Utilized During Treatment Gait belt    Activity Tolerance Patient tolerated treatment well    Behavior During Therapy WFL for tasks assessed/performed             Past Medical History:  Diagnosis Date   Anxiety    Arthritis    CAD (coronary artery disease) 2005   s/p CABG, DES   CHF (congestive heart failure) (HCC)    in EPIC care everywhere   Chronic kidney disease    COPD (chronic obstructive pulmonary disease) (HCC)    in EPIC care everywhere   Depression    Heart murmur    History of stroke    Hypothyroidism    OSA (obstructive sleep apnea)    Paroxysmal A-fib (HCC)    in EPIC careeverywhere   Past Surgical History:  Procedure Laterality Date   APPENDECTOMY     BUNIONECTOMY     CATARACT EXTRACTION     Right eye   CATARACT EXTRACTION W/PHACO Left 01/19/2021   Procedure: CATARACT EXTRACTION PHACO AND INTRAOCULAR LENS PLACEMENT (IOC) LEFT 3.09 00:28.3;  Surgeon: Nevada Crane, MD;  Location: [facility];  Service: Ophthalmology;  Laterality: Left;   COLONOSCOPY  07/06/2004   CORONARY ARTERY BYPASS GRAFT  2005   LOOP RECORDER INSERTION N/A 07/25/2018   Procedure: LOOP RECORDER INSERTION;  Surgeon: Regan Lemming, MD;  Location: [facility];  Service: Cardiovascular;  Laterality: N/A;   TOTAL HIP ARTHROPLASTY Right 11/23/2011   TOTAL HIP ARTHROPLASTY Left 09/07/2011   TOTAL KNEE ARTHROPLASTY Right  02/15/2018   VASECTOMY  1978   Patient Active Problem List   Diagnosis Date Noted   Dizziness on standing 08/16/2022   Impaired fasting glucose 09/24/2021   Depression 09/24/2021   Sensorineural hearing loss, bilateral 09/24/2021   Hypothyroid 09/24/2021   Hyperlipidemia 09/24/2021   Sleep apnea 09/24/2021   Hallux limitus, acquired, right 11/12/2019   Pre-diabetes 10/17/2019   Recurrent major depressive disorder, in partial remission (HCC) 04/16/2019   Obesity (BMI 30.0-34.9) 04/16/2019   Vasomotor rhinitis 10/25/2018   Hearing loss 08/28/2018   Osteoarthritis 08/28/2018   Tinnitus 08/28/2018   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 (HCC) 07/31/2018   Slurred speech 07/23/2018   Right arm weakness 07/23/2018   Hypokalemia 07/23/2018   Anxiety 07/20/2018   Atherosclerosis of native coronary artery of native heart with stable angina pectoris (HCC) 06/16/2018   Hx of CABG 06/16/2018   Mixed hyperlipidemia 06/16/2018   Benign essential HTN 06/16/2018   History of cerebrovascular accident (CVA) with residual deficit 06/07/2018   Systolic CHF (HCC) 02/15/2018   Hypertension 02/15/2018   S/P total knee arthroplasty, right 02/15/2018   Primary osteoarthritis of both knees 11/25/2017   CAD (coronary artery disease) 11/12/2016   Abnormal nuclear stress test 06/30/2016   Hallux limitus  of right foot 10/22/2015   Anesthesia complication 10/10/2015   GERD (gastroesophageal reflux disease) 10/10/2015   Hypothyroidism 10/10/2015   Coronary artery disease involving native coronary artery of native heart with angina pectoris (HCC) 10/10/2015   OSA on CPAP 01/10/2007   Diagnosis unknown 01/12/2003    ONSET DATE: Referral Date: 08/13/2022 (Acute on Chronic Dizziness)  REFERRING DIAG: R42 (ICD-10-CM) - Dizziness on standing  THERAPY DIAG:  Dizziness and giddiness  Repeated falls  Unsteadiness on feet  Other abnormalities of gait and mobility  Rationale  for Evaluation and Treatment: Rehabilitation  PERTINENT HISTORY: Anxiety, Arthritis, CAD, CHF, CKD, COPD, Depression, Hx of CVA, Hypothyroidism, OSA, Paroxysmal A-Fib   PRECAUTIONS: None  SUBJECTIVE: Patient reports that the testing at ENT, was super exhausted after completion and did have some dizziness. No dizziness currently, and denies dizziness since this evaluation.   PAIN:  Are you having pain? No   OBJECTIVE:  GAIT: Gait pattern:  mild unsteadiness intermittent and step through pattern Distance walked: 150 Assistive device utilized: None Level of assistance: SBA Comments: mild unsteadiness with turns, but improved stability with ambulation in straightaways. Education on importance of daily ambulation.     VESTIBULAR TREATMENT: Gaze Stabilization:  VOR x 1 Horizontal: seated, completed x 30 seconds, x 45 seconds, cues for technique, range and speed of head movement. Completed x 2 trials. No dizziness reported, but mild blurred vision.  VOR x 1 Vertical: seated, completed x 30 seconds, x 45 seconds, cues for technique, range and speed of head movement. Completed x 2 trials. No dizziness reported, but mild blurred vision.  Added to HEP:  Gaze Stabilization: Sitting    Keeping eyes on target on wall 2-3 feet away, tilt head down 15-30 and move head side to side for 45 seconds. Repeat while moving head up and down for 45 seconds. Do 2 sessions per day.   Standing Balance:   Standing with Feet Hip Width of Foam Surface, completed eyes closed 3 x 30 seconds, then added in horizontal/vertical head turns x 10 reps each direction with single UE support. Cues to isolate head movement vs. Trunk movement.     Anterior/Posterior Stepping Strategy: Completed alternating anterior/posterior stepping strategy with additon of horizontal head turns with stepping, progressing from single UE support to no UE support 2 sets x 10 reps.      Dynamic Toe Tap to Target: Standing with single UE  support, completed alternating toe tap forward to target to promote SLS x10 reps. Then progressed to crossover toe taps x 10 reps. Increased challenge requiring CGA cues for pacing.    Verbal reviewed HEP with patient, no questions/concerns at this time.  Access Code: [email or web address] URL: [email or web address] Date: 09/30/2022 Prepared by: Nehemiah Settle Fairly  Exercises - Romberg Stance with Head Nods  - 2 x daily - 5 x weekly - 1 sets - 10 reps - Romberg Stance with Head Rotation  - 2 x daily - 5 x weekly - 1 sets - 10 reps - Romberg Stance with Eyes Closed  - 2 x daily - 5 x weekly - 1 sets - 30 seconds hold - Sit to Stand  - 2 x daily - 5 x weekly - 1 sets - 10 reps  PATIENT EDUCATION: Education details: Museum/gallery conservator; Continue HEP Person educated: Patient and Spouse Education method: Explanation Education comprehension: verbalized understanding   HOME EXERCISE PROGRAM: Access Code: [email or web address]   GOALS: Goals reviewed with patient? Yes   SHORT TERM GOALS: Target date: 10/08/2022  Pt will be independent with initial HEP for improved balance and vestibular input  Baseline: no HEP etablished Goal status: INITIAL   2.  Pt will be able to hold situation 2 of M-CTSIB for >/= 25 seconds Baseline: 15 seconds Goal status: INITIAL   3.  Pt will be able to bend over to pick up object independent with </= 5/10 dizziness Baseline: currently unable to do Goal status: INITIAL     LONG TERM GOALS: Target date: 11/05/2022   FOTO LTG to be set as applicable Baseline: TBA Goal status: INITIAL   2.  Pt will report </= 1/5 for all movements on MSQ to indicate improvement in motion sensitivity and improved activity tolerance.  Baseline: 2/5 Goal status: INITIAL   3.  Pt will be abe to ambulate >/= 100 ft with head turns and 90 deg body turns with </= 5/10 dizziness Baseline: unable without assist and significant dizziness Goal status: INITIAL   4.  Pt will be independent with  final HEP for improved balance and vestibular input  Baseline: no HEP established  Goal status: INITIAL   ASSESSMENT:   CLINICAL IMPRESSION: Continued session focused on activities for improved balance, isolating vestibular input with vision removed and horizontal/vertical head movements. Also initiated VOR x 1 with plain background, patient tolerating well overall. Added to HEP, and reviewed prior exercises. Patient is making steady progress with PT services and will continue to benefit from PT services to improve balance and reduce fall risk.       OBJECTIVE IMPAIRMENTS: Abnormal gait, decreased activity tolerance, decreased balance, decreased mobility, difficulty walking, decreased strength, decreased safety awareness, and dizziness.    ACTIVITY LIMITATIONS: bending, sitting, standing, transfers, bed mobility, and reach over head   PARTICIPATION LIMITATIONS: driving, community activity, and yard work   PERSONAL FACTORS: Age, Time since onset of injury/illness/exacerbation, and 3+ comorbidities: Anxiety, Arthritis, CAD, CHF, CKD, COPD, Depression, Hx of CVA, Hypothyroidism, OSA, Paroxysmal A-Fib   are also affecting patient's functional outcome.    REHAB POTENTIAL: Good   CLINICAL DECISION MAKING: Evolving/moderate complexity   EVALUATION COMPLEXITY: Moderate     PLAN:   PT FREQUENCY: 1x/week   PT DURATION: 8 weeks   PLANNED INTERVENTIONS: Therapeutic exercises, Therapeutic activity, Neuromuscular re-education, Balance training, Gait training, Patient/Family education, Self Care, Joint mobilization, Stair training, Vestibular training, Canalith repositioning, DME instructions, Cryotherapy, Moist heat, Manual therapy, and Re-evaluation   PLAN FOR NEXT SESSION: Check STGS. Continue VOR, Habituation to Bending, Standing Balance, Dynamic Gait Activities    Howie Ill, PT, DPT 10/14/22 11:55 AM

## 2022-10-19 ENCOUNTER — Other Ambulatory Visit: Payer: Medicare PPO

## 2022-10-19 ENCOUNTER — Other Ambulatory Visit: Payer: Self-pay

## 2022-10-19 DIAGNOSIS — G4733 Obstructive sleep apnea (adult) (pediatric): Secondary | ICD-10-CM | POA: Diagnosis not present

## 2022-10-19 NOTE — Patient Instructions (Signed)
Visit Information  Thank you for taking time to visit with me today. Please don't hesitate to contact me if I can be of assistance to you before our next scheduled telephone appointment.  Following are the goals we discussed today:   Goals Addressed             This Visit's Progress    RNCM Care Management  Expected Outcome:  Monitor, Self-Manage, and Reduce Symptoms of Hypertension       Current Barriers:  Knowledge Deficits related to effective management of HTN Chronic Disease Management support and education needs related to HTN and heart health BP Readings from Last 3 Encounters:  08/13/22 110/62  08/05/22 97/62  07/29/22 125/78    Planned Interventions: Evaluation of current treatment plan related to hypertension self management and patient's adherence to plan as established by provider. The patient is doing good. He is taking Vestibular Treatments and he can tell a positive difference in the way he feels. The patient is more active and has not had any dizziness in several weeks. He is happy about that. He will continue to do the vestibular treatment  through November 14th. ;   Provided education to patient re: stroke prevention, s/s of heart attack and stroke; Reviewed prescribed diet Heart Healthy. The patient is compliant with heart healthy diet. The patient is losing weight. He and his wife are taking a supplement called Lean Belly. Feels great and is also going to the [facility] and doing the silver sneakers program. His weight is down to 209 from 247.  Praised the patient for positive changes.  Reviewed medications with patient and discussed importance of compliance. The patient is compliant with medications. The patient and his wife work with the pharm D on a regular basis. Denies any acute findings today. Review of the changes from the sertraline to the Lexapro and the patients wife knows how to taper down the dose and the recommendations. Has a good understanding and will call for  new questions or concerns  Discussed plans with patient for ongoing care management follow up and provided patient with direct contact information for care management team; Advised patient, providing education and rationale, to monitor blood pressure daily and record, calling PCP for findings outside established parameters. Blood pressures are stable. Denies any acute findings related to blood pressures or heart health;  09-12-2022 167/100, p-88 standing  09-12-2022 116/71, p-85 sitting   09-12-2022 107/53, p77 laying   Reviewed scheduled/upcoming provider appointments including: has an appointment for AWV on 10-22-2022 and sees the pcp again on 12-21-2022, reminders provided today. Has vestibular treatments every Thursday at 11 am. Advised patient to discuss changes in HTN or heart health with provider; Provided education on prescribed diet heart healthy. Review and education provided;  Discussed complications of poorly controlled blood pressure such as heart disease, stroke, circulatory complications, vision complications, kidney impairment, sexual dysfunction;  Screening for signs and symptoms of depression related to chronic disease state;  Assessed social determinant of health barriers;  The patients family is helping more with the patient needs. Their granddaughter will be going with them to all of their upcoming appointments.  Extensive review of orthostatic hypotension since the patient is having some dizziness. The patient states that his dizziness is better. He has started taking Meclizine BID. He is changing his position slowly and denies any acute findings today. His wife states that he has not had dizziness in several weeks now. He is practicing safety and is happy with the  vestibular treatment results.     Symptom Management: Take medications as prescribed   Attend all scheduled provider appointments Call pharmacy for medication refills 3-7 days in advance of running out of  medications Call provider office for new concerns or questions  call the Suicide and Crisis Lifeline: 988 call the [place] National Suicide Prevention Lifeline: 303-510-6046 or TTY: (574)080-4418 TTY 531-577-2038) to talk to a trained counselor call 1-800-273-TALK (toll free, 24 hour hotline) if experiencing a Mental Health or Behavioral Health Crisis  check blood pressure weekly learn about high blood pressure call doctor for signs and symptoms of high blood pressure develop an action plan for high blood pressure keep all doctor appointments take medications for blood pressure exactly as prescribed report new symptoms to your doctor  Follow Up Plan: Telephone follow up appointment with care management team member scheduled for: 12-28-2022 at 145 pm       RNCM Care Management:  Expected Outcome:  Monitor, Self-Manage and Reduce Symptoms of Heart Failure       Current Barriers:  Knowledge Deficits related to effective management of HF Chronic Disease Management support and education needs related to effective management of HF Wt Readings from Last 3 Encounters:  08/13/22 215 lb (97.5 kg)  08/05/22 215 lb (97.5 kg)  07/29/22 216 lb 14.9 oz (98.4 kg)  Stated weight today is 209  Planned Interventions: Basic overview and discussion of pathophysiology of Heart Failure reviewed. The patient has been steadily losing weight by the use of a product called "Lean Belly". The patient denies any issues with swelling or edema. The patient states he is doing well and denies any new concerns with heart failure or heart health. Review and patient continues to do well with management of his heart failure.  Provided education on low sodium diet. The patient is compliant with the heart healthy diet.  Reviewed Heart Failure Action Plan in depth. Knows how to effectively manage his heart failure. Assessed need for readable accurate scales in home. Has a scale. Provided education about placing scale on hard,  flat surface Advised patient to weigh each morning after emptying bladder Discussed importance of daily weight and advised patient to weigh and record daily. Weight today was 209, down from 247 Reviewed role of diuretics in prevention of fluid overload and management of heart failure. Is compliant with medications. Ask the patients wife to discuss maybe lowering his does of Lasix since the patient has lost weight and is not having the edema as he was. He is on 160 mg daily. He is also using compression hose and this is helping Discussed the importance of keeping all appointments with provider. Saw neurologist recently and has several upcoming appointments with specialist. Knows to call the office for changes or new needs.  Provided patient with education about the role of exercise in the management of heart failure Advised patient to discuss changes in fluid balance or new concerns or questions related to heart failure with provider. The patient states he is stable.  The patient is interested in a life alert system for him and his wife. Information provided and gave additional resources for life alert system in the event that his insurance does not offer options.  The patient will get new hearing aides when he goes to the [place] the end of September. They will make sure they fit well and adjust accordingly. He has his new hearing aides and can already tell a positive difference in his hearing.   Symptom Management: Take  medications as prescribed   Attend all scheduled provider appointments Call pharmacy for medication refills 3-7 days in advance of running out of medications Perform all self care activities independently  Perform IADL's (shopping, preparing meals, housekeeping, managing finances) independently Call provider office for new concerns or questions  call the Suicide and Crisis Lifeline: 988 call the [place] National Suicide Prevention Lifeline: 907-832-7965 or TTY: 334-072-1540 TTY  318-344-3432) to talk to a trained counselor call 1-800-273-TALK (toll free, 24 hour hotline) if experiencing a Mental Health or Behavioral Health Crisis   Follow Up Plan: Telephone follow up appointment with care management team member scheduled for: 12-28-2022 at 145 pm           Our next appointment is by telephone on 12-28-2022 at 145 pm  Please call the care guide team at [phone] if you need to cancel or reschedule your appointment.   If you are experiencing a Mental Health or Behavioral Health Crisis or need someone to talk to, please call the Suicide and Crisis Lifeline: 988 call the [place] National Suicide Prevention Lifeline: 931-361-3835 or TTY: 769-027-8974 TTY 208-122-3394) to talk to a trained counselor call 1-800-273-TALK (toll free, 24 hour hotline)   Patient verbalizes understanding of instructions and care plan provided today and agrees to view in MyChart. Active MyChart status and patient understanding of how to access instructions and care plan via MyChart confirmed with patient.     Telephone follow up appointment with care management team member scheduled for: 12-28-2022 at 145 pm  Alto Denver RN, MSN, CCM RN Care Manager  [facility]  Ambulatory Care Management  Direct Number: [phone]

## 2022-10-19 NOTE — Patient Outreach (Signed)
Care Management   Visit Note  10/19/2022 Name: Luke Mckee MRN: [id number] DOB: October 23, 1940  Subjective: Luke Mckee is a 82 y.o. year old male who is a primary care patient of [name], DO. The Care Management team was consulted for assistance.      Engaged with patient spoke with patient by telephone.    Goals Addressed             This Visit's Progress    RNCM Care Management  Expected Outcome:  Monitor, Self-Manage, and Reduce Symptoms of Hypertension       Current Barriers:  Knowledge Deficits related to effective management of HTN Chronic Disease Management support and education needs related to HTN and heart health BP Readings from Last 3 Encounters:  08/13/22 110/62  08/05/22 97/62  07/29/22 125/78    Planned Interventions: Evaluation of current treatment plan related to hypertension self management and patient's adherence to plan as established by provider. The patient is doing good. He is taking Vestibular Treatments and he can tell a positive difference in the way he feels. The patient is more active and has not had any dizziness in several weeks. He is happy about that. He will continue to do the vestibular treatment  through November 14th. ;   Provided education to patient re: stroke prevention, s/s of heart attack and stroke; Reviewed prescribed diet Heart Healthy. The patient is compliant with heart healthy diet. The patient is losing weight. He and his wife are taking a supplement called Lean Belly. Feels great and is also going to the [facility] and doing the silver sneakers program. His weight is down to 209 from 247.  Praised the patient for positive changes.  Reviewed medications with patient and discussed importance of compliance. The patient is compliant with medications. The patient and his wife work with the pharm D on a regular basis. Denies any acute findings today. Review of the changes from the sertraline to the Lexapro and the patients wife knows  how to taper down the dose and the recommendations. Has a good understanding and will call for new questions or concerns  Discussed plans with patient for ongoing care management follow up and provided patient with direct contact information for care management team; Advised patient, providing education and rationale, to monitor blood pressure daily and record, calling PCP for findings outside established parameters. Blood pressures are stable. Denies any acute findings related to blood pressures or heart health;  09-12-2022 167/100, p-88 standing  09-12-2022 116/71, p-85 sitting   09-12-2022 107/53, p77 laying   Reviewed scheduled/upcoming provider appointments including: has an appointment for AWV on 10-22-2022 and sees the pcp again on 12-21-2022, reminders provided today. Has vestibular treatments every Thursday at 11 am. Advised patient to discuss changes in HTN or heart health with provider; Provided education on prescribed diet heart healthy. Review and education provided;  Discussed complications of poorly controlled blood pressure such as heart disease, stroke, circulatory complications, vision complications, kidney impairment, sexual dysfunction;  Screening for signs and symptoms of depression related to chronic disease state;  Assessed social determinant of health barriers;  The patients family is helping more with the patient needs. Their granddaughter will be going with them to all of their upcoming appointments.  Extensive review of orthostatic hypotension since the patient is having some dizziness. The patient states that his dizziness is better. He has started taking Meclizine BID. He is changing his position slowly and denies any acute findings today. His wife states that  he has not had dizziness in several weeks now. He is practicing safety and is happy with the vestibular treatment results.     Symptom Management: Take medications as prescribed   Attend all scheduled provider  appointments Call pharmacy for medication refills 3-7 days in advance of running out of medications Call provider office for new concerns or questions  call the Suicide and Crisis Lifeline: 988 call the [place] National Suicide Prevention Lifeline: 7823002101 or TTY: 346-372-9894 TTY (515)800-5390) to talk to a trained counselor call 1-800-273-TALK (toll free, 24 hour hotline) if experiencing a Mental Health or Behavioral Health Crisis  check blood pressure weekly learn about high blood pressure call doctor for signs and symptoms of high blood pressure develop an action plan for high blood pressure keep all doctor appointments take medications for blood pressure exactly as prescribed report new symptoms to your doctor  Follow Up Plan: Telephone follow up appointment with care management team member scheduled for: 12-28-2022 at 145 pm       RNCM Care Management:  Expected Outcome:  Monitor, Self-Manage and Reduce Symptoms of Heart Failure       Current Barriers:  Knowledge Deficits related to effective management of HF Chronic Disease Management support and education needs related to effective management of HF Wt Readings from Last 3 Encounters:  08/13/22 215 lb (97.5 kg)  08/05/22 215 lb (97.5 kg)  07/29/22 216 lb 14.9 oz (98.4 kg)  Stated weight today is 209  Planned Interventions: Basic overview and discussion of pathophysiology of Heart Failure reviewed. The patient has been steadily losing weight by the use of a product called "Lean Belly". The patient denies any issues with swelling or edema. The patient states he is doing well and denies any new concerns with heart failure or heart health. Review and patient continues to do well with management of his heart failure.  Provided education on low sodium diet. The patient is compliant with the heart healthy diet.  Reviewed Heart Failure Action Plan in depth. Knows how to effectively manage his heart failure. Assessed need for  readable accurate scales in home. Has a scale. Provided education about placing scale on hard, flat surface Advised patient to weigh each morning after emptying bladder Discussed importance of daily weight and advised patient to weigh and record daily. Weight today was 209, down from 247 Reviewed role of diuretics in prevention of fluid overload and management of heart failure. Is compliant with medications. Ask the patients wife to discuss maybe lowering his does of Lasix since the patient has lost weight and is not having the edema as he was. He is on 160 mg daily. He is also using compression hose and this is helping Discussed the importance of keeping all appointments with provider. Saw neurologist recently and has several upcoming appointments with specialist. Knows to call the office for changes or new needs.  Provided patient with education about the role of exercise in the management of heart failure Advised patient to discuss changes in fluid balance or new concerns or questions related to heart failure with provider. The patient states he is stable.  The patient is interested in a life alert system for him and his wife. Information provided and gave additional resources for life alert system in the event that his insurance does not offer options.  The patient will get new hearing aides when he goes to the [place] the end of September. They will make sure they fit well and adjust accordingly. He has his  new hearing aides and can already tell a positive difference in his hearing.   Symptom Management: Take medications as prescribed   Attend all scheduled provider appointments Call pharmacy for medication refills 3-7 days in advance of running out of medications Perform all self care activities independently  Perform IADL's (shopping, preparing meals, housekeeping, managing finances) independently Call provider office for new concerns or questions  call the Suicide and Crisis Lifeline: 988 call  the [place] National Suicide Prevention Lifeline: 337-252-8757 or TTY: 228-184-0230 TTY (251)763-2354) to talk to a trained counselor call 1-800-273-TALK (toll free, 24 hour hotline) if experiencing a Mental Health or Behavioral Health Crisis   Follow Up Plan: Telephone follow up appointment with care management team member scheduled for: 12-28-2022 at 145 pm           Consent to Services:  Patient was given information about care management services, agreed to services, and gave verbal consent to participate.   Plan: Telephone follow up appointment with care management team member scheduled for: 12-28-2022 at 145 pm  Alto Denver RN, MSN, CCM RN Care Manager  [facility]  Ambulatory Care Management  Direct Number: [phone]

## 2022-10-21 ENCOUNTER — Ambulatory Visit: Payer: Medicare PPO

## 2022-10-21 DIAGNOSIS — R42 Dizziness and giddiness: Secondary | ICD-10-CM | POA: Diagnosis not present

## 2022-10-21 DIAGNOSIS — R2689 Other abnormalities of gait and mobility: Secondary | ICD-10-CM

## 2022-10-21 DIAGNOSIS — R296 Repeated falls: Secondary | ICD-10-CM | POA: Diagnosis not present

## 2022-10-21 DIAGNOSIS — R2681 Unsteadiness on feet: Secondary | ICD-10-CM

## 2022-10-21 NOTE — Therapy (Signed)
OUTPATIENT PHYSICAL THERAPY VESTIBULAR TREATMENT NOTE   Patient Name: Luke Mckee MRN: [id number] DOB:1940/10/24, 82 y.o., male Today's Date: 10/21/2022  PCP: [name], DO  REFERRING PROVIDER: Providence Crosby, PA-C   END OF SESSION:  PT End of Session - 10/21/22 1107     Visit Number 4    Number of Visits 9    Date for PT Re-Evaluation 11/05/22    Authorization Type [employer]    Progress Note Due on Visit 10    PT Start Time 1107   Pt arrived late   PT Stop Time 1145    PT Time Calculation (min) 38 min    Equipment Utilized During Treatment Gait belt    Activity Tolerance Patient tolerated treatment well    Behavior During Therapy WFL for tasks assessed/performed             Past Medical History:  Diagnosis Date   Anxiety    Arthritis    CAD (coronary artery disease) 2005   s/p CABG, DES   CHF (congestive heart failure) (HCC)    in EPIC care everywhere   Chronic kidney disease    COPD (chronic obstructive pulmonary disease) (HCC)    in EPIC care everywhere   Depression    Heart murmur    History of stroke    Hypothyroidism    OSA (obstructive sleep apnea)    Paroxysmal A-fib (HCC)    in EPIC careeverywhere   Past Surgical History:  Procedure Laterality Date   APPENDECTOMY     BUNIONECTOMY     CATARACT EXTRACTION     Right eye   CATARACT EXTRACTION W/PHACO Left 01/19/2021   Procedure: CATARACT EXTRACTION PHACO AND INTRAOCULAR LENS PLACEMENT (IOC) LEFT 3.09 00:28.3;  Surgeon: Nevada Crane, MD;  Location: [facility];  Service: Ophthalmology;  Laterality: Left;   COLONOSCOPY  07/06/2004   CORONARY ARTERY BYPASS GRAFT  2005   LOOP RECORDER INSERTION N/A 07/25/2018   Procedure: LOOP RECORDER INSERTION;  Surgeon: Regan Lemming, MD;  Location: [facility];  Service: Cardiovascular;  Laterality: N/A;   TOTAL HIP ARTHROPLASTY Right 11/23/2011   TOTAL HIP ARTHROPLASTY Left 09/07/2011   TOTAL KNEE ARTHROPLASTY Right  02/15/2018   VASECTOMY  1978   Patient Active Problem List   Diagnosis Date Noted   Dizziness on standing 08/16/2022   Impaired fasting glucose 09/24/2021   Depression 09/24/2021   Sensorineural hearing loss, bilateral 09/24/2021   Hypothyroid 09/24/2021   Hyperlipidemia 09/24/2021   Sleep apnea 09/24/2021   Hallux limitus, acquired, right 11/12/2019   Pre-diabetes 10/17/2019   Recurrent major depressive disorder, in partial remission (HCC) 04/16/2019   Obesity (BMI 30.0-34.9) 04/16/2019   Vasomotor rhinitis 10/25/2018   Hearing loss 08/28/2018   Osteoarthritis 08/28/2018   Tinnitus 08/28/2018   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 (HCC) 07/31/2018   Slurred speech 07/23/2018   Right arm weakness 07/23/2018   Hypokalemia 07/23/2018   Anxiety 07/20/2018   Atherosclerosis of native coronary artery of native heart with stable angina pectoris (HCC) 06/16/2018   Hx of CABG 06/16/2018   Mixed hyperlipidemia 06/16/2018   Benign essential HTN 06/16/2018   History of cerebrovascular accident (CVA) with residual deficit 06/07/2018   Systolic CHF (HCC) 02/15/2018   Hypertension 02/15/2018   S/P total knee arthroplasty, right 02/15/2018   Primary osteoarthritis of both knees 11/25/2017   CAD (coronary artery disease) 11/12/2016   Abnormal nuclear stress test 06/30/2016   Hallux limitus  of right foot 10/22/2015   Anesthesia complication 10/10/2015   GERD (gastroesophageal reflux disease) 10/10/2015   Hypothyroidism 10/10/2015   Coronary artery disease involving native coronary artery of native heart with angina pectoris (HCC) 10/10/2015   OSA on CPAP 01/10/2007   Diagnosis unknown 01/12/2003    ONSET DATE: Referral Date: 08/13/2022 (Acute on Chronic Dizziness)  REFERRING DIAG: R42 (ICD-10-CM) - Dizziness on standing  THERAPY DIAG:  Dizziness and giddiness  Repeated falls  Unsteadiness on feet  Other abnormalities of gait and mobility  Rationale  for Evaluation and Treatment: Rehabilitation  PERTINENT HISTORY: Anxiety, Arthritis, CAD, CHF, CKD, COPD, Depression, Hx of CVA, Hypothyroidism, OSA, Paroxysmal A-Fib   PRECAUTIONS: None  SUBJECTIVE: Patient reports has been very busy over the last few days. Reports no dizziness since last visit. Patient reports still have some unsteadiness.  PAIN:  Are you having pain? No   OBJECTIVE: M-CTSIB:   Situation 1: 30 seconds  Situation 2: 30 seconds (increased postural sway)  Situation 3: 30 seconds (increased postural sway)   Situation 4: 12 seconds (increased postural sway)   GAIT: Gait pattern:  mild unsteadiness intermittent and step through pattern Distance walked: 150'  Assistive device utilized: None Level of assistance: SBA Comments: Completed ambulation around therapy gym.   VESTIBULAR TREATMENT: Gaze Stabilization:  VOR x 1 Horizontal: standing  completed x 60 seconds,  cues for technique, range and speed of head movement. Completed x 2 trials. No dizziness reported, but mild blurred vision.  VOR x 1 Vertical: standing, completed x 60 seconds, cues for technique, range and speed of head movement. Completed x 2 trials. No dizziness reported, but mild blurred vision.   Habituation:    Completed bending x 15 reps to target for habituation (use of random feature with blaze pods), with supervision. No dizziness reported. Mild intermittent unsteadiness.   Standing Balance:     Dynamic Toe Tap to Target (Blaze Pods): Standing without UE support, completed alternating toe tap to random target to promote SLS x 15 reps. Increased time to promote prolonged SLS.   Standing Balance: Surface: Airex Position: Narrow Base of Support Completed with: Eyes Open; Head Turns x 10 Reps and Head Nods x 10 Reps. Completed x 2 sets of completion.    Gait with Head Turns: Completed ambulation 80' x 2 with addition of horizontal head turns and vertical head turns. No dizziness reported, but mild  unsteadiness increasing with fatigue.    PATIENT EDUCATION: Education details: Progress toward STGs; Continue HEP Person educated: Patient and Spouse Education method: Explanation Education comprehension: verbalized understanding   HOME EXERCISE PROGRAM: Access Code: [email or web address]   GOALS: Goals reviewed with patient? Yes   SHORT TERM GOALS: Target date: 10/08/2022   Pt will be independent with initial HEP for improved balance and vestibular input  Baseline: no HEP etablished; reports and demonstrates independence with current HEP Goal status: MET   2.  Pt will be able to hold situation 2 of M-CTSIB for >/= 25 seconds Baseline: 15 seconds; 30 seconds Goal status: MET   3.  Pt will be able to bend over to pick up object independent with </= 5/10 dizziness Baseline: currently unable to do; able to bend to pick up objects x 15 times with no dizziness Goal status: MET     LONG TERM GOALS: Target date: 11/05/2022   FOTO LTG to be set as applicable Baseline: TBA Goal status: INITIAL   2.  Pt will report </= 1/5 for all movements on  MSQ to indicate improvement in motion sensitivity and improved activity tolerance.  Baseline: 2/5 Goal status: INITIAL   3.  Pt will be abe to ambulate >/= 100 ft with head turns and 90 deg body turns with </= 5/10 dizziness Baseline: unable without assist and significant dizziness Goal status: INITIAL   4.  Pt will be independent with final HEP for improved balance and vestibular input  Baseline: no HEP established  Goal status: INITIAL   ASSESSMENT:   CLINICAL IMPRESSION: Today's skilled PT session included assessment of patient's progress toward STGs. Patient able to meet all STG. Patient has had significant improvements in dizziness with functional activities including bending and turns. Patient continues to have mild unsteadiness with certain activities. Continued progression of dynamic balance and habituation activities. Patient is making steady  progress with therapy and will continue to benefit from skilled PT services to progress toward LTGs.    OBJECTIVE IMPAIRMENTS: Abnormal gait, decreased activity tolerance, decreased balance, decreased mobility, difficulty walking, decreased strength, decreased safety awareness, and dizziness.    ACTIVITY LIMITATIONS: bending, sitting, standing, transfers, bed mobility, and reach over head   PARTICIPATION LIMITATIONS: driving, community activity, and yard work   PERSONAL FACTORS: Age, Time since onset of injury/illness/exacerbation, and 3+ comorbidities: Anxiety, Arthritis, CAD, CHF, CKD, COPD, Depression, Hx of CVA, Hypothyroidism, OSA, Paroxysmal A-Fib   are also affecting patient's functional outcome.    REHAB POTENTIAL: Good   CLINICAL DECISION MAKING: Evolving/moderate complexity   EVALUATION COMPLEXITY: Moderate     PLAN:   PT FREQUENCY: 1x/week   PT DURATION: 8 weeks   PLANNED INTERVENTIONS: Therapeutic exercises, Therapeutic activity, Neuromuscular re-education, Balance training, Gait training, Patient/Family education, Self Care, Joint mobilization, Stair training, Vestibular training, Canalith repositioning, DME instructions, Cryotherapy, Moist heat, Manual therapy, and Re-evaluation   PLAN FOR NEXT SESSION: Continue VOR, Habituation to Bending, Standing Balance, Dynamic Gait Activities    Howie Ill, PT, DPT 10/21/22 1:02 PM

## 2022-10-22 ENCOUNTER — Ambulatory Visit: Payer: Medicare PPO

## 2022-10-22 VITALS — BP 100/60 | Ht 68.0 in | Wt 216.8 lb

## 2022-10-22 DIAGNOSIS — Z0000 Encounter for general adult medical examination without abnormal findings: Secondary | ICD-10-CM

## 2022-10-22 NOTE — Patient Instructions (Addendum)
Mr. Kluver , Thank you for taking time to come for your Medicare Wellness Visit. I appreciate your ongoing commitment to your health goals. Please review the following plan we discussed and let me know if I can assist you in the future.   Referrals/Orders/Follow-Ups/Clinician Recommendations: none  This is a list of the screening recommended for you and due dates:  Health Maintenance  Topic Date Due   Medicare Annual Wellness Visit  10/22/2023   DTaP/Tdap/Td vaccine (5 - Td or Tdap) 10/09/2030   Pneumonia Vaccine  Completed   Flu Shot  Completed   Zoster (Shingles) Vaccine  Completed   HPV Vaccine  Aged Out   COVID-19 Vaccine  Discontinued    Advanced directives: (ACP Link)Information on Advanced Care Planning can be found at [facility] [place] Advance Health Care Directives Advance Health Care Directives ([email or web address]   Next Medicare Annual Wellness Visit scheduled for next year: Yes   10/27/23 @ 2:20 pm in person

## 2022-10-22 NOTE — Progress Notes (Signed)
Subjective:   Luke Mckee is a 82 y.o. male who presents for Medicare Annual/Subsequent preventive examination.  Visit Complete: In person  Cardiac Risk Factors include: advanced age (>31men, >37 women);dyslipidemia;hypertension;male gender;obesity (BMI >30kg/m2)     Objective:    Today's Vitals   10/22/22 1502  BP: 100/60  Weight: 216 lb 12.8 oz (98.3 kg)  Height: 5\' 8"  (1.727 m)   Body mass index is 32.96 kg/m.     10/22/2022    3:12 PM 07/29/2022    2:08 PM 09/25/2021    2:43 PM 04/14/2021   11:51 AM 01/19/2021   10:17 AM 10/08/2020   12:03 PM 07/17/2019   11:06 AM  Advanced Directives  Does Patient Have a Medical Advance Directive? Yes Yes No No No No No  Type of Advance Directive Healthcare Power of [employer] Power of Attorney       Does patient want to make changes to medical advance directive? Yes (Inpatient - patient defers changing a medical advance directive and declines information at this time)        Copy of Healthcare Power of Attorney in Chart? No - copy requested        Would patient like information on creating a medical advance directive? No - Patient declined No - Guardian declined No - Patient declined No - Patient declined No - Patient declined      Current Medications (verified) Outpatient Encounter Medications as of 10/22/2022  Medication Sig   acetaminophen (TYLENOL) 650 MG CR tablet Take 1,300 mg by mouth at bedtime as needed.   albuterol (VENTOLIN HFA) 108 (90 Base) MCG/ACT inhaler Inhale 1-2 puffs into the lungs every 4 (four) hours as needed for wheezing or shortness of breath (cough).   aspirin EC 81 MG tablet Take 81 mg by mouth daily.   atenolol (TENORMIN) 50 MG tablet Take 1 tablet (50 mg total) by mouth daily. (Patient taking differently: Take 25 mg by mouth daily.)   atorvastatin (LIPITOR) 80 MG tablet Take 1 tablet (80 mg total) by mouth daily.   Calcium Carb-Cholecalciferol (CALCIUM 600 + D PO) Take 1 tablet by mouth daily.    Cholecalciferol (VITAMIN D) 50 MCG (2000 UT) tablet Take 2,000 Units by mouth daily.   clopidogrel (PLAVIX) 75 MG tablet Take 1 tablet (75 mg total) by mouth daily.   clotrimazole-betamethasone (LOTRISONE) cream Apply twice a day for 1-2 weeks, may repeat if need in future   co-enzyme Q-10 30 MG capsule Take 100 mg by mouth daily.   colchicine 0.6 MG tablet For acute gout flare take 2 pills at once, then take a 3rd pill 2 hours later. Then take 1 pill daily for 7-10 days or until resolved.   diclofenac sodium (VOLTAREN) 1 % GEL Apply 2 g topically 4 (four) times daily as needed for pain. Foot pain   escitalopram (LEXAPRO) 10 MG tablet Take 20 mg by mouth daily. Has transitioned off of the Zoloft and is now taking Lexapro 20 mg daily   furosemide (LASIX) 80 MG tablet TAKE 1 TABLET(80 MG) BY MOUTH TWICE DAILY   ipratropium (ATROVENT) 0.06 % nasal spray Place 2 sprays into both nostrils 4 (four) times daily as needed for rhinitis.   levothyroxine (SYNTHROID) 125 MCG tablet TAKE 1 TABLET(125 MCG) BY MOUTH DAILY BEFORE BREAKFAST   loratadine (CLARITIN) 10 MG tablet Take 10 mg by mouth daily.   LORazepam (ATIVAN) 0.5 MG tablet Take 1 tablet (0.5 mg total) by mouth daily as needed for  anxiety or sleep.   meclizine (ANTIVERT) 12.5 MG tablet Take 1 tablet (12.5 mg total) by mouth 3 (three) times daily as needed for dizziness.   montelukast (SINGULAIR) 10 MG tablet Take 1 tablet (10 mg total) by mouth at bedtime.   Multiple Vitamin (MULTIVITAMIN) tablet Take 1 tablet by mouth daily.   nitroGLYCERIN (NITROSTAT) 0.4 MG SL tablet Place 1 tablet (0.4 mg total) under the tongue every 5 (five) minutes as needed for chest pain.   PATADAY 0.1 % ophthalmic solution Place into both eyes as needed.    sennosides-docusate sodium (SENOKOT-S) 8.6-50 MG tablet Take 1-2 tablets by mouth daily as needed for constipation.   traZODone (DESYREL) 50 MG tablet Take 1-2 tablets (50-100 mg total) by mouth at bedtime.   No  facility-administered encounter medications on file as of 10/22/2022.    Allergies (verified) Ace inhibitors, Cephalexin, Crestor [rosuvastatin calcium], and Tape   History: Past Medical History:  Diagnosis Date   Anxiety    Arthritis    CAD (coronary artery disease) 2005   s/p CABG, DES   CHF (congestive heart failure) (HCC)    in EPIC care everywhere   Chronic kidney disease    COPD (chronic obstructive pulmonary disease) (HCC)    in EPIC care everywhere   Depression    Heart murmur    History of stroke    Hypothyroidism    OSA (obstructive sleep apnea)    Paroxysmal A-fib (HCC)    in EPIC careeverywhere   Past Surgical History:  Procedure Laterality Date   APPENDECTOMY     BUNIONECTOMY     CATARACT EXTRACTION     Right eye   CATARACT EXTRACTION W/PHACO Left 01/19/2021   Procedure: CATARACT EXTRACTION PHACO AND INTRAOCULAR LENS PLACEMENT (IOC) LEFT 3.09 00:28.3;  Surgeon: Nevada Crane, MD;  [facility]: [facility];  Service: Ophthalmology;  Laterality: Left;   COLONOSCOPY  07/06/2004   CORONARY ARTERY BYPASS GRAFT  2005   LOOP RECORDER INSERTION N/A 07/25/2018   Procedure: LOOP RECORDER INSERTION;  Surgeon: Regan Lemming, MD;  Location: [facility];  Service: Cardiovascular;  Laterality: N/A;   TOTAL HIP ARTHROPLASTY Right 11/23/2011   TOTAL HIP ARTHROPLASTY Left 09/07/2011   TOTAL KNEE ARTHROPLASTY Right 02/15/2018   VASECTOMY  1978   Family History  Problem Relation Age of Onset   Anxiety disorder Sister    Social History   Socioeconomic History   Marital status: Married    Spouse name: Not on file   Number of children: Not on file   Years of education: Not on file   Highest education level: Not on file  Occupational History   Occupation: retired  Tobacco Use   Smoking status: Never   Smokeless tobacco: Never  Vaping Use   Vaping status: Never Used  Substance and Sexual Activity   Alcohol use: Not Currently    Comment: past    Drug use: Never   Sexual activity: Not on file  Other Topics Concern   Not on file  Social History Narrative   Not on file   Social Determinants of Health   Financial Resource Strain: Low Risk  (10/22/2022)   Overall Financial Resource Strain (CARDIA)    Difficulty of Paying Living Expenses: Not hard at all  Food Insecurity: No Food Insecurity (10/22/2022)   Hunger Vital Sign    Worried About Running Out of Food in the Last Year: Never true    Ran Out of Food in the  Last Year: Never true  Transportation Needs: No Transportation Needs (10/22/2022)   PRAPARE - Administrator, Civil Service (Medical): No    Lack of Transportation (Non-Medical): No  Physical Activity: Sufficiently Active (10/22/2022)   Exercise Vital Sign    Days of Exercise per Week: 6 days    Minutes of Exercise per Session: 30 min  Stress: No Stress Concern Present (10/22/2022)   [employer] of Occupational Health - Occupational Stress Questionnaire    Feeling of Stress : Not at all  Social Connections: Moderately Integrated (10/22/2022)   Social Connection and Isolation Panel [NHANES]    Frequency of Communication with Friends and Family: Three times a week    Frequency of Social Gatherings with Friends and Family: More than three times a week    Attends Religious Services: More than 4 times per year    Active Member of [employer] or Organizations: No    Attends Engineer, structural: Never    Marital Status: Married    Tobacco Counseling Counseling given: Not Answered   Clinical Intake:  Pre-visit preparation completed: Yes  Pain : No/denies pain     Nutritional Status: BMI > 30  Obese Nutritional Risks: None Diabetes: No  How often do you need to have someone help you when you read instructions, pamphlets, or other written materials from your doctor or pharmacy?: 1 - Never  Interpreter Needed?: No  Information entered by :: Kennedy Bucker, LPN   Activities of Daily  Living    10/22/2022    3:14 PM 06/23/2022    2:50 PM  In your present state of health, do you have any difficulty performing the following activities:  Hearing? 0 1  Vision? 0 0  Difficulty concentrating or making decisions? 0 1  Walking or climbing stairs? 1 1  Dressing or bathing? 0 0  Doing errands, shopping? 0 0  Preparing Food and eating ? N   Using the Toilet? N   In the past six months, have you accidently leaked urine? N   Do you have problems with loss of bowel control? N   Managing your Medications? N   Managing your Finances? N   Housekeeping or managing your Housekeeping? N     Patient Care Team: [name], DO as PCP - General (Family Medicine) Mariah Milling Tollie Pizza, MD as PCP - Cardiology (Cardiology) Ronney Asters Jackelyn Poling, RPH-CPP as Pharmacist Arlana Pouch Megan Mans, RN as Case Manager (General Practice)  Indicate any recent Medical Services you may have received from other than [facility] providers in the past year (date may be approximate).     Assessment:   This is a routine wellness examination for Daelen.  Hearing/Vision screen Hearing Screening - Comments:: Wears aids, both ears Vision Screening - Comments:: Readers- Dr.King   Goals Addressed             This Visit's Progress    DIET - INCREASE WATER INTAKE         Depression Screen    10/22/2022    3:11 PM 06/23/2022    2:50 PM 12/29/2021    2:48 PM 09/25/2021    2:41 PM 07/03/2021    3:37 PM 10/16/2020    9:38 AM 07/29/2020    2:03 PM  PHQ 2/9 Scores  PHQ - 2 Score 0 0 3 1 0 0 0  PHQ- 9 Score 0 1 14 2  0 0     Fall Risk    10/22/2022  3:13 PM 06/23/2022    2:49 PM 04/23/2022    2:24 PM 03/05/2022    2:35 PM 12/29/2021    2:47 PM  Fall Risk   Falls in the past year? 1 1 1 1 1   Number falls in past yr: 1 0 1 1 1   Injury with Fall? 1 1 1  0 1  Risk for fall due to : History of fall(s);Impaired balance/gait;Other (Comment) History of fall(s) History of fall(s);Impaired  balance/gait;Impaired mobility History of fall(s);Impaired balance/gait History of fall(s);Impaired balance/gait  Risk for fall due to: Comment ear issues      Follow up Falls evaluation completed;Falls prevention discussed  Falls evaluation completed;Education provided;Falls prevention discussed Falls evaluation completed;Education provided;Falls prevention discussed Falls evaluation completed;Education provided;Falls prevention discussed    MEDICARE RISK AT HOME: Medicare Risk at Home Any stairs in or around the home?: Yes If so, are there any without handrails?: No Home free of loose throw rugs in walkways, pet beds, electrical cords, etc?: Yes Adequate lighting in your home to reduce risk of falls?: Yes Life alert?: No Use of a cane, walker or w/c?: Yes (cane occasionally) Grab bars in the bathroom?: Yes Shower chair or bench in shower?: Yes Elevated toilet seat or a handicapped toilet?: Yes  TIMED UP AND GO:  Was the test performed?  No    Cognitive Function:        10/22/2022    3:15 PM 09/25/2021    2:53 PM 07/29/2020    2:06 PM 07/17/2019   11:14 AM  6CIT Screen  What Year? 0 points 0 points 0 points 0 points  What month? 0 points 0 points 0 points 0 points  What time? 0 points 0 points 0 points 0 points  Count back from 20 0 points 0 points 0 points 0 points  Months in reverse 0 points 0 points 0 points 0 points  Repeat phrase 0 points 2 points 4 points 4 points  Total Score 0 points 2 points 4 points 4 points    Immunizations Immunization History  Administered Date(s) Administered   Fluad Quad(high Dose 65+) 10/16/2020, 09/25/2021   Hepatitis A, Adult 09/23/1996, 03/16/1997   Influenza Split 10/11/2016   Influenza Whole 10/27/1996, 11/02/1997, 12/24/1998, 10/31/1999, 11/13/2000   Influenza, High Dose Seasonal PF 09/05/2018   Influenza-Unspecified 09/25/2019, 10/15/2022   Moderna Covid-19 Vaccine Bivalent Booster 7yrs & up 10/20/2020   Moderna Sars-Covid-2  Vaccination 02/23/2019, 03/24/2019, 11/14/2019, 06/03/2020   OPV 02/11/1985   PPD Test 10/31/1999, 11/13/2000   Pneumococcal Conjugate-13 11/08/2014   Pneumococcal Polysaccharide-23 11/23/2000, 03/01/2008   Td 09/12/1994, 03/01/2008   Td (Adult), 2 Lf Tetanus Toxid, Preservative Free 09/12/1994   Tdap 10/08/2020   Typhoid Parenteral 09/23/1996   Typhoid Parenteral, AKD ([place] [employer]) 11/11/1993   Yellow Fever 01/12/1995   Zoster Recombinant(Shingrix) 06/25/2009, 07/24/2020    TDAP status: Up to date  Flu Vaccine status: Up to date  Pneumococcal vaccine status: Up to date  Covid-19 vaccine status: Completed vaccines  Qualifies for Shingles Vaccine? Yes   Zostavax completed No   Shingrix Completed?: Yes  Screening Tests Health Maintenance  Topic Date Due   Medicare Annual Wellness (AWV)  10/22/2023   DTaP/Tdap/Td (5 - Td or Tdap) 10/09/2030   Pneumonia Vaccine 37+ Years old  Completed   INFLUENZA VACCINE  Completed   Zoster Vaccines- Shingrix  Completed   HPV VACCINES  Aged Out   COVID-19 Vaccine  Discontinued    Health Maintenance  There are no preventive care  reminders to display for this patient.   Colorectal cancer screening: No longer required.   Lung Cancer Screening: (Low Dose CT Chest recommended if Age 31-80 years, 20 pack-year currently smoking OR have quit w/in 15years.) does not qualify.    Additional Screening:  Hepatitis C Screening: does not qualify; Completed no  Vision Screening: Recommended annual ophthalmology exams for early detection of glaucoma and other disorders of the eye. Is the patient up to date with their annual eye exam?  Yes  Who is the provider or what is the name of the office in which the patient attends annual eye exams? Dr.King If pt is not established with a provider, would they like to be referred to a provider to establish care? No .   Dental Screening: Recommended annual dental exams for proper oral hygiene   Community  Resource Referral / Chronic Care Management: CRR required this visit?  No   CCM required this visit?  No     Plan:     I have personally reviewed and noted the following in the patient's chart:   Medical and social history Use of alcohol, tobacco or illicit drugs  Current medications and supplements including opioid prescriptions. Patient is not currently taking opioid prescriptions. Functional ability and status Nutritional status Physical activity Advanced directives List of other physicians Hospitalizations, surgeries, and ER visits in previous 12 months Vitals Screenings to include cognitive, depression, and falls Referrals and appointments  In addition, I have reviewed and discussed with patient certain preventive protocols, quality metrics, and best practice recommendations. A written personalized care plan for preventive services as well as general preventive health recommendations were provided to patient.     Hal Hope, LPN   16/10/9602   After Visit Summary: my chart  Nurse Notes: none

## 2022-10-28 ENCOUNTER — Ambulatory Visit: Payer: Medicare PPO

## 2022-10-28 DIAGNOSIS — R42 Dizziness and giddiness: Secondary | ICD-10-CM | POA: Diagnosis not present

## 2022-10-28 DIAGNOSIS — R2689 Other abnormalities of gait and mobility: Secondary | ICD-10-CM

## 2022-10-28 DIAGNOSIS — R296 Repeated falls: Secondary | ICD-10-CM

## 2022-10-28 DIAGNOSIS — R2681 Unsteadiness on feet: Secondary | ICD-10-CM

## 2022-10-28 NOTE — Therapy (Signed)
OUTPATIENT PHYSICAL THERAPY VESTIBULAR TREATMENT NOTE   Patient Name: Luke Mckee MRN: [id number] DOB:09/29/1940, 82 y.o., male Today's Date: 10/28/2022  PCP: [name], DO  REFERRING PROVIDER: Providence Crosby, PA-C   END OF SESSION:  PT End of Session - 10/28/22 1106     Visit Number 5    Number of Visits 9    Date for PT Re-Evaluation 11/05/22    Authorization Type [employer]    Progress Note Due on Visit 10    PT Start Time 1107   Pt arrive late   PT Stop Time 1144    PT Time Calculation (min) 37 min    Equipment Utilized During Treatment Gait belt    Activity Tolerance Patient tolerated treatment well    Behavior During Therapy WFL for tasks assessed/performed             Past Medical History:  Diagnosis Date   Anxiety    Arthritis    CAD (coronary artery disease) 2005   s/p CABG, DES   CHF (congestive heart failure) (HCC)    in EPIC care everywhere   Chronic kidney disease    COPD (chronic obstructive pulmonary disease) (HCC)    in EPIC care everywhere   Depression    Heart murmur    History of stroke    Hypothyroidism    OSA (obstructive sleep apnea)    Paroxysmal A-fib (HCC)    in EPIC careeverywhere   Past Surgical History:  Procedure Laterality Date   APPENDECTOMY     BUNIONECTOMY     CATARACT EXTRACTION     Right eye   CATARACT EXTRACTION W/PHACO Left 01/19/2021   Procedure: CATARACT EXTRACTION PHACO AND INTRAOCULAR LENS PLACEMENT (IOC) LEFT 3.09 00:28.3;  Surgeon: Nevada Crane, MD;  Location: [facility];  Service: Ophthalmology;  Laterality: Left;   COLONOSCOPY  07/06/2004   CORONARY ARTERY BYPASS GRAFT  2005   LOOP RECORDER INSERTION N/A 07/25/2018   Procedure: LOOP RECORDER INSERTION;  Surgeon: Regan Lemming, MD;  Location: [facility];  Service: Cardiovascular;  Laterality: N/A;   TOTAL HIP ARTHROPLASTY Right 11/23/2011   TOTAL HIP ARTHROPLASTY Left 09/07/2011   TOTAL KNEE ARTHROPLASTY Right  02/15/2018   VASECTOMY  1978   Patient Active Problem List   Diagnosis Date Noted   Dizziness on standing 08/16/2022   Impaired fasting glucose 09/24/2021   Depression 09/24/2021   Sensorineural hearing loss, bilateral 09/24/2021   Hypothyroid 09/24/2021   Hyperlipidemia 09/24/2021   Sleep apnea 09/24/2021   Hallux limitus, acquired, right 11/12/2019   Pre-diabetes 10/17/2019   Recurrent major depressive disorder, in partial remission (HCC) 04/16/2019   Obesity (BMI 30.0-34.9) 04/16/2019   Vasomotor rhinitis 10/25/2018   Hearing loss 08/28/2018   Osteoarthritis 08/28/2018   Tinnitus 08/28/2018   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 (HCC) 07/31/2018   Slurred speech 07/23/2018   Right arm weakness 07/23/2018   Hypokalemia 07/23/2018   Anxiety 07/20/2018   Atherosclerosis of native coronary artery of native heart with stable angina pectoris (HCC) 06/16/2018   Hx of CABG 06/16/2018   Mixed hyperlipidemia 06/16/2018   Benign essential HTN 06/16/2018   History of cerebrovascular accident (CVA) with residual deficit 06/07/2018   Systolic CHF (HCC) 02/15/2018   Hypertension 02/15/2018   S/P total knee arthroplasty, right 02/15/2018   Primary osteoarthritis of both knees 11/25/2017   CAD (coronary artery disease) 11/12/2016   Abnormal nuclear stress test 06/30/2016   Hallux limitus  of right foot 10/22/2015   Anesthesia complication 10/10/2015   GERD (gastroesophageal reflux disease) 10/10/2015   Hypothyroidism 10/10/2015   Coronary artery disease involving native coronary artery of native heart with angina pectoris (HCC) 10/10/2015   OSA on CPAP 01/10/2007   Diagnosis unknown 01/12/2003    ONSET DATE: Referral Date: 08/13/2022 (Acute on Chronic Dizziness)  REFERRING DIAG: R42 (ICD-10-CM) - Dizziness on standing  THERAPY DIAG:  Dizziness and giddiness  Repeated falls  Unsteadiness on feet  Other abnormalities of gait and mobility  Rationale  for Evaluation and Treatment: Rehabilitation  PERTINENT HISTORY: Anxiety, Arthritis, CAD, CHF, CKD, COPD, Depression, Hx of CVA, Hypothyroidism, OSA, Paroxysmal A-Fib   PRECAUTIONS: None  SUBJECTIVE: Patient lost his exercises, requesting to have them printed. Continues to deny episodes of the dizziness. No falls or stumbles since last visit. Reports he is completing more activities around the house and confidence has increased.   PAIN:  Are you having pain? No   OBJECTIVE: TherEx:   Sit to Stands: Completed from standard height chair x 10 reps, initially using BUE support with verbal cues for controlled descent. Then progressed to no UE support x 10 reps. Progressed on HEP.   VESTIBULAR TREATMENT: Gaze Stabilization:  VOR x 1 Horizontal: standing  completed x 60 seconds,  cues for technique, range and speed of head movement. Completed x 2 trials. No dizziness reported, but mild blurred vision.  VOR x 1 Vertical: standing, completed x 60 seconds, cues for technique, range and speed of head movement. Completed x 2 trials. No dizziness reported, but mild blurred vision.  Standing Balance:   Standing March: standing with light UE support, completed alternating march to promote SLS. Progressed to completing alternating toe tap to 6" step to promote SLS x 15 reps.CGA and cues for pacing and foot placement.    Step Over Obstacles: with use of hurdle, completed anterior/posterior stepping strategy x 10 reps, frequent return to narrow BOS requiring cues to help facilitate improved balance. Then completed lateral step over x 10 reps each direction, mild unsteadiness. Use of single UE support from // bars. CGA, cues for pacing.   Standing Balance: Surface: Airex Position: Narrow Base of Support Completed with: Eyes Open; Head Turns x 10 Reps and Head Nods x 10 Reps. Completed x 2 sets. Then maintaining narrow BOS, completed eyes closed 3 x 30 seconds.   Tandem Stance: On firm surface, completed  partial tandem stance with eyes open, 2 x 30 seconds, increased postural sway. Cues for looking forward with completion.    Reviewed and Updated HEP (Bolded are new additions of 10/17):  Access Code: [email or web address] URL: [email or web address] Date: 10/28/2022 Prepared by: Nehemiah Settle Fairly  Exercises - Romberg Stance with Head Nods  - 2 x daily - 5 x weekly - 1 sets - 10 reps - Romberg Stance with Head Rotation  - 2 x daily - 5 x weekly - 1 sets - 10 reps - Romberg Stance with Eyes Closed  - 2 x daily - 5 x weekly - 1 sets - 30 seconds hold - Sit to Stand Without Arm Support  - 2 x daily - 5 x weekly - 1 sets - 10 reps - Standing March with Counter Support  - 1 x daily - 5 x weekly - 2 sets - 10 reps - Seated Gaze Stabilization with Head Rotation  - 1 x daily - 5 x weekly - 1 sets - 2 reps - 60 seconds hold  PATIENT EDUCATION: Education details:  Progress toward STGs; Continue HEP Person educated: Patient and Spouse Education method: Explanation Education comprehension: verbalized understanding   HOME EXERCISE PROGRAM: Access Code: [email or web address]   GOALS: Goals reviewed with patient? Yes   SHORT TERM GOALS: Target date: 10/08/2022   Pt will be independent with initial HEP for improved balance and vestibular input  Baseline: no HEP etablished; reports and demonstrates independence with current HEP Goal status: MET   2.  Pt will be able to hold situation 2 of M-CTSIB for >/= 25 seconds Baseline: 15 seconds; 30 seconds Goal status: MET   3.  Pt will be able to bend over to pick up object independent with </= 5/10 dizziness Baseline: currently unable to do; able to bend to pick up objects x 15 times with no dizziness Goal status: MET     LONG TERM GOALS: Target date: 11/05/2022   FOTO LTG to be set as applicable Baseline: TBA Goal status: INITIAL   2.  Pt will report </= 1/5 for all movements on MSQ to indicate improvement in motion sensitivity and improved activity tolerance.   Baseline: 2/5 Goal status: INITIAL   3.  Pt will be abe to ambulate >/= 100 ft with head turns and 90 deg body turns with </= 5/10 dizziness Baseline: unable without assist and significant dizziness Goal status: INITIAL   4.  Pt will be independent with final HEP for improved balance and vestibular input  Baseline: no HEP established  Goal status: INITIAL   ASSESSMENT:   CLINICAL IMPRESSION: Patient continues to present with minimal dizziness symptoms, report these have almost completely resolved. Unable to provoke dizziness this date, therefore continued focused on functional strengthening and standing balance activities. Patient tolerating well. Continue to require cues for improved BOS due to preference for narrow BOS. Updated HEP due to patient's progress.  Will continue to progress toward all LTGs.    OBJECTIVE IMPAIRMENTS: Abnormal gait, decreased activity tolerance, decreased balance, decreased mobility, difficulty walking, decreased strength, decreased safety awareness, and dizziness.    ACTIVITY LIMITATIONS: bending, sitting, standing, transfers, bed mobility, and reach over head   PARTICIPATION LIMITATIONS: driving, community activity, and yard work   PERSONAL FACTORS: Age, Time since onset of injury/illness/exacerbation, and 3+ comorbidities: Anxiety, Arthritis, CAD, CHF, CKD, COPD, Depression, Hx of CVA, Hypothyroidism, OSA, Paroxysmal A-Fib   are also affecting patient's functional outcome.    REHAB POTENTIAL: Good   CLINICAL DECISION MAKING: Evolving/moderate complexity   EVALUATION COMPLEXITY: Moderate     PLAN:   PT FREQUENCY: 1x/week   PT DURATION: 8 weeks   PLANNED INTERVENTIONS: Therapeutic exercises, Therapeutic activity, Neuromuscular re-education, Balance training, Gait training, Patient/Family education, Self Care, Joint mobilization, Stair training, Vestibular training, Canalith repositioning, DME instructions, Cryotherapy, Moist heat, Manual therapy,  and Re-evaluation   PLAN FOR NEXT SESSION: Continue VOR, Habituation to Bending, Standing Balance, Dynamic Gait Activities    Howie Ill, PT, DPT 10/28/22 11:46 AM

## 2022-11-04 ENCOUNTER — Ambulatory Visit: Payer: Medicare PPO

## 2022-11-04 DIAGNOSIS — R2689 Other abnormalities of gait and mobility: Secondary | ICD-10-CM | POA: Diagnosis not present

## 2022-11-04 DIAGNOSIS — R42 Dizziness and giddiness: Secondary | ICD-10-CM

## 2022-11-04 DIAGNOSIS — H35071 Retinal telangiectasis, right eye: Secondary | ICD-10-CM | POA: Diagnosis not present

## 2022-11-04 DIAGNOSIS — R296 Repeated falls: Secondary | ICD-10-CM

## 2022-11-04 DIAGNOSIS — R2681 Unsteadiness on feet: Secondary | ICD-10-CM

## 2022-11-04 NOTE — Therapy (Signed)
OUTPATIENT PHYSICAL THERAPY VESTIBULAR TREATMENT NOTE/RE-CERTIFICATION   Patient Name: Luke Mckee MRN: [id number] DOB:1940/08/29, 82 y.o., male Today's Date: 11/04/2022  PCP: [name], DO  REFERRING PROVIDER: Providence Crosby, PA-C   END OF SESSION:  PT End of Session - 11/04/22 1112     Visit Number 6    Number of Visits 12    Date for PT Re-Evaluation 12/24/22    Authorization Type [employer]    Progress Note Due on Visit 10    PT Start Time 1110   Pt arrived late   PT Stop Time 1145    PT Time Calculation (min) 35 min    Equipment Utilized During Treatment Gait belt    Activity Tolerance Patient tolerated treatment well    Behavior During Therapy WFL for tasks assessed/performed             Past Medical History:  Diagnosis Date   Anxiety    Arthritis    CAD (coronary artery disease) 2005   s/p CABG, DES   CHF (congestive heart failure) (HCC)    in EPIC care everywhere   Chronic kidney disease    COPD (chronic obstructive pulmonary disease) (HCC)    in EPIC care everywhere   Depression    Heart murmur    History of stroke    Hypothyroidism    OSA (obstructive sleep apnea)    Paroxysmal A-fib (HCC)    in EPIC careeverywhere   Past Surgical History:  Procedure Laterality Date   APPENDECTOMY     BUNIONECTOMY     CATARACT EXTRACTION     Right eye   CATARACT EXTRACTION W/PHACO Left 01/19/2021   Procedure: CATARACT EXTRACTION PHACO AND INTRAOCULAR LENS PLACEMENT (IOC) LEFT 3.09 00:28.3;  Surgeon: Nevada Crane, MD;  Location: [facility];  Service: Ophthalmology;  Laterality: Left;   COLONOSCOPY  07/06/2004   CORONARY ARTERY BYPASS GRAFT  2005   LOOP RECORDER INSERTION N/A 07/25/2018   Procedure: LOOP RECORDER INSERTION;  Surgeon: Regan Lemming, MD;  Location: [facility];  Service: Cardiovascular;  Laterality: N/A;   TOTAL HIP ARTHROPLASTY Right 11/23/2011   TOTAL HIP ARTHROPLASTY Left 09/07/2011   TOTAL KNEE  ARTHROPLASTY Right 02/15/2018   VASECTOMY  1978   Patient Active Problem List   Diagnosis Date Noted   Dizziness on standing 08/16/2022   Impaired fasting glucose 09/24/2021   Depression 09/24/2021   Sensorineural hearing loss, bilateral 09/24/2021   Hypothyroid 09/24/2021   Hyperlipidemia 09/24/2021   Sleep apnea 09/24/2021   Hallux limitus, acquired, right 11/12/2019   Pre-diabetes 10/17/2019   Recurrent major depressive disorder, in partial remission (HCC) 04/16/2019   Obesity (BMI 30.0-34.9) 04/16/2019   Vasomotor rhinitis 10/25/2018   Hearing loss 08/28/2018   Osteoarthritis 08/28/2018   Tinnitus 08/28/2018   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 (HCC) 07/31/2018   Slurred speech 07/23/2018   Right arm weakness 07/23/2018   Hypokalemia 07/23/2018   Anxiety 07/20/2018   Atherosclerosis of native coronary artery of native heart with stable angina pectoris (HCC) 06/16/2018   Hx of CABG 06/16/2018   Mixed hyperlipidemia 06/16/2018   Benign essential HTN 06/16/2018   History of cerebrovascular accident (CVA) with residual deficit 06/07/2018   Systolic CHF (HCC) 02/15/2018   Hypertension 02/15/2018   S/P total knee arthroplasty, right 02/15/2018   Primary osteoarthritis of both knees 11/25/2017   CAD (coronary artery disease) 11/12/2016   Abnormal nuclear stress test 06/30/2016   Hallux limitus  of right foot 10/22/2015   Anesthesia complication 10/10/2015   GERD (gastroesophageal reflux disease) 10/10/2015   Hypothyroidism 10/10/2015   Coronary artery disease involving native coronary artery of native heart with angina pectoris (HCC) 10/10/2015   OSA on CPAP 01/10/2007   Diagnosis unknown 01/12/2003    ONSET DATE: Referral Date: 08/13/2022 (Acute on Chronic Dizziness)  REFERRING DIAG: R42 (ICD-10-CM) - Dizziness on standing  THERAPY DIAG:  Dizziness and giddiness  Repeated falls  Unsteadiness on feet  Other abnormalities of gait and  mobility  Rationale for Evaluation and Treatment: Rehabilitation  PERTINENT HISTORY: Anxiety, Arthritis, CAD, CHF, CKD, COPD, Depression, Hx of CVA, Hypothyroidism, OSA, Paroxysmal A-Fib   PRECAUTIONS: None  SUBJECTIVE: Patient reports no episodes of dizziness. No falls. No new changes. Feet are a little sore.    PAIN:  Are you having pain? No   OBJECTIVE: Vestibular FOTO: 62%   Motion Sensitivity Quotient  Intensity: 0 = none, 1 = Lightheaded, 2 = Mild, 3 = Moderate, 4 = Severe, 5 = Vomiting  Intensity  1. Sitting to supine 0  2. Supine to L side 0  3. Supine to R side 0  4. Supine to sitting 0  5. L Hallpike-Dix -  6. Up from L  -  7. R Hallpike-Dix -  8. Up from R  -  9. Sitting, head  tipped to L knee 0  10. Head up from L  knee 0  11. Sitting, head  tipped to R knee 0  12. Head up from R  knee 0  13. Sitting head turns x5 0  14.Sitting head nods x5 0  15. In stance, 180  turn to L  0  16. In stance, 180  turn to R 0   TUG: 9.06 seconds without use of AD  10 Meter Walk Test: 9.01 seconds = 1.11 m/s  30 second Chair Test: 11 sit <> stands from standard height chair with UE support.   M-CTSIB:   Situation 1: 30 seconds  Situation 2: 30 seconds  Situation 3: 30 seconds  Situation 4: 7-8 seconds on average (increased postural sway; no dizziness)    GAIT: Gait pattern: step through pattern and wide BOS Distance walked: 150'  Assistive device utilized: None Level of assistance: SBA Comments: with addition of horizontal/vertical head turns and body turns; mild unsteadiness requiring close supervision to CGA. No dizziness reported this date. DGI will need to be assessed next session to further assess dynamic gait activities   VESTIBULAR TREATMENT:   Reviewed HEP with patient: Access Code: [email or web address] URL: [email or web address] Date: 10/28/2022 Prepared by: Nehemiah Settle Fairly  Exercises - Romberg Stance with Head Nods  - 2 x daily - 5 x  weekly - 1 sets - 10 reps - Romberg Stance with Head Rotation  - 2 x daily - 5 x weekly - 1 sets - 10 reps - Romberg Stance with Eyes Closed  - 2 x daily - 5 x weekly - 1 sets - 30 seconds hold - Sit to Stand Without Arm Support  - 2 x daily - 5 x weekly - 1 sets - 10 reps - Standing March with Counter Support  - 1 x daily - 5 x weekly - 2 sets - 10 reps - Seated Gaze Stabilization with Head Rotation  - 1 x daily - 5 x weekly - 1 sets - 2 reps - 60 seconds hold  PATIENT EDUCATION: Education details: Progress toward STGs; Continue HEP Person educated: Patient and Spouse  Education method: Explanation Education comprehension: verbalized understanding   HOME EXERCISE PROGRAM: Access Code: [email or web address]   GOALS: Goals reviewed with patient? Yes   SHORT TERM GOALS: Target date: 10/08/2022   Pt will be independent with initial HEP for improved balance and vestibular input  Baseline: no HEP etablished; reports and demonstrates independence with current HEP Goal status: MET   2.  Pt will be able to hold situation 2 of M-CTSIB for >/= 25 seconds Baseline: 15 seconds; 30 seconds Goal status: MET   3.  Pt will be able to bend over to pick up object independent with </= 5/10 dizziness Baseline: currently unable to do; able to bend to pick up objects x 15 times with no dizziness Goal status: MET     LONG TERM GOALS: Target date: 11/05/2022   FOTO LTG to be set as applicable Baseline: TBA; 62% Goal status: Deferred; LTG never updated    2.  Pt will report </= 1/5 for all movements on MSQ to indicate improvement in motion sensitivity and improved activity tolerance.  Baseline: 2/5; 0/5 on all components Goal status: MET   3.  Pt will be abe to ambulate >/= 100 ft with head turns and 90 deg body turns with </= 5/10 dizziness Baseline: unable without assist and significant dizziness; patient able to complete without dizziness, still some mild unsteadiness Goal status: Partially MET   4.  Pt  will be independent with final HEP for improved balance and vestibular input  Baseline: no HEP established; reports IND with vestibular HEP Goal status: MET   UPDATED GOALS:    SHORT TERM GOALS: Target date: 11/26/2022   1.  Pt will be able to hold situation 4 of M-CTSIB for >/= 10 seconds Baseline: 7-8 seconds Goal status: INITIAL     LONG TERM GOALS: Target date: 12/24/2022   Pt will improve gait speed to >/= 1.2 m/sec to demonstrate improved community ambulation Baseline: 1.1 m/s Goal status: INITIAL   2.  Pt will improve 30 second STS test >/= 13 reps to demo improved functional LE strength and balance  Baseline: 11 reps Goal status: INITIAL    3. Pt will be able to hold situation 4 of M-CTSIB for >/= 15 seconds Baseline: 7-8 seconds Goal status: INITIAL   4.  Pt will be independent with final HEP for dynamic gait and balance to reduce fall risk.  Baseline: IND with vestibular HEP; progress balance HEP Goal status: INITIAL   5.  Pt will improve DGI by 3 points from baseline to demonstrate improved balance and reduced fall risk Baseline: TBA Goal status: INITIAL     ASSESSMENT:   CLINICAL IMPRESSION: Today's skilled PT session focused on assessment of patient's progress toward LTGs. Patient able to meet or partially meet all LTGs, demonstrating improved dizziness with functional movements and with head/body turns as noted on MSQ. Patient also scored 62% on Vestibular FOTO demonstrating minimal dizziness, showing significant improvements from initial evaluation. Patient however continues to demonstrate some imbalance noted with gait and dynamic balance. Patient is currently ambulating at 1.1 m/s without AD, and able to complete 11 sit to stands with 30 second chair stand test. Pt will benefit from further balance testing with DGI at next session to assess dynamic gait. Patient has made significant progress regarding dizziness, will benefit from further skilled PT sessions to  address residual balance impairments to reduce fall risk and maximize functional mobility.    OBJECTIVE IMPAIRMENTS: Abnormal gait, decreased activity tolerance, decreased balance,  decreased mobility, difficulty walking, decreased strength, decreased safety awareness, and dizziness.    ACTIVITY LIMITATIONS: bending, sitting, standing, transfers, bed mobility, and reach over head   PARTICIPATION LIMITATIONS: driving, community activity, and yard work   PERSONAL FACTORS: Age, Time since onset of injury/illness/exacerbation, and 3+ comorbidities: Anxiety, Arthritis, CAD, CHF, CKD, COPD, Depression, Hx of CVA, Hypothyroidism, OSA, Paroxysmal A-Fib   are also affecting patient's functional outcome.    REHAB POTENTIAL: Good   CLINICAL DECISION MAKING: Evolving/moderate complexity   EVALUATION COMPLEXITY: Moderate     PLAN:   PT FREQUENCY: 1x/week   PT DURATION: 8 weeks   PLANNED INTERVENTIONS: Therapeutic exercises, Therapeutic activity, Neuromuscular re-education, Balance training, Gait training, Patient/Family education, Self Care, Joint mobilization, Stair training, Vestibular training, Canalith repositioning, DME instructions, Cryotherapy, Moist heat, Manual therapy, and Re-evaluation   PLAN FOR NEXT SESSION: Assess DGI, Standing Balance, Dynamic Gait Activities    Howie Ill, PT, DPT 11/04/22 12:06 PM

## 2022-11-07 NOTE — Progress Notes (Unsigned)
Date:  11/08/2022   ID:  Luke Mckee, DOB Nov 14, 1940, MRN [id number]  Patient Location:  [address] [place] [address]   Provider location:   [facility], [place] office  PCP:  Luke Cords, DO  Cardiologist:  Luke Mckee  Chief Complaint  Patient presents with   3 month follow up     "Doing well." Medications reviewed by the patient verbally.     History of Present Illness:    Luke Mckee is a 82 y.o. male  past medical history of CAD s/p CABG x 4 in [place] 2005, and stent placement x 2 in past 2-3 years, Anxiety HTN prior CVA identified on outside imaging in [place] 04/2018,  Hx of lacunar stroke , Second CVA 2021 Hospital 5 days, rehab 6 days Right side affected,  Loop monitor, no atrial fibrillation Ejection fraction 50% Who presents for coronary artery disease, chronic dizziness  Last seen by myself in clinic August 2023 Seen by one of our providers July 2024  Has monthly loop recorder downloads, placed for history of stroke Downloads reviewed, no significant arrhythmia, normal histograms  Seen in emergency room on July 29, 2018 for near syncope Concern for orthostasis on that visit In the ER, atenolol was cut in half down to 25 daily Also given meclizine, refer to ENT  Has been working with vestibular PT Gets dizzy when blowing nose Working on his balance Seen by ENT  Has a exercise program at home Mild gait instability  Lab work reviewed A1C 6.2 Total cholesterol 127 LDL 62  Other past medical history reviewed Cath possibly >2 years ago, stent x 1 Prior cath >3 years , stent x 1  Echocardiogram July 2020, EF 50%, mildly decreased RV function   Past Medical History:  Diagnosis Date   Anxiety    Arthritis    CAD (coronary artery disease) 2005   s/p CABG, DES   CHF (congestive heart failure) (HCC)    in EPIC care everywhere   Chronic kidney disease    COPD (chronic obstructive pulmonary disease)  (HCC)    in EPIC care everywhere   Depression    Heart murmur    History of stroke    Hypothyroidism    OSA (obstructive sleep apnea)    Paroxysmal A-fib (HCC)    in EPIC careeverywhere   Past Surgical History:  Procedure Laterality Date   APPENDECTOMY     BUNIONECTOMY     CATARACT EXTRACTION     Right eye   CATARACT EXTRACTION W/PHACO Left 01/19/2021   Procedure: CATARACT EXTRACTION PHACO AND INTRAOCULAR LENS PLACEMENT (IOC) LEFT 3.09 00:28.3;  Surgeon: Nevada Crane, MD;  Location: [facility];  Service: Ophthalmology;  Laterality: Left;   COLONOSCOPY  07/06/2004   CORONARY ARTERY BYPASS GRAFT  2005   LOOP RECORDER INSERTION N/A 07/25/2018   Procedure: LOOP RECORDER INSERTION;  Surgeon: Regan Lemming, MD;  Location: [facility];  Service: Cardiovascular;  Laterality: N/A;   TOTAL HIP ARTHROPLASTY Right 11/23/2011   TOTAL HIP ARTHROPLASTY Left 09/07/2011   TOTAL KNEE ARTHROPLASTY Right 02/15/2018   VASECTOMY  1978     Current Meds  Medication Sig   acetaminophen (TYLENOL) 650 MG CR tablet Take 1,300 mg by mouth at bedtime as needed.   albuterol (VENTOLIN HFA) 108 (90 Base) MCG/ACT inhaler Inhale 1-2 puffs into the lungs every 4 (four) hours as needed for wheezing or shortness of breath (  cough).   aspirin EC 81 MG tablet Take 81 mg by mouth daily.   atenolol (TENORMIN) 50 MG tablet Take 25 mg by mouth daily.   atorvastatin (LIPITOR) 80 MG tablet Take 1 tablet (80 mg total) by mouth daily.   Calcium Carb-Cholecalciferol (CALCIUM 600 + D PO) Take 1 tablet by mouth daily.   Cholecalciferol (VITAMIN D) 50 MCG (2000 UT) tablet Take 2,000 Units by mouth daily.   clopidogrel (PLAVIX) 75 MG tablet Take 1 tablet (75 mg total) by mouth daily.   clotrimazole-betamethasone (LOTRISONE) cream Apply twice a day for 1-2 weeks, may repeat if need in future   co-enzyme Q-10 30 MG capsule Take 100 mg by mouth daily.   colchicine 0.6 MG tablet For acute gout flare take 2  pills at once, then take a 3rd pill 2 hours later. Then take 1 pill daily for 7-10 days or until resolved.   diclofenac sodium (VOLTAREN) 1 % GEL Apply 2 g topically 4 (four) times daily as needed for pain. Foot pain   escitalopram (LEXAPRO) 10 MG tablet Take 20 mg by mouth daily. Has transitioned off of the Zoloft and is now taking Lexapro 20 mg daily   furosemide (LASIX) 80 MG tablet TAKE 1 TABLET(80 MG) BY MOUTH TWICE DAILY   ipratropium (ATROVENT) 0.06 % nasal spray Place 2 sprays into both nostrils 4 (four) times daily as needed for rhinitis.   levothyroxine (SYNTHROID) 125 MCG tablet TAKE 1 TABLET(125 MCG) BY MOUTH DAILY BEFORE BREAKFAST   loratadine (CLARITIN) 10 MG tablet Take 10 mg by mouth daily.   LORazepam (ATIVAN) 0.5 MG tablet Take 1 tablet (0.5 mg total) by mouth daily as needed for anxiety or sleep.   meclizine (ANTIVERT) 12.5 MG tablet Take 1 tablet (12.5 mg total) by mouth 3 (three) times daily as needed for dizziness.   montelukast (SINGULAIR) 10 MG tablet Take 1 tablet (10 mg total) by mouth at bedtime.   Multiple Vitamin (MULTIVITAMIN) tablet Take 1 tablet by mouth daily.   nitroGLYCERIN (NITROSTAT) 0.4 MG SL tablet Place 1 tablet (0.4 mg total) under the tongue every 5 (five) minutes as needed for chest pain.   PATADAY 0.1 % ophthalmic solution Place into both eyes as needed.    sennosides-docusate sodium (SENOKOT-S) 8.6-50 MG tablet Take 1-2 tablets by mouth daily as needed for constipation.   traZODone (DESYREL) 50 MG tablet Take 1-2 tablets (50-100 mg total) by mouth at bedtime.     Allergies:   Ace inhibitors, Cephalexin, Crestor [rosuvastatin calcium], and Tape   Social History   Tobacco Use   Smoking status: Never   Smokeless tobacco: Never  Vaping Use   Vaping status: Never Used  Substance Use Topics   Alcohol use: Not Currently    Comment: past   Drug use: Never     Family Hx: The patient's family history includes Anxiety disorder in his sister.  ROS:    Please see the history of present illness.    Review of Systems  Constitutional: Negative.   HENT: Negative.    Respiratory: Negative.    Cardiovascular: Negative.   Gastrointestinal: Negative.   Musculoskeletal: Negative.   Neurological:  Positive for dizziness and headaches.  Psychiatric/Behavioral: Negative.    All other systems reviewed and are negative.    Labs/Other Tests and Data Reviewed:    Recent Labs: 12/22/2021: TSH 3.67 07/29/2022: ALT 21; B Natriuretic Peptide 71.6; BUN 20; Creatinine, Ser 1.07; Hemoglobin 14.1; Platelets 230; Potassium 3.8; Sodium 135  Recent Lipid Panel Lab Results  Component Value Date/Time   CHOL 127 12/22/2021 08:26 AM   TRIG 143 12/22/2021 08:26 AM   HDL 42 12/22/2021 08:26 AM   CHOLHDL 3.0 12/22/2021 08:26 AM   LDLCALC 62 12/22/2021 08:26 AM    Wt Readings from Last 3 Encounters:  11/08/22 212 lb 4 oz (96.3 kg)  10/22/22 216 lb 12.8 oz (98.3 kg)  08/13/22 215 lb (97.5 kg)     Exam:    Vital Signs: Vital signs may also be detailed in the HPI BP 100/70 (BP Location: Left Arm, Patient Position: Sitting, Cuff Size: Normal)   Pulse 69   Ht 5\' 8"  (1.727 m)   Wt 212 lb 4 oz (96.3 kg)   SpO2 97%   BMI 32.27 kg/m   Constitutional:  oriented to person, place, and time. No distress.  Presenting in a wheelchair HENT:  Head: Grossly normal Eyes:  no discharge. No scleral icterus.  Neck: No JVD, no carotid bruits  Cardiovascular: Regular rate and rhythm, no murmurs appreciated Pulmonary/Chest: Clear to auscultation bilaterally, no wheezes or rails Abdominal: Soft.  no distension.  no tenderness.  Musculoskeletal: Normal range of motion Neurological:  normal muscle tone. Coordination normal. No atrophy Skin: Skin warm and dry Psychiatric: normal affect, pleasant  ASSESSMENT & PLAN:    Atherosclerosis of native coronary artery of native heart with stable angina pectoris (HCC) On aspirin Plavix (in light of prior strokes) Currently  with no symptoms of angina. No further workup at this time. Continue current medication regimen.  Hx of CABG No new anginal symptoms,  Lipids at goal  Mixed hyperlipidemia Cholesterol is at goal on the current lipid regimen. No changes to the medications were made.  Benign essential HTN Blood pressure borderline low, recheck Reports atenolol was decreased down to 25 daily while in the ER over the summer July 2024 Taking Lasix 80 twice daily All other medications held including isosorbide, spironolactone We have recommended he hold afternoon Lasix 3 days a week Monitor orthostatics at home and call us if numbers do not improve  History of cerebrovascular accident (CVA) with residual deficit Recommended regular walking program for conditioning, weight loss On aspirin Plavix, cholesterol at goal, no significant arrhythmia on loop monitor download    Signed, Julien Nordmann, MD  11/08/2022 2:28 PM    [facility] [address], [place], [place] [address]

## 2022-11-08 ENCOUNTER — Encounter: Payer: Self-pay | Admitting: Cardiovascular Disease

## 2022-11-08 ENCOUNTER — Ambulatory Visit: Payer: Medicare PPO | Attending: Cardiovascular Disease | Admitting: Cardiovascular Disease

## 2022-11-08 VITALS — BP 100/70 | HR 69 | Ht 68.0 in | Wt 212.2 lb

## 2022-11-08 DIAGNOSIS — Z951 Presence of aortocoronary bypass graft: Secondary | ICD-10-CM | POA: Diagnosis not present

## 2022-11-08 DIAGNOSIS — I2510 Atherosclerotic heart disease of native coronary artery without angina pectoris: Secondary | ICD-10-CM | POA: Diagnosis not present

## 2022-11-08 DIAGNOSIS — E782 Mixed hyperlipidemia: Secondary | ICD-10-CM | POA: Diagnosis not present

## 2022-11-08 DIAGNOSIS — I951 Orthostatic hypotension: Secondary | ICD-10-CM

## 2022-11-08 DIAGNOSIS — R42 Dizziness and giddiness: Secondary | ICD-10-CM

## 2022-11-08 DIAGNOSIS — G459 Transient cerebral ischemic attack, unspecified: Secondary | ICD-10-CM | POA: Diagnosis not present

## 2022-11-08 DIAGNOSIS — I10 Essential (primary) hypertension: Secondary | ICD-10-CM

## 2022-11-08 DIAGNOSIS — I63412 Cerebral infarction due to embolism of left middle cerebral artery: Secondary | ICD-10-CM | POA: Diagnosis not present

## 2022-11-08 NOTE — Patient Instructions (Signed)
Medication Instructions:  Please hold the afternoon lasix three days a week   If you need a refill on your cardiac medications before your next appointment, please call your pharmacy.   Lab work: No new labs needed  Testing/Procedures: No new testing needed  Follow-Up: At [facility], you and your health needs are our priority.  As part of our continuing mission to provide you with exceptional heart care, we have created designated Provider Care Teams.  These Care Teams include your primary Cardiologist (physician) and Advanced Practice Providers (APPs -  Physician Assistants and Nurse Practitioners) who all work together to provide you with the care you need, when you need it.  You will need a follow up appointment in 12 months  Providers on your designated Care Team:   Nicolasa Ducking, NP Eula Listen, PA-C Cadence Fransico Michael, [place]  COVID-19 Vaccine Information can be found at: [email or web address] For questions related to vaccine distribution or appointments, please email [email or web address] or call [phone].

## 2022-11-10 DIAGNOSIS — G4733 Obstructive sleep apnea (adult) (pediatric): Secondary | ICD-10-CM | POA: Diagnosis not present

## 2022-11-11 ENCOUNTER — Ambulatory Visit: Payer: Medicare PPO

## 2022-11-11 DIAGNOSIS — R2681 Unsteadiness on feet: Secondary | ICD-10-CM

## 2022-11-11 DIAGNOSIS — R2689 Other abnormalities of gait and mobility: Secondary | ICD-10-CM | POA: Diagnosis not present

## 2022-11-11 DIAGNOSIS — R42 Dizziness and giddiness: Secondary | ICD-10-CM

## 2022-11-11 DIAGNOSIS — R296 Repeated falls: Secondary | ICD-10-CM | POA: Diagnosis not present

## 2022-11-11 NOTE — Therapy (Signed)
OUTPATIENT PHYSICAL THERAPY VESTIBULAR TREATMENT NOTE   Patient Name: Luke Mckee MRN: [id number] DOB:1940-08-21, 82 y.o., male Today's Date: 11/11/2022  PCP: [name], DO  REFERRING PROVIDER: Providence Crosby, PA-C   END OF SESSION:  PT End of Session - 11/11/22 1104     Visit Number 7    Number of Visits 12    Date for PT Re-Evaluation 12/24/22    Authorization Type [employer]    Progress Note Due on Visit 10    PT Start Time 1105   Pt arrived late   PT Stop Time 1144    PT Time Calculation (min) 39 min    Equipment Utilized During Treatment Gait belt    Activity Tolerance Patient tolerated treatment well    Behavior During Therapy WFL for tasks assessed/performed             Past Medical History:  Diagnosis Date   Anxiety    Arthritis    CAD (coronary artery disease) 2005   s/p CABG, DES   CHF (congestive heart failure) (HCC)    in EPIC care everywhere   Chronic kidney disease    COPD (chronic obstructive pulmonary disease) (HCC)    in EPIC care everywhere   Depression    Heart murmur    History of stroke    Hypothyroidism    OSA (obstructive sleep apnea)    Paroxysmal A-fib (HCC)    in EPIC careeverywhere   Past Surgical History:  Procedure Laterality Date   APPENDECTOMY     BUNIONECTOMY     CATARACT EXTRACTION     Right eye   CATARACT EXTRACTION W/PHACO Left 01/19/2021   Procedure: CATARACT EXTRACTION PHACO AND INTRAOCULAR LENS PLACEMENT (IOC) LEFT 3.09 00:28.3;  Surgeon: Nevada Crane, MD;  Location: [facility];  Service: Ophthalmology;  Laterality: Left;   COLONOSCOPY  07/06/2004   CORONARY ARTERY BYPASS GRAFT  2005   LOOP RECORDER INSERTION N/A 07/25/2018   Procedure: LOOP RECORDER INSERTION;  Surgeon: Regan Lemming, MD;  Location: [facility];  Service: Cardiovascular;  Laterality: N/A;   TOTAL HIP ARTHROPLASTY Right 11/23/2011   TOTAL HIP ARTHROPLASTY Left 09/07/2011   TOTAL KNEE ARTHROPLASTY Right  02/15/2018   VASECTOMY  1978   Patient Active Problem List   Diagnosis Date Noted   Dizziness on standing 08/16/2022   Impaired fasting glucose 09/24/2021   Depression 09/24/2021   Sensorineural hearing loss, bilateral 09/24/2021   Hypothyroid 09/24/2021   Hyperlipidemia 09/24/2021   Sleep apnea 09/24/2021   Hallux limitus, acquired, right 11/12/2019   Pre-diabetes 10/17/2019   Recurrent major depressive disorder, in partial remission (HCC) 04/16/2019   Obesity (BMI 30.0-34.9) 04/16/2019   Vasomotor rhinitis 10/25/2018   Hearing loss 08/28/2018   Osteoarthritis 08/28/2018   Tinnitus 08/28/2018   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 (HCC) 07/31/2018   Slurred speech 07/23/2018   Right arm weakness 07/23/2018   Hypokalemia 07/23/2018   Anxiety 07/20/2018   Atherosclerosis of native coronary artery of native heart with stable angina pectoris (HCC) 06/16/2018   Hx of CABG 06/16/2018   Mixed hyperlipidemia 06/16/2018   Benign essential HTN 06/16/2018   History of cerebrovascular accident (CVA) with residual deficit 06/07/2018   Systolic CHF (HCC) 02/15/2018   Hypertension 02/15/2018   S/P total knee arthroplasty, right 02/15/2018   Primary osteoarthritis of both knees 11/25/2017   CAD (coronary artery disease) 11/12/2016   Abnormal nuclear stress test 06/30/2016   Hallux limitus  of right foot 10/22/2015   Anesthesia complication 10/10/2015   GERD (gastroesophageal reflux disease) 10/10/2015   Hypothyroidism 10/10/2015   Coronary artery disease involving native coronary artery of native heart with angina pectoris (HCC) 10/10/2015   OSA on CPAP 01/10/2007   Diagnosis unknown 01/12/2003    ONSET DATE: Referral Date: 08/13/2022 (Acute on Chronic Dizziness)  REFERRING DIAG: R42 (ICD-10-CM) - Dizziness on standing  THERAPY DIAG:  Dizziness and giddiness  Repeated falls  Unsteadiness on feet  Other abnormalities of gait and mobility  Rationale  for Evaluation and Treatment: Rehabilitation  PERTINENT HISTORY: Anxiety, Arthritis, CAD, CHF, CKD, COPD, Depression, Hx of CVA, Hypothyroidism, OSA, Paroxysmal A-Fib   PRECAUTIONS: None  SUBJECTIVE: Patient reports some pressure in the ear, but resolved this morning. No other new changes. No falls.   PAIN:  Are you having pain? No  Vitals: 102/68, HR: 62 \ OBJECTIVE:  [facility] PT Assessment - 11/11/22 0001       Standardized Balance Assessment   Standardized Balance Assessment Dynamic Gait Index      Dynamic Gait Index   Level Surface Mild Impairment    Change in Gait Speed Mild Impairment    Gait with Horizontal Head Turns Moderate Impairment    Gait with Vertical Head Turns Moderate Impairment    Gait and Pivot Turn Mild Impairment    Step Over Obstacle Mild Impairment    Step Around Obstacles Normal    Steps Mild Impairment    Total Score 15    DGI comment: 15/24: increased fall risk            GAIT: Gait pattern: step through pattern and wide BOS Distance walked: around clinic with DGI/balance activities  Assistive device utilized: None Level of assistance: SBA Comments: mild unsteadiness noted with dynamic balance activities with ambulation.   VESTIBULAR TREATMENT:   Standing on Airex:  Feet Hip Width with Eyes Open: Completed horizontal/vertical head turns x 10 reps, completed x 2 sets. Increased postural sway with horizontal > vertical.  Eyes Closed with Feet Hip Width: Completed feet hip with eyes closed, 3 x 30 seconds. Increased postural sway noted, requiring verbal cues for correction and ankle strategy.    Alternating Toe Tap: with 6" step completed alternating forward toe taps to step, x 15 reps bilaterally with single UE support progressing to no UE support. Then progressed to crossover toe taps x 15 reps bilaterally with single UE support throughout. Close supervision.   Standing with one feet on 6" step for strengthening/balance, with opposite foot  posterior on steady surface, completed static balance working on proximal hip strengthening/balance, 2 x 30 seconds each. Weakness noted in RLE > LLE.    Stepping over obstacles: completed lateral stepping over bolsters, completed x 4 reps with single UE support from // bars. Cues required for technique for safety. Cues for pacing/step length. Then progressed to reciprocal stepping over bolster x 3 reps. Mild unsteadiness require close supervision.    PATIENT EDUCATION: Education details: Progress toward STGs; Continue HEP Person educated: Patient and Spouse Education method: Explanation Education comprehension: verbalized understanding   HOME EXERCISE PROGRAM: Access Code: [email or web address]   GOALS: Goals reviewed with patient? Yes     SHORT TERM GOALS: Target date: 11/26/2022   1.  Pt will be able to hold situation 4 of M-CTSIB for >/= 10 seconds Baseline: 7-8 seconds Goal status: INITIAL     LONG TERM GOALS: Target date: 12/24/2022   Pt will improve gait speed to >/= 1.2  m/sec to demonstrate improved community ambulation Baseline: 1.1 m/s Goal status: INITIAL   2.  Pt will improve 30 second STS test >/= 13 reps to demo improved functional LE strength and balance  Baseline: 11 reps Goal status: INITIAL    3. Pt will be able to hold situation 4 of M-CTSIB for >/= 15 seconds Baseline: 7-8 seconds Goal status: INITIAL   4.  Pt will be independent with final HEP for dynamic gait and balance to reduce fall risk.  Baseline: IND with vestibular HEP; progress balance HEP Goal status: INITIAL   5.  Pt will improve DGI by 3 points from baseline to demonstrate improved balance and reduced fall risk Baseline: 15/24 Goal status: INITIAL     ASSESSMENT:   CLINICAL IMPRESSION: Today's skilled PT session focused on further balance assessment with completion of DGI, patient scoring 15/24 indicating increased risk for falls. Most challenge noted with head movement, obstacle negotiation, and  body turns. Rest of session focused on functional balance activities, targeting these areas. Most challenge noted with lateral obstacle negotiation. Intermittent rest breaks required due to fatigue. Will continue per POC.    OBJECTIVE IMPAIRMENTS: Abnormal gait, decreased activity tolerance, decreased balance, decreased mobility, difficulty walking, decreased strength, decreased safety awareness, and dizziness.    ACTIVITY LIMITATIONS: bending, sitting, standing, transfers, bed mobility, and reach over head   PARTICIPATION LIMITATIONS: driving, community activity, and yard work   PERSONAL FACTORS: Age, Time since onset of injury/illness/exacerbation, and 3+ comorbidities: Anxiety, Arthritis, CAD, CHF, CKD, COPD, Depression, Hx of CVA, Hypothyroidism, OSA, Paroxysmal A-Fib   are also affecting patient's functional outcome.    REHAB POTENTIAL: Good   CLINICAL DECISION MAKING: Evolving/moderate complexity   EVALUATION COMPLEXITY: Moderate     PLAN:   PT FREQUENCY: 1x/week   PT DURATION: 8 weeks   PLANNED INTERVENTIONS: Therapeutic exercises, Therapeutic activity, Neuromuscular re-education, Balance training, Gait training, Patient/Family education, Self Care, Joint mobilization, Stair training, Vestibular training, Canalith repositioning, DME instructions, Cryotherapy, Moist heat, Manual therapy, and Re-evaluation   PLAN FOR NEXT SESSION: Standing Balance, Dynamic Gait Activities. Obstacle Negotiation, Head Movement   Howie Ill, [place], DPT 11/11/22 11:44 AM

## 2022-11-18 ENCOUNTER — Other Ambulatory Visit: Payer: Self-pay | Admitting: Family Medicine

## 2022-11-18 ENCOUNTER — Ambulatory Visit: Payer: Medicare PPO | Attending: Physician Assistant

## 2022-11-18 DIAGNOSIS — R2681 Unsteadiness on feet: Secondary | ICD-10-CM

## 2022-11-18 DIAGNOSIS — R7303 Prediabetes: Secondary | ICD-10-CM

## 2022-11-18 DIAGNOSIS — R296 Repeated falls: Secondary | ICD-10-CM

## 2022-11-18 DIAGNOSIS — R351 Nocturia: Secondary | ICD-10-CM

## 2022-11-18 DIAGNOSIS — I5022 Chronic systolic (congestive) heart failure: Secondary | ICD-10-CM

## 2022-11-18 DIAGNOSIS — I10 Essential (primary) hypertension: Secondary | ICD-10-CM

## 2022-11-18 DIAGNOSIS — R2689 Other abnormalities of gait and mobility: Secondary | ICD-10-CM

## 2022-11-18 DIAGNOSIS — R42 Dizziness and giddiness: Secondary | ICD-10-CM | POA: Diagnosis not present

## 2022-11-18 DIAGNOSIS — Z0000 Encounter for general adult medical examination without abnormal findings: Secondary | ICD-10-CM

## 2022-11-18 DIAGNOSIS — E782 Mixed hyperlipidemia: Secondary | ICD-10-CM

## 2022-11-18 DIAGNOSIS — I25810 Atherosclerosis of coronary artery bypass graft(s) without angina pectoris: Secondary | ICD-10-CM

## 2022-11-18 DIAGNOSIS — E039 Hypothyroidism, unspecified: Secondary | ICD-10-CM

## 2022-11-18 NOTE — Therapy (Signed)
OUTPATIENT PHYSICAL THERAPY VESTIBULAR TREATMENT NOTE   Patient Name: Luke Mckee MRN: [id number] DOB:Sep 06, 1940, 82 y.o., male Today's Date: 11/18/2022  PCP: [name], DO  REFERRING PROVIDER: Providence Crosby, PA-C   END OF SESSION:  PT End of Session - 11/18/22 1106     Visit Number 8    Number of Visits 12    Date for PT Re-Evaluation 12/24/22    Authorization Type [employer]    Progress Note Due on Visit 10    PT Start Time 1106   Pt arrived late   PT Stop Time 1145    PT Time Calculation (min) 39 min    Equipment Utilized During Treatment Gait belt    Activity Tolerance Patient tolerated treatment well    Behavior During Therapy WFL for tasks assessed/performed             Past Medical History:  Diagnosis Date   Anxiety    Arthritis    CAD (coronary artery disease) 2005   s/p CABG, DES   CHF (congestive heart failure) (HCC)    in EPIC care everywhere   Chronic kidney disease    COPD (chronic obstructive pulmonary disease) (HCC)    in EPIC care everywhere   Depression    Heart murmur    History of stroke    Hypothyroidism    OSA (obstructive sleep apnea)    Paroxysmal A-fib (HCC)    in EPIC careeverywhere   Past Surgical History:  Procedure Laterality Date   APPENDECTOMY     BUNIONECTOMY     CATARACT EXTRACTION     Right eye   CATARACT EXTRACTION W/PHACO Left 01/19/2021   Procedure: CATARACT EXTRACTION PHACO AND INTRAOCULAR LENS PLACEMENT (IOC) LEFT 3.09 00:28.3;  Surgeon: Nevada Crane, MD;  Location: [facility];  Service: Ophthalmology;  Laterality: Left;   COLONOSCOPY  07/06/2004   CORONARY ARTERY BYPASS GRAFT  2005   LOOP RECORDER INSERTION N/A 07/25/2018   Procedure: LOOP RECORDER INSERTION;  Surgeon: Regan Lemming, MD;  Location: [facility];  Service: Cardiovascular;  Laterality: N/A;   TOTAL HIP ARTHROPLASTY Right 11/23/2011   TOTAL HIP ARTHROPLASTY Left 09/07/2011   TOTAL KNEE ARTHROPLASTY Right  02/15/2018   VASECTOMY  1978   Patient Active Problem List   Diagnosis Date Noted   Dizziness on standing 08/16/2022   Impaired fasting glucose 09/24/2021   Depression 09/24/2021   Sensorineural hearing loss, bilateral 09/24/2021   Hypothyroid 09/24/2021   Hyperlipidemia 09/24/2021   Sleep apnea 09/24/2021   Hallux limitus, acquired, right 11/12/2019   Pre-diabetes 10/17/2019   Recurrent major depressive disorder, in partial remission (HCC) 04/16/2019   Obesity (BMI 30.0-34.9) 04/16/2019   Vasomotor rhinitis 10/25/2018   Hearing loss 08/28/2018   Osteoarthritis 08/28/2018   Tinnitus 08/28/2018   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 (HCC) 07/31/2018   Slurred speech 07/23/2018   Right arm weakness 07/23/2018   Hypokalemia 07/23/2018   Anxiety 07/20/2018   Atherosclerosis of native coronary artery of native heart with stable angina pectoris (HCC) 06/16/2018   Hx of CABG 06/16/2018   Mixed hyperlipidemia 06/16/2018   Benign essential HTN 06/16/2018   History of cerebrovascular accident (CVA) with residual deficit 06/07/2018   Systolic CHF (HCC) 02/15/2018   Hypertension 02/15/2018   S/P total knee arthroplasty, right 02/15/2018   Primary osteoarthritis of both knees 11/25/2017   CAD (coronary artery disease) 11/12/2016   Abnormal nuclear stress test 06/30/2016   Hallux limitus  of right foot 10/22/2015   Anesthesia complication 10/10/2015   GERD (gastroesophageal reflux disease) 10/10/2015   Hypothyroidism 10/10/2015   Coronary artery disease involving native coronary artery of native heart with angina pectoris (HCC) 10/10/2015   OSA on CPAP 01/10/2007   Diagnosis unknown 01/12/2003    ONSET DATE: Referral Date: 08/13/2022 (Acute on Chronic Dizziness)  REFERRING DIAG: R42 (ICD-10-CM) - Dizziness on standing  THERAPY DIAG:  Dizziness and giddiness  Repeated falls  Other abnormalities of gait and mobility  Unsteadiness on feet  Rationale  for Evaluation and Treatment: Rehabilitation  PERTINENT HISTORY: Anxiety, Arthritis, CAD, CHF, CKD, COPD, Depression, Hx of CVA, Hypothyroidism, OSA, Paroxysmal A-Fib   PRECAUTIONS: None  SUBJECTIVE: Patient reports he tweaked his ankle putting dog on the bed, almost lost his balance. But caught it before he fell. No other new changes complaints.   PAIN:  Are you having pain? No \ OBJECTIVE: Pt reports some ankle pain after tweaking it, PT assessed no edema/bruising noted. Not tender to palpation. Full ROM, mild discomfort. Educated to continue to monitor.   GAIT: Gait pattern: step through pattern and wide BOS Distance walked: 100' Assistive device utilized: None Level of assistance: SBA Comments: mild unsteadiness; reports some ankle pain with ambulation  TherEx (all completed with 3# ankle weights donned)   Standing Hip Abduction: with ankle weights and UE support, completed 2 x 10 reps on BLE's.   Heel Raises 2 x 10 rep with UE supports, cues for technique.   Alternating Hip Flexion: completed alternating marching with UE support, 2 x 10 reps on BLE's.   Sit to Stands without UE support from standard chair; 2 x 10 reps.   VESTIBULAR TREATMENT:   Standing on Airex:  Feet Hip Width with Eyes Open: Completed horizontal/vertical head turns x 10 reps, completed x 2 sets. Mild postural sway but improvements noted.  Eyes Closed with Feet Hip Width: Completed feet hip with eyes closed, 3 x 30 seconds. Mild postural sway, but improved stability noted.     Tandem Stance:    On Firm Surface, completed tandem stance with Eyes Open 2 x 30 seconds. Then trialed adding in horizontal head turns, completed with alternating foot forward and horizontal head turns x 10 reps each.    PATIENT EDUCATION: Education details: Continue HEP Person educated: Patient and Spouse Education method: Explanation Education comprehension: verbalized understanding   HOME EXERCISE PROGRAM: Access Code:  [email or web address]   GOALS: Goals reviewed with patient? Yes     SHORT TERM GOALS: Target date: 11/26/2022   1.  Pt will be able to hold situation 4 of M-CTSIB for >/= 10 seconds Baseline: 7-8 seconds Goal status: INITIAL     LONG TERM GOALS: Target date: 12/24/2022   Pt will improve gait speed to >/= 1.2 m/sec to demonstrate improved community ambulation Baseline: 1.1 m/s Goal status: INITIAL   2.  Pt will improve 30 second STS test >/= 13 reps to demo improved functional LE strength and balance  Baseline: 11 reps Goal status: INITIAL    3. Pt will be able to hold situation 4 of M-CTSIB for >/= 15 seconds Baseline: 7-8 seconds Goal status: INITIAL   4.  Pt will be independent with final HEP for dynamic gait and balance to reduce fall risk.  Baseline: IND with vestibular HEP; progress balance HEP Goal status: INITIAL   5.  Pt will improve DGI by 3 points from baseline to demonstrate improved balance and reduced fall risk Baseline: 15/24 Goal  status: INITIAL     ASSESSMENT:   CLINICAL IMPRESSION: Patient reports that he has tweaked ankle, PT assessed with no concerns. Encouraged to monitor. Rest of today's skilled PT session focused on continued strengthening and standing balance activities. Patient tolerating well overall, still mild imbalance with vision removed. Will continue per POC.    OBJECTIVE IMPAIRMENTS: Abnormal gait, decreased activity tolerance, decreased balance, decreased mobility, difficulty walking, decreased strength, decreased safety awareness, and dizziness.    ACTIVITY LIMITATIONS: bending, sitting, standing, transfers, bed mobility, and reach over head   PARTICIPATION LIMITATIONS: driving, community activity, and yard work   PERSONAL FACTORS: Age, Time since onset of injury/illness/exacerbation, and 3+ comorbidities: Anxiety, Arthritis, CAD, CHF, CKD, COPD, Depression, Hx of CVA, Hypothyroidism, OSA, Paroxysmal A-Fib   are also affecting patient's functional  outcome.    REHAB POTENTIAL: Good   CLINICAL DECISION MAKING: Evolving/moderate complexity   EVALUATION COMPLEXITY: Moderate     PLAN:   PT FREQUENCY: 1x/week   PT DURATION: 8 weeks   PLANNED INTERVENTIONS: Therapeutic exercises, Therapeutic activity, Neuromuscular re-education, Balance training, Gait training, Patient/Family education, Self Care, Joint mobilization, Stair training, Vestibular training, Canalith repositioning, DME instructions, Cryotherapy, Moist heat, Manual therapy, and Re-evaluation   PLAN FOR NEXT SESSION: Functional Strengthening, Standing Balance, Dynamic Gait Activities. Obstacle Negotiation, Head Movement   Howie Ill, [place], DPT 11/18/22 12:49 PM

## 2022-11-25 ENCOUNTER — Ambulatory Visit: Payer: Medicare PPO

## 2022-11-25 DIAGNOSIS — R296 Repeated falls: Secondary | ICD-10-CM

## 2022-11-25 DIAGNOSIS — R2689 Other abnormalities of gait and mobility: Secondary | ICD-10-CM

## 2022-11-25 DIAGNOSIS — R2681 Unsteadiness on feet: Secondary | ICD-10-CM

## 2022-11-25 DIAGNOSIS — R42 Dizziness and giddiness: Secondary | ICD-10-CM

## 2022-11-25 NOTE — Therapy (Signed)
OUTPATIENT PHYSICAL THERAPY VESTIBULAR TREATMENT NOTE   Patient Name: Luke Mckee MRN: [id number] DOB:1940-09-29, 82 y.o., male Today's Date: 11/25/2022  PCP: [name], DO  REFERRING PROVIDER: Providence Crosby, PA-C   END OF SESSION:  PT End of Session - 11/25/22 1111     Visit Number 9    Number of Visits 12    Date for PT Re-Evaluation 12/24/22    Authorization Type [employer]    Progress Note Due on Visit 10    PT Start Time 1110   Pt arrived late   PT Stop Time 1145    PT Time Calculation (min) 35 min    Equipment Utilized During Treatment Gait belt    Activity Tolerance Patient tolerated treatment well    Behavior During Therapy WFL for tasks assessed/performed             Past Medical History:  Diagnosis Date   Anxiety    Arthritis    CAD (coronary artery disease) 2005   s/p CABG, DES   CHF (congestive heart failure) (HCC)    in EPIC care everywhere   Chronic kidney disease    COPD (chronic obstructive pulmonary disease) (HCC)    in EPIC care everywhere   Depression    Heart murmur    History of stroke    Hypothyroidism    OSA (obstructive sleep apnea)    Paroxysmal A-fib (HCC)    in EPIC careeverywhere   Past Surgical History:  Procedure Laterality Date   APPENDECTOMY     BUNIONECTOMY     CATARACT EXTRACTION     Right eye   CATARACT EXTRACTION W/PHACO Left 01/19/2021   Procedure: CATARACT EXTRACTION PHACO AND INTRAOCULAR LENS PLACEMENT (IOC) LEFT 3.09 00:28.3;  Surgeon: Nevada Crane, MD;  Location: [facility];  Service: Ophthalmology;  Laterality: Left;   COLONOSCOPY  07/06/2004   CORONARY ARTERY BYPASS GRAFT  2005   LOOP RECORDER INSERTION N/A 07/25/2018   Procedure: LOOP RECORDER INSERTION;  Surgeon: Regan Lemming, MD;  Location: [facility];  Service: Cardiovascular;  Laterality: N/A;   TOTAL HIP ARTHROPLASTY Right 11/23/2011   TOTAL HIP ARTHROPLASTY Left 09/07/2011   TOTAL KNEE ARTHROPLASTY Right  02/15/2018   VASECTOMY  1978   Patient Active Problem List   Diagnosis Date Noted   Dizziness on standing 08/16/2022   Impaired fasting glucose 09/24/2021   Depression 09/24/2021   Sensorineural hearing loss, bilateral 09/24/2021   Hypothyroid 09/24/2021   Hyperlipidemia 09/24/2021   Sleep apnea 09/24/2021   Hallux limitus, acquired, right 11/12/2019   Pre-diabetes 10/17/2019   Recurrent major depressive disorder, in partial remission (HCC) 04/16/2019   Obesity (BMI 30.0-34.9) 04/16/2019   Vasomotor rhinitis 10/25/2018   Hearing loss 08/28/2018   Osteoarthritis 08/28/2018   Tinnitus 08/28/2018   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 (HCC) 07/31/2018   Slurred speech 07/23/2018   Right arm weakness 07/23/2018   Hypokalemia 07/23/2018   Anxiety 07/20/2018   Atherosclerosis of native coronary artery of native heart with stable angina pectoris (HCC) 06/16/2018   Hx of CABG 06/16/2018   Mixed hyperlipidemia 06/16/2018   Benign essential HTN 06/16/2018   History of cerebrovascular accident (CVA) with residual deficit 06/07/2018   Systolic CHF (HCC) 02/15/2018   Hypertension 02/15/2018   S/P total knee arthroplasty, right 02/15/2018   Primary osteoarthritis of both knees 11/25/2017   CAD (coronary artery disease) 11/12/2016   Abnormal nuclear stress test 06/30/2016   Hallux limitus  of right foot 10/22/2015   Anesthesia complication 10/10/2015   GERD (gastroesophageal reflux disease) 10/10/2015   Hypothyroidism 10/10/2015   Coronary artery disease involving native coronary artery of native heart with angina pectoris (HCC) 10/10/2015   OSA on CPAP 01/10/2007   Diagnosis unknown 01/12/2003    ONSET DATE: Referral Date: 08/13/2022 (Acute on Chronic Dizziness)  REFERRING DIAG: R42 (ICD-10-CM) - Dizziness on standing  THERAPY DIAG:  Dizziness and giddiness  Repeated falls  Other abnormalities of gait and mobility  Unsteadiness on feet  Rationale  for Evaluation and Treatment: Rehabilitation  PERTINENT HISTORY: Anxiety, Arthritis, CAD, CHF, CKD, COPD, Depression, Hx of CVA, Hypothyroidism, OSA, Paroxysmal A-Fib   PRECAUTIONS: None  SUBJECTIVE: Patient reports ankle is feeling better. No pain reported. Had a very busy weekend. No new changes complaints.   PAIN:  Are you having pain? No \ OBJECTIVE:  GAIT: Gait pattern: step through pattern and wide BOS Distance walked: 500' Assistive device utilized: None Level of assistance: SBA Comments: Initially IND; but more unsteadiness noted with fatigue. Close supervision required. Started to demo some mild veering and scissoring gait pattern. Cues for widen BOS and pacing.   TherEx (all completed with 3# ankle weights donned):  Standing Hip Abduction: with ankle weights and UE support, completed 2 x 10 reps on BLE's.   Heel Raises 2 x 10 rep with UE supports, follow by toe raises 2 x 10 reps.   Sit to Stands without UE support from standard chair;  x 10 reps.  Standing Hamstring Curl, 2 x 10 reps, UE support, Cues for upright posture.    Neuro Re-ED   Standing on Firm Surface: Completed alternating toe tap to 6" step x 15 reps bilat, working on improved SLS. CGA.  On firm surface with posterior leg, front leg standing on 6" step, working on maintaining balance and upright posterior, completed alternating foot, 4 x 30 seconds.   PATIENT EDUCATION: Education details: Continue HEP Person educated: Patient and Spouse Education method: Explanation Education comprehension: verbalized understanding   HOME EXERCISE PROGRAM: Access Code: [email or web address]   GOALS: Goals reviewed with patient? Yes     SHORT TERM GOALS: Target date: 11/26/2022   1.  Pt will be able to hold situation 4 of M-CTSIB for >/= 10 seconds Baseline: 7-8 seconds Goal status: INITIAL     LONG TERM GOALS: Target date: 12/24/2022   Pt will improve gait speed to >/= 1.2 m/sec to demonstrate improved community  ambulation Baseline: 1.1 m/s Goal status: INITIAL   2.  Pt will improve 30 second STS test >/= 13 reps to demo improved functional LE strength and balance  Baseline: 11 reps Goal status: INITIAL    3. Pt will be able to hold situation 4 of M-CTSIB for >/= 15 seconds Baseline: 7-8 seconds Goal status: INITIAL   4.  Pt will be independent with final HEP for dynamic gait and balance to reduce fall risk.  Baseline: IND with vestibular HEP; progress balance HEP Goal status: INITIAL   5.  Pt will improve DGI by 3 points from baseline to demonstrate improved balance and reduced fall risk Baseline: 15/24 Goal status: INITIAL     ASSESSMENT:   CLINICAL IMPRESSION: Patient tolerating activities well, continued functional balance and strengthening. Did complete some gait training focused on longer distance, with increasing fatigue patient with some increased unsteadiness and scissoring gait pattern but able to improve with cues for widen BOS. Will continue to progress toward all LTGs to patient's tolerance.  OBJECTIVE IMPAIRMENTS: Abnormal gait, decreased activity tolerance, decreased balance, decreased mobility, difficulty walking, decreased strength, decreased safety awareness, and dizziness.    ACTIVITY LIMITATIONS: bending, sitting, standing, transfers, bed mobility, and reach over head   PARTICIPATION LIMITATIONS: driving, community activity, and yard work   PERSONAL FACTORS: Age, Time since onset of injury/illness/exacerbation, and 3+ comorbidities: Anxiety, Arthritis, CAD, CHF, CKD, COPD, Depression, Hx of CVA, Hypothyroidism, OSA, Paroxysmal A-Fib   are also affecting patient's functional outcome.    REHAB POTENTIAL: Good   CLINICAL DECISION MAKING: Evolving/moderate complexity   EVALUATION COMPLEXITY: Moderate     PLAN:   PT FREQUENCY: 1x/week   PT DURATION: 8 weeks   PLANNED INTERVENTIONS: Therapeutic exercises, Therapeutic activity, Neuromuscular re-education, Balance  training, Gait training, Patient/Family education, Self Care, Joint mobilization, Stair training, Vestibular training, Canalith repositioning, DME instructions, Cryotherapy, Moist heat, Manual therapy, and Re-evaluation   PLAN FOR NEXT SESSION: Check STGs. Functional Strengthening, Standing Balance, Dynamic Gait Activities. Obstacle Negotiation, Head Movement   Howie Ill, [place], DPT 11/25/22 11:50 AM

## 2022-12-02 ENCOUNTER — Ambulatory Visit: Payer: Medicare PPO

## 2022-12-02 DIAGNOSIS — R2689 Other abnormalities of gait and mobility: Secondary | ICD-10-CM | POA: Diagnosis not present

## 2022-12-02 DIAGNOSIS — R296 Repeated falls: Secondary | ICD-10-CM

## 2022-12-02 DIAGNOSIS — R2681 Unsteadiness on feet: Secondary | ICD-10-CM | POA: Diagnosis not present

## 2022-12-02 DIAGNOSIS — R42 Dizziness and giddiness: Secondary | ICD-10-CM

## 2022-12-02 NOTE — Therapy (Signed)
OUTPATIENT PHYSICAL THERAPY VESTIBULAR TREATMENT NOTE   Patient Name: Luke Mckee MRN: [id number] DOB:09-23-1940, 82 y.o., male Today's Date: 12/02/2022  PCP: [name], DO  REFERRING PROVIDER: Providence Crosby, PA-C   END OF SESSION:  PT End of Session - 12/02/22 1105     Visit Number 10    Number of Visits 12    Date for PT Re-Evaluation 12/24/22    Authorization Type [employer]    Progress Note Due on Visit 10    PT Start Time 1105    PT Stop Time 1144    PT Time Calculation (min) 39 min    Equipment Utilized During Treatment Gait belt    Activity Tolerance Patient tolerated treatment well    Behavior During Therapy WFL for tasks assessed/performed             Past Medical History:  Diagnosis Date   Anxiety    Arthritis    CAD (coronary artery disease) 2005   s/p CABG, DES   CHF (congestive heart failure) (HCC)    in EPIC care everywhere   Chronic kidney disease    COPD (chronic obstructive pulmonary disease) (HCC)    in EPIC care everywhere   Depression    Heart murmur    History of stroke    Hypothyroidism    OSA (obstructive sleep apnea)    Paroxysmal A-fib (HCC)    in EPIC careeverywhere   Past Surgical History:  Procedure Laterality Date   APPENDECTOMY     BUNIONECTOMY     CATARACT EXTRACTION     Right eye   CATARACT EXTRACTION W/PHACO Left 01/19/2021   Procedure: CATARACT EXTRACTION PHACO AND INTRAOCULAR LENS PLACEMENT (IOC) LEFT 3.09 00:28.3;  Surgeon: Nevada Crane, MD;  Location: [facility];  Service: Ophthalmology;  Laterality: Left;   COLONOSCOPY  07/06/2004   CORONARY ARTERY BYPASS GRAFT  2005   LOOP RECORDER INSERTION N/A 07/25/2018   Procedure: LOOP RECORDER INSERTION;  Surgeon: Regan Lemming, MD;  Location: [facility];  Service: Cardiovascular;  Laterality: N/A;   TOTAL HIP ARTHROPLASTY Right 11/23/2011   TOTAL HIP ARTHROPLASTY Left 09/07/2011   TOTAL KNEE ARTHROPLASTY Right 02/15/2018    VASECTOMY  1978   Patient Active Problem List   Diagnosis Date Noted   Dizziness on standing 08/16/2022   Impaired fasting glucose 09/24/2021   Depression 09/24/2021   Sensorineural hearing loss, bilateral 09/24/2021   Hypothyroid 09/24/2021   Hyperlipidemia 09/24/2021   Sleep apnea 09/24/2021   Hallux limitus, acquired, right 11/12/2019   Pre-diabetes 10/17/2019   Recurrent major depressive disorder, in partial remission (HCC) 04/16/2019   Obesity (BMI 30.0-34.9) 04/16/2019   Vasomotor rhinitis 10/25/2018   Hearing loss 08/28/2018   Osteoarthritis 08/28/2018   Tinnitus 08/28/2018   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 (HCC) 07/31/2018   Slurred speech 07/23/2018   Right arm weakness 07/23/2018   Hypokalemia 07/23/2018   Anxiety 07/20/2018   Atherosclerosis of native coronary artery of native heart with stable angina pectoris (HCC) 06/16/2018   Hx of CABG 06/16/2018   Mixed hyperlipidemia 06/16/2018   Benign essential HTN 06/16/2018   History of cerebrovascular accident (CVA) with residual deficit 06/07/2018   Systolic CHF (HCC) 02/15/2018   Hypertension 02/15/2018   S/P total knee arthroplasty, right 02/15/2018   Primary osteoarthritis of both knees 11/25/2017   CAD (coronary artery disease) 11/12/2016   Abnormal nuclear stress test 06/30/2016   Hallux limitus of right foot 10/22/2015  Anesthesia complication 10/10/2015   GERD (gastroesophageal reflux disease) 10/10/2015   Hypothyroidism 10/10/2015   Coronary artery disease involving native coronary artery of native heart with angina pectoris (HCC) 10/10/2015   OSA on CPAP 01/10/2007   Diagnosis unknown 01/12/2003    ONSET DATE: Referral Date: 08/13/2022 (Acute on Chronic Dizziness)  REFERRING DIAG: R42 (ICD-10-CM) - Dizziness on standing  THERAPY DIAG:  Dizziness and giddiness  Repeated falls  Unsteadiness on feet  Other abnormalities of gait and mobility  Rationale for Evaluation  and Treatment: Rehabilitation  PERTINENT HISTORY: Anxiety, Arthritis, CAD, CHF, CKD, COPD, Depression, Hx of CVA, Hypothyroidism, OSA, Paroxysmal A-Fib   PRECAUTIONS: None  SUBJECTIVE: Patient reports ankle is feeling better. No pain reported. Had a very busy weekend. No new changes complaints.   PAIN:  Are you having pain? No \ OBJECTIVE: M-CSTIB:   Situation 1: 30 seconds  Situation 2: 30 seconds  Situation 3: 30 seconds  Situation 4: 10.3 seconds (average of three trials)   GAIT: Gait pattern: step through pattern and wide BOS Distance walked: >300'  Assistive device utilized: None Level of assistance: SBA Comments: unsteadiness noted with increased fatigue  TherEx (all completed with 3# ankle weights donned):  NuStep with BUE/BLE for 8 mins for functional strengthening, gross motor control. Pt tolerating well.    Heel Raises 2 x 10 rep with UE supports, follow by toe raises 2 x 10 reps.    Neuro Re-ED  Habituation: Bending to Colored Targets, completed 2 sets x 10-12 reps each, no dizziness reported. Mild unsteadiness with fatigue, but cues for energy conservation and technique. Pt also reporting has been bending to wash feet in shower, educated on use of shower chair to promote improved safety and reduced dizziness with bathing. Pt verbalized understanding.    Standing Balance: Surface: Airex Position: Feet Hip Width Apart Completed with: Eyes Closed; Head Turns x 10 Reps and Head Nods x 10 Reps; Also completed standing feet hip width with vision removed, 3 x 30 seconds.    Added the bolded items to HEP on 12/02/22:  Access Code: [email or web address] URL: [email or web address] Date: 12/02/2022 Prepared by: Nehemiah Settle Fairly  Exercises - Romberg Stance with Head Nods  - 2 x daily - 5 x weekly - 1 sets - 10 reps - Romberg Stance with Head Rotation  - 2 x daily - 5 x weekly - 1 sets - 10 reps - Romberg Stance with Eyes Closed  - 2 x daily - 5 x weekly - 1 sets - 30  seconds hold - Sit to Stand Without Arm Support  - 2 x daily - 5 x weekly - 1 sets - 10 reps - Standing March with Counter Support  - 1 x daily - 5 x weekly - 2 sets - 10 reps - Seated Gaze Stabilization with Head Rotation  - 1 x daily - 5 x weekly - 1 sets - 2 reps - 60 seconds hold - Heel Raises with Counter Support  - 1 x daily - 5 x weekly - 2 sets - 10 reps   PATIENT EDUCATION: Education details: Progress toward STGs; Continue HEP Person educated: Patient and Spouse Education method: Explanation Education comprehension: verbalized understanding   HOME EXERCISE PROGRAM: Access Code: [email or web address]   GOALS: Goals reviewed with patient? Yes     SHORT TERM GOALS: Target date: 11/26/2022   1.  Pt will be able to hold situation 4 of M-CTSIB for >/= 10 seconds Baseline: 7-8 seconds; 10.3 seconds Goal status: MET  LONG TERM GOALS: Target date: 12/24/2022   Pt will improve gait speed to >/= 1.2 m/sec to demonstrate improved community ambulation Baseline: 1.1 m/s Goal status: INITIAL   2.  Pt will improve 30 second STS test >/= 13 reps to demo improved functional LE strength and balance  Baseline: 11 reps Goal status: INITIAL    3. Pt will be able to hold situation 4 of M-CTSIB for >/= 15 seconds Baseline: 7-8 seconds Goal status: INITIAL   4.  Pt will be independent with final HEP for dynamic gait and balance to reduce fall risk.  Baseline: IND with vestibular HEP; progress balance HEP Goal status: INITIAL   5.  Pt will improve DGI by 3 points from baseline to demonstrate improved balance and reduced fall risk Baseline: 15/24 Goal status: INITIAL     ASSESSMENT:   CLINICAL IMPRESSION: Today's skilled PT session included assessment of patient's progress toward STGs. Patient able to meet all STGs. Patient ambulating into/out of session today with improved stability, patient continues to ambulate and move at a quick pace often increasing instability therefore continues to  require some cues for this and safety awareness. Continued session focused on dynamic standing balance and strengthening activities with patient tolerating well. Patient is making steady progress with therapy and will continue to benefit from skilled PT services to progress toward LTGs.   OBJECTIVE IMPAIRMENTS: Abnormal gait, decreased activity tolerance, decreased balance, decreased mobility, difficulty walking, decreased strength, decreased safety awareness, and dizziness.    ACTIVITY LIMITATIONS: bending, sitting, standing, transfers, bed mobility, and reach over head   PARTICIPATION LIMITATIONS: driving, community activity, and yard work   PERSONAL FACTORS: Age, Time since onset of injury/illness/exacerbation, and 3+ comorbidities: Anxiety, Arthritis, CAD, CHF, CKD, COPD, Depression, Hx of CVA, Hypothyroidism, OSA, Paroxysmal A-Fib   are also affecting patient's functional outcome.    REHAB POTENTIAL: Good   CLINICAL DECISION MAKING: Evolving/moderate complexity   EVALUATION COMPLEXITY: Moderate     PLAN:   PT FREQUENCY: 1x/week   PT DURATION: 8 weeks   PLANNED INTERVENTIONS: Therapeutic exercises, Therapeutic activity, Neuromuscular re-education, Balance training, Gait training, Patient/Family education, Self Care, Joint mobilization, Stair training, Vestibular training, Canalith repositioning, DME instructions, Cryotherapy, Moist heat, Manual therapy, and Re-evaluation   PLAN FOR NEXT SESSION:  Functional Strengthening, Standing Balance, Dynamic Gait Activities. Obstacle Negotiation, Head Movement   [place], [place], DPT 12/02/22 11:50 AM

## 2022-12-11 DIAGNOSIS — G4733 Obstructive sleep apnea (adult) (pediatric): Secondary | ICD-10-CM | POA: Diagnosis not present

## 2022-12-15 ENCOUNTER — Other Ambulatory Visit: Payer: Medicare PPO

## 2022-12-15 DIAGNOSIS — I10 Essential (primary) hypertension: Secondary | ICD-10-CM | POA: Diagnosis not present

## 2022-12-15 DIAGNOSIS — R7303 Prediabetes: Secondary | ICD-10-CM

## 2022-12-15 DIAGNOSIS — R351 Nocturia: Secondary | ICD-10-CM | POA: Diagnosis not present

## 2022-12-15 DIAGNOSIS — E782 Mixed hyperlipidemia: Secondary | ICD-10-CM

## 2022-12-15 DIAGNOSIS — Z0000 Encounter for general adult medical examination without abnormal findings: Secondary | ICD-10-CM | POA: Diagnosis not present

## 2022-12-15 DIAGNOSIS — I25810 Atherosclerosis of coronary artery bypass graft(s) without angina pectoris: Secondary | ICD-10-CM | POA: Diagnosis not present

## 2022-12-15 DIAGNOSIS — E039 Hypothyroidism, unspecified: Secondary | ICD-10-CM | POA: Diagnosis not present

## 2022-12-15 DIAGNOSIS — I5022 Chronic systolic (congestive) heart failure: Secondary | ICD-10-CM | POA: Diagnosis not present

## 2022-12-16 ENCOUNTER — Ambulatory Visit: Payer: Medicare PPO | Attending: Physician Assistant

## 2022-12-16 DIAGNOSIS — R2689 Other abnormalities of gait and mobility: Secondary | ICD-10-CM | POA: Insufficient documentation

## 2022-12-16 DIAGNOSIS — R296 Repeated falls: Secondary | ICD-10-CM | POA: Diagnosis not present

## 2022-12-16 DIAGNOSIS — R42 Dizziness and giddiness: Secondary | ICD-10-CM | POA: Insufficient documentation

## 2022-12-16 DIAGNOSIS — R2681 Unsteadiness on feet: Secondary | ICD-10-CM | POA: Insufficient documentation

## 2022-12-16 LAB — CBC WITH DIFFERENTIAL/PLATELET
Absolute Lymphocytes: 1759 {cells}/uL (ref 850–3900)
Absolute Monocytes: 672 {cells}/uL (ref 200–950)
Basophils Absolute: 80 {cells}/uL (ref 0–200)
Basophils Relative: 1.1 %
Eosinophils Absolute: 241 {cells}/uL (ref 15–500)
Eosinophils Relative: 3.3 %
HCT: 39.2 % (ref 38.5–50.0)
Hemoglobin: 13.1 g/dL — ABNORMAL LOW (ref 13.2–17.1)
MCH: 31.5 pg (ref 27.0–33.0)
MCHC: 33.4 g/dL (ref 32.0–36.0)
MCV: 94.2 fL (ref 80.0–100.0)
MPV: 9.6 fL (ref 7.5–12.5)
Monocytes Relative: 9.2 %
Neutro Abs: 4548 {cells}/uL (ref 1500–7800)
Neutrophils Relative %: 62.3 %
Platelets: 225 10*3/uL (ref 140–400)
RBC: 4.16 10*6/uL — ABNORMAL LOW (ref 4.20–5.80)
RDW: 13 % (ref 11.0–15.0)
Total Lymphocyte: 24.1 %
WBC: 7.3 10*3/uL (ref 3.8–10.8)

## 2022-12-16 LAB — COMPLETE METABOLIC PANEL WITH GFR
AG Ratio: 1.3 (calc) (ref 1.0–2.5)
ALT: 14 U/L (ref 9–46)
AST: 15 U/L (ref 10–35)
Albumin: 3.8 g/dL (ref 3.6–5.1)
Alkaline phosphatase (APISO): 52 U/L (ref 35–144)
BUN: 16 mg/dL (ref 7–25)
CO2: 29 mmol/L (ref 20–32)
Calcium: 9 mg/dL (ref 8.6–10.3)
Chloride: 103 mmol/L (ref 98–110)
Creat: 0.95 mg/dL (ref 0.70–1.22)
Globulin: 3 g/dL (ref 1.9–3.7)
Glucose, Bld: 107 mg/dL — ABNORMAL HIGH (ref 65–99)
Potassium: 3.8 mmol/L (ref 3.5–5.3)
Sodium: 141 mmol/L (ref 135–146)
Total Bilirubin: 0.6 mg/dL (ref 0.2–1.2)
Total Protein: 6.8 g/dL (ref 6.1–8.1)
eGFR: 80 mL/min/{1.73_m2} (ref >=60)

## 2022-12-16 LAB — LIPID PANEL
Cholesterol: 104 mg/dL (ref <200)
HDL: 39 mg/dL — ABNORMAL LOW (ref >=40)
LDL Cholesterol (Calc): 50 mg/dL
Non-HDL Cholesterol (Calc): 65 mg/dL (ref <130)
Total CHOL/HDL Ratio: 2.7 (calc) (ref <5.0)
Triglycerides: 74 mg/dL (ref <150)

## 2022-12-16 LAB — PSA: PSA: 1.16 ng/mL (ref <=4.00)

## 2022-12-16 LAB — HEMOGLOBIN A1C
Hgb A1c MFr Bld: 6.1 %{Hb} — ABNORMAL HIGH (ref <5.7)
Mean Plasma Glucose: 128 mg/dL
eAG (mmol/L): 7.1 mmol/L

## 2022-12-16 LAB — T4, FREE: Free T4: 1.3 ng/dL (ref 0.8–1.8)

## 2022-12-16 LAB — TSH: TSH: 0.92 m[IU]/L (ref 0.40–4.50)

## 2022-12-16 NOTE — Therapy (Signed)
OUTPATIENT PHYSICAL THERAPY VESTIBULAR TREATMENT NOTE   Patient Name: Luke Mckee MRN: [id number] DOB:08-06-1940, 82 y.o., male Today's Date: 12/16/2022  PCP: [name], DO  REFERRING PROVIDER: Providence Crosby, PA-C   END OF SESSION:  PT End of Session - 12/16/22 1105     Visit Number 11    Number of Visits 12    Date for PT Re-Evaluation 12/24/22    Authorization Type [employer]    Progress Note Due on Visit 10    PT Start Time 1105   pt arrived late   PT Stop Time 1120   limited due to patient's participation (see note for additional details)   PT Time Calculation (min) 15 min    Equipment Utilized During Treatment Gait belt    Activity Tolerance Patient tolerated treatment well    Behavior During Therapy WFL for tasks assessed/performed              Past Medical History:  Diagnosis Date   Anxiety    Arthritis    CAD (coronary artery disease) 2005   s/p CABG, DES   CHF (congestive heart failure) (HCC)    in EPIC care everywhere   Chronic kidney disease    COPD (chronic obstructive pulmonary disease) (HCC)    in EPIC care everywhere   Depression    Heart murmur    History of stroke    Hypothyroidism    OSA (obstructive sleep apnea)    Paroxysmal A-fib (HCC)    in EPIC careeverywhere   Past Surgical History:  Procedure Laterality Date   APPENDECTOMY     BUNIONECTOMY     CATARACT EXTRACTION     Right eye   CATARACT EXTRACTION W/PHACO Left 01/19/2021   Procedure: CATARACT EXTRACTION PHACO AND INTRAOCULAR LENS PLACEMENT (IOC) LEFT 3.09 00:28.3;  Surgeon: Nevada Crane, MD;  Location: [facility];  Service: Ophthalmology;  Laterality: Left;   COLONOSCOPY  07/06/2004   CORONARY ARTERY BYPASS GRAFT  2005   LOOP RECORDER INSERTION N/A 07/25/2018   Procedure: LOOP RECORDER INSERTION;  Surgeon: Regan Lemming, MD;  Location: [facility];  Service: Cardiovascular;  Laterality: N/A;   TOTAL HIP ARTHROPLASTY Right 11/23/2011    TOTAL HIP ARTHROPLASTY Left 09/07/2011   TOTAL KNEE ARTHROPLASTY Right 02/15/2018   VASECTOMY  1978   Patient Active Problem List   Diagnosis Date Noted   Dizziness on standing 08/16/2022   Impaired fasting glucose 09/24/2021   Depression 09/24/2021   Sensorineural hearing loss, bilateral 09/24/2021   Hypothyroid 09/24/2021   Hyperlipidemia 09/24/2021   Sleep apnea 09/24/2021   Hallux limitus, acquired, right 11/12/2019   Pre-diabetes 10/17/2019   Recurrent major depressive disorder, in partial remission (HCC) 04/16/2019   Obesity (BMI 30.0-34.9) 04/16/2019   Vasomotor rhinitis 10/25/2018   Hearing loss 08/28/2018   Osteoarthritis 08/28/2018   Tinnitus 08/28/2018   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 (HCC) 07/31/2018   Slurred speech 07/23/2018   Right arm weakness 07/23/2018   Hypokalemia 07/23/2018   Anxiety 07/20/2018   Atherosclerosis of native coronary artery of native heart with stable angina pectoris (HCC) 06/16/2018   Hx of CABG 06/16/2018   Mixed hyperlipidemia 06/16/2018   Benign essential HTN 06/16/2018   History of cerebrovascular accident (CVA) with residual deficit 06/07/2018   Systolic CHF (HCC) 02/15/2018   Hypertension 02/15/2018   S/P total knee arthroplasty, right 02/15/2018   Primary osteoarthritis of both knees 11/25/2017   CAD (coronary artery disease)  11/12/2016   Abnormal nuclear stress test 06/30/2016   Hallux limitus of right foot 10/22/2015   Anesthesia complication 10/10/2015   GERD (gastroesophageal reflux disease) 10/10/2015   Hypothyroidism 10/10/2015   Coronary artery disease involving native coronary artery of native heart with angina pectoris (HCC) 10/10/2015   OSA on CPAP 01/10/2007   Diagnosis unknown 01/12/2003    ONSET DATE: Referral Date: 08/13/2022 (Acute on Chronic Dizziness)  REFERRING DIAG: R42 (ICD-10-CM) - Dizziness on standing  THERAPY DIAG:  Dizziness and giddiness  Repeated  falls  Unsteadiness on feet  Other abnormalities of gait and mobility  Rationale for Evaluation and Treatment: Rehabilitation  PERTINENT HISTORY: Anxiety, Arthritis, CAD, CHF, CKD, COPD, Depression, Hx of CVA, Hypothyroidism, OSA, Paroxysmal A-Fib   PRECAUTIONS: None  SUBJECTIVE: Patient reports ran out of Lexapro. Patient reports he has been feeling very heavy headed. Don't feel like moving much. No dizziness reported. Patient reports significant lightheadedness. Difficulty with staying awake due to fatigue per patient reports.   PAIN:  Are you having pain? No OBJECTIVE: PT assessed patient due to subjective reports. BP: 111/68, HR: 67. Completed Neuro Check and Oculomotor Screen with normal findings. Normal strength. Denies vision changes. Denies SOB. Vitals stable upon assessment. Completed chart review of lab work due to patient wife stating HgB low, HgB at 13.1 (if normal range for safe participation in PT services.  PT speaking with patient regarding participation in PT services, patient requesting to no participate in PT services. PT verbalized understanding. Educated patient, patient's spouse and granddaughter on following up with MD, and potential seek emergency care if began to feel worse.   PATIENT EDUCATION: Education details: See Above for Details)  Person educated: Patient and Spouse Education method: Explanation Education comprehension: verbalized understanding   HOME EXERCISE PROGRAM: Access Code: [email or web address]   GOALS: Goals reviewed with patient? Yes     SHORT TERM GOALS: Target date: 11/26/2022   1.  Pt will be able to hold situation 4 of M-CTSIB for >/= 10 seconds Baseline: 7-8 seconds; 10.3 seconds Goal status: MET    LONG TERM GOALS: Target date: 12/24/2022   Pt will improve gait speed to >/= 1.2 m/sec to demonstrate improved community ambulation Baseline: 1.1 m/s Goal status: INITIAL   2.  Pt will improve 30 second STS test >/= 13 reps to demo improved  functional LE strength and balance  Baseline: 11 reps Goal status: INITIAL    3. Pt will be able to hold situation 4 of M-CTSIB for >/= 15 seconds Baseline: 7-8 seconds Goal status: INITIAL   4.  Pt will be independent with final HEP for dynamic gait and balance to reduce fall risk.  Baseline: IND with vestibular HEP; progress balance HEP Goal status: INITIAL   5.  Pt will improve DGI by 3 points from baseline to demonstrate improved balance and reduced fall risk Baseline: 15/24 Goal status: INITIAL     ASSESSMENT:   CLINICAL IMPRESSION: Patient presented to PT session with complaints of drowsiness and not feeling well. PT assessed due to recent change. Chart reviewed with recent blood work with no significant findings indicating contraindication to PT participation. BP assessed, and normal neuro assessment today with no significant findings. Patient declining participation due to feeling unwell. PT verbalized understanding. Educated patient and family members on following up with MD or seeking emergency care if begin to feel worse. Vebralized understanding. Will reassess at next visit.   OBJECTIVE IMPAIRMENTS: Abnormal gait, decreased activity tolerance, decreased balance, decreased mobility, difficulty  walking, decreased strength, decreased safety awareness, and dizziness.    ACTIVITY LIMITATIONS: bending, sitting, standing, transfers, bed mobility, and reach over head   PARTICIPATION LIMITATIONS: driving, community activity, and yard work   PERSONAL FACTORS: Age, Time since onset of injury/illness/exacerbation, and 3+ comorbidities: Anxiety, Arthritis, CAD, CHF, CKD, COPD, Depression, Hx of CVA, Hypothyroidism, OSA, Paroxysmal A-Fib   are also affecting patient's functional outcome.    REHAB POTENTIAL: Good   CLINICAL DECISION MAKING: Evolving/moderate complexity   EVALUATION COMPLEXITY: Moderate     PLAN:   PT FREQUENCY: 1x/week   PT DURATION: 8 weeks   PLANNED  INTERVENTIONS: Therapeutic exercises, Therapeutic activity, Neuromuscular re-education, Balance training, Gait training, Patient/Family education, Self Care, Joint mobilization, Stair training, Vestibular training, Canalith repositioning, DME instructions, Cryotherapy, Moist heat, Manual therapy, and Re-evaluation   PLAN FOR NEXT SESSION:  Will Need Re-Cert   Creed Copper Fairly, PT, DPT 12/16/22 11:32 AM

## 2022-12-21 ENCOUNTER — Ambulatory Visit (INDEPENDENT_AMBULATORY_CARE_PROVIDER_SITE_OTHER): Payer: Medicare PPO | Admitting: Family Medicine

## 2022-12-21 VITALS — BP 110/64 | HR 66 | Ht 68.0 in | Wt 214.0 lb

## 2022-12-21 DIAGNOSIS — I10 Essential (primary) hypertension: Secondary | ICD-10-CM

## 2022-12-21 DIAGNOSIS — I5022 Chronic systolic (congestive) heart failure: Secondary | ICD-10-CM | POA: Diagnosis not present

## 2022-12-21 DIAGNOSIS — E039 Hypothyroidism, unspecified: Secondary | ICD-10-CM

## 2022-12-21 DIAGNOSIS — F5104 Psychophysiologic insomnia: Secondary | ICD-10-CM | POA: Diagnosis not present

## 2022-12-21 DIAGNOSIS — M25562 Pain in left knee: Secondary | ICD-10-CM

## 2022-12-21 DIAGNOSIS — M170 Bilateral primary osteoarthritis of knee: Secondary | ICD-10-CM

## 2022-12-21 DIAGNOSIS — M25512 Pain in left shoulder: Secondary | ICD-10-CM

## 2022-12-21 DIAGNOSIS — R7303 Prediabetes: Secondary | ICD-10-CM | POA: Diagnosis not present

## 2022-12-21 DIAGNOSIS — I25810 Atherosclerosis of coronary artery bypass graft(s) without angina pectoris: Secondary | ICD-10-CM

## 2022-12-21 DIAGNOSIS — F419 Anxiety disorder, unspecified: Secondary | ICD-10-CM

## 2022-12-21 DIAGNOSIS — Z0000 Encounter for general adult medical examination without abnormal findings: Secondary | ICD-10-CM

## 2022-12-21 DIAGNOSIS — E782 Mixed hyperlipidemia: Secondary | ICD-10-CM

## 2022-12-21 DIAGNOSIS — Z23 Encounter for immunization: Secondary | ICD-10-CM

## 2022-12-21 DIAGNOSIS — G8929 Other chronic pain: Secondary | ICD-10-CM

## 2022-12-21 MED ORDER — ESCITALOPRAM OXALATE 20 MG PO TABS: 20.0000 mg | ORAL_TABLET | Freq: Every day | ORAL | 3 refills | Status: DC

## 2022-12-21 NOTE — Progress Notes (Unsigned)
Subjective:    Patient ID: Luke Mckee, male    DOB: [birth year], 82 y.o.   MRN: [id number]  Luke Mckee is a 82 y.o. male presenting on 12/21/2022 for Annual Exam (Lab results)   HPI  Discussed the use of AI scribe software for clinical note transcription with the patient, who gave verbal consent to proceed.  The patient, with a history of prediabetes, heart disease, and hypothyroidism, presents for a routine six-month follow-up. He reports no new health issues since the last visit. The patient's blood sugar levels have remained stable, with HbA1c consistently around 6.1-6.2. His cholesterol levels are well-controlled, with LDL at 50, below the target of 55. Liver and kidney function tests are within normal limits. Hemoglobin levels are slightly lower than average at 13.1, but this is not a significant change from previous levels. The patient's PSA for prostate cancer screening is 1.16, well within the normal range. Thyroid function is normal on a regimen of levothyroxine 125.  The patient is on a regimen of Lexapro 20mg , which was recently adjusted from two 10mg  tablets to one 20mg  tablet daily. He also has lorazepam available as needed for anxiety, but reports rarely needing it. The patient is on Lasix for heart disease, which he adjusts between one and two doses daily based on symptoms. He reports increased urination, which is expected with this medication.  The patient has a history of knee issues and has previously had one knee replaced. He reports occasional trouble with the other knee and expresses interest in a gel injection to delay the need for a replacement. He also reports intermittent discomfort in his left shoulder. The patient has received a series of vaccinations, including for RSV, and is due for an updated pneumonia vaccine.      Hearing Loss / Vestibular Dysfunction   Episodic Back Pain / L Shoulder Pain Chronic problems, episodic flares. L shoulder > R Using topical  Voltaren AS NEEDED   History of CVA with residual deficit R sided weakness / multiple L MCA / TIA history HYPERTENSION Hyperlipidemia CAD s/p CABG with stable angina on nitrate  Followed by Neuro/Cards, see prior notes for background information. S/p loop recorder. No identified AFib. Denies any chest pain, new focal weakness, dyspnea, worsening edema, near syncope or syncope   LDL 50, at goal  Elevated W0J / PreDM Obesity BMI >32 A1c 6.1   Hypothyroidism Chronic problem Last lab now 12/2022 showed normal Thyroid panel and T4 He is taking Levothyroxine daily, currently doing well - with some improvement after med adjustment of changing dosing so does not take other meds with levothyroxine.  Cardiology kept Lasix 80mg  TWICE A DAY, occasionally he may only take 1. - They reduced Atenolol to 25mg  daily. Also held Spironolactone.  Rarely needing Lorazepam  LEFT Knee OA/DJD Referral to [facility], discuss gel injections to delay replacement   LEFT Shoulder ALSO referral requested   Anxiety / Depression recurrent in partial remission - Interval update with improvement. Improved on SSRI Lexapro 10mg  x 2 = 20mg , needs new order   Feels more sedentary but goals to improve activity.   Psychological Insomnia Failed Ambien Improved on Trazodone nightly.   OSA on CPAP Followed by Pulmonology, no repeat sleep study - Today reports that sleep apnea is well controlled. He uses the CPAP machine every night. Tolerates the machine well, and thinks that sleeps better with it and feels good. No new concerns or symptoms.  Health Maintenance:  Due for Prevnar-20 updated  pneumonia vaccine today  RSV vaccine is done.  PSA 1.16 (negative, 11/2022),     12/21/2022    5:34 PM 10/22/2022    3:11 PM 06/23/2022    2:50 PM  Depression screen PHQ 2/9  Decreased Interest 0 0 0  Down, Depressed, Hopeless 0 0 0  PHQ - 2 Score 0 0 0  Altered sleeping 0 0 0  Tired, decreased  energy 0 0 1  Change in appetite 0 0 0  Feeling bad or failure about yourself  0 0 0  Trouble concentrating  0 0  Moving slowly or fidgety/restless 0 0 0  Suicidal thoughts 0 0 0  PHQ-9 Score 0 0 1  Difficult doing work/chores Not difficult at all Not difficult at all Not difficult at all       06/23/2022    2:50 PM 12/29/2021    2:48 PM 07/03/2021    3:37 PM 10/17/2019    2:45 PM  GAD 7 : Generalized Anxiety Score  Nervous, Anxious, on Edge 1 1 1 1   Control/stop worrying 1 1 1 1   Worry too much - different things 0 3 1 1   Trouble relaxing 0 2 0 0  Restless 0 0 0 0  Easily annoyed or irritable 1 2 0 0  Afraid - awful might happen 0 2 0 0  Total GAD 7 Score 3 11 3 3   Anxiety Difficulty  Somewhat difficult Not difficult at all Not difficult at all     Past Medical History:  Diagnosis Date   Anxiety    Arthritis    CAD (coronary artery disease) 2005   s/p CABG, DES   CHF (congestive heart failure) (HCC)    in EPIC care everywhere   Chronic kidney disease    COPD (chronic obstructive pulmonary disease) (HCC)    in EPIC care everywhere   Depression    Heart murmur    History of stroke    Hypothyroidism    OSA (obstructive sleep apnea)    Paroxysmal A-fib (HCC)    in EPIC careeverywhere   Past Surgical History:  Procedure Laterality Date   APPENDECTOMY     BUNIONECTOMY     CATARACT EXTRACTION     Right eye   CATARACT EXTRACTION W/PHACO Left 01/19/2021   Procedure: CATARACT EXTRACTION PHACO AND INTRAOCULAR LENS PLACEMENT (IOC) LEFT 3.09 00:28.3;  Surgeon: Nevada Crane, MD;  Location: [facility];  Service: Ophthalmology;  Laterality: Left;   COLONOSCOPY  07/06/2004   CORONARY ARTERY BYPASS GRAFT  2005   LOOP RECORDER INSERTION N/A 07/25/2018   Procedure: LOOP RECORDER INSERTION;  Surgeon: Regan Lemming, MD;  Location: [facility];  Service: Cardiovascular;  Laterality: N/A;   TOTAL HIP ARTHROPLASTY Right 11/23/2011   TOTAL HIP ARTHROPLASTY  Left 09/07/2011   TOTAL KNEE ARTHROPLASTY Right 02/15/2018   VASECTOMY  1978   Social History   Socioeconomic History   Marital status: Married    Spouse name: Not on file   Number of children: Not on file   Years of education: Not on file   Highest education level: Not on file  Occupational History   Occupation: retired  Tobacco Use   Smoking status: Never   Smokeless tobacco: Never  Vaping Use   Vaping status: Never Used  Substance and Sexual Activity   Alcohol use: Not Currently    Comment: past   Drug use: Never   Sexual activity: Not on file  Other Topics Concern  Not on file  Social History Narrative   Not on file   Social Determinants of Health   Financial Resource Strain: Low Risk  (10/22/2022)   Overall Financial Resource Strain (CARDIA)    Difficulty of Paying Living Expenses: Not hard at all  Food Insecurity: No Food Insecurity (10/22/2022)   Hunger Vital Sign    Worried About Running Out of Food in the Last Year: Never true    Ran Out of Food in the Last Year: Never true  Transportation Needs: No Transportation Needs (10/22/2022)   PRAPARE - Administrator, Civil Service (Medical): No    Lack of Transportation (Non-Medical): No  Physical Activity: Sufficiently Active (10/22/2022)   Exercise Vital Sign    Days of Exercise per Week: 6 days    Minutes of Exercise per Session: 30 min  Stress: No Stress Concern Present (10/22/2022)   [employer] of Occupational Health - Occupational Stress Questionnaire    Feeling of Stress : Not at all  Social Connections: Moderately Integrated (10/22/2022)   Social Connection and Isolation Panel [NHANES]    Frequency of Communication with Friends and Family: Three times a week    Frequency of Social Gatherings with Friends and Family: More than three times a week    Attends Religious Services: More than 4 times per year    Active Member of [employer] or Organizations: No    Attends Banker  Meetings: Never    Marital Status: Married  Catering manager Violence: Not At Risk (10/22/2022)   Humiliation, Afraid, Rape, and Kick questionnaire    Fear of Current or Ex-Partner: No    Emotionally Abused: No    Physically Abused: No    Sexually Abused: No   Family History  Problem Relation Age of Onset   Anxiety disorder Sister    Current Outpatient Medications on File Prior to Visit  Medication Sig   acetaminophen (TYLENOL) 650 MG CR tablet Take 1,300 mg by mouth at bedtime as needed.   albuterol (VENTOLIN HFA) 108 (90 Base) MCG/ACT inhaler Inhale 1-2 puffs into the lungs every 4 (four) hours as needed for wheezing or shortness of breath (cough).   aspirin EC 81 MG tablet Take 81 mg by mouth daily.   atenolol (TENORMIN) 50 MG tablet Take 25 mg by mouth daily.   atorvastatin (LIPITOR) 80 MG tablet Take 1 tablet (80 mg total) by mouth daily.   Calcium Carb-Cholecalciferol (CALCIUM 600 + D PO) Take 1 tablet by mouth daily.   Cholecalciferol (VITAMIN D) 50 MCG (2000 UT) tablet Take 2,000 Units by mouth daily.   clopidogrel (PLAVIX) 75 MG tablet Take 1 tablet (75 mg total) by mouth daily.   co-enzyme Q-10 30 MG capsule Take 100 mg by mouth daily.   colchicine 0.6 MG tablet For acute gout flare take 2 pills at once, then take a 3rd pill 2 hours later. Then take 1 pill daily for 7-10 days or until resolved.   diclofenac sodium (VOLTAREN) 1 % GEL Apply 2 g topically 4 (four) times daily as needed for pain. Foot pain   furosemide (LASIX) 80 MG tablet TAKE 1 TABLET(80 MG) BY MOUTH TWICE DAILY   ipratropium (ATROVENT) 0.06 % nasal spray Place 2 sprays into both nostrils 4 (four) times daily as needed for rhinitis.   levothyroxine (SYNTHROID) 125 MCG tablet TAKE 1 TABLET(125 MCG) BY MOUTH DAILY BEFORE BREAKFAST   loratadine (CLARITIN) 10 MG tablet Take 10 mg by mouth daily.  LORazepam (ATIVAN) 0.5 MG tablet Take 1 tablet (0.5 mg total) by mouth daily as needed for anxiety or sleep.    meclizine (ANTIVERT) 12.5 MG tablet Take 1 tablet (12.5 mg total) by mouth 3 (three) times daily as needed for dizziness.   montelukast (SINGULAIR) 10 MG tablet Take 1 tablet (10 mg total) by mouth at bedtime.   Multiple Vitamin (MULTIVITAMIN) tablet Take 1 tablet by mouth daily.   nitroGLYCERIN (NITROSTAT) 0.4 MG SL tablet Place 1 tablet (0.4 mg total) under the tongue every 5 (five) minutes as needed for chest pain.   PATADAY 0.1 % ophthalmic solution Place into both eyes as needed.    sennosides-docusate sodium (SENOKOT-S) 8.6-50 MG tablet Take 1-2 tablets by mouth daily as needed for constipation.   traZODone (DESYREL) 50 MG tablet Take 1-2 tablets (50-100 mg total) by mouth at bedtime.   clotrimazole-betamethasone (LOTRISONE) cream Apply twice a day for 1-2 weeks, may repeat if need in future (Patient not taking: Reported on 12/21/2022)   No current facility-administered medications on file prior to visit.    Review of Systems Per HPI unless specifically indicated above     Objective:    BP 110/64   Pulse 66   Ht 5\' 8"  (1.727 m)   Wt 214 lb (97.1 kg)   BMI 32.54 kg/m   Wt Readings from Last 3 Encounters:  12/21/22 214 lb (97.1 kg)  11/08/22 212 lb 4 oz (96.3 kg)  10/22/22 216 lb 12.8 oz (98.3 kg)    Physical Exam Vitals and nursing note reviewed.  Constitutional:      General: He is not in acute distress.    Appearance: He is well-developed. He is not diaphoretic.     Comments: Well-appearing, comfortable, cooperative  HENT:     Head: Normocephalic and atraumatic.  Eyes:     General:        Right eye: No discharge.        Left eye: No discharge.     Conjunctiva/sclera: Conjunctivae normal.  Neck:     Thyroid: No thyromegaly.  Cardiovascular:     Rate and Rhythm: Normal rate and regular rhythm.     Pulses: Normal pulses.     Heart sounds: Normal heart sounds. No murmur heard. Pulmonary:     Effort: Pulmonary effort is normal. No respiratory distress.     Breath  sounds: Normal breath sounds. No wheezing or rales.  Musculoskeletal:        General: Normal range of motion.     Cervical back: Normal range of motion and neck supple.  Lymphadenopathy:     Cervical: No cervical adenopathy.  Skin:    General: Skin is warm and dry.     Findings: No erythema or rash.  Neurological:     Mental Status: He is alert and oriented to person, place, and time. Mental status is at baseline.  Psychiatric:        Behavior: Behavior normal.     Comments: Well groomed, good eye contact, normal speech and thoughts     Results for orders placed or performed in visit on 12/15/22  T4, free  Result Value Ref Range   Free T4 1.3 0.8 - 1.8 ng/dL  TSH  Result Value Ref Range   TSH 0.92 0.40 - 4.50 mIU/L  PSA  Result Value Ref Range   PSA 1.16 < OR = 4.00 ng/mL  CBC with Differential/Platelet  Result Value Ref Range   WBC 7.3 3.8 - 10.8  Thousand/uL   RBC 4.16 (L) 4.20 - 5.80 Million/uL   Hemoglobin 13.1 (L) 13.2 - 17.1 g/dL   HCT 08.6 57.8 - 46.9 %   MCV 94.2 80.0 - 100.0 fL   MCH 31.5 27.0 - 33.0 pg   MCHC 33.4 32.0 - 36.0 g/dL   RDW 62.9 52.8 - 41.3 %   Platelets 225 140 - 400 Thousand/uL   MPV 9.6 7.5 - 12.5 fL   Neutro Abs 4,548 1,500 - 7,800 cells/uL   Absolute Lymphocytes 1,759 850 - 3,900 cells/uL   Absolute Monocytes 672 200 - 950 cells/uL   Eosinophils Absolute 241 15 - 500 cells/uL   Basophils Absolute 80 0 - 200 cells/uL   Neutrophils Relative % 62.3 %   Total Lymphocyte 24.1 %   Monocytes Relative 9.2 %   Eosinophils Relative 3.3 %   Basophils Relative 1.1 %  COMPLETE METABOLIC PANEL WITH GFR  Result Value Ref Range   Glucose, Bld 107 (H) 65 - 99 mg/dL   BUN 16 7 - 25 mg/dL   Creat 2.44 0.10 - 2.72 mg/dL   eGFR 80 > OR = 60 ZD/GUY/4.03K7   BUN/Creatinine Ratio SEE NOTE: 6 - 22 (calc)   Sodium 141 135 - 146 mmol/L   Potassium 3.8 3.5 - 5.3 mmol/L   Chloride 103 98 - 110 mmol/L   CO2 29 20 - 32 mmol/L   Calcium 9.0 8.6 - 10.3 mg/dL    Total Protein 6.8 6.1 - 8.1 g/dL   Albumin 3.8 3.6 - 5.1 g/dL   Globulin 3.0 1.9 - 3.7 g/dL (calc)   AG Ratio 1.3 1.0 - 2.5 (calc)   Total Bilirubin 0.6 0.2 - 1.2 mg/dL   Alkaline phosphatase (APISO) 52 35 - 144 U/L   AST 15 10 - 35 U/L   ALT 14 9 - 46 U/L  Lipid panel  Result Value Ref Range   Cholesterol 104 <200 mg/dL   HDL 39 (L) > OR = 40 mg/dL   Triglycerides 74 <425 mg/dL   LDL Cholesterol (Calc) 50 mg/dL (calc)   Total CHOL/HDL Ratio 2.7 <5.0 (calc)   Non-HDL Cholesterol (Calc) 65 <956 mg/dL (calc)  Hemoglobin L8V  Result Value Ref Range   Hgb A1c MFr Bld 6.1 (H) <5.7 % of total Hgb   Mean Plasma Glucose 128 mg/dL   eAG (mmol/L) 7.1 mmol/L      Assessment & Plan:   Problem List Items Addressed This Visit     Anxiety   Relevant Medications   escitalopram (LEXAPRO) 20 MG tablet   CAD (coronary artery disease)   Hypothyroidism   Mixed hyperlipidemia   Pre-diabetes   Primary osteoarthritis of both knees   Relevant Orders   Ambulatory referral to Orthopedic Surgery   Systolic CHF [facility])   Other Visit Diagnoses     Annual physical exam    -  Primary   Essential hypertension       Need for Streptococcus pneumoniae vaccination       Relevant Orders   Pneumococcal conjugate vaccine 20-valent (Completed)   Psychophysiologic insomnia       Relevant Medications   escitalopram (LEXAPRO) 20 MG tablet   Chronic pain of left knee       Relevant Medications   escitalopram (LEXAPRO) 20 MG tablet   Other Relevant Orders   Ambulatory referral to Orthopedic Surgery   Chronic left shoulder pain       Relevant Medications   escitalopram (LEXAPRO) 20 MG tablet  Other Relevant Orders   Ambulatory referral to Orthopedic Surgery        Updated Health Maintenance information Reviewed recent lab results with patient Encouraged improvement to lifestyle with diet and exercise Goal of weight loss   Prediabetes Stable HbA1c at 6.1, consistent with previous  results. -Continue current management.  Hyperlipidemia LDL cholesterol at 50, below the goal of 55. -Continue current lipid-lowering therapy.  Hypothyroidism Thyroid function within normal range on Levothyroxine . -Continue Levothyroxine daily.  Anxiety Infrequent use of Lorazepam as needed. -Continue Lorazepam as needed for anxiety. Adjust SSRI Previously on Escitalopram 10mg  x 2 = 20mg  daily Refilled Escitalopram Lexapro for 20mg  (now this is the higher strength) take ONE daily, not two. New rx 90 day sent, if they give you 30 day, then please talk to them about a mail order pharmacy.   Vaccination Status Due for Prevnar 20 (pneumonia vaccine). -Administer Prevnar 20 today.  Knee Osteoarthritis Patient reports left knee pain, previously managed with gel injections. -Refer to orthopedics for evaluation and potential gel injections to delay knee replacement.  Shoulder Pain Patient reports intermittent left shoulder pain. -Include left shoulder in referral to orthopedics for evaluation and potential treatment.  Medication Management Patient reports difficulty with medication management, specifically with Lexapro dosing. -Change Lexapro prescription to 20mg  daily to simplify dosing. -Consider consultation with pharmacy team for potential pill pack arrangement.      Follow up with Clinical pharmacy for consider pill pack option for med admin organization    Orders Placed This Encounter  Procedures   Pneumococcal conjugate vaccine 20-valent   Ambulatory referral to Orthopedic Surgery    Referral Priority:   Routine    Referral Type:   Surgical    Referral Reason:   Specialty Services Required    Requested Specialty:   Orthopedic Surgery    Number of Visits Requested:   1    Meds ordered this encounter  Medications   escitalopram (LEXAPRO) 20 MG tablet    Sig: Take 1 tablet (20 mg total) by mouth daily.    Dispense:  90 tablet    Refill:  3      Follow up plan: Return in about 3 months (around 03/21/2023) for 3 month follow-up - afternoon 1st after lunch.  Saralyn Pilar, DO [facility] 12/21/2022, 1:49 PM

## 2022-12-21 NOTE — Patient Instructions (Addendum)
Thank you for coming to the office today.  Labs look great overall. No major changes today Keep on current diuretic schedule with Lasix 80mg  twice a day, and reduce to Lasix 80mg  once if prefer to not take double dose.  Refilled Escitalopram Lexapro for 20mg  (now this is the higher strength) take ONE daily, not two. New rx 90 day sent, if they give you 30 day, then please talk to them about a mail order pharmacy.  Pneumonia vaccine prevnar-20 today.  Please schedule a Follow-up Appointment to: Return in about 3 months (around 03/21/2023) for 3 month follow-up - afternoon 1st after lunch.  If you have any other questions or concerns, please feel free to call the office or send a message through MyChart. You may also schedule an earlier appointment if necessary.  Additionally, you may be receiving a survey about your experience at our office within a few days to 1 week by e-mail or mail. We value your feedback.  Saralyn Pilar, DO [facility], [place]

## 2022-12-22 ENCOUNTER — Encounter: Payer: Self-pay | Admitting: Family Medicine

## 2022-12-23 ENCOUNTER — Ambulatory Visit: Payer: Medicare PPO

## 2022-12-23 DIAGNOSIS — R2681 Unsteadiness on feet: Secondary | ICD-10-CM

## 2022-12-23 DIAGNOSIS — R42 Dizziness and giddiness: Secondary | ICD-10-CM

## 2022-12-23 DIAGNOSIS — R2689 Other abnormalities of gait and mobility: Secondary | ICD-10-CM | POA: Diagnosis not present

## 2022-12-23 DIAGNOSIS — R296 Repeated falls: Secondary | ICD-10-CM | POA: Diagnosis not present

## 2022-12-23 NOTE — Therapy (Signed)
OUTPATIENT PHYSICAL THERAPY VESTIBULAR TREATMENT NOTE/RE-CERTIFICATION   Patient Name: Luke Mckee MRN: [id number] DOB:Jul 23, 1940, 82 y.o., male Today's Date: 12/23/2022  PCP: [name], DO  REFERRING PROVIDER: Providence Crosby, PA-C   END OF SESSION:  PT End of Session - 12/23/22 1106     Visit Number 12    Number of Visits 20    Date for PT Re-Evaluation 02/18/23    Authorization Type [employer]    Progress Note Due on Visit 10    PT Start Time 1103    PT Stop Time 1147    PT Time Calculation (min) 44 min    Equipment Utilized During Treatment Gait belt    Activity Tolerance Patient tolerated treatment well    Behavior During Therapy WFL for tasks assessed/performed               Past Medical History:  Diagnosis Date   Anxiety    Arthritis    CAD (coronary artery disease) 2005   s/p CABG, DES   CHF (congestive heart failure) (HCC)    in EPIC care everywhere   Chronic kidney disease    COPD (chronic obstructive pulmonary disease) (HCC)    in EPIC care everywhere   Depression    Heart murmur    History of stroke    Hypothyroidism    OSA (obstructive sleep apnea)    Paroxysmal A-fib (HCC)    in EPIC careeverywhere   Past Surgical History:  Procedure Laterality Date   APPENDECTOMY     BUNIONECTOMY     CATARACT EXTRACTION     Right eye   CATARACT EXTRACTION W/PHACO Left 01/19/2021   Procedure: CATARACT EXTRACTION PHACO AND INTRAOCULAR LENS PLACEMENT (IOC) LEFT 3.09 00:28.3;  Surgeon: Nevada Crane, MD;  Location: [facility];  Service: Ophthalmology;  Laterality: Left;   COLONOSCOPY  07/06/2004   CORONARY ARTERY BYPASS GRAFT  2005   LOOP RECORDER INSERTION N/A 07/25/2018   Procedure: LOOP RECORDER INSERTION;  Surgeon: Regan Lemming, MD;  Location: [facility];  Service: Cardiovascular;  Laterality: N/A;   TOTAL HIP ARTHROPLASTY Right 11/23/2011   TOTAL HIP ARTHROPLASTY Left 09/07/2011   TOTAL KNEE ARTHROPLASTY  Right 02/15/2018   VASECTOMY  1978   Patient Active Problem List   Diagnosis Date Noted   Dizziness on standing 08/16/2022   Impaired fasting glucose 09/24/2021   Depression 09/24/2021   Sensorineural hearing loss, bilateral 09/24/2021   Hypothyroid 09/24/2021   Hyperlipidemia 09/24/2021   Sleep apnea 09/24/2021   Hallux limitus, acquired, right 11/12/2019   Pre-diabetes 10/17/2019   Recurrent major depressive disorder, in partial remission (HCC) 04/16/2019   Obesity (BMI 30.0-34.9) 04/16/2019   Vasomotor rhinitis 10/25/2018   Hearing loss 08/28/2018   Osteoarthritis 08/28/2018   Tinnitus 08/28/2018   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 (HCC) 07/31/2018   Slurred speech 07/23/2018   Right arm weakness 07/23/2018   Hypokalemia 07/23/2018   Anxiety 07/20/2018   Atherosclerosis of native coronary artery of native heart with stable angina pectoris (HCC) 06/16/2018   Hx of CABG 06/16/2018   Mixed hyperlipidemia 06/16/2018   Benign essential HTN 06/16/2018   History of cerebrovascular accident (CVA) with residual deficit 06/07/2018   Systolic CHF (HCC) 02/15/2018   Hypertension 02/15/2018   S/P total knee arthroplasty, right 02/15/2018   Primary osteoarthritis of both knees 11/25/2017   CAD (coronary artery disease) 11/12/2016   Abnormal nuclear stress test 06/30/2016   Hallux limitus of right  foot 10/22/2015   Anesthesia complication 10/10/2015   GERD (gastroesophageal reflux disease) 10/10/2015   Hypothyroidism 10/10/2015   Coronary artery disease involving native coronary artery of native heart with angina pectoris (HCC) 10/10/2015   OSA on CPAP 01/10/2007   Diagnosis unknown 01/12/2003    ONSET DATE: Referral Date: 08/13/2022 (Acute on Chronic Dizziness)  REFERRING DIAG: R42 (ICD-10-CM) - Dizziness on standing  THERAPY DIAG:  Dizziness and giddiness  Repeated falls  Unsteadiness on feet  Other abnormalities of gait and  mobility  Rationale for Evaluation and Treatment: Rehabilitation  PERTINENT HISTORY: Anxiety, Arthritis, CAD, CHF, CKD, COPD, Depression, Hx of CVA, Hypothyroidism, OSA, Paroxysmal A-Fib   PRECAUTIONS: None  SUBJECTIVE: Patient reports feeling much better. Had visit with MD and went very well. Denies falls since last visit. No pain to report.   PAIN:  Are you having pain? No  OBJECTIVE: GAIT: Gait pattern: step through pattern Distance walked: clinic distance with testing Assistive device utilized: None Level of assistance: Modified independence Comments: mild unsteadiness with DGI noted   FUNCTIONAL TESTS:  ABC Scale: 69% 30 seconds chair stand test: 12 sit > stands, no UE support from standard height chair 10 meter walk test: 1.14 m/s without AD Dynamic Gait Index: 18/24 (see below for details)  MCTSIB: Condition 1: Avg of 3 trials: 30 sec, Condition 2: Avg of 3 trials: 30 sec, Condition 3: Avg of 3 trials: 30 sec, and Condition 4: Avg of 3 trials: 10.5 sec    [facility] PT Assessment - 12/23/22 0001       Standardized Balance Assessment   Standardized Balance Assessment Dynamic Gait Index      Dynamic Gait Index   Level Surface Normal    Change in Gait Speed Normal    Gait with Horizontal Head Turns Moderate Impairment    Gait with Vertical Head Turns Mild Impairment    Gait and Pivot Turn Mild Impairment    Step Over Obstacle Mild Impairment    Step Around Obstacles Normal    Steps Mild Impairment    Total Score 18    DGI comment: 18/24: increased fall risk. No dizziness reported with completion of DGI.             Reviewed and Updated HEP:   Access Code: [email or web address] URL: [email or web address] Date: 12/23/2022 Prepared by: Nehemiah Settle Fairly  Exercises - Feet Together with Head Nods and EYES CLOSED  - 2 x daily - 5 x weekly - 1 sets - 10 reps - Feet Together with Head Rotation and EYES CLOSED  - 2 x daily - 5 x weekly - 1 sets - 10 reps - Romberg Stance  with Eyes Closed  - 2 x daily - 5 x weekly - 1 sets - 30 seconds hold - Sit to Stand Without Arm Support  - 2 x daily - 5 x weekly - 1 sets - 10 reps - Standing March with Counter Support  - 1 x daily - 5 x weekly - 2 sets - 10 reps - Heel Raises with Counter Support  - 1 x daily - 5 x weekly - 2 sets - 10 reps - Standing Hip Abduction with Counter Support  - 1 x daily - 5 x weekly - 2 sets - 10 reps - Walking with Head Turns (WITH SUPPORT)   - 1 x daily - 7 x weekly - 1 sets - 4-5 reps  Bolded additions are progression/additions added to HEP on 12/23/22. Patient verbalized and demonstrated understanding.  PATIENT EDUCATION: Education details: Progress toward LTGs; Updated HEP Person educated: Patient and Spouse Education method: Explanation Education comprehension: verbalized understanding   HOME EXERCISE PROGRAM: Access Code: [email or web address]   GOALS: Goals reviewed with patient? Yes     SHORT TERM GOALS: Target date: 11/26/2022   1.  Pt will be able to hold situation 4 of M-CTSIB for >/= 10 seconds Baseline: 7-8 seconds; 10.3 seconds Goal status: MET    LONG TERM GOALS: Target date: 12/24/2022   Pt will improve gait speed to >/= 1.2 m/sec to demonstrate improved community ambulation Baseline: 1.10 m/s; 1.15 m/s  Goal status: NOT MET   2.  Pt will improve 30 second STS test >/= 13 reps to demo improved functional LE strength and balance  Baseline: 11 reps; 12 reps Goal status: NOT MET   3. Pt will be able to hold situation 4 of M-CTSIB for >/= 15 seconds Baseline: 7-8 seconds; 10.5 seconds  Goal status: NOT MET   4.  Pt will be independent with final HEP for dynamic gait and balance to reduce fall risk.  Baseline: reports IND with HEP (will benefit from progressive HEP Goal status: MET  5.  Pt will improve DGI by 3 points from baseline to demonstrate improved balance and reduced fall risk Baseline: 15/24; 18/24 Goal status: MET Updated Goals:   SHORT TERM GOALS: Target  date: 01/21/2023   1.  Pt will improve 30 second STS test >/= 13 reps to demo improved functional LE strength and balance  Baseline: 11 reps; 12 reps Goal status: INITIAL    LONG TERM GOALS: Target date: 02/18/2023   Pt will improve gait speed to >/= 1.2 m/sec to demonstrate improved community ambulation Baseline: 1.10 m/s; 1.15 m/s  Goal status: ON-GOING   2.  Pt will improve ABC Scale to >/= 75% to demonstrate improved confidence/balance with daily activities Baseline: 69% Goal status: INITIAL   3. Pt will be able to hold situation 4 of M-CTSIB for >/= 15 seconds Baseline: 7-8 seconds; 10.5 seconds  Goal status: ON-GOING   4.  Pt will be independent with final HEP for dynamic gait and balance to reduce fall risk.  Baseline: reports IND with HEP (will benefit from progressive HEP) Goal status: ON-GOING  5.  Pt will improve DGI to >/= 19/24 to demonstrate improved balance and reduced fall risk Baseline: 15/24; 18/24 Goal status: ON-GOING ASSESSMENT:   CLINICAL IMPRESSION: Today's skilled PT session focused on assessment of patient's progress toward LTGs. Patient able to demonstrate progress toward all LTGs, however shy of meeting majority of goals at this time. Patient able to meet LTG #4 and 5. Patient improved 30 second chair stand test to 12 reps, gait speed to 1.14 m/s, and DGI to 18/24 demonstrating reduced fall risk. Rest of session spent updating/progressing HEP to patient's tolerance. Patient will continue to benefit from skilled PT services to address impairments and maximize functional mobility.   OBJECTIVE IMPAIRMENTS: Abnormal gait, decreased activity tolerance, decreased balance, decreased mobility, difficulty walking, decreased strength, decreased safety awareness, and dizziness.    ACTIVITY LIMITATIONS: bending, sitting, standing, transfers, bed mobility, and reach over head   PARTICIPATION LIMITATIONS: driving, community activity, and yard work   PERSONAL FACTORS:  Age, Time since onset of injury/illness/exacerbation, and 3+ comorbidities: Anxiety, Arthritis, CAD, CHF, CKD, COPD, Depression, Hx of CVA, Hypothyroidism, OSA, Paroxysmal A-Fib   are also affecting patient's functional outcome.    REHAB POTENTIAL: Good   CLINICAL DECISION MAKING: Evolving/moderate complexity  EVALUATION COMPLEXITY: Moderate     PLAN:   PT FREQUENCY: 1x/week   PT DURATION: 8 weeks   PLANNED INTERVENTIONS: Therapeutic exercises, Therapeutic activity, Neuromuscular re-education, Balance training, Gait training, Patient/Family education, Self Care, Joint mobilization, Stair training, Vestibular training, Canalith repositioning, DME instructions, Cryotherapy, Moist heat, Manual therapy, and Re-evaluation   PLAN FOR NEXT SESSION: continue dynamic balance activities; functional strengthening.    Howie Ill, PT, DPT 12/23/22 12:21 PM

## 2022-12-24 ENCOUNTER — Ambulatory Visit: Payer: Medicare PPO | Admitting: Pharmacist

## 2022-12-24 DIAGNOSIS — I10 Essential (primary) hypertension: Secondary | ICD-10-CM

## 2022-12-24 NOTE — Patient Instructions (Signed)
Goals Addressed             This Visit's Progress    Pharmacy Goals       It was great talking with you today!  Please follow up with [facility] as needed for questions about pill packaging   [facility] [address] [place], [place] [address] [phone]  Check your blood pressure twice weekly, and any time you have concerning symptoms like headache, chest pain, dizziness, shortness of breath, or vision changes.   To appropriately check your blood pressure, make sure you do the following:  1) Avoid caffeine, exercise, or tobacco products for 30 minutes before checking. Empty your bladder. 2) Sit with your back supported in a flat-backed chair. Rest your arm on something flat (arm of the chair, table, etc). 3) Sit still with your feet flat on the floor, resting, for at least 5 minutes.  4) Check your blood pressure. Take 1-2 readings.  5) Write down these readings and bring with you to any provider appointments.  Bring your home blood pressure machine with you to a provider's office for accuracy comparison at least once a year.   Make sure you take your blood pressure medications before you come to any office visit, even if you were asked to fast for labs.   Our goal bad cholesterol, or LDL, is less than 70 . This is why it is important to continue taking your atorvastatin  Feel free to call me with any questions or concerns. I look forward to our next call!   Estelle Grumbles, PharmD, [facility] Clinical Pharmacist [facility] [phone]

## 2022-12-24 NOTE — Progress Notes (Signed)
12/24/2022 Name: Luke Mckee MRN: [id number] DOB: 1940/02/10  Chief Complaint  Patient presents with   Medication Adherence    Luke Mckee is a 82 y.o. year old male who was referred to the pharmacist by their PCP for assistance in managing complex medication management and medication adherence.   Receive message from PCP requesting outreach to patient/spouse to discuss medication adherence/possible pill packaging  Speak with patient's wife by telephone today   Subjective:   Care Team: Primary Care Provider: [name], DO ; Next Scheduled Visit: 03/25/2023 Cardiologist: [name], MD Neurologist: Gelene Mink, PA Physical Therapist: Howie Ill, PT; Next Scheduled Appointment: 12/30/2022 Nurse Care Manager: Juanell Fairly, RN; Next Schedule Appointment: 12/28/2022   Medication Access/Adherence  Current Pharmacy:  [id number] - [place], [place] - [address] AT [facility] [address] [place] [address] Phone: [phone] Fax: [phone]   Patient reports affordability concerns with their medications: No  Patient reports access/transportation concerns to their pharmacy: No  Patient reports adherence concerns with their medications:  Yes    Uses weekly pillbox to organize patient's medications  [name] wife manages patient's medications, but notes recently has been more difficult to manage with more medication changes  Hypertension:   Current medications:  - atenolol 25 mg daily - furosemide 80 mg - Trialing taking once daily as discussed with PCP   Patient has a validated, automated, upper arm home BP cuff Denies checking home blood pressure since started reduced furosemide dose   Denies signs of hypotension, such as dizziness, since started physical therapy   Current Physical Activity: working with Physical Therapy through [facility]     Objective:  Lab Results   Component Value Date   HGBA1C 6.1 (H) 12/15/2022    Lab Results  Component Value Date   CREATININE 0.95 12/15/2022   BUN 16 12/15/2022   NA 141 12/15/2022   K 3.8 12/15/2022   CL 103 12/15/2022   CO2 29 12/15/2022    Lab Results  Component Value Date   CHOL 104 12/15/2022   HDL 39 (L) 12/15/2022   LDLCALC 50 12/15/2022   TRIG 74 12/15/2022   CHOLHDL 2.7 12/15/2022   BP Readings from Last 3 Encounters:  12/21/22 110/64  11/08/22 100/70  10/22/22 100/60   Pulse Readings from Last 3 Encounters:  12/21/22 66  11/08/22 69  08/13/22 67     Medications Reviewed Today     Reviewed by Manuela Neptune, RPH-CPP (Pharmacist) on 12/24/22 at 1148  Med List Status: <None>   Medication Order Taking? Sig Documenting Provider Last Dose Status Informant  acetaminophen (TYLENOL) 650 MG CR tablet [id number] Yes Take 1,300 mg by mouth at bedtime as needed. [provider] Taking Active Spouse/Significant Other  albuterol (VENTOLIN HFA) 108 (90 Base) MCG/ACT inhaler [id number] No Inhale 1-2 puffs into the lungs every 4 (four) hours as needed for wheezing or shortness of breath (cough).  Patient not taking: Reported on 12/24/2022   [name], DO Not Taking Active   aspirin EC 81 MG tablet [id number] Yes Take 81 mg by mouth daily. [provider] Taking Active   atenolol (TENORMIN) 25 MG tablet [id number] Yes Take 25 mg by mouth daily. [provider] Taking Active   atorvastatin (LIPITOR) 80 MG tablet [id number] Yes Take 1 tablet (80 mg total) by mouth daily. [name], DO Taking Active   Calcium  Carb-Cholecalciferol (CALCIUM 600 + D PO) [id number] Yes Take 1 tablet by mouth daily. [provider] Taking Active   Cholecalciferol (VITAMIN D) 50 MCG (2000 UT) tablet [id number] Yes Take 2,000 Units by mouth daily. [provider] Taking Active Spouse/Significant Other  clopidogrel (PLAVIX) 75 MG tablet [id number] Yes Take 1  tablet (75 mg total) by mouth daily. [name], DO Taking Active   clotrimazole-betamethasone (LOTRISONE) cream [id number]  Apply twice a day for 1-2 weeks, may repeat if need in future  Patient not taking: Reported on 12/21/2022   [name], DO  Active   Coenzyme Q10 100 MG TABS [id number] Yes Take 100 mg by mouth daily. [provider] Taking Active   colchicine 0.6 MG tablet [id number]  For acute gout flare take 2 pills at once, then take a 3rd pill 2 hours later. Then take 1 pill daily for 7-10 days or until resolved. [name], DO  Active   diclofenac sodium (VOLTAREN) 1 % GEL [id number] Yes Apply 2 g topically 4 (four) times daily as needed for pain. Foot pain [provider] Taking Active Spouse/Significant Other  escitalopram (LEXAPRO) 20 MG tablet [id number] Yes Take 1 tablet (20 mg total) by mouth daily. [name], DO Taking Active   furosemide (LASIX) 80 MG tablet [id number] Yes TAKE 1 TABLET(80 MG) BY MOUTH TWICE DAILY  Patient taking differently: Take 80 mg by mouth daily.   [name], DO Taking Active   ipratropium (ATROVENT) 0.06 % nasal spray [id number]  Place 2 sprays into both nostrils 4 (four) times daily as needed for rhinitis. [name], DO  Active   levothyroxine (SYNTHROID) 125 MCG tablet [id number] Yes TAKE 1 TABLET(125 MCG) BY MOUTH DAILY BEFORE BREAKFAST Karamalegos, Netta [name], DO Taking Active   loratadine (CLARITIN) 10 MG tablet [id number]  Take 10 mg by mouth daily as needed. [provider]  Active   LORazepam (ATIVAN) 0.5 MG tablet [id number]  Take 1 tablet (0.5 mg total) by mouth daily as needed for anxiety or sleep. Karamalegos, Netta [name], DO  Active   meclizine (ANTIVERT) 12.5 MG tablet [id number]  Take 1 tablet (12.5 mg total) by mouth 3 (three) times daily as needed for dizziness. Karamalegos, Netta [name], DO  Active   montelukast (SINGULAIR) 10 MG tablet  [id number] Yes Take 1 tablet (10 mg total) by mouth at bedtime. [name], DO Taking Active   Multiple Vitamin (MULTIVITAMIN) tablet [id number]  Take 1 tablet by mouth daily. [provider]  Active Spouse/Significant Other  nitroGLYCERIN (NITROSTAT) 0.4 MG SL tablet [id number]  Place 1 tablet (0.4 mg total) under the tongue every 5 (five) minutes as needed for chest pain. Karamalegos, Netta [name], DO  Active   PATADAY 0.1 % ophthalmic solution [id number] Yes Place into both eyes as needed.  [provider] Taking Active   sennosides-docusate sodium (SENOKOT-S) 8.6-50 MG tablet [id number]  Take 1-2 tablets by mouth daily as needed for constipation. [provider]  Active   traZODone (DESYREL) 50 MG tablet [id number] Yes Take 1-2 tablets (50-100 mg total) by mouth at bedtime.  Patient taking differently: Take 50 mg by mouth at bedtime.   Mecum, Oswaldo Conroy, PA-C Taking Active               Assessment/Plan:   Comprehensive medication review performed; medication list updated in electronic medical record  Hypertension: - Reviewed long term cardiovascular and renal outcomes of uncontrolled blood pressure - Remind patient  to continue to take positional changes slowly - Recommend to continue to monitor home blood pressure, keep log of results and have this record to review at upcoming medical appointments. Patient to contact provider office sooner if needed for readings outside of established parameters or symptoms    Medication Management: - Discussed collaboration with local pharmacies for adherence packaging. Reviewed local pharmacies with adherence packaging options.  Note patient has both [employer] and [employer] prescription coverage. Per review of pharmacy list from [employer], appears that [facility] is in-network - Place 3-way call to [facility] today with caregiver on the line to address patient's questions regarding cost of pill packaging  and delivery services.  Caregiver elects to plan to initiate pill packaging with [facility] once has determined if patient will maintain current furosemide dose - Agree to follow up in 3 weeks to determine whether to move forward will pill packaging plan Until then, caregiver plans to continue to use weekly pillbox and copy of med list from latest office visit to aid with medication adherence   Follow Up Plan: Clinical Pharmacist will follow up with patient by telephone on 01/10/2023 at 11:00 AM   Estelle Grumbles, PharmD, [facility] [place], CPP Clinical Pharmacist [facility] [phone]

## 2022-12-28 ENCOUNTER — Telehealth: Payer: Self-pay

## 2022-12-28 ENCOUNTER — Other Ambulatory Visit: Payer: Self-pay

## 2022-12-28 NOTE — Patient Outreach (Addendum)
Care Management   Visit Note  12/28/2022 Name: Luke Mckee MRN: [id number] DOB: January 23, 1940  Subjective: Luke Mckee is a 82 y.o. year old male who is a primary care patient of [name], DO. The Care Management team was consulted for assistance.      Engaged with Luke Mckee and his spouse/caregiver via telephone.  Assessment:  Review of patient past medical history, allergies, medications, health status, including review of consultants reports, laboratory and other test data, was performed as part of  evaluation and provision of care management services.    Outpatient Encounter Medications as of 12/28/2022  Medication Sig Note   acetaminophen (TYLENOL) 650 MG CR tablet Take 1,300 mg by mouth at bedtime as needed.    aspirin EC 81 MG tablet Take 81 mg by mouth daily.    atenolol (TENORMIN) 25 MG tablet Take 25 mg by mouth daily.    atorvastatin (LIPITOR) 80 MG tablet Take 1 tablet (80 mg total) by mouth daily.    Calcium Carb-Cholecalciferol (CALCIUM 600 + D PO) Take 1 tablet by mouth daily.    Cholecalciferol (VITAMIN D) 50 MCG (2000 UT) tablet Take 2,000 Units by mouth daily.    clopidogrel (PLAVIX) 75 MG tablet Take 1 tablet (75 mg total) by mouth daily.    Coenzyme Q10 100 MG TABS Take 100 mg by mouth daily.    colchicine 0.6 MG tablet For acute gout flare take 2 pills at once, then take a 3rd pill 2 hours later. Then take 1 pill daily for 7-10 days or until resolved.    escitalopram (LEXAPRO) 20 MG tablet Take 1 tablet (20 mg total) by mouth daily.    ipratropium (ATROVENT) 0.06 % nasal spray Place 2 sprays into both nostrils 4 (four) times daily as needed for rhinitis.    levothyroxine (SYNTHROID) 125 MCG tablet TAKE 1 TABLET(125 MCG) BY MOUTH DAILY BEFORE BREAKFAST    loratadine (CLARITIN) 10 MG tablet Take 10 mg by mouth daily as needed.    LORazepam (ATIVAN) 0.5 MG tablet Take 1 tablet (0.5 mg total) by mouth daily as needed for anxiety or sleep.    meclizine  (ANTIVERT) 12.5 MG tablet Take 1 tablet (12.5 mg total) by mouth 3 (three) times daily as needed for dizziness.    montelukast (SINGULAIR) 10 MG tablet Take 1 tablet (10 mg total) by mouth at bedtime.    Multiple Vitamin (MULTIVITAMIN) tablet Take 1 tablet by mouth daily.    PATADAY 0.1 % ophthalmic solution Place into both eyes as needed.     sennosides-docusate sodium (SENOKOT-S) 8.6-50 MG tablet Take 1-2 tablets by mouth daily as needed for constipation.    traZODone (DESYREL) 50 MG tablet Take 1-2 tablets (50-100 mg total) by mouth at bedtime. (Patient taking differently: Take 50 mg by mouth at bedtime.)    albuterol (VENTOLIN HFA) 108 (90 Base) MCG/ACT inhaler Inhale 1-2 puffs into the lungs every 4 (four) hours as needed for wheezing or shortness of breath (cough). (Patient not taking: Reported on 12/24/2022) 12/28/2022: Reports not using frequently   clotrimazole-betamethasone (LOTRISONE) cream Apply twice a day for 1-2 weeks, may repeat if need in future (Patient not taking: Reported on 12/21/2022)    diclofenac sodium (VOLTAREN) 1 % GEL Apply 2 g topically 4 (four) times daily as needed for pain. Foot pain    furosemide (LASIX) 80 MG tablet TAKE 1 TABLET(80 MG) BY MOUTH TWICE DAILY (Patient taking differently: Take 80 mg by mouth daily.) 12/28/2022: Reports taking differently.  Reports he can take a second tab prn for swelling   nitroGLYCERIN (NITROSTAT) 0.4 MG SL tablet Place 1 tablet (0.4 mg total) under the tongue every 5 (five) minutes as needed for chest pain.    No facility-administered encounter medications on file as of 12/28/2022.      Goals      Monitor, Self-Manage and Reduce Symptoms of Heart Failure     Current Barriers:  Care Management support and education needs related to CHF  Planned Interventions: Reviewed current treatment plan related to hypertension self management and patient's adherence to plan as established by provider. Reviewed medications. Reports taking as  prescribed. Spouse is assisting with medication preparation.  Reviewed information regarding recommended weight parameters. Encouraged to weigh daily and record readings.  Encouraged to notify provider for weight gain greater than 2 lbs overnight or weight gain greater than 5 lbs within a week. Reports weighing as instructed. Reports have been within range. Weight today was 211.  Discussed s/sx of fluid overload and indications for notifying a provider. Experiences exertional dyspnea. He is attending vestibular therapy and feels that overall activity tolerance has improved. Denies abdominal swelling. Reports occasional swelling in lower extremities. Reports he is taking diuretic as prescribed and was advised by the Cardiology team that an additional tablet could be taken for increased edema. Reports not requiring additional diuretic for several weeks. Reviewed safety and fall prevention measures. Reports discussing with the Cardiology team. Encouraged to assess symptoms daily and contact clinic with changes. He will complete a Stress Test in February.  Discussed nutritional intake. Advised to closely monitor sodium consumption and avoid highly processed foods when possible. Reviewed worsening s/sx related to CHF exacerbation and indications for seeking immediate medical attention.   Wt Readings from Last 3 Encounters:  12/21/22 214 lb (97.1 kg)  11/08/22 212 lb 4 oz (96.3 kg)  10/22/22 216 lb 12.8 oz (98.3 kg)     Patient Goals/Self-Care Activities:  Take all medications as prescribed Attend all scheduled provider visits Monitor weight and record readings Notify provider for weight gain outside of established parameters Follow recommended safety and fall prevention measures Adhere to recommended cardiac prudent/heart healthy diet Contact provider or care management team with questions and new concerns as needed.      Monitor, Self-Manage, and Reduce Symptoms of Hypertension     Current  Barriers:  Care Management support and education needs related to Hypertension  Planned Interventions: Reviewed current treatment plan related to Hypertension, self-management, and adherence to plan as established by provider.  Reviewed medications and indications for use. Reports taking medications as prescribed. Spouse is assisting with preparing medications. Provided information regarding established blood pressure parameters along with indications for notifying a provider. Reports not monitoring daily. Advised to monitor a few times a week if unable to monitor daily and record readings.  Reviewed symptoms. Denies chest pain or palpitations. Denies headaches, visual changes.  Reports a few episodes of dizziness. Discussed activity tolerance. Reports an accidental fall within the last few weeks. Reports fall was not due to fatigue or balance was  occurred while attempting to carry groceries and tripping over the threshold in the entryway of the home. Reports completing vestibular therapy sessions as scheduled and this has been very helpful. Reviewed safety and fall prevention measure. Advised to consider use of an assistive device as needed if changes with balance are noted.  Discussed compliance with recommended cardiac prudent diet. Encouraged to read nutrition labels, continue monitoring sodium intake, and avoid highly  processed foods when possible.  Reviewed pending appointments. Reports a pending Stress Test on 03/10/23. Reviewed s/sx of heart attack, stroke and worsening symptoms that require immediate medical attention.     BP Readings from Last 3 Encounters:  12/21/22 110/64  11/08/22 100/70  10/22/22 100/60    Symptom Management: Take medications as prescribed   Attend all scheduled provider appointments Call pharmacy for medication refills 3-7 days in advance of running out of medications Check blood pressure and record readings Call doctor if your blood pressure is outside of  established parameters Follow recommended safety and fall prevention measures Call provider office for new concerns or questions           PLAN Will follow up in two months   Juanell Fairly [facility] RN Care Manager Direct Dial: [phone]  Fax: [phone] Website: [email or web address]

## 2022-12-28 NOTE — Patient Outreach (Signed)
  Care Management   Outreach Note  12/28/2022 Name: Rowan Bouwkamp MRN: [id number] DOB: Mar 28, 1940  An unsuccessful outreach attempt was made today for a scheduled Care Management visit.    Follow Up Plan:  A HIPAA compliant phone message was left for the patient providing contact information and requesting a return call.    [facility]  [facility] RN Care Manager Direct Dial: [phone] Website: [email or web address]

## 2022-12-30 ENCOUNTER — Ambulatory Visit: Payer: Medicare PPO

## 2022-12-30 DIAGNOSIS — R296 Repeated falls: Secondary | ICD-10-CM

## 2022-12-30 DIAGNOSIS — R2689 Other abnormalities of gait and mobility: Secondary | ICD-10-CM

## 2022-12-30 DIAGNOSIS — R2681 Unsteadiness on feet: Secondary | ICD-10-CM | POA: Diagnosis not present

## 2022-12-30 DIAGNOSIS — R42 Dizziness and giddiness: Secondary | ICD-10-CM

## 2022-12-30 NOTE — Therapy (Signed)
OUTPATIENT PHYSICAL THERAPY VESTIBULAR TREATMENT NOTE   Patient Name: Luke Mckee MRN: [id number] DOB:09/30/1940, 82 y.o., male Today's Date: 12/30/2022  PCP: [name], DO  REFERRING PROVIDER: Providence Crosby, PA-C   END OF SESSION:  PT End of Session - 12/30/22 1057     Visit Number 13    Number of Visits 20    Date for PT Re-Evaluation 02/18/23    Authorization Type [employer]    Progress Note Due on Visit 20    PT Start Time 1100    PT Stop Time 1145    PT Time Calculation (min) 45 min    Equipment Utilized During Treatment Gait belt    Activity Tolerance Patient tolerated treatment well    Behavior During Therapy WFL for tasks assessed/performed             Past Medical History:  Diagnosis Date   Anxiety    Arthritis    CAD (coronary artery disease) 2005   s/p CABG, DES   CHF (congestive heart failure) (HCC)    in EPIC care everywhere   Chronic kidney disease    COPD (chronic obstructive pulmonary disease) (HCC)    in EPIC care everywhere   Depression    Heart murmur    History of stroke    Hypothyroidism    OSA (obstructive sleep apnea)    Paroxysmal A-fib (HCC)    in EPIC careeverywhere   Past Surgical History:  Procedure Laterality Date   APPENDECTOMY     BUNIONECTOMY     CATARACT EXTRACTION     Right eye   CATARACT EXTRACTION W/PHACO Left 01/19/2021   Procedure: CATARACT EXTRACTION PHACO AND INTRAOCULAR LENS PLACEMENT (IOC) LEFT 3.09 00:28.3;  Surgeon: Nevada Crane, MD;  Location: [facility];  Service: Ophthalmology;  Laterality: Left;   COLONOSCOPY  07/06/2004   CORONARY ARTERY BYPASS GRAFT  2005   LOOP RECORDER INSERTION N/A 07/25/2018   Procedure: LOOP RECORDER INSERTION;  Surgeon: Regan Lemming, MD;  Location: [facility];  Service: Cardiovascular;  Laterality: N/A;   TOTAL HIP ARTHROPLASTY Right 11/23/2011   TOTAL HIP ARTHROPLASTY Left 09/07/2011   TOTAL KNEE ARTHROPLASTY Right 02/15/2018    VASECTOMY  1978   Patient Active Problem List   Diagnosis Date Noted   Dizziness on standing 08/16/2022   Impaired fasting glucose 09/24/2021   Depression 09/24/2021   Sensorineural hearing loss, bilateral 09/24/2021   Hypothyroid 09/24/2021   Hyperlipidemia 09/24/2021   Sleep apnea 09/24/2021   Hallux limitus, acquired, right 11/12/2019   Pre-diabetes 10/17/2019   Recurrent major depressive disorder, in partial remission (HCC) 04/16/2019   Obesity (BMI 30.0-34.9) 04/16/2019   Vasomotor rhinitis 10/25/2018   Hearing loss 08/28/2018   Osteoarthritis 08/28/2018   Tinnitus 08/28/2018   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 (HCC) 07/31/2018   Slurred speech 07/23/2018   Right arm weakness 07/23/2018   Hypokalemia 07/23/2018   Anxiety 07/20/2018   Atherosclerosis of native coronary artery of native heart with stable angina pectoris (HCC) 06/16/2018   Hx of CABG 06/16/2018   Mixed hyperlipidemia 06/16/2018   Benign essential HTN 06/16/2018   History of cerebrovascular accident (CVA) with residual deficit 06/07/2018   Systolic CHF (HCC) 02/15/2018   Hypertension 02/15/2018   S/P total knee arthroplasty, right 02/15/2018   Primary osteoarthritis of both knees 11/25/2017   CAD (coronary artery disease) 11/12/2016   Abnormal nuclear stress test 06/30/2016   Hallux limitus of right foot 10/22/2015  Anesthesia complication 10/10/2015   GERD (gastroesophageal reflux disease) 10/10/2015   Hypothyroidism 10/10/2015   Coronary artery disease involving native coronary artery of native heart with angina pectoris (HCC) 10/10/2015   OSA on CPAP 01/10/2007   Diagnosis unknown 01/12/2003    ONSET DATE: Referral Date: 08/13/2022 (Acute on Chronic Dizziness)  REFERRING DIAG: R42 (ICD-10-CM) - Dizziness on standing  THERAPY DIAG:  Dizziness and giddiness  Repeated falls  Unsteadiness on feet  Other abnormalities of gait and mobility  Rationale for Evaluation  and Treatment: Rehabilitation  PERTINENT HISTORY: Anxiety, Arthritis, CAD, CHF, CKD, COPD, Depression, Hx of CVA, Hypothyroidism, OSA, Paroxysmal A-Fib   PRECAUTIONS: None  SUBJECTIVE: Patient reports he feels like he does not have any energy. Just been tired. Denies falls.   PAIN:  Are you having pain? No  Vitals:   OBJECTIVE: GAIT: Gait pattern: step through pattern Distance walked: into/out of clinic Assistive device utilized: None Level of assistance: Modified independence Comments: mild unsteadiness with increased gait speed noted; cues for pacing.   Neuro Re-ED Standing Balance: Surface: Airex Position: Feet Hip Width Apart Completed with: Eyes Open; Head Turns x 10 Reps and Head Nods x 10 Reps; completed x 2 sets. Then completed without head turns, and eyes closed 3 x 30 seconds. Intermittent sway noted.   Rockerboard: Standing with Rockerboard A/P, completed working on maintaining steady with intermittent support. Able to maintain <10 seconds without support.  Increased posterior lean, require cues for anterior weight shift. CGA to Min A due to poor balance on Rockerboard.   Tandem Stance:  Surface: Floor Completed with: Eyes Open; alternating foot position  Time: 30 seconds, CGA, mild unsteadiness.   Standing March: without UE support, working on slow controlled standing marching to promote improved SLS/balance.   Toe Taps: standing toe taps to blaze pod (multi color added for cognition challenge, Red = Righ Foot, Green = Left Foot). Intermittent challenge, requiring cues for use of proper LE. Increased balance challenge with crossover. CGA.    Lateral Step Overs: Completed lateral step over with hurdles with BLE, cues for posture. Completed x 10 reps bilaterally. Patient intermittent compensation, circumduction foot around target. Cues required for proper completion.    PATIENT EDUCATION: Education details: Continue HEP Person educated: Patient and  Spouse Education method: Explanation Education comprehension: verbalized understanding   HOME EXERCISE PROGRAM: Access Code: [email or web address]   GOALS: Goals reviewed with patient? Yes     SHORT TERM GOALS: Target date: 11/26/2022   1.  Pt will be able to hold situation 4 of M-CTSIB for >/= 10 seconds Baseline: 7-8 seconds; 10.3 seconds Goal status: MET    LONG TERM GOALS: Target date: 12/24/2022   Pt will improve gait speed to >/= 1.2 m/sec to demonstrate improved community ambulation Baseline: 1.10 m/s; 1.15 m/s  Goal status: NOT MET   2.  Pt will improve 30 second STS test >/= 13 reps to demo improved functional LE strength and balance  Baseline: 11 reps; 12 reps Goal status: NOT MET   3. Pt will be able to hold situation 4 of M-CTSIB for >/= 15 seconds Baseline: 7-8 seconds; 10.5 seconds  Goal status: NOT MET   4.  Pt will be independent with final HEP for dynamic gait and balance to reduce fall risk.  Baseline: reports IND with HEP (will benefit from progressive HEP Goal status: MET  5.  Pt will improve DGI by 3 points from baseline to demonstrate improved balance and reduced fall risk Baseline: 15/24;  18/24 Goal status: MET Updated Goals:   SHORT TERM GOALS: Target date: 01/21/2023   1.  Pt will improve 30 second STS test >/= 13 reps to demo improved functional LE strength and balance  Baseline: 11 reps; 12 reps Goal status: INITIAL    LONG TERM GOALS: Target date: 02/18/2023   Pt will improve gait speed to >/= 1.2 m/sec to demonstrate improved community ambulation Baseline: 1.10 m/s; 1.15 m/s  Goal status: ON-GOING   2.  Pt will improve ABC Scale to >/= 75% to demonstrate improved confidence/balance with daily activities Baseline: 69% Goal status: INITIAL   3. Pt will be able to hold situation 4 of M-CTSIB for >/= 15 seconds Baseline: 7-8 seconds; 10.5 seconds  Goal status: ON-GOING   4.  Pt will be independent with final HEP for dynamic gait and balance to reduce  fall risk.  Baseline: reports IND with HEP (will benefit from progressive HEP) Goal status: ON-GOING  5.  Pt will improve DGI to >/= 19/24 to demonstrate improved balance and reduced fall risk Baseline: 15/24; 18/24 Goal status: ON-GOING ASSESSMENT:   CLINICAL IMPRESSION: Patient presenting to therapy session, reports has been feeling generalized fatigue and lack of energy/interest the past few days. Vitals stable. Agreeable to PT session, with focus on balance activities on complaint surfaces, more narrow BOS, weight shift and dynamic stepping activities. Patient tolerating well with intermittent rest breaks throughout due to fatigue. Patient continues to deny any dizziness over last couple visits, ensuring continued resolution. Will continue to progress toward all LTGs.   OBJECTIVE IMPAIRMENTS: Abnormal gait, decreased activity tolerance, decreased balance, decreased mobility, difficulty walking, decreased strength, decreased safety awareness, and dizziness.    ACTIVITY LIMITATIONS: bending, sitting, standing, transfers, bed mobility, and reach over head   PARTICIPATION LIMITATIONS: driving, community activity, and yard work   PERSONAL FACTORS: Age, Time since onset of injury/illness/exacerbation, and 3+ comorbidities: Anxiety, Arthritis, CAD, CHF, CKD, COPD, Depression, Hx of CVA, Hypothyroidism, OSA, Paroxysmal A-Fib   are also affecting patient's functional outcome.    REHAB POTENTIAL: Good   CLINICAL DECISION MAKING: Evolving/moderate complexity   EVALUATION COMPLEXITY: Moderate     PLAN:   PT FREQUENCY: 1x/week   PT DURATION: 8 weeks   PLANNED INTERVENTIONS: Therapeutic exercises, Therapeutic activity, Neuromuscular re-education, Balance training, Gait training, Patient/Family education, Self Care, Joint mobilization, Stair training, Vestibular training, Canalith repositioning, DME instructions, Cryotherapy, Moist heat, Manual therapy, and Re-evaluation   PLAN FOR NEXT  SESSION: Dynamic balance activities; Dynamic Gait; Functional strengthening. Rockerboard. Has posterior lean with challenging balance activities.    Howie Ill, PT, DPT 12/30/22 11:53 AM

## 2022-12-31 ENCOUNTER — Telehealth: Payer: Self-pay | Admitting: Cardiovascular Disease

## 2022-12-31 ENCOUNTER — Telehealth: Payer: Medicare PPO

## 2022-12-31 DIAGNOSIS — R0600 Dyspnea, unspecified: Secondary | ICD-10-CM

## 2022-12-31 NOTE — Telephone Encounter (Signed)
Pt c/o Shortness Of Breath: STAT if SOB developed within the last 24 hours or pt is noticeably SOB on the phone  1. Are you currently SOB (can you hear that pt is SOB on the phone)? No   2. How long have you been experiencing SOB? About a week   3. Are you SOB when sitting or when up moving around? When he is up moving around.    4. Are you currently experiencing any other symptoms? He stated he is really tired and fatigued.  No chest   Best number [phone]

## 2022-12-31 NOTE — Telephone Encounter (Signed)
Called patient and left detailed message with the following from Dr. Mariah Milling.  For shortness of breath, fatigue would recommend we order echocardiogram Last done 2020 After echo, we can arrange follow-up in our clinic to discuss if additional testing needed Thx TGollan

## 2022-12-31 NOTE — Telephone Encounter (Signed)
Returned the call to the patient. He stated he has been having increased shortness of breath and fatigue while doing daily activities. This has been getting worse for the past few weeks.   He currently takes furosemide 80 mg three times weekly but the wife thinks he has not been taking it all.   The patient denies swelling, denies shortness of breath at rest, denies weight gain and denies chest pain. Current weight is 208 lbs. Blood pressure and heart rate while on the phone was 103/63  and 63.   The patient did say that he saw a commercial on TV where the patient was having similar symptoms and required a stent.

## 2023-01-06 ENCOUNTER — Ambulatory Visit: Payer: Medicare PPO

## 2023-01-07 DIAGNOSIS — R234 Changes in skin texture: Secondary | ICD-10-CM | POA: Diagnosis not present

## 2023-01-07 DIAGNOSIS — M79675 Pain in left toe(s): Secondary | ICD-10-CM | POA: Diagnosis not present

## 2023-01-07 DIAGNOSIS — B351 Tinea unguium: Secondary | ICD-10-CM | POA: Diagnosis not present

## 2023-01-07 DIAGNOSIS — M79674 Pain in right toe(s): Secondary | ICD-10-CM | POA: Diagnosis not present

## 2023-01-10 ENCOUNTER — Other Ambulatory Visit: Payer: Medicare PPO | Admitting: Pharmacist

## 2023-01-10 ENCOUNTER — Other Ambulatory Visit: Payer: Self-pay | Admitting: Family Medicine

## 2023-01-10 DIAGNOSIS — E039 Hypothyroidism, unspecified: Secondary | ICD-10-CM

## 2023-01-10 NOTE — Patient Instructions (Signed)
Goals Addressed             This Visit's Progress    Pharmacy Goals       Please follow up with [facility] as needed for questions about pill packaging   [facility] [address], [place], [place] [address] [phone]  Check your blood pressure twice weekly, and any time you have concerning symptoms like headache, chest pain, dizziness, shortness of breath, or vision changes.   To appropriately check your blood pressure, make sure you do the following:  1) Avoid caffeine, exercise, or tobacco products for 30 minutes before checking. Empty your bladder. 2) Sit with your back supported in a flat-backed chair. Rest your arm on something flat (arm of the chair, table, etc). 3) Sit still with your feet flat on the floor, resting, for at least 5 minutes.  4) Check your blood pressure. Take 1-2 readings.  5) Write down these readings and bring with you to any provider appointments.  Bring your home blood pressure machine with you to a provider's office for accuracy comparison at least once a year.   Make sure you take your blood pressure medications before you come to any office visit, even if you were asked to fast for labs.   Our goal bad cholesterol, or LDL, is less than 70 . This is why it is important to continue taking your atorvastatin  Feel free to call me with any questions or concerns. I look forward to our next call!   Estelle Grumbles, PharmD, [facility] Clinical Pharmacist [facility] [phone]

## 2023-01-10 NOTE — Progress Notes (Signed)
   01/10/2023  Patient ID: Luke Mckee, male   DOB: 09-09-1940, 82 y.o.   MRN: [id number]  Outreach to patient/spouse today to follow up regarding medication adherence/possible pill packaging   [name] wife advises that they have not yet decided if they would like to switch to pill packaging. Currently patient's wife manages patient's medications using weekly pillboxes, but has noticed that this has become more difficult over time.  Patient request that I call back later, but is not available for call back this afternoon. Schedule follow up appointment as requested  Have previously reviewed the process for getting started with pill packaging if interested. Note patient has both [employer] and [employer] prescription coverage. Per review of pharmacy list from [employer], appears that [facility] is in-network    Plan:   1) Clinical Pharmacist will follow up with patient by telephone on 02/11/2023 at 10:30 AM   2) Patient/spouse to follow up with [facility] directly if interested in getting started with pill packaging sooner   Estelle Grumbles, PharmD, [facility] [place], CPP Clinical Pharmacist [facility] [phone]

## 2023-01-11 DIAGNOSIS — M25562 Pain in left knee: Secondary | ICD-10-CM | POA: Diagnosis not present

## 2023-01-11 DIAGNOSIS — M1712 Unilateral primary osteoarthritis, left knee: Secondary | ICD-10-CM | POA: Diagnosis not present

## 2023-01-11 DIAGNOSIS — G8929 Other chronic pain: Secondary | ICD-10-CM | POA: Diagnosis not present

## 2023-01-13 ENCOUNTER — Ambulatory Visit: Payer: Medicare PPO | Attending: Physician Assistant | Admitting: Physical Therapy

## 2023-01-13 DIAGNOSIS — R296 Repeated falls: Secondary | ICD-10-CM | POA: Diagnosis not present

## 2023-01-13 DIAGNOSIS — R42 Dizziness and giddiness: Secondary | ICD-10-CM | POA: Diagnosis not present

## 2023-01-13 DIAGNOSIS — R2689 Other abnormalities of gait and mobility: Secondary | ICD-10-CM | POA: Diagnosis not present

## 2023-01-13 DIAGNOSIS — R2681 Unsteadiness on feet: Secondary | ICD-10-CM | POA: Diagnosis not present

## 2023-01-13 NOTE — Therapy (Signed)
 OUTPATIENT PHYSICAL THERAPY TREATMENT NOTE   Patient Name: Luke Mckee MRN: [id number] DOB:04/04/1940, 83 y.o., male Today's Date: 01/13/2023  PCP: Edman Marsa PARAS, DO  REFERRING PROVIDER: Marylene Rocky BRAVO, PA-C   END OF SESSION:  PT End of Session - 01/13/23 1028     Visit Number 14    Number of Visits 20    Date for PT Re-Evaluation 02/18/23    Authorization Type [employer]    Progress Note Due on Visit 20    PT Start Time 1025    PT Stop Time 1104    PT Time Calculation (min) 39 min    Equipment Utilized During Treatment Gait belt    Activity Tolerance Patient tolerated treatment well    Behavior During Therapy WFL for tasks assessed/performed              Past Medical History:  Diagnosis Date   Anxiety    Arthritis    CAD (coronary artery disease) 2005   s/p CABG, DES   CHF (congestive heart failure) (HCC)    in EPIC care everywhere   Chronic kidney disease    COPD (chronic obstructive pulmonary disease) (HCC)    in EPIC care everywhere   Depression    Heart murmur    History of stroke    Hypothyroidism    OSA (obstructive sleep apnea)    Paroxysmal A-fib (HCC)    in EPIC careeverywhere   Past Surgical History:  Procedure Laterality Date   APPENDECTOMY     BUNIONECTOMY     CATARACT EXTRACTION     Right eye   CATARACT EXTRACTION W/PHACO Left 01/19/2021   Procedure: CATARACT EXTRACTION PHACO AND INTRAOCULAR LENS PLACEMENT (IOC) LEFT 3.09 00:28.3;  Surgeon: Myrna Adine Anes, MD;  Location: [facility];  Service: Ophthalmology;  Laterality: Left;   COLONOSCOPY  07/06/2004   CORONARY ARTERY BYPASS GRAFT  2005   LOOP RECORDER INSERTION N/A 07/25/2018   Procedure: LOOP RECORDER INSERTION;  Surgeon: Inocencio Soyla Lunger, MD;  [facility];  Service: Cardiovascular;  Laterality: N/A;   TOTAL HIP ARTHROPLASTY Right 11/23/2011   TOTAL HIP ARTHROPLASTY Left 09/07/2011   TOTAL KNEE ARTHROPLASTY Right 02/15/2018   VASECTOMY  1978    Patient Active Problem List   Diagnosis Date Noted   Dizziness on standing 08/16/2022   Impaired fasting glucose 09/24/2021   Depression 09/24/2021   Sensorineural hearing loss, bilateral 09/24/2021   Hypothyroid 09/24/2021   Hyperlipidemia 09/24/2021   Sleep apnea 09/24/2021   Hallux limitus, acquired, right 11/12/2019   Pre-diabetes 10/17/2019   Recurrent major depressive disorder, in partial remission (HCC) 04/16/2019   Obesity (BMI 30.0-34.9) 04/16/2019   Vasomotor rhinitis 10/25/2018   Hearing loss 08/28/2018   Osteoarthritis 08/28/2018   Tinnitus 08/28/2018   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 (HCC) 07/31/2018   Slurred speech 07/23/2018   Right arm weakness 07/23/2018   Hypokalemia 07/23/2018   Anxiety 07/20/2018   Atherosclerosis of native coronary artery of native heart with stable angina pectoris (HCC) 06/16/2018   Hx of CABG 06/16/2018   Mixed hyperlipidemia 06/16/2018   Benign essential HTN 06/16/2018   History of cerebrovascular accident (CVA) with residual deficit 06/07/2018   Systolic CHF (HCC) 02/15/2018   Hypertension 02/15/2018   S/P total knee arthroplasty, right 02/15/2018   Primary osteoarthritis of both knees 11/25/2017   CAD (coronary artery disease) 11/12/2016   Abnormal nuclear stress test 06/30/2016   Hallux limitus of right foot 10/22/2015  Anesthesia complication 10/10/2015   GERD (gastroesophageal reflux disease) 10/10/2015   Hypothyroidism 10/10/2015   Coronary artery disease involving native coronary artery of native heart with angina pectoris (HCC) 10/10/2015   OSA on CPAP 01/10/2007   Diagnosis unknown 01/12/2003    ONSET DATE: Referral Date: 08/13/2022 (Acute on Chronic Dizziness)  REFERRING DIAG: R42 (ICD-10-CM) - Dizziness on standing  THERAPY DIAG:  No diagnosis found.  Rationale for Evaluation and Treatment: Rehabilitation  PERTINENT HISTORY: Anxiety, Arthritis, CAD, CHF, CKD, COPD, Depression,  Hx of CVA, Hypothyroidism, OSA, Paroxysmal A-Fib   PRECAUTIONS: None  SUBJECTIVE: Patient reports he feels okay, is apologetic for late arrival, pt arrives in transport chair. Pt reports no falls since last session. Pt caregiver reports she would like him to improve on his decline walking as he had difficulty controlling this recently.   PAIN:  Are you having pain? No  Vitals:   OBJECTIVE: GAIT: Gait pattern: step through pattern Distance walked: into/out of clinic Assistive device utilized: None Level of assistance: Modified independence Comments: mild unsteadiness with increased gait speed noted; cues for pacing.    Treatment  TE Nustel level 3 x 6 min UE and LE reciprocal movements  Ambulation with 4# AW x 320 ft  STS x 10  Ambulation with 4#AW x 320 ft  STS x 10   Neuro Re-ED Standing Balance:  Rockerboard: Standing with Rockerboard A/P, completed working on rocking minimally a/p while minimizing hip motion   Resisted gait with matrix cable machine and 12.5# resistance on belt x 4 laps ea direction   Airex stance - 1 LE on airex and one on step x 30 sec x 2 rounds ea LE   Tandem Stance:  Surface: Floor Completed with: Eyes Open; alternating foot position  Time: 30 seconds, CGA, mild unsteadiness.   Standing March: without UE support, working on slow controlled standing marching to promote improved SLS/balance.   Toe Taps: standing toe taps to blaze pod (multi color added for cognition challenge, Red = Righ Foot, Green = Left Foot). Intermittent challenge, requiring cues for use of proper LE. Increased balance challenge with crossover. CGA.    Lateral Step Overs: Completed lateral step over with hurdles with BLE, cues for posture. Completed x 10 reps bilaterally. Patient intermittent compensation, circumduction foot around target. Cues required for proper completion.    PATIENT EDUCATION: Education details: Continue HEP Person educated: Patient and  Spouse Education method: Explanation Education comprehension: verbalized understanding   HOME EXERCISE PROGRAM: Access Code: [email or web address]   GOALS: Goals reviewed with patient? Yes     SHORT TERM GOALS: Target date: 11/26/2022   1.  Pt will be able to hold situation 4 of M-CTSIB for >/= 10 seconds Baseline: 7-8 seconds; 10.3 seconds Goal status: MET    LONG TERM GOALS: Target date: 12/24/2022   Pt will improve gait speed to >/= 1.2 m/sec to demonstrate improved community ambulation Baseline: 1.10 m/s; 1.15 m/s  Goal status: NOT MET   2.  Pt will improve 30 second STS test >/= 13 reps to demo improved functional LE strength and balance  Baseline: 11 reps; 12 reps Goal status: NOT MET   3. Pt will be able to hold situation 4 of M-CTSIB for >/= 15 seconds Baseline: 7-8 seconds; 10.5 seconds  Goal status: NOT MET   4.  Pt will be independent with final HEP for dynamic gait and balance to reduce fall risk.  Baseline: reports IND with HEP (will benefit from progressive HEP Goal  status: MET  5.  Pt will improve DGI by 3 points from baseline to demonstrate improved balance and reduced fall risk Baseline: 15/24; 18/24 Goal status: MET Updated Goals:   SHORT TERM GOALS: Target date: 01/21/2023   1.  Pt will improve 30 second STS test >/= 13 reps to demo improved functional LE strength and balance  Baseline: 11 reps; 12 reps Goal status: INITIAL    LONG TERM GOALS: Target date: 02/18/2023   Pt will improve gait speed to >/= 1.2 m/sec to demonstrate improved community ambulation Baseline: 1.10 m/s; 1.15 m/s  Goal status: ON-GOING   2.  Pt will improve ABC Scale to >/= 75% to demonstrate improved confidence/balance with daily activities Baseline: 69% Goal status: INITIAL   3. Pt will be able to hold situation 4 of M-CTSIB for >/= 15 seconds Baseline: 7-8 seconds; 10.5 seconds  Goal status: ON-GOING   4.  Pt will be independent with final HEP for dynamic gait and balance to reduce  fall risk.  Baseline: reports IND with HEP (will benefit from progressive HEP) Goal status: ON-GOING  5.  Pt will improve DGI to >/= 19/24 to demonstrate improved balance and reduced fall risk Baseline: 15/24; 18/24 Goal status: ON-GOING ASSESSMENT:   CLINICAL IMPRESSION:  Patient presents with good motivation for completion of physical therapy activities.  Physical therapy targeted activities to improve patient's lower extremity strength and endurance and also improve patient's ability to ambulate on incline and decline surfaces as this was a reported concern since the previous physical therapy session.  Patient arrived a few minutes late because he and his caregiver got mixed up on the buildings but are eager to be here on time for the next physical therapy appointment. Pt will continue to benefit from skilled physical therapy intervention to address impairments, improve QOL, and attain therapy goals.    OBJECTIVE IMPAIRMENTS: Abnormal gait, decreased activity tolerance, decreased balance, decreased mobility, difficulty walking, decreased strength, decreased safety awareness, and dizziness.    ACTIVITY LIMITATIONS: bending, sitting, standing, transfers, bed mobility, and reach over head   PARTICIPATION LIMITATIONS: driving, community activity, and yard work   PERSONAL FACTORS: Age, Time since onset of injury/illness/exacerbation, and 3+ comorbidities: Anxiety, Arthritis, CAD, CHF, CKD, COPD, Depression, Hx of CVA, Hypothyroidism, OSA, Paroxysmal A-Fib   are also affecting patient's functional outcome.    REHAB POTENTIAL: Good   CLINICAL DECISION MAKING: Evolving/moderate complexity   EVALUATION COMPLEXITY: Moderate     PLAN:   PT FREQUENCY: 1x/week   PT DURATION: 8 weeks   PLANNED INTERVENTIONS: Therapeutic exercises, Therapeutic activity, Neuromuscular re-education, Balance training, Gait training, Patient/Family education, Self Care, Joint mobilization, Stair training,  Vestibular training, Canalith repositioning, DME instructions, Cryotherapy, Moist heat, Manual therapy, and Re-evaluation   PLAN FOR NEXT SESSION: Dynamic balance activities; Dynamic Gait; Functional strengthening. Rockerboard. Has posterior lean with challenging balance activities.  Work on incline and decline surfaces controlling speed preventing any shuffling gait or uncontrolled gait on downhill/decline   Lonni KATHEE Gainer PT ,DPT Physical Therapist- [facility]  [facility]  01/13/23 11:11 AM

## 2023-01-13 NOTE — Telephone Encounter (Signed)
 Requested Prescriptions  Refused Prescriptions Disp Refills   levothyroxine  (SYNTHROID ) 125 MCG tablet [Pharmacy Med Name: LEVOTHYROXINE  0.125MG  ( ) TAB] 90 tablet 2    Sig: TAKE 1 TABLET(125 MCG) BY MOUTH DAILY BEFORE BREAKFAST     Endocrinology:  Hypothyroid Agents Passed - 01/13/2023  5:14 PM      Passed - TSH in normal range and within 360 days    TSH  Date Value Ref Range Status  12/15/2022 0.92 0.40 - 4.50 mIU/L Final         Passed - Valid encounter within last 12 months    Recent Outpatient Visits           2 weeks ago Essential hypertension   [place] [facility] Delles, Sharyle LABOR, RPH-CPP   3 weeks ago Annual physical exam   [place] [facility] Edman Marsa PARAS, DO   3 months ago Essential hypertension   [place] [facility] Delles, Sharyle LABOR, RPH-CPP   4 months ago Psychophysiologic insomnia   [place] [facility] Delles, Sharyle A, RPH-CPP   5 months ago Dizziness on standing   [place] [facility] Mecum, Erin E, [place]       Future Appointments             In 1 week Franchester, Cadence H, PA-C [employer] [facility] at [place]

## 2023-01-20 ENCOUNTER — Ambulatory Visit: Payer: Medicare PPO | Admitting: Physical Therapy

## 2023-01-20 ENCOUNTER — Other Ambulatory Visit: Payer: Self-pay | Admitting: Family Medicine

## 2023-01-20 DIAGNOSIS — I10 Essential (primary) hypertension: Secondary | ICD-10-CM

## 2023-01-20 DIAGNOSIS — R296 Repeated falls: Secondary | ICD-10-CM | POA: Diagnosis not present

## 2023-01-20 DIAGNOSIS — R42 Dizziness and giddiness: Secondary | ICD-10-CM

## 2023-01-20 DIAGNOSIS — R2681 Unsteadiness on feet: Secondary | ICD-10-CM | POA: Diagnosis not present

## 2023-01-20 DIAGNOSIS — H04123 Dry eye syndrome of bilateral lacrimal glands: Secondary | ICD-10-CM | POA: Diagnosis not present

## 2023-01-20 DIAGNOSIS — R2689 Other abnormalities of gait and mobility: Secondary | ICD-10-CM | POA: Diagnosis not present

## 2023-01-20 DIAGNOSIS — I25810 Atherosclerosis of coronary artery bypass graft(s) without angina pectoris: Secondary | ICD-10-CM

## 2023-01-20 NOTE — Therapy (Signed)
 OUTPATIENT PHYSICAL THERAPY TREATMENT NOTE   Patient Name: Luke Mckee MRN: [id number] DOB:[birth year], 83 y.o., male Today's Date: 01/20/2023  PCP: Edman Marsa PARAS, DO  REFERRING PROVIDER: Marylene Rocky BRAVO, PA-C   END OF SESSION:  PT End of Session - 01/20/23 1012     Visit Number 15    Number of Visits 20    Date for PT Re-Evaluation 02/18/23    Authorization Type [employer]    Progress Note Due on Visit 20    PT Start Time 1015    PT Stop Time 1058    PT Time Calculation (min) 43 min    Equipment Utilized During Treatment Gait belt    Activity Tolerance Patient tolerated treatment well    Behavior During Therapy WFL for tasks assessed/performed              Past Medical History:  Diagnosis Date   Anxiety    Arthritis    CAD (coronary artery disease) 2005   s/p CABG, DES   CHF (congestive heart failure) (HCC)    in EPIC care everywhere   Chronic kidney disease    COPD (chronic obstructive pulmonary disease) (HCC)    in EPIC care everywhere   Depression    Heart murmur    History of stroke    Hypothyroidism    OSA (obstructive sleep apnea)    Paroxysmal A-fib (HCC)    in EPIC careeverywhere   Past Surgical History:  Procedure Laterality Date   APPENDECTOMY     BUNIONECTOMY     CATARACT EXTRACTION     Right eye   CATARACT EXTRACTION W/PHACO Left 01/19/2021   Procedure: CATARACT EXTRACTION PHACO AND INTRAOCULAR LENS PLACEMENT (IOC) LEFT 3.09 00:28.3;  Surgeon: Myrna Adine Anes, MD;  Location: [facility];  Service: Ophthalmology;  Laterality: Left;   COLONOSCOPY  07/06/2004   CORONARY ARTERY BYPASS GRAFT  2005   LOOP RECORDER INSERTION N/A 07/25/2018   Procedure: LOOP RECORDER INSERTION;  Surgeon: Inocencio Soyla Lunger, MD;  Location: [facility];  Service: Cardiovascular;  Laterality: N/A;   TOTAL HIP ARTHROPLASTY Right 11/23/2011   TOTAL HIP ARTHROPLASTY Left 09/07/2011   TOTAL KNEE ARTHROPLASTY Right 02/15/2018   VASECTOMY  1978    Patient Active Problem List   Diagnosis Date Noted   Dizziness on standing 08/16/2022   Impaired fasting glucose 09/24/2021   Depression 09/24/2021   Sensorineural hearing loss, bilateral 09/24/2021   Hypothyroid 09/24/2021   Hyperlipidemia 09/24/2021   Sleep apnea 09/24/2021   Hallux limitus, acquired, right 11/12/2019   Pre-diabetes 10/17/2019   Recurrent major depressive disorder, in partial remission (HCC) 04/16/2019   Obesity (BMI 30.0-34.9) 04/16/2019   Vasomotor rhinitis 10/25/2018   Hearing loss 08/28/2018   Osteoarthritis 08/28/2018   Tinnitus 08/28/2018   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 (HCC) 07/31/2018   Slurred speech 07/23/2018   Right arm weakness 07/23/2018   Hypokalemia 07/23/2018   Anxiety 07/20/2018   Atherosclerosis of native coronary artery of native heart with stable angina pectoris (HCC) 06/16/2018   Hx of CABG 06/16/2018   Mixed hyperlipidemia 06/16/2018   Benign essential HTN 06/16/2018   History of cerebrovascular accident (CVA) with residual deficit 06/07/2018   Systolic CHF (HCC) 02/15/2018   Hypertension 02/15/2018   S/P total knee arthroplasty, right 02/15/2018   Primary osteoarthritis of both knees 11/25/2017   CAD (coronary artery disease) 11/12/2016   Abnormal nuclear stress test 06/30/2016   Hallux limitus of right foot 10/22/2015  Anesthesia complication 10/10/2015   GERD (gastroesophageal reflux disease) 10/10/2015   Hypothyroidism 10/10/2015   Coronary artery disease involving native coronary artery of native heart with angina pectoris (HCC) 10/10/2015   OSA on CPAP 01/10/2007   Diagnosis unknown 01/12/2003    ONSET DATE: Referral Date: 08/13/2022 (Acute on Chronic Dizziness)  REFERRING DIAG: R42 (ICD-10-CM) - Dizziness on standing  THERAPY DIAG:  Dizziness and giddiness  Repeated falls  Unsteadiness on feet  Other abnormalities of gait and mobility  Rationale for Evaluation and Treatment:  Rehabilitation  PERTINENT HISTORY: Anxiety, Arthritis, CAD, CHF, CKD, COPD, Depression, Hx of CVA, Hypothyroidism, OSA, Paroxysmal A-Fib   PRECAUTIONS: None  SUBJECTIVE: Patient reports he feels okay, is apologetic for late arrival, pt arrives in transport chair. Pt reports no falls since last session. Pt caregiver reports she would like him to improve on his decline walking as he had difficulty controlling this recently.   PAIN:  Are you having pain? No  Vitals:   OBJECTIVE: GAIT: Gait pattern: step through pattern Distance walked: into/out of clinic Assistive device utilized: None Level of assistance: Modified independence Comments: mild unsteadiness with increased gait speed noted; cues for pacing.    Treatment  TE Nustel level 3 x 6 min UE and LE reciprocal movements  Ambulation with 4# AW x 320 ft  STS x 10  Ambulation with 4#AW x 320 ft  STS x 10  LAQ x 15 ea LE with 4# AW  Neuro Re-ED Standing Balance:  Rockerboard: Standing with Rockerboard A/P, completed working on rocking minimally a/p while minimizing hip motion 3 x 30 sec   Resisted gait with matrix cable machine and 18.5# resistance on belt x 4 laps ea direction, difficulty with retro to forward transition    On incline board at 20 degree incline   - 30 sec hold  - x 10 lateral head turns   - x 10 vertical head nods  -2 x 10 heel raises with UE support   PATIENT EDUCATION: Education details: Continue HEP Person educated: Patient and Spouse Education method: Explanation Education comprehension: verbalized understanding   HOME EXERCISE PROGRAM: Access Code: [email or web address]   GOALS: Goals reviewed with patient? Yes     SHORT TERM GOALS: Target date: 11/26/2022   1.  Pt will be able to hold situation 4 of M-CTSIB for >/= 10 seconds Baseline: 7-8 seconds; 10.3 seconds Goal status: MET    LONG TERM GOALS: Target date: 12/24/2022   Pt will improve gait speed to >/= 1.2 m/sec to demonstrate improved  community ambulation Baseline: 1.10 m/s; 1.15 m/s  Goal status: NOT MET   2.  Pt will improve 30 second STS test >/= 13 reps to demo improved functional LE strength and balance  Baseline: 11 reps; 12 reps Goal status: NOT MET   3. Pt will be able to hold situation 4 of M-CTSIB for >/= 15 seconds Baseline: 7-8 seconds; 10.5 seconds  Goal status: NOT MET   4.  Pt will be independent with final HEP for dynamic gait and balance to reduce fall risk.  Baseline: reports IND with HEP (will benefit from progressive HEP Goal status: MET  5.  Pt will improve DGI by 3 points from baseline to demonstrate improved balance and reduced fall risk Baseline: 15/24; 18/24 Goal status: MET Updated Goals:   SHORT TERM GOALS: Target date: 01/21/2023   1.  Pt will improve 30 second STS test >/= 13 reps to demo improved functional LE strength and balance  Baseline: 11 reps; 12 reps Goal status: INITIAL    LONG TERM GOALS: Target date: 02/18/2023   Pt will improve gait speed to >/= 1.2 m/sec to demonstrate improved community ambulation Baseline: 1.10 m/s; 1.15 m/s  Goal status: ON-GOING   2.  Pt will improve ABC Scale to >/= 75% to demonstrate improved confidence/balance with daily activities Baseline: 69% Goal status: INITIAL   3. Pt will be able to hold situation 4 of M-CTSIB for >/= 15 seconds Baseline: 7-8 seconds; 10.5 seconds  Goal status: ON-GOING   4.  Pt will be independent with final HEP for dynamic gait and balance to reduce fall risk.  Baseline: reports IND with HEP (will benefit from progressive HEP) Goal status: ON-GOING  5.  Pt will improve DGI to >/= 19/24 to demonstrate improved balance and reduced fall risk Baseline: 15/24; 18/24 Goal status: ON-GOING ASSESSMENT:   CLINICAL IMPRESSION:  Patient presents with good motivation for completion of physical therapy activities.  Physical therapy targeted activities to improve patient's lower extremity strength and endurance and also  improve patient's ability to ambulate on incline and decline surfaces as this was a reported concern since the previous physical therapy session.  Pt progressing well with interventions and responds well to instructions and cues. Pt able to walk down without transport chair compared to riding down last session showing improved confidence with ambulation.  Pt will continue to benefit from skilled physical therapy intervention to address impairments, improve QOL, and attain therapy goals.    OBJECTIVE IMPAIRMENTS: Abnormal gait, decreased activity tolerance, decreased balance, decreased mobility, difficulty walking, decreased strength, decreased safety awareness, and dizziness.    ACTIVITY LIMITATIONS: bending, sitting, standing, transfers, bed mobility, and reach over head   PARTICIPATION LIMITATIONS: driving, community activity, and yard work   PERSONAL FACTORS: Age, Time since onset of injury/illness/exacerbation, and 3+ comorbidities: Anxiety, Arthritis, CAD, CHF, CKD, COPD, Depression, Hx of CVA, Hypothyroidism, OSA, Paroxysmal A-Fib   are also affecting patient's functional outcome.    REHAB POTENTIAL: Good   CLINICAL DECISION MAKING: Evolving/moderate complexity   EVALUATION COMPLEXITY: Moderate     PLAN:   PT FREQUENCY: 1x/week   PT DURATION: 8 weeks   PLANNED INTERVENTIONS: Therapeutic exercises, Therapeutic activity, Neuromuscular re-education, Balance training, Gait training, Patient/Family education, Self Care, Joint mobilization, Stair training, Vestibular training, Canalith repositioning, DME instructions, Cryotherapy, Moist heat, Manual therapy, and Re-evaluation   PLAN FOR NEXT SESSION: Dynamic balance activities; Dynamic Gait; Functional strengthening. Rockerboard. Has posterior lean with challenging balance activities.  Work on incline and decline surfaces controlling speed preventing any shuffling gait or uncontrolled gait on downhill/decline   Lonni KATHEE Gainer PT  ,DPT Physical Therapist- [facility]  [facility]  01/20/23 10:12 AM

## 2023-01-24 ENCOUNTER — Telehealth: Payer: Self-pay

## 2023-01-24 ENCOUNTER — Ambulatory Visit: Payer: Medicare PPO | Attending: Cardiovascular Disease

## 2023-01-24 DIAGNOSIS — I10 Essential (primary) hypertension: Secondary | ICD-10-CM

## 2023-01-24 DIAGNOSIS — R0600 Dyspnea, unspecified: Secondary | ICD-10-CM

## 2023-01-24 LAB — ECHOCARDIOGRAM COMPLETE
AV Mean grad: 3 mm[Hg]
AV Peak grad: 5.1 mm[Hg]
Ao pk vel: 1.13 m/s
Area-P 1/2: 3.6 cm2

## 2023-01-24 MED ORDER — ATENOLOL 25 MG PO TABS: 25.0000 mg | ORAL_TABLET | Freq: Every day | ORAL | 3 refills | Status: DC

## 2023-01-24 NOTE — Telephone Encounter (Signed)
 Yes I confirmed with his wife, it is Atenolol 25mg  daily, I sent rx as requested  Saralyn Pilar, DO [facility] 01/24/2023, 12:00 PM

## 2023-01-24 NOTE — Telephone Encounter (Signed)
 Received Rx request from [facility] for Atenolol 50 mg tablets 1 tab daily. I see historical 25 mg on file for patient. Please advise if okay for refill. Thanks!

## 2023-01-24 NOTE — Addendum Note (Signed)
 Addended by: [name] on: 01/24/2023 12:00 PM   Modules accepted: Orders

## 2023-01-24 NOTE — Telephone Encounter (Signed)
 Requested Prescriptions  Refused Prescriptions Disp Refills   atenolol  (TENORMIN ) 50 MG tablet [Pharmacy Med Name: ATENOLOL  50MG  TABLETS] 90 tablet 0    Sig: TAKE 1 TABLET(50 MG) BY MOUTH DAILY     Cardiovascular: Beta Blockers 2 Passed - 01/24/2023 12:10 PM      Passed - Cr in normal range and within 360 days    Creat  Date Value Ref Range Status  12/15/2022 0.95 0.70 - 1.22 mg/dL Final         Passed - Last BP in normal range    BP Readings from Last 1 Encounters:  12/21/22 110/64         Passed - Last Heart Rate in normal range    Pulse Readings from Last 1 Encounters:  12/21/22 66         Passed - Valid encounter within last 6 months    Recent Outpatient Visits           1 month ago Essential hypertension   [place] [facility] Delles, Sharyle LABOR, RPH-CPP   1 month ago Annual physical exam   [place] [facility] Edman Marsa PARAS, [place]   3 months ago Essential hypertension   [facility] Delles, Sharyle LABOR, RPH-CPP   4 months ago Psychophysiologic insomnia   [place] [facility] Delles, Sharyle A, RPH-CPP   5 months ago Dizziness on standing   [place] [facility] Mecum, Erin E, [place]       Future Appointments             In 2 days Franchester, Cadence H, PA-C [facility] [place]

## 2023-01-25 ENCOUNTER — Encounter: Payer: Self-pay | Admitting: Emergency Medicine

## 2023-01-26 ENCOUNTER — Encounter: Payer: Self-pay | Admitting: Medical

## 2023-01-26 ENCOUNTER — Ambulatory Visit: Payer: Medicare PPO | Attending: Medical | Admitting: Medical

## 2023-01-26 VITALS — BP 112/68 | HR 70 | Ht 68.0 in | Wt 215.0 lb

## 2023-01-26 DIAGNOSIS — I2510 Atherosclerotic heart disease of native coronary artery without angina pectoris: Secondary | ICD-10-CM | POA: Diagnosis not present

## 2023-01-26 DIAGNOSIS — I10 Essential (primary) hypertension: Secondary | ICD-10-CM | POA: Diagnosis not present

## 2023-01-26 DIAGNOSIS — Z951 Presence of aortocoronary bypass graft: Secondary | ICD-10-CM | POA: Diagnosis not present

## 2023-01-26 DIAGNOSIS — R0600 Dyspnea, unspecified: Secondary | ICD-10-CM

## 2023-01-26 DIAGNOSIS — R0602 Shortness of breath: Secondary | ICD-10-CM | POA: Diagnosis not present

## 2023-01-26 DIAGNOSIS — G4733 Obstructive sleep apnea (adult) (pediatric): Secondary | ICD-10-CM | POA: Diagnosis not present

## 2023-01-26 NOTE — Patient Instructions (Addendum)
 Medication Instructions:  Your physician recommends that you continue on your current medications as directed. Please refer to the Current Medication list given to you today. 8 *If you need a refill on your cardiac medications before your next appointment, please call your pharmacy*   Lab Work:  Your provider would like for you to have the following labs drawn: TODAY.  BMET / TSH/ CBC  Please go to [facility] [address] ([facility]) [address], [place] [address] You do not need an appointment.  They are open from 8 am- 4:30 pm.  Lunch from 1:00 pm- 2:00 pm    If you have labs (blood work) drawn today and your tests are completely normal, you will receive your results only by: MyChart Message (if you have MyChart) OR A paper copy in the mail If you have any lab test that is abnormal or we need to change your treatment, we will call you to review the results.   Testing/Procedures:  A chest x-ray takes a picture of the organs and structures inside the chest, including the heart, lungs, and blood vessels. This test can show several things, including, whether the heart is enlarges; whether fluid is building up in the lungs; and whether pacemaker / defibrillator leads are still in place.     Please report to Radiology at the [facility] Main Entrance 30 minutes early for your test.  [address] [place], [place] [address]                         OR   Please report to Radiology at [facility] Main Entrance, medical mall, 30 mins prior to your test.  [address]  [place], [place]  [phone]  How to Prepare for Your Cardiac PET/CT Stress Test:  Nothing to eat or drink, except water, 3 hours prior to arrival time.  NO caffeine/decaffeinated products, or chocolate 12 hours prior to arrival. (Please note decaffeinated beverages (teas/coffees) still contain caffeine).  If you have caffeine within 12 hours prior,  the test will need to be rescheduled.  Medication instructions: Do not take erectile dysfunction medications for 72 hours prior to test (sildenafil, tadalafil) Do not take nitrates (isosorbide  mononitrate, Ranexa) the day before or day of test Do not take tamsulosin the day before or morning of test Hold theophylline containing medications for 12 hours. Hold Dipyridamole  48 hours prior to the test.  Diabetic Preparation: If able to eat breakfast prior to 3 hour fasting, you may take all medications, including your insulin. Do not worry if you miss your breakfast dose of insulin - start at your next meal. If you do not eat prior to 3 hour fast-Hold all diabetes (oral and insulin) medications. Patients who wear a continuous glucose monitor MUST remove the device prior to scanning.  You may take your remaining medications with water.  NO perfume, cologne or lotion on chest or abdomen area. FEMALES - Please avoid wearing dresses to this appointment.  Total time is 1 to 2 hours; you may want to bring reading material for the waiting time.  IF YOU THINK YOU MAY BE PREGNANT, OR ARE NURSING PLEASE INFORM THE TECHNOLOGIST.  In preparation for your appointment, medication and supplies will be purchased.  Appointment availability is limited, so if you need to cancel or reschedule, please call the Radiology Department at [phone] Maryan Smalling) OR [phone] [facility]) 24 hours in advance to avoid a cancellation  fee of $100.00  What to Expect When you Arrive:  Once you arrive and check in for your appointment, you will be taken to a preparation room within the Radiology Department.  A technologist or Nurse will obtain your medical history, verify that you are correctly prepped for the exam, and explain the procedure.  Afterwards, an IV will be started in your arm and electrodes will be placed on your skin for EKG monitoring during the stress portion of the exam. Then you will be escorted to the  PET/CT scanner.  There, staff will get you positioned on the scanner and obtain a blood pressure and EKG.  During the exam, you will continue to be connected to the EKG and blood pressure machines.  A small, safe amount of a radioactive tracer will be injected in your IV to obtain a series of pictures of your heart along with an injection of a stress agent.    After your Exam:  It is recommended that you eat a meal and drink a caffeinated beverage to counter act any effects of the stress agent.  Drink plenty of fluids for the remainder of the day and urinate frequently for the first couple of hours after the exam.  Your doctor will inform you of your test results within 7-10 business days.  For more information and frequently asked questions, please visit our website: [email or web address]  For questions about your test or how to prepare for your test, please call: Cardiac Imaging Nurse Navigators Office: [phone]     Follow-Up: At [facility], you and your health needs are our priority.  As part of our continuing mission to provide you with exceptional heart care, we have created designated Provider Care Teams.  These Care Teams include your primary Cardiologist (physician) and Advanced Practice Providers (APPs -  Physician Assistants and Nurse Practitioners) who all work together to provide you with the care you need, when you need it.  We recommend signing up for the patient portal called "MyChart".  Sign up information is provided on this After Visit Summary.  MyChart is used to connect with patients for Virtual Visits (Telemedicine).  Patients are able to view lab/test results, encounter notes, upcoming appointments, etc.  Non-urgent messages can be sent to your provider as well.   To learn more about what you can do with MyChart, go to [email or web address].    Your next appointment:   1 month(s)  Provider:   You may see Timothy Gollan, MD or one of the  following Advanced Practice Providers on your designated Care Team:   Laneta Pintos, NP Varney Gentleman, PA-C Cadence Gennaro Khat, PA-C Ronald Cockayne, NP Morey Ar, NP

## 2023-01-26 NOTE — Progress Notes (Signed)
 Cardiology Office Note:    Date:  01/26/2023   ID:  Luke Mckee, DOB 29-Jul-1940, MRN [id number]  PCP:  Luke Bunting, DO  [facility] Cardiologist:  Luke Boyden, MD  [facility] Electrophysiologist:  None   Referring MD: Luke Mckee *   Chief Complaint: DOE  History of Present Illness:    Luke Mckee is a 83 y.o. male with a hx of CAD s/p CABG x3 in [place]  2005 with subsequent stent placement, anxiety, hypertension, history of stroke x2, status post loop monitor with no A-fib detected who presents for follow-up.   Patient has a history of CAD with CABG x 3 in 2005. He underwent DES to OM SVG in 2018.  Patient reported another stent placement, however records unclear as to when or where.   Patient has a history of stroke x 2.  He has a loop recorder implanted with no evidence of A-fib so far. The look recorder is no longer transmitting, but still in place.   Patient was seen in the ER 07/29/2022 for pre-syncope and dizziness.  Blood pressure 155/114 pulse 65.  EKG showed normal sinus rhythm.  Labs were reassuring.  CT head and MRI brain were nonacute.  Patient was given meclizine  and fluids and Atenolol  was cut in half.   Patient was seen in follow-up in July 2024 blood pressure was low, orthostatics were negative.  Spironolactone  was stopped. He was last seen in October 2024 and lasix  was decreased for orthostasis.   Today, the patient reports intermittent shortness of breath. Worse with exertion. He denies chest pain. No lower leg edema. He uses CPAP nightly. He is taking lasix  80mg  daily sine October, he says breathing was bad before he decreased this. No recent fever or chills. Reports frequent falls.   Past Medical History:  Diagnosis Date   Anxiety    Arthritis    CAD (coronary artery disease) 2005   s/p CABG, DES   CHF (congestive heart failure) (HCC)    in EPIC care everywhere   Chronic kidney disease    COPD (chronic obstructive pulmonary  disease) (HCC)    in EPIC care everywhere   Depression    Heart murmur    History of stroke    Hypothyroidism    OSA (obstructive sleep apnea)    Paroxysmal A-fib (HCC)    in EPIC careeverywhere    Past Surgical History:  Procedure Laterality Date   APPENDECTOMY     BUNIONECTOMY     CATARACT EXTRACTION     Right eye   CATARACT EXTRACTION W/PHACO Left 01/19/2021   Procedure: CATARACT EXTRACTION PHACO AND INTRAOCULAR LENS PLACEMENT (IOC) LEFT 3.09 00:28.3;  Surgeon: Rosa College, MD;  Location: [facility];  Service: Ophthalmology;  Laterality: Left;   COLONOSCOPY  07/06/2004   CORONARY ARTERY BYPASS GRAFT  2005   LOOP RECORDER INSERTION N/A 07/25/2018   Procedure: LOOP RECORDER INSERTION;  Surgeon: Lei Pump, MD;  Location: [facility];  Service: Cardiovascular;  Laterality: N/A;   TOTAL HIP ARTHROPLASTY Right 11/23/2011   TOTAL HIP ARTHROPLASTY Left 09/07/2011   TOTAL KNEE ARTHROPLASTY Right 02/15/2018   VASECTOMY  1978    Current Medications: Current Meds  Medication Sig   acetaminophen  (TYLENOL ) 650 MG CR tablet Take 1,300 mg by mouth at bedtime as needed.   albuterol  (VENTOLIN  HFA) 108 (90 Base) MCG/ACT inhaler Inhale 1-2 puffs into the lungs every 4 (four) hours as needed for wheezing or shortness of breath (cough).  aspirin  EC 81 MG tablet Take 81 mg by mouth daily.   atenolol  (TENORMIN ) 25 MG tablet Take 1 tablet (25 mg total) by mouth daily.   atorvastatin  (LIPITOR ) 80 MG tablet Take 1 tablet (80 mg total) by mouth daily.   Calcium  Carb-Cholecalciferol (CALCIUM  600 + D PO) Take 1 tablet by mouth daily.   Cholecalciferol (VITAMIN D) 50 MCG (2000 UT) tablet Take 2,000 Units by mouth daily.   clopidogrel  (PLAVIX ) 75 MG tablet Take 1 tablet (75 mg total) by mouth daily.   Coenzyme Q10 100 MG TABS Take 100 mg by mouth daily.   colchicine  0.6 MG tablet For acute gout flare take 2 pills at once, then take a 3rd pill 2 hours later. Then take 1  pill daily for 7-10 days or until resolved. (Patient taking differently: Take 0.6 mg by mouth daily as needed. For acute gout flare take 2 pills at once, then take a 3rd pill 2 hours later. Then take 1 pill daily for 7-10 days or until resolved.)   diclofenac sodium (VOLTAREN) 1 % GEL Apply 2 g topically 4 (four) times daily as needed for pain. Foot pain   escitalopram  (LEXAPRO ) 20 MG tablet Take 1 tablet (20 mg total) by mouth daily.   furosemide  (LASIX ) 80 MG tablet TAKE 1 TABLET(80 MG) BY MOUTH TWICE DAILY (Patient taking differently: Take 80 mg by mouth daily.)   ipratropium (ATROVENT ) 0.06 % nasal spray Place 2 sprays into both nostrils 4 (four) times daily as needed for rhinitis.   levothyroxine  (SYNTHROID ) 125 MCG tablet TAKE 1 TABLET(125 MCG) BY MOUTH DAILY BEFORE BREAKFAST   loratadine  (CLARITIN ) 10 MG tablet Take 10 mg by mouth daily as needed.   LORazepam  (ATIVAN ) 0.5 MG tablet Take 1 tablet (0.5 mg total) by mouth daily as needed for anxiety or sleep.   meclizine  (ANTIVERT ) 12.5 MG tablet Take 1 tablet (12.5 mg total) by mouth 3 (three) times daily as needed for dizziness.   montelukast  (SINGULAIR ) 10 MG tablet Take 1 tablet (10 mg total) by mouth at bedtime.   Multiple Vitamin (MULTIVITAMIN) tablet Take 1 tablet by mouth daily.   nitroGLYCERIN  (NITROSTAT ) 0.4 MG SL tablet Place 1 tablet (0.4 mg total) under the tongue every 5 (five) minutes as needed for chest pain.   PATADAY 0.1 % ophthalmic solution Place into both eyes as needed.    sennosides-docusate sodium  (SENOKOT-S) 8.6-50 MG tablet Take 1-2 tablets by mouth daily as needed for constipation.   traZODone  (DESYREL ) 50 MG tablet Take 1-2 tablets (50-100 mg total) by mouth at bedtime. (Patient taking differently: Take 50 mg by mouth at bedtime.)     Allergies:   Ace inhibitors, Cephalexin, Crestor [rosuvastatin calcium ], and Tape   Social History   Socioeconomic History   Marital status: Married    Spouse name: Not on file    Number of children: Not on file   Years of education: Not on file   Highest education level: Not on file  Occupational History   Occupation: retired  Tobacco Use   Smoking status: Never   Smokeless tobacco: Never  Vaping Use   Vaping status: Never Used  Substance and Sexual Activity   Alcohol use: Not Currently    Comment: past   Drug use: Never   Sexual activity: Not on file  Other Topics Concern   Not on file  Social History Narrative   Not on file   Social Drivers of Health   Financial Resource Strain: Low Risk  (  01/07/2023)   Received from [facility]   Overall Financial Resource Strain (CARDIA)    Difficulty of Paying Living Expenses: Not very hard  Food Insecurity: No Food Insecurity (01/07/2023)   Received from [facility]   Hunger Vital Sign    Worried About Running Out of Food in the Last Year: Never true    Ran Out of Food in the Last Year: Never true  Transportation Needs: No Transportation Needs (01/07/2023)   Received from [facility] - Transportation    In the past 12 months, has lack of transportation kept you from medical appointments or from getting medications?: No    Lack of Transportation (Non-Medical): No  Physical Activity: Sufficiently Active (10/22/2022)   Exercise Vital Sign    Days of Exercise per Week: 6 days    Minutes of Exercise per Session: 30 min  Stress: No Stress Concern Present (10/22/2022)   [employer] of Occupational Health - Occupational Stress Questionnaire    Feeling of Stress : Not at all  Social Connections: Moderately Integrated (10/22/2022)   Social Connection and Isolation Panel [NHANES]    Frequency of Communication with Friends and Family: Three times a week    Frequency of Social Gatherings with Friends and Family: More than three times a week    Attends Religious Services: More than 4 times per year    Active Member of [employer] or Organizations: No     Attends Banker Meetings: Never    Marital Status: Married     Family History: The patient's family history includes Anxiety disorder in his sister.  ROS:   Please see the history of present illness.     All other systems reviewed and are negative.  EKGs/Labs/Other Studies Reviewed:    The following studies were reviewed today:  Echo 01/2023 1. Left ventricular ejection fraction, by estimation, is 55 to 60%. The  left ventricle has normal function. The left ventricle has no regional  wall motion abnormalities. Left ventricular diastolic parameters are  consistent with Grade I diastolic  dysfunction (impaired relaxation).   2. Right ventricular systolic function is normal. The right ventricular  size is normal.   3. The mitral valve is normal in structure. Mild mitral valve  regurgitation.   4. The aortic valve is tricuspid. Aortic valve regurgitation is not  visualized.   5. The inferior vena cava is normal in size with greater than 50%  respiratory variability, suggesting right atrial pressure of 3 mmHg.   EKG:  EKG is ordered today.  The ekg ordered today demonstrates NSR 70bpm, nonspecific T wave changes  Recent Labs: 07/29/2022: B Natriuretic Peptide 71.6 12/15/2022: ALT 14; BUN 16; Creat 0.95; Hemoglobin 13.1; Platelets 225; Potassium 3.8; Sodium 141; TSH 0.92  Recent Lipid Panel    Component Value Date/Time   CHOL 104 12/15/2022 1024   TRIG 74 12/15/2022 1024   HDL 39 (L) 12/15/2022 1024   CHOLHDL 2.7 12/15/2022 1024   VLDL 25 07/24/2018 0458   LDLCALC 50 12/15/2022 1024    Physical Exam:    VS:  BP 112/68 (BP Location: Left Arm, Patient Position: Sitting, Cuff Size: Large)   Pulse 70   Ht 5\' 8"  (1.727 m)   Wt 215 lb (97.5 kg)   SpO2 96%   BMI 32.69 kg/m     Wt Readings from Last 3 Encounters:  01/26/23 215 lb (97.5 kg)  12/21/22 214 lb (97.1 kg)  11/08/22  212 lb 4 oz (96.3 kg)     GEN:  Well nourished, well developed in no acute  distress HEENT: Normal NECK: No JVD; No carotid bruits LYMPHATICS: No lymphadenopathy CARDIAC: RRR, no murmurs, rubs, gallops RESPIRATORY:  crackles at the bases  ABDOMEN: Soft, non-tender, non-distended MUSCULOSKELETAL:  No edema; No deformity  SKIN: Warm and dry NEUROLOGIC:  Alert and oriented x 3 PSYCHIATRIC:  Normal affect   ASSESSMENT:    1. Dyspnea, unspecified type   2. Coronary artery disease involving native coronary artery of native heart without angina pectoris   3. Hx of CABG   4. Essential hypertension    PLAN:    In order of problems listed above:  DOE Patient reports chronic DOE, feels it has been a little worse. He had a concern valves were bad. Echo showed LVEF 55-60%, G1DD, mild MR. In October patient reports Lasix  was decreased and he has been taking 80 mg daily.  He denies lower leg edema.  No chest pain.  I will order a stress PET exam, chest x-ray, BNP, BMET, TSH, CBC.  Patient reports compliance with CPAP. He may need more lasix , but this worsens orthostatic symptoms.   CAD s/p CABG No chest pain but he has chronic DOE. Plan for PET stress test as above. Continue ASA, Plavix , Lipitor  80mg  dialy, atenolol  25mg  daily, and SL NTG.  HTN BP is normal today. He has history of orthostasis. He takes lasix  80mg  daily.   Disposition: Follow up in 1 month(s) with MD/APP   Shared Decision Making/Informed Consent   Informed Consent   Shared Decision Making/Informed Consent The risks [chest pain, shortness of breath, cardiac arrhythmias, dizziness, blood pressure fluctuations, myocardial infarction, stroke/transient ischemic attack, nausea, vomiting, allergic reaction, radiation exposure, metallic taste sensation and life-threatening complications (estimated to be 1 in 10,000)], benefits (risk stratification, diagnosing coronary artery disease, treatment guidance) and alternatives of a cardiac PET stress test were discussed in detail with Mr. Barrett and he agrees to  proceed.       Signed, Decklyn Hyder Rebekah Canada, PA-C  01/26/2023 2:02 PM    [facility]

## 2023-01-27 ENCOUNTER — Telehealth: Payer: Self-pay | Admitting: Physical Therapy

## 2023-01-27 ENCOUNTER — Ambulatory Visit: Payer: Medicare PPO | Admitting: Physical Therapy

## 2023-01-27 LAB — BASIC METABOLIC PANEL
BUN/Creatinine Ratio: 16 (ref 10–24)
BUN: 15 mg/dL (ref 8–27)
CO2: 24 mmol/L (ref 20–29)
Calcium: 9.4 mg/dL (ref 8.6–10.2)
Chloride: 100 mmol/L (ref 96–106)
Creatinine, Ser: 0.95 mg/dL (ref 0.76–1.27)
Glucose: 99 mg/dL (ref 70–99)
Potassium: 5 mmol/L (ref 3.5–5.2)
Sodium: 141 mmol/L (ref 134–144)
eGFR: 80 mL/min/{1.73_m2} (ref >59)

## 2023-01-27 LAB — CBC
Hematocrit: 41.3 % (ref 37.5–51.0)
Hemoglobin: 13.3 g/dL (ref 13.0–17.7)
MCH: 31.1 pg (ref 26.6–33.0)
MCHC: 32.2 g/dL (ref 31.5–35.7)
MCV: 97 fL (ref 79–97)
Platelets: 243 10*3/uL (ref 150–450)
RBC: 4.28 x10E6/uL (ref 4.14–5.80)
RDW: 13 % (ref 11.6–15.4)
WBC: 8.4 10*3/uL (ref 3.4–10.8)

## 2023-01-27 LAB — TSH: TSH: 1.04 u[IU]/mL (ref 0.450–4.500)

## 2023-01-27 NOTE — Telephone Encounter (Signed)
Pt contacted via telephone and author left voice mail informing of missed appointment and informed pt of future PT appointment date and time.   Christopher Byrd PT, DPT   

## 2023-01-27 NOTE — Therapy (Deleted)
OUTPATIENT PHYSICAL THERAPY TREATMENT NOTE   Patient Name: Luke Mckee MRN: [id number] DOB:Jun 14, 1940, 83 y.o., male Today's Date: 01/27/2023  PCP: [name], DO  REFERRING PROVIDER: Mecum, Oswaldo Conroy, PA-C   END OF SESSION:     Past Medical History:  Diagnosis Date   Anxiety    Arthritis    CAD (coronary artery disease) 2005   s/p CABG, DES   CHF (congestive heart failure) (HCC)    in EPIC care everywhere   Chronic kidney disease    COPD (chronic obstructive pulmonary disease) (HCC)    in EPIC care everywhere   Depression    Heart murmur    History of stroke    Hypothyroidism    OSA (obstructive sleep apnea)    Paroxysmal A-fib (HCC)    in EPIC careeverywhere   Past Surgical History:  Procedure Laterality Date   APPENDECTOMY     BUNIONECTOMY     CATARACT EXTRACTION     Right eye   CATARACT EXTRACTION W/PHACO Left 01/19/2021   Procedure: CATARACT EXTRACTION PHACO AND INTRAOCULAR LENS PLACEMENT (IOC) LEFT 3.09 00:28.3;  Surgeon: Nevada Crane, MD;  Location: [facility];  Service: Ophthalmology;  Laterality: Left;   COLONOSCOPY  07/06/2004   CORONARY ARTERY BYPASS GRAFT  2005   LOOP RECORDER INSERTION N/A 07/25/2018   Procedure: LOOP RECORDER INSERTION;  Surgeon: Regan Lemming, MD;  Location: [facility];  Service: Cardiovascular;  Laterality: N/A;   TOTAL HIP ARTHROPLASTY Right 11/23/2011   TOTAL HIP ARTHROPLASTY Left 09/07/2011   TOTAL KNEE ARTHROPLASTY Right 02/15/2018   VASECTOMY  1978   Patient Active Problem List   Diagnosis Date Noted   Dizziness on standing 08/16/2022   Impaired fasting glucose 09/24/2021   Depression 09/24/2021   Sensorineural hearing loss, bilateral 09/24/2021   Hypothyroid 09/24/2021   Hyperlipidemia 09/24/2021   Sleep apnea 09/24/2021   Hallux limitus, acquired, right 11/12/2019   Pre-diabetes 10/17/2019   Recurrent major depressive disorder, in partial remission (HCC) 04/16/2019   Obesity  (BMI 30.0-34.9) 04/16/2019   Vasomotor rhinitis 10/25/2018   Hearing loss 08/28/2018   Osteoarthritis 08/28/2018   Tinnitus 08/28/2018   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 (HCC) 07/31/2018   Slurred speech 07/23/2018   Right arm weakness 07/23/2018   Hypokalemia 07/23/2018   Anxiety 07/20/2018   Atherosclerosis of native coronary artery of native heart with stable angina pectoris (HCC) 06/16/2018   Hx of CABG 06/16/2018   Mixed hyperlipidemia 06/16/2018   Benign essential HTN 06/16/2018   History of cerebrovascular accident (CVA) with residual deficit 06/07/2018   Systolic CHF (HCC) 02/15/2018   Hypertension 02/15/2018   S/P total knee arthroplasty, right 02/15/2018   Primary osteoarthritis of both knees 11/25/2017   CAD (coronary artery disease) 11/12/2016   Abnormal nuclear stress test 06/30/2016   Hallux limitus of right foot 10/22/2015   Anesthesia complication 10/10/2015   GERD (gastroesophageal reflux disease) 10/10/2015   Hypothyroidism 10/10/2015   Coronary artery disease involving native coronary artery of native heart with angina pectoris (HCC) 10/10/2015   OSA on CPAP 01/10/2007   Diagnosis unknown 01/12/2003    ONSET DATE: Referral Date: 08/13/2022 (Acute on Chronic Dizziness)  REFERRING DIAG: R42 (ICD-10-CM) - Dizziness on standing  THERAPY DIAG:  Repeated falls  Unsteadiness on feet  Other abnormalities of gait and mobility  Rationale for Evaluation and Treatment: Rehabilitation  PERTINENT HISTORY: Anxiety, Arthritis, CAD, CHF, CKD, COPD, Depression, Hx of CVA, Hypothyroidism, OSA, Paroxysmal  A-Fib   PRECAUTIONS: None  SUBJECTIVE: Patient reports he feels okay, is apologetic for late arrival, pt arrives in transport chair. Pt reports no falls since last session. Pt caregiver reports she would like him to improve on his decline walking as he had difficulty controlling this recently.   PAIN:  Are you having pain? No  Vitals:    OBJECTIVE: GAIT: Gait pattern: step through pattern Distance walked: into/out of clinic Assistive device utilized: None Level of assistance: Modified independence Comments: mild unsteadiness with increased gait speed noted; cues for pacing.    Treatment  TE [place]el level 3 x 6 min UE and LE reciprocal movements  Ambulation with 4# AW x 320 ft  STS x 10  Ambulation with 4#AW x 320 ft  STS x 10  LAQ x 15 ea LE with 4# AW  Neuro Re-ED Standing Balance:  Rockerboard: Standing with Rockerboard A/P, completed working on rocking minimally a/p while minimizing hip motion 3 x 30 sec   Resisted gait with matrix cable machine and 18.5# resistance on belt x 4 laps ea direction, difficulty with retro to forward transition    On incline board at 20 degree incline   - 30 sec hold  - x 10 lateral head turns   - x 10 vertical head nods  -2 x 10 heel raises with UE support   PATIENT EDUCATION: Education details: Continue HEP Person educated: Patient and Spouse Education method: Explanation Education comprehension: verbalized understanding   HOME EXERCISE PROGRAM: Access Code: [email or web address]   GOALS: Goals reviewed with patient? Yes     SHORT TERM GOALS: Target date: 11/26/2022   1.  Pt will be able to hold situation 4 of M-CTSIB for >/= 10 seconds Baseline: 7-8 seconds; 10.3 seconds Goal status: MET    LONG TERM GOALS: Target date: 12/24/2022   Pt will improve gait speed to >/= 1.2 m/sec to demonstrate improved community ambulation Baseline: 1.10 m/s; 1.15 m/s  Goal status: NOT MET   2.  Pt will improve 30 second STS test >/= 13 reps to demo improved functional LE strength and balance  Baseline: 11 reps; 12 reps Goal status: NOT MET   3. Pt will be able to hold situation 4 of M-CTSIB for >/= 15 seconds Baseline: 7-8 seconds; 10.5 seconds  Goal status: NOT MET   4.  Pt will be independent with final HEP for dynamic gait and balance to reduce fall risk.  Baseline: reports  IND with HEP (will benefit from progressive HEP Goal status: MET  5.  Pt will improve DGI by 3 points from baseline to demonstrate improved balance and reduced fall risk Baseline: 15/24; 18/24 Goal status: MET Updated Goals:   SHORT TERM GOALS: Target date: 01/21/2023   1.  Pt will improve 30 second STS test >/= 13 reps to demo improved functional LE strength and balance  Baseline: 11 reps; 12 reps Goal status: INITIAL    LONG TERM GOALS: Target date: 02/18/2023   Pt will improve gait speed to >/= 1.2 m/sec to demonstrate improved community ambulation Baseline: 1.10 m/s; 1.15 m/s  Goal status: ON-GOING   2.  Pt will improve ABC Scale to >/= 75% to demonstrate improved confidence/balance with daily activities Baseline: 69% Goal status: INITIAL   3. Pt will be able to hold situation 4 of M-CTSIB for >/= 15 seconds Baseline: 7-8 seconds; 10.5 seconds  Goal status: ON-GOING   4.  Pt will be independent with final HEP for dynamic gait and  balance to reduce fall risk.  Baseline: reports IND with HEP (will benefit from progressive HEP) Goal status: ON-GOING  5.  Pt will improve DGI to >/= 19/24 to demonstrate improved balance and reduced fall risk Baseline: 15/24; 18/24 Goal status: ON-GOING ASSESSMENT:   CLINICAL IMPRESSION:  Patient presents with good motivation for completion of physical therapy activities.  Physical therapy targeted activities to improve patient's lower extremity strength and endurance and also improve patient's ability to ambulate on incline and decline surfaces as this was a reported concern since the previous physical therapy session.  Pt progressing well with interventions and responds well to instructions and cues. Pt able to walk down without transport chair compared to riding down last session showing improved confidence with ambulation.  Pt will continue to benefit from skilled physical therapy intervention to address impairments, improve QOL, and attain  therapy goals.    OBJECTIVE IMPAIRMENTS: Abnormal gait, decreased activity tolerance, decreased balance, decreased mobility, difficulty walking, decreased strength, decreased safety awareness, and dizziness.    ACTIVITY LIMITATIONS: bending, sitting, standing, transfers, bed mobility, and reach over head   PARTICIPATION LIMITATIONS: driving, community activity, and yard work   PERSONAL FACTORS: Age, Time since onset of injury/illness/exacerbation, and 3+ comorbidities: Anxiety, Arthritis, CAD, CHF, CKD, COPD, Depression, Hx of CVA, Hypothyroidism, OSA, Paroxysmal A-Fib   are also affecting patient's functional outcome.    REHAB POTENTIAL: Good   CLINICAL DECISION MAKING: Evolving/moderate complexity   EVALUATION COMPLEXITY: Moderate     PLAN:   PT FREQUENCY: 1x/week   PT DURATION: 8 weeks   PLANNED INTERVENTIONS: Therapeutic exercises, Therapeutic activity, Neuromuscular re-education, Balance training, Gait training, Patient/Family education, Self Care, Joint mobilization, Stair training, Vestibular training, Canalith repositioning, DME instructions, Cryotherapy, Moist heat, Manual therapy, and Re-evaluation   PLAN FOR NEXT SESSION: Dynamic balance activities; Dynamic Gait; Functional strengthening. Rockerboard. Has posterior lean with challenging balance activities.  Work on incline and decline surfaces controlling speed preventing any shuffling gait or uncontrolled gait on downhill/decline   Norman Herrlich PT ,DPT Physical Therapist- [facility]  01/27/23 9:26 AM

## 2023-02-03 ENCOUNTER — Ambulatory Visit: Payer: Medicare PPO | Admitting: Physical Therapy

## 2023-02-03 DIAGNOSIS — R2681 Unsteadiness on feet: Secondary | ICD-10-CM

## 2023-02-03 DIAGNOSIS — R296 Repeated falls: Secondary | ICD-10-CM

## 2023-02-03 DIAGNOSIS — R42 Dizziness and giddiness: Secondary | ICD-10-CM | POA: Diagnosis not present

## 2023-02-03 DIAGNOSIS — R2689 Other abnormalities of gait and mobility: Secondary | ICD-10-CM | POA: Diagnosis not present

## 2023-02-03 NOTE — Therapy (Signed)
OUTPATIENT PHYSICAL THERAPY TREATMENT NOTE   Patient Name: Luke Mckee MRN: [id number] DOB:May 16, 1940, 83 y.o., male Today's Date: 02/03/2023  PCP: [name], DO  REFERRING PROVIDER: Providence Crosby, PA-C   END OF SESSION:  PT End of Session - 02/03/23 1106     Visit Number 16    Number of Visits 20    Date for PT Re-Evaluation 02/18/23    Authorization Type [employer]    Progress Note Due on Visit 20    PT Start Time 1103    PT Stop Time 1144    PT Time Calculation (min) 41 min    Equipment Utilized During Treatment Gait belt    Activity Tolerance Patient tolerated treatment well    Behavior During Therapy WFL for tasks assessed/performed               Past Medical History:  Diagnosis Date   Anxiety    Arthritis    CAD (coronary artery disease) 2005   s/p CABG, DES   CHF (congestive heart failure) (HCC)    in EPIC care everywhere   Chronic kidney disease    COPD (chronic obstructive pulmonary disease) (HCC)    in EPIC care everywhere   Depression    Heart murmur    History of stroke    Hypothyroidism    OSA (obstructive sleep apnea)    Paroxysmal A-fib (HCC)    in EPIC careeverywhere   Past Surgical History:  Procedure Laterality Date   APPENDECTOMY     BUNIONECTOMY     CATARACT EXTRACTION     Right eye   CATARACT EXTRACTION W/PHACO Left 01/19/2021   Procedure: CATARACT EXTRACTION PHACO AND INTRAOCULAR LENS PLACEMENT (IOC) LEFT 3.09 00:28.3;  Surgeon: Nevada Crane, MD;  Location: [facility];  Service: Ophthalmology;  Laterality: Left;   COLONOSCOPY  07/06/2004   CORONARY ARTERY BYPASS GRAFT  2005   LOOP RECORDER INSERTION N/A 07/25/2018   Procedure: LOOP RECORDER INSERTION;  Surgeon: Regan Lemming, MD;  Location: [facility];  Service: Cardiovascular;  Laterality: N/A;   TOTAL HIP ARTHROPLASTY Right 11/23/2011   TOTAL HIP ARTHROPLASTY Left 09/07/2011   TOTAL KNEE ARTHROPLASTY Right 02/15/2018   VASECTOMY   1978   Patient Active Problem List   Diagnosis Date Noted   Dizziness on standing 08/16/2022   Impaired fasting glucose 09/24/2021   Depression 09/24/2021   Sensorineural hearing loss, bilateral 09/24/2021   Hypothyroid 09/24/2021   Hyperlipidemia 09/24/2021   Sleep apnea 09/24/2021   Hallux limitus, acquired, right 11/12/2019   Pre-diabetes 10/17/2019   Recurrent major depressive disorder, in partial remission (HCC) 04/16/2019   Obesity (BMI 30.0-34.9) 04/16/2019   Vasomotor rhinitis 10/25/2018   Hearing loss 08/28/2018   Osteoarthritis 08/28/2018   Tinnitus 08/28/2018   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 (HCC) 07/31/2018   Slurred speech 07/23/2018   Right arm weakness 07/23/2018   Hypokalemia 07/23/2018   Anxiety 07/20/2018   Atherosclerosis of native coronary artery of native heart with stable angina pectoris (HCC) 06/16/2018   Hx of CABG 06/16/2018   Mixed hyperlipidemia 06/16/2018   Benign essential HTN 06/16/2018   History of cerebrovascular accident (CVA) with residual deficit 06/07/2018   Systolic CHF (HCC) 02/15/2018   Hypertension 02/15/2018   S/P total knee arthroplasty, right 02/15/2018   Primary osteoarthritis of both knees 11/25/2017   CAD (coronary artery disease) 11/12/2016   Abnormal nuclear stress test 06/30/2016   Hallux limitus of right foot  10/22/2015   Anesthesia complication 10/10/2015   GERD (gastroesophageal reflux disease) 10/10/2015   Hypothyroidism 10/10/2015   Coronary artery disease involving native coronary artery of native heart with angina pectoris (HCC) 10/10/2015   OSA on CPAP 01/10/2007   Diagnosis unknown 01/12/2003    ONSET DATE: Referral Date: 08/13/2022 (Acute on Chronic Dizziness)  REFERRING DIAG: R42 (ICD-10-CM) - Dizziness on standing  THERAPY DIAG:  Dizziness and giddiness  Repeated falls  Unsteadiness on feet  Other abnormalities of gait and mobility  Rationale for Evaluation and  Treatment: Rehabilitation  PERTINENT HISTORY: Anxiety, Arthritis, CAD, CHF, CKD, COPD, Depression, Hx of CVA, Hypothyroidism, OSA, Paroxysmal A-Fib   PRECAUTIONS: None  SUBJECTIVE: Patient reports he feels okay, is apologetic for late arrival, pt arrives in transport chair. Pt reports no falls since last session. Pt caregiver reports she would like him to improve on his decline walking as he had difficulty controlling this recently.   PAIN:  Are you having pain? No  Vitals:   OBJECTIVE: GAIT: Gait pattern: step through pattern Distance walked: into/out of clinic Assistive device utilized: None Level of assistance: Modified independence Comments: mild unsteadiness with increased gait speed noted; cues for pacing.    Treatment  TE  Ambulation with 3# AW x 320 ft  STS x 10  Ambulation with 3#AW x 320 ft  STS x 10  Ambulation with 3#AW x 320 ft  STS x 10  Side step up x 10 ea LE with UE support, cues for adequate performance   Neuro Re-ED Standing Balance:  Resisted gait with matrix cable machine and 17.5# resistance on belt x 3 laps ea direction, difficulty with retro to forward transition    On incline board at 20 degree incline   - 30 sec hold  -2 x 10 heel raises with UE support   - x 15 vertical head nods -2 x 15 sec with eyes closed    Airex stance no UE support in adducted stance - x 10 head turns lateral and x 10 head nods  - 2 x 15 sec eyes closed   Unless otherwise stated, CGA was provided and gait belt donned in order to ensure pt safety   PATIENT EDUCATION: Education details: Continue HEP Person educated: Patient and Spouse Education method: Explanation Education comprehension: verbalized understanding   HOME EXERCISE PROGRAM: Access Code: [email or web address]   GOALS: Goals reviewed with patient? Yes     SHORT TERM GOALS: Target date: 11/26/2022   1.  Pt will be able to hold situation 4 of M-CTSIB for >/= 10 seconds Baseline: 7-8 seconds; 10.3  seconds Goal status: MET    LONG TERM GOALS: Target date: 12/24/2022   Pt will improve gait speed to >/= 1.2 m/sec to demonstrate improved community ambulation Baseline: 1.10 m/s; 1.15 m/s  Goal status: NOT MET   2.  Pt will improve 30 second STS test >/= 13 reps to demo improved functional LE strength and balance  Baseline: 11 reps; 12 reps Goal status: NOT MET   3. Pt will be able to hold situation 4 of M-CTSIB for >/= 15 seconds Baseline: 7-8 seconds; 10.5 seconds  Goal status: NOT MET   4.  Pt will be independent with final HEP for dynamic gait and balance to reduce fall risk.  Baseline: reports IND with HEP (will benefit from progressive HEP Goal status: MET  5.  Pt will improve DGI by 3 points from baseline to demonstrate improved balance and reduced fall risk Baseline: 15/24;  18/24 Goal status: MET Updated Goals:   SHORT TERM GOALS: Target date: 01/21/2023   1.  Pt will improve 30 second STS test >/= 13 reps to demo improved functional LE strength and balance  Baseline: 11 reps; 12 reps Goal status: INITIAL    LONG TERM GOALS: Target date: 02/18/2023   Pt will improve gait speed to >/= 1.2 m/sec to demonstrate improved community ambulation Baseline: 1.10 m/s; 1.15 m/s  Goal status: ON-GOING   2.  Pt will improve ABC Scale to >/= 75% to demonstrate improved confidence/balance with daily activities Baseline: 69% Goal status: INITIAL   3. Pt will be able to hold situation 4 of M-CTSIB for >/= 15 seconds Baseline: 7-8 seconds; 10.5 seconds  Goal status: ON-GOING   4.  Pt will be independent with final HEP for dynamic gait and balance to reduce fall risk.  Baseline: reports IND with HEP (will benefit from progressive HEP) Goal status: ON-GOING  5.  Pt will improve DGI to >/= 19/24 to demonstrate improved balance and reduced fall risk Baseline: 15/24; 18/24 Goal status: ON-GOING ASSESSMENT:   CLINICAL IMPRESSION:  Patient presents with good motivation for  completion of physical therapy activities.  Physical therapy targeted activities to improve patient's lower extremity strength and endurance and also improve patient's ability to ambulate on incline and decline surfaces as this was a reported concern since the previous physical therapy session.  Pt showing and reporting good progress with his balance and mobility at times.  Pt will continue to benefit from skilled physical therapy intervention to address impairments, improve QOL, and attain therapy goals.    OBJECTIVE IMPAIRMENTS: Abnormal gait, decreased activity tolerance, decreased balance, decreased mobility, difficulty walking, decreased strength, decreased safety awareness, and dizziness.    ACTIVITY LIMITATIONS: bending, sitting, standing, transfers, bed mobility, and reach over head   PARTICIPATION LIMITATIONS: driving, community activity, and yard work   PERSONAL FACTORS: Age, Time since onset of injury/illness/exacerbation, and 3+ comorbidities: Anxiety, Arthritis, CAD, CHF, CKD, COPD, Depression, Hx of CVA, Hypothyroidism, OSA, Paroxysmal A-Fib   are also affecting patient's functional outcome.    REHAB POTENTIAL: Good   CLINICAL DECISION MAKING: Evolving/moderate complexity   EVALUATION COMPLEXITY: Moderate     PLAN:   PT FREQUENCY: 1x/week   PT DURATION: 8 weeks   PLANNED INTERVENTIONS: Therapeutic exercises, Therapeutic activity, Neuromuscular re-education, Balance training, Gait training, Patient/Family education, Self Care, Joint mobilization, Stair training, Vestibular training, Canalith repositioning, DME instructions, Cryotherapy, Moist heat, Manual therapy, and Re-evaluation   PLAN FOR NEXT SESSION: Dynamic balance activities; Dynamic Gait; Functional strengthening. Rockerboard. Has posterior lean with challenging balance activities.  Work on incline and decline surfaces controlling speed preventing any shuffling gait or uncontrolled gait on  downhill/decline   Norman Herrlich PT ,DPT Physical Therapist- [facility]  [facility]  02/03/23 11:06 AM

## 2023-02-04 ENCOUNTER — Other Ambulatory Visit: Payer: Medicare PPO

## 2023-02-10 ENCOUNTER — Encounter: Payer: Self-pay | Admitting: Physical Therapy

## 2023-02-10 ENCOUNTER — Ambulatory Visit: Payer: Medicare PPO | Admitting: Physical Therapy

## 2023-02-10 DIAGNOSIS — R296 Repeated falls: Secondary | ICD-10-CM

## 2023-02-10 DIAGNOSIS — R2689 Other abnormalities of gait and mobility: Secondary | ICD-10-CM

## 2023-02-10 DIAGNOSIS — R2681 Unsteadiness on feet: Secondary | ICD-10-CM | POA: Diagnosis not present

## 2023-02-10 DIAGNOSIS — R42 Dizziness and giddiness: Secondary | ICD-10-CM | POA: Diagnosis not present

## 2023-02-10 NOTE — Therapy (Signed)
OUTPATIENT PHYSICAL THERAPY TREATMENT NOTE   Patient Name: Luke Mckee MRN: [id number] DOB:Mar 16, 1940, 83 y.o., male Today's Date: 02/10/2023  PCP: [name], DO  REFERRING PROVIDER: Providence Crosby, PA-C   END OF SESSION:  PT End of Session - 02/10/23 1028     Visit Number 17    Number of Visits 20    Date for PT Re-Evaluation 02/18/23    Authorization Type [employer]    Progress Note Due on Visit 20    PT Start Time 1015    PT Stop Time 1057    PT Time Calculation (min) 42 min    Equipment Utilized During Treatment Gait belt    Activity Tolerance Patient tolerated treatment well    Behavior During Therapy WFL for tasks assessed/performed                Past Medical History:  Diagnosis Date   Anxiety    Arthritis    CAD (coronary artery disease) 2005   s/p CABG, DES   CHF (congestive heart failure) (HCC)    in EPIC care everywhere   Chronic kidney disease    COPD (chronic obstructive pulmonary disease) (HCC)    in EPIC care everywhere   Depression    Heart murmur    History of stroke    Hypothyroidism    OSA (obstructive sleep apnea)    Paroxysmal A-fib (HCC)    in EPIC careeverywhere   Past Surgical History:  Procedure Laterality Date   APPENDECTOMY     BUNIONECTOMY     CATARACT EXTRACTION     Right eye   CATARACT EXTRACTION W/PHACO Left 01/19/2021   Procedure: CATARACT EXTRACTION PHACO AND INTRAOCULAR LENS PLACEMENT (IOC) LEFT 3.09 00:28.3;  Surgeon: Nevada Crane, MD;  Location: [facility];  Service: Ophthalmology;  Laterality: Left;   COLONOSCOPY  07/06/2004   CORONARY ARTERY BYPASS GRAFT  2005   LOOP RECORDER INSERTION N/A 07/25/2018   Procedure: LOOP RECORDER INSERTION;  Surgeon: Regan Lemming, MD;  Location: [facility];  Service: Cardiovascular;  Laterality: N/A;   TOTAL HIP ARTHROPLASTY Right 11/23/2011   TOTAL HIP ARTHROPLASTY Left 09/07/2011   TOTAL KNEE ARTHROPLASTY Right 02/15/2018   VASECTOMY   1978   Patient Active Problem List   Diagnosis Date Noted   Dizziness on standing 08/16/2022   Impaired fasting glucose 09/24/2021   Depression 09/24/2021   Sensorineural hearing loss, bilateral 09/24/2021   Hypothyroid 09/24/2021   Hyperlipidemia 09/24/2021   Sleep apnea 09/24/2021   Hallux limitus, acquired, right 11/12/2019   Pre-diabetes 10/17/2019   Recurrent major depressive disorder, in partial remission (HCC) 04/16/2019   Obesity (BMI 30.0-34.9) 04/16/2019   Vasomotor rhinitis 10/25/2018   Hearing loss 08/28/2018   Osteoarthritis 08/28/2018   Tinnitus 08/28/2018   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 (HCC) 07/31/2018   Slurred speech 07/23/2018   Right arm weakness 07/23/2018   Hypokalemia 07/23/2018   Anxiety 07/20/2018   Atherosclerosis of native coronary artery of native heart with stable angina pectoris (HCC) 06/16/2018   Hx of CABG 06/16/2018   Mixed hyperlipidemia 06/16/2018   Benign essential HTN 06/16/2018   History of cerebrovascular accident (CVA) with residual deficit 06/07/2018   Systolic CHF (HCC) 02/15/2018   Hypertension 02/15/2018   S/P total knee arthroplasty, right 02/15/2018   Primary osteoarthritis of both knees 11/25/2017   CAD (coronary artery disease) 11/12/2016   Abnormal nuclear stress test 06/30/2016   Hallux limitus of right  foot 10/22/2015   Anesthesia complication 10/10/2015   GERD (gastroesophageal reflux disease) 10/10/2015   Hypothyroidism 10/10/2015   Coronary artery disease involving native coronary artery of native heart with angina pectoris (HCC) 10/10/2015   OSA on CPAP 01/10/2007   Diagnosis unknown 01/12/2003    ONSET DATE: Referral Date: 08/13/2022 (Acute on Chronic Dizziness)  REFERRING DIAG: R42 (ICD-10-CM) - Dizziness on standing  THERAPY DIAG:  Repeated falls  Unsteadiness on feet  Other abnormalities of gait and mobility  Rationale for Evaluation and Treatment:  Rehabilitation  PERTINENT HISTORY: Anxiety, Arthritis, CAD, CHF, CKD, COPD, Depression, Hx of CVA, Hypothyroidism, OSA, Paroxysmal A-Fib   PRECAUTIONS: None  SUBJECTIVE: Pt reports doing well today. Pt denies any recent falls/stumbles since prior session. Pt denies any updates to medications or medical appointment since prior session. Pt reports good compliance with HEP when time permits.    PAIN:  Are you having pain? No  Vitals:   OBJECTIVE: GAIT: Gait pattern: step through pattern Distance walked: into/out of clinic Assistive device utilized: None Level of assistance: Modified independence Comments: mild unsteadiness with increased gait speed noted; cues for pacing.    Treatment  TE- To improve strength, endurance, mobility, and function of specific targeted muscle groups or improve joint range of motion or improve muscle flexibility  LAQ 2 x 10 ea LE   TA- To improve functional movements patterns for everyday tasks  Ambulation with 3# AW x 557ft STS 3 x 10  Ambulation with 3#AW x 320 ft    NMR: To facilitate reeducation of movement, balance, posture, coordination, and/or proprioception/kinesthetic sense.  Resisted gait with matrix cable machine and 12.5# resistance on belt x 4 laps ea direction, cues for slow and controlled movement   Airex stance no UE support in adducted stance - 2 x 15 head turns lateral and 2 x 15 head nods   Airex step on / off x 10 ant/post step, 1 LOB with foot catching on mat, able to self correct with UE assist   Unless otherwise stated, CGA was provided and gait belt donned in order to ensure pt safety   PATIENT EDUCATION: Education details: Continue HEP Person educated: Patient and Spouse Education method: Explanation Education comprehension: verbalized understanding   HOME EXERCISE PROGRAM: Access Code: [email or web address]   GOALS: Goals reviewed with patient? Yes     SHORT TERM GOALS: Target date: 11/26/2022   1.  Pt will be able to  hold situation 4 of M-CTSIB for >/= 10 seconds Baseline: 7-8 seconds; 10.3 seconds Goal status: MET    LONG TERM GOALS: Target date: 12/24/2022   Pt will improve gait speed to >/= 1.2 m/sec to demonstrate improved community ambulation Baseline: 1.10 m/s; 1.15 m/s  Goal status: NOT MET   2.  Pt will improve 30 second STS test >/= 13 reps to demo improved functional LE strength and balance  Baseline: 11 reps; 12 reps Goal status: NOT MET   3. Pt will be able to hold situation 4 of M-CTSIB for >/= 15 seconds Baseline: 7-8 seconds; 10.5 seconds  Goal status: NOT MET   4.  Pt will be independent with final HEP for dynamic gait and balance to reduce fall risk.  Baseline: reports IND with HEP (will benefit from progressive HEP Goal status: MET  5.  Pt will improve DGI by 3 points from baseline to demonstrate improved balance and reduced fall risk Baseline: 15/24; 18/24 Goal status: MET Updated Goals:   SHORT TERM GOALS: Target date: 01/21/2023  1.  Pt will improve 30 second STS test >/= 13 reps to demo improved functional LE strength and balance  Baseline: 11 reps; 12 reps Goal status: INITIAL    LONG TERM GOALS: Target date: 02/18/2023   Pt will improve gait speed to >/= 1.2 m/sec to demonstrate improved community ambulation Baseline: 1.10 m/s; 1.15 m/s  Goal status: ON-GOING   2.  Pt will improve ABC Scale to >/= 75% to demonstrate improved confidence/balance with daily activities Baseline: 69% Goal status: INITIAL   3. Pt will be able to hold situation 4 of M-CTSIB for >/= 15 seconds Baseline: 7-8 seconds; 10.5 seconds  Goal status: ON-GOING   4.  Pt will be independent with final HEP for dynamic gait and balance to reduce fall risk.  Baseline: reports IND with HEP (will benefit from progressive HEP) Goal status: ON-GOING  5.  Pt will improve DGI to >/= 19/24 to demonstrate improved balance and reduced fall risk Baseline: 15/24; 18/24 Goal status: ON-GOING ASSESSMENT:    CLINICAL IMPRESSION:  Patient presents with good motivation for completion of physical therapy activities.  Physical therapy targeted activities to improve patient's lower extremity strength and endurance and also improve patient's ability to ambulate on incline and decline surfaces. Pt showing progress with all activities at this time, plan for discharge next session unless there is some kind of negative change.   Pt showing and reporting good progress with his balance and mobility at times.  Pt will continue to benefit from skilled physical therapy intervention to address impairments, improve QOL, and attain therapy goals.    OBJECTIVE IMPAIRMENTS: Abnormal gait, decreased activity tolerance, decreased balance, decreased mobility, difficulty walking, decreased strength, decreased safety awareness, and dizziness.    ACTIVITY LIMITATIONS: bending, sitting, standing, transfers, bed mobility, and reach over head   PARTICIPATION LIMITATIONS: driving, community activity, and yard work   PERSONAL FACTORS: Age, Time since onset of injury/illness/exacerbation, and 3+ comorbidities: Anxiety, Arthritis, CAD, CHF, CKD, COPD, Depression, Hx of CVA, Hypothyroidism, OSA, Paroxysmal A-Fib   are also affecting patient's functional outcome.    REHAB POTENTIAL: Good   CLINICAL DECISION MAKING: Evolving/moderate complexity   EVALUATION COMPLEXITY: Moderate     PLAN:   PT FREQUENCY: 1x/week   PT DURATION: 8 weeks   PLANNED INTERVENTIONS: Therapeutic exercises, Therapeutic activity, Neuromuscular re-education, Balance training, Gait training, Patient/Family education, Self Care, Joint mobilization, Stair training, Vestibular training, Canalith repositioning, DME instructions, Cryotherapy, Moist heat, Manual therapy, and Re-evaluation   PLAN FOR NEXT SESSION: Dynamic balance activities; Dynamic Gait; Functional strengthening. Rockerboard. Has posterior lean with challenging balance activities.  Work on  incline and decline surfaces controlling speed preventing any shuffling gait or uncontrolled gait on downhill/decline D/C if appropriate    Norman Herrlich PT ,DPT Physical Therapist- [facility]  [facility]  02/10/23 10:29 AM

## 2023-02-11 ENCOUNTER — Other Ambulatory Visit: Payer: Medicare PPO | Admitting: Pharmacist

## 2023-02-11 DIAGNOSIS — I10 Essential (primary) hypertension: Secondary | ICD-10-CM

## 2023-02-11 NOTE — Progress Notes (Unsigned)
02/11/2023 Name: Luke Mckee MRN: [id number] DOB: 1940/10/11  Chief Complaint  Patient presents with   Medication Management    Chistopher Mangino is a 83 y.o. year old male who presented for a telephone visit.   They were referred to the pharmacist by their PCP for assistance in managing complex medication management and medication adherence   Subjective:  Care Team: Primary Care Provider: [name], DO ; Next Scheduled Visit: 03/25/2023 Cardiologist: [name], MD; Next Schedule Visit: 02/28/2023 Neurologist: Gelene Mink, PA Physical Therapist: Howie Ill, PT; Next Scheduled Appointment: 02/17/2023 Nurse Care Manager: Juanell Fairly, RN; Next Schedule Appointment: 02/16/2023    Medication Access/Adherence  Current Pharmacy:  [facility] [phone] [facility],  - [address] AT [facility] [address] [place] [address] Phone: [phone] Fax: [phone]   Patient reports affordability concerns with their medications: No  Patient reports access/transportation concerns to their pharmacy: No  Patient reports adherence concerns with their medications:  Yes     Uses weekly pillbox to organize patient's medications   [name] wife manages patient's medications using weekly pillbox. Note previously discussed option to switch to pill packaging to aid with adherence/decrease burden, but at this time patient and wife prefer to continue to use weekly pillbox  Reports has noticed an improvement in balance with Physical Therapy   Hypertension:   Current medications:  - atenolol 25 mg daily in morning - furosemide 80 mg - reports taking once daily, but up to twice daily as needed   Patient has a validated, automated, upper arm home BP cuff Reports last checked home BP on 1/28, recalls reading~ 113/62, HR 62   Denies signs of hypotension, such as dizziness, since started physical therapy  Denies signs of  swelling today   Current Physical Activity: working with Physical Therapy through [facility]       Objective:  Lab Results  Component Value Date   HGBA1C 6.1 (H) 12/15/2022    Lab Results  Component Value Date   CREATININE 0.95 01/26/2023   BUN 15 01/26/2023   NA 141 01/26/2023   K 5.0 01/26/2023   CL 100 01/26/2023   CO2 24 01/26/2023    Lab Results  Component Value Date   CHOL 104 12/15/2022   HDL 39 (L) 12/15/2022   LDLCALC 50 12/15/2022   TRIG 74 12/15/2022   CHOLHDL 2.7 12/15/2022    Medications Reviewed Today   Medications were not reviewed in this encounter       Assessment/Plan:   Hypertension: - Reviewed long term cardiovascular and renal outcomes of uncontrolled blood pressure - Remind patient to continue to take positional changes slowly - Recommend to continue to monitor home blood pressure, keep log of results and have this record to review at upcoming medical appointments. Patient to contact provider office sooner if needed for readings outside of established parameters or symptoms     Medication Management: - Discussed collaboration with local pharmacies for adherence packaging. Reviewed local pharmacies with adherence packaging options.  Note patient has both [employer] and [employer] prescription coverage. Per review of pharmacy list from [employer], appears that [facility] is in-network - Place 3-way call to [facility] today with caregiver on the line to address patient's questions regarding cost of pill packaging and delivery services.  Caregiver elects to plan to initiate pill packaging with [facility] once has determined if patient will  maintain current furosemide dose - Agree to follow up in 3 weeks to determine whether to move forward will pill packaging plan Until then, caregiver plans to continue to use weekly pillbox and copy of med list from latest office visit to aid with medication adherence     Follow Up Plan:  Clinical Pharmacist will follow up with patient by telephone on ***    Estelle Grumbles, PharmD, [place], CPP Clinical Pharmacist [facility] [phone]

## 2023-02-12 NOTE — Patient Instructions (Signed)
Goals Addressed             This Visit's Progress    Pharmacy Goals       Check your blood pressure twice weekly, and any time you have concerning symptoms like headache, chest pain, dizziness, shortness of breath, or vision changes.   To appropriately check your blood pressure, make sure you do the following:  1) Avoid caffeine, exercise, or tobacco products for 30 minutes before checking. Empty your bladder. 2) Sit with your back supported in a flat-backed chair. Rest your arm on something flat (arm of the chair, table, etc). 3) Sit still with your feet flat on the floor, resting, for at least 5 minutes.  4) Check your blood pressure. Take 1-2 readings.  5) Write down these readings and bring with you to any provider appointments.  Bring your home blood pressure machine with you to a provider's office for accuracy comparison at least once a year.   Make sure you take your blood pressure medications before you come to any office visit, even if you were asked to fast for labs.   Our goal bad cholesterol, or LDL, is less than 70 . This is why it is important to continue taking your atorvastatin  Feel free to call me with any questions or concerns. I look forward to our next call!   Estelle Grumbles, PharmD, [facility] Clinical Pharmacist [facility] [phone]

## 2023-02-16 ENCOUNTER — Other Ambulatory Visit: Payer: Self-pay

## 2023-02-16 NOTE — Patient Outreach (Signed)
 Care Management   Visit Note  02/16/2023 Name: Luke Mckee MRN: [id number] DOB: 1940-12-29  Subjective: Luke Mckee is a 83 y.o. year old male who is a primary care patient of Luke Marsa PARAS, DO. The Care Management team was consulted for assistance.      Engaged with Luke Mckee and his spouse via telephone.  Assessment:  Review of patient past medical history, allergies, medications, health status, including review of consultants reports, laboratory and other test data, was performed as part of  evaluation and provision of care management services.   Outpatient Encounter Medications as of 02/16/2023  Medication Sig Note   atenolol  (TENORMIN ) 25 MG tablet Take 1 tablet (25 mg total) by mouth daily. 02/16/2023: Reports taking in the morning as advised by Cardiology team   acetaminophen  (TYLENOL ) 650 MG CR tablet Take 1,300 mg by mouth at bedtime as needed.    albuterol  (VENTOLIN  HFA) 108 (90 Base) MCG/ACT inhaler Inhale 1-2 puffs into the lungs every 4 (four) hours as needed for wheezing or shortness of breath (cough). 12/28/2022: Reports not using frequently   aspirin  EC 81 MG tablet Take 81 mg by mouth daily.    atorvastatin  (LIPITOR ) 80 MG tablet Take 1 tablet (80 mg total) by mouth daily.    Calcium  Carb-Cholecalciferol (CALCIUM  600 + D PO) Take 1 tablet by mouth daily.    Cholecalciferol (VITAMIN D) 50 MCG (2000 UT) tablet Take 2,000 Units by mouth daily.    clopidogrel  (PLAVIX ) 75 MG tablet Take 1 tablet (75 mg total) by mouth daily.    clotrimazole -betamethasone  (LOTRISONE ) cream Apply twice a day for 1-2 weeks, may repeat if need in future (Patient not taking: Reported on 12/21/2022)    Coenzyme Q10 100 MG TABS Take 100 mg by mouth daily.    colchicine  0.6 MG tablet For acute gout flare take 2 pills at once, then take a 3rd pill 2 hours later. Then take 1 pill daily for 7-10 days or until resolved. (Patient taking differently: Take 0.6 mg by mouth daily as needed. For acute gout  flare take 2 pills at once, then take a 3rd pill 2 hours later. Then take 1 pill daily for 7-10 days or until resolved.) 02/16/2023: Reports not recent flares   diclofenac sodium (VOLTAREN) 1 % GEL Apply 2 g topically 4 (four) times daily as needed for pain. Foot pain    escitalopram  (LEXAPRO ) 20 MG tablet Take 1 tablet (20 mg total) by mouth daily.    furosemide  (LASIX ) 80 MG tablet TAKE 1 TABLET(80 MG) BY MOUTH TWICE DAILY (Patient taking differently: Take 80 mg by mouth daily.) 12/28/2022: Reports taking differently. Reports he can take a second tab prn for swelling   ipratropium (ATROVENT ) 0.06 % nasal spray Place 2 sprays into both nostrils 4 (four) times daily as needed for rhinitis.    levothyroxine  (SYNTHROID ) 125 MCG tablet TAKE 1 TABLET(125 MCG) BY MOUTH DAILY BEFORE BREAKFAST    loratadine  (CLARITIN ) 10 MG tablet Take 10 mg by mouth daily as needed.    LORazepam  (ATIVAN ) 0.5 MG tablet Take 1 tablet (0.5 mg total) by mouth daily as needed for anxiety or sleep. 02/16/2023: Reports not requiring frequent use   meclizine  (ANTIVERT ) 12.5 MG tablet Take 1 tablet (12.5 mg total) by mouth 3 (three) times daily as needed for dizziness. (Patient not taking: Reported on 02/16/2023) 02/16/2023: Reports not requiring for several weeks.    montelukast  (SINGULAIR ) 10 MG tablet Take 1 tablet (10 mg total) by mouth at  bedtime.    Multiple Vitamin (MULTIVITAMIN) tablet Take 1 tablet by mouth daily.    nitroGLYCERIN  (NITROSTAT ) 0.4 MG SL tablet Place 1 tablet (0.4 mg total) under the tongue every 5 (five) minutes as needed for chest pain. 02/16/2023: Reports not requiring use   PATADAY 0.1 % ophthalmic solution Place into both eyes as needed.     sennosides-docusate sodium  (SENOKOT-S) 8.6-50 MG tablet Take 1-2 tablets by mouth daily as needed for constipation.    traZODone  (DESYREL ) 50 MG tablet Take 1-2 tablets (50-100 mg total) by mouth at bedtime. (Patient taking differently: Take 50 mg by mouth at bedtime.)    No  facility-administered encounter medications on file as of 02/16/2023.    Interventions:  Goals      Monitor, Self-Manage and Reduce Symptoms of Heart Failure and A Fib     Current Barriers:  Care Management support and education needs related to CHF and A-Fib  Planned Interventions: Reviewed current treatment plans, self management and patient's adherence to plan as established by provider. Reviewed medications. Reports taking as prescribed. Spouse is assisting with medication preparation.  Discussed bleeding risk associated with anticoagulant therapy and importance of self-monitoring for signs/symptoms of bleeding. Reviewed information regarding recommended weight parameters. Encouraged to weigh daily and record readings.  Encouraged to notify provider for weight gain greater than 2 lbs overnight or weight gain greater than 5 lbs within a week. Reports weighing as instructed. Reports have been within range. Weight today was 209 lbs. Discussed s/sx of fluid overload and indications for notifying a provider. Denies abdominal swelling. Reports occasional swelling in lower extremities. Reports he is taking diuretic as prescribed. Aware that a second dose can be taken for increased edema. Continues to experience dyspnea with moderate activity. Anticipates completing vestibular therapy this month. Overall reports doing well and feels CHF has been well controlled.  Reviewed safety and fall prevention measures. Denies falls since last outreach. Advised to seek medical attention if he experiences a head injury or if there is blood in the urine or stool. Reports discussing with the Cardiology team. Encouraged to assess symptoms daily and contact clinic with changes. Pending completion Stress Test this month. Discussed nutritional intake. Advised to closely monitor sodium consumption and avoid highly processed foods when possible. Reviewed worsening s/sx related to CHF exacerbation and indications for seeking  immediate medical attention.  Wt Readings from Last 3 Encounters:  01/26/23 215 lb (97.5 kg)  12/21/22 214 lb (97.1 kg)  11/08/22 212 lb 4 oz (96.3 kg)    Patient Goals/Self-Care Activities:  Take all medications as prescribed Attend all scheduled provider visits Monitor weight and record readings Notify provider for weight gain outside of established parameters Follow recommended safety and fall prevention measures Seek medical attention if you experience a head injury or if there is blood in the urine or stool Adhere to recommended cardiac prudent/heart healthy diet Contact provider or care management team with questions and new concerns as needed.      Monitor, Self-Manage, and Reduce Symptoms of Hypertension and Hyperlipidemia     Current Barriers:  Care Management support and education needs related to Hypertension and Hyperlipidemia  Planned Interventions: Reviewed current treatments plan, self-management, and adherence to plan as established by provider.  Reviewed medications and indications for use. Reports taking medications as prescribed. Spouse is assisting with preparing medications. Reviewed role and benefits of statin for ASCVD risk reduction. Discussed strategies to manage statin-induced myalgias. Advised to take medications as prescribed and notify provider with  concerns r/t side effects. Provided information regarding established blood pressure parameters along with indications for notifying a provider. Reports not monitoring daily. Advised to monitor a few times a week if unable to monitor daily and record readings.  Reviewed symptoms. Denies chest pain or palpitations. Denies headaches, visual changes.  Reports a few episodes of dizziness. Discussed activity tolerance. Anticipates completing vestibular therapy this month. Denies changes or decline in activity level. Reports significant improvement with fatigue. Reports taking trazadone at bedtime as instructed.  Reports this has significantly improved his sleep patterns resulting in improved energy and wakefulness during the day.  Reviewed safety and fall prevention measure. Advised to consider use of an assistive device as needed if changes with balance are noted.  Discussed compliance with recommended cardiac prudent diet. Encouraged to read nutrition labels, continue monitoring sodium intake, and avoid highly processed foods when possible.  Reviewed pending appointments. Pending a stress test this month. Advised to complete labs as ordered. Reviewed s/sx of heart attack, stroke and worsening symptoms that require immediate medical attention.  BP Readings from Last 3 Encounters:  01/26/23 112/68  12/21/22 110/64  11/08/22 100/70     Lab Results  Component Value Date   CHOL 104 12/15/2022   HDL 39 (L) 12/15/2022   LDLCALC 50 12/15/2022   TRIG 74 12/15/2022   CHOLHDL 2.7 12/15/2022     Symptom Management: Take medications as prescribed   Attend all scheduled provider appointments Call pharmacy for medication refills 3-7 days in advance of running out of medications Check blood pressure and record readings Complete labs as ordered Call doctor if your blood pressure is outside of established parameters Follow recommended safety and fall prevention measures Call provider office for new concerns or questions             PLAN Will follow up next month   Jackson Acron [facility] RN Care Manager Direct Dial: [phone]  Fax: [phone] Website: [email or web address]

## 2023-02-16 NOTE — Therapy (Signed)
 OUTPATIENT PHYSICAL THERAPY TREATMENT NOTE/ DISCHARGE PT    Patient Name: Luke Mckee MRN: [id number] DOB:1940-02-10, 83 y.o., male Today's Date: 02/17/2023  PCP: Edman Marsa PARAS, DO  REFERRING PROVIDER: Marylene Rocky BRAVO, PA-C   END OF SESSION:  PT End of Session - 02/17/23 1143     Visit Number 18    Number of Visits 20    Date for PT Re-Evaluation 02/18/23    Authorization Type [employer]    Progress Note Due on Visit 20    PT Start Time 1145    PT Stop Time 1224    PT Time Calculation (min) 39 min    Equipment Utilized During Treatment Gait belt    Activity Tolerance Patient tolerated treatment well    Behavior During Therapy WFL for tasks assessed/performed                 Past Medical History:  Diagnosis Date   Anxiety    Arthritis    CAD (coronary artery disease) 2005   s/p CABG, DES   CHF (congestive heart failure) (HCC)    in EPIC care everywhere   Chronic kidney disease    COPD (chronic obstructive pulmonary disease) (HCC)    in EPIC care everywhere   Depression    Heart murmur    History of stroke    Hypothyroidism    OSA (obstructive sleep apnea)    Paroxysmal A-fib (HCC)    in EPIC careeverywhere   Past Surgical History:  Procedure Laterality Date   APPENDECTOMY     BUNIONECTOMY     CATARACT EXTRACTION     Right eye   CATARACT EXTRACTION W/PHACO Left 01/19/2021   Procedure: CATARACT EXTRACTION PHACO AND INTRAOCULAR LENS PLACEMENT (IOC) LEFT 3.09 00:28.3;  Surgeon: Myrna Adine Anes, MD;  Location: [facility];  Service: Ophthalmology;  Laterality: Left;   COLONOSCOPY  07/06/2004   CORONARY ARTERY BYPASS GRAFT  2005   LOOP RECORDER INSERTION N/A 07/25/2018   Procedure: LOOP RECORDER INSERTION;  Surgeon: Inocencio Soyla Lunger, MD;  Location: [facility];  Service: Cardiovascular;  Laterality: N/A;   TOTAL HIP ARTHROPLASTY Right 11/23/2011   TOTAL HIP ARTHROPLASTY Left 09/07/2011   TOTAL KNEE ARTHROPLASTY Right  02/15/2018   VASECTOMY  1978   Patient Active Problem List   Diagnosis Date Noted   Dizziness on standing 08/16/2022   Impaired fasting glucose 09/24/2021   Depression 09/24/2021   Sensorineural hearing loss, bilateral 09/24/2021   Hypothyroid 09/24/2021   Hyperlipidemia 09/24/2021   Sleep apnea 09/24/2021   Hallux limitus, acquired, right 11/12/2019   Pre-diabetes 10/17/2019   Recurrent major depressive disorder, in partial remission (HCC) 04/16/2019   Obesity (BMI 30.0-34.9) 04/16/2019   Vasomotor rhinitis 10/25/2018   Hearing loss 08/28/2018   Osteoarthritis 08/28/2018   Tinnitus 08/28/2018   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 (HCC) 07/31/2018   Slurred speech 07/23/2018   Right arm weakness 07/23/2018   Hypokalemia 07/23/2018   Anxiety 07/20/2018   Atherosclerosis of native coronary artery of native heart with stable angina pectoris (HCC) 06/16/2018   Hx of CABG 06/16/2018   Mixed hyperlipidemia 06/16/2018   Benign essential HTN 06/16/2018   History of cerebrovascular accident (CVA) with residual deficit 06/07/2018   Systolic CHF (HCC) 02/15/2018   Hypertension 02/15/2018   S/P total knee arthroplasty, right 02/15/2018   Primary osteoarthritis of both knees 11/25/2017   CAD (coronary artery disease) 11/12/2016   Abnormal nuclear stress test 06/30/2016  Hallux limitus of right foot 10/22/2015   Anesthesia complication 10/10/2015   GERD (gastroesophageal reflux disease) 10/10/2015   Hypothyroidism 10/10/2015   Coronary artery disease involving native coronary artery of native heart with angina pectoris (HCC) 10/10/2015   OSA on CPAP 01/10/2007   Diagnosis unknown 01/12/2003    ONSET DATE: Referral Date: 08/13/2022 (Acute on Chronic Dizziness)  REFERRING DIAG: R42 (ICD-10-CM) - Dizziness on standing  THERAPY DIAG:  Repeated falls  Unsteadiness on feet  Other abnormalities of gait and mobility  Dizziness and giddiness  Rationale  for Evaluation and Treatment: Rehabilitation  PERTINENT HISTORY: Anxiety, Arthritis, CAD, CHF, CKD, COPD, Depression, Hx of CVA, Hypothyroidism, OSA, Paroxysmal A-Fib   PRECAUTIONS: None  SUBJECTIVE: Pt reports doing well today. Pt denies any recent falls/stumbles since prior session. Pt denies any updates to medications or medical appointment since prior session. Pt reports good compliance with HEP when time permits.    PAIN:  Are you having pain? No  Vitals:   OBJECTIVE: GAIT: Gait pattern: step through pattern Distance walked: into/out of clinic Assistive device utilized: None Level of assistance: Modified independence Comments: mild unsteadiness with increased gait speed noted; cues for pacing.    Treatment    NMR: To facilitate reeducation of movement, balance, posture, coordination, and/or proprioception/kinesthetic sense.  Resisted gait with matrix cable machine and 12.5# resistance on belt x 4 laps ea direction, cues for slow and controlled movement X 2 lateral ea direction   Airex stance no UE support in adducted stance - 2 x 15 head turns lateral and 2 x 15 head nods   Airex step on / off x 10 ant/post step, 1 LOB with foot catching on mat, able to self correct with UE assist   Unless otherwise stated, CGA was provided and gait belt donned in order to ensure pt safety   PATIENT EDUCATION: Education details: Continue HEP Person educated: Patient and Spouse Education method: Explanation Education comprehension: verbalized understanding   HOME EXERCISE PROGRAM: Access Code: [email or web address]   GOALS: Goals reviewed with patient? Yes     SHORT TERM GOALS: Target date: 11/26/2022   1.  Pt will be able to hold situation 4 of M-CTSIB for >/= 10 seconds Baseline: 7-8 seconds; 10.3 seconds Goal status: MET    LONG TERM GOALS: Target date: 12/24/2022   Pt will improve gait speed to >/= 1.2 m/sec to demonstrate improved community ambulation Baseline: 1.10 m/s; 1.15  m/s  Goal status: NOT MET   2.  Pt will improve 30 second STS test >/= 13 reps to demo improved functional LE strength and balance  Baseline: 11 reps; 12 reps Goal status: NOT MET   3. Pt will be able to hold situation 4 of M-CTSIB for >/= 15 seconds Baseline: 7-8 seconds; 10.5 seconds  Goal status: NOT MET   4.  Pt will be independent with final HEP for dynamic gait and balance to reduce fall risk.  Baseline: reports IND with HEP (will benefit from progressive HEP Goal status: MET  5.  Pt will improve DGI by 3 points from baseline to demonstrate improved balance and reduced fall risk Baseline: 15/24; 18/24 Goal status: MET Updated Goals:   SHORT TERM GOALS: Target date: 01/21/2023   1.  Pt will improve 30 second STS test >/= 13 reps to demo improved functional LE strength and balance  Baseline: 11 reps; 12 reps 2/6: 12 reps  Goal status: NOT MET   LONG TERM GOALS: Target date: 02/18/2023   Pt  will improve gait speed to >/= 1.2 m/sec to demonstrate improved community ambulation Baseline: 1.10 m/s; 1.15 m/s 02/17/23: 1.176 m/s Goal status: NOT MET    2.  Pt will improve ABC Scale to >/= 75% to demonstrate improved confidence/balance with daily activities Baseline: 69% 2/6:93% Goal status: MET    3. Pt will be able to hold situation 4 of M-CTSIB for >/= 15 seconds Baseline: 7-8 seconds; 10.5 seconds  2/6: 30 sec Goal status: MET   4.  Pt will be independent with final HEP for dynamic gait and balance to reduce fall risk.  Baseline: reports IND with HEP (will benefit from progressive HEP) 02/17/23: Pt completing provided HEP regularly and participating in daily activities around home Goal status: MET  5.  Pt will improve DGI to >/= 19/24 to demonstrate improved balance and reduced fall risk Baseline: 15/24; 18/24 02/17/23: 21 Goal status: MET ASSESSMENT:   CLINICAL IMPRESSION:  Patient presents with good motivation for completion of physical therapy activities.  Patient  presents for final physical therapy treatment session.  Patient has met her nearly met all of his goals showing significant improvement in his balance and quality of life.  Patient will continue with home exercise program independently and is comfortable with this plan.  Patient will be formally discharged from physical therapy at this time with all needs met and all questions answered.   OBJECTIVE IMPAIRMENTS: Abnormal gait, decreased activity tolerance, decreased balance, decreased mobility, difficulty walking, decreased strength, decreased safety awareness, and dizziness.    ACTIVITY LIMITATIONS: bending, sitting, standing, transfers, bed mobility, and reach over head   PARTICIPATION LIMITATIONS: driving, community activity, and yard work   PERSONAL FACTORS: Age, Time since onset of injury/illness/exacerbation, and 3+ comorbidities: Anxiety, Arthritis, CAD, CHF, CKD, COPD, Depression, Hx of CVA, Hypothyroidism, OSA, Paroxysmal A-Fib   are also affecting patient's functional outcome.    REHAB POTENTIAL: Good   CLINICAL DECISION MAKING: Evolving/moderate complexity   EVALUATION COMPLEXITY: Moderate     PLAN:   PT FREQUENCY: 1x/week   PT DURATION: 8 weeks   PLANNED INTERVENTIONS: Therapeutic exercises, Therapeutic activity, Neuromuscular re-education, Balance training, Gait training, Patient/Family education, Self Care, Joint mobilization, Stair training, Vestibular training, Canalith repositioning, DME instructions, Cryotherapy, Moist heat, Manual therapy, and Re-evaluation   PLAN FOR NEXT SESSION: D/C today   Lonni KATHEE Gainer PT ,DPT Physical Therapist- [facility]  [facility]  02/17/23 3:20 PM

## 2023-02-17 ENCOUNTER — Ambulatory Visit: Payer: Medicare PPO | Attending: Physician Assistant | Admitting: Physical Therapy

## 2023-02-17 DIAGNOSIS — R2681 Unsteadiness on feet: Secondary | ICD-10-CM | POA: Diagnosis not present

## 2023-02-17 DIAGNOSIS — R42 Dizziness and giddiness: Secondary | ICD-10-CM | POA: Insufficient documentation

## 2023-02-17 DIAGNOSIS — R2689 Other abnormalities of gait and mobility: Secondary | ICD-10-CM | POA: Insufficient documentation

## 2023-02-17 DIAGNOSIS — R296 Repeated falls: Secondary | ICD-10-CM | POA: Diagnosis not present

## 2023-02-24 ENCOUNTER — Ambulatory Visit: Payer: Medicare PPO | Admitting: Physical Therapy

## 2023-02-28 ENCOUNTER — Encounter: Payer: Self-pay | Admitting: Medical

## 2023-02-28 ENCOUNTER — Ambulatory Visit: Payer: Medicare PPO | Attending: Medical | Admitting: Medical

## 2023-02-28 VITALS — BP 110/62 | HR 66 | Ht 68.0 in | Wt 221.1 lb

## 2023-02-28 DIAGNOSIS — I10 Essential (primary) hypertension: Secondary | ICD-10-CM | POA: Diagnosis not present

## 2023-02-28 DIAGNOSIS — I2510 Atherosclerotic heart disease of native coronary artery without angina pectoris: Secondary | ICD-10-CM

## 2023-02-28 DIAGNOSIS — I5032 Chronic diastolic (congestive) heart failure: Secondary | ICD-10-CM

## 2023-02-28 DIAGNOSIS — R0600 Dyspnea, unspecified: Secondary | ICD-10-CM

## 2023-02-28 NOTE — Progress Notes (Signed)
 Cardiology Office Note:  .   Date:  02/28/2023  ID:  Jobe Mutch, DOB 06-24-1940, MRN [id number] PCP: [name], DO  [facility] Providers Cardiologist:  Julien Nordmann, MD     History of Present Illness: .   Luke Mckee is a 83 y.o. male CAD s/p CABG x3 in [place] with subsequent stent placement, anxiety, OSA on CPAP, hypertension, history of stroke x2, status post loop monitor with no A-fib detected who presents for follow-up.   Patient has a history of CAD with CABG x 3 in 2005. He underwent DES to OM SVG in 2018.  Patient reported another stent placement, however records unclear as to when or where.   Patient has a history of stroke x 2.  He has a loop recorder implanted with no evidence of A-fib so far. The look recorder is no longer transmitting, but still in place.   Patient was seen in the ER 07/29/2022 for pre-syncope and dizziness.  Blood pressure 155/114 pulse 65.  EKG showed normal sinus rhythm.  Labs were reassuring.  CT head and MRI brain were nonacute.  Patient was given meclizine, fluids and Atenolol was cut in half. Patient was seen in follow-up in July 2024 blood pressure was low, orthostatics were negative.  Spironolactone was stopped.  Patient was last seen in January 26, 2023 reporting intermittent shortness of breath.  Echo and stress test were ordered.  Today, the echo was reviewed. Breathing is a little better. Still feels like if he walks a good distance he gets winded.  No chest pain. Stress test is scheduled to be done in 3 days. He takes lasix 80mg  BID     Studies Reviewed: .     Echo 01/2023 1. Left ventricular ejection fraction, by estimation, is 55 to 60%. The  left ventricle has normal function. The left ventricle has no regional  wall motion abnormalities. Left ventricular diastolic parameters are  consistent with Grade I diastolic  dysfunction (impaired relaxation).   2. Right ventricular systolic function is normal. The right  ventricular  size is normal.   3. The mitral valve is normal in structure. Mild mitral valve  regurgitation.   4. The aortic valve is tricuspid. Aortic valve regurgitation is not  visualized.   5. The inferior vena cava is normal in size with greater than 50%  respiratory variability, suggesting right atrial pressure of 3 mmHg.    Physical Exam:   VS:  BP 110/62 (BP Location: Left Arm, Patient Position: Sitting, Cuff Size: Normal)   Pulse 66   Ht 5\' 8"  (1.727 m)   Wt 221 lb 2 oz (100.3 kg)   SpO2 93%   BMI 33.62 kg/m    Wt Readings from Last 3 Encounters:  02/28/23 221 lb 2 oz (100.3 kg)  01/26/23 215 lb (97.5 kg)  12/21/22 214 lb (97.1 kg)    GEN: Well nourished, well developed in no acute distress NECK: No JVD; No carotid bruits CARDIAC: RRR, no murmurs, rubs, gallops RESPIRATORY:  Clear to auscultation without rales, wheezing or rhonchi  ABDOMEN: Soft, non-tender, non-distended EXTREMITIES:  No edema; No deformity   ASSESSMENT AND PLAN: .    DOE HFpEF Patient reports DOE is improved but still having to rest when walking longer distances and up stairs. He wears his CPAP every night. No chest pain reported. Echo showed LVEF 55-60%, no WMA, G1DD, normal RVSF, mild MR.  The patient takes lasix 80mg  BID. He is euvolemic on exam. Cardiac  PET stress scheduled in 2 days. I suspect deconditioning is contributing.   CAD s/p CABG No chest pain reported. Plan for stress test as above. Continue ASA, Plavix, Atenolol and SL NTG.   HTN BP is normal today. He has a h/o orthostasis. Continue lasix and atenolol.     Dispo: Follow-up in 3 months  Signed, Mikeya Tomasetti David Stall, PA-C

## 2023-02-28 NOTE — Patient Instructions (Signed)
 Medication Instructions:  Your Physician recommend you continue on your current medication as directed.    *If you need a refill on your cardiac medications before your next appointment, please call your pharmacy*   Lab Work: None ordered at this time  If you have labs (blood work) drawn today and your tests are completely normal, you will receive your results only by: MyChart Message (if you have MyChart) OR A paper copy in the mail If you have any lab test that is abnormal or we need to change your treatment, we will call you to review the results.   Testing/Procedures: None ordered at this time    Follow-Up: At [facility], you and your health needs are our priority.  As part of our continuing mission to provide you with exceptional heart care, we have created designated Provider Care Teams.  These Care Teams include your primary Cardiologist (physician) and Advanced Practice Providers (APPs -  Physician Assistants and Nurse Practitioners) who all work together to provide you with the care you need, when you need it.   Your next appointment:   3 month(s)  Provider:   You may see Julien Nordmann, MD or one of the following Advanced Practice Providers on your designated Care Team:   Nicolasa Ducking, NP Eula Listen, PA-C Cadence Fransico Michael, PA-C Charlsie Quest, NP Carlos Levering, NP

## 2023-03-01 ENCOUNTER — Encounter (HOSPITAL_COMMUNITY): Payer: Self-pay

## 2023-03-02 ENCOUNTER — Telehealth (HOSPITAL_COMMUNITY): Payer: Self-pay | Admitting: Emergency Medicine

## 2023-03-02 NOTE — Telephone Encounter (Signed)
Attempted to call patient regarding upcoming cardiac PET appointment. Left message on voicemail with name and callback number Aidan Moten RN Navigator Cardiac Imaging [facility] [phone] Office [phone] Cell  

## 2023-03-03 ENCOUNTER — Ambulatory Visit: Payer: Medicare PPO

## 2023-03-03 ENCOUNTER — Ambulatory Visit: Payer: Medicare PPO | Admitting: Physical Therapy

## 2023-03-09 ENCOUNTER — Other Ambulatory Visit: Payer: Self-pay | Admitting: Family Medicine

## 2023-03-09 ENCOUNTER — Encounter: Payer: Self-pay | Admitting: Pharmacist

## 2023-03-09 DIAGNOSIS — E039 Hypothyroidism, unspecified: Secondary | ICD-10-CM

## 2023-03-09 NOTE — Telephone Encounter (Unsigned)
 Copied from CRM [id number]. Topic: Clinical - Medication Refill >> Mar 09, 2023  3:05 PM [name] wrote: Most Recent Primary Care Visit:  Provider: Estelle Grumbles A  Department: [id number]  Visit Type: PATIENT OUTREACH 30  Date: 02/11/2023  Medication: levothyroxine (SYNTHROID) 125 MCG tablet  Has the patient contacted their pharmacy? Yes (Agent: If no, request that the patient contact the pharmacy for the refill. If patient does not wish to contact the pharmacy document the reason why and proceed with request.) (Agent: If yes, when and what did the pharmacy advise?)  Is this the correct pharmacy for this prescription? Yes If no, delete pharmacy and type the correct one.  This is the patient's preferred pharmacy:  [id number], [place] - [address] AT [facility] [address] [place] [address] Phone: [phone] Fax: [phone]   Has the prescription been filled recently? Yes  Is the patient out of the medication? No   Has the patient been seen for an appointment in the last year OR does the patient have an upcoming appointment? Yes  Can we respond through MyChart? No  Agent: Please be advised that Rx refills may take up to 3 business days. We ask that you follow-up with your pharmacy.

## 2023-03-09 NOTE — Telephone Encounter (Signed)
 This encounter was created in error - please disregard.

## 2023-03-10 ENCOUNTER — Ambulatory Visit: Payer: Medicare PPO | Admitting: Physical Therapy

## 2023-03-17 ENCOUNTER — Ambulatory Visit: Payer: Medicare PPO | Admitting: Physical Therapy

## 2023-03-24 ENCOUNTER — Ambulatory Visit: Payer: Medicare PPO | Admitting: Physical Therapy

## 2023-03-25 ENCOUNTER — Encounter: Payer: Self-pay | Admitting: Family Medicine

## 2023-03-25 ENCOUNTER — Ambulatory Visit: Payer: Medicare PPO | Admitting: Family Medicine

## 2023-03-25 VITALS — BP 128/64 | HR 71 | Ht 68.0 in | Wt 218.0 lb

## 2023-03-25 DIAGNOSIS — I10 Essential (primary) hypertension: Secondary | ICD-10-CM | POA: Diagnosis not present

## 2023-03-25 DIAGNOSIS — R7303 Prediabetes: Secondary | ICD-10-CM | POA: Diagnosis not present

## 2023-03-25 DIAGNOSIS — N5089 Other specified disorders of the male genital organs: Secondary | ICD-10-CM | POA: Diagnosis not present

## 2023-03-25 DIAGNOSIS — I6930 Unspecified sequelae of cerebral infarction: Secondary | ICD-10-CM | POA: Diagnosis not present

## 2023-03-25 DIAGNOSIS — I5022 Chronic systolic (congestive) heart failure: Secondary | ICD-10-CM | POA: Diagnosis not present

## 2023-03-25 DIAGNOSIS — G4733 Obstructive sleep apnea (adult) (pediatric): Secondary | ICD-10-CM

## 2023-03-25 DIAGNOSIS — F3341 Major depressive disorder, recurrent, in partial remission: Secondary | ICD-10-CM | POA: Diagnosis not present

## 2023-03-25 DIAGNOSIS — R350 Frequency of micturition: Secondary | ICD-10-CM

## 2023-03-25 DIAGNOSIS — I25119 Atherosclerotic heart disease of native coronary artery with unspecified angina pectoris: Secondary | ICD-10-CM | POA: Diagnosis not present

## 2023-03-25 DIAGNOSIS — N529 Male erectile dysfunction, unspecified: Secondary | ICD-10-CM

## 2023-03-25 NOTE — Progress Notes (Signed)
 Subjective:    Patient ID: Luke Mckee, male    DOB: Oct 12, 1940, 83 y.o.   MRN: [id number]  Luke Mckee is a 83 y.o. male presenting on 03/25/2023 for No chief complaint on file.   HPI  Discussed the use of AI scribe software for clinical note transcription with the patient, who gave verbal consent to proceed.  History of Present Illness   Luke Mckee "Luke Mckee" is an 83 year old male who presents for a routine follow-up.  He manages his medications with some adjustments in timing. He takes levothyroxine an hour before other medications in the morning, followed by Lasix and other morning pills. He sometimes skips the second dose of Lasix, particularly on Sundays when attending church, and occasionally on other days if he forgets or is out for the day.  He has noticed a 'little bump' in the scrotum, which is not painful. There is concern due to his family history of prostate cancer, although his PSA levels have been normal for the past three years.  He reports issues with blood flow to the penis, affecting erectile function. No weaker urine stream is noted.     Remember Levothyroxine can wait 30 min then take other meds, not 1 hour. Last A1c 6.1  Completed vestibular rehab PT with improvement in balance and gait  He is controlling how he takes the Furosemide 80mg  daily and optional PM dose furosemide 80mg  if need or can take  Hearing Loss / Vestibular Dysfunction   Episodic Back Pain / L Shoulder Pain Chronic problems, episodic flares. L shoulder > R Using topical Voltaren AS NEEDED   History of CVA with residual deficit R sided weakness / multiple L MCA / TIA history HYPERTENSION Hyperlipidemia CAD s/p CABG with stable angina on nitrate  Followed by Neuro/Cards, see prior notes for background information. S/p loop recorder. No identified AFib. Denies any chest pain, new focal weakness, dyspnea, worsening edema, near syncope or syncope   LDL 50, at goal   Elevated A1c /  PreDM Obesity BMI >32 A1c 6.1   Hypothyroidism Chronic problem Last lab now 12/2022 showed normal Thyroid panel and T4 He is taking Levothyroxine daily, currently doing well - with some improvement after med adjustment of changing dosing so does not take other meds with levothyroxine.   Cardiology kept Lasix 80mg  TWICE A DAY, occasionally he may only take 1. - They reduced Atenolol to 25mg  daily. Also held Spironolactone.   Rarely needing Lorazepam   LEFT Knee OA/DJD Referral to [facility], discuss gel injections to delay replacement   LEFT Shoulder ALSO referral requested   Anxiety / Depression recurrent in partial remission - Interval update with improvement. Improved on SSRI Lexapro 10mg  x 2 = 20mg , needs new order   Feels more sedentary but goals to improve activity.   Psychological Insomnia Failed Ambien Improved on Trazodone nightly.   OSA on CPAP Followed by Pulmonology, no repeat sleep study - Today reports that sleep apnea is well controlled. He uses the CPAP machine every night. Tolerates the machine well, and thinks that sleeps better with it and feels good. No new concerns or symptoms.       03/25/2023    2:08 PM 12/21/2022    5:34 PM 10/22/2022    3:11 PM  Depression screen PHQ 2/9  Decreased Interest 1 0 0  Down, Depressed, Hopeless 1 0 0  PHQ - 2 Score 2 0 0  Altered sleeping 0 0 0  Tired, decreased energy  1 0 0  Change in appetite 0 0 0  Feeling bad or failure about yourself  1 0 0  Trouble concentrating 1  0  Moving slowly or fidgety/restless 0 0 0  Suicidal thoughts 0 0 0  PHQ-9 Score 5 0 0  Difficult doing work/chores Somewhat difficult Not difficult at all Not difficult at all       03/25/2023    2:08 PM 06/23/2022    2:50 PM 12/29/2021    2:48 PM 07/03/2021    3:37 PM  GAD 7 : Generalized Anxiety Score  Nervous, Anxious, on Edge 1 1 1 1   Control/stop worrying  1 1 1   Worry too much - different things 1 0 3 1  Trouble  relaxing  0 2 0  Restless  0 0 0  Easily annoyed or irritable  1 2 0  Afraid - awful might happen  0 2 0  Total GAD 7 Score  3 11 3   Anxiety Difficulty   Somewhat difficult Not difficult at all    Social History   Tobacco Use   Smoking status: Never   Smokeless tobacco: Never  Vaping Use   Vaping status: Never Used  Substance Use Topics   Alcohol use: Not Currently    Comment: past   Drug use: Never    Review of Systems Per HPI unless specifically indicated above     Objective:    BP 128/64   Pulse 71   Ht 5\' 8"  (1.727 m)   Wt 218 lb (98.9 kg)   SpO2 96%   BMI 33.15 kg/m   Wt Readings from Last 3 Encounters:  03/25/23 218 lb (98.9 kg)  02/28/23 221 lb 2 oz (100.3 kg)  01/26/23 215 lb (97.5 kg)    Physical Exam Vitals and nursing note reviewed.  Constitutional:      General: He is not in acute distress.    Appearance: He is well-developed. He is obese. He is not diaphoretic.     Comments: Well-appearing, comfortable, cooperative  HENT:     Head: Normocephalic and atraumatic.  Eyes:     General:        Right eye: No discharge.        Left eye: No discharge.     Conjunctiva/sclera: Conjunctivae normal.  Neck:     Thyroid: No thyromegaly.  Cardiovascular:     Rate and Rhythm: Normal rate and regular rhythm.     Pulses: Normal pulses.     Heart sounds: Normal heart sounds. No murmur heard. Pulmonary:     Effort: Pulmonary effort is normal. No respiratory distress.     Breath sounds: Normal breath sounds. No wheezing or rales.  Musculoskeletal:        General: Normal range of motion.     Cervical back: Normal range of motion and neck supple.  Lymphadenopathy:     Cervical: No cervical adenopathy.  Skin:    General: Skin is warm and dry.     Findings: No erythema or rash.  Neurological:     Mental Status: He is alert and oriented to person, place, and time. Mental status is at baseline.  Psychiatric:        Behavior: Behavior normal.     Comments:  Well groomed, good eye contact, normal speech and thoughts     Results for orders placed or performed in visit on 01/26/23  TSH   Collection Time: 01/26/23  4:54 PM  Result Value Ref Range  TSH 1.040 0.450 - 4.500 uIU/mL  CBC   Collection Time: 01/26/23  4:54 PM  Result Value Ref Range   WBC 8.4 3.4 - 10.8 x10E3/uL   RBC 4.28 4.14 - 5.80 x10E6/uL   Hemoglobin 13.3 13.0 - 17.7 g/dL   Hematocrit 82.9 56.2 - 51.0 %   MCV 97 79 - 97 fL   MCH 31.1 26.6 - 33.0 pg   MCHC 32.2 31.5 - 35.7 g/dL   RDW 13.0 86.5 - 78.4 %   Platelets 243 150 - 450 x10E3/uL  Basic metabolic panel   Collection Time: 01/26/23  4:54 PM  Result Value Ref Range   Glucose 99 70 - 99 mg/dL   BUN 15 8 - 27 mg/dL   Creatinine, Ser 6.96 0.76 - 1.27 mg/dL   eGFR 80 >29 BM/WUX/3.24   BUN/Creatinine Ratio 16 10 - 24   Sodium 141 134 - 144 mmol/L   Potassium 5.0 3.5 - 5.2 mmol/L   Chloride 100 96 - 106 mmol/L   CO2 24 20 - 29 mmol/L   Calcium 9.4 8.6 - 10.2 mg/dL      Assessment & Plan:   Problem List Items Addressed This Visit     Benign essential HTN   Coronary artery disease involving native coronary artery of native heart with angina pectoris (HCC) - Primary   History of cerebrovascular accident (CVA) with residual deficit   OSA on CPAP   Pre-diabetes   Recurrent major depressive disorder, in partial remission (HCC)   Systolic CHF (HCC)   Other Visit Diagnoses       Scrotal mass       Relevant Orders   Ambulatory referral to Urology     Urinary frequency       Relevant Orders   Ambulatory referral to Urology     Erectile dysfunction, unspecified erectile dysfunction type       Relevant Orders   Ambulatory referral to Urology        Scrotal Lump Non-painful scrotal lump with normal PSA levels. Family history of prostate cancer. Prefers urologist consultation. Offered external genital exam and scortal eval, however he declines today prefers to discuss with Urology - Refer to urologist for  evaluation. - Provide urologist contact information.  Systolic CHF Fluid Management with Furosemide On furosemide with occasional skipped evening dose to avoid nocturia. No significant edema, weight stable. - Continue furosemide as prescribed, second dose as needed. - Monitor for fluid retention.  Medication Timing Adjustment Advised to adjust levothyroxine intake for improved compliance. - Adjust levothyroxine intake to 30 minutes before other medications.  Routine Follow-up Routine follow-up with no major issues. Compliant with medication management. - Continue current medication regimen. - Monitor medication refills.  General Health Maintenance Up to date on vaccinations.  Follow-up Plan to monitor blood sugar and thyroid function. Heart test scheduled. - Schedule follow-up in three months for lab work including A1c and thyroid function tests. - Ensure attendance at heart test on April 10th.         Orders Placed This Encounter  Procedures   Ambulatory referral to Urology    Referral Priority:   Routine    Referral Type:   Consultation    Referral Reason:   Specialty Services Required    Requested Specialty:   Urology    Number of Visits Requested:   1    No orders of the defined types were placed in this encounter.   Follow up plan: No follow-ups on file.  Future labs ordered for 06/28/23 lab + follow-up 3 months, A1c, Thyroid   Saralyn Pilar, DO [facility] 03/25/2023, 1:26 PM

## 2023-03-25 NOTE — Patient Instructions (Addendum)
 Thank you for coming to the office today.  Remember Levothyroxine can wait 30 min then take other meds, not 1 hour.  Referral to [facility] -1st floor [address] [place],  [place]  [address] Phone: [phone]  Recent Labs    12/15/22 1024  HGBA1C 6.1*    Please schedule a Follow-up Appointment to: No follow-ups on file.  If you have any other questions or concerns, please feel free to call the office or send a message through MyChart. You may also schedule an earlier appointment if necessary.  Additionally, you may be receiving a survey about your experience at our office within a few days to 1 week by e-mail or mail. We value your feedback.  Saralyn Pilar, DO [facility] [place], [place]

## 2023-03-26 ENCOUNTER — Other Ambulatory Visit: Payer: Self-pay | Admitting: Family Medicine

## 2023-03-26 DIAGNOSIS — R351 Nocturia: Secondary | ICD-10-CM

## 2023-03-26 DIAGNOSIS — R7303 Prediabetes: Secondary | ICD-10-CM

## 2023-03-26 DIAGNOSIS — R350 Frequency of micturition: Secondary | ICD-10-CM

## 2023-03-26 DIAGNOSIS — I6930 Unspecified sequelae of cerebral infarction: Secondary | ICD-10-CM

## 2023-03-26 DIAGNOSIS — I5022 Chronic systolic (congestive) heart failure: Secondary | ICD-10-CM

## 2023-03-26 DIAGNOSIS — I25119 Atherosclerotic heart disease of native coronary artery with unspecified angina pectoris: Secondary | ICD-10-CM

## 2023-03-26 DIAGNOSIS — E039 Hypothyroidism, unspecified: Secondary | ICD-10-CM

## 2023-03-26 DIAGNOSIS — I10 Essential (primary) hypertension: Secondary | ICD-10-CM

## 2023-03-29 ENCOUNTER — Other Ambulatory Visit: Payer: Self-pay

## 2023-03-29 NOTE — Patient Outreach (Unsigned)
 Care Management   Visit Note  03/29/2023 Name: Luke Mckee MRN: [id number] DOB: [birth year]  Subjective: Luke Mckee is a 83 y.o. year old male who is a primary care patient of [name], DO. Engaged with Mr. Knust and his spouse/caregiver [name] via telephone today.  Assessment:  Review of patient past medical history, allergies, medications, health status, including review of consultants reports, laboratory and other test data, was performed as part of  evaluation and provision of care management services.   Outpatient Encounter Medications as of 03/29/2023  Medication Sig Note   acetaminophen (TYLENOL) 650 MG CR tablet Take 1,300 mg by mouth at bedtime as needed.    albuterol (VENTOLIN HFA) 108 (90 Base) MCG/ACT inhaler Inhale 1-2 puffs into the lungs every 4 (four) hours as needed for wheezing or shortness of breath (cough). 12/28/2022: Reports not using frequently   aspirin EC 81 MG tablet Take 81 mg by mouth daily.    atenolol (TENORMIN) 25 MG tablet Take 1 tablet (25 mg total) by mouth daily. 02/16/2023: Reports taking in the morning as advised by Cardiology team   atorvastatin (LIPITOR) 80 MG tablet Take 1 tablet (80 mg total) by mouth daily.    Calcium Carb-Cholecalciferol (CALCIUM 600 + D PO) Take 1 tablet by mouth daily.    Cholecalciferol (VITAMIN D) 50 MCG (2000 UT) tablet Take 2,000 Units by mouth daily.    clopidogrel (PLAVIX) 75 MG tablet Take 1 tablet (75 mg total) by mouth daily.    clotrimazole-betamethasone (LOTRISONE) cream Apply twice a day for 1-2 weeks, may repeat if need in future    Coenzyme Q10 100 MG TABS Take 100 mg by mouth daily.    colchicine 0.6 MG tablet For acute gout flare take 2 pills at once, then take a 3rd pill 2 hours later. Then take 1 pill daily for 7-10 days or until resolved. (Patient taking differently: Take 0.6 mg by mouth daily as needed. For acute gout flare take 2 pills at once, then take a 3rd pill 2 hours later. Then take 1 pill  daily for 7-10 days or until resolved.) 03/29/2023: Reports no recent acute flares   diclofenac sodium (VOLTAREN) 1 % GEL Apply 2 g topically 4 (four) times daily as needed for pain. Foot pain    escitalopram (LEXAPRO) 20 MG tablet Take 1 tablet (20 mg total) by mouth daily.    furosemide (LASIX) 80 MG tablet TAKE 1 TABLET(80 MG) BY MOUTH TWICE DAILY (Patient taking differently: Take 80 mg by mouth daily.) 12/28/2022: Reports taking differently. Reports he can take a second tab prn for swelling   ipratropium (ATROVENT) 0.06 % nasal spray Place 2 sprays into both nostrils 4 (four) times daily as needed for rhinitis.    levothyroxine (SYNTHROID) 125 MCG tablet TAKE 1 TABLET(125 MCG) BY MOUTH DAILY BEFORE BREAKFAST    loratadine (CLARITIN) 10 MG tablet Take 10 mg by mouth daily as needed.    LORazepam (ATIVAN) 0.5 MG tablet Take 1 tablet (0.5 mg total) by mouth daily as needed for anxiety or sleep. 03/29/2023: Reports using infrequently   meclizine (ANTIVERT) 12.5 MG tablet Take 1 tablet (12.5 mg total) by mouth 3 (three) times daily as needed for dizziness. 03/29/2023: Reports using infrequently   montelukast (SINGULAIR) 10 MG tablet Take 1 tablet (10 mg total) by mouth at bedtime.    Multiple Vitamin (MULTIVITAMIN) tablet Take 1 tablet by mouth daily.    nitroGLYCERIN (NITROSTAT) 0.4 MG SL tablet Place 1 tablet (0.4 mg  total) under the tongue every 5 (five) minutes as needed for chest pain. 02/16/2023: Reports not requiring use   PATADAY 0.1 % ophthalmic solution Place into both eyes as needed.     sennosides-docusate sodium (SENOKOT-S) 8.6-50 MG tablet Take 1-2 tablets by mouth daily as needed for constipation.    traZODone (DESYREL) 50 MG tablet Take 1-2 tablets (50-100 mg total) by mouth at bedtime. (Patient taking differently: Take 50 mg by mouth at bedtime.) 03/29/2023: Reports currently using 50mg  as needed   No facility-administered encounter medications on file as of 03/29/2023.      Interventions: Interventions Today    Flowsheet Row Most Recent Value  Chronic Disease   Chronic disease during today's visit Congestive Heart Failure (CHF), Hypertension (HTN), Atrial Fibrillation (AFib), Other  [Hyperlipidemia]  General Interventions   General Interventions Discussed/Reviewed General Interventions Discussed, Labs, Lipid Profile, Durable Medical Equipment (DME), Doctor Visits, Communication with  Labs Kidney Function  Doctor Visits Discussed/Reviewed Doctor Visits Reviewed, PCP, Specialist  Durable Medical Equipment (DME) Other  [CPAP]  PCP/Specialist Visits Compliance with follow-up visit  [Pending stress test on 04/21/23.]  Communication with Pharmacists  [Communicated with Pharmacist regarding request to transition to compliance packaging. Patient is currently scheduled for outreach with pharmacist on 05/09/23. Aware that he may receive a call sooner to discuss options]  Exercise Interventions   Exercise Discussed/Reviewed Physical Activity  Physical Activity Discussed/Reviewed Physical Activity Reviewed  Education Interventions   Education Provided Provided Education  Provided Verbal Education On Nutrition, Labs, Medication, When to see the doctor, Other  [Reviewed instructions related to preprocedure requirements for pending stress test.]  Labs Reviewed Kidney Function, Lipid Profile  Nutrition Interventions   Nutrition Discussed/Reviewed Nutrition Reviewed, Carbohydrate meal planning, Adding fruits and vegetables, Fluid intake, Portion sizes, Decreasing sugar intake, Increasing proteins, Decreasing fats, Decreasing salt  Pharmacy Interventions   Pharmacy Dicussed/Reviewed Pharmacy Topics Reviewed, Medications and their functions, Medication Adherence  [Message relayed to clinic pharmacist regarding request to transition to compliance packaging]  Safety Interventions   Safety Discussed/Reviewed Safety Reviewed

## 2023-03-29 NOTE — Patient Instructions (Signed)
 Thank you for allowing the Care Management team to participate in your care. It was great speaking with you today!   We will follow up as requested on April 19, 2023 to review instructions prior to your stress test. Please do not hesitate to contact me if you require assistance prior to our next outreach.     Juanell Fairly [facility] RN Care Manager Direct Dial: [phone]  Fax: [phone] Website: [email or web address]

## 2023-03-30 ENCOUNTER — Other Ambulatory Visit: Payer: Self-pay | Admitting: Pharmacist

## 2023-03-30 NOTE — Progress Notes (Unsigned)
   03/30/2023  Patient ID: Luke Mckee, male   DOB: [birth year], 83 y.o.   MRN: [id number]  Receive a message from Luke Mckee requesting call to patient and spouse to follow up regarding option of pill packaging.  Note have reviewed with patient/spouse local pharmacies with adherence packaging options.  Note patient has both [facility] Mc[facility] and [facility] prescription coverage. Per review of pharmacy list from Luke Mckee, appears that [facility] is in-network  Today patient's wife advises that she and patient would again like to consider pill packaging, but first want to check with [facility] to confirm costs both for pill pack and delivery. Provide spouse with phone number for [facility] [phone]) again. States that she will follow up with Luke Mckee at [facility] before deciding whether to proceed with this change.  Luke Mckee, PharmD, [facility] [phone]

## 2023-03-31 ENCOUNTER — Ambulatory Visit: Payer: Medicare PPO | Admitting: Physical Therapy

## 2023-04-07 ENCOUNTER — Ambulatory Visit: Payer: Medicare PPO | Admitting: Physical Therapy

## 2023-04-14 ENCOUNTER — Ambulatory Visit: Payer: Medicare PPO | Admitting: Physical Therapy

## 2023-04-19 ENCOUNTER — Other Ambulatory Visit: Payer: Self-pay

## 2023-04-19 ENCOUNTER — Encounter (HOSPITAL_COMMUNITY): Payer: Self-pay

## 2023-04-19 NOTE — Patient Instructions (Signed)
Thank you for allowing the Care Management team to participate in your care.

## 2023-04-19 NOTE — Patient Outreach (Signed)
 Complex Care Management   Visit Note  04/19/2023  Name:  Luke Mckee MRN: [id number] DOB: [birth year]  Situation: Referral received for Complex Care Management related to {Criteria:32550} I obtained verbal consent from ***.  Visit completed with ***  {VISIT LOCATION:32553}  Background:   Past Medical History:  Diagnosis Date   Anxiety    Arthritis    CAD (coronary artery disease) 2005   s/p CABG, DES   CHF (congestive heart failure) (HCC)    in EPIC care everywhere   Chronic kidney disease    COPD (chronic obstructive pulmonary disease) (HCC)    in EPIC care everywhere   Depression    Heart murmur    History of stroke    Hypothyroidism    OSA (obstructive sleep apnea)    Paroxysmal A-fib (HCC)    in EPIC careeverywhere    Assessment: Patient Reported Symptoms:  Cognitive    Neurological      HEENT      Cardiovascular      Respiratory      Endocrine      Gastrointestinal      Genitourinary      Integumentary      Musculoskeletal      Psychosocial       Vitals:    Medications Reviewed Today     Reviewed by Juanell Fairly, RN (Registered Nurse) on 04/19/23 at 1732  Med List Status: <None>   Medication Order Taking? Sig Documenting Provider Last Dose Status Informant  acetaminophen (TYLENOL) 650 MG CR tablet [id number] No Take 1,300 mg by mouth at bedtime as needed. [provider] Taking Active Spouse/Significant Other  albuterol (VENTOLIN HFA) 108 (90 Base) MCG/ACT inhaler [id number] No Inhale 1-2 puffs into the lungs every 4 (four) hours as needed for wheezing or shortness of breath (cough). [name], DO Taking Active            Med Note Juanell Fairly N   Tue Dec 28, 2022  2:17 PM) Reports not using frequently  aspirin EC 81 MG tablet [id number] No Take 81 mg by mouth daily. [provider] Taking Active   atenolol (TENORMIN) 25 MG tablet [id number] No Take 1 tablet (25 mg total) by mouth daily. [name], DO Taking Active            Med Note Constance Haw, Manas Hickling N   Wed Feb 16, 2023 11:27 AM) Reports taking in the morning as advised by Cardiology team  atorvastatin (LIPITOR) 80 MG tablet [id number] No Take 1 tablet (80 mg total) by mouth daily. [name], DO Taking Active   Calcium Carb-Cholecalciferol (CALCIUM 600 + D PO) [id number] No Take 1 tablet by mouth daily. [provider] Taking Active   Cholecalciferol (VITAMIN D) 50 MCG (2000 UT) tablet [id number] No Take 2,000 Units by mouth daily. [provider] Taking Active Spouse/Significant Other  clopidogrel (PLAVIX) 75 MG tablet [id number] No Take 1 tablet (75 mg total) by mouth daily. [name], DO Taking Active   clotrimazole-betamethasone (LOTRISONE) cream [id number] No Apply twice a day for 1-2 weeks, may repeat if need in future [name], DO Taking Active   Coenzyme Q10 100 MG TABS [id number] No Take 100 mg by mouth daily. [provider] Taking Active   colchicine 0.6 MG tablet [id number] No For acute gout flare take 2 pills at once, then take a 3rd pill 2 hours later. Then take 1 pill daily for 7-10 days or until resolved.  Patient taking differently: Take 0.6 mg by mouth daily as needed. For acute gout flare take 2 pills at once, then take a 3rd pill 2 hours later. Then take 1 pill daily for 7-10 days or until resolved.   [name], DO Taking Active            Med Note Constance Haw, Tyshaun Vinzant N   Tue Mar 29, 2023 11:52 AM) Reports no recent acute flares  diclofenac sodium (VOLTAREN) 1 % GEL [id number] No Apply 2 g topically 4 (four) times daily as needed for pain. Foot pain [provider] Taking Active Spouse/Significant Other  escitalopram (LEXAPRO) 20 MG tablet [id number] No Take 1 tablet (20 mg total) by mouth daily. [name], DO Taking Active   furosemide (LASIX) 80 MG tablet [id number] No TAKE 1 TABLET(80 MG) BY MOUTH TWICE DAILY  Patient  taking differently: Take 80 mg by mouth daily.   [name], DO Taking Active            Med Note Iline Oven   Tue Dec 28, 2022  2:16 PM) Reports taking differently. Reports he can take a second tab prn for swelling  ipratropium (ATROVENT) 0.06 % nasal spray [id number] No Place 2 sprays into both nostrils 4 (four) times daily as needed for rhinitis. [name], DO Taking Active   levothyroxine (SYNTHROID) 125 MCG tablet [id number] No TAKE 1 TABLET(125 MCG) BY MOUTH DAILY BEFORE BREAKFAST Karamalegos, Netta [name], DO Taking Active   loratadine (CLARITIN) 10 MG tablet [id number] No Take 10 mg by mouth daily as needed. [provider] Taking Active   LORazepam (ATIVAN) 0.5 MG tablet [id number] No Take 1 tablet (0.5 mg total) by mouth daily as needed for anxiety or sleep. [name], DO Taking Active            Med Note [facility], Norwood Quezada N   Tue Mar 29, 2023 11:55 AM) Reports using infrequently  meclizine (ANTIVERT) 12.5 MG tablet [id number] No Take 1 tablet (12.5 mg total) by mouth 3 (three) times daily as needed for dizziness. [name], DO Taking Active            Med Note [facility], Jaymere Alen N   Tue Mar 29, 2023 11:55 AM) Reports using infrequently  montelukast (SINGULAIR) 10 MG tablet [id number] No Take 1 tablet (10 mg total) by mouth at bedtime. [name], DO Taking Active   Multiple Vitamin (MULTIVITAMIN) tablet [id number] No Take 1 tablet by mouth daily. [provider] Taking Active Spouse/Significant Other  nitroGLYCERIN (NITROSTAT) 0.4 MG SL tablet [id number] No Place 1 tablet (0.4 mg total) under the tongue every 5 (five) minutes as needed for chest pain. [name], DO Taking Active            Med Note Constance Haw, Ramiro Pangilinan N   Wed Feb 16, 2023 11:18 AM) Reports not requiring use  PATADAY 0.1 % ophthalmic solution [id number] No Place into both eyes as needed.  [provider] Taking Active    sennosides-docusate sodium (SENOKOT-S) 8.6-50 MG tablet [id number] No Take 1-2 tablets by mouth daily as needed for constipation. [provider] Taking Active   traZODone (DESYREL) 50 MG tablet [id number] No Take 1-2 tablets (50-100 mg total) by mouth at bedtime.  Patient taking differently: Take 50 mg by mouth at bedtime.   Mecum, Oswaldo Conroy, PA-C Taking Active            Med Note (Ilsa Bonello N  Tue Mar 29, 2023 11:56 AM) Reports currently using 50mg  as needed            Recommendation:   {RECOMMENDATONS:32554}  Follow Up Plan:   {FOLLOWUP:32559}  SIG ***

## 2023-04-20 ENCOUNTER — Telehealth (HOSPITAL_COMMUNITY): Payer: Self-pay | Admitting: *Deleted

## 2023-04-20 NOTE — Telephone Encounter (Signed)
 Attempted to call patient regarding upcoming cardiac PET appointment. Left message on voicemail with name and callback number  Larey Brick RN Navigator Cardiac Imaging [facility] [phone] Office [phone] Cell  Reminder to avoid caffeine 12 hours prior to his appt.

## 2023-04-21 ENCOUNTER — Ambulatory Visit
Admission: RE | Admit: 2023-04-21 | Discharge: 2023-04-21 | Disposition: A | Discharge status: Home / Self Care | Payer: Medicare PPO | Source: Ambulatory Visit | Attending: Medical | Admitting: Medical

## 2023-04-21 ENCOUNTER — Ambulatory Visit: Payer: Medicare PPO | Admitting: Physical Therapy

## 2023-04-21 DIAGNOSIS — R0602 Shortness of breath: Secondary | ICD-10-CM

## 2023-04-21 DIAGNOSIS — I517 Cardiomegaly: Secondary | ICD-10-CM | POA: Diagnosis not present

## 2023-04-21 DIAGNOSIS — Z951 Presence of aortocoronary bypass graft: Secondary | ICD-10-CM | POA: Diagnosis not present

## 2023-04-21 DIAGNOSIS — K8020 Calculus of gallbladder without cholecystitis without obstruction: Secondary | ICD-10-CM | POA: Diagnosis not present

## 2023-04-21 DIAGNOSIS — J8410 Pulmonary fibrosis, unspecified: Secondary | ICD-10-CM | POA: Insufficient documentation

## 2023-04-21 DIAGNOSIS — I700 Atherosclerosis of aorta: Secondary | ICD-10-CM | POA: Insufficient documentation

## 2023-04-21 DIAGNOSIS — I2510 Atherosclerotic heart disease of native coronary artery without angina pectoris: Secondary | ICD-10-CM | POA: Diagnosis not present

## 2023-04-21 LAB — NM PET CT CARDIAC PERFUSION MULTI W/ABSOLUTE BLOODFLOW
LV dias vol: 107 mL (ref 62–150)
LV sys vol: 37 mL
Nuc Rest EF: 62 %
Nuc Stress EF: 65 %
Peak HR: 82 {beats}/min
Rest HR: 66 {beats}/min
Rest Nuclear Isotope Dose: 25 mCi
ST Depression (mm): 0 mm
Stress Nuclear Isotope Dose: 25 mCi
TID: 1.23

## 2023-04-21 MED ORDER — REGADENOSON 0.4 MG/5ML IV SOLN
INTRAVENOUS | Status: AC
  Filled 2023-04-21: qty 5

## 2023-04-21 MED ORDER — REGADENOSON 0.4 MG/5ML IV SOLN
0.4000 mg | Freq: Once | INTRAVENOUS | Status: AC
  Administered 2023-04-21: 0.4 mg via INTRAVENOUS
  Filled 2023-04-21: qty 5

## 2023-04-21 MED ORDER — RUBIDIUM RB82 GENERATOR (RUBYFILL)
25.0000 | PACK | Freq: Once | INTRAVENOUS | Status: AC
  Administered 2023-04-21: 25 via INTRAVENOUS

## 2023-04-21 MED ORDER — RUBIDIUM RB82 GENERATOR (RUBYFILL)
25.0000 | PACK | Freq: Once | INTRAVENOUS | Status: AC
  Administered 2023-04-21: 24.98 via INTRAVENOUS

## 2023-04-21 NOTE — Progress Notes (Signed)
 Patient presents for a cardiac PET stress test and tolerated procedure without incident. Patient maintained acceptable vital signs throughout the test and was offered caffeine after test.  Patient ambulated out of department with a steady gait.

## 2023-04-26 DIAGNOSIS — G4733 Obstructive sleep apnea (adult) (pediatric): Secondary | ICD-10-CM | POA: Diagnosis not present

## 2023-04-27 ENCOUNTER — Telehealth: Payer: Self-pay | Admitting: *Deleted

## 2023-04-27 NOTE — Telephone Encounter (Signed)
 Left a message for the patient to call back and MyChart message has been sent as well.

## 2023-04-27 NOTE — Telephone Encounter (Signed)
-----   Message from Cadence Rebekah Canada sent at 04/22/2023 12:47 PM EDT ----- Stress test as abnormal with possible area of blockage, overall low risk. Lets bring him in a little sooner to discuss possible cardiac cath, may need one if symptoms are persistent.

## 2023-04-28 ENCOUNTER — Ambulatory Visit: Payer: Medicare PPO | Admitting: Physical Therapy

## 2023-05-02 NOTE — Telephone Encounter (Signed)
 Left a message for the patient to call back.

## 2023-05-03 NOTE — Telephone Encounter (Signed)
 Attempted to contact pt x 3. Left placed in the mail requesting pt contact our office regarding stress test results.

## 2023-05-03 NOTE — Telephone Encounter (Signed)
 [name] wife returning call.

## 2023-05-04 NOTE — Telephone Encounter (Signed)
 Left a message for the patient to call back.

## 2023-05-05 ENCOUNTER — Ambulatory Visit: Payer: Medicare PPO | Admitting: Physical Therapy

## 2023-05-09 ENCOUNTER — Ambulatory Visit: Payer: Self-pay

## 2023-05-09 ENCOUNTER — Other Ambulatory Visit (INDEPENDENT_AMBULATORY_CARE_PROVIDER_SITE_OTHER): Payer: Medicare PPO | Admitting: Pharmacist

## 2023-05-09 ENCOUNTER — Telehealth: Payer: Self-pay | Admitting: Pharmacist

## 2023-05-09 DIAGNOSIS — I25119 Atherosclerotic heart disease of native coronary artery with unspecified angina pectoris: Secondary | ICD-10-CM

## 2023-05-09 DIAGNOSIS — I10 Essential (primary) hypertension: Secondary | ICD-10-CM

## 2023-05-09 NOTE — Telephone Encounter (Signed)
 Pt's wife made aware of stress test along with Cadence's recommendations. Appointment scheduled for 05/17/23.  Pt made aware of ED precaution should any new symptoms develop or worsen.

## 2023-05-09 NOTE — Telephone Encounter (Signed)
   Outreach Note  05/09/2023 Name: Luke Mckee MRN: [id number] DOB: 03-10-1940  Referred by: Raina Bunting, DO  Was unable to reach patient via telephone today and have left HIPAA compliant voicemail asking patient to return my call.   Follow Up Plan: Will collaborate with Care Guide to outreach to schedule follow up with me  Arthur Lash, PharmD, Becky Bowels, CPP Clinical Pharmacist [facility] [phone]

## 2023-05-09 NOTE — Telephone Encounter (Signed)
 This has been routed to clinic due to message is for Pharmacy

## 2023-05-09 NOTE — Telephone Encounter (Signed)
 Patient called back to return Delles, Severa Daniels, RPH-CPP call, would like a callback please when available

## 2023-05-09 NOTE — Patient Instructions (Signed)
 Goals Addressed             This Visit's Progress    Pharmacy Goals       Check your blood pressure twice weekly, and any time you have concerning symptoms like headache, chest pain, dizziness, shortness of breath, or vision changes.   To appropriately check your blood pressure, make sure you do the following:  1) Avoid caffeine, exercise, or tobacco products for 30 minutes before checking. Empty your bladder. 2) Sit with your back supported in a flat-backed chair. Rest your arm on something flat (arm of the chair, table, etc). 3) Sit still with your feet flat on the floor, resting, for at least 5 minutes.  4) Check your blood pressure. Take 1-2 readings.  5) Write down these readings and bring with you to any provider appointments.  Bring your home blood pressure machine with you to a provider's office for accuracy comparison at least once a year.   Make sure you take your blood pressure medications before you come to any office visit, even if you were asked to fast for labs.   Our goal bad cholesterol, or LDL, is less than 70 . This is why it is important to continue taking your atorvastatin  Feel free to call me with any questions or concerns. I look forward to our next call!   Estelle Grumbles, PharmD, [facility] Clinical Pharmacist [facility] [phone]

## 2023-05-09 NOTE — Progress Notes (Signed)
 05/09/2023 Name: Luke Mckee MRN: [id number] DOB: 05/25/1940  Chief Complaint  Patient presents with   Medication Management    Luke Mckee is a 83 y.o. year old male who presented for a telephone visit.   They were referred to the pharmacist by their PCP for assistance in managing complex medication management and medication adherence     Subjective:   Care Team: Primary Care Provider: Raina Bunting, DO ; Next Scheduled Visit: 07/11/2023 Cardiologist: Devorah Fonder, MD; Next Schedule Visit: 06/08/2023 Neurologist: Dennard Fisher, PA Nurse Care Manager: Roxie Cord, RN; Next Schedule Appointment: 05/10/2023  Medication Access/Adherence  Current Pharmacy:  [facility] [id number] [facility], [place] - [address] AT [facility] OF [address] [place] [address] Phone: [phone] Fax: [phone]   Patient reports affordability concerns with their medications: No  Patient reports access/transportation concerns to their pharmacy: No  Patient reports adherence concerns with their medications:  No     Uses weekly pillbox to organize patient's medications   [name] wife manages patient's medications using weekly pillbox. Note previously discussed option to switch to pill packaging to aid with adherence/decrease burden, but at this time patient not sure if he wants to switch/will consider     Hypertension:   Current medications:  - atenolol  25 mg daily in morning - furosemide  80 mg - reports taking once daily, but up to twice daily as needed   Patient has an automated, upper arm home BP cuff Denies checking home blood pressure recently   Denies signs of hypotension, such as dizziness recently   Denies signs of swelling today   Current Physical Activity: continuing to do exercises from PT and staying active around the home   Objective:  Lab Results  Component Value Date   HGBA1C 6.1 (H) 12/15/2022    Lab  Results  Component Value Date   CREATININE 0.95 01/26/2023   BUN 15 01/26/2023   NA 141 01/26/2023   K 5.0 01/26/2023   CL 100 01/26/2023   CO2 24 01/26/2023    Lab Results  Component Value Date   CHOL 104 12/15/2022   HDL 39 (L) 12/15/2022   LDLCALC 50 12/15/2022   TRIG 74 12/15/2022   CHOLHDL 2.7 12/15/2022    Medications Reviewed Today     Reviewed by Ardis Becton, RPH-CPP (Pharmacist) on 05/09/23 at 1442  Med List Status: <None>   Medication Order Taking? Sig Documenting Provider Last Dose Status Informant  acetaminophen  (TYLENOL ) 650 MG CR tablet [id number]  Take 1,300 mg by mouth at bedtime as needed. [provider]  Active Spouse/Significant Other  albuterol  (VENTOLIN  HFA) 108 (90 Base) MCG/ACT inhaler [id number]  Inhale 1-2 puffs into the lungs every 4 (four) hours as needed for wheezing or shortness of breath (cough). Raina Bunting, DO  Active            Med Note Dessa Floss, [facility] N   Tue Dec 28, 2022  2:17 PM) Reports not using frequently  aspirin  EC 81 MG tablet [id number]  Take 81 mg by mouth daily. [provider]  Active   atenolol  (TENORMIN ) 25 MG tablet [id number] Yes Take 1 tablet (25 mg total) by mouth daily. Raina Bunting, DO Taking Active            Med Note Dessa Floss, FELECIA N   Wed Feb 16, 2023 11:27 AM) Reports taking in the morning as advised by  Cardiology team  atorvastatin  (LIPITOR ) 80 MG tablet [id number]  Take 1 tablet (80 mg total) by mouth daily. Raina Bunting, DO  Active   Calcium  Carb-Cholecalciferol (CALCIUM  600 + D PO) [id number]  Take 1 tablet by mouth daily. [provider]  Active   Cholecalciferol (VITAMIN D) 50 MCG (2000 UT) tablet [id number]  Take 2,000 Units by mouth daily. [provider]  Active Spouse/Significant Other  clopidogrel  (PLAVIX ) 75 MG tablet [id number]  Take 1 tablet (75 mg total) by mouth daily. Raina Bunting, DO  Active   clotrimazole -betamethasone   (LOTRISONE ) cream [id number]  Apply twice a day for 1-2 weeks, may repeat if need in future Raina Bunting, DO  Active   Coenzyme Q10 100 MG TABS [id number]  Take 100 mg by mouth daily. [provider]  Active   colchicine  0.6 MG tablet [id number]  For acute gout flare take 2 pills at once, then take a 3rd pill 2 hours later. Then take 1 pill daily for 7-10 days or until resolved.  Patient taking differently: Take 0.6 mg by mouth daily as needed. For acute gout flare take 2 pills at once, then take a 3rd pill 2 hours later. Then take 1 pill daily for 7-10 days or until resolved.   Raina Bunting, DO  Active            Med Note Dessa Floss, FELECIA N   Tue Mar 29, 2023 11:52 AM) Reports no recent acute flares  diclofenac sodium (VOLTAREN) 1 % GEL [id number]  Apply 2 g topically 4 (four) times daily as needed for pain. Foot pain [provider]  Active Spouse/Significant Other  escitalopram  (LEXAPRO ) 20 MG tablet [id number]  Take 1 tablet (20 mg total) by mouth daily. Raina Bunting, DO  Active   furosemide  (LASIX ) 80 MG tablet [id number] Yes TAKE 1 TABLET(80 MG) BY MOUTH TWICE DAILY  Patient taking differently: Take 80 mg by mouth daily.   Raina Bunting, DO Taking Active            Med Note Camille Cedars   Tue Dec 28, 2022  2:16 PM) Reports taking differently. Reports he can take a second tab prn for swelling  ipratropium (ATROVENT ) 0.06 % nasal spray [id number]  Place 2 sprays into both nostrils 4 (four) times daily as needed for rhinitis. Raina Bunting, DO  Active   levothyroxine  (SYNTHROID ) 125 MCG tablet [id number]  TAKE 1 TABLET(125 MCG) BY MOUTH DAILY BEFORE BREAKFAST Karamalegos, Kayleen Party, DO  Active   loratadine  (CLARITIN ) 10 MG tablet [id number]  Take 10 mg by mouth daily as needed. [provider]  Active   LORazepam  (ATIVAN ) 0.5 MG tablet [id number]  Take 1 tablet (0.5 mg total) by mouth daily as needed for anxiety or  sleep. Raina Bunting, DO  Active            Med Note [facility], FELECIA N   Tue Mar 29, 2023 11:55 AM) Reports using infrequently  meclizine  (ANTIVERT ) 12.5 MG tablet [id number]  Take 1 tablet (12.5 mg total) by mouth 3 (three) times daily as needed for dizziness. Raina Bunting, DO  Active            Med Note Dessa Floss, FELECIA N   Tue Mar 29, 2023 11:55 AM) Reports using infrequently  montelukast  (SINGULAIR ) 10 MG tablet [id number]  Take 1 tablet (10 mg total) by mouth at bedtime. Raina Bunting, DO  Active   Multiple Vitamin (MULTIVITAMIN)  tablet [id number]  Take 1 tablet by mouth daily. [provider]  Active Spouse/Significant Other  nitroGLYCERIN  (NITROSTAT ) 0.4 MG SL tablet [id number]  Place 1 tablet (0.4 mg total) under the tongue every 5 (five) minutes as needed for chest pain. Raina Bunting, DO  Active            Med Note Dessa Floss, FELECIA N   Wed Feb 16, 2023 11:18 AM) Reports not requiring use  PATADAY 0.1 % ophthalmic solution [id number]  Place into both eyes as needed.  [provider]  Active   sennosides-docusate sodium (SENOKOT-S) 8.6-50 MG tablet [id number]  Take 1-2 tablets by mouth daily as needed for constipation. [provider]  Active   traZODone  (DESYREL ) 50 MG tablet [id number]  Take 1-2 tablets (50-100 mg total) by mouth at bedtime.  Patient taking differently: Take 50 mg by mouth at bedtime.   Mecum, Pearla Bottom, PA-C  Active            Med Note [facility], FELECIA N   Tue Mar 29, 2023 11:56 AM) Reports currently using 50mg  as needed              Assessment/Plan:   Advise patient/spouse to return call to [facility] for stress test results  Hypertension: - Reviewed long term cardiovascular and renal outcomes of uncontrolled blood pressure - Remind patient to continue to take positional changes slowly - Recommend to monitor home blood pressure, keep log of results and have this record to review  at upcoming medical appointments. Patient to contact provider office sooner if needed for readings outside of established parameters or symptoms     Medication Management: - Have reviewed local pharmacies with adherence packaging options.  Note patient has both [employer] and [employer] prescription coverage. Per review of pharmacy list from [employer], appears that [facility] is in-network At this time, patient using weekly pillbox, not sure if wants to make switch to pill packaging If using weekly pillbox, encourage to fill just once weekly and encourage to use medication list from latest office visit to aid with adherence Again provide patient/spouse with contact information for Sam at [facility] ([phone]) if interested in considering in future     Follow Up Plan: Clinical Pharmacist will follow up with patient by telephone on 07/25/2023 at 11:30 AM     Arthur Lash, PharmD, Becky Bowels, CPP Clinical Pharmacist [facility] [phone]

## 2023-05-09 NOTE — Telephone Encounter (Signed)
 Pt returning call and requesting cb to [phone]

## 2023-05-10 ENCOUNTER — Other Ambulatory Visit: Payer: Self-pay

## 2023-05-10 NOTE — Patient Outreach (Unsigned)
 Complex Care Management   Visit Note  05/10/2023  Name:  Luke Mckee MRN: [id number] DOB: Dec 29, 1940  Situation: Referral received for Complex Care Management related to {Criteria:32550} I obtained verbal consent from {CHL AMB Patient/Caregiver:28184}.  Visit completed with ***  {VISIT LOCATION:32553}  Background:   Past Medical History:  Diagnosis Date   Anxiety    Arthritis    CAD (coronary artery disease) 2005   s/p CABG, DES   CHF (congestive heart failure) (HCC)    in EPIC care everywhere   Chronic kidney disease    COPD (chronic obstructive pulmonary disease) (HCC)    in EPIC care everywhere   Depression    Heart murmur    History of stroke    Hypothyroidism    OSA (obstructive sleep apnea)    Paroxysmal A-fib (HCC)    in EPIC careeverywhere    Assessment: Patient Reported Symptoms:  Cognitive        Neurological      HEENT        Cardiovascular      Respiratory      Endocrine      Gastrointestinal        Genitourinary      Integumentary      Musculoskeletal          Psychosocial              04/19/2023    4:50 PM  Depression screen PHQ 2/9  Decreased Interest 0  Down, Depressed, Hopeless 1  PHQ - 2 Score 1    There were no vitals filed for this visit.  Medications Reviewed Today     Reviewed by Roxie Cord, RN (Registered Nurse) on 05/10/23 at 1431  Med List Status: <None>   Medication Order Taking? Sig Documenting Provider Last Dose Status Informant  acetaminophen  (TYLENOL ) 650 MG CR tablet [id number] No Take 1,300 mg by mouth at bedtime as needed. [provider] Taking Active Spouse/Significant Other  albuterol  (VENTOLIN  HFA) 108 (90 Base) MCG/ACT inhaler [id number] No Inhale 1-2 puffs into the lungs every 4 (four) hours as needed for wheezing or shortness of breath (cough). Raina Bunting, DO Taking Active            Med Note Dessa Floss, [facility] N   Tue Dec 28, 2022  2:17 PM) Reports not using frequently   aspirin  EC 81 MG tablet [id number] No Take 81 mg by mouth daily. [provider] Taking Active   atenolol  (TENORMIN ) 25 MG tablet [id number] No Take 1 tablet (25 mg total) by mouth daily. Raina Bunting, DO Taking Active            Med Note Dessa Floss, Javius Sylla N   Wed Feb 16, 2023 11:27 AM) Reports taking in the morning as advised by Cardiology team  atorvastatin  (LIPITOR ) 80 MG tablet [id number] No Take 1 tablet (80 mg total) by mouth daily. Raina Bunting, DO Taking Active   Calcium  Carb-Cholecalciferol (CALCIUM  600 + D PO) [id number] No Take 1 tablet by mouth daily. [provider] Taking Active   Cholecalciferol (VITAMIN D) 50 MCG (2000 UT) tablet [id number] No Take 2,000 Units by mouth daily. [provider] Taking Active Spouse/Significant Other  clopidogrel  (PLAVIX ) 75 MG tablet [id number] No Take 1 tablet (75 mg total) by mouth daily. Raina Bunting, DO Taking Active   clotrimazole -betamethasone  (LOTRISONE ) cream [id number] No Apply twice a day for 1-2 weeks, may repeat if need in future Raina Bunting, DO Taking Active  Coenzyme Q10 100 MG TABS [id number] No Take 100 mg by mouth daily. [provider] Taking Active   colchicine  0.6 MG tablet [id number] No For acute gout flare take 2 pills at once, then take a 3rd pill 2 hours later. Then take 1 pill daily for 7-10 days or until resolved.  Patient taking differently: Take 0.6 mg by mouth daily as needed. For acute gout flare take 2 pills at once, then take a 3rd pill 2 hours later. Then take 1 pill daily for 7-10 days or until resolved.   Raina Bunting, DO Taking Active            Med Note Dessa Floss, Ixchel Duck N   Tue Mar 29, 2023 11:52 AM) Reports no recent acute flares  diclofenac sodium (VOLTAREN) 1 % GEL [id number] No Apply 2 g topically 4 (four) times daily as needed for pain. Foot pain [provider] Taking Active Spouse/Significant Other  escitalopram   (LEXAPRO ) 20 MG tablet [id number] No Take 1 tablet (20 mg total) by mouth daily. Raina Bunting, DO Taking Active   furosemide  (LASIX ) 80 MG tablet [id number] No TAKE 1 TABLET(80 MG) BY MOUTH TWICE DAILY  Patient taking differently: Take 80 mg by mouth daily.   Raina Bunting, DO Taking Active            Med Note Camille Cedars   Tue Dec 28, 2022  2:16 PM) Reports taking differently. Reports he can take a second tab prn for swelling  ipratropium (ATROVENT ) 0.06 % nasal spray [id number] No Place 2 sprays into both nostrils 4 (four) times daily as needed for rhinitis. Raina Bunting, DO Taking Active   levothyroxine  (SYNTHROID ) 125 MCG tablet [id number] No TAKE 1 TABLET(125 MCG) BY MOUTH DAILY BEFORE BREAKFAST Karamalegos, Kayleen Party, DO Taking Active   loratadine  (CLARITIN ) 10 MG tablet [id number] No Take 10 mg by mouth daily as needed. [provider] Taking Active   LORazepam  (ATIVAN ) 0.5 MG tablet [id number] No Take 1 tablet (0.5 mg total) by mouth daily as needed for anxiety or sleep. Raina Bunting, DO Taking Active            Med Note [facility], Jaelon Gatley N   Tue Mar 29, 2023 11:55 AM) Reports using infrequently  meclizine  (ANTIVERT ) 12.5 MG tablet [id number] No Take 1 tablet (12.5 mg total) by mouth 3 (three) times daily as needed for dizziness. Raina Bunting, DO Taking Active            Med Note [facility], Lux Meaders N   Tue Mar 29, 2023 11:55 AM) Reports using infrequently  montelukast  (SINGULAIR ) 10 MG tablet [id number] No Take 1 tablet (10 mg total) by mouth at bedtime. Raina Bunting, DO Taking Active   Multiple Vitamin (MULTIVITAMIN) tablet [id number] No Take 1 tablet by mouth daily. [provider] Taking Active Spouse/Significant Other  nitroGLYCERIN  (NITROSTAT ) 0.4 MG SL tablet [id number] No Place 1 tablet (0.4 mg total) under the tongue every 5 (five) minutes as needed for chest pain. Raina Bunting, DO Taking  Active            Med Note Dessa Floss, Starletta Houchin N   Wed Feb 16, 2023 11:18 AM) Reports not requiring use  PATADAY 0.1 % ophthalmic solution [id number] No Place into both eyes as needed.  [provider] Taking Active   sennosides-docusate sodium (SENOKOT-S) 8.6-50 MG tablet [id number] No Take 1-2 tablets by mouth daily as needed for constipation. [provider] Taking Active  traZODone  (DESYREL ) 50 MG tablet [id number] No Take 1-2 tablets (50-100 mg total) by mouth at bedtime.  Patient taking differently: Take 50 mg by mouth at bedtime as needed.   Mecum, Erin E, PA-C Taking Active            Med Note Alla Isaacs, ELISABETH A   Mon May 09, 2023  2:42 PM)              Recommendation:   {RECOMMENDATONS:32554}  Follow Up Plan:   {FOLLOWUP:32559}  SIG ***

## 2023-05-10 NOTE — Patient Instructions (Signed)
 Thank you for allowing the Complex Care Management team to participate in your care.

## 2023-05-12 ENCOUNTER — Ambulatory Visit: Payer: Medicare PPO | Admitting: Physical Therapy

## 2023-05-17 ENCOUNTER — Ambulatory Visit: Attending: Medical | Admitting: Medical

## 2023-05-17 ENCOUNTER — Encounter: Payer: Self-pay | Admitting: Medical

## 2023-05-17 VITALS — BP 100/64 | HR 64 | Ht 68.0 in | Wt 217.0 lb

## 2023-05-17 DIAGNOSIS — I10 Essential (primary) hypertension: Secondary | ICD-10-CM

## 2023-05-17 DIAGNOSIS — I5032 Chronic diastolic (congestive) heart failure: Secondary | ICD-10-CM

## 2023-05-17 DIAGNOSIS — Z951 Presence of aortocoronary bypass graft: Secondary | ICD-10-CM

## 2023-05-17 DIAGNOSIS — I2510 Atherosclerotic heart disease of native coronary artery without angina pectoris: Secondary | ICD-10-CM | POA: Diagnosis not present

## 2023-05-17 DIAGNOSIS — R9439 Abnormal result of other cardiovascular function study: Secondary | ICD-10-CM

## 2023-05-17 DIAGNOSIS — R0602 Shortness of breath: Secondary | ICD-10-CM | POA: Diagnosis not present

## 2023-05-17 NOTE — Progress Notes (Signed)
 Cardiology Office Note:  .   Date:  05/17/2023  ID:  Luke Mckee, DOB Aug 20, 1940, MRN [id number] PCP: Raina Bunting, DO  [facility] Providers Cardiologist:  Belva Boyden, MD {  History of Present Illness: .   Luke Mckee is a 83 y.o. male with a h/o CAD s/p CABG x3 in [place]  2005 with subsequent stent placement, anxiety, OSA on CPAP, hypertension, history of stroke x2, status post loop monitor with no A-fib detected who presents for follow-up for echo and stress test.   Patient has a history of CAD with CABG x 3 in 2005. He underwent DES to OM SVG in 2018.  Patient reported another stent placement, however records unclear as to when or where.   Patient has a history of stroke x 2.  He has a loop recorder implanted with no evidence of A-fib so far. The look recorder is no longer transmitting, but still in place.   Patient was seen in the ER 07/29/2022 for pre-syncope and dizziness.  Blood pressure 155/114 pulse 65.  EKG showed normal sinus rhythm.  Labs were reassuring.  CT head and MRI brain were nonacute.  Patient was given meclizine , fluids and Atenolol  was cut in half. Patient was seen in follow-up in July 2024 blood pressure was low, orthostatics were negative.  Spironolactone  was stopped.  Patient was last seen in January 26, 2023 reporting intermittent shortness of breath.  Echo and stress test were ordered. Echo showed LVEF 55-60%, no WMA, G1DD, normal RVSF, mild MR. Cardiac PET stress showed mild ischemia in the inf wall. Study is probably low risk, sensitivity and specificity degraded by extracardiac activity.   Today, the patient reports persistent SOB, this is worse with exertion. He can walk to the mail box and back without much issue. He has occasional lower leg edema. He denies lightheadedness, heart racing. He repeorts occasional dizziness.   Studies Reviewed: Luke Mckee   EKG Interpretation Date/Time:  Tuesday May 17 2023 10:55:55 EDT Ventricular Rate:  64 PR  Interval:  188 QRS Duration:  94 QT Interval:  382 QTC Calculation: 394 R Axis:   16  Text Interpretation: Normal sinus rhythm Inferior infarct (cited on or before 29-Jul-2022) When compared with ECG of 26-Jan-2023 13:36, No significant change was found Confirmed by Luke Mckee, Luke Mckee ([id number]) on 05/17/2023 11:06:04 AM    Cardiac PET stress 04/2023    LV perfusion is abnormal. There is evidence of ischemia. Defect 1: There is a small defect with mild reduction in uptake present in the apical to mid inferior location(s) that is reversible. There is normal wall motion in the defect area. Consistent with ischemia.   Rest left ventricular function is normal. Rest EF: 62%. Stress left ventricular function is normal. Stress EF: 65%. End diastolic cavity size is normal.   Myocardial blood flow reserve is not reported in this patient due to technical or patient-specific concerns that affect accuracy.   Coronary calcium  assessment not performed due to prior revascularization.   Findings are consistent with mild ischemia in the inferior wall. The study is probably low risk; sensitivity and specificity of the study are degraded by significant extracardiac activity.   Electronically signed by: Luke Crisp, MD.  Echo 01/2023 1. Left ventricular ejection fraction, by estimation, is 55 to 60%. The  left ventricle has normal function. The left ventricle has no regional  wall motion abnormalities. Left ventricular diastolic parameters are  consistent with Grade I diastolic  dysfunction (impaired relaxation).   2. Right ventricular  systolic function is normal. The right ventricular  size is normal.   3. The mitral valve is normal in structure. Mild mitral valve  regurgitation.   4. The aortic valve is tricuspid. Aortic valve regurgitation is not  visualized.   5. The inferior vena cava is normal in size with greater than 50%  respiratory variability, suggesting right atrial pressure of 3 mmHg.        Physical Exam:   VS:  BP 100/64   Pulse 64   Ht 5\' 8"  (1.727 m)   Wt 217 lb (98.4 kg)   SpO2 96%   BMI 32.99 kg/m    Wt Readings from Last 3 Encounters:  05/17/23 217 lb (98.4 kg)  05/10/23 215 lb (97.5 kg)  04/19/23 215 lb (97.5 kg)    GEN: Well nourished, well developed in no acute distress NECK: No JVD; No carotid bruits CARDIAC: RRR, no murmurs, rubs, gallops RESPIRATORY:  Clear to auscultation without rales, wheezing or rhonchi  ABDOMEN: Soft, non-tender, non-distended EXTREMITIES:  No edema; No deformity   ASSESSMENT AND PLAN: .    SOB Abnormal stress test CAD s/p CABG Echo showed normal LVEF with G1DD with no significant valve disease. Cardiac PET was mildly abnormal with mild ischemia on the inferior wall, low risk. The patient still has severe SOB on exertion. He denies chest pain. Unsure if lungs are contributing, so I will refer to pulmonology (h/o OSA). EKG is non-acute. After a long discussion with patient and family, they would like to pursue a R/L heart cath. We will see him back after this. Continue ASA, Plavix , Atenolol , and Lipitor .   HFpEF He reports DOE as above. He appears euvolemic. Plan for R/L heart cath as above. Continue lasix  80mg  BID.   HTN BP is normal. Continue lasix  and atenolol     Informed Consent   Shared Decision Making/Informed Consent The risks [stroke (1 in 1000), death (1 in 1000), kidney failure [usually temporary] (1 in 500), bleeding (1 in 200), allergic reaction [possibly serious] (1 in 200)], benefits (diagnostic support and management of coronary artery disease) and alternatives of a cardiac catheterization were discussed in detail with Mr. Purifoy and he is willing to proceed.     Dispo: Follow-up in 3 weeks  Signed, Luke Mckee Luke Canada, PA-C

## 2023-05-17 NOTE — Patient Instructions (Signed)
 Medication Instructions:  Your physician recommends that you continue on your current medications as directed. Please refer to the Current Medication list given to you today.  *If you need a refill on your cardiac medications before your next appointment, please call your pharmacy*  Lab Work: Your provider would like for you to have following labs drawn today CBC, BMP.   If you have labs (blood work) drawn today and your tests are completely normal, you will receive your results only by: MyChart Message (if you have MyChart) OR A paper copy in the mail If you have any lab test that is abnormal or we need to change your treatment, we will call you to review the results.  Testing/Procedures:  [place] [facility] DEPT OF [facility] [place] [address] [place] [address] Dept: [phone] Loc: [phone]  Luke Mckee  05/17/2023  You are scheduled for a Cardiac Catheterization on Thursday, May 8 with Dr. Randene Bustard.  1. Please arrive at the [facility] Entrance of [facility], [address], [place] [address] at 12:00 PM (This is 1 (ONE ) hour(s) prior to your procedure time).  Proceed to the Check-In Desk directly inside the entrance.  Procedure Parking: Use the entrance off of the [address] side of the hospital. Turn right upon entering and follow the driveway to parking that is directly in front of the Heart [facility] Vascular Center. There is no valet parking available at this entrance, however there is an awning directly in front of the Heart & Vascular Center for drop off/ pick up for patients.  Special note: Every effort is made to have your procedure done on time. Please understand that emergencies sometimes delay scheduled procedures.  2. Diet: Do not eat solid foods after midnight.  The patient may have clear liquids until 5am upon the day of the procedure.   4. Medication  instructions in preparation for your procedure:   Contrast Allergy: No  HOLD LASIX   THE MORNING OF PROCEDURE.    On the morning of your procedure, take your Aspirin  81 mg and Plavix /Clopidogrel  and any morning medicines NOT listed above.  You may use sips of water.  5. Plan to go home the same day, you will only stay overnight if medically necessary. 6. Bring a current list of your medications and current insurance cards. 7. You MUST have a responsible person to drive you home. 8. Someone MUST be with you the first 24 hours after you arrive home or your discharge will be delayed. 9. Please wear clothes that are easy to get on and off and wear slip-on shoes.  Thank you for allowing us  to care for you!   -- Luke Mckee Invasive Cardiovascular services   Follow-Up: At [facility], you and your health needs are our priority.  As part of our continuing mission to provide you with exceptional heart care, our providers are all part of one team.  This team includes your primary Cardiologist (physician) and Advanced Practice Providers or APPs (Physician Assistants and Nurse Practitioners) who all work together to provide you with the care you need, when you need it.  Your next appointment:    2 week(s) AFTER HEART CATH   Provider:   You may see Timothy Gollan, MD or one of the following Advanced Practice Providers on your designated Care Team:   Laneta Pintos, NP Gildardo Labrador, PA-C Varney Gentleman, PA-C Cadence Gennaro Khat,  PA-C Ronald Cockayne, NP Morey Ar, NP

## 2023-05-17 NOTE — H&P (View-Only) (Signed)
 Cardiology Office Note:  .   Date:  05/17/2023  ID:  Luke Mckee, DOB Aug 20, 1940, MRN [id number] PCP: Raina Bunting, DO  [facility] Providers Cardiologist:  Belva Boyden, MD {  History of Present Illness: .   Luke Mckee is a 83 y.o. male with a h/o CAD s/p CABG x3 in [place]  2005 with subsequent stent placement, anxiety, OSA on CPAP, hypertension, history of stroke x2, status post loop monitor with no A-fib detected who presents for follow-up for echo and stress test.   Patient has a history of CAD with CABG x 3 in 2005. He underwent DES to OM SVG in 2018.  Patient reported another stent placement, however records unclear as to when or where.   Patient has a history of stroke x 2.  He has a loop recorder implanted with no evidence of A-fib so far. The look recorder is no longer transmitting, but still in place.   Patient was seen in the ER 07/29/2022 for pre-syncope and dizziness.  Blood pressure 155/114 pulse 65.  EKG showed normal sinus rhythm.  Labs were reassuring.  CT head and MRI brain were nonacute.  Patient was given meclizine , fluids and Atenolol  was cut in half. Patient was seen in follow-up in July 2024 blood pressure was low, orthostatics were negative.  Spironolactone  was stopped.  Patient was last seen in January 26, 2023 reporting intermittent shortness of breath.  Echo and stress test were ordered. Echo showed LVEF 55-60%, no WMA, G1DD, normal RVSF, mild MR. Cardiac PET stress showed mild ischemia in the inf wall. Study is probably low risk, sensitivity and specificity degraded by extracardiac activity.   Today, the patient reports persistent SOB, this is worse with exertion. He can walk to the mail box and back without much issue. He has occasional lower leg edema. He denies lightheadedness, heart racing. He repeorts occasional dizziness.   Studies Reviewed: Luke Mckee   EKG Interpretation Date/Time:  Tuesday May 17 2023 10:55:55 EDT Ventricular Rate:  64 PR  Interval:  188 QRS Duration:  94 QT Interval:  382 QTC Calculation: 394 R Axis:   16  Text Interpretation: Normal sinus rhythm Inferior infarct (cited on or before 29-Jul-2022) When compared with ECG of 26-Jan-2023 13:36, No significant change was found Confirmed by Gennaro Khat, Taylorann Tkach ([id number]) on 05/17/2023 11:06:04 AM    Cardiac PET stress 04/2023    LV perfusion is abnormal. There is evidence of ischemia. Defect 1: There is a small defect with mild reduction in uptake present in the apical to mid inferior location(s) that is reversible. There is normal wall motion in the defect area. Consistent with ischemia.   Rest left ventricular function is normal. Rest EF: 62%. Stress left ventricular function is normal. Stress EF: 65%. End diastolic cavity size is normal.   Myocardial blood flow reserve is not reported in this patient due to technical or patient-specific concerns that affect accuracy.   Coronary calcium  assessment not performed due to prior revascularization.   Findings are consistent with mild ischemia in the inferior wall. The study is probably low risk; sensitivity and specificity of the study are degraded by significant extracardiac activity.   Electronically signed by: Sammy Crisp, MD.  Echo 01/2023 1. Left ventricular ejection fraction, by estimation, is 55 to 60%. The  left ventricle has normal function. The left ventricle has no regional  wall motion abnormalities. Left ventricular diastolic parameters are  consistent with Grade I diastolic  dysfunction (impaired relaxation).   2. Right ventricular  systolic function is normal. The right ventricular  size is normal.   3. The mitral valve is normal in structure. Mild mitral valve  regurgitation.   4. The aortic valve is tricuspid. Aortic valve regurgitation is not  visualized.   5. The inferior vena cava is normal in size with greater than 50%  respiratory variability, suggesting right atrial pressure of 3 mmHg.        Physical Exam:   VS:  BP 100/64   Pulse 64   Ht 5\' 8"  (1.727 m)   Wt 217 lb (98.4 kg)   SpO2 96%   BMI 32.99 kg/m    Wt Readings from Last 3 Encounters:  05/17/23 217 lb (98.4 kg)  05/10/23 215 lb (97.5 kg)  04/19/23 215 lb (97.5 kg)    GEN: Well nourished, well developed in no acute distress NECK: No JVD; No carotid bruits CARDIAC: RRR, no murmurs, rubs, gallops RESPIRATORY:  Clear to auscultation without rales, wheezing or rhonchi  ABDOMEN: Soft, non-tender, non-distended EXTREMITIES:  No edema; No deformity   ASSESSMENT AND PLAN: .    SOB Abnormal stress test CAD s/p CABG Echo showed normal LVEF with G1DD with no significant valve disease. Cardiac PET was mildly abnormal with mild ischemia on the inferior wall, low risk. The patient still has severe SOB on exertion. He denies chest pain. Unsure if lungs are contributing, so I will refer to pulmonology (h/o OSA). EKG is non-acute. After a long discussion with patient and family, they would like to pursue a R/L heart cath. We will see him back after this. Continue ASA, Plavix , Atenolol , and Lipitor .   HFpEF He reports DOE as above. He appears euvolemic. Plan for R/L heart cath as above. Continue lasix  80mg  BID.   HTN BP is normal. Continue lasix  and atenolol     Informed Consent   Shared Decision Making/Informed Consent The risks [stroke (1 in 1000), death (1 in 1000), kidney failure [usually temporary] (1 in 500), bleeding (1 in 200), allergic reaction [possibly serious] (1 in 200)], benefits (diagnostic support and management of coronary artery disease) and alternatives of a cardiac catheterization were discussed in detail with Luke Mckee and he is willing to proceed.     Dispo: Follow-up in 3 weeks  Signed, Maudy Yonan Rebekah Canada, PA-C

## 2023-05-18 ENCOUNTER — Encounter: Payer: Self-pay | Admitting: Emergency Medicine

## 2023-05-18 LAB — CBC
Hematocrit: 41.6 % (ref 37.5–51.0)
Hemoglobin: 13.5 g/dL (ref 13.0–17.7)
MCH: 31.4 pg (ref 26.6–33.0)
MCHC: 32.5 g/dL (ref 31.5–35.7)
MCV: 97 fL (ref 79–97)
Platelets: 287 10*3/uL (ref 150–450)
RBC: 4.3 x10E6/uL (ref 4.14–5.80)
RDW: 13.1 % (ref 11.6–15.4)
WBC: 9.1 10*3/uL (ref 3.4–10.8)

## 2023-05-18 LAB — BASIC METABOLIC PANEL WITH GFR
BUN/Creatinine Ratio: 14 (ref 10–24)
BUN: 15 mg/dL (ref 8–27)
CO2: 27 mmol/L (ref 20–29)
Calcium: 9.9 mg/dL (ref 8.6–10.2)
Chloride: 99 mmol/L (ref 96–106)
Creatinine, Ser: 1.06 mg/dL (ref 0.76–1.27)
Glucose: 102 mg/dL — ABNORMAL HIGH (ref 70–99)
Potassium: 4.8 mmol/L (ref 3.5–5.2)
Sodium: 142 mmol/L (ref 134–144)
eGFR: 70 mL/min/{1.73_m2} (ref >59)

## 2023-05-19 ENCOUNTER — Encounter: Payer: Self-pay | Admitting: Cardiology

## 2023-05-19 ENCOUNTER — Ambulatory Visit: Payer: Medicare PPO | Admitting: Physical Therapy

## 2023-05-19 ENCOUNTER — Encounter
Admission: RE | Disposition: A | Discharge status: Home / Self Care | Payer: Self-pay | Source: Home / Self Care | Attending: Cardiovascular Disease

## 2023-05-19 ENCOUNTER — Ambulatory Visit
Admission: RE | Admit: 2023-05-19 | Discharge: 2023-05-20 | Disposition: A | Discharge status: Home / Self Care | Attending: Cardiovascular Disease | Admitting: Cardiovascular Disease

## 2023-05-19 ENCOUNTER — Other Ambulatory Visit: Payer: Self-pay

## 2023-05-19 DIAGNOSIS — Z7902 Long term (current) use of antithrombotics/antiplatelets: Secondary | ICD-10-CM | POA: Insufficient documentation

## 2023-05-19 DIAGNOSIS — I25118 Atherosclerotic heart disease of native coronary artery with other forms of angina pectoris: Secondary | ICD-10-CM | POA: Diagnosis not present

## 2023-05-19 DIAGNOSIS — I2582 Chronic total occlusion of coronary artery: Secondary | ICD-10-CM | POA: Diagnosis not present

## 2023-05-19 DIAGNOSIS — R9430 Abnormal result of cardiovascular function study, unspecified: Secondary | ICD-10-CM | POA: Insufficient documentation

## 2023-05-19 DIAGNOSIS — Z9861 Coronary angioplasty status: Secondary | ICD-10-CM

## 2023-05-19 DIAGNOSIS — I2510 Atherosclerotic heart disease of native coronary artery without angina pectoris: Secondary | ICD-10-CM

## 2023-05-19 DIAGNOSIS — I25708 Atherosclerosis of coronary artery bypass graft(s), unspecified, with other forms of angina pectoris: Secondary | ICD-10-CM

## 2023-05-19 DIAGNOSIS — T82858A Stenosis of vascular prosthetic devices, implants and grafts, initial encounter: Secondary | ICD-10-CM | POA: Insufficient documentation

## 2023-05-19 DIAGNOSIS — Z79899 Other long term (current) drug therapy: Secondary | ICD-10-CM | POA: Insufficient documentation

## 2023-05-19 DIAGNOSIS — Z951 Presence of aortocoronary bypass graft: Secondary | ICD-10-CM | POA: Diagnosis not present

## 2023-05-19 DIAGNOSIS — G4733 Obstructive sleep apnea (adult) (pediatric): Secondary | ICD-10-CM | POA: Insufficient documentation

## 2023-05-19 DIAGNOSIS — Z955 Presence of coronary angioplasty implant and graft: Secondary | ICD-10-CM | POA: Insufficient documentation

## 2023-05-19 DIAGNOSIS — Y832 Surgical operation with anastomosis, bypass or graft as the cause of abnormal reaction of the patient, or of later complication, without mention of misadventure at the time of the procedure: Secondary | ICD-10-CM | POA: Diagnosis not present

## 2023-05-19 DIAGNOSIS — E039 Hypothyroidism, unspecified: Secondary | ICD-10-CM | POA: Diagnosis present

## 2023-05-19 DIAGNOSIS — I10 Essential (primary) hypertension: Secondary | ICD-10-CM | POA: Diagnosis present

## 2023-05-19 DIAGNOSIS — I110 Hypertensive heart disease with heart failure: Secondary | ICD-10-CM | POA: Diagnosis not present

## 2023-05-19 DIAGNOSIS — Z8673 Personal history of transient ischemic attack (TIA), and cerebral infarction without residual deficits: Secondary | ICD-10-CM | POA: Insufficient documentation

## 2023-05-19 DIAGNOSIS — I6930 Unspecified sequelae of cerebral infarction: Secondary | ICD-10-CM

## 2023-05-19 DIAGNOSIS — E66811 Obesity, class 1: Secondary | ICD-10-CM | POA: Diagnosis present

## 2023-05-19 DIAGNOSIS — R600 Localized edema: Secondary | ICD-10-CM | POA: Insufficient documentation

## 2023-05-19 DIAGNOSIS — R9439 Abnormal result of other cardiovascular function study: Secondary | ICD-10-CM | POA: Diagnosis present

## 2023-05-19 DIAGNOSIS — I25119 Atherosclerotic heart disease of native coronary artery with unspecified angina pectoris: Secondary | ICD-10-CM | POA: Diagnosis not present

## 2023-05-19 DIAGNOSIS — R0602 Shortness of breath: Secondary | ICD-10-CM | POA: Diagnosis not present

## 2023-05-19 HISTORY — PX: RIGHT/LEFT HEART CATH AND CORONARY ANGIOGRAPHY: CATH118266

## 2023-05-19 HISTORY — PX: CORONARY BALLOON ANGIOPLASTY: CATH118233

## 2023-05-19 LAB — POCT I-STAT EG7
Acid-Base Excess: 4 mmol/L — ABNORMAL HIGH (ref 0.0–2.0)
Acid-Base Excess: 4 mmol/L — ABNORMAL HIGH (ref 0.0–2.0)
Acid-Base Excess: 4 mmol/L — ABNORMAL HIGH (ref 0.0–2.0)
Bicarbonate: 29.7 mmol/L — ABNORMAL HIGH (ref 20.0–28.0)
Bicarbonate: 30 mmol/L — ABNORMAL HIGH (ref 20.0–28.0)
Bicarbonate: 30.3 mmol/L — ABNORMAL HIGH (ref 20.0–28.0)
Calcium, Ion: 1.19 mmol/L (ref 1.15–1.40)
Calcium, Ion: 1.22 mmol/L (ref 1.15–1.40)
Calcium, Ion: 1.23 mmol/L (ref 1.15–1.40)
HCT: 34 % — ABNORMAL LOW (ref 39.0–52.0)
HCT: 34 % — ABNORMAL LOW (ref 39.0–52.0)
HCT: 35 % — ABNORMAL LOW (ref 39.0–52.0)
Hemoglobin: 11.6 g/dL — ABNORMAL LOW (ref 13.0–17.0)
Hemoglobin: 11.6 g/dL — ABNORMAL LOW (ref 13.0–17.0)
Hemoglobin: 11.9 g/dL — ABNORMAL LOW (ref 13.0–17.0)
O2 Saturation: 54 %
O2 Saturation: 57 %
O2 Saturation: 60 %
Potassium: 3.6 mmol/L (ref 3.5–5.1)
Potassium: 3.7 mmol/L (ref 3.5–5.1)
Potassium: 3.7 mmol/L (ref 3.5–5.1)
Sodium: 139 mmol/L (ref 135–145)
Sodium: 139 mmol/L (ref 135–145)
Sodium: 141 mmol/L (ref 135–145)
TCO2: 31 mmol/L (ref 22–32)
TCO2: 32 mmol/L (ref 22–32)
TCO2: 32 mmol/L (ref 22–32)
pCO2, Ven: 50 mmHg (ref 44–60)
pCO2, Ven: 50.1 mmHg (ref 44–60)
pCO2, Ven: 51.5 mmHg (ref 44–60)
pH, Ven: 7.378 (ref 7.25–7.43)
pH, Ven: 7.381 (ref 7.25–7.43)
pH, Ven: 7.385 (ref 7.25–7.43)
pO2, Ven: 30 mmHg — CL (ref 32–45)
pO2, Ven: 31 mmHg — CL (ref 32–45)
pO2, Ven: 32 mmHg (ref 32–45)

## 2023-05-19 LAB — POCT I-STAT 7, (LYTES, BLD GAS, ICA,H+H)
Acid-Base Excess: 4 mmol/L — ABNORMAL HIGH (ref 0.0–2.0)
Acid-base deficit: 2 mmol/L (ref 0.0–2.0)
Bicarbonate: 28.9 mmol/L — ABNORMAL HIGH (ref 20.0–28.0)
Bicarbonate: 24 mmol/L (ref 20.0–28.0)
Calcium, Ion: 1.21 mmol/L (ref 1.15–1.40)
Calcium, Ion: 1.14 mmol/L — ABNORMAL LOW (ref 1.15–1.40)
HCT: 34 % — ABNORMAL LOW (ref 39.0–52.0)
HCT: 33 % — ABNORMAL LOW (ref 39.0–52.0)
Hemoglobin: 11.6 g/dL — ABNORMAL LOW (ref 13.0–17.0)
Hemoglobin: 11.2 g/dL — ABNORMAL LOW (ref 13.0–17.0)
O2 Saturation: 87 %
O2 Saturation: 89 %
Potassium: 3.6 mmol/L (ref 3.5–5.1)
Potassium: 3.3 mmol/L — ABNORMAL LOW (ref 3.5–5.1)
Sodium: 141 mmol/L (ref 135–145)
Sodium: 127 mmol/L — ABNORMAL LOW (ref 135–145)
TCO2: 30 mmol/L (ref 22–32)
TCO2: 25 mmol/L (ref 22–32)
pCO2 arterial: 46.1 mmHg (ref 32–48)
pCO2 arterial: 43.7 mmHg (ref 32–48)
pH, Arterial: 7.405 (ref 7.35–7.45)
pH, Arterial: 7.348 — ABNORMAL LOW (ref 7.35–7.45)
pO2, Arterial: 53 mmHg — ABNORMAL LOW (ref 83–108)
pO2, Arterial: 60 mmHg — ABNORMAL LOW (ref 83–108)

## 2023-05-19 LAB — POCT ACTIVATED CLOTTING TIME
Activated Clotting Time: 256 s
Activated Clotting Time: 291 s

## 2023-05-19 SURGERY — RIGHT/LEFT HEART CATH AND CORONARY ANGIOGRAPHY
Anesthesia: Moderate Sedation

## 2023-05-19 MED ORDER — CLOPIDOGREL BISULFATE 75 MG PO TABS
75.0000 mg | ORAL_TABLET | Freq: Every day | ORAL | Status: DC
  Administered 2023-05-20: 75 mg via ORAL
  Filled 2023-05-19: qty 1

## 2023-05-19 MED ORDER — CLOPIDOGREL BISULFATE 75 MG PO TABS
ORAL_TABLET | ORAL | Status: AC
  Filled 2023-05-19: qty 4

## 2023-05-19 MED ORDER — FENTANYL CITRATE (PF) 100 MCG/2ML IJ SOLN
INTRAMUSCULAR | Status: AC
  Filled 2023-05-19: qty 2

## 2023-05-19 MED ORDER — LIDOCAINE HCL 1 % IJ SOLN
INTRAMUSCULAR | Status: AC
  Filled 2023-05-19: qty 20

## 2023-05-19 MED ORDER — LEVOTHYROXINE SODIUM 125 MCG PO TABS
125.0000 ug | ORAL_TABLET | Freq: Every day | ORAL | Status: DC
  Administered 2023-05-20: 125 ug via ORAL
  Filled 2023-05-19: qty 1

## 2023-05-19 MED ORDER — HEPARIN SODIUM (PORCINE) 1000 UNIT/ML IJ SOLN
INTRAMUSCULAR | Status: AC
  Filled 2023-05-19: qty 10

## 2023-05-19 MED ORDER — SODIUM CHLORIDE 0.9 % IV SOLN: INTRAVENOUS | Status: AC

## 2023-05-19 MED ORDER — LORATADINE 10 MG PO TABS
10.0000 mg | ORAL_TABLET | Freq: Every day | ORAL | Status: DC
  Administered 2023-05-20: 10 mg via ORAL
  Filled 2023-05-19: qty 1

## 2023-05-19 MED ORDER — VERAPAMIL HCL 2.5 MG/ML IV SOLN
INTRAVENOUS | Status: AC
  Filled 2023-05-19: qty 2

## 2023-05-19 MED ORDER — LABETALOL HCL 5 MG/ML IV SOLN: 10.0000 mg | INTRAVENOUS | Status: AC | PRN

## 2023-05-19 MED ORDER — HEPARIN (PORCINE) IN NACL 1000-0.9 UT/500ML-% IV SOLN
INTRAVENOUS | Status: AC
  Filled 2023-05-19: qty 1000

## 2023-05-19 MED ORDER — HEPARIN SODIUM (PORCINE) 1000 UNIT/ML IJ SOLN
INTRAMUSCULAR | Status: DC | PRN
  Administered 2023-05-19: 5000 [IU] via INTRAVENOUS
  Administered 2023-05-19: 2000 [IU] via INTRAVENOUS
  Administered 2023-05-19: 8000 [IU] via INTRAVENOUS

## 2023-05-19 MED ORDER — LORAZEPAM 0.5 MG PO TABS: 0.5000 mg | ORAL_TABLET | Freq: Every day | ORAL | Status: DC | PRN

## 2023-05-19 MED ORDER — ASPIRIN 81 MG PO TBEC
81.0000 mg | DELAYED_RELEASE_TABLET | Freq: Every day | ORAL | Status: DC
  Administered 2023-05-20: 81 mg via ORAL
  Filled 2023-05-19: qty 1

## 2023-05-19 MED ORDER — ONDANSETRON HCL 4 MG/2ML IJ SOLN: 4.0000 mg | Freq: Four times a day (QID) | INTRAMUSCULAR | Status: DC | PRN

## 2023-05-19 MED ORDER — ACETAMINOPHEN 325 MG PO TABS: 650.0000 mg | ORAL_TABLET | ORAL | Status: DC | PRN

## 2023-05-19 MED ORDER — TRAZODONE HCL 50 MG PO TABS
50.0000 mg | ORAL_TABLET | Freq: Every day | ORAL | Status: DC
  Administered 2023-05-19: 50 mg via ORAL
  Filled 2023-05-19: qty 1

## 2023-05-19 MED ORDER — SODIUM CHLORIDE 0.9 % WEIGHT BASED INFUSION
3.0000 mL/kg/h | INTRAVENOUS | Status: DC
  Administered 2023-05-19: 3 mL/kg/h via INTRAVENOUS

## 2023-05-19 MED ORDER — ASPIRIN 81 MG PO CHEW: 81.0000 mg | CHEWABLE_TABLET | ORAL | Status: DC

## 2023-05-19 MED ORDER — ATENOLOL 25 MG PO TABS
25.0000 mg | ORAL_TABLET | Freq: Every day | ORAL | Status: DC
Start: 2023-05-20 — End: 2023-05-20
  Administered 2023-05-20: 25 mg via ORAL
  Filled 2023-05-19: qty 1

## 2023-05-19 MED ORDER — IOHEXOL 300 MG/ML  SOLN
INTRAMUSCULAR | Status: DC | PRN
  Administered 2023-05-19: 200 mL

## 2023-05-19 MED ORDER — SODIUM CHLORIDE 0.9 % IV SOLN: 250.0000 mL | INTRAVENOUS | Status: DC | PRN

## 2023-05-19 MED ORDER — SENNOSIDES-DOCUSATE SODIUM 8.6-50 MG PO TABS
2.0000 | ORAL_TABLET | Freq: Every day | ORAL | Status: DC
  Administered 2023-05-19: 2 via ORAL
  Filled 2023-05-19: qty 2

## 2023-05-19 MED ORDER — MIDAZOLAM HCL 2 MG/2ML IJ SOLN
INTRAMUSCULAR | Status: DC | PRN
  Administered 2023-05-19: 1 mg via INTRAVENOUS

## 2023-05-19 MED ORDER — ESCITALOPRAM OXALATE 20 MG PO TABS
20.0000 mg | ORAL_TABLET | Freq: Every day | ORAL | Status: DC
  Administered 2023-05-19: 20 mg via ORAL
  Filled 2023-05-19: qty 1
  Filled 2023-05-19: qty 2

## 2023-05-19 MED ORDER — ATORVASTATIN CALCIUM 80 MG PO TABS
80.0000 mg | ORAL_TABLET | Freq: Every evening | ORAL | Status: DC
  Administered 2023-05-19: 80 mg via ORAL
  Filled 2023-05-19: qty 1

## 2023-05-19 MED ORDER — FUROSEMIDE 40 MG PO TABS
80.0000 mg | ORAL_TABLET | Freq: Two times a day (BID) | ORAL | Status: DC
  Administered 2023-05-20: 80 mg via ORAL
  Filled 2023-05-19: qty 2

## 2023-05-19 MED ORDER — CLOPIDOGREL BISULFATE 75 MG PO TABS
ORAL_TABLET | ORAL | Status: DC | PRN
  Administered 2023-05-19: 300 mg via ORAL

## 2023-05-19 MED ORDER — MONTELUKAST SODIUM 10 MG PO TABS
10.0000 mg | ORAL_TABLET | Freq: Every day | ORAL | Status: DC
  Administered 2023-05-19: 10 mg via ORAL
  Filled 2023-05-19: qty 1

## 2023-05-19 MED ORDER — NITROGLYCERIN 0.4 MG SL SUBL: 0.4000 mg | SUBLINGUAL_TABLET | SUBLINGUAL | Status: DC | PRN

## 2023-05-19 MED ORDER — SODIUM CHLORIDE 0.9% FLUSH
3.0000 mL | Freq: Two times a day (BID) | INTRAVENOUS | Status: DC
  Administered 2023-05-19 – 2023-05-20 (×2): 3 mL via INTRAVENOUS

## 2023-05-19 MED ORDER — SENNOSIDES-DOCUSATE SODIUM 8.6-50 MG PO TABS: 2.0000 | ORAL_TABLET | Freq: Every day | ORAL | Status: DC

## 2023-05-19 MED ORDER — HEPARIN (PORCINE) IN NACL 1000-0.9 UT/500ML-% IV SOLN
INTRAVENOUS | Status: DC | PRN
  Administered 2023-05-19: 1000 mL

## 2023-05-19 MED ORDER — SODIUM CHLORIDE 0.9% FLUSH: 3.0000 mL | INTRAVENOUS | Status: DC | PRN

## 2023-05-19 MED ORDER — MIDAZOLAM HCL 2 MG/2ML IJ SOLN
INTRAMUSCULAR | Status: AC
Start: 2023-05-19 — End: ?
  Filled 2023-05-19: qty 2

## 2023-05-19 MED ORDER — FENTANYL CITRATE (PF) 100 MCG/2ML IJ SOLN
INTRAMUSCULAR | Status: DC | PRN
  Administered 2023-05-19: 25 ug via INTRAVENOUS

## 2023-05-19 MED ORDER — ADULT MULTIVITAMIN W/MINERALS CH: 1.0000 | ORAL_TABLET | Freq: Every day | ORAL | Status: DC

## 2023-05-19 MED ORDER — SODIUM CHLORIDE 0.9 % WEIGHT BASED INFUSION
1.0000 mL/kg/h | INTRAVENOUS | Status: DC
  Administered 2023-05-19: 1 mL/kg/h via INTRAVENOUS

## 2023-05-19 MED ORDER — HYDRALAZINE HCL 20 MG/ML IJ SOLN: 10.0000 mg | INTRAMUSCULAR | Status: AC | PRN

## 2023-05-19 MED ORDER — LIDOCAINE HCL (PF) 1 % IJ SOLN
INTRAMUSCULAR | Status: DC | PRN
  Administered 2023-05-19: 10 mL

## 2023-05-19 SURGICAL SUPPLY — 20 items
BALLOON SCOREFLEX 3.0X15 (BALLOONS) IMPLANT
BALLOON TREK RX 2.5X15 (BALLOONS) IMPLANT
BALLOON ~~LOC~~ TREK NEO RX 3.5X12 (BALLOONS) IMPLANT
BALLOON ~~LOC~~ TREK NEO RX 3.5X15 (BALLOONS) IMPLANT
CATH INFINITI 5FR JL4 (CATHETERS) IMPLANT
CATH INFINITI JR4 5F (CATHETERS) IMPLANT
CATH LAUNCHER 6FR JR4 (CATHETERS) IMPLANT
CATH SWAN GANZ 7F STRAIGHT (CATHETERS) IMPLANT
DEVICE CLOSURE MYNXGRIP 6/7F (Vascular Products) IMPLANT
DRAPE BRACHIAL (DRAPES) IMPLANT
KIT ENCORE 26 ADVANTAGE (KITS) IMPLANT
PACK CARDIAC CATH (CUSTOM PROCEDURE TRAY) ×2 IMPLANT
SET ATX-X65L (MISCELLANEOUS) IMPLANT
SHEATH AVANTI 5FR X 11CM (SHEATH) IMPLANT
SHEATH AVANTI 6FR X 11CM (SHEATH) IMPLANT
SHEATH AVANTI 7FRX11 (SHEATH) IMPLANT
STATION PROTECTION PRESSURIZED (MISCELLANEOUS) IMPLANT
TUBING CIL FLEX 10 FLL-RA (TUBING) IMPLANT
WIRE GUIDERIGHT .035X150 (WIRE) IMPLANT
WIRE RUNTHROUGH .014X180CM (WIRE) IMPLANT

## 2023-05-19 NOTE — Progress Notes (Addendum)
 Pt arrived via stretcher via Corbin Dess, Charity fundraiser.   Pt alert and oriented x4. Room Air. NSR per tele. R groin soft. R femoral pulse palpable. +2. Dressing clean, dry and intact. R pedal pulse +2. Skin assessment completed with Marea Sham.

## 2023-05-19 NOTE — Interval H&P Note (Signed)
 History and Physical Interval Note:  05/19/2023 1:33 PM  Luke Mckee  has presented today for surgery, with the diagnosis of R and L Cath    Shortness of breath.  The various methods of treatment have been discussed with the patient and family. After consideration of risks, benefits and other options for treatment, the patient has consented to  Procedure(s): RIGHT/LEFT HEART CATH AND CORONARY ANGIOGRAPHY (Bilateral) PERCUTANEOUS CORONARY INTERVENTION   as a surgical intervention.  The patient's history has been reviewed, patient examined, no change in status, stable for surgery.  I have reviewed the patient's chart and labs.  Questions were answered to the patient's satisfaction.    Cath Lab Visit (complete for each Cath Lab visit)  Clinical Evaluation Leading to the Procedure:   ACS: No.  Non-ACS:    Anginal Classification: CCS II  Anti-ischemic medical therapy: Maximal Therapy (2 or more classes of medications)  Non-Invasive Test Results: Equivocal test results  Prior CABG: Previous CABG    Randene Bustard

## 2023-05-19 NOTE — Progress Notes (Signed)
 Dr. Addie Holstein came in again to speak with pt. And his wife re: cath results.

## 2023-05-19 NOTE — Progress Notes (Addendum)
 Patient back from cath lab at 1545.  Feels like a hematoma present.  Manual pressure held for 15 minutes. Appears/feels to have softened some.

## 2023-05-19 NOTE — Progress Notes (Signed)
 Dr. Addie Holstein at bedside to assess patient, stated right groin site feels/looks ok.  Per Dr. Addie Holstein, add 1 hour to patient's bedrest time.

## 2023-05-20 ENCOUNTER — Other Ambulatory Visit: Payer: Self-pay

## 2023-05-20 ENCOUNTER — Encounter: Payer: Self-pay | Admitting: Cardiology

## 2023-05-20 DIAGNOSIS — T82858A Stenosis of vascular prosthetic devices, implants and grafts, initial encounter: Secondary | ICD-10-CM | POA: Diagnosis not present

## 2023-05-20 DIAGNOSIS — R600 Localized edema: Secondary | ICD-10-CM | POA: Diagnosis not present

## 2023-05-20 DIAGNOSIS — R0602 Shortness of breath: Secondary | ICD-10-CM | POA: Diagnosis not present

## 2023-05-20 DIAGNOSIS — Z79899 Other long term (current) drug therapy: Secondary | ICD-10-CM | POA: Diagnosis not present

## 2023-05-20 DIAGNOSIS — I25118 Atherosclerotic heart disease of native coronary artery with other forms of angina pectoris: Secondary | ICD-10-CM | POA: Diagnosis not present

## 2023-05-20 DIAGNOSIS — R9430 Abnormal result of cardiovascular function study, unspecified: Secondary | ICD-10-CM | POA: Diagnosis not present

## 2023-05-20 DIAGNOSIS — I2582 Chronic total occlusion of coronary artery: Secondary | ICD-10-CM | POA: Diagnosis not present

## 2023-05-20 DIAGNOSIS — Z7902 Long term (current) use of antithrombotics/antiplatelets: Secondary | ICD-10-CM | POA: Diagnosis not present

## 2023-05-20 DIAGNOSIS — I110 Hypertensive heart disease with heart failure: Secondary | ICD-10-CM | POA: Diagnosis not present

## 2023-05-20 LAB — BASIC METABOLIC PANEL WITH GFR
Anion gap: 11 (ref 5–15)
BUN: 14 mg/dL (ref 8–23)
CO2: 28 mmol/L (ref 22–32)
Calcium: 8.9 mg/dL (ref 8.9–10.3)
Chloride: 101 mmol/L (ref 98–111)
Creatinine, Ser: 0.89 mg/dL (ref 0.61–1.24)
GFR, Estimated: >60 mL/min (ref >60)
Glucose, Bld: 102 mg/dL — ABNORMAL HIGH (ref 70–99)
Potassium: 4 mmol/L (ref 3.5–5.1)
Sodium: 140 mmol/L (ref 135–145)

## 2023-05-20 LAB — CBC
HCT: 36.4 % — ABNORMAL LOW (ref 39.0–52.0)
Hemoglobin: 11.9 g/dL — ABNORMAL LOW (ref 13.0–17.0)
MCH: 31.1 pg (ref 26.0–34.0)
MCHC: 32.7 g/dL (ref 30.0–36.0)
MCV: 95 fL (ref 80.0–100.0)
Platelets: 232 10*3/uL (ref 150–400)
RBC: 3.83 MIL/uL — ABNORMAL LOW (ref 4.22–5.81)
RDW: 13.3 % (ref 11.5–15.5)
WBC: 8.9 10*3/uL (ref 4.0–10.5)
nRBC: 0 % (ref 0.0–0.2)

## 2023-05-20 MED ORDER — CLOPIDOGREL BISULFATE 75 MG PO TABS
75.0000 mg | ORAL_TABLET | Freq: Every day | ORAL | 3 refills | Status: AC
  Filled 2023-05-20: qty 90, 90d supply, fill #0

## 2023-05-20 NOTE — Discharge Instructions (Signed)
**  PLEASE REMEMBER TO BRING ALL OF YOUR MEDICATIONS TO EACH OF YOUR FOLLOW-UP OFFICE VISITS. ° °NO HEAVY LIFTING OR SEXUAL ACTIVITY X 7 DAYS. °NO DRIVING X 3-5 DAYS. °NO SOAKING BATHS, HOT TUBS, POOLS, ETC., X 7 DAYS. ° °Groin Site Care °Refer to this sheet in the next few weeks. These instructions provide you with information on caring for yourself after your procedure. Your caregiver may also give you more specific instructions. Your treatment has been planned according to current medical practices, but problems sometimes occur. Call your caregiver if you have any problems or questions after your procedure. °HOME CARE INSTRUCTIONS °· You may shower 24 hours after the procedure. Remove the bandage (dressing) and gently wash the site with plain soap and water. Gently pat the site dry.  °· Do not apply powder or lotion to the site.  °· Do not sit in a bathtub, swimming pool, or whirlpool for 5 to 7 days.  °· No bending, squatting, or lifting anything over 10 pounds (4.5 kg) as directed by your caregiver.  °· Inspect the site at least twice daily.  °· Do not drive home if you are discharged the same day of the procedure. Have someone else drive you.  ° °What to expect: °· Any bruising will usually fade within 1 to 2 weeks.  °· Blood that collects in the tissue (hematoma) may be painful to the touch. It should usually decrease in size and tenderness within 1 to 2 weeks.  °SEEK IMMEDIATE MEDICAL CARE IF: °· You have unusual pain at the groin site or down the affected leg.  °· You have redness, warmth, swelling, or pain at the groin site.  °· You have drainage (other than a small amount of blood on the dressing).  °· You have chills.  °· You have a fever or persistent symptoms for more than 72 hours.  °· You have a fever and your symptoms suddenly get worse.  °· Your leg becomes pale, cool, tingly, or numb.  °You have heavy bleeding from the site. Hold pressure on the site. . ° °

## 2023-05-20 NOTE — Plan of Care (Signed)
  Problem: Clinical Measurements: Goal: Ability to maintain clinical measurements within normal limits will improve Outcome: Progressing Goal: Will remain free from infection Outcome: Progressing Goal: Respiratory complications will improve Outcome: Progressing Goal: Cardiovascular complication will be avoided Outcome: Progressing   Problem: Pain Managment: Goal: General experience of comfort will improve and/or be controlled Outcome: Progressing   Problem: Safety: Goal: Ability to remain free from injury will improve Outcome: Progressing   Problem: Cardiovascular: Goal: Vascular access site(s) Level 0-1 will be maintained Outcome: Progressing

## 2023-05-20 NOTE — Discharge Summary (Signed)
 Discharge Summary    Patient ID: [place] MRN: [id number]; DOB: Oct 12, 1940  Admit date: 05/19/2023 Discharge date: 05/20/2023  PCP:  Raina Bunting, DO   [facility] Providers Cardiologist:  Belva Boyden, MD  Cardiology APP:  Durward Gillis, PA-C       Discharge Diagnoses    Principal Problem:   Coronary artery disease involving native coronary artery of native heart with angina pectoris [facility])  **s/p PTCA of the VG  OM2 due to in-stent restenosis  Active Problems:   Abnormal stress test   Shortness of breath   History of cerebrovascular accident (CVA) with residual deficit   Benign essential HTN   Obesity (BMI 30.0-34.9)   Hypothyroidism   OSA on CPAP   CAD S/P percutaneous coronary angioplasty    Diagnostic Studies/Procedures    Cardiac Catheterization and Percutaneous Coronary Intervention 5.8.2025  Left Anterior Descending  Mid LAD-1 lesion is 85% stenosed.  Mid LAD-2 lesion is 100% stenosed.  First Diagonal Branch  Vessel is small in size.  First Septal Branch  Vessel is small in size.  Second Diagonal Branch  Vessel is small in size.  Left Circumflex  Vessel is large.  First Obtuse Marginal Branch  Vessel is small in size.  Second Obtuse Marginal Branch  2nd Mrg lesion is 90% stenosed.  Left Posterior Descending Artery  Collaterals  LPDA filled by collaterals from 1st Sept.    Collaterals  LPDA filled by collaterals from Dist LAD.    Left Posterior Atrioventricular Artery  LPAV lesion is 100% stenosed.  Right Coronary Artery  Ost RCA to Prox RCA lesion is 100% stenosed. The lesion is chronically occluded.  LIMA LIMA Graft To Dist LAD  LIMA graft was visualized by angiography. - Mid LAD Very tortuous innominate artery takes 2 hairpin turns to get to the graft. Would not be able to get to the heart via radial access.  Saphenous Graft To 2nd Mrg  SVG graft was visualized by angiography and is large.  Origin to Prox Graft  lesion is 70% stenosed. Vessel is the culprit lesion. The lesion is concentric. The lesion was previously treated using a drug eluting stent over 2 years ago.        **Status-post PTCA of the proximal vein graft due to in-stent restenosis.  Mid Graft to Dist Graft lesion is 45% stenosed.  Saphenous Graft To LPDA  SVG graft was not visualized due to known occlusion. - LPDA  Origin lesion is 100% stenosed.   Diagnostic Dominance: Left  Intervention   RECOMMENDATIONS   In the absence of any other complications or medical issues, we expect the patient to be ready for discharge from an interventional cardiology perspective on 05/20/2023.   Recommend dual antiplatelet therapy with Aspirin  81mg  daily and Clopidogrel  75mg  daily.   Patient should be on aspirin  and Plavix  for at least 6 months and then can stop aspirin  after that but continue Plavix  long-term due to longstanding ISR. _____________   History of Present Illness     Jaxxon Datema is a 83 y.o. male with with a history of CAD status post CABG x 3 in [place]  in 2005 status post stent placement within the proximal vein graft to the OM2 in 2018, hypertension, hyperlipidemia, hypothyroidism, sleep apnea, and cryptogenic stroke (no A-fib on subsequent loop monitoring).  In January 2025, patient reported intermittent dyspnea on exertion.  Echocardiogram showed an EF of 55 to 60% with grade 1 diastolic dysfunction and mild MR.  In the setting of ongoing dyspnea, Lexiscan  PET stress was performed and showed a small reversible defect in the apical to mid inferior location, consistent with ischemia.  EF 62%.  Patient was seen back in clinic on May 17, 2023 and in the setting of ongoing dyspnea and abnormal stress test, decision was made to pursue diagnostic catheterization.  Hospital Course     Consultants: None   Patient presented to the [facility] on May 19, 2023 and underwent diagnostic catheterization  revealing native multivessel coronary artery disease with patent LIMA to the LAD, in-stent restenosis within the proximal vein graft to the second obtuse marginal, and known occlusion of the RCA and vein graft to the LPDA.  The in-stent restenosis within the vein graft to the OM 2 was felt to be the culprit and this was successfully treated with PTCA.  Postprocedure, Mr. Alioto has done well without any recurrent chest pain and ambulating without symptoms or limitations.  He does have a small, resolving R groin hematoma w/ associated ecchymosis.  He will be discharged home today in good condition with office follow-up arranged for one week from today.      Did the patient have an acute coronary syndrome (MI, NSTEMI, STEMI, etc) this admission?:  No                               Did the patient have a percutaneous coronary intervention (stent / angioplasty)?:  Yes.     Cath/PCI Registry Performance & Quality Measures: Aspirin  prescribed? - Yes ADP Receptor Inhibitor (Plavix /Clopidogrel , Brilinta/Ticagrelor or Effient/Prasugrel) prescribed (includes medically managed patients)? - Yes High Intensity Statin (Lipitor  40-80mg  or Crestor 20-40mg ) prescribed? - Yes For EF <40%, was ACEI/ARB prescribed? - Not Applicable (EF >/= 40%) For EF <40%, Aldosterone Antagonist (Spironolactone  or Eplerenone) prescribed? - Not Applicable (EF >/= 40%) Cardiac Rehab Phase II ordered? - Yes       The patient will be scheduled for a TOC follow up appointment in 7 days.  A message has been sent to the [facility] and Scheduling Pool at the office where the patient should be seen for follow up.  _____________  Discharge Vitals Blood pressure 105/70, pulse 76, temperature 98.3 F (36.8 C), resp. rate 18, height 5\' 8"  (1.727 m), weight 98.4 kg, SpO2 98%.  Filed Weights   05/19/23 1248  Weight: 98.4 kg    Labs & Radiologic Studies    CBC Recent Labs    05/17/23 1203 05/19/23 1351 05/19/23 1441 05/20/23 0537   WBC 9.1  --   --  8.9  HGB 13.5   < > 11.2* 11.9*  HCT 41.6   < > 33.0* 36.4*  MCV 97  --   --  95.0  PLT 287  --   --  232   < > = values in this interval not displayed.   Basic Metabolic Panel Recent Labs    16/10/96 1203 05/19/23 1351 05/19/23 1441 05/20/23 0537  NA 142   < > 127* 140  K 4.8   < > 3.3* 4.0  CL 99  --   --  101  CO2 27  --   --  28  GLUCOSE 102*  --   --  102*  BUN 15  --   --  14  CREATININE 1.06  --   --  0.89  CALCIUM  9.9  --   --  8.9   < > =  values in this interval not displayed.   _____________   Disposition   Pt is being discharged home today in good condition.  Follow-up Plans & Appointments     Follow-up Information     Gennaro Khat, Cadence H, PA-C Follow up on 05/27/2023.   Specialty: Cardiology Why: 10:55 AM Contact information: [address] [place] [address] [phone]                Discharge Instructions     AMB Referral to Cardiac Rehabilitation - Phase II   Complete by: As directed    Diagnosis:  Coronary Stents PTCA     After initial evaluation and assessments completed: Virtual Based Care may be provided alone or in conjunction with Phase 2 Cardiac Rehab based on patient barriers.: Yes   Intensive Cardiac Rehabilitation (ICR) [facility] location only OR Traditional Cardiac Rehabilitation (TCR) *If criteria for ICR are not met will enroll in TCR [facility] only): Yes   Call MD for:  difficulty breathing, headache or visual disturbances   Complete by: As directed    Call MD for:  redness, tenderness, or signs of infection (pain, swelling, redness, odor or green/yellow discharge around incision site)   Complete by: As directed    Call MD for:  severe uncontrolled pain   Complete by: As directed    Diet - low sodium heart healthy   Complete by: As directed    Increase activity slowly   Complete by: As directed         Discharge Medications   Allergies as of 05/20/2023       Reactions   Ace Inhibitors     Cephalexin    Vomiting and diarrhea   Crestor [rosuvastatin Calcium ] Other (See Comments)   myalgia   Tape Rash   blistering        Medication List     TAKE these medications    acetaminophen  650 MG CR tablet Commonly known as: TYLENOL  Take 1,300 mg by mouth at bedtime as needed for pain.   albuterol  108 (90 Base) MCG/ACT inhaler Commonly known as: VENTOLIN  HFA Inhale 1-2 puffs into the lungs every 4 (four) hours as needed for wheezing or shortness of breath (cough).   aspirin  EC 81 MG tablet Take 81 mg by mouth daily.   atenolol  25 MG tablet Commonly known as: TENORMIN  Take 1 tablet (25 mg total) by mouth daily.   atorvastatin  80 MG tablet Commonly known as: LIPITOR  Take 1 tablet (80 mg total) by mouth daily. What changed: when to take this   CALCIUM  600 + D PO Take 1 tablet by mouth daily.   clopidogrel  75 MG tablet Commonly known as: PLAVIX  Take 1 tablet (75 mg total) by mouth daily.   clotrimazole -betamethasone  cream Commonly known as: LOTRISONE  Apply twice a day for 1-2 weeks, may repeat if need in future What changed:  when to take this reasons to take this   Coenzyme Q10 100 MG Tabs Take 100 mg by mouth daily.   colchicine  0.6 MG tablet For acute gout flare take 2 pills at once, then take a 3rd pill 2 hours later. Then take 1 pill daily for 7-10 days or until resolved. What changed:  how much to take how to take this when to take this reasons to take this additional instructions   diclofenac sodium 1 % Gel Commonly known as: VOLTAREN Apply 2 g topically 4 (four) times daily as needed for pain. Foot pain   escitalopram   20 MG tablet Commonly known as: LEXAPRO  Take 1 tablet (20 mg total) by mouth daily.   furosemide  80 MG tablet Commonly known as: LASIX  TAKE 1 TABLET(80 MG) BY MOUTH TWICE DAILY   ipratropium 0.06 % nasal spray Commonly known as: ATROVENT  Place 2 sprays into both nostrils 4 (four) times daily as needed for rhinitis.    levothyroxine  125 MCG tablet Commonly known as: SYNTHROID  TAKE 1 TABLET(125 MCG) BY MOUTH DAILY BEFORE BREAKFAST   loratadine  10 MG tablet Commonly known as: CLARITIN  Take 10 mg by mouth daily.   LORazepam  0.5 MG tablet Commonly known as: ATIVAN  Take 1 tablet (0.5 mg total) by mouth daily as needed for anxiety or sleep.   meclizine  12.5 MG tablet Commonly known as: ANTIVERT  Take 1 tablet (12.5 mg total) by mouth 3 (three) times daily as needed for dizziness.   montelukast  10 MG tablet Commonly known as: SINGULAIR  Take 1 tablet (10 mg total) by mouth at bedtime.   multivitamin tablet Take 1 tablet by mouth daily.   nitroGLYCERIN  0.4 MG SL tablet Commonly known as: NITROSTAT  Place 1 tablet (0.4 mg total) under the tongue every 5 (five) minutes as needed for chest pain.   Pataday 0.1 % ophthalmic solution Generic drug: olopatadine Place 1 drop into both eyes daily as needed for allergies.   sennosides-docusate sodium  8.6-50 MG tablet Commonly known as: SENOKOT-S Take 2 tablets by mouth daily.   traZODone  50 MG tablet Commonly known as: DESYREL  Take 1-2 tablets (50-100 mg total) by mouth at bedtime. What changed: how much to take   Vitamin D 50 MCG (2000 UT) tablet Take 2,000 Units by mouth daily.           Outstanding Labs/Studies   No Duration of Discharge Encounter: APP Time: 33 minutes   Signed, Laneta Pintos, NP 05/20/2023, 9:14 AM

## 2023-05-20 NOTE — Progress Notes (Signed)
 This RN provided discharge instructions and teaching to the patient. The patient verbalized and demonstrated understanding of the provided instructions. All outstanding questions resolved. Bilateral arm PIVs removed. Both cannulas intact. Pt tolerated well. All belongings packed and in tow. Patient taken to the discharge lounge.

## 2023-05-21 LAB — LIPOPROTEIN A (LPA): Lipoprotein (a): 123.3 nmol/L — ABNORMAL HIGH (ref <75.0)

## 2023-05-24 ENCOUNTER — Other Ambulatory Visit: Payer: Self-pay

## 2023-05-26 ENCOUNTER — Ambulatory Visit: Payer: Medicare PPO | Admitting: Physical Therapy

## 2023-05-27 ENCOUNTER — Encounter: Payer: Self-pay | Admitting: Medical

## 2023-05-27 ENCOUNTER — Ambulatory Visit: Attending: Medical | Admitting: Medical

## 2023-05-27 VITALS — BP 107/59 | HR 64 | Resp 16 | Ht 68.0 in | Wt 220.6 lb

## 2023-05-27 DIAGNOSIS — Z951 Presence of aortocoronary bypass graft: Secondary | ICD-10-CM | POA: Diagnosis not present

## 2023-05-27 NOTE — Patient Instructions (Signed)
 Medication Instructions:  Your physician recommends that you continue on your current medications as directed. Please refer to the Current Medication list given to you today.   *If you need a refill on your cardiac medications before your next appointment, please call your pharmacy*  Lab Work: No labs ordered today   Testing/Procedures: No test ordered today   Follow-Up: At [facility], you and your health needs are our priority.  As part of our continuing mission to provide you with exceptional heart care, our providers are all part of one team.  This team includes your primary Cardiologist (physician) and Advanced Practice Providers or APPs (Physician Assistants and Nurse Practitioners) who all work together to provide you with the care you need, when you need it.  Your next appointment:   3 month(s)  Provider:   You may see Timothy Gollan, MD or one of the following Advanced Practice Providers on your designated Care Team:   Laneta Pintos, NP Gildardo Labrador, PA-C Varney Gentleman, PA-C Cadence George, PA-C Ronald Cockayne, NP Morey Ar, NP

## 2023-05-27 NOTE — Progress Notes (Signed)
 Cardiology Office Note:  .   Date:  05/27/2023  ID:  [name], DOB 1940-08-17, MRN [id number] PCP: Raina Bunting, DO  [facility] Providers Cardiologist:  Belva Boyden, MD Cardiology APP:  Durward Gillis, PA-C {  History of Present Illness: .   Luke Mckee is a 83 y.o. male with a h/o CAD s/p CABG x3 in [place]  2005 with subsequent stent placement, anxiety, OSA on CPAP, hypertension, history of stroke x2, status post loop monitor with no A-fib detected who presents for follow-up for heart cath.   Patient has a history of CAD with CABG x 3 in 2005. He underwent DES to OM SVG in 2018.  Patient reported another stent placement, however records unclear as to when or where.   Patient has a history of stroke x 2.  He has a loop recorder implanted with no evidence of A-fib so far. The loop recorder is no longer transmitting, but still in place.   Patient was seen in the ER 07/29/2022 for pre-syncope and dizziness.  Blood pressure 155/114 pulse 65.  EKG showed normal sinus rhythm.  Labs were reassuring.  CT head and MRI brain were nonacute.  Patient was given meclizine , fluids and Atenolol  was cut in half. Patient was seen in follow-up in July 2024 blood pressure was low, orthostatics were negative.  Spironolactone  was stopped.  Patient was last seen in January 26, 2023 reporting intermittent shortness of breath.  Echo and stress test were ordered. Echo showed LVEF 55-60%, no WMA, G1DD, normal RVSF, mild MR. Cardiac PET stress showed mild ischemia in the inf wall. Study is probably low risk, sensitivity and specificity degraded by extracardiac activity.  Patient was seen back in the office 05/17/2023 and in the setting of ongoing dyspnea and abnormal stress test, the patient was set up for heart catheterization.  Patient presented on May 19, 2023 and underwent diagnostic catheterization showing multivessel CAD with patent LIMA to the LAD, in-stent restenosis with the proximal vein graft  to the second obtuse marginal, and known occlusion of the RCA and vein graft to the LPDA.  The in-stent restenosis within the vein graft to OM 2 was felt to be the culprit and was successfully treated with PTCA.  Postprocedure was fairly uncomplicated.  He did have small resolving right groin hematoma.  Today, the patient reports symptoms are improved. He mowed the yard. He denies chest pain. He got a little SOB. He is taking ASA and Plavix . Hematoma on the right groin is improving.    Studies Reviewed: Aaron Aas        R/L heart cath 05/2023   Mid LAD-1 lesion is 85% stenosed. Mid LAD-2 lesion is 100% stenosed.   2nd Mrg lesion is 90% stenosed.   LPAV lesion is 100% stenosed.   Ost RCA to Prox RCA lesion is 100% stenosed.   GRAFTS   LIMA-LAD graft was visualized by angiography.  Angiographically normal   SVG-LPDA graft was not visualized due to known occlusion. Injections performed, flush occlusion. : Origin lesion is 100% stenosed.   SVG-OM1 graft was visualized by angiography and is large.  Previously placed origin to Prox Graft stent is 70% stenosed.   Balloon angioplasty was performed using a Score Flex 3.0 mm x 15 mm balloon followed by 3.5 mm x 15 mm to high atmospheres.  Post intervention, there is a 20% residual stenosis.   SVG-OM: Mid Graft to Dist Graft lesion is 45% stenosed.   --------------- Hemodynamics---------------------   LV  end diastolic pressure is normal.   There is no aortic valve stenosis.   Essentially Normal Right Heart Cath Pressures   Diagnostic Dominance: Left                                            Intervention  Severe native CAD with 2 of 3 patent grafts: - LAD diffusely diseased after major 1st Diag & 1st Sep (SP1) branches then occluded in the mid vessel. The diagonal branch has proximal 60% stenosis - Small caliber Ramus Intermedius branch with minimal disease - Large-caliber dominant LCx that has minimal disease into the branch. It gives off a moderate-sized  1st Mrg (OM1) that has proximal 90% stenosis (then fills via graft). The distal apical circumflex is occluded prior to giving off previously would be described as a left PDA (the left PDA fills via collaterals from SP1 and distal LAD branches filled via LIMA to LAD) - Native RCA 100% occluded - Widely patent LIMA to LAD - SVG to OM1 with ostial and proximal stent that has 70% ISR in the mid graft valve segment with no stenosis) vessels (with distal flow in the OM. ==> Successful scoring balloon and [place] balloon PTCA of the ostial and proximal SVG stent using 3.0 mm Scorelex balloon followed by 3.5 mm [place] balloon to high atmospheres postdilated to 3.6 mm. 70% reduced to 20% stenosis.    Relatively Normal Right Heart Pressure/Numbers - RAP mean 8 mmHg, RVP-EDP 36/6-14 mmHg; PAP-mean 35/9-21 mmHg, PCWP mean 12 mmHg. - ALP-MAP 118/56-83 mmHg; LVP-EDP 122/6-20 - Ao sat 87%, PA sat 57%. Cardiac Output-Index by Fick 5.94-2.1; thermodilution 2.71-1.76 suggesting potential valvular disease    RECOMMENDATIONS   In the absence of any other complications or medical issues, we expect the patient to be ready for discharge from an interventional cardiology perspective on 05/20/2023.   Recommend dual antiplatelet therapy with Aspirin  81mg  daily and Clopidogrel  75mg  daily.   Patient should be on aspirin  and Plavix  for at least 6 months and then can stop aspirin  after that but continue Plavix  long-term due to longstanding ISR.  Cardiac PET stress 04/2023    LV perfusion is abnormal. There is evidence of ischemia. Defect 1: There is a small defect with mild reduction in uptake present in the apical to mid inferior location(s) that is reversible. There is normal wall motion in the defect area. Consistent with ischemia.   Rest left ventricular function is normal. Rest EF: 62%. Stress left ventricular function is normal. Stress EF: 65%. End diastolic cavity size is normal.   Myocardial blood flow reserve is not reported in  this patient due to technical or patient-specific concerns that affect accuracy.   Coronary calcium  assessment not performed due to prior revascularization.   Findings are consistent with mild ischemia in the inferior wall. The study is probably low risk; sensitivity and specificity of the study are degraded by significant extracardiac activity.   Electronically signed by: Sammy Crisp, MD.   Echo 01/2023 1. Left ventricular ejection fraction, by estimation, is 55 to 60%. The  left ventricle has normal function. The left ventricle has no regional  wall motion abnormalities. Left ventricular diastolic parameters are  consistent with Grade I diastolic  dysfunction (impaired relaxation).   2. Right ventricular systolic function is normal. The right ventricular  size is normal.   3. The mitral valve is normal in  structure. Mild mitral valve  regurgitation.   4. The aortic valve is tricuspid. Aortic valve regurgitation is not  visualized.   5. The inferior vena cava is normal in size with greater than 50%  respiratory variability, suggesting right atrial pressure of 3 mmHg.      Physical Exam:   VS:  BP (!) 107/59 (BP Location: Left Arm, Patient Position: Sitting, Cuff Size: Large)   Pulse 64   Resp 16   Ht 5\' 8"  (1.727 m)   Wt 220 lb 9.6 oz (100.1 kg)   SpO2 97%   BMI 33.54 kg/m    Wt Readings from Last 3 Encounters:  05/27/23 220 lb 9.6 oz (100.1 kg)  05/19/23 217 lb (98.4 kg)  05/17/23 217 lb (98.4 kg)    GEN: Well nourished, well developed in no acute distress NECK: No JVD; No carotid bruits CARDIAC: RRR, no murmurs, rubs, gallops RESPIRATORY:  Clear to auscultation without rales, wheezing or rhonchi  ABDOMEN: Soft, non-tender, non-distended EXTREMITIES:  No edema; No deformity   ASSESSMENT AND PLAN: .    CAD s/p CABG with recent PCI Patient had an abnormal stress test and persistent DOE and was set up for R/L heart cath. Cath showed native CAD with 2 out of 3 patent  grafts with RCA 100% occluded, widely patent LIMA to LAD, SVG to OM1 with ostial proximal stent with 70% in-stent restenosis treated with successful scoring balloon and [place] balloon PTCA of ostial to proximal SVG stent.  Right heart pressures were normal.  Was recommended patient stay on aspirin  and Plavix  for at least 6 months with discontinuation of aspirin  after this.  Patient reports he is overall feeling much better.  He was able to mow the lawn yesterday.  Right groin cath site has improving hematoma.  Continue aspirin  81 mg daily and Plavix  75 mg daily for at least 6 months.  Continue Lipitor  80 mg daily and sublingual nitro.  I will refer him to cardiac rehab.  HFpEF He reports breathing is overall better. He is euvolemic. Continue lasix  80mg  daily.   HTN BP is normal, continue lasix  and atenolol .   HLD LDL 50. Continue Crestor 50mg  daily.     Cardiac Rehabilitation Eligibility Assessment  The patient is ready to start cardiac rehabilitation from a cardiac standpoint.       Dispo: Follow-up in 3 motns  Signed, Zackery Brine Rebekah Canada, PA-C

## 2023-06-02 ENCOUNTER — Ambulatory Visit: Payer: Medicare PPO | Admitting: Physical Therapy

## 2023-06-08 ENCOUNTER — Ambulatory Visit: Payer: Medicare PPO | Admitting: Medical

## 2023-06-09 ENCOUNTER — Ambulatory Visit: Payer: Medicare PPO | Admitting: Physical Therapy

## 2023-06-14 ENCOUNTER — Ambulatory Visit: Admitting: Medical

## 2023-06-16 ENCOUNTER — Ambulatory Visit: Payer: Medicare PPO | Admitting: Physical Therapy

## 2023-06-17 ENCOUNTER — Other Ambulatory Visit: Payer: Self-pay | Admitting: Family Medicine

## 2023-06-17 DIAGNOSIS — E782 Mixed hyperlipidemia: Secondary | ICD-10-CM

## 2023-06-20 ENCOUNTER — Encounter: Payer: Self-pay | Admitting: Student in an Organized Health Care Education/Training Program

## 2023-06-20 ENCOUNTER — Ambulatory Visit: Admitting: Student in an Organized Health Care Education/Training Program

## 2023-06-20 VITALS — BP 110/60 | HR 64 | Temp 98.3°F | Ht 68.0 in | Wt 218.4 lb

## 2023-06-20 DIAGNOSIS — R0602 Shortness of breath: Secondary | ICD-10-CM

## 2023-06-20 DIAGNOSIS — I5030 Unspecified diastolic (congestive) heart failure: Secondary | ICD-10-CM

## 2023-06-20 DIAGNOSIS — G4733 Obstructive sleep apnea (adult) (pediatric): Secondary | ICD-10-CM | POA: Diagnosis not present

## 2023-06-20 NOTE — Telephone Encounter (Signed)
 Requested Prescriptions  Pending Prescriptions Disp Refills   atorvastatin  (LIPITOR ) 80 MG tablet [Pharmacy Med Name: ATORVASTATIN  80MG  TABLETS] 90 tablet 3    Sig: TAKE 1 TABLET(80 MG) BY MOUTH DAILY     Cardiovascular:  Antilipid - Statins Failed - 06/20/2023 12:24 PM      Failed - Valid encounter within last 12 months    Recent Outpatient Visits           2 months ago Coronary artery disease involving native coronary artery of native heart with angina pectoris [facility])   [facility] [place], Kayleen Party, DO       Future Appointments             In 2 months Gollan, Timothy J, MD [facility]            Failed - Lipid Panel in normal range within the last 12 months    Cholesterol  Date Value Ref Range Status  12/15/2022 104 <200 mg/dL Final   LDL Cholesterol (Calc)  Date Value Ref Range Status  12/15/2022 50 mg/dL (calc) Final    Comment:    Reference range: <100 . Desirable range <100 mg/dL for primary prevention;   <70 mg/dL for patients with CHD or diabetic patients  with > or = 2 CHD risk factors. Aaron Aas LDL-C is now calculated using the Martin-Hopkins  calculation, which is a validated novel method providing  better accuracy than the Friedewald equation in the  estimation of LDL-C.  Melinda Sprawls et al. Erroll Heard. 9629;528(41): 2061-2068  (http://education.[employer].com/faq/FAQ164)    HDL  Date Value Ref Range Status  12/15/2022 39 (L) > OR = 40 mg/dL Final   Triglycerides  Date Value Ref Range Status  12/15/2022 74 <150 mg/dL Final         Passed - Patient is not pregnant

## 2023-06-20 NOTE — Progress Notes (Unsigned)
 Synopsis: Referred in for dyspnea by Gennaro Khat, Cadence H, PA-C  Assessment & Plan:   #Shortness of breath (Primary) #HFpEF #OSA  Pleasant 83 year old male presenting for the evaluation of shortness of breath in the setting of significant cardiac history. He's had a recent LHC that showed a multi-vessel disease (history of previous CABG and stenting) with balloon angioplasty performed for in-stent stenosis. His LVEDP was noted to be elevated at 20 mmHg which suggests HFpEF as the driver behind his symptoms. He is currently maintained on furosemide  for diuresis.  Given his exposure to fumes and jet fuel, I will complete the workup with a pulmonary function test (spirometry, lung volumes, DLCO) to assess for any underlying obstructive lung disease. I have encouraged him to continue to use his albuterol  as needed for now pending his PFTs.  - Pulmonary Function Test; Future - Continue Albuterol  PRN - Continue CPAP  Return in about 4 weeks (around 07/18/2023).  I spent 60 minutes caring for this patient today, including preparing to see the patient, obtaining a medical history , reviewing a separately obtained history, performing a medically appropriate examination and/or evaluation, counseling and educating the patient/family/caregiver, ordering medications, tests, or procedures, documenting clinical information in the electronic health record, and independently interpreting results (not separately reported/billed) and communicating results to the patient/family/caregiver  Vergia Glasgow, MD [facility]   End of visit medications:  No orders of the defined types were placed in this encounter.    Current Outpatient Medications:    acetaminophen  (TYLENOL ) 650 MG CR tablet, Take 1,300 mg by mouth at bedtime as needed for pain., Disp: , Rfl:    albuterol  (VENTOLIN  HFA) 108 (90 Base) MCG/ACT inhaler, Inhale 1-2 puffs into the lungs every 4 (four) hours as needed for wheezing  or shortness of breath (cough)., Disp: 1 each, Rfl: 3   aspirin  EC 81 MG tablet, Take 81 mg by mouth daily., Disp: , Rfl:    atenolol  (TENORMIN ) 25 MG tablet, Take 1 tablet (25 mg total) by mouth daily., Disp: 90 tablet, Rfl: 3   atorvastatin  (LIPITOR ) 80 MG tablet, TAKE 1 TABLET(80 MG) BY MOUTH DAILY, Disp: 90 tablet, Rfl: 1   Calcium  Carb-Cholecalciferol (CALCIUM  600 + D PO), Take 1 tablet by mouth daily., Disp: , Rfl:    Cholecalciferol (VITAMIN D) 50 MCG (2000 UT) tablet, Take 2,000 Units by mouth daily., Disp: , Rfl:    clopidogrel  (PLAVIX ) 75 MG tablet, Take 1 tablet (75 mg total) by mouth daily., Disp: 90 tablet, Rfl: 3   clotrimazole -betamethasone  (LOTRISONE ) cream, Apply twice a day for 1-2 weeks, may repeat if need in future (Patient taking differently: as needed. Apply twice a day for 1-2 weeks, may repeat if need in future), Disp: 30 g, Rfl: 1   Coenzyme Q10 100 MG TABS, Take 100 mg by mouth daily., Disp: , Rfl:    colchicine  0.6 MG tablet, For acute gout flare take 2 pills at once, then take a 3rd pill 2 hours later. Then take 1 pill daily for 7-10 days or until resolved. (Patient taking differently: Take 0.6 mg by mouth daily as needed (For acute gout flare take 2 pills at once, then take a 3rd pill 2 hours later. Then take 1 pill daily for 7-10 days or until resolved.).), Disp: 30 tablet, Rfl: 2   diclofenac sodium (VOLTAREN) 1 % GEL, Apply 2 g topically 4 (four) times daily as needed for pain. Foot pain, Disp: , Rfl:    escitalopram  (LEXAPRO ) 20  MG tablet, Take 1 tablet (20 mg total) by mouth daily., Disp: 90 tablet, Rfl: 3   furosemide  (LASIX ) 80 MG tablet, TAKE 1 TABLET(80 MG) BY MOUTH TWICE DAILY, Disp: 180 tablet, Rfl: 3   ipratropium (ATROVENT ) 0.06 % nasal spray, Place 2 sprays into both nostrils 4 (four) times daily as needed for rhinitis., Disp: 15 mL, Rfl: 2   levothyroxine  (SYNTHROID ) 125 MCG tablet, TAKE 1 TABLET(125 MCG) BY MOUTH DAILY BEFORE BREAKFAST, Disp: 90 tablet, Rfl:  2   loratadine  (CLARITIN ) 10 MG tablet, Take 10 mg by mouth daily., Disp: , Rfl:    LORazepam  (ATIVAN ) 0.5 MG tablet, Take 1 tablet (0.5 mg total) by mouth daily as needed for anxiety or sleep., Disp: 30 tablet, Rfl: 2   meclizine  (ANTIVERT ) 12.5 MG tablet, Take 1 tablet (12.5 mg total) by mouth 3 (three) times daily as needed for dizziness., Disp: 30 tablet, Rfl: 1   montelukast  (SINGULAIR ) 10 MG tablet, Take 1 tablet (10 mg total) by mouth at bedtime., Disp: 90 tablet, Rfl: 3   Multiple Vitamin (MULTIVITAMIN) tablet, Take 1 tablet by mouth daily., Disp: , Rfl:    nitroGLYCERIN  (NITROSTAT ) 0.4 MG SL tablet, Place 1 tablet (0.4 mg total) under the tongue every 5 (five) minutes as needed for chest pain., Disp: 20 tablet, Rfl: 2   PATADAY 0.1 % ophthalmic solution, Place 1 drop into both eyes daily as needed for allergies., Disp: , Rfl:    sennosides-docusate sodium  (SENOKOT-S) 8.6-50 MG tablet, Take 2 tablets by mouth daily., Disp: , Rfl:    traZODone  (DESYREL ) 50 MG tablet, Take 1-2 tablets (50-100 mg total) by mouth at bedtime. (Patient taking differently: Take 50 mg by mouth at bedtime.), Disp: , Rfl:    Subjective:   PATIENT ID: [name] GENDER: male DOB: 1940/08/17, MRN: [id number]  Chief Complaint  Patient presents with   New Patient (Initial Visit)    SOB, referred by cardiology, has had improvement in symptoms prior  Still having some DOE but doesn't complain of it until he's outside. Thinking it may be his allergies or the heat. Hasn't been using his albuterol  inhaler much.  Would like to go over the medications with a provider since he would like to discuss discontinuing then.    HPI  Is a pleasant 83 year old male presenting to clinic for the evaluation of shortness of breath.  Patient has been referred to us  by cardiology given symptoms of shortness of breath and exertional dyspnea.  He denies having any cough or wheezing.  He does not have any chest pain or chest tightness,  nor does he report any fevers, chills, night sweats, or symptoms of systemic illness.  Patient does endorse some symptoms of seasonal allergies.  He has an albuterol  inhaler but he has not been using it much.  Patient follows closely with his primary care physician where he is reported to have a history of CVA with residual right-sided deficit (left MCA territory), hypertension, and CAD status post CABG (x 3) as well as stent placement.  He has been followed by cardiology where an echocardiogram from January 2025 showed normal ejection fraction with mild, grade 1, diastolic dysfunction.  His RV function was noted to be normal as were his valves.  Given persistent symptoms, cardiac PET was ordered which was mildly abnormal with mild ischemia in the inferior wall.  He underwent both right and left heart caths on 05/19/2023 showing multivessel disease.  Patient had balloon angioplasty to in-stent restenosis within the vein  graft to OM2 with improvement.  His right heart cath showed a wedge pressure of 12 with an associated LVEDP of 20.  Patient denies any smoking history.  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  He then served in [employer] from ages 55 until 43 before retiring.  During his [employer], he was exposed to jet fuels and fumes.  He also reports some significant secondhand smoke exposure.  Ancillary information including prior medications, full medical/surgical/family/social histories, and PFTs (when available) are listed below and have been reviewed.   Review of Systems  Constitutional:  Negative for chills and fever.  Respiratory:  Positive for shortness of breath. Negative for cough.   Cardiovascular:  Negative for chest pain.     Objective:   Vitals:   06/20/23 1424  BP: 110/60  Pulse: 64  Temp: 98.3 F (36.8 C)  TempSrc: Oral  SpO2: 96%  Weight: 218 lb 6.4 oz (99.1 kg)  Height: 5' 8  (1.727 m)   96% on RA BMI Readings from Last 3 Encounters:  06/20/23 33.21 kg/m  05/27/23 33.54 kg/m  05/19/23 32.99 kg/m   Wt Readings from Last 3 Encounters:  06/20/23 218 lb 6.4 oz (99.1 kg)  05/27/23 220 lb 9.6 oz (100.1 kg)  05/19/23 217 lb (98.4 kg)    Physical Exam Constitutional:      Appearance: Normal appearance. He is obese.  Cardiovascular:     Rate and Rhythm: Normal rate and regular rhythm.     Pulses: Normal pulses.     Heart sounds: Normal heart sounds.  Pulmonary:     Effort: Pulmonary effort is normal.     Breath sounds: Normal breath sounds.  Neurological:     General: No focal deficit present.     Mental Status: He is alert. Mental status is at baseline.       Ancillary Information    Past Medical History:  Diagnosis Date   Anxiety    Arthritis    CAD (coronary artery disease) 2005   s/p CABG, DES   CHF (congestive heart failure) (HCC)    in EPIC care everywhere   Chronic kidney disease    COPD (chronic obstructive pulmonary disease) (HCC)    in EPIC care everywhere   Depression    Heart murmur    History of stroke    Hypothyroidism    OSA (obstructive sleep apnea)    Paroxysmal A-fib (HCC)    in EPIC careeverywhere     Family History  Problem Relation Age of Onset   Anxiety disorder Sister      Past Surgical History:  Procedure Laterality Date   APPENDECTOMY     BUNIONECTOMY     CATARACT EXTRACTION     Right eye   CATARACT EXTRACTION W/PHACO Left 01/19/2021   Procedure: CATARACT EXTRACTION PHACO AND INTRAOCULAR LENS PLACEMENT (IOC) LEFT 3.09 00:28.3;  Surgeon: Rosa College, MD;  Location: [facility];  Service: Ophthalmology;  Laterality: Left;   COLONOSCOPY  07/06/2004   CORONARY ARTERY BYPASS GRAFT  2005   CORONARY BALLOON ANGIOPLASTY N/A 05/19/2023   Procedure: CORONARY BALLOON ANGIOPLASTY;  Surgeon: Arleen Lacer, MD;  Location: [facility];  Service: Cardiovascular;  Laterality: N/A;   LOOP  RECORDER INSERTION N/A 07/25/2018   Procedure: LOOP RECORDER INSERTION;  Surgeon: Lei Pump, MD;  Location: [facility];  Service: Cardiovascular;  Laterality:  N/A;   RIGHT/LEFT HEART CATH AND CORONARY ANGIOGRAPHY Bilateral 05/19/2023   Procedure: RIGHT/LEFT HEART CATH AND CORONARY ANGIOGRAPHY;  Surgeon: Arleen Lacer, MD;  Location: [facility]VAS[facility];  Service: Cardiovascular;  Laterality: Bilateral;   TOTAL HIP ARTHROPLASTY Right 11/23/2011   TOTAL HIP ARTHROPLASTY Left 09/07/2011   TOTAL KNEE ARTHROPLASTY Right 02/15/2018   VASECTOMY  1978    Social History   Socioeconomic History   Marital status: Married    Spouse name: Not on file   Number of children: Not on file   Years of education: Not on file   Highest education level: Not on file  Occupational History   Occupation: retired  Tobacco Use   Smoking status: Never   Smokeless tobacco: Never  Vaping Use   Vaping status: Never Used  Substance and Sexual Activity   Alcohol use: Not Currently    Comment: past   Drug use: Never   Sexual activity: Not on file  Other Topics Concern   Not on file  Social History Narrative   Not on file   Social Drivers of Health   Financial Resource Strain: Low Risk  (01/07/2023)   Received from [facility]   Overall Financial Resource Strain (CARDIA)    Difficulty of Paying Living Expenses: Not very hard  Food Insecurity: No Food Insecurity (05/19/2023)   Hunger Vital Sign    Worried About Running Out of Food in the Last Year: Never true    Ran Out of Food in the Last Year: Never true  Transportation Needs: No Transportation Needs (05/19/2023)   PRAPARE - Administrator, Civil Service (Medical): No    Lack of Transportation (Non-Medical): No  Physical Activity: Sufficiently Active (10/22/2022)   Exercise Vital Sign    Days of Exercise per Week: 6 days    Minutes of Exercise per Session: 30 min  Stress: No Stress Concern Present  (10/22/2022)   [employer] of Occupational Health - Occupational Stress Questionnaire    Feeling of Stress : Not at all  Social Connections: Socially Integrated (05/19/2023)   Social Connection and Isolation Panel [NHANES]    Frequency of Communication with Friends and Family: More than three times a week    Frequency of Social Gatherings with Friends and Family: Three times a week    Attends Religious Services: More than 4 times per year    Active Member of Clubs or Organizations: Patient declined    Attends Banker Meetings: 1 to 4 times per year    Marital Status: Married  Catering manager Violence: Not At Risk (05/19/2023)   Humiliation, Afraid, Rape, and Kick questionnaire    Fear of Current or Ex-Partner: No    Emotionally Abused: No    Physically Abused: No    Sexually Abused: No     Allergies  Allergen Reactions   Pollen Extract Cough   Ace Inhibitors    Cephalexin     Vomiting and diarrhea   Crestor [Rosuvastatin Calcium ] Other (See Comments)    myalgia   Tape Rash    blistering     CBC    Component Value Date/Time   WBC 8.9 05/20/2023 0537   RBC 3.83 (L) 05/20/2023 0537   HGB 11.9 (L) 05/20/2023 0537   HGB 13.5 05/17/2023 1203   HCT 36.4 (L) 05/20/2023 0537   HCT 41.6 05/17/2023 1203   PLT 232 05/20/2023 0537   PLT 287 05/17/2023 1203   MCV 95.0 05/20/2023  0537   MCV 97 05/17/2023 1203   MCH 31.1 05/20/2023 0537   MCHC 32.7 05/20/2023 0537   RDW 13.3 05/20/2023 0537   RDW 13.1 05/17/2023 1203   LYMPHSABS 1.7 07/29/2022 1408   MONOABS 0.8 07/29/2022 1408   EOSABS 241 12/15/2022 1024   BASOSABS 80 12/15/2022 1024    Pulmonary Functions Testing Results:     No data to display          Outpatient Medications Prior to Visit  Medication Sig Dispense Refill   acetaminophen  (TYLENOL ) 650 MG CR tablet Take 1,300 mg by mouth at bedtime as needed for pain.     albuterol  (VENTOLIN  HFA) 108 (90 Base) MCG/ACT inhaler Inhale 1-2 puffs into  the lungs every 4 (four) hours as needed for wheezing or shortness of breath (cough). 1 each 3   aspirin  EC 81 MG tablet Take 81 mg by mouth daily.     atenolol  (TENORMIN ) 25 MG tablet Take 1 tablet (25 mg total) by mouth daily. 90 tablet 3   atorvastatin  (LIPITOR ) 80 MG tablet TAKE 1 TABLET(80 MG) BY MOUTH DAILY 90 tablet 1   Calcium  Carb-Cholecalciferol (CALCIUM  600 + D PO) Take 1 tablet by mouth daily.     Cholecalciferol (VITAMIN D) 50 MCG (2000 UT) tablet Take 2,000 Units by mouth daily.     clopidogrel  (PLAVIX ) 75 MG tablet Take 1 tablet (75 mg total) by mouth daily. 90 tablet 3   clotrimazole -betamethasone  (LOTRISONE ) cream Apply twice a day for 1-2 weeks, may repeat if need in future (Patient taking differently: as needed. Apply twice a day for 1-2 weeks, may repeat if need in future) 30 g 1   Coenzyme Q10 100 MG TABS Take 100 mg by mouth daily.     colchicine  0.6 MG tablet For acute gout flare take 2 pills at once, then take a 3rd pill 2 hours later. Then take 1 pill daily for 7-10 days or until resolved. (Patient taking differently: Take 0.6 mg by mouth daily as needed (For acute gout flare take 2 pills at once, then take a 3rd pill 2 hours later. Then take 1 pill daily for 7-10 days or until resolved.).) 30 tablet 2   diclofenac sodium (VOLTAREN) 1 % GEL Apply 2 g topically 4 (four) times daily as needed for pain. Foot pain     escitalopram  (LEXAPRO ) 20 MG tablet Take 1 tablet (20 mg total) by mouth daily. 90 tablet 3   furosemide  (LASIX ) 80 MG tablet TAKE 1 TABLET(80 MG) BY MOUTH TWICE DAILY 180 tablet 3   ipratropium (ATROVENT ) 0.06 % nasal spray Place 2 sprays into both nostrils 4 (four) times daily as needed for rhinitis. 15 mL 2   levothyroxine  (SYNTHROID ) 125 MCG tablet TAKE 1 TABLET(125 MCG) BY MOUTH DAILY BEFORE BREAKFAST 90 tablet 2   loratadine  (CLARITIN ) 10 MG tablet Take 10 mg by mouth daily.     LORazepam  (ATIVAN ) 0.5 MG tablet Take 1 tablet (0.5 mg total) by mouth daily as  needed for anxiety or sleep. 30 tablet 2   meclizine  (ANTIVERT ) 12.5 MG tablet Take 1 tablet (12.5 mg total) by mouth 3 (three) times daily as needed for dizziness. 30 tablet 1   montelukast  (SINGULAIR ) 10 MG tablet Take 1 tablet (10 mg total) by mouth at bedtime. 90 tablet 3   Multiple Vitamin (MULTIVITAMIN) tablet Take 1 tablet by mouth daily.     nitroGLYCERIN  (NITROSTAT ) 0.4 MG SL tablet Place 1 tablet (0.4 mg total) under the  tongue every 5 (five) minutes as needed for chest pain. 20 tablet 2   PATADAY 0.1 % ophthalmic solution Place 1 drop into both eyes daily as needed for allergies.     sennosides-docusate sodium  (SENOKOT-S) 8.6-50 MG tablet Take 2 tablets by mouth daily.     traZODone  (DESYREL ) 50 MG tablet Take 1-2 tablets (50-100 mg total) by mouth at bedtime. (Patient taking differently: Take 50 mg by mouth at bedtime.)     No facility-administered medications prior to visit.

## 2023-06-23 ENCOUNTER — Ambulatory Visit: Payer: Medicare PPO | Admitting: Physical Therapy

## 2023-06-28 ENCOUNTER — Other Ambulatory Visit

## 2023-06-28 DIAGNOSIS — I6930 Unspecified sequelae of cerebral infarction: Secondary | ICD-10-CM | POA: Diagnosis not present

## 2023-06-28 DIAGNOSIS — I10 Essential (primary) hypertension: Secondary | ICD-10-CM

## 2023-06-28 DIAGNOSIS — I25119 Atherosclerotic heart disease of native coronary artery with unspecified angina pectoris: Secondary | ICD-10-CM

## 2023-06-28 DIAGNOSIS — I5022 Chronic systolic (congestive) heart failure: Secondary | ICD-10-CM | POA: Diagnosis not present

## 2023-06-28 DIAGNOSIS — E039 Hypothyroidism, unspecified: Secondary | ICD-10-CM

## 2023-06-29 LAB — CBC WITH DIFFERENTIAL/PLATELET
Absolute Lymphocytes: 1485 {cells}/uL (ref 850–3900)
Absolute Monocytes: 487 {cells}/uL (ref 200–950)
Basophils Absolute: 81 {cells}/uL (ref 0–200)
Basophils Relative: 1.4 %
Eosinophils Absolute: 319 {cells}/uL (ref 15–500)
Eosinophils Relative: 5.5 %
HCT: 38.4 % — ABNORMAL LOW (ref 38.5–50.0)
Hemoglobin: 11.9 g/dL — ABNORMAL LOW (ref 13.2–17.1)
MCH: 30.6 pg (ref 27.0–33.0)
MCHC: 31 g/dL — ABNORMAL LOW (ref 32.0–36.0)
MCV: 98.7 fL (ref 80.0–100.0)
MPV: 8.7 fL (ref 7.5–12.5)
Monocytes Relative: 8.4 %
Neutro Abs: 3428 {cells}/uL (ref 1500–7800)
Neutrophils Relative %: 59.1 %
Platelets: 228 10*3/uL (ref 140–400)
RBC: 3.89 10*6/uL — ABNORMAL LOW (ref 4.20–5.80)
RDW: 13.1 % (ref 11.0–15.0)
Total Lymphocyte: 25.6 %
WBC: 5.8 10*3/uL (ref 3.8–10.8)

## 2023-06-29 LAB — LIPID PANEL
Cholesterol: 115 mg/dL (ref <200)
HDL: 41 mg/dL (ref >=40)
LDL Cholesterol (Calc): 59 mg/dL
Non-HDL Cholesterol (Calc): 74 mg/dL (ref <130)
Total CHOL/HDL Ratio: 2.8 (calc) (ref <5.0)
Triglycerides: 71 mg/dL (ref <150)

## 2023-06-29 LAB — COMPLETE METABOLIC PANEL WITHOUT GFR
AG Ratio: 1.3 (calc) (ref 1.0–2.5)
ALT: 14 U/L (ref 9–46)
AST: 16 U/L (ref 10–35)
Albumin: 3.8 g/dL (ref 3.6–5.1)
Alkaline phosphatase (APISO): 47 U/L (ref 35–144)
BUN: 16 mg/dL (ref 7–25)
CO2: 31 mmol/L (ref 20–32)
Calcium: 9 mg/dL (ref 8.6–10.3)
Chloride: 101 mmol/L (ref 98–110)
Creat: 0.91 mg/dL (ref 0.70–1.22)
Globulin: 2.9 g/dL (ref 1.9–3.7)
Glucose, Bld: 111 mg/dL — ABNORMAL HIGH (ref 65–99)
Potassium: 4.4 mmol/L (ref 3.5–5.3)
Sodium: 140 mmol/L (ref 135–146)
Total Bilirubin: 0.4 mg/dL (ref 0.2–1.2)
Total Protein: 6.7 g/dL (ref 6.1–8.1)

## 2023-06-29 LAB — T4, FREE: Free T4: 1.5 ng/dL (ref 0.8–1.8)

## 2023-06-30 ENCOUNTER — Ambulatory Visit: Payer: Medicare PPO | Admitting: Physical Therapy

## 2023-07-06 ENCOUNTER — Other Ambulatory Visit: Payer: Self-pay | Admitting: Family Medicine

## 2023-07-06 DIAGNOSIS — I25810 Atherosclerosis of coronary artery bypass graft(s) without angina pectoris: Secondary | ICD-10-CM

## 2023-07-06 DIAGNOSIS — J3089 Other allergic rhinitis: Secondary | ICD-10-CM

## 2023-07-06 DIAGNOSIS — F5104 Psychophysiologic insomnia: Secondary | ICD-10-CM

## 2023-07-07 ENCOUNTER — Ambulatory Visit: Payer: Medicare PPO | Admitting: Physical Therapy

## 2023-07-07 NOTE — Telephone Encounter (Signed)
 Requested medications are due for refill today.  yes  Requested medications are on the active medications list.  yes  Last refill. Lasix  06/08/2022 #180 3 rf, Trazodone  08/13/2022  Future visit scheduled.   yes  Notes to clinic.  Trazodone  was signed by [place] Missing lab for Lasix .    Requested Prescriptions  Pending Prescriptions Disp Refills   furosemide  (LASIX ) 80 MG tablet [Pharmacy Med Name: FUROSEMIDE  80MG  TABLETS] 180 tablet 3    Sig: TAKE 1 TABLET(80 MG) BY MOUTH TWICE DAILY     Cardiovascular:  Diuretics - Loop Failed - 07/07/2023  2:16 PM      Failed - Mg Level in normal range and within 180 days    Magnesium  Date Value Ref Range Status  07/24/2018 2.2 1.7 - 2.4 mg/dL Final    Comment:    Performed at [facility], [address] [place], [place] [address]         Failed - Valid encounter within last 6 months    Recent Outpatient Visits           3 months ago Coronary artery disease involving native coronary artery of native heart with angina pectoris [facility])   [facility] [place], Marsa PARAS, DO       Future Appointments             In 1 month Gollan, Timothy J, MD [facility] - K in normal range and within 180 days    Potassium  Date Value Ref Range Status  06/28/2023 4.4 3.5 - 5.3 mmol/L Final         Passed - Ca in normal range and within 180 days    Calcium   Date Value Ref Range Status  06/28/2023 9.0 8.6 - 10.3 mg/dL Final   Calcium , Ion  Date Value Ref Range Status  05/19/2023 1.14 (L) 1.15 - 1.40 mmol/L Final         Passed - Na in normal range and within 180 days    Sodium  Date Value Ref Range Status  06/28/2023 140 135 - 146 mmol/L Final  05/17/2023 142 134 - 144 mmol/L Final         Passed - Cr in normal range and within 180 days    Creat  Date Value Ref Range Status  06/28/2023 0.91 0.70 - 1.22 mg/dL Final         Passed - Cl in normal  range and within 180 days    Chloride  Date Value Ref Range Status  06/28/2023 101 98 - 110 mmol/L Final         Passed - Last BP in normal range    BP Readings from Last 1 Encounters:  06/20/23 110/60          traZODone  (DESYREL ) 50 MG tablet [Pharmacy Med Name: TRAZODONE  50MG  TABLETS] 90 tablet     Sig: TAKE 1 TABLET(50 MG) BY MOUTH AT BEDTIME     Psychiatry: Antidepressants - Serotonin Modulator Failed - 07/07/2023  2:16 PM      Failed - Valid encounter within last 6 months    Recent Outpatient Visits           3 months ago Coronary artery disease involving native coronary artery of native heart with angina pectoris [facility])   [facility] [place], Marsa PARAS, DO  Future Appointments             In 1 month Gollan, Evalene PARAS, MD [facility] - Completed PHQ-2 or PHQ-9 in the last 360 days      Signed Prescriptions Disp Refills   montelukast  (SINGULAIR ) 10 MG tablet 90 tablet 0    Sig: TAKE 1 TABLET(10 MG) BY MOUTH AT BEDTIME     Pulmonology:  Leukotriene Inhibitors Failed - 07/07/2023  2:16 PM      Failed - Valid encounter within last 12 months    Recent Outpatient Visits           3 months ago Coronary artery disease involving native coronary artery of native heart with angina pectoris [facility])   [place] [facility] [place] PARAS, DO       Future Appointments             In 1 month Gollan, Timothy J, MD [facility]

## 2023-07-07 NOTE — Telephone Encounter (Signed)
 Requested Prescriptions  Pending Prescriptions Disp Refills   furosemide  (LASIX ) 80 MG tablet [Pharmacy Med Name: FUROSEMIDE  80MG  TABLETS] 180 tablet 3    Sig: TAKE 1 TABLET(80 MG) BY MOUTH TWICE DAILY     Cardiovascular:  Diuretics - Loop Failed - 07/07/2023  2:15 PM      Failed - Mg Level in normal range and within 180 days    Magnesium  Date Value Ref Range Status  07/24/2018 2.2 1.7 - 2.4 mg/dL Final    Comment:    Performed at [facility], [address], [place], [place] [address]         Failed - Valid encounter within last 6 months    Recent Outpatient Visits           3 months ago Coronary artery disease involving native coronary artery of native heart with angina pectoris [facility])   [facility] [place], Marsa PARAS, DO       Future Appointments             In 1 month Gollan, Timothy J, [facility] - K in normal range and within 180 days    Potassium  Date Value Ref Range Status  06/28/2023 4.4 3.5 - 5.3 mmol/L Final         Passed - Ca in normal range and within 180 days    Calcium   Date Value Ref Range Status  06/28/2023 9.0 8.6 - 10.3 mg/dL Final   Calcium , Ion  Date Value Ref Range Status  05/19/2023 1.14 (L) 1.15 - 1.40 mmol/L Final         Passed - Na in normal range and within 180 days    Sodium  Date Value Ref Range Status  06/28/2023 140 135 - 146 mmol/L Final  05/17/2023 142 134 - 144 mmol/L Final         Passed - Cr in normal range and within 180 days    Creat  Date Value Ref Range Status  06/28/2023 0.91 0.70 - 1.22 mg/dL Final         Passed - Cl in normal range and within 180 days    Chloride  Date Value Ref Range Status  06/28/2023 101 98 - 110 mmol/L Final         Passed - Last BP in normal range    BP Readings from Last 1 Encounters:  06/20/23 110/60          traZODone  (DESYREL ) 50 MG tablet [Pharmacy Med Name: TRAZODONE  50MG  TABLETS] 90  tablet     Sig: TAKE 1 TABLET(50 MG) BY MOUTH AT BEDTIME     Psychiatry: Antidepressants - Serotonin Modulator Failed - 07/07/2023  2:15 PM      Failed - Valid encounter within last 6 months    Recent Outpatient Visits           3 months ago Coronary artery disease involving native coronary artery of native heart with angina pectoris [facility])   [place] [facility] [place] PARAS, DO       Future Appointments             In 1 month Gollan, Timothy J, MD [facility] [facility] - Completed PHQ-2 or PHQ-9 in the last 360 days  montelukast  (SINGULAIR ) 10 MG tablet [Pharmacy Med Name: MONTELUKAST  10MG  TABLETS] 90 tablet 1    Sig: TAKE 1 TABLET(10 MG) BY MOUTH AT BEDTIME     Pulmonology:  Leukotriene Inhibitors Failed - 07/07/2023  2:15 PM      Failed - Valid encounter within last 12 months    Recent Outpatient Visits           3 months ago Coronary artery disease involving native coronary artery of native heart with angina pectoris [facility])   [place] [facility] [place] PARAS, DO       Future Appointments             In 1 month Gollan, Timothy J, MD [facility]

## 2023-07-11 ENCOUNTER — Ambulatory Visit (INDEPENDENT_AMBULATORY_CARE_PROVIDER_SITE_OTHER): Admitting: Family Medicine

## 2023-07-11 ENCOUNTER — Encounter: Payer: Self-pay | Admitting: Family Medicine

## 2023-07-11 ENCOUNTER — Other Ambulatory Visit: Payer: Self-pay | Admitting: Family Medicine

## 2023-07-11 VITALS — BP 118/62 | HR 60 | Ht 68.0 in | Wt 225.4 lb

## 2023-07-11 DIAGNOSIS — I6930 Unspecified sequelae of cerebral infarction: Secondary | ICD-10-CM | POA: Diagnosis not present

## 2023-07-11 DIAGNOSIS — E039 Hypothyroidism, unspecified: Secondary | ICD-10-CM

## 2023-07-11 DIAGNOSIS — F419 Anxiety disorder, unspecified: Secondary | ICD-10-CM | POA: Diagnosis not present

## 2023-07-11 DIAGNOSIS — I25810 Atherosclerosis of coronary artery bypass graft(s) without angina pectoris: Secondary | ICD-10-CM | POA: Diagnosis not present

## 2023-07-11 DIAGNOSIS — I10 Essential (primary) hypertension: Secondary | ICD-10-CM

## 2023-07-11 DIAGNOSIS — R351 Nocturia: Secondary | ICD-10-CM

## 2023-07-11 DIAGNOSIS — F5104 Psychophysiologic insomnia: Secondary | ICD-10-CM | POA: Diagnosis not present

## 2023-07-11 DIAGNOSIS — N5089 Other specified disorders of the male genital organs: Secondary | ICD-10-CM

## 2023-07-11 DIAGNOSIS — I25118 Atherosclerotic heart disease of native coronary artery with other forms of angina pectoris: Secondary | ICD-10-CM

## 2023-07-11 DIAGNOSIS — R7303 Prediabetes: Secondary | ICD-10-CM

## 2023-07-11 DIAGNOSIS — Z0000 Encounter for general adult medical examination without abnormal findings: Secondary | ICD-10-CM

## 2023-07-11 DIAGNOSIS — N529 Male erectile dysfunction, unspecified: Secondary | ICD-10-CM

## 2023-07-11 MED ORDER — LEVOTHYROXINE SODIUM 125 MCG PO TABS: 125.0000 ug | ORAL_TABLET | Freq: Every day | ORAL | 1 refills | Status: DC

## 2023-07-11 MED ORDER — ESCITALOPRAM OXALATE 10 MG PO TABS
10.0000 mg | ORAL_TABLET | Freq: Every day | ORAL | 1 refills | Status: DC
Start: 2023-07-11 — End: 2023-09-29

## 2023-07-11 NOTE — Patient Instructions (Addendum)
 Thank you for coming to the office today.  Lower dose Escitalopram  Lexapro  from 20 to 10mg  daily New order  Reduce frequency of Furosemide  Lasix  80mg  daily now,and can take 2nd dose if need due to elevated fluid retention swelling or shortness of breath.  I will route this to cardiology as well.  Use AS NEEDED medications only if needed. Such as Colchicine , Lorazepam , contact us  if need re order.  ----------------------  We will check on status of referral to Urologist.  [facility] -1st floor [address] [place],  [place]  [address] Phone: [phone]   DUE for FASTING BLOOD WORK (no food or drink after midnight before the lab appointment, only water or coffee without cream/sugar on the morning of)  SCHEDULE Lab Only visit in the morning at the clinic for lab draw in 6 MONTHS   - Make sure Lab Only appointment is at about 1 week before your next appointment, so that results will be available  For Lab Results, once available within 2-3 days of blood draw, you can can log in to MyChart online to view your results and a brief explanation. Also, we can discuss results at next follow-up visit.   Please schedule a Follow-up Appointment to: Return for 6 month fasting lab > 1 week later Annual Physical.  If you have any other questions or concerns, please feel free to call the office or send a message through MyChart. You may also schedule an earlier appointment if necessary.  Additionally, you may be receiving a survey about your experience at our office within a few days to 1 week by e-mail or mail. We value your feedback.  Marsa Officer, DO [facility], [place]

## 2023-07-11 NOTE — Progress Notes (Signed)
 Subjective:    Patient ID: Luke Mckee, male    DOB: 04-30-1940, 83 y.o.   MRN: [id number]  Luke Mckee is a 83 y.o. male presenting on 07/11/2023 for No chief complaint on file.   HPI  Discussed the use of AI scribe software for clinical note transcription with the patient, who gave verbal consent to proceed.  History of Present Illness   Luke Mckee is an 82 year old male with hypertension and coronary artery disease who presents for follow-up results and medication updates.  Hypertension and diuretic therapy Chronic Diastolic Heart Failure - Currently taking atenolol  and furosemide  80 mg twice daily. - Experiencing inconvenience from frequent urination, particularly during church services, and interested in reducing furosemide  dose. Often he has not had fluid retention or abnormal weight gain feels better without taking this med. - Caregiver confirms compliance with atenolol . - No chest pain, shortness of breath, or palpitations.  Coronary artery disease and antiplatelet therapy - Currently taking baby aspirin , atorvastatin , and clopidogrel . - Caregiver confirms compliance with aspirin  and clopidogrel . - Cholesterol is well controlled on current regimen.  Electrolyte and renal function - Recent laboratory results show normal kidney and liver function. - Sodium and potassium levels have improved.  Hematologic status - Hemoglobin is 11.9, stable for several months.  Glycemic control - Blood glucose levels consistently range from 110 to 118 mg/dL. - Most recent hemoglobin A1c is 6.1, the best result in four years.  Weight and nutritional status - Recent weight gain of approximately 10 pounds, current weight 223 pounds (previous low 213 pounds). - Caregiver describes a well-rounded diet, making dietary deficiency unlikely.  Psychiatric symptoms and medication use - Currently taking Lexapro , with consideration of dose reduction due to less agitation when doses are  missed. - Lorazepam  used as needed for sleep and anxiety, but not used frequently.  Use of as-needed medications - Colchicine  used only once since last hospitalization. - Meclizine  not used for a long time.     History of CVA with residual deficit R sided weakness / multiple L MCA / TIA history HYPERTENSION Hyperlipidemia CAD s/p CABG with stable angina on nitrate  Followed by Neuro/Cards, see prior notes for background information. S/p loop recorder. No identified AFib. Denies any chest pain, new focal weakness, dyspnea, worsening edema, near syncope or syncope   Elevated A1c / PreDM   Hypothyroidism Chronic problem Last lab now 12/2022 showed normal Thyroid  panel and T4 He is taking Levothyroxine  125mcg daily, currently doing well - with some improvement after med adjustment of changing dosing so does not take other meds with levothyroxine .   Psychological Insomnia Failed Ambien Improved on Trazodone  nightly.   OSA on CPAP Followed by Pulmonology, no repeat sleep study - Today reports that sleep apnea is well controlled. He uses the CPAP machine every night. Tolerates the machine well, and thinks that sleeps better with it and feels good. No new concerns or symptoms.       07/11/2023    2:44 PM 05/10/2023    3:00 PM 04/19/2023    4:50 PM  Depression screen PHQ 2/9  Decreased Interest 0 0 0  Down, Depressed, Hopeless 1 0 1  PHQ - 2 Score 1 0 1  Altered sleeping 0    Tired, decreased energy 0    Change in appetite 3    Feeling bad or failure about yourself  2    Trouble concentrating 0    Moving slowly or fidgety/restless 1  Suicidal thoughts 0    PHQ-9 Score 7    Difficult doing work/chores Somewhat difficult         07/11/2023    2:45 PM 05/10/2023    3:00 PM 03/25/2023    2:08 PM 06/23/2022    2:50 PM  GAD 7 : Generalized Anxiety Score  Nervous, Anxious, on Edge 0 1 1 1   Control/stop worrying 0 1  1  Worry too much - different things 1 0 1 0  Trouble  relaxing 0 0  0  Restless 2 0  0  Easily annoyed or irritable 1 1  1   Afraid - awful might happen 0 1  0  Total GAD 7 Score 4 4  3   Anxiety Difficulty Somewhat difficult Somewhat difficult       Past Medical History:  Diagnosis Date   Anxiety    Arthritis    CAD (coronary artery disease) 2005   s/p CABG, DES   CHF (congestive heart failure) (HCC)    in EPIC care everywhere   Chronic kidney disease    COPD (chronic obstructive pulmonary disease) (HCC)    in EPIC care everywhere   Depression    Heart murmur    History of stroke    Hypothyroidism    OSA (obstructive sleep apnea)    Paroxysmal A-fib (HCC)    in EPIC careeverywhere   Past Surgical History:  Procedure Laterality Date   APPENDECTOMY     BUNIONECTOMY     CATARACT EXTRACTION     Right eye   CATARACT EXTRACTION W/PHACO Left 01/19/2021   Procedure: CATARACT EXTRACTION PHACO AND INTRAOCULAR LENS PLACEMENT (IOC) LEFT 3.09 00:28.3;  Surgeon: Myrna Adine Anes, MD;  [facility];  Service: Ophthalmology;  Laterality: Left;   COLONOSCOPY  07/06/2004   CORONARY ARTERY BYPASS GRAFT  2005   CORONARY BALLOON ANGIOPLASTY N/A 05/19/2023   Procedure: CORONARY BALLOON ANGIOPLASTY;  Surgeon: Anner Alm ORN, MD;  Location: [facility] INVASIVE CV LAB;  Service: Cardiovascular;  Laterality: N/A;   LOOP RECORDER INSERTION N/A 07/25/2018   Procedure: LOOP RECORDER INSERTION;  Surgeon: Inocencio Soyla Lunger, MD;  Location: [facility] INVASIVE CV LAB;  Service: Cardiovascular;  Laterality: N/A;   RIGHT/LEFT HEART CATH AND CORONARY ANGIOGRAPHY Bilateral 05/19/2023   Procedure: RIGHT/LEFT HEART CATH AND CORONARY ANGIOGRAPHY;  Surgeon: Anner Alm ORN, MD;  Location: [facility] INVASIVE CV LAB;  Service: Cardiovascular;  Laterality: Bilateral;   TOTAL HIP ARTHROPLASTY Right 11/23/2011   TOTAL HIP ARTHROPLASTY Left 09/07/2011   TOTAL KNEE ARTHROPLASTY Right 02/15/2018   VASECTOMY  1978   Social History   Socioeconomic History   Marital  status: Married    Spouse name: Not on file   Number of children: Not on file   Years of education: Not on file   Highest education level: Not on file  Occupational History   Occupation: retired  Tobacco Use   Smoking status: Never   Smokeless tobacco: Never  Vaping Use   Vaping status: Never Used  Substance and Sexual Activity   Alcohol use: Not Currently    Comment: past   Drug use: Never   Sexual activity: Not on file  Other Topics Concern   Not on file  Social History Narrative   Not on file   Social Drivers of Health   Financial Resource Strain: Low Risk  (01/07/2023)   Received from [facility]   Overall Financial Resource Strain (CARDIA)    Difficulty of Paying Living Expenses:  Not very hard  Food Insecurity: No Food Insecurity (05/19/2023)   Hunger Vital Sign    Worried About Running Out of Food in the Last Year: Never true    Ran Out of Food in the Last Year: Never true  Transportation Needs: No Transportation Needs (05/19/2023)   PRAPARE - Administrator, Civil Service (Medical): No    Lack of Transportation (Non-Medical): No  Physical Activity: Sufficiently Active (10/22/2022)   Exercise Vital Sign    Days of Exercise per Week: 6 days    Minutes of Exercise per Session: 30 min  Stress: No Stress Concern Present (10/22/2022)   [employer] of Occupational Health - Occupational Stress Questionnaire    Feeling of Stress : Not at all  Social Connections: Socially Integrated (05/19/2023)   Social Connection and Isolation Panel    Frequency of Communication with Friends and Family: More than three times a week    Frequency of Social Gatherings with Friends and Family: Three times a week    Attends Religious Services: More than 4 times per year    Active Member of Clubs or Organizations: Patient declined    Attends Banker Meetings: 1 to 4 times per year    Marital Status: Married  Catering manager Violence: Not At Risk  (05/19/2023)   Humiliation, Afraid, Rape, and Kick questionnaire    Fear of Current or Ex-Partner: No    Emotionally Abused: No    Physically Abused: No    Sexually Abused: No   Family History  Problem Relation Age of Onset   Anxiety disorder Sister    Current Outpatient Medications on File Prior to Visit  Medication Sig   acetaminophen  (TYLENOL ) 650 MG CR tablet Take 1,300 mg by mouth at bedtime as needed for pain.   albuterol  (VENTOLIN  HFA) 108 (90 Base) MCG/ACT inhaler Inhale 1-2 puffs into the lungs every 4 (four) hours as needed for wheezing or shortness of breath (cough).   aspirin  EC 81 MG tablet Take 81 mg by mouth daily.   atenolol  (TENORMIN ) 25 MG tablet Take 1 tablet (25 mg total) by mouth daily.   atorvastatin  (LIPITOR ) 80 MG tablet TAKE 1 TABLET(80 MG) BY MOUTH DAILY   Calcium  Carb-Cholecalciferol (CALCIUM  600 + D PO) Take 1 tablet by mouth daily.   Cholecalciferol (VITAMIN D) 50 MCG (2000 UT) tablet Take 2,000 Units by mouth daily.   clopidogrel  (PLAVIX ) 75 MG tablet Take 1 tablet (75 mg total) by mouth daily.   Coenzyme Q10 100 MG TABS Take 100 mg by mouth daily.   diclofenac sodium (VOLTAREN) 1 % GEL Apply 2 g topically 4 (four) times daily as needed for pain. Foot pain   ipratropium (ATROVENT ) 0.06 % nasal spray Place 2 sprays into both nostrils 4 (four) times daily as needed for rhinitis.   loratadine  (CLARITIN ) 10 MG tablet Take 10 mg by mouth daily.   meclizine  (ANTIVERT ) 12.5 MG tablet Take 1 tablet (12.5 mg total) by mouth 3 (three) times daily as needed for dizziness.   montelukast  (SINGULAIR ) 10 MG tablet TAKE 1 TABLET(10 MG) BY MOUTH AT BEDTIME   Multiple Vitamin (MULTIVITAMIN) tablet Take 1 tablet by mouth daily.   nitroGLYCERIN  (NITROSTAT ) 0.4 MG SL tablet Place 1 tablet (0.4 mg total) under the tongue every 5 (five) minutes as needed for chest pain.   PATADAY 0.1 % ophthalmic solution Place 1 drop into both eyes daily as needed for allergies.    sennosides-docusate sodium  (SENOKOT-S) 8.6-50 MG  tablet Take 2 tablets by mouth daily.   traZODone  (DESYREL ) 50 MG tablet TAKE 1 TABLET(50 MG) BY MOUTH AT BEDTIME   clotrimazole -betamethasone  (LOTRISONE ) cream Apply twice a day for 1-2 weeks, may repeat if need in future (Patient not taking: Reported on 07/11/2023)   colchicine  0.6 MG tablet For acute gout flare take 2 pills at once, then take a 3rd pill 2 hours later. Then take 1 pill daily for 7-10 days or until resolved. (Patient not taking: Reported on 07/11/2023)   furosemide  (LASIX ) 80 MG tablet Take 1 tablet (80 mg total) by mouth daily. May take 2nd dose AS NEEDED for fluid retention.   No current facility-administered medications on file prior to visit.    Review of Systems  Constitutional:  Negative for activity change, appetite change, chills, diaphoresis, fatigue and fever.  HENT:  Negative for congestion and hearing loss.   Eyes:  Negative for visual disturbance.  Respiratory:  Negative for cough, chest tightness, shortness of breath and wheezing.   Cardiovascular:  Negative for chest pain, palpitations and leg swelling.  Gastrointestinal:  Negative for abdominal pain, constipation, diarrhea, nausea and vomiting.  Genitourinary:  Negative for dysuria, frequency and hematuria.  Musculoskeletal:  Negative for arthralgias and neck pain.  Skin:  Negative for rash.  Neurological:  Negative for dizziness, weakness, light-headedness, numbness and headaches.  Hematological:  Negative for adenopathy.  Psychiatric/Behavioral:  Negative for behavioral problems, dysphoric mood and sleep disturbance.    Per HPI unless specifically indicated above     Objective:    BP 118/62 (BP Location: Right Arm, Patient Position: Sitting, Cuff Size: Normal)   Pulse 60   Ht 5' 8 (1.727 m)   Wt 225 lb 6.4 oz (102.2 kg)   SpO2 94%   BMI 34.27 kg/m   Wt Readings from Last 3 Encounters:  07/11/23 225 lb 6.4 oz (102.2 kg)  06/20/23 218 lb 6.4 oz  (99.1 kg)  05/27/23 220 lb 9.6 oz (100.1 kg)    Physical Exam Vitals and nursing note reviewed.  Constitutional:      General: He is not in acute distress.    Appearance: He is well-developed. He is obese. He is not diaphoretic.     Comments: Well-appearing, comfortable, cooperative  HENT:     Head: Normocephalic and atraumatic.   Eyes:     General:        Right eye: No discharge.        Left eye: No discharge.     Conjunctiva/sclera: Conjunctivae normal.     Pupils: Pupils are equal, round, and reactive to light.   Neck:     Thyroid : No thyromegaly.   Cardiovascular:     Rate and Rhythm: Normal rate and regular rhythm.     Pulses: Normal pulses.     Heart sounds: Normal heart sounds. No murmur heard. Pulmonary:     Effort: Pulmonary effort is normal. No respiratory distress.     Breath sounds: Normal breath sounds. No wheezing or rales.  Abdominal:     General: Bowel sounds are normal. There is no distension.     Palpations: Abdomen is soft. There is no mass.     Tenderness: There is no abdominal tenderness.   Musculoskeletal:        General: No tenderness. Normal range of motion.     Cervical back: Normal range of motion and neck supple.     Right lower leg: No edema.     Left lower leg: No  edema.     Comments: Upper / Lower Extremities: - Normal muscle tone, strength bilateral upper extremities 5/5, lower extremities 5/5  Lymphadenopathy:     Cervical: No cervical adenopathy.   Skin:    General: Skin is warm and dry.     Findings: No erythema or rash.   Neurological:     Mental Status: He is alert and oriented to person, place, and time.     Comments: Distal sensation intact to light touch all extremities  Psychiatric:        Mood and Affect: Mood normal.        Behavior: Behavior normal.        Thought Content: Thought content normal.     Comments: Well groomed, good eye contact, normal speech and thoughts     Results for orders placed or performed in  visit on 06/28/23  T4, free   Collection Time: 06/28/23 10:27 AM  Result Value Ref Range   Free T4 1.5 0.8 - 1.8 ng/dL  CBC with Differential/Platelet   Collection Time: 06/28/23 10:27 AM  Result Value Ref Range   WBC 5.8 3.8 - 10.8 Thousand/uL   RBC 3.89 (L) 4.20 - 5.80 Million/uL   Hemoglobin 11.9 (L) 13.2 - 17.1 g/dL   HCT 61.5 (L) 61.4 - 49.9 %   MCV 98.7 80.0 - 100.0 fL   MCH 30.6 27.0 - 33.0 pg   MCHC 31.0 (L) 32.0 - 36.0 g/dL   RDW 86.8 88.9 - 84.9 %   Platelets 228 140 - 400 Thousand/uL   MPV 8.7 7.5 - 12.5 fL   Neutro Abs 3,428 1,500 - 7,800 cells/uL   Absolute Lymphocytes 1,485 850 - 3,900 cells/uL   Absolute Monocytes 487 200 - 950 cells/uL   Eosinophils Absolute 319 15 - 500 cells/uL   Basophils Absolute 81 0 - 200 cells/uL   Neutrophils Relative % 59.1 %   Total Lymphocyte 25.6 %   Monocytes Relative 8.4 %   Eosinophils Relative 5.5 %   Basophils Relative 1.4 %  COMPLETE METABOLIC PANEL WITH GFR   Collection Time: 06/28/23 10:27 AM  Result Value Ref Range   Glucose, Bld 111 (H) 65 - 99 mg/dL   BUN 16 7 - 25 mg/dL   Creat 9.08 9.29 - 8.77 mg/dL   BUN/Creatinine Ratio SEE NOTE: 6 - 22 (calc)   Sodium 140 135 - 146 mmol/L   Potassium 4.4 3.5 - 5.3 mmol/L   Chloride 101 98 - 110 mmol/L   CO2 31 20 - 32 mmol/L   Calcium  9.0 8.6 - 10.3 mg/dL   Total Protein 6.7 6.1 - 8.1 g/dL   Albumin 3.8 3.6 - 5.1 g/dL   Globulin 2.9 1.9 - 3.7 g/dL (calc)   AG Ratio 1.3 1.0 - 2.5 (calc)   Total Bilirubin 0.4 0.2 - 1.2 mg/dL   Alkaline phosphatase (APISO) 47 35 - 144 U/L   AST 16 10 - 35 U/L   ALT 14 9 - 46 U/L  Lipid panel   Collection Time: 06/28/23 10:27 AM  Result Value Ref Range   Cholesterol 115 <200 mg/dL   HDL 41 > OR = 40 mg/dL   Triglycerides 71 <849 mg/dL   LDL Cholesterol (Calc) 59 mg/dL (calc)   Total CHOL/HDL Ratio 2.8 <5.0 (calc)   Non-HDL Cholesterol (Calc) 74 <869 mg/dL (calc)      Assessment & Plan:   Problem List Items Addressed This Visit      Anxiety   Relevant  Medications   escitalopram  (LEXAPRO ) 10 MG tablet   Atherosclerosis of native coronary artery of native heart with stable angina pectoris (HCC)   Relevant Medications   furosemide  (LASIX ) 80 MG tablet   escitalopram  (LEXAPRO ) 10 MG tablet   CAD (coronary artery disease)   Relevant Medications   furosemide  (LASIX ) 80 MG tablet   History of cerebrovascular accident (CVA) with residual deficit - Primary   Hypothyroidism   Relevant Medications   levothyroxine  (SYNTHROID ) 125 MCG tablet   Other Visit Diagnoses       Psychophysiologic insomnia       Relevant Medications   escitalopram  (LEXAPRO ) 10 MG tablet        Updated Health Maintenance information Reviewed recent lab results with patient Encouraged improvement to lifestyle with diet and exercise Goal of weight loss  Medication Management Discussed management of multiple medications, particularly PRN medications. Emphasized continuing necessary medications and reducing PRN medication burden. - Continue atenolol , aspirin , clopidogrel , atorvastatin . - Use PRN medications like colchicine  and lorazepam  only if needed. - Do not reorder PRN medications unless requested.  Furosemide  Management Currently on furosemide  80 mg twice daily, managing with reduced dose. Discussed reducing dose to once daily with option for second dose based on symptoms. - Reduce furosemide  to 80 mg once daily with option for second dose if needed. - Monitor weight and symptoms of fluid retention closely. - Inform cardiologist of changes in furosemide  dosing.  Anxiety / Mood Lexapro  (Escitalopram ) Management Reports feeling less agitated when missing doses. Discussed reducing dose to assess symptom improvement. - Reduce Lexapro  dose from 20 mg to 10 mg daily. - Monitor for changes in mood and agitation.  Anemia Hemoglobin level low at 11.9, consistent with mild anemia. Cause unclear, not severe enough for treatment.   General  Health Maintenance Up to date on vaccinations. Lab results show well-controlled cholesterol, normal renal and hepatic function, and well-managed blood glucose. Working on [employer]. - Encourage weight management efforts. - Schedule pulmonary function test for next month.  Follow-up Scheduled for follow-up in six months. Emphasized coordinating care with cardiologist and monitoring lab results. - Schedule follow-up appointment in six months. - Coordinate care with cardiologist regarding furosemide  dosing. - Monitor lab results, including A1c, in six months.       Reduce Lasix  to 80mg  daily, and then consider 2nd dose as AS NEEDED only. He has done this for period of time and doing well. He prefers to take it less often. Due to urinary frequency and feels it is not always needed. Route chart to [facility]  Route chart to [facility] for follow up on Urology referral from 03/2023 not scheduled yet.  No orders of the defined types were placed in this encounter.   Meds ordered this encounter  Medications   escitalopram  (LEXAPRO ) 10 MG tablet    Sig: Take 1 tablet (10 mg total) by mouth daily.    Dispense:  90 tablet    Refill:  1    Dose reduction from 20 to 10mg  daily   levothyroxine  (SYNTHROID ) 125 MCG tablet    Sig: Take 1 tablet (125 mcg total) by mouth daily before breakfast.    Dispense:  90 tablet    Refill:  1     Follow up plan: Return for 6 month fasting lab > 1 week later Annual Physical.  Marsa Officer, DO [facility] 07/11/2023, 2:46 PM

## 2023-07-14 ENCOUNTER — Ambulatory Visit: Payer: Medicare PPO | Admitting: Physical Therapy

## 2023-07-14 ENCOUNTER — Telehealth: Payer: Self-pay

## 2023-07-19 NOTE — Addendum Note (Signed)
 Addended by: EDMAN MARSA PARAS on: 07/19/2023 05:06 PM   Modules accepted: Orders

## 2023-07-21 ENCOUNTER — Ambulatory Visit: Payer: Medicare PPO | Admitting: Physical Therapy

## 2023-07-21 DIAGNOSIS — H43813 Vitreous degeneration, bilateral: Secondary | ICD-10-CM | POA: Diagnosis not present

## 2023-07-21 DIAGNOSIS — Z961 Presence of intraocular lens: Secondary | ICD-10-CM | POA: Diagnosis not present

## 2023-07-21 DIAGNOSIS — H35071 Retinal telangiectasis, right eye: Secondary | ICD-10-CM | POA: Diagnosis not present

## 2023-07-25 ENCOUNTER — Other Ambulatory Visit: Admitting: Pharmacist

## 2023-07-25 DIAGNOSIS — I25810 Atherosclerosis of coronary artery bypass graft(s) without angina pectoris: Secondary | ICD-10-CM

## 2023-07-25 DIAGNOSIS — G4733 Obstructive sleep apnea (adult) (pediatric): Secondary | ICD-10-CM | POA: Diagnosis not present

## 2023-07-25 DIAGNOSIS — I10 Essential (primary) hypertension: Secondary | ICD-10-CM

## 2023-07-25 NOTE — Progress Notes (Signed)
 07/25/2023 Name: Luke Mckee MRN: [id number] DOB: 1940-06-22  Chief Complaint  Patient presents with   Medication Management   Medication Adherence    Luke Mckee is a 83 y.o. year old male who presented for a telephone visit.   They were referred to the pharmacist by their PCP for assistance in managing complex medication management and medication adherence     Subjective:   Care Team: Primary Care Provider: Edman Marsa PARAS, DO ; Next Scheduled Visit: 01/17/2024 Cardiologist: Gollan, Timothy J, MD; Next Schedule Visit: 08/29/2023 Neurologist: Jonette Lauraine Norris, PA Nurse Care Manager: Karoline Lima, RN; Next Scheduled Visit: 08/23/2023 Pulmonologist: Isadora Hose, MD; Next Scheduled Visit: 07/27/2023    Medication Access/Adherence  Current Pharmacy:  [facility] [id number] [facility], [place] - [address] AT [facility] [address] [place] [address] Phone: [phone] Fax: [phone]  [facility] [address] [place] [address] Phone: [phone] Fax: [phone]   Patient reports affordability concerns with their medications: No  Patient reports access/transportation concerns to their pharmacy: No  Patient reports adherence concerns with their medications:  No     Uses weekly pillbox to organize patient's medications   [name] wife manages patient's medications using weekly pillbox. Note previously discussed option to switch to pill packaging to aid with adherence/decrease burden.     Hypertension:   Current medications:  - atenolol  25 mg daily in morning - furosemide  80 mg - reports taking once daily, but up to twice daily as needed   Patient has an automated, upper arm home BP cuff Recalls last checked blood pressure last week with reading ~123/62   Denies signs of hypotension, such as dizziness recently   Denies signs of swelling today   Current  Physical Activity: continuing to do exercises from PT and staying active around the home     Objective:  Lab Results  Component Value Date   HGBA1C 6.1 (H) 12/15/2022    Lab Results  Component Value Date   CREATININE 0.91 06/28/2023   BUN 16 06/28/2023   NA 140 06/28/2023   K 4.4 06/28/2023   CL 101 06/28/2023   CO2 31 06/28/2023    Lab Results  Component Value Date   CHOL 115 06/28/2023   HDL 41 06/28/2023   LDLCALC 59 06/28/2023   TRIG 71 06/28/2023   CHOLHDL 2.8 06/28/2023   BP Readings from Last 3 Encounters:  07/11/23 118/62  06/20/23 110/60  05/27/23 (!) 107/59   Pulse Readings from Last 3 Encounters:  07/11/23 60  06/20/23 64  05/27/23 64     Medications Reviewed Today     Reviewed by Alana Sharyle LABOR, RPH-CPP (Pharmacist) on 07/25/23 at 1212  Med List Status: <None>   Medication Order Taking? Sig Documenting Provider Last Dose Status Informant  acetaminophen  (TYLENOL ) 650 MG CR tablet [id number]  Take 1,300 mg by mouth at bedtime as needed for pain. [provider]  Active Spouse/Significant Other  albuterol  (VENTOLIN  HFA) 108 (90 Base) MCG/ACT inhaler [id number]  Inhale 1-2 puffs into the lungs every 4 (four) hours as needed for wheezing or shortness of breath (cough). Edman Marsa PARAS, DO  Active Spouse/Significant Other           Med Note MALKA LYNWOOD CHRISTELLA [name] 19, 2023 12:53 PM) > 1 month since last used  aspirin  EC 81 MG tablet [id number]  Take 81  mg by mouth daily. [provider]  Active Spouse/Significant Other  atenolol  (TENORMIN ) 25 MG tablet [id number] Yes Take 1 tablet (25 mg total) by mouth daily. Edman Marsa PARAS, DO  Active Spouse/Significant Other           Med Note HALLIE, FELECIA N   Wed Feb 16, 2023 11:27 AM) Reports taking in the morning as advised by Cardiology team  atorvastatin  (LIPITOR ) 80 MG tablet [id number]  TAKE 1 TABLET(80 MG) BY MOUTH DAILY Edman, Marsa PARAS, DO  Active   Calcium   Carb-Cholecalciferol (CALCIUM  600 + D PO) [id number]  Take 1 tablet by mouth daily. [provider]  Active Spouse/Significant Other  Cholecalciferol (VITAMIN D) 50 MCG (2000 UT) tablet [id number]  Take 2,000 Units by mouth daily. [provider]  Active Spouse/Significant Other  clopidogrel  (PLAVIX ) 75 MG tablet [id number]  Take 1 tablet (75 mg total) by mouth daily. Vivienne Lonni Ingle, NP  Active   clotrimazole -betamethasone  (LOTRISONE ) cream [id number]  Apply twice a day for 1-2 weeks, may repeat if need in future  Patient not taking: Reported on 07/11/2023   Edman Marsa PARAS, DO  Active Spouse/Significant Other           Med Note MALKA LYNWOOD HERO   Thu May 19, 2023  1:17 PM) > 1 month since last used    Coenzyme Q10 100 MG TABS [id number]  Take 100 mg by mouth daily. [provider]  Active Spouse/Significant Other  colchicine  0.6 MG tablet [id number]  For acute gout flare take 2 pills at once, then take a 3rd pill 2 hours later. Then take 1 pill daily for 7-10 days or until resolved.  Patient not taking: Reported on 07/11/2023   Edman Marsa PARAS, DO  Active Spouse/Significant Other           Med Note MALKA LYNWOOD HERO   Thu May 19, 2023  1:18 PM) > 1 month since last used  diclofenac sodium (VOLTAREN) 1 % GEL [id number] Yes Apply 2 g topically 4 (four) times daily as needed for pain. Foot pain [provider]  Active Spouse/Significant Other           Med Note MALKA LYNWOOD HERO   Thu May 19, 2023  1:18 PM) > 1 month since last used  escitalopram  (LEXAPRO ) 10 MG tablet [id number] Yes Take 1 tablet (10 mg total) by mouth daily. Edman Marsa PARAS, DO  Active   furosemide  (LASIX ) 80 MG tablet [id number] Yes Take 1 tablet (80 mg total) by mouth daily. May take 2nd dose AS NEEDED for fluid retention. Edman Marsa PARAS, DO  Active   ipratropium (ATROVENT ) 0.06 % nasal spray [id number]  Place 2 sprays into both nostrils 4 (four) times daily  as needed for rhinitis. Edman Marsa PARAS, DO  Active Spouse/Significant Other           Med Note MALKA LYNWOOD HERO [name] 19, 2023  1:19 PM) > 1 month since last used  levothyroxine  (SYNTHROID ) 125 MCG tablet [id number]  Take 1 tablet (125 mcg total) by mouth daily before breakfast. Edman Marsa PARAS, DO  Active   loratadine  (CLARITIN ) 10 MG tablet [id number]  Take 10 mg by mouth daily. [provider]  Active Spouse/Significant Other  meclizine  (ANTIVERT ) 12.5 MG tablet [id number]  Take 1 tablet (12.5 mg total) by mouth 3 (three) times daily as needed for dizziness.  Patient not taking: Reported on 07/25/2023   Edman Marsa PARAS, DO  Active Spouse/Significant  Other           Med Note MALKA LYNWOOD HERO   Thu May 19, 2023  1:21 PM) > 1 month since last used  montelukast  (SINGULAIR ) 10 MG tablet [id number] Yes TAKE 1 TABLET(10 MG) BY MOUTH AT BEDTIME Edman Marsa PARAS, DO  Active   Multiple Vitamin (MULTIVITAMIN) tablet [id number]  Take 1 tablet by mouth daily. [provider]  Active Spouse/Significant Other  nitroGLYCERIN  (NITROSTAT ) 0.4 MG SL tablet [id number]  Place 1 tablet (0.4 mg total) under the tongue every 5 (five) minutes as needed for chest pain. Edman Marsa PARAS, DO  Active Spouse/Significant Other           Med Note JACKOLYN, ZEA J   Mon Jun 20, 2023  2:22 PM) Prn  PATADAY 0.1 % ophthalmic solution [id number]  Place 1 drop into both eyes daily as needed for allergies. [provider]  Active Spouse/Significant Other  sennosides-docusate sodium  (SENOKOT-S) 8.6-50 MG tablet [id number]  Take 2 tablets by mouth daily. [provider]  Active Spouse/Significant Other  traZODone  (DESYREL ) 50 MG tablet [id number] Yes TAKE 1 TABLET(50 MG) BY MOUTH AT BEDTIME Karamalegos, Marsa PARAS, DO  Active               Assessment/Plan:   Identify patient in need of refill of atorvastatin . Will contact pharmacy for refill    Hypertension: - Reviewed long term cardiovascular and renal outcomes of uncontrolled blood pressure - Remind patient to continue to take positional changes slowly - Recommend to monitor home blood pressure, keep log of results and have this record to review at upcoming medical appointments. Patient to contact provider office sooner if needed for readings outside of established parameters or symptoms     Medication Management: - Have reviewed local pharmacies with adherence packaging options.  Note patient has both [employer] and [employer] prescription coverage. Per review of pharmacy list from [employer], appears that [facility] is in-network At this time, patient using weekly pillbox and plans to continue Encourage to fill just once weekly and encourage to use medication list from latest office visit to aid with adherence Have provided patient/spouse with contact information for Sam at [facility] [phone]) if interested in considering in future     Follow Up Plan: Clinical Pharmacist will follow up with patient by telephone on 10/24/2023 at 11:30 AM      Sharyle Sia, PharmD, [name], CPP Clinical Pharmacist [facility] [phone]

## 2023-07-25 NOTE — Patient Instructions (Signed)
 Goals Addressed             This Visit's Progress    Pharmacy Goals       Check your blood pressure twice weekly, and any time you have concerning symptoms like headache, chest pain, dizziness, shortness of breath, or vision changes.   To appropriately check your blood pressure, make sure you do the following:  1) Avoid caffeine, exercise, or tobacco products for 30 minutes before checking. Empty your bladder. 2) Sit with your back supported in a flat-backed chair. Rest your arm on something flat (arm of the chair, table, etc). 3) Sit still with your feet flat on the floor, resting, for at least 5 minutes.  4) Check your blood pressure. Take 1-2 readings.  5) Write down these readings and bring with you to any provider appointments.  Bring your home blood pressure machine with you to a provider's office for accuracy comparison at least once a year.   Make sure you take your blood pressure medications before you come to any office visit, even if you were asked to fast for labs.   Our goal bad cholesterol, or LDL, is less than 70 . This is why it is important to continue taking your atorvastatin  Feel free to call me with any questions or concerns. I look forward to our next call!   Estelle Grumbles, PharmD, [facility] Clinical Pharmacist [facility] [phone]

## 2023-07-27 ENCOUNTER — Ambulatory Visit: Admitting: Student in an Organized Health Care Education/Training Program

## 2023-07-27 ENCOUNTER — Encounter: Payer: Self-pay | Admitting: Student in an Organized Health Care Education/Training Program

## 2023-07-27 VITALS — BP 112/60 | HR 68 | Temp 98.1°F | Ht 68.0 in | Wt 223.6 lb

## 2023-07-27 DIAGNOSIS — R0602 Shortness of breath: Secondary | ICD-10-CM

## 2023-07-27 DIAGNOSIS — I5020 Unspecified systolic (congestive) heart failure: Secondary | ICD-10-CM | POA: Diagnosis not present

## 2023-07-27 DIAGNOSIS — G4733 Obstructive sleep apnea (adult) (pediatric): Secondary | ICD-10-CM

## 2023-07-27 DIAGNOSIS — I25119 Atherosclerotic heart disease of native coronary artery with unspecified angina pectoris: Secondary | ICD-10-CM

## 2023-07-27 LAB — PULMONARY FUNCTION TEST
DL/VA % pred: 91 %
DL/VA: 3.5 ml/min/mmHg/L
DLCO unc % pred: 66 %
DLCO unc: 14.89 ml/min/mmHg
FEF 25-75 Post: 3.89 L/s
FEF 25-75 Pre: 2.62 L/s
FEF2575-%Change-Post: 48 %
FEF2575-%Pred-Post: 229 %
FEF2575-%Pred-Pre: 155 %
FEV1-%Change-Post: 11 %
FEV1-%Pred-Post: 96 %
FEV1-%Pred-Pre: 86 %
FEV1-Post: 2.44 L
FEV1-Pre: 2.19 L
FEV1FVC-%Change-Post: 3 %
FEV1FVC-%Pred-Pre: 117 %
FEV6-%Change-Post: 7 %
FEV6-%Pred-Post: 84 %
FEV6-%Pred-Pre: 78 %
FEV6-Post: 2.82 L
FEV6-Pre: 2.62 L
FEV6FVC-%Pred-Post: 107 %
FEV6FVC-%Pred-Pre: 107 %
FVC-%Change-Post: 7 %
FVC-%Pred-Post: 78 %
FVC-%Pred-Pre: 72 %
FVC-Post: 2.82 L
FVC-Pre: 2.62 L
Post FEV1/FVC ratio: 87 %
Post FEV6/FVC ratio: 100 %
Pre FEV1/FVC ratio: 84 %
Pre FEV6/FVC Ratio: 100 %
RV % pred: 89 %
RV: 2.31 L
TLC % pred: 74 %
TLC: 4.96 L

## 2023-07-27 MED ORDER — FLUTICASONE FUROATE-VILANTEROL 100-25 MCG/ACT IN AEPB: 1.0000 | INHALATION_SPRAY | Freq: Every day | RESPIRATORY_TRACT | 11 refills | Status: DC

## 2023-07-27 NOTE — Patient Instructions (Signed)
 Full PFT completed today ? ?

## 2023-07-27 NOTE — Progress Notes (Unsigned)
 Synopsis: Referred in *** by Isadora Hose, MD  Assessment & Plan:   1. Shortness of breath (Primary) *** - fluticasone  furoate-vilanterol (BREO ELLIPTA ) 100-25 MCG/ACT AEPB; Inhale 1 puff into the lungs daily.  Dispense: 30 each; Refill: 11 - CT CHEST HIGH RESOLUTION; Future  Shortness of breath improved after cath, but persists. PFT's with some reactivity and some restriction. Rales on bibasilar auscultation  Will attempt ICS/LABA with breo, will also get HRCT.  Return in about 3 months (around 10/27/2023).  I spent *** minutes caring for this patient today, including {EM billing:28027}  Hose Isadora, MD [facility] 07/27/2023 11:52 AM    End of visit medications:  Meds ordered this encounter  Medications   fluticasone  furoate-vilanterol (BREO ELLIPTA ) 100-25 MCG/ACT AEPB    Sig: Inhale 1 puff into the lungs daily.    Dispense:  30 each    Refill:  11     Current Outpatient Medications:    acetaminophen  (TYLENOL ) 650 MG CR tablet, Take 1,300 mg by mouth at bedtime as needed for pain., Disp: , Rfl:    albuterol  (VENTOLIN  HFA) 108 (90 Base) MCG/ACT inhaler, Inhale 1-2 puffs into the lungs every 4 (four) hours as needed for wheezing or shortness of breath (cough)., Disp: 1 each, Rfl: 3   aspirin  EC 81 MG tablet, Take 81 mg by mouth daily., Disp: , Rfl:    atenolol  (TENORMIN ) 25 MG tablet, Take 1 tablet (25 mg total) by mouth daily., Disp: 90 tablet, Rfl: 3   atorvastatin  (LIPITOR ) 80 MG tablet, TAKE 1 TABLET(80 MG) BY MOUTH DAILY, Disp: 90 tablet, Rfl: 1   Calcium  Carb-Cholecalciferol (CALCIUM  600 + D PO), Take 1 tablet by mouth daily., Disp: , Rfl:    Cholecalciferol (VITAMIN D) 50 MCG (2000 UT) tablet, Take 2,000 Units by mouth daily., Disp: , Rfl:    clopidogrel  (PLAVIX ) 75 MG tablet, Take 1 tablet (75 mg total) by mouth daily., Disp: 90 tablet, Rfl: 3   Coenzyme Q10 100 MG TABS, Take 100 mg by mouth daily., Disp: , Rfl:    diclofenac sodium  (VOLTAREN) 1 % GEL, Apply 2 g topically 4 (four) times daily as needed for pain. Foot pain, Disp: , Rfl:    escitalopram  (LEXAPRO ) 10 MG tablet, Take 1 tablet (10 mg total) by mouth daily., Disp: 90 tablet, Rfl: 1   fluticasone  furoate-vilanterol (BREO ELLIPTA ) 100-25 MCG/ACT AEPB, Inhale 1 puff into the lungs daily., Disp: 30 each, Rfl: 11   furosemide  (LASIX ) 80 MG tablet, Take 1 tablet (80 mg total) by mouth daily. May take 2nd dose AS NEEDED for fluid retention., Disp: , Rfl:    ipratropium (ATROVENT ) 0.06 % nasal spray, Place 2 sprays into both nostrils 4 (four) times daily as needed for rhinitis., Disp: 15 mL, Rfl: 2   levothyroxine  (SYNTHROID ) 125 MCG tablet, Take 1 tablet (125 mcg total) by mouth daily before breakfast., Disp: 90 tablet, Rfl: 1   loratadine  (CLARITIN ) 10 MG tablet, Take 10 mg by mouth daily., Disp: , Rfl:    montelukast  (SINGULAIR ) 10 MG tablet, TAKE 1 TABLET(10 MG) BY MOUTH AT BEDTIME, Disp: 90 tablet, Rfl: 0   Multiple Vitamin (MULTIVITAMIN) tablet, Take 1 tablet by mouth daily., Disp: , Rfl:    nitroGLYCERIN  (NITROSTAT ) 0.4 MG SL tablet, Place 1 tablet (0.4 mg total) under the tongue every 5 (five) minutes as needed for chest pain., Disp: 20 tablet, Rfl: 2   PATADAY 0.1 % ophthalmic solution, Place 1 drop into both  eyes daily as needed for allergies., Disp: , Rfl:    sennosides-docusate sodium  (SENOKOT-S) 8.6-50 MG tablet, Take 2 tablets by mouth daily., Disp: , Rfl:    traZODone  (DESYREL ) 50 MG tablet, TAKE 1 TABLET(50 MG) BY MOUTH AT BEDTIME, Disp: 90 tablet, Rfl: 3   clotrimazole -betamethasone  (LOTRISONE ) cream, Apply twice a day for 1-2 weeks, may repeat if need in future (Patient not taking: Reported on 07/27/2023), Disp: 30 g, Rfl: 1   colchicine  0.6 MG tablet, For acute gout flare take 2 pills at once, then take a 3rd pill 2 hours later. Then take 1 pill daily for 7-10 days or until resolved. (Patient not taking: Reported on 07/27/2023), Disp: 30 tablet, Rfl: 2    meclizine  (ANTIVERT ) 12.5 MG tablet, Take 1 tablet (12.5 mg total) by mouth 3 (three) times daily as needed for dizziness. (Patient not taking: Reported on 07/27/2023), Disp: 30 tablet, Rfl: 1   Subjective:   PATIENT ID: Luke Mckee GENDER: male DOB: 04-Aug-1940, MRN: [id number]  Chief Complaint  Patient presents with   Follow-up    SOB, PET: 04/21/23, PFT: 07/27/23      HPI ***  Ancillary information including prior medications, full medical/surgical/family/social histories, and PFTs (when available) are listed below and have been reviewed.   ROS   Objective:   Vitals:   07/27/23 1133  BP: 112/60  Pulse: 68  Temp: 98.1 F (36.7 C)  TempSrc: Oral  SpO2: 93%  Weight: 223 lb 9.6 oz (101.4 kg)  Height: 5' 8 (1.727 m)   93% on *** LPM *** RA BMI Readings from Last 3 Encounters:  07/27/23 34.00 kg/m  07/27/23 34.00 kg/m  07/11/23 34.27 kg/m   Wt Readings from Last 3 Encounters:  07/27/23 223 lb 9.6 oz (101.4 kg)  07/27/23 223 lb 9.6 oz (101.4 kg)  07/11/23 225 lb 6.4 oz (102.2 kg)    Physical Exam    Ancillary Information    Past Medical History:  Diagnosis Date   Anxiety    Arthritis    CAD (coronary artery disease) 2005   s/p CABG, DES   CHF (congestive heart failure) (HCC)    in EPIC care everywhere   Chronic kidney disease    COPD (chronic obstructive pulmonary disease) (HCC)    in EPIC care everywhere   Depression    Heart murmur    History of stroke    Hypothyroidism    OSA (obstructive sleep apnea)    Paroxysmal A-fib (HCC)    in EPIC careeverywhere     Family History  Problem Relation Age of Onset   Anxiety disorder Sister      Past Surgical History:  Procedure Laterality Date   APPENDECTOMY     BUNIONECTOMY     CATARACT EXTRACTION     Right eye   CATARACT EXTRACTION W/PHACO Left 01/19/2021   Procedure: CATARACT EXTRACTION PHACO AND INTRAOCULAR LENS PLACEMENT (IOC) LEFT 3.09 00:28.3;  Surgeon: Myrna Adine Anes, MD;  Location:  [facility];  Service: Ophthalmology;  Laterality: Left;   COLONOSCOPY  07/06/2004   CORONARY ARTERY BYPASS GRAFT  2005   CORONARY BALLOON ANGIOPLASTY N/A 05/19/2023   Procedure: CORONARY BALLOON ANGIOPLASTY;  Surgeon: Anner Alm ORN, MD;  Location: [facility] INVASIVE CV LAB;  Service: Cardiovascular;  Laterality: N/A;   LOOP RECORDER INSERTION N/A 07/25/2018   Procedure: LOOP RECORDER INSERTION;  Surgeon: Inocencio Soyla Lunger, MD;  Location: [facility] INVASIVE CV LAB;  Service: Cardiovascular;  Laterality: N/A;   RIGHT/LEFT HEART CATH AND  CORONARY ANGIOGRAPHY Bilateral 05/19/2023   Procedure: RIGHT/LEFT HEART CATH AND CORONARY ANGIOGRAPHY;  Surgeon: Anner Alm ORN, MD;  Location: [facility] INVAS[facility] CV LAB;  Service: Cardiovascular;  Laterality: Bilateral;   TOTAL HIP ARTHROPLASTY Right 11/23/2011   TOTAL HIP ARTHROPLASTY Left 09/07/2011   TOTAL KNEE ARTHROPLASTY Right 02/15/2018   VASECTOMY  1978    Social History   Socioeconomic History   Marital status: Married    Spouse name: Not on file   Number of children: Not on file   Years of education: Not on file   Highest education level: Not on file  Occupational History   Occupation: retired  Tobacco Use   Smoking status: Never   Smokeless tobacco: Never  Vaping Use   Vaping status: Never Used  Substance and Sexual Activity   Alcohol use: Not Currently    Comment: past   Drug use: Never   Sexual activity: Not on file  Other Topics Concern   Not on file  Social History Narrative   Not on file   Social Drivers of Health   Financial Resource Strain: Low Risk  (01/07/2023)   [facility] from [facility]   Overall Financial Resource Strain (CARDIA)    Difficulty of Paying Living Expenses: Not very hard  Food Insecurity: No Food Insecurity (05/19/2023)   Hunger Vital Sign    Worried About Running Out of Food in the Last Year: Never true    Ran Out of Food in the Last Year: Never true  Transportation Needs: No  Transportation Needs (05/19/2023)   PRAPARE - Administrator, Civil Service (Medical): No    Lack of Transportation (Non-Medical): No  Physical Activity: Sufficiently Active (10/22/2022)   Exercise Vital Sign    Days of Exercise per Week: 6 days    Minutes of Exercise per Session: 30 min  Stress: No Stress Concern Present (10/22/2022)   [employer] of Occupational Health - Occupational Stress Questionnaire    Feeling of Stress : Not at all  Social Connections: Socially Integrated (05/19/2023)   Social Connection and Isolation Panel    Frequency of Communication with Friends and Family: More than three times a week    Frequency of Social Gatherings with Friends and Family: Three times a week    Attends Religious Services: More than 4 times per year    Active Member of Clubs or Organizations: Patient declined    Attends Banker Meetings: 1 to 4 times per year    Marital Status: Married  Catering manager Violence: Not At Risk (05/19/2023)   Humiliation, Afraid, Rape, and Kick questionnaire    Fear of Current or Ex-Partner: No    Emotionally Abused: No    Physically Abused: No    Sexually Abused: No     Allergies  Allergen Reactions   Pollen Extract Cough   Ace Inhibitors    Cephalexin     Vomiting and diarrhea   Crestor [Rosuvastatin Calcium ] Other (See Comments)    myalgia   Tape Rash    blistering     CBC    Component Value Date/Time   WBC 5.8 06/28/2023 1027   RBC 3.89 (L) 06/28/2023 1027   HGB 11.9 (L) 06/28/2023 1027   HGB 13.5 05/17/2023 1203   HCT 38.4 (L) 06/28/2023 1027   HCT 41.6 05/17/2023 1203   PLT 228 06/28/2023 1027   PLT 287 05/17/2023 1203   MCV 98.7 06/28/2023 1027   MCV 97 05/17/2023 1203  MCH 30.6 06/28/2023 1027   MCHC 31.0 (L) 06/28/2023 1027   RDW 13.1 06/28/2023 1027   RDW 13.1 05/17/2023 1203   LYMPHSABS 1.7 07/29/2022 1408   MONOABS 0.8 07/29/2022 1408   EOSABS 319 06/28/2023 1027   BASOSABS 81 06/28/2023 1027     Pulmonary Functions Testing Results:    Latest Ref Rng & Units 07/27/2023   10:51 AM  PFT Results  FVC-Pre L 2.62   FVC-Predicted Pre % 72   FVC-Post L 2.82   FVC-Predicted Post % 78   Pre FEV1/FVC % % 84   Post FEV1/FCV % % 87   FEV1-Pre L 2.19   FEV1-Predicted Pre % 86   FEV1-Post L 2.44   DLCO uncorrected ml/min/mmHg 14.89   DLCO UNC% % 66   DLVA Predicted % 91   TLC L 4.96   TLC % Predicted % 74   RV % Predicted % 89     Outpatient Medications Prior to Visit  Medication Sig Dispense Refill   acetaminophen  (TYLENOL ) 650 MG CR tablet Take 1,300 mg by mouth at bedtime as needed for pain.     albuterol  (VENTOLIN  HFA) 108 (90 Base) MCG/ACT inhaler Inhale 1-2 puffs into the lungs every 4 (four) hours as needed for wheezing or shortness of breath (cough). 1 each 3   aspirin  EC 81 MG tablet Take 81 mg by mouth daily.     atenolol  (TENORMIN ) 25 MG tablet Take 1 tablet (25 mg total) by mouth daily. 90 tablet 3   atorvastatin  (LIPITOR ) 80 MG tablet TAKE 1 TABLET(80 MG) BY MOUTH DAILY 90 tablet 1   Calcium  Carb-Cholecalciferol (CALCIUM  600 + D PO) Take 1 tablet by mouth daily.     Cholecalciferol (VITAMIN D) 50 MCG (2000 UT) tablet Take 2,000 Units by mouth daily.     clopidogrel  (PLAVIX ) 75 MG tablet Take 1 tablet (75 mg total) by mouth daily. 90 tablet 3   Coenzyme Q10 100 MG TABS Take 100 mg by mouth daily.     diclofenac sodium (VOLTAREN) 1 % GEL Apply 2 g topically 4 (four) times daily as needed for pain. Foot pain     escitalopram  (LEXAPRO ) 10 MG tablet Take 1 tablet (10 mg total) by mouth daily. 90 tablet 1   furosemide  (LASIX ) 80 MG tablet Take 1 tablet (80 mg total) by mouth daily. May take 2nd dose AS NEEDED for fluid retention.     ipratropium (ATROVENT ) 0.06 % nasal spray Place 2 sprays into both nostrils 4 (four) times daily as needed for rhinitis. 15 mL 2   levothyroxine  (SYNTHROID ) 125 MCG tablet Take 1 tablet (125 mcg total) by mouth daily before breakfast. 90 tablet  1   loratadine  (CLARITIN ) 10 MG tablet Take 10 mg by mouth daily.     montelukast  (SINGULAIR ) 10 MG tablet TAKE 1 TABLET(10 MG) BY MOUTH AT BEDTIME 90 tablet 0   Multiple Vitamin (MULTIVITAMIN) tablet Take 1 tablet by mouth daily.     nitroGLYCERIN  (NITROSTAT ) 0.4 MG SL tablet Place 1 tablet (0.4 mg total) under the tongue every 5 (five) minutes as needed for chest pain. 20 tablet 2   PATADAY 0.1 % ophthalmic solution Place 1 drop into both eyes daily as needed for allergies.     sennosides-docusate sodium  (SENOKOT-S) 8.6-50 MG tablet Take 2 tablets by mouth daily.     traZODone  (DESYREL ) 50 MG tablet TAKE 1 TABLET(50 MG) BY MOUTH AT BEDTIME 90 tablet 3   clotrimazole -betamethasone  (LOTRISONE ) cream Apply twice  a day for 1-2 weeks, may repeat if need in future (Patient not taking: Reported on 07/27/2023) 30 g 1   colchicine  0.6 MG tablet For acute gout flare take 2 pills at once, then take a 3rd pill 2 hours later. Then take 1 pill daily for 7-10 days or until resolved. (Patient not taking: Reported on 07/27/2023) 30 tablet 2   meclizine  (ANTIVERT ) 12.5 MG tablet Take 1 tablet (12.5 mg total) by mouth 3 (three) times daily as needed for dizziness. (Patient not taking: Reported on 07/27/2023) 30 tablet 1   No facility-administered medications prior to visit.

## 2023-07-27 NOTE — Progress Notes (Signed)
 Full PFT completed today ? ?

## 2023-07-28 ENCOUNTER — Other Ambulatory Visit (HOSPITAL_COMMUNITY): Payer: Self-pay

## 2023-07-28 ENCOUNTER — Telehealth: Payer: Self-pay

## 2023-07-28 ENCOUNTER — Ambulatory Visit: Payer: Medicare PPO | Admitting: Physical Therapy

## 2023-07-28 DIAGNOSIS — R0602 Shortness of breath: Secondary | ICD-10-CM

## 2023-07-28 NOTE — Telephone Encounter (Signed)
*  Pulm  Pharmacy Patient Advocate Encounter   Received notification from Fax that prior authorization for Breo Ellipta  is required/requested.   Insurance verification completed.   The patient is insured through [employer] .   Per test claim:  Generic Advair Diskus/Wixela is preferred by the insurance.  If suggested medication is appropriate, Please send in a new RX and discontinue this one. If not, please advise as to why it's not appropriate so that we may request a Prior Authorization. Please note, some preferred medications may still require a PA.  If the suggested medications have not been trialed and there are no contraindications to their use, the PA will not be submitted, as it will not be approved.

## 2023-08-03 NOTE — Telephone Encounter (Signed)
Closing due to no response

## 2023-08-04 ENCOUNTER — Ambulatory Visit: Payer: Medicare PPO | Admitting: Physical Therapy

## 2023-08-06 MED ORDER — FLUTICASONE-SALMETEROL 100-50 MCG/ACT IN AEPB: 1.0000 | INHALATION_SPRAY | Freq: Two times a day (BID) | RESPIRATORY_TRACT | 12 refills | Status: DC

## 2023-08-06 NOTE — Addendum Note (Signed)
 Addended by: Deaysia Grigoryan on: 08/06/2023 06:15 PM   Modules accepted: Orders

## 2023-08-17 ENCOUNTER — Ambulatory Visit (INDEPENDENT_AMBULATORY_CARE_PROVIDER_SITE_OTHER): Admitting: Urology

## 2023-08-17 ENCOUNTER — Encounter: Payer: Self-pay | Admitting: Urology

## 2023-08-17 VITALS — BP 128/73 | HR 87 | Ht 68.0 in | Wt 215.0 lb

## 2023-08-17 DIAGNOSIS — N5089 Other specified disorders of the male genital organs: Secondary | ICD-10-CM

## 2023-08-17 DIAGNOSIS — N5201 Erectile dysfunction due to arterial insufficiency: Secondary | ICD-10-CM

## 2023-08-17 MED ORDER — SILDENAFIL CITRATE 100 MG PO TABS: ORAL_TABLET | ORAL | 0 refills | Status: DC

## 2023-08-18 ENCOUNTER — Encounter: Payer: Self-pay | Admitting: Urology

## 2023-08-18 NOTE — Progress Notes (Signed)
 08/17/2023 7:19 AM   Luke Mckee Sep 27, 1940 [id number]  Referring provider: Edman Marsa PARAS, DO [address] [place],  [place] [address]  Chief Complaint  Patient presents with   Erectile Dysfunction    HPI: Luke Mckee is a 83 y.o. male referred for evaluation of a scrotal mass and erectile dysfunction.  He noted a small scrotal mass several months ago which was asymptomatic however he states it is no longer present. Has urinary frequency and urgency which he states is not bothersome. Primary complaint today is erectile dysfunction.  Complains of partial erections which are not firm enough for penetration and difficulty maintaining an erection. Organic risk factors: Coronary artery disease, history of CVA, hypertension, SSRI medication and beta-blocker.  No prior tobacco history No prior treatments for his ED   PMH: Past Medical History:  Diagnosis Date   Anxiety    Arthritis    CAD (coronary artery disease) 2005   s/p CABG, DES   CHF (congestive heart failure) (HCC)    in EPIC care everywhere   Chronic kidney disease    COPD (chronic obstructive pulmonary disease) (HCC)    in EPIC care everywhere   Depression    Heart murmur    History of stroke    Hypothyroidism    OSA (obstructive sleep apnea)    Paroxysmal A-fib (HCC)    in EPIC careeverywhere    Surgical History: Past Surgical History:  Procedure Laterality Date   APPENDECTOMY     BUNIONECTOMY     CATARACT EXTRACTION     Right eye   CATARACT EXTRACTION W/PHACO Left 01/19/2021   Procedure: CATARACT EXTRACTION PHACO AND INTRAOCULAR LENS PLACEMENT (IOC) LEFT 3.09 00:28.3;  Surgeon: Myrna Adine Anes, MD;  Location: [facility];  Service: Ophthalmology;  Laterality: Left;   COLONOSCOPY  07/06/2004   CORONARY ARTERY BYPASS GRAFT  2005   CORONARY BALLOON ANGIOPLASTY N/A 05/19/2023   Procedure: CORONARY BALLOON ANGIOPLASTY;  Surgeon: Anner Alm ORN, MD;  Location: [facility];  Service:  Cardiovascular;  Laterality: N/A;   LOOP RECORDER INSERTION N/A 07/25/2018   Procedure: LOOP RECORDER INSERTION;  Surgeon: Inocencio Soyla Lunger, MD;  Location: [facility];  Service: Cardiovascular;  Laterality: N/A;   RIGHT/LEFT HEART CATH AND CORONARY ANGIOGRAPHY Bilateral 05/19/2023   Procedure: RIGHT/LEFT HEART CATH AND CORONARY ANGIOGRAPHY;  Surgeon: Anner Alm ORN, MD;  Location: [facility];  Service: Cardiovascular;  Laterality: Bilateral;   TOTAL HIP ARTHROPLASTY Right 11/23/2011   TOTAL HIP ARTHROPLASTY Left 09/07/2011   TOTAL KNEE ARTHROPLASTY Right 02/15/2018   VASECTOMY  1978    Home Medications:  Allergies as of 08/17/2023       Reactions   Pollen Extract Cough   Ace Inhibitors    Cephalexin    Vomiting and diarrhea   Crestor [rosuvastatin Calcium ] Other (See Comments)   myalgia   Tape Rash   blistering        Medication List        Accurate as of August 17, 2023 11:59 PM. If you have any questions, ask your nurse or doctor.          acetaminophen  650 MG CR tablet Commonly known as: TYLENOL  Take 1,300 mg by mouth at bedtime as needed for pain.   albuterol  108 (90 Base) MCG/ACT inhaler Commonly known as: VENTOLIN  HFA Inhale 1-2 puffs into the lungs every 4 (four) hours as needed for wheezing or shortness of breath (cough).   aspirin  EC  81 MG tablet Take 81 mg by mouth daily.   atenolol  25 MG tablet Commonly known as: TENORMIN  Take 1 tablet (25 mg total) by mouth daily.   atorvastatin  80 MG tablet Commonly known as: LIPITOR  TAKE 1 TABLET(80 MG) BY MOUTH DAILY   CALCIUM  600 + D PO Take 1 tablet by mouth daily.   clopidogrel  75 MG tablet Commonly known as: PLAVIX  Take 1 tablet (75 mg total) by mouth daily.   clotrimazole -betamethasone  cream Commonly known as: LOTRISONE  Apply twice a day for 1-2 weeks, may repeat if need in future   Coenzyme Q10 100 MG Tabs Take 100 mg by mouth daily.   colchicine  0.6 MG tablet For acute gout  flare take 2 pills at once, then take a 3rd pill 2 hours later. Then take 1 pill daily for 7-10 days or until resolved.   diclofenac sodium 1 % Gel Commonly known as: VOLTAREN Apply 2 g topically 4 (four) times daily as needed for pain. Foot pain   escitalopram  10 MG tablet Commonly known as: LEXAPRO  Take 1 tablet (10 mg total) by mouth daily.   fluticasone -salmeterol 100-50 MCG/ACT Aepb Commonly known as: Wixela Inhub Inhale 1 puff into the lungs 2 (two) times daily.   furosemide  80 MG tablet Commonly known as: LASIX  Take 1 tablet (80 mg total) by mouth daily. May take 2nd dose AS NEEDED for fluid retention.   ipratropium 0.06 % nasal spray Commonly known as: ATROVENT  Place 2 sprays into both nostrils 4 (four) times daily as needed for rhinitis.   levothyroxine  125 MCG tablet Commonly known as: SYNTHROID  Take 1 tablet (125 mcg total) by mouth daily before breakfast.   loratadine  10 MG tablet Commonly known as: CLARITIN  Take 10 mg by mouth daily.   meclizine  12.5 MG tablet Commonly known as: ANTIVERT  Take 1 tablet (12.5 mg total) by mouth 3 (three) times daily as needed for dizziness.   montelukast  10 MG tablet Commonly known as: SINGULAIR  TAKE 1 TABLET(10 MG) BY MOUTH AT BEDTIME   multivitamin tablet Take 1 tablet by mouth daily.   nitroGLYCERIN  0.4 MG SL tablet Commonly known as: NITROSTAT  Place 1 tablet (0.4 mg total) under the tongue every 5 (five) minutes as needed for chest pain.   Pataday 0.1 % ophthalmic solution Generic drug: olopatadine Place 1 drop into both eyes daily as needed for allergies.   sennosides-docusate sodium  8.6-50 MG tablet Commonly known as: SENOKOT-S Take 2 tablets by mouth daily.   sildenafil  100 MG tablet Commonly known as: VIAGRA  1/2-1 tab po 1 hour prior to intercourse Started by: Glendia JAYSON Barba   traZODone  50 MG tablet Commonly known as: DESYREL  TAKE 1 TABLET(50 MG) BY MOUTH AT BEDTIME   Vitamin D 50 MCG (2000 UT)  tablet Take 2,000 Units by mouth daily.        Allergies:  Allergies  Allergen Reactions   Pollen Extract Cough   Ace Inhibitors    Cephalexin     Vomiting and diarrhea   Crestor [Rosuvastatin Calcium ] Other (See Comments)    myalgia   Tape Rash    blistering    Family History: Family History  Problem Relation Age of Onset   Anxiety disorder Sister     Social History:  reports that he has never smoked. He has never used smokeless tobacco. He reports that he does not currently use alcohol. He reports that he does not use drugs.   Physical Exam: BP 128/73   Pulse 87   Ht 5'  8 (1.727 m)   Wt 215 lb (97.5 kg)   BMI 32.69 kg/m   Constitutional:  Alert and oriented, No acute distress. HEENT: [place] AT Respiratory: Normal respiratory effort, no increased work of breathing. Psychiatric: Normal mood and affect.   Assessment & Plan:    1.  Erectile dysfunction PDE 5 inhibitor nave SL NTG on med list however he has not taken 2 years Was interested in a PDE 5 inhibitor trial and Rx sildenafil  100 mg 0.5-1 tab 30-60 minutes prior to intercourse We discussed most common side effects including headache and flushing Instructed not to take NTG within 24 hours of sildenafil  dose   Glendia JAYSON Barba, MD  [facility] [address] [place], [place] [address] [phone]

## 2023-08-23 ENCOUNTER — Other Ambulatory Visit: Payer: Self-pay

## 2023-08-23 ENCOUNTER — Other Ambulatory Visit: Payer: Self-pay | Admitting: *Deleted

## 2023-08-23 DIAGNOSIS — N5201 Erectile dysfunction due to arterial insufficiency: Secondary | ICD-10-CM

## 2023-08-23 MED ORDER — SILDENAFIL CITRATE 100 MG PO TABS: ORAL_TABLET | ORAL | 0 refills | Status: DC

## 2023-08-24 ENCOUNTER — Telehealth: Payer: Self-pay

## 2023-08-24 NOTE — Telephone Encounter (Signed)
 I gave patient a call and talk with his wife. I let them know that the pharmacy was denying feeling the Sildenafil  because of patient being on nitroglycerin . Patients wife was hesitant about it. She stated they will talk about it and they will go back to the drawing board for now and wait and see.

## 2023-08-26 NOTE — Patient Outreach (Signed)
 Complex Care Management   Visit Note   Name:  Luke Mckee  MRN: [id number] DOB: 1940-02-24  Situation: Referral received for Complex Care Management related to Heart Failure and Hypertension I obtained verbal consent from Patient.  Visit completed with [name] via telephone.  Background:   Past Medical History:  Diagnosis Date   Anxiety    Arthritis    CAD (coronary artery disease) 2005   s/p CABG, DES   CHF (congestive heart failure) (HCC)    in EPIC care everywhere   Chronic kidney disease    COPD (chronic obstructive pulmonary disease) (HCC)    in EPIC care everywhere   Depression    Heart murmur    History of stroke    Hypothyroidism    OSA (obstructive sleep apnea)    Paroxysmal A-fib (HCC)    in EPIC careeverywhere    Assessment: Patient Reported Symptoms: Cognitive Cognitive Status: Alert and oriented to person, place, and time, Normal speech and language skills Cognitive/Intellectual Conditions Management [RPT]: None reported or documented in medical history or problem list Health Maintenance Behaviors: Sleep adequate Healing Pattern: Unsure Health Facilitated by: Rest  Neurological Neurological Review of Symptoms: No symptoms reported Neurological Management Strategies: Coping strategies, Medication therapy, Routine screening Neurological Self-Management Outcome: 4 (good)  HEENT HEENT Symptoms Reported: No symptoms reported HEENT Management Strategies: Medical device, Coping strategies, Routine screening HEENT Self-Management Outcome: 4 (good)  Cardiovascular Cardiovascular Symptoms Reported: Swelling in legs or feet (Reports miminal swelling in lower extremities. Reports keeping legs elevated as advised.) Does patient have uncontrolled Hypertension?: No Cardiovascular Management Strategies: Coping strategies, Adequate rest, Medication therapy, Medical device, Routine screening Do You Have a Working Readable Scale?: Yes Weight: 224 lb  (101.6 kg) Cardiovascular Self-Management Outcome: 4 (good)  Respiratory Respiratory Symptoms Reported: No symptoms reported  Endocrine Endocrine Symptoms Reported: No symptoms reported Is patient diabetic?: No Endocrine Self-Management Outcome: 4 (good)  Gastrointestinal Gastrointestinal Symptoms Reported: No symptoms reported  Genitourinary Genitourinary Symptoms Reported: Other Other Genitourinary Symptoms: Reports discussing use of viagra  with his Urologist.  Integumentary Integumentary Symptoms Reported: No symptoms reported  Musculoskeletal Musculoskelatal Symptoms Reviewed: Other Other Musculoskeletal Symptoms: Reports using cane with ambulation as needed  Psychosocial Psychosocial Symptoms Reported: No symptoms reported Quality of Family Relationships: supportive Do you feel physically threatened by others?: No      08/23/2023    3:44 PM  Depression screen PHQ 2/9  Decreased Interest 0  Down, Depressed, Hopeless 0  PHQ - 2 Score 0    Vitals:   08/23/23 1539  BP: (!) 128/91    Medications Reviewed Today     Reviewed by Karoline Lima, RN (Registered Nurse) on 08/26/23 at [id number]  Med List Status: <None>   Medication Order Taking? Sig Documenting Provider Last Dose Status Informant  acetaminophen  (TYLENOL ) 650 MG CR tablet [id number]  Take 1,300 mg by mouth at bedtime as needed for pain. [provider]  Active Spouse/Significant Other  albuterol  (VENTOLIN  HFA) 108 (90 Base) MCG/ACT inhaler [id number]  Inhale 1-2 puffs into the lungs every 4 (four) hours as needed for wheezing or shortness of breath (cough). Edman Marsa PARAS, DO  Active Spouse/Significant Other           Med Note MALKA LYNWOOD CHRISTELLA [name] 19, 2023 12:53 PM) > 1 month since last used  aspirin  EC 81 MG tablet [id number]  Take 81 mg by mouth daily. [provider]  Active Spouse/Significant Other  atenolol  (TENORMIN )  25 MG tablet [id number]  Take 1 tablet (25 mg total) by mouth daily.  Edman Marsa PARAS, DO  Active Spouse/Significant Other           Med Note HALLIE, Laporcha Marchesi N   Wed Feb 16, 2023 11:27 AM) Reports taking in the morning as advised by Cardiology team  atorvastatin  (LIPITOR ) 80 MG tablet [id number]  TAKE 1 TABLET(80 MG) BY MOUTH DAILY Edman, Marsa PARAS, DO  Active   Calcium  Carb-Cholecalciferol (CALCIUM  600 + D PO) [id number]  Take 1 tablet by mouth daily. [provider]  Active Spouse/Significant Other  Cholecalciferol (VITAMIN D) 50 MCG (2000 UT) tablet [id number]  Take 2,000 Units by mouth daily. [provider]  Active Spouse/Significant Other  clopidogrel  (PLAVIX ) 75 MG tablet [id number]  Take 1 tablet (75 mg total) by mouth daily. Vivienne Lonni Ingle, NP  Active   clotrimazole -betamethasone  (LOTRISONE ) cream [id number]  Apply twice a day for 1-2 weeks, may repeat if need in future  Patient not taking: Reported on 08/17/2023   Edman Marsa PARAS, DO  Active Spouse/Significant Other           Med Note MALKA LYNWOOD HERO   Thu May 19, 2023  1:17 PM) > 1 month since last used    Coenzyme Q10 100 MG TABS [id number]  Take 100 mg by mouth daily. [provider]  Active Spouse/Significant Other  colchicine  0.6 MG tablet [id number]  For acute gout flare take 2 pills at once, then take a 3rd pill 2 hours later. Then take 1 pill daily for 7-10 days or until resolved.  Patient not taking: Reported on 08/17/2023   Edman Marsa PARAS, DO  Active Spouse/Significant Other           Med Note MALKA LYNWOOD HERO   Thu May 19, 2023  1:18 PM) > 1 month since last used  diclofenac sodium (VOLTAREN) 1 % GEL [id number]  Apply 2 g topically 4 (four) times daily as needed for pain. Foot pain [provider]  Active Spouse/Significant Other           Med Note MALKA LYNWOOD HERO   Thu May 19, 2023  1:18 PM) > 1 month since last used  escitalopram  (LEXAPRO ) 10 MG tablet [id number]  Take 1 tablet (10 mg total) by mouth daily.  Karamalegos, Marsa PARAS, DO  Active   fluticasone -salmeterol (WIXELA INHUB) 100-50 MCG/ACT AEPB [id number]  Inhale 1 puff into the lungs 2 (two) times daily. Isadora Hose, MD  Active   furosemide  (LASIX ) 80 MG tablet [id number]  Take 1 tablet (80 mg total) by mouth daily. May take 2nd dose AS NEEDED for fluid retention. Karamalegos, Marsa PARAS, DO  Active   ipratropium (ATROVENT ) 0.06 % nasal spray [id number]  Place 2 sprays into both nostrils 4 (four) times daily as needed for rhinitis. Edman Marsa PARAS, DO  Active Spouse/Significant Other           Med Note MALKA LYNWOOD HERO [name] 19, 2023  1:19 PM) > 1 month since last used  levothyroxine  (SYNTHROID ) 125 MCG tablet [id number]  Take 1 tablet (125 mcg total) by mouth daily before breakfast. Edman Marsa PARAS, DO  Active   loratadine  (CLARITIN ) 10 MG tablet [id number]  Take 10 mg by mouth daily. [provider]  Active Spouse/Significant Other  meclizine  (ANTIVERT ) 12.5 MG tablet [id number]  Take 1 tablet (12.5 mg total) by mouth 3 (three) times daily as needed for dizziness.  Patient not taking: Reported  on 08/17/2023   Edman Marsa PARAS, DO  Active Spouse/Significant Other           Med Note MALKA LYNWOOD CHRISTELLA [name] 19, 2023  1:21 PM) > 1 month since last used  montelukast  (SINGULAIR ) 10 MG tablet [id number]  TAKE 1 TABLET(10 MG) BY MOUTH AT BEDTIME Edman Marsa PARAS, DO  Active   Multiple Vitamin (MULTIVITAMIN) tablet [id number]  Take 1 tablet by mouth daily. [provider]  Active Spouse/Significant Other  nitroGLYCERIN  (NITROSTAT ) 0.4 MG SL tablet [id number]  Place 1 tablet (0.4 mg total) under the tongue every 5 (five) minutes as needed for chest pain. Edman Marsa PARAS, DO  Active Spouse/Significant Other           Med Note JACKOLYN, ZEA J   Mon Jun 20, 2023  2:22 PM) Prn  PATADAY 0.1 % ophthalmic solution [id number]  Place 1 drop into both eyes daily as needed for allergies. [provider]  Active Spouse/Significant Other  sennosides-docusate sodium  (SENOKOT-S) 8.6-50 MG tablet [id number]  Take 2 tablets by mouth daily. [provider]  Active Spouse/Significant Other  sildenafil  (VIAGRA ) 100 MG tablet [id number]  1/2-1 tab po 1 hour prior to intercourse Twylla Glendia BROCKS, MD  Active   traZODone  (DESYREL ) 50 MG tablet [id number]  TAKE 1 TABLET(50 MG) BY MOUTH AT BEDTIME Edman Marsa PARAS, DO  Active             Recommendation:   Continue Current Plan of Care Complete outreach with Cardiology as scheduled on August 29, 2023.  Follow Up Plan:   Telephone follow up appointment with Nurse Case Manager on November 01, 2023   Jackson Acron [facility] RN Care Manager Direct Dial: [phone]  Fax: [phone] Website: [email or web address]

## 2023-08-26 NOTE — Patient Instructions (Addendum)
 Thank you for allowing the Complex Care Management team to participate in your care. It was great speaking with you.  We will follow up on November 01, 2023 at 3 pm. Please do not hesitate to contact me if you require assistance prior to our next outreach.    Jackson Acron [facility] Population [facility] RN Care Manager Direct Dial: [phone]  Fax: [phone] Website: [email or web address]

## 2023-08-27 NOTE — Progress Notes (Unsigned)
 Date:  08/29/2023   ID:  Luke Mckee, DOB 11/12/1940, MRN [id number]  Patient Location:  [address] [place] [address]   Provider location:   [facility], [place] office  PCP:  Luke Marsa PARAS, DO  Cardiologist:  Luke Mckee  Chief Complaint  Patient presents with   3 month follow up     History of Present Illness:    Luke Mckee is a 83 y.o. male past medical history of CAD s/p CABG x 4 in [place]  2005,  stent placement x 2 in past 2-3 years, Anxiety HTN prior CVA identified on outside imaging in [place]  04/2018,  Hx of lacunar stroke , Second CVA 2021 Hospital 5 days, rehab 6 days Right side affected,  Loop monitor, no atrial fibrillation Ejection fraction 50% Who presents for coronary artery disease, chronic dizziness  Last seen by myself in clinic 10/24 Last seen in clinic by one of our providers May 27, 2023  Nuclear PET study April 2025, results reviewed small reversible defect noted, lung fibrosis noted  Went for cardiac catheterization 05/19/23 Results reviewed PTCA of the ostial and proximal vein graft to OM 1 RCA 100% occluded, vein graft to the distal OM occluded  In follow-up today denies significant chest pain Stays inside on hot days Does mowing for activity around his property  Denies significant lower extremity edema  Followed by pulmonary, high-resolution CT chest ordered for fibrosis,  Test has not been scheduled  Has monthly loop recorder downloads,  no significant arrhythmia  Lab work reviewed A1C 6.1 Total cholesterol 115 LDL 59  Other past medical history reviewed emergency room on July 29, 2018 for near syncope Concern for orthostasis on that visit In the ER, atenolol  was cut in half down to 25 daily Also given meclizine , refer to ENT  Seen by vestibular PT  Other past medical history reviewed Cath possibly >2 years ago, stent x 1 Prior cath >3 years , stent x 1  Echocardiogram July  2020, EF 50%, mildly decreased RV function   Past Medical History:  Diagnosis Date   Anxiety    Arthritis    CAD (coronary artery disease) 2005   s/p CABG, DES   CHF (congestive heart failure) (HCC)    in EPIC care everywhere   Chronic kidney disease    COPD (chronic obstructive pulmonary disease) (HCC)    in EPIC care everywhere   Depression    Heart murmur    History of stroke    Hypothyroidism    OSA (obstructive sleep apnea)    Paroxysmal A-fib (HCC)    in EPIC careeverywhere   Past Surgical History:  Procedure Laterality Date   APPENDECTOMY     BUNIONECTOMY     CATARACT EXTRACTION     Right eye   CATARACT EXTRACTION W/PHACO Left 01/19/2021   Procedure: CATARACT EXTRACTION PHACO AND INTRAOCULAR LENS PLACEMENT (IOC) LEFT 3.09 00:28.3;  Surgeon: Luke Adine Anes, MD;  Location: [facility];  Service: Ophthalmology;  Laterality: Left;   COLONOSCOPY  07/06/2004   CORONARY ARTERY BYPASS GRAFT  2005   CORONARY BALLOON ANGIOPLASTY N/A 05/19/2023   Procedure: CORONARY BALLOON ANGIOPLASTY;  Surgeon: Luke Alm ORN, MD;  Location: [facility] INVASIVE CV LAB;  Service: Cardiovascular;  Laterality: N/A;   LOOP RECORDER INSERTION N/A 07/25/2018   Procedure: LOOP RECORDER INSERTION;  Surgeon: Luke Soyla Lunger, MD;  Location: [facility];  Service: Cardiovascular;  Laterality: N/A;  RIGHT/LEFT HEART CATH AND CORONARY ANGIOGRAPHY Bilateral 05/19/2023   Procedure: RIGHT/LEFT HEART CATH AND CORONARY ANGIOGRAPHY;  Surgeon: Luke Alm ORN, MD;  Location: [facility] INVASIVE CV LAB;  Service: Cardiovascular;  Laterality: Bilateral;   TOTAL HIP ARTHROPLASTY Right 11/23/2011   TOTAL HIP ARTHROPLASTY Left 09/07/2011   TOTAL KNEE ARTHROPLASTY Right 02/15/2018   VASECTOMY  1978     Current Meds  Medication Sig   acetaminophen  (TYLENOL ) 650 MG CR tablet Take 1,300 mg by mouth at bedtime as needed for pain.   albuterol  (VENTOLIN  HFA) 108 (90 Base) MCG/ACT inhaler Inhale 1-2 puffs into  the lungs every 4 (four) hours as needed for wheezing or shortness of breath (cough).   aspirin  EC 81 MG tablet Take 81 mg by mouth daily.   atenolol  (TENORMIN ) 25 MG tablet Take 1 tablet (25 mg total) by mouth daily.   atorvastatin  (LIPITOR ) 80 MG tablet TAKE 1 TABLET(80 MG) BY MOUTH DAILY   Calcium  Carb-Cholecalciferol (CALCIUM  600 + D PO) Take 1 tablet by mouth daily.   Cholecalciferol (VITAMIN D) 50 MCG (2000 UT) tablet Take 2,000 Units by mouth daily.   clopidogrel  (PLAVIX ) 75 MG tablet Take 1 tablet (75 mg total) by mouth daily.   clotrimazole -betamethasone  (LOTRISONE ) cream Apply twice a day for 1-2 weeks, may repeat if need in future   Coenzyme Q10 100 MG TABS Take 100 mg by mouth daily.   colchicine  0.6 MG tablet For acute gout flare take 2 pills at once, then take a 3rd pill 2 hours later. Then take 1 pill daily for 7-10 days or until resolved.   diclofenac sodium (VOLTAREN) 1 % GEL Apply 2 g topically 4 (four) times daily as needed for pain. Foot pain   escitalopram  (LEXAPRO ) 10 MG tablet Take 1 tablet (10 mg total) by mouth daily.   fluticasone -salmeterol (WIXELA INHUB) 100-50 MCG/ACT AEPB Inhale 1 puff into the lungs 2 (two) times daily.   furosemide  (LASIX ) 80 MG tablet Take 1 tablet (80 mg total) by mouth daily. May take 2nd dose AS NEEDED for fluid retention.   ipratropium (ATROVENT ) 0.06 % nasal spray Place 2 sprays into both nostrils 4 (four) times daily as needed for rhinitis.   levothyroxine  (SYNTHROID ) 125 MCG tablet Take 1 tablet (125 mcg total) by mouth daily before breakfast.   loratadine  (CLARITIN ) 10 MG tablet Take 10 mg by mouth daily.   meclizine  (ANTIVERT ) 12.5 MG tablet Take 1 tablet (12.5 mg total) by mouth 3 (three) times daily as needed for dizziness.   montelukast  (SINGULAIR ) 10 MG tablet TAKE 1 TABLET(10 MG) BY MOUTH AT BEDTIME   Multiple Vitamin (MULTIVITAMIN) tablet Take 1 tablet by mouth daily.   nitroGLYCERIN  (NITROSTAT ) 0.4 MG SL tablet Place 1 tablet (0.4  mg total) under the tongue every 5 (five) minutes as needed for chest pain.   PATADAY 0.1 % ophthalmic solution Place 1 drop into both eyes daily as needed for allergies.   sennosides-docusate sodium  (SENOKOT-S) 8.6-50 MG tablet Take 2 tablets by mouth daily.   traZODone  (DESYREL ) 50 MG tablet TAKE 1 TABLET(50 MG) BY MOUTH AT BEDTIME     Allergies:   Pollen extract, Ace inhibitors, Cephalexin, Crestor [rosuvastatin calcium ], and Tape   Social History   Tobacco Use   Smoking status: Never   Smokeless tobacco: Never  Vaping Use   Vaping status: Never Used  Substance Use Topics   Alcohol use: Not Currently    Comment: past   Drug use: Never  Family Hx: The patient's family history includes Anxiety disorder in his sister.  ROS:   Please see the history of present illness.    Review of Systems  Constitutional: Negative.   HENT: Negative.    Respiratory: Negative.    Cardiovascular: Negative.   Gastrointestinal: Negative.   Musculoskeletal: Negative.   Neurological:  Positive for dizziness and headaches.  Psychiatric/Behavioral: Negative.    All other systems reviewed and are negative.    Labs/Other Tests and Data Reviewed:    Recent Labs: 01/26/2023: TSH 1.040 06/28/2023: ALT 14; BUN 16; Creat 0.91; Hemoglobin 11.9; Platelets 228; Potassium 4.4; Sodium 140   Recent Lipid Panel Lab Results  Component Value Date/Time   CHOL 115 06/28/2023 10:27 AM   TRIG 71 06/28/2023 10:27 AM   HDL 41 06/28/2023 10:27 AM   CHOLHDL 2.8 06/28/2023 10:27 AM   LDLCALC 59 06/28/2023 10:27 AM    Wt Readings from Last 3 Encounters:  08/29/23 226 lb 2 oz (102.6 kg)  08/23/23 224 lb (101.6 kg)  08/17/23 215 lb (97.5 kg)     Exam:    Vital Signs: Vital signs may also be detailed in the HPI BP 108/62 (BP Location: Left Arm, Patient Position: Sitting, Cuff Size: Normal)   Pulse 72   Ht 5' 8 (1.727 m)   Wt 226 lb 2 oz (102.6 kg)   SpO2 97%   BMI 34.38 kg/m   Constitutional:   oriented to person, place, and time. No distress.  HENT:  Head: Grossly normal Eyes:  no discharge. No scleral icterus.  Neck: No JVD, no carotid bruits  Cardiovascular: Regular rate and rhythm, no murmurs appreciated Pulmonary/Chest: Clear to auscultation bilaterally, no wheezes or rails Abdominal: Soft.  no distension.  no tenderness.  Musculoskeletal: Normal range of motion Neurological:  normal muscle tone. Coordination normal. No atrophy Skin: Skin warm and dry Psychiatric: normal affect, pleasant  ASSESSMENT & PLAN:    Atherosclerosis of native coronary artery of native heart with stable angina pectoris (HCC) On aspirin  Plavix  (in light of prior strokes) Recent cardiac catheterization reviewed, PTCA to vein graft to the OM Denies significant anginal symptoms  Hx of CABG Recent catheterization reviewed No further workup at this time  Mixed hyperlipidemia Cholesterol is at goal on the current lipid regimen. No changes to the medications were made.  Benign essential HTN Blood pressure is well controlled on today's visit. No changes made to the medications.  History of cerebrovascular accident (CVA) with residual deficit On aspirin  Plavix , cholesterol at goal, no significant arrhythmia on loop monitor download  Pulmonary fibrosis Pulmonary records were reviewed Followed by pulmonary, CTHR  pending    Signed, Evalene Lunger, MD  08/29/2023 3:18 PM    [facility] [address], [place], [place] [address]

## 2023-08-29 ENCOUNTER — Ambulatory Visit: Attending: Cardiovascular Disease | Admitting: Cardiovascular Disease

## 2023-08-29 ENCOUNTER — Encounter: Payer: Self-pay | Admitting: Cardiovascular Disease

## 2023-08-29 VITALS — BP 108/62 | HR 72 | Ht 68.0 in | Wt 226.1 lb

## 2023-08-29 DIAGNOSIS — I10 Essential (primary) hypertension: Secondary | ICD-10-CM

## 2023-08-29 DIAGNOSIS — I25118 Atherosclerotic heart disease of native coronary artery with other forms of angina pectoris: Secondary | ICD-10-CM

## 2023-08-29 DIAGNOSIS — I63412 Cerebral infarction due to embolism of left middle cerebral artery: Secondary | ICD-10-CM | POA: Diagnosis not present

## 2023-08-29 DIAGNOSIS — R0600 Dyspnea, unspecified: Secondary | ICD-10-CM

## 2023-08-29 DIAGNOSIS — I5020 Unspecified systolic (congestive) heart failure: Secondary | ICD-10-CM | POA: Diagnosis not present

## 2023-08-29 DIAGNOSIS — Z951 Presence of aortocoronary bypass graft: Secondary | ICD-10-CM

## 2023-08-29 DIAGNOSIS — R0602 Shortness of breath: Secondary | ICD-10-CM

## 2023-08-29 NOTE — Patient Instructions (Signed)

## 2023-09-16 DIAGNOSIS — M79675 Pain in left toe(s): Secondary | ICD-10-CM | POA: Diagnosis not present

## 2023-09-16 DIAGNOSIS — B351 Tinea unguium: Secondary | ICD-10-CM | POA: Diagnosis not present

## 2023-09-16 DIAGNOSIS — M79674 Pain in right toe(s): Secondary | ICD-10-CM | POA: Diagnosis not present

## 2023-09-26 ENCOUNTER — Other Ambulatory Visit: Payer: Self-pay | Admitting: Pharmacist

## 2023-09-26 DIAGNOSIS — I10 Essential (primary) hypertension: Secondary | ICD-10-CM

## 2023-09-26 NOTE — Progress Notes (Signed)
   09/26/2023  Patient ID: Luke Mckee, male   DOB: 1940/05/03, 83 y.o.   MRN: [id number]  Receive message from Nurse Care Manager Jackson Acron requesting call to patient, spouse and daughter to follow up regarding pill packaging to aid with adherence.  Note have reviewed with patient/spouse local pharmacies that offer adherence packaging.  Note patient has both [employer] and [employer]are prescription coverage. Per review of pharmacy list from [employer], appears that [facility] is in-network  Both patient/spouse and daughter Luke Mckee ready to make change to pill packaging. Provide daughter with phone number for [facility] and schedule Office Visit to review medications with patient, spouse and caregiver in person in order to provide an updated medication list to pharmacy. - Prior to appointment daughter plans to contact [facility] to receive latest information regarding pharmacy's fees for packaging service and delivery.   Follow Up Plan: Clinical Pharmacist will meet with patient, spouse and daughter in office on 09/28/2023 at 2:00 PM  Sharyle Sia, PharmD, [name], CPP Clinical Pharmacist [facility] [phone]

## 2023-09-26 NOTE — Patient Instructions (Signed)
 I look forward to seeing you in office on 09/28/2023 at 2:00 PM.  Please bring all of your medications with you to this appointment.  In the meantime, the following is the contact information for [facility] in case you have further questions about their pharmacy or pill packaging service:  [facility] [address] [place], [place] [address] [phone]   Thank you!  Sharyle Sia, PharmD, [name], CPP Clinical Pharmacist [facility] [phone]

## 2023-09-28 ENCOUNTER — Ambulatory Visit (INDEPENDENT_AMBULATORY_CARE_PROVIDER_SITE_OTHER)

## 2023-09-28 ENCOUNTER — Other Ambulatory Visit (INDEPENDENT_AMBULATORY_CARE_PROVIDER_SITE_OTHER): Admitting: Pharmacist

## 2023-09-28 ENCOUNTER — Encounter: Payer: Self-pay | Admitting: Pharmacist

## 2023-09-28 DIAGNOSIS — J3089 Other allergic rhinitis: Secondary | ICD-10-CM

## 2023-09-28 DIAGNOSIS — Z23 Encounter for immunization: Secondary | ICD-10-CM | POA: Diagnosis not present

## 2023-09-28 DIAGNOSIS — I25810 Atherosclerosis of coronary artery bypass graft(s) without angina pectoris: Secondary | ICD-10-CM

## 2023-09-28 NOTE — Patient Instructions (Signed)
 It was a pleasure meeting with you today!   Please follow up with [facility] as planned to start adherence packaging.   [address] [facility] [address] [place], [place] [address] [phone]   Also, the following is the how to use video for the Wixela inhaler from your Pulmonologist:   [email or web address]   Please copy and paste the web address above into your web browser to review this video. Please let me know if you have any further medication related questions or concerns   Sharyle Sia, PharmD, [name], CPP Clinical Pharmacist [facility] [phone]

## 2023-09-28 NOTE — Progress Notes (Signed)
 09/28/2023 Name: Luke Mckee MRN: [id number] DOB: 1941/01/05  Chief Complaint  Patient presents with   Medication Adherence    Luke Mckee is a 83 y.o. year old male who was referred for medication management by their primary care provider, Edman Marsa PARAS, DO. They presented for a face to face visit today. Patient is accompanied today by his wife, as well as his daughter, [name], and granddaughter.   They were referred to the pharmacist by their PCP for assistance in managing complex medication management and medication adherence     Subjective:   Care Team: Primary Care Provider: Edman Marsa PARAS, DO ; Next Scheduled Visit: 01/17/2024 Cardiologist: Perla Evalene PARAS, MD Neurologist: Jonette Lauraine Norris, PA Nurse Care Manager: Karoline Lima, RN; Next Scheduled Visit: 11/01/2023 Pulmonologist: Isadora Hose, MD; Next Scheduled Visit: 11/07/2023    Medication Access/Adherence  Current Pharmacy:  [facility] [phone] - [place], [place] - [address] AT [facility] [address] [place] [address] Phone: [phone] Fax: [phone]  [facility] [address] [place] [address] Phone: [phone] Fax: [phone]   Patient reports affordability concerns with their medications: No  Patient reports access/transportation concerns to their pharmacy: No  Patient reports adherence concerns with their medications:  Yes    Patient here today with family to discuss getting setup for pill packaging with [facility]  Shortness of Breath/Seasonal allergies:  Patient followed by [facility]  Current treatment: Wixela 100-50 mcg/act - 1 puff twice daily - find that patient has not yet started Albuterol  HFA inahler - 1-2 puffs every 4 hours as needed Loratadine  10 mg daily Find patient also has been taking cetirizine 10 mg daily - in his pillbox today Montelukast  10  mg QHS  From review of chart, note patient seen by Pulmonologist on 7/16 and advised to start maintenance inhaler. Today find that patient has not yet started Wixela (has unopened inhaler with him here today)   Objective:   Lab Results  Component Value Date   CREATININE 0.91 06/28/2023   BUN 16 06/28/2023   NA 140 06/28/2023   K 4.4 06/28/2023   CL 101 06/28/2023   CO2 31 06/28/2023    Lab Results  Component Value Date   CHOL 115 06/28/2023   HDL 41 06/28/2023   LDLCALC 59 06/28/2023   TRIG 71 06/28/2023   CHOLHDL 2.8 06/28/2023    Medications Reviewed Today     Reviewed by Alana Sharyle LABOR, RPH-CPP (Pharmacist) on 09/28/23 at 1502  Med List Status: <None>   Medication Order Taking? Sig Documenting Provider Last Dose Status Informant  acetaminophen  (TYLENOL ) 650 MG CR tablet [id number] Yes Take 1,300 mg by mouth at bedtime as needed for pain. [provider]  Active Spouse/Significant Other  albuterol  (VENTOLIN  HFA) 108 (90 Base) MCG/ACT inhaler [id number]  Inhale 1-2 puffs into the lungs every 4 (four) hours as needed for wheezing or shortness of breath (cough). Edman Marsa PARAS, DO  Active Spouse/Significant Other           Med Note Luke Mckee Luke Mckee 8, 2025 12:53 PM) > 1 month since last used  aspirin  EC 81 MG tablet [id number] Yes Take 81 mg by mouth daily. [provider]  Active Spouse/Significant Other  atenolol  (TENORMIN ) 25 MG tablet [id number] Yes Take 1 tablet (25 mg total) by mouth daily.  Patient taking differently: Take 25  mg by mouth every morning.   Edman Marsa PARAS, DO  Active Spouse/Significant Other           Med Note HALLIE, FELECIA N   Wed Feb 16, 2023 11:27 AM) Reports taking in the morning as advised by Cardiology team  atorvastatin  (LIPITOR ) 80 MG tablet [id number] Yes TAKE 1 TABLET(80 MG) BY MOUTH DAILY Edman, Marsa PARAS, DO  Active   Calcium  Carb-Cholecalciferol (CALCIUM  600 + D PO) [id number] Yes  Take 1 tablet by mouth daily. [provider]  Active Spouse/Significant Other  Cholecalciferol (VITAMIN D) 50 MCG (2000 UT) tablet [id number] Yes Take 2,000 Units by mouth daily. [provider]  Active Spouse/Significant Other  clopidogrel  (PLAVIX ) 75 MG tablet [id number] Yes Take 1 tablet (75 mg total) by mouth daily. Vivienne Lonni Ingle, NP  Active   clotrimazole -betamethasone  (LOTRISONE ) cream [id number] Yes Apply twice a day for 1-2 weeks, Mckee repeat if need in future Edman Marsa PARAS, DO  Active Spouse/Significant Other           Med Note Luke LYNWOOD HERO   Thu Mckee 8, 2025  1:17 PM) > 1 month since last used    Coenzyme Q10 100 MG TABS [id number] Yes Take 100 mg by mouth daily. [provider]  Active Spouse/Significant Other  colchicine  0.6 MG tablet [id number]  For acute gout flare take 2 pills at once, then take a 3rd pill 2 hours later. Then take 1 pill daily for 7-10 days or until resolved. Edman Marsa PARAS, DO  Active Spouse/Significant Other           Med Note Luke LYNWOOD HERO Luke Mckee 8, 2025  1:18 PM) > 1 month since last used  diclofenac sodium (VOLTAREN) 1 % GEL [id number] Yes Apply 2 g topically 4 (four) times daily as needed for pain. Foot pain [provider]  Active Spouse/Significant Other           Med Note Luke LYNWOOD HERO   Thu Mckee 8, 2025  1:18 PM) > 1 month since last used  escitalopram  (LEXAPRO ) 10 MG tablet [id number] Yes Take 1 tablet (10 mg total) by mouth daily. Karamalegos, Marsa PARAS, DO  Active   fluticasone -salmeterol [facility] INHUB) 100-50 MCG/ACT AEPB [id number] Yes Inhale 1 puff into the lungs 2 (two) times daily. Isadora Hose, MD  Active   furosemide  (LASIX ) 80 MG tablet [id number] Yes Take 1 tablet (80 mg total) by mouth daily. Mckee take 2nd dose AS NEEDED for fluid retention. Edman Marsa PARAS, DO  Active   ipratropium (ATROVENT ) 0.06 % nasal spray [id number] Yes Place 2 sprays into both nostrils  4 (four) times daily as needed for rhinitis. Edman Marsa PARAS, DO  Active Spouse/Significant Other           Med Note Luke LYNWOOD HERO Luke Mckee 8, 2025  1:19 PM) > 1 month since last used  levothyroxine  (SYNTHROID ) 125 MCG tablet [id number]  Take 1 tablet (125 mcg total) by mouth daily before breakfast. Edman Marsa PARAS, DO  Active   loratadine  (CLARITIN ) 10 MG tablet [id number] Yes Take 10 mg by mouth daily. [provider]  Active Spouse/Significant Other  meclizine  (ANTIVERT ) 12.5 MG tablet [id number] Yes Take 1 tablet (12.5 mg total) by mouth 3 (three) times daily as needed for dizziness. Edman Marsa PARAS, DO  Active Spouse/Significant Other           Med Note Luke LYNWOOD HERO Luke Mckee 8, 2025  1:21 PM) > 1 month since last used  montelukast  (SINGULAIR ) 10 MG tablet [id number] Yes TAKE 1 TABLET(10 MG) BY MOUTH AT BEDTIME Edman Marsa PARAS, DO  Active   Multiple Vitamin (MULTIVITAMIN) tablet [id number] Yes Take 1 tablet by mouth daily.  Patient taking differently: Take 1 tablet by mouth every evening.   [provider]  Active Spouse/Significant Other  nitroGLYCERIN  (NITROSTAT ) 0.4 MG SL tablet [id number]  Place 1 tablet (0.4 mg total) under the tongue every 5 (five) minutes as needed for chest pain. Edman Marsa PARAS, DO  Active Spouse/Significant Other           Med Note JACKOLYN, ZEA J   Mon Jun 20, 2023  2:22 PM) Prn  PATADAY 0.1 % ophthalmic solution [id number]  Place 1 drop into both eyes daily as needed for allergies.  Patient not taking: Reported on 09/28/2023   [provider]  Active Spouse/Significant Other  sennosides-docusate sodium  (SENOKOT-S) 8.6-50 MG tablet [id number] Yes Take 2 tablets by mouth daily.  Patient taking differently: Take 2 tablets by mouth at bedtime as needed.   [provider]  Active Spouse/Significant Other   Patient not taking:   Discontinued 09/28/23 1502 (Patient Preference)    traZODone  (DESYREL ) 50 MG tablet [id number] Yes TAKE 1 TABLET(50 MG) BY MOUTH AT BEDTIME Edman Marsa PARAS, DO  Active               Assessment/Plan:   Comprehensive medication review performed; medication list updated in electronic medical record  Fax updated medication list to [facility] for patient today. Outreach to [employer] and speak with Emergency planning/management officer. Confirm medication list received and request pharmacy transfer active prescriptions from [facility]  Will collaborate with PCP to ask provider to send new prescriptions for albuterol , atenolol , atorvastatin , escitalopram , furosemide , levothyroxine , montelukast  and trazodone  to pharmacy for patient  Shortness of Breath/Seasonal allergies:  - Reviewed appropriate Wixela inhaler technique. Encourage patient to use maintenance inhaler consistently Including importance of rinsing and spitting out after each use - Identify patient has been taking both cetirizine AND loratadine  daily, rather than just loratadine . Admits to daytime sedation. Recommend patient stop cetirizine, rather taking loratadine  daily (as well as montelukast  nightly) for allergy symptoms to reduce sedation risk   Follow Up Plan: Clinical Pharmacist will follow up with patient/caregiver by telephone on 10/12/2023 at 1:30 PM   Sharyle Sia, PharmD, [name], CPP Clinical Pharmacist [facility] [phone]

## 2023-09-29 ENCOUNTER — Other Ambulatory Visit: Payer: Self-pay | Admitting: Family Medicine

## 2023-09-29 DIAGNOSIS — I25810 Atherosclerosis of coronary artery bypass graft(s) without angina pectoris: Secondary | ICD-10-CM

## 2023-09-29 DIAGNOSIS — I10 Essential (primary) hypertension: Secondary | ICD-10-CM

## 2023-09-29 DIAGNOSIS — R0600 Dyspnea, unspecified: Secondary | ICD-10-CM

## 2023-09-29 DIAGNOSIS — J3089 Other allergic rhinitis: Secondary | ICD-10-CM

## 2023-09-29 DIAGNOSIS — F5104 Psychophysiologic insomnia: Secondary | ICD-10-CM

## 2023-09-29 DIAGNOSIS — E782 Mixed hyperlipidemia: Secondary | ICD-10-CM

## 2023-09-29 DIAGNOSIS — F419 Anxiety disorder, unspecified: Secondary | ICD-10-CM

## 2023-09-29 DIAGNOSIS — E039 Hypothyroidism, unspecified: Secondary | ICD-10-CM

## 2023-09-29 MED ORDER — LEVOTHYROXINE SODIUM 125 MCG PO TABS: 125.0000 ug | ORAL_TABLET | Freq: Every day | ORAL | 1 refills | Status: DC

## 2023-09-29 MED ORDER — TRAZODONE HCL 50 MG PO TABS: 50.0000 mg | ORAL_TABLET | Freq: Every day | ORAL | 1 refills | Status: DC

## 2023-09-29 MED ORDER — ATORVASTATIN CALCIUM 80 MG PO TABS: 80.0000 mg | ORAL_TABLET | Freq: Every day | ORAL | 1 refills | Status: DC

## 2023-09-29 MED ORDER — MONTELUKAST SODIUM 10 MG PO TABS: 10.0000 mg | ORAL_TABLET | Freq: Every day | ORAL | 1 refills | Status: DC

## 2023-09-29 MED ORDER — ESCITALOPRAM OXALATE 10 MG PO TABS: 10.0000 mg | ORAL_TABLET | Freq: Every day | ORAL | 1 refills | Status: DC

## 2023-09-29 MED ORDER — ALBUTEROL SULFATE HFA 108 (90 BASE) MCG/ACT IN AERS: 1.0000 | INHALATION_SPRAY | RESPIRATORY_TRACT | 3 refills | Status: AC | PRN

## 2023-09-29 MED ORDER — FUROSEMIDE 80 MG PO TABS: 80.0000 mg | ORAL_TABLET | Freq: Every day | ORAL | 1 refills | Status: DC

## 2023-09-29 MED ORDER — ATENOLOL 25 MG PO TABS: 25.0000 mg | ORAL_TABLET | Freq: Every morning | ORAL | 1 refills | Status: DC

## 2023-10-03 ENCOUNTER — Other Ambulatory Visit: Payer: Self-pay | Admitting: Pharmacist

## 2023-10-03 DIAGNOSIS — R0600 Dyspnea, unspecified: Secondary | ICD-10-CM

## 2023-10-03 NOTE — Patient Instructions (Signed)
 You can find more information about the [employer] and Seniors' [employer] Information Program [facility]) here:   [email or web address]   You can reach the [employer] for [facility] by calling:   Idella Major [facility] [address]     [place]  [address] [phone]     Ask for help with [facility]    Thank you!   Arthur Lash, PharmD, Becky Bowels, CPP Clinical Pharmacist [facility] [phone]

## 2023-10-03 NOTE — Progress Notes (Signed)
   10/03/2023  Patient ID: Luke Mckee, male   DOB: 17-Dec-1940, 83 y.o.   MRN: [id number]  Receive a call from patient's daughter, Luke Mckee (listed on DPR), advising that she followed up with Luke Mckee at [facility] regarding start of pill packaging for patient and was advised that pharmacy was unable to proceed with pill packaging for patient due patient's insurance coverage/cost.  Outreach to [facility] today on behalf of patient. Speak with Luke Mckee who advises that since patient does not have Medicare Part D (only Part B), reviewed patient's coverage through T[employer]are and [facility] is not in the preferred network for this coverage.  Follow up with [facility] in [facility] and confirm this pharmacy (has previously been filling medications for patient) has been billing only Tricare, no other coverage.  Follow up with daughter to provide this update. Luke Mckee verbalizes understanding and shares that for now, she and family have taken over filling weekly pillboxes/medication management for patient and spouse and feels like this is going well.   Reports patient feeling better since now since again stopped cetirizine, rather taking just loratadine  daily (as well as montelukast  nightly) for allergy symptoms, less day-time sedation.   Confirms patient has started using Wixela inhaler as directed  Recommend caregiver/patient follow up with [employer] to discuss preferred pharmacy network/determine if an alternative pill packaging pharmacy is in preferred network for patient. Luke Mckee states that she will make this call tomorrow when she is with the patient, but that if no pharmacy that offers pill packaging is in preferred network, then she and her daughter will continue filling weekly pillboxes/medication management for patient for now (if so, would contact [facility] to have prescriptions transferred from [facility])  Caregiver asks about option for patient to sign up for Medicare  prescription coverage in the future. Encourage for caregiver/patient to make appointment with Seniors? Health Insurance Information Program [facility]) counselor to consider whether this option is beneficial for patient.   Follow Up Plan: Clinical Pharmacist will follow up with patient/caregiver by telephone on 10/12/2023 at 1:30 PM    Luke Mckee, PharmD, [name], CPP Clinical Pharmacist [facility] [phone]

## 2023-10-04 ENCOUNTER — Other Ambulatory Visit (HOSPITAL_COMMUNITY): Payer: Self-pay

## 2023-10-04 ENCOUNTER — Telehealth: Payer: Self-pay | Admitting: Pharmacy Technician

## 2023-10-04 NOTE — Telephone Encounter (Signed)
 Pharmacy Patient Advocate Encounter   Received notification from Onbase that prior authorization for Trazodone  50mg  tablets is required/requested.   Insurance verification completed.   The patient is insured through [employer] .   Per test claim: The current 90 day co-pay is, $1.76.  No PA needed at this time. This test claim was processed through [facility]- copay amounts may vary at other pharmacies due to pharmacy/plan contracts, or as the patient moves through the different stages of their insurance plan.    Called [facility] and they advised that no PA is needed.

## 2023-10-05 ENCOUNTER — Other Ambulatory Visit: Payer: Self-pay | Admitting: Family Medicine

## 2023-10-05 DIAGNOSIS — J3089 Other allergic rhinitis: Secondary | ICD-10-CM

## 2023-10-06 NOTE — Telephone Encounter (Signed)
 Duplicate request, too soon for refill.  Requested Prescriptions  Pending Prescriptions Disp Refills   montelukast  (SINGULAIR ) 10 MG tablet [Pharmacy Med Name: MONTELUKAST  10MG  TABLETS] 90 tablet 1    Sig: TAKE 1 TABLET(10 MG) BY MOUTH AT BEDTIME     Pulmonology:  Leukotriene Inhibitors Passed - 10/06/2023  1:26 PM      Passed - Valid encounter within last 12 months    Recent Outpatient Visits           2 months ago History of cerebrovascular accident (CVA) with residual deficit   [place] [facility] [place], Marsa PARAS, DO   6 months ago Coronary artery disease involving native coronary artery of native heart with angina pectoris   [place] [facility] [place], [place] PARAS, [place]

## 2023-10-07 ENCOUNTER — Other Ambulatory Visit: Payer: Self-pay | Admitting: Family Medicine

## 2023-10-07 DIAGNOSIS — E039 Hypothyroidism, unspecified: Secondary | ICD-10-CM

## 2023-10-07 MED ORDER — LEVOTHYROXINE SODIUM 125 MCG PO TABS: 125.0000 ug | ORAL_TABLET | Freq: Every day | ORAL | 1 refills | Status: DC

## 2023-10-07 NOTE — Telephone Encounter (Signed)
 Copied from CRM #[id number]. Topic: Clinical - Medication Refill >> Oct 07, 2023  9:10 AM Delon HERO wrote: Medication: levothyroxine  (SYNTHROID ) 125 MCG tablet [[id number]]  Has the patient contacted their pharmacy? Yes (Agent: If no, request that the patient contact the pharmacy for the refill. If patient does not wish to contact the pharmacy document the reason why and proceed with request.) (Agent: If yes, when and what did the pharmacy advise?)  This is the patient's preferred pharmacy:  Patient requesting pill pack previously sent to [facility]   [id number] [place], [place] - [address] AT [facility] [address] [place] [address] Phone: [phone] Fax: [phone]    Is this the correct pharmacy for this prescription? Yes If no, delete pharmacy and type the correct one.   Has the prescription been filled recently? Yes  Is the patient out of the medication? Yes  Has the patient been seen for an appointment in the last year OR does the patient have an upcoming appointment? Yes  Can we respond through MyChart? Yes  Agent: Please be advised that Rx refills may take up to 3 business days. We ask that you follow-up with your pharmacy.

## 2023-10-07 NOTE — Telephone Encounter (Signed)
 Requested Prescriptions  Pending Prescriptions Disp Refills   levothyroxine  (SYNTHROID ) 125 MCG tablet 90 tablet 1    Sig: Take 1 tablet (125 mcg total) by mouth daily before breakfast.     Endocrinology:  Hypothyroid Agents Passed - 10/07/2023  5:18 PM      Passed - TSH in normal range and within 360 days    TSH  Date Value Ref Range Status  01/26/2023 1.040 0.450 - 4.500 uIU/mL Final         Passed - Valid encounter within last 12 months    Recent Outpatient Visits           2 months ago History of cerebrovascular accident (CVA) with residual deficit   [place] [facility] [place], Marsa PARAS, DO   6 months ago Coronary artery disease involving native coronary artery of native heart with angina pectoris   [facility] [place], [place] PARAS, [place]

## 2023-10-12 ENCOUNTER — Other Ambulatory Visit: Admitting: Pharmacist

## 2023-10-12 DIAGNOSIS — I25810 Atherosclerosis of coronary artery bypass graft(s) without angina pectoris: Secondary | ICD-10-CM

## 2023-10-12 DIAGNOSIS — I10 Essential (primary) hypertension: Secondary | ICD-10-CM

## 2023-10-12 NOTE — Progress Notes (Unsigned)
 10/12/2023 Name: Luke Mckee MRN: [id number] DOB: Aug 11, 1940  Chief Complaint  Patient presents with   Medication Adherence   Medication Management    Luke Mckee is a 83 y.o. year old male who was referred to the pharmacist by their PCP for assistance in managing complex medication management and medication adherence   Today speak with patient's daughter, Luke Mckee (listed on DPR)   Subjective:   Care Team: Primary Care Provider: Edman Marsa PARAS, DO ; Next Scheduled Visit: 01/17/2024 Cardiologist: Perla Evalene PARAS, MD Neurologist: Jonette Lauraine Norris, PA Nurse Care Manager: Luke Lima, RN; Next Scheduled Visit: 11/01/2023 Pulmonologist: Isadora Hose, MD; Next Scheduled Visit: 11/07/2023   Medication Access/Adherence  Current Pharmacy:  [facility] [phone] - [place], [place] - [address] AT [facility] [address] [place] [address] Phone: [phone] Fax: [phone]  [facility] [address] [place] [address] Phone: [phone] Fax: [phone]   Patient reports affordability concerns with their medications: No  Patient reports access/transportation concerns to their pharmacy: No  Patient reports adherence concerns with their medications:  No   Uses weekly pillbox as now filled by daughter and granddaughter    Shortness of Breath/Seasonal allergies:   Patient followed by [facility]   Current treatment: Wixela 100-50 mcg/act - 1 puff twice Mckee - admits not consistently taking evening dose Albuterol  HFA inahler - 1-2 puffs every 4 hours as needed Loratadine  10 mg Mckee Montelukast  10 mg QHS   Hypertension:   Current medications:  - atenolol  25 mg Mckee in morning - furosemide  80 mg - reports taking once Mckee, but up to twice Mckee as needed   Patient has an automated, upper arm home BP cuff Recalls last checked blood pressure last  week with reading ~123/62   Denies signs of hypotension, such as dizziness recently   Denies signs of swelling today   Current Physical Activity: reports patient more active/having more energy recently   Objective:  Lab Results  Component Value Date   CREATININE 0.91 06/28/2023   BUN 16 06/28/2023   NA 140 06/28/2023   K 4.4 06/28/2023   CL 101 06/28/2023   CO2 31 06/28/2023    Lab Results  Component Value Date   CHOL 115 06/28/2023   HDL 41 06/28/2023   LDLCALC 59 06/28/2023   TRIG 71 06/28/2023   CHOLHDL 2.8 06/28/2023   BP Readings from Last 3 Encounters:  08/29/23 108/62  08/23/23 (!) 128/91  08/17/23 128/73   Pulse Readings from Last 3 Encounters:  08/29/23 72  08/17/23 87  07/27/23 68    Current Outpatient Medications on File Prior to Visit  Medication Sig Dispense Refill   acetaminophen  (TYLENOL ) 650 MG CR tablet Take 1,300 mg by mouth at bedtime as needed for pain.     albuterol  (VENTOLIN  HFA) 108 (90 Base) MCG/ACT inhaler Inhale 1-2 puffs into the lungs every 4 (four) hours as needed for wheezing or shortness of breath (cough). 1 each 3   aspirin  EC 81 MG tablet Take 81 mg by mouth Mckee.     atenolol  (TENORMIN ) 25 MG tablet Take 1 tablet (25 mg total) by mouth every morning. 90 tablet 1   atorvastatin  (LIPITOR ) 80 MG tablet Take 1 tablet (80 mg total) by mouth at bedtime. 90 tablet 1   Calcium  Carb-Cholecalciferol (CALCIUM  600 + D PO) Take 1 tablet by mouth Mckee.  Cholecalciferol (VITAMIN D) 50 MCG (2000 UT) tablet Take 2,000 Units by mouth Mckee.     clopidogrel  (PLAVIX ) 75 MG tablet Take 1 tablet (75 mg total) by mouth Mckee. 90 tablet 3   clotrimazole -betamethasone  (LOTRISONE ) cream Apply twice a day for 1-2 weeks, may repeat if need in future 30 g 1   Coenzyme Q10 100 MG TABS Take 100 mg by mouth Mckee.     colchicine  0.6 MG tablet For acute gout flare take 2 pills at once, then take a 3rd pill 2 hours later. Then take 1 pill Mckee for 7-10 days  or until resolved. 30 tablet 2   diclofenac sodium (VOLTAREN) 1 % GEL Apply 2 g topically 4 (four) times Mckee as needed for pain. Foot pain     escitalopram  (LEXAPRO ) 10 MG tablet Take 1 tablet (10 mg total) by mouth Mckee. 90 tablet 1   fluticasone -salmeterol (WIXELA INHUB) 100-50 MCG/ACT AEPB Inhale 1 puff into the lungs 2 (two) times Mckee. 60 each 12   furosemide  (LASIX ) 80 MG tablet Take 1 tablet (80 mg total) by mouth Mckee. May take 2nd dose AS NEEDED for fluid retention. 90 tablet 1   ipratropium (ATROVENT ) 0.06 % nasal spray Place 2 sprays into both nostrils 4 (four) times Mckee as needed for rhinitis. 15 mL 2   levothyroxine  (SYNTHROID ) 125 MCG tablet Take 1 tablet (125 mcg total) by mouth Mckee before breakfast. 90 tablet 1   loratadine  (CLARITIN ) 10 MG tablet Take 10 mg by mouth Mckee.     meclizine  (ANTIVERT ) 12.5 MG tablet Take 1 tablet (12.5 mg total) by mouth 3 (three) times Mckee as needed for dizziness. 30 tablet 1   montelukast  (SINGULAIR ) 10 MG tablet Take 1 tablet (10 mg total) by mouth at bedtime. 90 tablet 1   Multiple Vitamin (MULTIVITAMIN) tablet Take 1 tablet by mouth Mckee. (Patient taking differently: Take 1 tablet by mouth every evening.)     nitroGLYCERIN  (NITROSTAT ) 0.4 MG SL tablet Place 1 tablet (0.4 mg total) under the tongue every 5 (five) minutes as needed for chest pain. 20 tablet 2   PATADAY 0.1 % ophthalmic solution Place 1 drop into both eyes Mckee as needed for allergies. (Patient not taking: Reported on 09/28/2023)     sennosides-docusate sodium  (SENOKOT-S) 8.6-50 MG tablet Take 2 tablets by mouth Mckee. (Patient taking differently: Take 2 tablets by mouth at bedtime as needed.)     traZODone  (DESYREL ) 50 MG tablet Take 1 tablet (50 mg total) by mouth at bedtime. 90 tablet 1   No current facility-administered medications on file prior to visit.     Assessment/Plan:   Caregiver confirms separating multivitamin doses from morning levothyroxine  by several  hours  Shortness of Breath/Seasonal allergies:   - Reviewed appropriate Wixela inhaler technique. Encourage to use maintenance inhaler consistently Including importance of rinsing and spitting out after each use   Hypertension: - Reviewed long term cardiovascular and renal outcomes of uncontrolled blood pressure - Remind patient to continue to take positional changes slowly - Recommend to monitor home blood pressure, keep log of results and have this record to review at upcoming medical appointments. Patient to contact provider office sooner if needed for readings outside of established parameters or symptoms    Follow Up Plan: Clinical Pharmacist will follow up with patient/caregiver by telephone on 12/14/2023 at 1:30 PM    Sharyle Sia, PharmD, [name], CPP Clinical Pharmacist [facility] [phone]

## 2023-10-14 NOTE — Patient Instructions (Signed)
 Goals Addressed             This Visit's Progress    Pharmacy Goals       Check your blood pressure twice weekly, and any time you have concerning symptoms like headache, chest pain, dizziness, shortness of breath, or vision changes.   To appropriately check your blood pressure, make sure you do the following:  1) Avoid caffeine, exercise, or tobacco products for 30 minutes before checking. Empty your bladder. 2) Sit with your back supported in a flat-backed chair. Rest your arm on something flat (arm of the chair, table, etc). 3) Sit still with your feet flat on the floor, resting, for at least 5 minutes.  4) Check your blood pressure. Take 1-2 readings.  5) Write down these readings and bring with you to any provider appointments.  Bring your home blood pressure machine with you to a provider's office for accuracy comparison at least once a year.   Make sure you take your blood pressure medications before you come to any office visit, even if you were asked to fast for labs.   Our goal bad cholesterol, or LDL, is less than 70 . This is why it is important to continue taking your atorvastatin  Feel free to call me with any questions or concerns. I look forward to our next call!   Estelle Grumbles, PharmD, [facility] Clinical Pharmacist [facility] [phone]

## 2023-10-24 ENCOUNTER — Other Ambulatory Visit

## 2023-10-25 ENCOUNTER — Ambulatory Visit
Admission: RE | Admit: 2023-10-25 | Discharge: 2023-10-25 | Disposition: A | Discharge status: Home / Self Care | Source: Ambulatory Visit | Attending: Student in an Organized Health Care Education/Training Program | Admitting: Student in an Organized Health Care Education/Training Program

## 2023-10-25 DIAGNOSIS — J849 Interstitial pulmonary disease, unspecified: Secondary | ICD-10-CM | POA: Diagnosis not present

## 2023-10-25 DIAGNOSIS — I700 Atherosclerosis of aorta: Secondary | ICD-10-CM | POA: Diagnosis not present

## 2023-10-25 DIAGNOSIS — G4733 Obstructive sleep apnea (adult) (pediatric): Secondary | ICD-10-CM | POA: Diagnosis not present

## 2023-10-25 DIAGNOSIS — I2510 Atherosclerotic heart disease of native coronary artery without angina pectoris: Secondary | ICD-10-CM | POA: Diagnosis not present

## 2023-10-25 DIAGNOSIS — R0602 Shortness of breath: Secondary | ICD-10-CM | POA: Insufficient documentation

## 2023-10-25 DIAGNOSIS — D3502 Benign neoplasm of left adrenal gland: Secondary | ICD-10-CM | POA: Diagnosis not present

## 2023-10-31 ENCOUNTER — Telehealth: Payer: Self-pay

## 2023-10-31 DIAGNOSIS — R0602 Shortness of breath: Secondary | ICD-10-CM

## 2023-10-31 MED ORDER — FLUTICASONE-SALMETEROL 100-50 MCG/ACT IN AEPB: 1.0000 | INHALATION_SPRAY | Freq: Two times a day (BID) | RESPIRATORY_TRACT | 12 refills | Status: AC

## 2023-10-31 NOTE — Telephone Encounter (Signed)
 Received CRM by HIPOLITO Sermon with pt's daughter per [facility] and clarified -- pt seeking a renewed inhaler prescription to replace currently broken inhaler at the request of he pharmacy. Pt has upcoming appt on Monday at 11:15 and daughter confirmed she will bring him to this appointment. NFN.

## 2023-11-01 ENCOUNTER — Telehealth

## 2023-11-04 ENCOUNTER — Ambulatory Visit: Payer: Medicare PPO

## 2023-11-04 ENCOUNTER — Other Ambulatory Visit: Payer: Self-pay

## 2023-11-04 DIAGNOSIS — Z0000 Encounter for general adult medical examination without abnormal findings: Secondary | ICD-10-CM | POA: Diagnosis not present

## 2023-11-04 NOTE — Patient Outreach (Signed)
 Complex Care Management   Visit Note  11/04/2023  Name:  Kessler Solly MRN: [id number] DOB: 03-21-1940  Situation: Referral received for Complex Care Management related to Heart Failure and Hypertension. I obtained verbal consent from Patient.  Visit completed with Mr. Bembenek and his spouse via telephone.  Background:   Past Medical History:  Diagnosis Date   Anxiety    Arthritis    CAD (coronary artery disease) 2005   s/p CABG, DES   CHF (congestive heart failure) (HCC)    in EPIC care everywhere   Chronic kidney disease    COPD (chronic obstructive pulmonary disease) (HCC)    in EPIC care everywhere   Depression    Heart murmur    History of stroke    Hypothyroidism    OSA (obstructive sleep apnea)    Paroxysmal A-fib (HCC)    in EPIC careeverywhere     Assessment: Patient Reported Symptoms: Cognitive Cognitive Status: Alert and oriented to person, place, and time, Normal speech and language skills Cognitive/Intellectual Conditions Management [RPT]: None reported or documented in medical history or problem list Health Maintenance Behaviors: Sleep adequate, Spiritual practice(s), Healthy diet, Annual physical exam Healing Pattern: Average Health Facilitated by: Rest  Neurological Neurological Review of Symptoms: No symptoms reported Neurological Management Strategies: Coping strategies, Medication therapy, Routine screening Neurological Self-Management Outcome: 4 (good)  HEENT HEENT Symptoms Reported: No symptoms reported HEENT Self-Management Outcome: 4 (good)  Cardiovascular Cardiovascular Symptoms Reported: No symptoms reported Does patient have uncontrolled Hypertension?: No Cardiovascular Self-Management Outcome: 4 (good)  Respiratory Respiratory Symptoms Reported: No symptoms reported  Endocrine Endocrine Symptoms Reported: No symptoms reported Is patient diabetic?: No  Gastrointestinal Gastrointestinal Symptoms Reported: No symptoms reported Gastrointestinal  Self-Management Outcome: 4 (good)  Genitourinary Genitourinary Symptoms Reported: Other Other Genitourinary Symptoms: Reports interest in using viagra . He is requesting follow up with his Urologist. Attempted to contact the Urology team today. Will follow up next week.  Integumentary Integumentary Symptoms Reported: No symptoms reported Skin Self-Management Outcome: 4 (good)  Musculoskeletal Musculoskelatal Symptoms Reviewed: Other Other Musculoskeletal Symptoms: Reports using cane as needed with ambulation Musculoskeletal Self-Management Outcome: 4 (good)  Psychosocial Psychosocial Symptoms Reported: No symptoms reported Quality of Family Relationships: supportive Do you feel physically threatened by others?: No    There were no vitals filed for this visit. Outpatient Encounter Medications as of 11/04/2023  Medication Sig Note   acetaminophen  (TYLENOL ) 650 MG CR tablet Take 1,300 mg by mouth at bedtime as needed for pain.    albuterol  (VENTOLIN  HFA) 108 (90 Base) MCG/ACT inhaler Inhale 1-2 puffs into the lungs every 4 (four) hours as needed for wheezing or shortness of breath (cough).    aspirin  EC 81 MG tablet Take 81 mg by mouth daily.    atenolol  (TENORMIN ) 25 MG tablet Take 1 tablet (25 mg total) by mouth every morning.    atorvastatin  (LIPITOR ) 80 MG tablet Take 1 tablet (80 mg total) by mouth at bedtime.    Calcium  Carb-Cholecalciferol (CALCIUM  600 + D PO) Take 1 tablet by mouth daily.    Cholecalciferol (VITAMIN D) 50 MCG (2000 UT) tablet Take 2,000 Units by mouth daily.    clopidogrel  (PLAVIX ) 75 MG tablet Take 1 tablet (75 mg total) by mouth daily.    clotrimazole -betamethasone  (LOTRISONE ) cream Apply twice a day for 1-2 weeks, may repeat if need in future 05/19/2023: > 1 month since last used     Coenzyme Q10 100 MG TABS Take 100 mg by mouth daily.  colchicine  0.6 MG tablet For acute gout flare take 2 pills at once, then take a 3rd pill 2 hours later. Then take 1 pill daily for  7-10 days or until resolved. 05/19/2023: > 1 month since last used   diclofenac sodium (VOLTAREN) 1 % GEL Apply 2 g topically 4 (four) times daily as needed for pain. Foot pain 05/19/2023: > 1 month since last used   escitalopram  (LEXAPRO ) 10 MG tablet Take 1 tablet (10 mg total) by mouth daily.    fluticasone -salmeterol (WIXELA INHUB) 100-50 MCG/ACT AEPB Inhale 1 puff into the lungs 2 (two) times daily.    furosemide  (LASIX ) 80 MG tablet Take 1 tablet (80 mg total) by mouth daily. May take 2nd dose AS NEEDED for fluid retention.    ipratropium (ATROVENT ) 0.06 % nasal spray Place 2 sprays into both nostrils 4 (four) times daily as needed for rhinitis. 05/19/2023: > 1 month since last used   levothyroxine  (SYNTHROID ) 125 MCG tablet Take 1 tablet (125 mcg total) by mouth daily before breakfast.    loratadine  (CLARITIN ) 10 MG tablet Take 10 mg by mouth daily.    meclizine  (ANTIVERT ) 12.5 MG tablet Take 1 tablet (12.5 mg total) by mouth 3 (three) times daily as needed for dizziness. 05/19/2023: > 1 month since last used   montelukast  (SINGULAIR ) 10 MG tablet Take 1 tablet (10 mg total) by mouth at bedtime.    Multiple Vitamin (MULTIVITAMIN) tablet Take 1 tablet by mouth daily.    nitroGLYCERIN  (NITROSTAT ) 0.4 MG SL tablet Place 1 tablet (0.4 mg total) under the tongue every 5 (five) minutes as needed for chest pain. 06/20/2023: Prn   PATADAY 0.1 % ophthalmic solution Place 1 drop into both eyes daily as needed for allergies.    sennosides-docusate sodium  (SENOKOT-S) 8.6-50 MG tablet Take 2 tablets by mouth daily.    traZODone  (DESYREL ) 50 MG tablet Take 1 tablet (50 mg total) by mouth at bedtime.    No facility-administered encounter medications on file as of 11/04/2023.      Recommendation:   Continue Current Plan of Care  Follow Up Plan:   Telephone follow up appointment     [facility] RN Care Manager Direct Dial: [phone]  Fax: [phone] Website:  [email or web address]

## 2023-11-04 NOTE — Progress Notes (Signed)
 Subjective:   Luke Mckee is a 83 y.o. who presents for a Medicare Wellness preventive visit.  As a reminder, Annual Wellness Visits don't include a physical exam, and some assessments may be limited, especially if this visit is performed virtually. We may recommend an in-person follow-up visit with your provider if needed.  Visit Complete: Virtual I connected with  [name] on 11/04/23 by a audio enabled telemedicine application and verified that I am speaking with the correct person using two identifiers.  Patient Location: Home  Provider Location: Home Office  I discussed the limitations of evaluation and management by telemedicine. The patient expressed understanding and agreed to proceed.  Vital Signs: Because this visit was a virtual/telehealth visit, some criteria may be missing or patient reported. Any vitals not documented were not able to be obtained and vitals that have been documented are patient reported.  VideoDeclined- This patient declined Librarian, academic. Therefore the visit was completed with audio only.  Persons Participating in Visit: Patient.  AWV Questionnaire: No: Patient Medicare AWV questionnaire was not completed prior to this visit.  Cardiac Risk Factors include: advanced age (>75men, >69 women);dyslipidemia;hypertension;male gender;sedentary lifestyle;obesity (BMI >30kg/m2)     Objective:    There were no vitals filed for this visit. There is no height or weight on file to calculate BMI.     11/04/2023    4:09 PM 05/19/2023    7:54 PM 05/19/2023    1:01 PM 10/22/2022    3:12 PM 07/29/2022    2:08 PM 09/25/2021    2:43 PM 04/14/2021   11:51 AM  Advanced Directives  Does Patient Have a Medical Advance Directive? No Yes Yes Yes Yes No No  Type of Special educational needs teacher of [place];Living will Healthcare Power of [place];Living will Healthcare Power of [employer] Power of Attorney    Does patient  want to make changes to medical advance directive?  No - Patient declined  Yes (Inpatient - patient defers changing a medical advance directive and declines information at this time)     Copy of Healthcare Power of Attorney in Chart?  No - copy requested  No - copy requested     Would patient like information on creating a medical advance directive? No - Patient declined   No - Patient declined No - Guardian declined No - Patient declined No - Patient declined    Current Medications (verified) Outpatient Encounter Medications as of 11/04/2023  Medication Sig   acetaminophen  (TYLENOL ) 650 MG CR tablet Take 1,300 mg by mouth at bedtime as needed for pain.   albuterol  (VENTOLIN  HFA) 108 (90 Base) MCG/ACT inhaler Inhale 1-2 puffs into the lungs every 4 (four) hours as needed for wheezing or shortness of breath (cough).   aspirin  EC 81 MG tablet Take 81 mg by mouth daily.   atenolol  (TENORMIN ) 25 MG tablet Take 1 tablet (25 mg total) by mouth every morning.   atorvastatin  (LIPITOR ) 80 MG tablet Take 1 tablet (80 mg total) by mouth at bedtime.   Calcium  Carb-Cholecalciferol (CALCIUM  600 + D PO) Take 1 tablet by mouth daily.   Cholecalciferol (VITAMIN D) 50 MCG (2000 UT) tablet Take 2,000 Units by mouth daily.   clopidogrel  (PLAVIX ) 75 MG tablet Take 1 tablet (75 mg total) by mouth daily.   clotrimazole -betamethasone  (LOTRISONE ) cream Apply twice a day for 1-2 weeks, may repeat if need in future   Coenzyme Q10 100 MG TABS Take 100 mg by mouth daily.  colchicine  0.6 MG tablet For acute gout flare take 2 pills at once, then take a 3rd pill 2 hours later. Then take 1 pill daily for 7-10 days or until resolved.   diclofenac sodium (VOLTAREN) 1 % GEL Apply 2 g topically 4 (four) times daily as needed for pain. Foot pain   escitalopram  (LEXAPRO ) 10 MG tablet Take 1 tablet (10 mg total) by mouth daily.   fluticasone -salmeterol (WIXELA INHUB) 100-50 MCG/ACT AEPB Inhale 1 puff into the lungs 2 (two) times  daily.   furosemide  (LASIX ) 80 MG tablet Take 1 tablet (80 mg total) by mouth daily. May take 2nd dose AS NEEDED for fluid retention.   ipratropium (ATROVENT ) 0.06 % nasal spray Place 2 sprays into both nostrils 4 (four) times daily as needed for rhinitis.   levothyroxine  (SYNTHROID ) 125 MCG tablet Take 1 tablet (125 mcg total) by mouth daily before breakfast.   loratadine  (CLARITIN ) 10 MG tablet Take 10 mg by mouth daily.   meclizine  (ANTIVERT ) 12.5 MG tablet Take 1 tablet (12.5 mg total) by mouth 3 (three) times daily as needed for dizziness.   montelukast  (SINGULAIR ) 10 MG tablet Take 1 tablet (10 mg total) by mouth at bedtime.   Multiple Vitamin (MULTIVITAMIN) tablet Take 1 tablet by mouth daily.   nitroGLYCERIN  (NITROSTAT ) 0.4 MG SL tablet Place 1 tablet (0.4 mg total) under the tongue every 5 (five) minutes as needed for chest pain.   PATADAY 0.1 % ophthalmic solution Place 1 drop into both eyes daily as needed for allergies.   sennosides-docusate sodium  (SENOKOT-S) 8.6-50 MG tablet Take 2 tablets by mouth daily.   traZODone  (DESYREL ) 50 MG tablet Take 1 tablet (50 mg total) by mouth at bedtime.   No facility-administered encounter medications on file as of 11/04/2023.    Allergies (verified) Bee pollen, Pollen extract, Ace inhibitors, Cephalexin, Crestor [rosuvastatin calcium ], and Tape   History: Past Medical History:  Diagnosis Date   Anxiety    Arthritis    CAD (coronary artery disease) 2005   s/p CABG, DES   CHF (congestive heart failure) (HCC)    in EPIC care everywhere   Chronic kidney disease    COPD (chronic obstructive pulmonary disease) (HCC)    in EPIC care everywhere   Depression    Heart murmur    History of stroke    Hypothyroidism    OSA (obstructive sleep apnea)    Paroxysmal A-fib (HCC)    in EPIC careeverywhere   Past Surgical History:  Procedure Laterality Date   APPENDECTOMY     BUNIONECTOMY     CATARACT EXTRACTION     Right eye   CATARACT  EXTRACTION W/PHACO Left 01/19/2021   Procedure: CATARACT EXTRACTION PHACO AND INTRAOCULAR LENS PLACEMENT (IOC) LEFT 3.09 00:28.3;  Surgeon: Myrna Adine Anes, MD;  Location: [facility];  Service: Ophthalmology;  Laterality: Left;   COLONOSCOPY  07/06/2004   CORONARY ARTERY BYPASS GRAFT  2005   CORONARY BALLOON ANGIOPLASTY N/A 05/19/2023   Procedure: CORONARY BALLOON ANGIOPLASTY;  Surgeon: Anner Alm ORN, MD;  Location: [facility];  Service: Cardiovascular;  Laterality: N/A;   LOOP RECORDER INSERTION N/A 07/25/2018   Procedure: LOOP RECORDER INSERTION;  Surgeon: Inocencio Soyla Lunger, MD;  Location: [facility];  Service: Cardiovascular;  Laterality: N/A;   RIGHT/LEFT HEART CATH AND CORONARY ANGIOGRAPHY Bilateral 05/19/2023   Procedure: RIGHT/LEFT HEART CATH AND CORONARY ANGIOGRAPHY;  Surgeon: Anner Alm ORN, MD;  Location: [facility];  Service: Cardiovascular;  Laterality: Bilateral;   TOTAL HIP ARTHROPLASTY Right 11/23/2011   TOTAL HIP ARTHROPLASTY Left 09/07/2011   TOTAL KNEE ARTHROPLASTY Right 02/15/2018   VASECTOMY  1978   Family History  Problem Relation Age of Onset   Anxiety disorder Sister    Social History   Socioeconomic History   Marital status: Married    Spouse name: Not on file   Number of children: Not on file   Years of education: Not on file   Highest education level: Not on file  Occupational History   Occupation: retired  Tobacco Use   Smoking status: Never   Smokeless tobacco: Never  Vaping Use   Vaping status: Never Used  Substance and Sexual Activity   Alcohol use: Not Currently    Comment: past   Drug use: Never   Sexual activity: Not on file  Other Topics Concern   Not on file  Social History Narrative   Not on file   Social Drivers of Health   Financial Resource Strain: Low Risk  (11/04/2023)   Overall Financial Resource Strain (CARDIA)    Difficulty of Paying Living Expenses: Not hard at all  Food Insecurity:  No Food Insecurity (11/04/2023)   Hunger Vital Sign    Worried About Running Out of Food in the Last Year: Never true    Ran Out of Food in the Last Year: Never true  Transportation Needs: No Transportation Needs (11/04/2023)   PRAPARE - Administrator, Civil Service (Medical): No    Lack of Transportation (Non-Medical): No  Physical Activity: Insufficiently Active (11/04/2023)   Exercise Vital Sign    Days of Exercise per Week: 3 days    Minutes of Exercise per Session: 30 min  Stress: No Stress Concern Present (11/04/2023)   [employer] of Occupational Health - Occupational Stress Questionnaire    Feeling of Stress: Not at all  Social Connections: Moderately Integrated (11/04/2023)   Social Connection and Isolation Panel    Frequency of Communication with Friends and Family: More than three times a week    Frequency of Social Gatherings with Friends and Family: Three times a week    Attends Religious Services: More than 4 times per year    Active Member of Clubs or Organizations: Patient declined    Attends Banker Meetings: Never    Marital Status: Married    Tobacco Counseling Counseling given: Not Answered    Clinical Intake:  Pre-visit preparation completed: Yes  Pain : No/denies pain     BMI - recorded: 34.4 Nutritional Status: BMI > 30  Obese Nutritional Risks: None Diabetes: No  Lab Results  Component Value Date   HGBA1C 6.1 (H) 12/15/2022   HGBA1C 6.2 (H) 12/22/2021   HGBA1C 6.1 (A) 07/03/2021     How often do you need to have someone help you when you read instructions, pamphlets, or other written materials from your doctor or pharmacy?: 1 - Never  Interpreter Needed?: No  Information entered by :: JHONNIE DAS, LPN   Activities of Daily Living    11/04/2023    4:11 PM 05/19/2023    7:54 PM  In your present state of health, do you have any difficulty performing the following activities:  Hearing? 1 1  Vision? 0 0   Difficulty concentrating or making decisions? 1 0  Walking or climbing stairs? 1   Dressing or bathing? 0   Doing errands, shopping? 0   Preparing Food and eating ? N  Using the Toilet? N   In the past six months, have you accidently leaked urine? N   Do you have problems with loss of bowel control? N   Managing your Medications? N   Managing your Finances? N   Housekeeping or managing your Housekeeping? N     Patient Care Team: Edman Marsa PARAS, DO as PCP - General (Family Medicine) Perla Evalene PARAS, MD as PCP - Cardiology (Cardiology) Alana Sharyle LABOR, RPH-CPP as Pharmacist Karoline Lima, RN as [facility] Myrna Adine Anes, MD as Consulting Physician (Ophthalmology) Neill Boas, DPM (Podiatry) Franchester Mikey DEL, PA-C as Physician Assistant (Cardiology) Twylla Glendia BROCKS, MD (Urology)  I have updated your Care Teams any recent Medical Services you may have received from other providers in the past year.     Assessment:   This is a routine wellness examination for [place].  Hearing/Vision screen Hearing Screening - Comments:: WEARS AIDS, BOTH EARS Vision Screening - Comments:: READERS- DR[facility]UE IN [facility] [place]   Goals Addressed             This Visit's Progress    Cut out extra servings         Depression Screen     11/04/2023    4:08 PM 08/23/2023    3:44 PM 07/11/2023    2:44 PM 05/10/2023    3:00 PM 04/19/2023    4:50 PM 03/25/2023    2:08 PM 12/21/2022    5:34 PM  PHQ 2/9 Scores  PHQ - 2 Score 2 0 1 0 1 2 0  PHQ- 9 Score 3  7   5  0    Fall Risk     11/04/2023    4:10 PM 07/11/2023    2:44 PM 05/10/2023    3:00 PM 04/19/2023    4:50 PM 03/25/2023    2:07 PM  Fall Risk   Falls in the past year? 1 1 1 1 1   Number falls in past yr: 1 0 0 0 0  Injury with Fall? 0 0 0 0 1  Risk for fall due to : History of fall(s)  Medication side effect;Impaired balance/gait    Follow up Falls evaluation completed;Falls prevention discussed  Falls  prevention discussed      MEDICARE RISK AT HOME:  Medicare Risk at Home Any stairs in or around the home?: Yes If so, are there any without handrails?: No Home free of loose throw rugs in walkways, pet beds, electrical cords, etc?: Yes Adequate lighting in your home to reduce risk of falls?: Yes Life alert?: No Use of a cane, walker or w/c?: No (CANE IF FEELS BAD) Grab bars in the bathroom?: Yes Shower chair or bench in shower?: Yes Elevated toilet seat or a handicapped toilet?: Yes  TIMED UP AND GO:  Was the test performed?  No  Cognitive Function: 6CIT completed        11/04/2023    4:15 PM 10/22/2022    3:15 PM 09/25/2021    2:53 PM 07/29/2020    2:06 PM 07/17/2019   11:14 AM  6CIT Screen  What Year?  0 points 0 points 0 points 0 points  What month?  0 points 0 points 0 points 0 points  What time? 0 points 0 points 0 points 0 points 0 points  Count back from 20 0 points 0 points 0 points 0 points 0 points  Months in reverse 0 points 0 points 0 points 0 points 0 points  Repeat phrase 0  points 0 points 2 points 4 points 4 points  Total Score  0 points 2 points 4 points 4 points    Immunizations Immunization History  Administered Date(s) Administered   Fluad Quad(high Dose 65+) 10/16/2020, 09/25/2021   Hepatitis A, Adult 09/23/1996, 03/16/1997   INFLUENZA, HIGH DOSE SEASONAL PF 09/05/2018, 09/28/2023   Influenza Split 10/11/2016   Influenza Whole 10/27/1996, 11/02/1997, 12/24/1998, 10/31/1999, 11/13/2000   Influenza-Unspecified 09/25/2019, 10/15/2022   Moderna Covid-19 Vaccine Bivalent Booster 42yrs & up 10/20/2020   Moderna Sars-Covid-2 Vaccination 02/23/2019, 03/24/2019, 11/14/2019, 06/03/2020   OPV 02/11/1985   PNEUMOCOCCAL CONJUGATE-20 12/21/2022   PPD Test 10/31/1999, 11/13/2000   Pneumococcal Conjugate-13 11/08/2014   Pneumococcal Polysaccharide-23 11/23/2000, 03/01/2008   Td 09/12/1994, 03/01/2008   Td (Adult), 2 Lf Tetanus Toxid, Preservative Free  09/12/1994   Tdap 10/08/2020   Typhoid Parenteral 09/23/1996   Typhoid Parenteral, AKD ([place]  [employer]) 11/11/1993   Yellow Fever 01/12/1995   Zoster Recombinant(Shingrix ) 06/25/2009, 07/24/2020    Screening Tests Health Maintenance  Topic Date Due   Medicare Annual Wellness (AWV)  11/03/2024   DTaP/Tdap/Td (5 - Td or Tdap) 10/09/2030   Pneumococcal Vaccine: 50+ Years  Completed   Influenza Vaccine  Completed   Zoster Vaccines- Shingrix   Completed   Meningococcal B Vaccine  Aged Out   COVID-19 Vaccine  Discontinued    Health Maintenance Items Addressed: UTD ON SHOTS; AGED OUT OF COLONOSCOPY  Additional Screening:  Vision Screening: Recommended annual ophthalmology exams for early detection of glaucoma and other disorders of the eye. Is the patient up to date with their annual eye exam?  Yes  Who is the provider or what is the name of the office in which the patient attends annual eye exams? [facility] [place]  Dental Screening: Recommended annual dental exams for proper oral hygiene  Community Resource Referral / Chronic Care Management: CRR required this visit?  No   CCM required this visit?  No   Plan:    I have personally reviewed and noted the following in the patient's chart:   Medical and social history Use of alcohol, tobacco or illicit drugs  Current medications and supplements including opioid prescriptions. Patient is not currently taking opioid prescriptions. Functional ability and status Nutritional status Physical activity Advanced directives List of other physicians Hospitalizations, surgeries, and ER visits in previous 12 months Vitals Screenings to include cognitive, depression, and falls Referrals and appointments  In addition, I have reviewed and discussed with patient certain preventive protocols, quality metrics, and best practice recommendations. A written personalized care plan for preventive services as well as general preventive health  recommendations were provided to patient.   Jhonnie GORMAN Das, LPN   [id number]   After Visit Summary: (MyChart) Due to this being a telephonic visit, the after visit summary with patients personalized plan was offered to patient via MyChart   Notes: Nothing significant to report at this time.

## 2023-11-04 NOTE — Patient Instructions (Addendum)
 Mr. Luke Mckee,  Thank you for taking the time for your Medicare Wellness Visit. I appreciate your continued commitment to your health goals. Please review the care plan we discussed, and feel free to reach out if I can assist you further.  Medicare recommends these wellness visits once per year to help you and your care team stay ahead of potential health issues. These visits are designed to focus on prevention, allowing your provider to concentrate on managing your acute and chronic conditions during your regular appointments.  Please note that Annual Wellness Visits do not include a physical exam. Some assessments may be limited, especially if the visit was conducted virtually. If needed, we may recommend a separate in-person follow-up with your provider.  Ongoing Care Seeing your primary care provider every 3 to 6 months helps us  monitor your health and provide consistent, personalized care.   Referrals If a referral was made during today's visit and you haven't received any updates within two weeks, please contact the referred provider directly to check on the status.  Recommended Screenings:  Health Maintenance  Topic Date Due   Medicare Annual Wellness Visit  11/03/2024   DTaP/Tdap/Td vaccine (5 - Td or Tdap) 10/09/2030   Pneumococcal Vaccine for age over 62  Completed   Flu Shot  Completed   Zoster (Shingles) Vaccine  Completed   Meningitis B Vaccine  Aged Out   COVID-19 Vaccine  Discontinued    Advance Care Planning is important because it: Ensures you receive medical care that aligns with your values, goals, and preferences. Provides guidance to your family and loved ones, reducing the emotional burden of decision-making during critical moments.  Vision: Annual vision screenings are recommended for early detection of glaucoma, cataracts, and diabetic retinopathy. These exams can also reveal signs of chronic conditions such as diabetes and high blood pressure.  Dental: Annual  dental screenings help detect early signs of oral cancer, gum disease, and other conditions linked to overall health, including heart disease and diabetes.  Please see the attached documents for additional preventive care recommendations.   NEXT AWV 11/09/24 @ 2:40 PM IN PERSON

## 2023-11-07 ENCOUNTER — Encounter: Payer: Self-pay | Admitting: Student in an Organized Health Care Education/Training Program

## 2023-11-07 ENCOUNTER — Ambulatory Visit: Admitting: Student in an Organized Health Care Education/Training Program

## 2023-11-07 VITALS — BP 120/60 | HR 64 | Temp 97.5°F | Ht 68.0 in | Wt 228.0 lb

## 2023-11-07 DIAGNOSIS — J849 Interstitial pulmonary disease, unspecified: Secondary | ICD-10-CM

## 2023-11-07 DIAGNOSIS — G4733 Obstructive sleep apnea (adult) (pediatric): Secondary | ICD-10-CM

## 2023-11-07 DIAGNOSIS — R0602 Shortness of breath: Secondary | ICD-10-CM | POA: Diagnosis not present

## 2023-11-07 DIAGNOSIS — Z9861 Coronary angioplasty status: Secondary | ICD-10-CM | POA: Diagnosis not present

## 2023-11-07 DIAGNOSIS — I2510 Atherosclerotic heart disease of native coronary artery without angina pectoris: Secondary | ICD-10-CM

## 2023-11-07 DIAGNOSIS — I5032 Chronic diastolic (congestive) heart failure: Secondary | ICD-10-CM | POA: Diagnosis not present

## 2023-11-07 NOTE — Patient Instructions (Signed)
 Thank you for allowing the Complex Care Management team to participate in your care. It was great speaking with you!  We will follow up on  November 21, 2023 at 1100. Please do not hesitate to contact me if you require assistance prior to our next outreach.   Jackson Acron [facility] Population Health RN Care Manager Direct Dial: [phone]  Fax: [phone] Website: [email or web address]

## 2023-11-07 NOTE — Progress Notes (Signed)
 Assessment & Plan:   #ILD (interstitial lung disease) (HCC) (Primary) #OSA on CPAP #Shortness of breath #Chronic HFpEF #CAD S/P percutaneous coronary angioplasty  Mild pulmonary fibrosis confirmed on CT scan with scarring at lung bases and traction bronchiectasis noted. Differential diagnosis includes pulmonary fibrosis with autoimmune features, hypersensitivity pneumonitis, pneumoconiosis, asbestos exposure, reflux disease, and idiopathic pulmonary fibrosis. No smoking history reported, but he does have a history of exposures (hay, multiple exposures in the [employer]).  Will initiate workup with blood work for autoimmune causes and a swallowing study for acid reflux. Will monitor lung function with repeat spirometry in six months. Will consider antifibrotic therapy if lung function deteriorates, currently deferred due to potential gastrointestinal side effects such as nausea and diarrhea.  Furthermore, PFT's from prior showed an obstructive pattern, with improvement following bronchodilator challenge. He was started on ICS/LABA with improvement in symptoms.   - Aldolase - ANA 12 Plus Profile (RDL) - ANCA Profile - Anti-CCP Ab, IgG + IgA (RDL) - CK - Hypersensitivity Pneumonitis - MyoMarker 3 Plus Profile (RDL) - Rheumatoid factor - DG ESOPHAGUS W DOUBLE CM (HD); Future - Pulmonary Function Test; Future    Return in about 3 months (around 02/07/2024).  Belva November, MD [facility]  I spent 30 minutes caring for this patient today, including preparing to see the patient, obtaining a medical history , reviewing a separately obtained history, performing a medically appropriate examination and/or evaluation, counseling and educating the patient/family/caregiver, ordering medications, tests, or procedures, documenting clinical information in the electronic health record, and independently interpreting results (not separately reported/billed) and communicating results  to the patient/family/caregiver  End of visit medications:  No orders of the defined types were placed in this encounter.    Current Outpatient Medications:    acetaminophen  (TYLENOL ) 650 MG CR tablet, Take 1,300 mg by mouth at bedtime as needed for pain., Disp: , Rfl:    albuterol  (VENTOLIN  HFA) 108 (90 Base) MCG/ACT inhaler, Inhale 1-2 puffs into the lungs every 4 (four) hours as needed for wheezing or shortness of breath (cough)., Disp: 1 each, Rfl: 3   aspirin  EC 81 MG tablet, Take 81 mg by mouth daily., Disp: , Rfl:    atenolol  (TENORMIN ) 25 MG tablet, Take 1 tablet (25 mg total) by mouth every morning., Disp: 90 tablet, Rfl: 1   atorvastatin  (LIPITOR ) 80 MG tablet, Take 1 tablet (80 mg total) by mouth at bedtime., Disp: 90 tablet, Rfl: 1   Calcium  Carb-Cholecalciferol (CALCIUM  600 + D PO), Take 1 tablet by mouth daily., Disp: , Rfl:    Cholecalciferol (VITAMIN D) 50 MCG (2000 UT) tablet, Take 2,000 Units by mouth daily., Disp: , Rfl:    clopidogrel  (PLAVIX ) 75 MG tablet, Take 1 tablet (75 mg total) by mouth daily., Disp: 90 tablet, Rfl: 3   clotrimazole -betamethasone  (LOTRISONE ) cream, Apply twice a day for 1-2 weeks, may repeat if need in future, Disp: 30 g, Rfl: 1   Coenzyme Q10 100 MG TABS, Take 100 mg by mouth daily., Disp: , Rfl:    colchicine  0.6 MG tablet, For acute gout flare take 2 pills at once, then take a 3rd pill 2 hours later. Then take 1 pill daily for 7-10 days or until resolved., Disp: 30 tablet, Rfl: 2   diclofenac sodium (VOLTAREN) 1 % GEL, Apply 2 g topically 4 (four) times daily as needed for pain. Foot pain, Disp: , Rfl:    escitalopram  (LEXAPRO ) 10 MG tablet, Take 1 tablet (10 mg total)  by mouth daily., Disp: 90 tablet, Rfl: 1   fluticasone -salmeterol (WIXELA INHUB) 100-50 MCG/ACT AEPB, Inhale 1 puff into the lungs 2 (two) times daily., Disp: 60 each, Rfl: 12   furosemide  (LASIX ) 80 MG tablet, Take 1 tablet (80 mg total) by mouth daily. May take 2nd dose AS NEEDED  for fluid retention., Disp: 90 tablet, Rfl: 1   ipratropium (ATROVENT ) 0.06 % nasal spray, Place 2 sprays into both nostrils 4 (four) times daily as needed for rhinitis., Disp: 15 mL, Rfl: 2   levothyroxine  (SYNTHROID ) 125 MCG tablet, Take 1 tablet (125 mcg total) by mouth daily before breakfast., Disp: 90 tablet, Rfl: 1   loratadine  (CLARITIN ) 10 MG tablet, Take 10 mg by mouth daily., Disp: , Rfl:    meclizine  (ANTIVERT ) 12.5 MG tablet, Take 1 tablet (12.5 mg total) by mouth 3 (three) times daily as needed for dizziness., Disp: 30 tablet, Rfl: 1   montelukast  (SINGULAIR ) 10 MG tablet, Take 1 tablet (10 mg total) by mouth at bedtime., Disp: 90 tablet, Rfl: 1   Multiple Vitamin (MULTIVITAMIN) tablet, Take 1 tablet by mouth daily., Disp: , Rfl:    nitroGLYCERIN  (NITROSTAT ) 0.4 MG SL tablet, Place 1 tablet (0.4 mg total) under the tongue every 5 (five) minutes as needed for chest pain., Disp: 20 tablet, Rfl: 2   PATADAY 0.1 % ophthalmic solution, Place 1 drop into both eyes daily as needed for allergies., Disp: , Rfl:    sennosides-docusate sodium  (SENOKOT-S) 8.6-50 MG tablet, Take 2 tablets by mouth daily., Disp: , Rfl:    traZODone  (DESYREL ) 50 MG tablet, Take 1 tablet (50 mg total) by mouth at bedtime., Disp: 90 tablet, Rfl: 1   Subjective:   PATIENT ID: Luke Mckee GENDER: male DOB: 05-04-1940, MRN: [id number]  Chief Complaint  Patient presents with   Shortness of Breath    DOE. Cough with green sputum in the morning. No wheezing.  Wixela- BID helps with his breathing. Albuterol - PRN    HPI  Luke Mckee is an 83 year old male with mild pulmonary fibrosis who presents for follow-up.   Initial Visit 06/20/2023:   Patient is referred to us  by cardiology given symptoms of shortness of breath and exertional dyspnea.  He denies having any cough or wheezing.  He does not have any chest pain or chest tightness, nor does he report any fevers, chills, night sweats, or symptoms of systemic  illness.  Patient does endorse some symptoms of seasonal allergies.  He has an albuterol  inhaler but he has not been using it much.   Patient follows closely with his primary care physician where he is reported to have a history of CVA with residual right-sided deficit (left MCA territory), hypertension, and CAD status post CABG (x 3) as well as stent placement.  He has been followed by cardiology where an echocardiogram from January 2025 showed normal ejection fraction with mild, grade 1, diastolic dysfunction.  His RV function was noted to be normal as were his valves.  Given persistent symptoms, cardiac PET was ordered which was mildly abnormal with mild ischemia in the inferior wall.  He underwent both right and left heart caths on 05/19/2023 showing multivessel disease.  Patient had balloon angioplasty to in-stent restenosis within the vein graft to OM2 with improvement.  His right heart cath showed a wedge pressure of 12 with an associated LVEDP of 20.   Return Visit 07/27/2023:   He reports improvement in his shortness of breath since his cardiac cath (  with balloon angioplasty, noted to have LVEDP of 20), but this persists. He's had his PFT's which showed some bronchial reactivity as well as some findings suggestive of restriction. He continues to be compliant with his CPAP machine.  Return Visit 11/07/2023:  Discussed the use of AI scribe software for clinical note transcription with the patient, who gave verbal consent to proceed.  He has been using his inhaler regularly, taking it upon waking and before bed, which allows him to rinse his mouth afterward. His breathing has improved, with an estimated five to six out of ten improvement. His oxygen levels remain at 100% with CPAP use at night but drop to 95% if he removes it due to disturbances, such as his dog getting up.  No recent respiratory illnesses, worsening cough, infections, colds, or pneumonias since July. He experiences a runny nose  upon waking, which clears as the day progresses, and notes that wearing his CPAP mask helps dry his nose.  CT scan showed mild pulmonary fibrosis with scarring at the bases of both lungs and he is presenting today to discuss results. A prior breathing test showed reduced lung volumes and diffusion capacity. He has a history of exposure to hay in his youth and possible asbestos exposure during his [employer] service in the [employer].   Patient denies any smoking history.  He previously served in [employer] for 6 years (ages [age]) then worked as a copywriter, advertising for [employer].  He then served in [employer] from ages 70 until 45 before retiring.  During his [employer], he was exposed to jet fuels and fumes. He does report potential exposure to asbestos during his service with the [employer].  He also reports some significant secondhand smoke exposure.  Ancillary information including prior medications, full medical/surgical/family/social histories, and PFTs (when available) are listed below and have been reviewed.    Review of Systems  Constitutional:  Negative for chills, fever and malaise/fatigue.  Respiratory:  Positive for shortness of breath. Negative for cough, hemoptysis, sputum production and wheezing.   Cardiovascular:  Negative for chest pain.     Objective:   Vitals:   11/07/23 1120  BP: 120/60  Pulse: 64  Temp: (!) 97.5 F (36.4 C)  SpO2: 95%  Weight: 228 lb (103.4 kg)  Height: 5' 8 (1.727 m)   95% on RA  BMI Readings from Last 3 Encounters:  11/07/23 34.67 kg/m  08/29/23 34.38 kg/m  08/23/23 34.06 kg/m   Wt Readings from Last 3 Encounters:  11/07/23 228 lb (103.4 kg)  08/29/23 226 lb 2 oz (102.6 kg)  08/23/23 224 lb (101.6 kg)    Physical Exam Constitutional:      Appearance: He is well-developed.  Cardiovascular:     Rate and Rhythm: Normal rate and regular rhythm.     Pulses: Normal pulses.     Heart sounds: Normal heart sounds.   Pulmonary:     Effort: Pulmonary effort is normal.     Breath sounds: Rales present.  Abdominal:     General: There is distension.     Palpations: Abdomen is soft.  Neurological:     General: No focal deficit present.     Mental Status: He is alert and oriented to person, place, and time. Mental status is at baseline.       Ancillary Information    Past Medical History:  Diagnosis Date   Anxiety    Arthritis    CAD (coronary artery disease) 2005  s/p CABG, DES   CHF (congestive heart failure) (HCC)    in EPIC care everywhere   Chronic kidney disease    COPD (chronic obstructive pulmonary disease) (HCC)    in EPIC care everywhere   Depression    Heart murmur    History of stroke    Hypothyroidism    OSA (obstructive sleep apnea)    Paroxysmal A-fib (HCC)    in EPIC careeverywhere     Family History  Problem Relation Age of Onset   Anxiety disorder Sister      Past Surgical History:  Procedure Laterality Date   APPENDECTOMY     BUNIONECTOMY     CATARACT EXTRACTION     Right eye   CATARACT EXTRACTION W/PHACO Left 01/19/2021   Procedure: CATARACT EXTRACTION PHACO AND INTRAOCULAR LENS PLACEMENT (IOC) LEFT 3.09 00:28.3;  Surgeon: Myrna Adine Anes, MD;  Location: [facility];  Service: Ophthalmology;  Laterality: Left;   COLONOSCOPY  07/06/2004   CORONARY ARTERY BYPASS GRAFT  2005   CORONARY BALLOON ANGIOPLASTY N/A 05/19/2023   Procedure: CORONARY BALLOON ANGIOPLASTY;  Surgeon: Anner Alm ORN, MD;  Location: [facility];  Service: Cardiovascular;  Laterality: N/A;   LOOP RECORDER INSERTION N/A 07/25/2018   Procedure: LOOP RECORDER INSERTION;  Surgeon: Inocencio Soyla Lunger, MD;  Location: [facility];  Service: Cardiovascular;  Laterality: N/A;   RIGHT/LEFT HEART CATH AND CORONARY ANGIOGRAPHY Bilateral 05/19/2023   Procedure: RIGHT/LEFT HEART CATH AND CORONARY ANGIOGRAPHY;  Surgeon: Anner Alm ORN, MD;  Location: [facility];   Service: Cardiovascular;  Laterality: Bilateral;   TOTAL HIP ARTHROPLASTY Right 11/23/2011   TOTAL HIP ARTHROPLASTY Left 09/07/2011   TOTAL KNEE ARTHROPLASTY Right 02/15/2018   VASECTOMY  1978    Social History   Socioeconomic History   Marital status: Married    Spouse name: Not on file   Number of children: Not on file   Years of education: Not on file   Highest education level: Not on file  Occupational History   Occupation: retired  Tobacco Use   Smoking status: Never   Smokeless tobacco: Never  Vaping Use   Vaping status: Never Used  Substance and Sexual Activity   Alcohol use: Not Currently    Comment: past   Drug use: Never   Sexual activity: Not on file  Other Topics Concern   Not on file  Social History Narrative   Not on file   Social Drivers of Health   Financial Resource Strain: Low Risk  (11/04/2023)   Overall Financial Resource Strain (CARDIA)    Difficulty of Paying Living Expenses: Not hard at all  Food Insecurity: No Food Insecurity (11/04/2023)   Hunger Vital Sign    Worried About Running Out of Food in the Last Year: Never true    Ran Out of Food in the Last Year: Never true  Transportation Needs: No Transportation Needs (11/04/2023)   PRAPARE - Administrator, Civil Service (Medical): No    Lack of Transportation (Non-Medical): No  Physical Activity: Insufficiently Active (11/04/2023)   Exercise Vital Sign    Days of Exercise per Week: 3 days    Minutes of Exercise per Session: 30 min  Stress: No Stress Concern Present (11/04/2023)   [employer] of Occupational Health - Occupational Stress Questionnaire    Feeling of Stress: Not at all  Social Connections: Moderately Integrated (11/04/2023)   Social Connection and Isolation Panel    Frequency of  Communication with Friends and Family: More than three times a week    Frequency of Social Gatherings with Friends and Family: Three times a week    Attends Religious Services: More  than 4 times per year    Active Member of Clubs or Organizations: Patient declined    Attends Banker Meetings: Never    Marital Status: Married  Catering Manager Violence: Not At Risk (11/04/2023)   Humiliation, Afraid, Rape, and Kick questionnaire    Fear of Current or Ex-Partner: No    Emotionally Abused: No    Physically Abused: No    Sexually Abused: No     Allergies  Allergen Reactions   Bee Pollen Cough   Pollen Extract Cough   Ace Inhibitors    Cephalexin     Vomiting and diarrhea   Crestor [Rosuvastatin Calcium ] Other (See Comments)    myalgia   Tape Rash    blistering     CBC    Component Value Date/Time   WBC 5.8 06/28/2023 1027   RBC 3.89 (L) 06/28/2023 1027   HGB 11.9 (L) 06/28/2023 1027   HGB 13.5 05/17/2023 1203   HCT 38.4 (L) 06/28/2023 1027   HCT 41.6 05/17/2023 1203   PLT 228 06/28/2023 1027   PLT 287 05/17/2023 1203   MCV 98.7 06/28/2023 1027   MCV 97 05/17/2023 1203   MCH 30.6 06/28/2023 1027   MCHC 31.0 (L) 06/28/2023 1027   RDW 13.1 06/28/2023 1027   RDW 13.1 05/17/2023 1203   LYMPHSABS 1.7 07/29/2022 1408   MONOABS 0.8 07/29/2022 1408   EOSABS 319 06/28/2023 1027   BASOSABS 81 06/28/2023 1027    Pulmonary Functions Testing Results:    Latest Ref Rng & Units 07/27/2023   10:51 AM  PFT Results  FVC-Pre L 2.62   FVC-Predicted Pre % 72   FVC-Post L 2.82   FVC-Predicted Post % 78   Pre FEV1/FVC % % 84   Post FEV1/FCV % % 87   FEV1-Pre L 2.19   FEV1-Predicted Pre % 86   FEV1-Post L 2.44   DLCO uncorrected ml/min/mmHg 14.89   DLCO UNC% % 66   DLVA Predicted % 91   TLC L 4.96   TLC % Predicted % 74   RV % Predicted % 89     Outpatient Medications Prior to Visit  Medication Sig Dispense Refill   acetaminophen  (TYLENOL ) 650 MG CR tablet Take 1,300 mg by mouth at bedtime as needed for pain.     albuterol  (VENTOLIN  HFA) 108 (90 Base) MCG/ACT inhaler Inhale 1-2 puffs into the lungs every 4 (four) hours as needed for  wheezing or shortness of breath (cough). 1 each 3   aspirin  EC 81 MG tablet Take 81 mg by mouth daily.     atenolol  (TENORMIN ) 25 MG tablet Take 1 tablet (25 mg total) by mouth every morning. 90 tablet 1   atorvastatin  (LIPITOR ) 80 MG tablet Take 1 tablet (80 mg total) by mouth at bedtime. 90 tablet 1   Calcium  Carb-Cholecalciferol (CALCIUM  600 + D PO) Take 1 tablet by mouth daily.     Cholecalciferol (VITAMIN D) 50 MCG (2000 UT) tablet Take 2,000 Units by mouth daily.     clopidogrel  (PLAVIX ) 75 MG tablet Take 1 tablet (75 mg total) by mouth daily. 90 tablet 3   clotrimazole -betamethasone  (LOTRISONE ) cream Apply twice a day for 1-2 weeks, may repeat if need in future 30 g 1   Coenzyme Q10 100 MG TABS  Take 100 mg by mouth daily.     colchicine  0.6 MG tablet For acute gout flare take 2 pills at once, then take a 3rd pill 2 hours later. Then take 1 pill daily for 7-10 days or until resolved. 30 tablet 2   diclofenac sodium (VOLTAREN) 1 % GEL Apply 2 g topically 4 (four) times daily as needed for pain. Foot pain     escitalopram  (LEXAPRO ) 10 MG tablet Take 1 tablet (10 mg total) by mouth daily. 90 tablet 1   fluticasone -salmeterol (WIXELA INHUB) 100-50 MCG/ACT AEPB Inhale 1 puff into the lungs 2 (two) times daily. 60 each 12   furosemide  (LASIX ) 80 MG tablet Take 1 tablet (80 mg total) by mouth daily. May take 2nd dose AS NEEDED for fluid retention. 90 tablet 1   ipratropium (ATROVENT ) 0.06 % nasal spray Place 2 sprays into both nostrils 4 (four) times daily as needed for rhinitis. 15 mL 2   levothyroxine  (SYNTHROID ) 125 MCG tablet Take 1 tablet (125 mcg total) by mouth daily before breakfast. 90 tablet 1   loratadine  (CLARITIN ) 10 MG tablet Take 10 mg by mouth daily.     meclizine  (ANTIVERT ) 12.5 MG tablet Take 1 tablet (12.5 mg total) by mouth 3 (three) times daily as needed for dizziness. 30 tablet 1   montelukast  (SINGULAIR ) 10 MG tablet Take 1 tablet (10 mg total) by mouth at bedtime. 90 tablet 1    Multiple Vitamin (MULTIVITAMIN) tablet Take 1 tablet by mouth daily.     nitroGLYCERIN  (NITROSTAT ) 0.4 MG SL tablet Place 1 tablet (0.4 mg total) under the tongue every 5 (five) minutes as needed for chest pain. 20 tablet 2   PATADAY 0.1 % ophthalmic solution Place 1 drop into both eyes daily as needed for allergies.     sennosides-docusate sodium  (SENOKOT-S) 8.6-50 MG tablet Take 2 tablets by mouth daily.     traZODone  (DESYREL ) 50 MG tablet Take 1 tablet (50 mg total) by mouth at bedtime. 90 tablet 1   No facility-administered medications prior to visit.

## 2023-11-07 NOTE — Patient Instructions (Addendum)
  VISIT SUMMARY: Today, you came in for a follow-up visit regarding your mild pulmonary fibrosis. We discussed your current symptoms, reviewed your recent CT scan and breathing test results, and talked about your treatment plan moving forward.  YOUR PLAN: -PULMONARY FIBROSIS: Pulmonary fibrosis is a condition where the lung tissue becomes scarred and stiff, making it difficult to breathe. Your CT scan confirmed mild scarring at the bases of your lungs. We will conduct blood work to check for autoimmune causes and a swallowing study to investigate acid reflux. We will monitor your lung function with another breathing test in six months. If your lung function worsens, we may consider antifibrotic therapy, although it is currently deferred due to potential side effects.  -OBSTRUCTIVE AIRWAY DISEASE: Obstructive airway disease is a condition where the airways are narrowed, making it hard to breathe. Your condition has shown improvement with the use of a long-acting inhaler. Your breathing test indicates that your airways respond well to albuterol , and there have been no recent respiratory infections.  -OBSTRUCTIVE SLEEP APNEA: Obstructive sleep apnea is a condition where your breathing stops and starts during sleep. Your oxygen levels drop to 95% if you remove your CPAP mask at night. It is important to continue using your CPAP mask to maintain your oxygen levels.  INSTRUCTIONS: Please complete the blood work for autoimmune causes and the swallowing study for acid reflux as discussed. We will repeat the spirometry test in six months to monitor your lung function. Continue using your inhaler as prescribed and keep using your CPAP mask at night. Follow up with us  if you experience any new or worsening symptoms.   Today, I ordered blood work. You can get them draw at your preferred [facility] draw station. The nearest one to [facility] is at nearby [facility] ([address] [place], [place], [place] [address]).

## 2023-11-09 ENCOUNTER — Ambulatory Visit
Admission: RE | Admit: 2023-11-09 | Discharge: 2023-11-09 | Disposition: A | Discharge status: Home / Self Care | Source: Ambulatory Visit | Attending: Student in an Organized Health Care Education/Training Program | Admitting: Student in an Organized Health Care Education/Training Program

## 2023-11-09 DIAGNOSIS — J849 Interstitial pulmonary disease, unspecified: Secondary | ICD-10-CM | POA: Insufficient documentation

## 2023-11-09 DIAGNOSIS — K224 Dyskinesia of esophagus: Secondary | ICD-10-CM | POA: Diagnosis not present

## 2023-11-09 DIAGNOSIS — K219 Gastro-esophageal reflux disease without esophagitis: Secondary | ICD-10-CM | POA: Diagnosis not present

## 2023-11-09 DIAGNOSIS — R053 Chronic cough: Secondary | ICD-10-CM | POA: Diagnosis not present

## 2023-11-10 ENCOUNTER — Other Ambulatory Visit: Payer: Self-pay | Admitting: Student in an Organized Health Care Education/Training Program

## 2023-11-10 DIAGNOSIS — H1132 Conjunctival hemorrhage, left eye: Secondary | ICD-10-CM | POA: Diagnosis not present

## 2023-11-10 DIAGNOSIS — Z961 Presence of intraocular lens: Secondary | ICD-10-CM | POA: Diagnosis not present

## 2023-11-21 ENCOUNTER — Other Ambulatory Visit: Payer: Self-pay

## 2023-11-21 NOTE — Patient Outreach (Unsigned)
 Complex Care Management   Visit Note  11/21/2023  Name:  Luke Mckee MRN: [id number] DOB: Aug 11, 1940  Situation: Referral received for Complex Care Management related to {Criteria:32550} I obtained verbal consent from {CHL AMB Patient/Caregiver:28184}.  Visit completed with {CHL AMB Patient/Caregiver:28184}  {VISIT LOCATION:32553}  Background:   Past Medical History:  Diagnosis Date   Anxiety    Arthritis    CAD (coronary artery disease) 2005   s/p CABG, DES   CHF (congestive heart failure) (HCC)    in EPIC care everywhere   Chronic kidney disease    COPD (chronic obstructive pulmonary disease) (HCC)    in EPIC care everywhere   Depression    Heart murmur    History of stroke    Hypothyroidism    OSA (obstructive sleep apnea)    Paroxysmal A-fib (HCC)    in EPIC careeverywhere    Assessment: Patient Reported Symptoms:  Cognitive        Neurological      HEENT        Cardiovascular      Respiratory      Endocrine      Gastrointestinal        Genitourinary      Integumentary      Musculoskeletal          Psychosocial            11/21/2023    PHQ2-9 Depression Screening   Little interest or pleasure in doing things    Feeling down, depressed, or hopeless    PHQ-2 - Total Score    Trouble falling or staying asleep, or sleeping too much    Feeling tired or having little energy    Poor appetite or overeating     Feeling bad about yourself - or that you are a failure or have let yourself or your family down    Trouble concentrating on things, such as reading the newspaper or watching television    Moving or speaking so slowly that other people could have noticed.  Or the opposite - being so fidgety or restless that you have been moving around a lot more than usual    Thoughts that you would be better off dead, or hurting yourself in some way    PHQ2-9 Total Score    If you checked off any problems, how difficult have these problems made it for you  to do your work, take care of things at home, or get along with other people    Depression Interventions/Treatment      There were no vitals filed for this visit.  Medications Reviewed Today   Medications were not reviewed in this encounter     Recommendation:   {RECOMMENDATONS:32554}  Follow Up Plan:   {FOLLOWUP:32559}  SIG ***

## 2023-11-22 NOTE — Patient Instructions (Addendum)
 Thank you for allowing the Complex Care Management team to participate in your care. It was great speaking with you!  We will follow up on December 12, 2023 at 1 pm. Please do not hesitate to contact me if you require assistance prior to our next outreach.   Jackson Acron [facility] Population Health RN Care Manager Direct Dial: [phone]  Fax: [phone] Website: [email or web address]

## 2023-12-01 ENCOUNTER — Ambulatory Visit: Payer: Self-pay | Admitting: Student in an Organized Health Care Education/Training Program

## 2023-12-01 LAB — ANA 12 PLUS PROFILE, POSITIVE
Anti-Cardiolipin Ab, IgA (RDL): <12 U/mL (ref <12)
Anti-Cardiolipin Ab, IgG (RDL): <15 GPL U/mL (ref <15)
Anti-Cardiolipin Ab, IgM (RDL): 15 [MPL'U]/mL (ref <13)
Anti-Centromere Ab (RDL): <1:40 {titer}
Anti-Chromatin Ab, IgG (RDL): <20 U (ref <20)
Anti-La (SS-B) Ab (RDL): <20 U (ref <20)
Anti-Ro (SS-A) Ab (RDL): <20 U (ref <20)
Anti-Scl-70 Ab (RDL): <20 U (ref <20)
Anti-Sm Ab (RDL): <20 U (ref <20)
Anti-TPO Ab (RDL): <9 [IU]/mL (ref <9.0)
Anti-dsDNA Ab by Farr(RDL): <8 [IU]/mL (ref <8.0)
C3 Complement (RDL): 186 mg/dL — ABNORMAL HIGH (ref 90–180)
C4 Complement (RDL): 30 mg/dL (ref 10–40)
Speckled Pattern: 1:40 {titer} — ABNORMAL HIGH
Spindle Apparatus Pattern: 1:40 {titer} — ABNORMAL HIGH

## 2023-12-01 LAB — HYPERSENSITIVITY PNEUMONITIS
A. Pullulans Abs: NEGATIVE
A.Fumigatus #1 Abs: NEGATIVE
Micropolyspora faeni, IgG: NEGATIVE
Pigeon Serum Abs: NEGATIVE
Thermoact. Saccharii: NEGATIVE
Thermoactinomyces vulgaris, IgG: NEGATIVE

## 2023-12-01 LAB — MYOMARKER 3 PLUS PROFILE (RDL)

## 2023-12-01 LAB — CK: Total CK: 102 U/L (ref 30–208)

## 2023-12-01 LAB — ANCA PROFILE
Anti-MPO Antibodies: <0.2 U (ref 0.0–0.9)
Anti-PR3 Antibodies: <0.2 U (ref 0.0–0.9)
Atypical pANCA: <1:20 {titer}
C-ANCA: <1:20 {titer}
P-ANCA: <1:20 {titer}

## 2023-12-01 LAB — RHEUMATOID FACTOR: Rheumatoid fact SerPl-aCnc: <10 [IU]/mL (ref <14.0)

## 2023-12-01 LAB — ANTI-CCP AB, IGG + IGA (RDL): Anti-CCP Ab, IgG + IgA (RDL): <20 U (ref <20)

## 2023-12-01 LAB — ALDOLASE: Aldolase: 5.1 U/L (ref 3.3–10.3)

## 2023-12-01 LAB — ANA 12 PLUS PROFILE (RDL): Anti-Nuclear Ab by IFA (RDL): POSITIVE — AB

## 2023-12-12 ENCOUNTER — Other Ambulatory Visit: Payer: Self-pay

## 2023-12-12 NOTE — Patient Outreach (Unsigned)
 Complex Care Management   Visit Note  12/12/2023  Name:  Luke Mckee MRN: [id number] DOB: February 06, 1940  Situation: Referral received for Complex Care Management related to {Criteria:32550} I obtained verbal consent from {CHL AMB Patient/Caregiver:[id number]}.  Visit completed with {CHL AMB Patient/Caregiver:28184}  {VISIT LOCATION:32553}  Background:   Past Medical History:  Diagnosis Date   Anxiety    Arthritis    CAD (coronary artery disease) 2005   s/p CABG, DES   CHF (congestive heart failure) (HCC)    in EPIC care everywhere   Chronic kidney disease    COPD (chronic obstructive pulmonary disease) (HCC)    in EPIC care everywhere   Depression    Heart murmur    History of stroke    Hypothyroidism    OSA (obstructive sleep apnea)    Paroxysmal A-fib (HCC)    in EPIC careeverywhere

## 2023-12-14 ENCOUNTER — Other Ambulatory Visit: Admitting: Pharmacist

## 2023-12-14 ENCOUNTER — Other Ambulatory Visit: Payer: Self-pay | Admitting: Family Medicine

## 2023-12-14 DIAGNOSIS — I10 Essential (primary) hypertension: Secondary | ICD-10-CM

## 2023-12-14 DIAGNOSIS — R2689 Other abnormalities of gait and mobility: Secondary | ICD-10-CM

## 2023-12-14 DIAGNOSIS — R42 Dizziness and giddiness: Secondary | ICD-10-CM

## 2023-12-14 NOTE — Patient Instructions (Signed)
 Goals Addressed             This Visit's Progress    Pharmacy Goals       Check your blood pressure twice weekly, and any time you have concerning symptoms like headache, chest pain, dizziness, shortness of breath, or vision changes.   To appropriately check your blood pressure, make sure you do the following:  1) Avoid caffeine, exercise, or tobacco products for 30 minutes before checking. Empty your bladder. 2) Sit with your back supported in a flat-backed chair. Rest your arm on something flat (arm of the chair, table, etc). 3) Sit still with your feet flat on the floor, resting, for at least 5 minutes.  4) Check your blood pressure. Take 1-2 readings.  5) Write down these readings and bring with you to any provider appointments.  Bring your home blood pressure machine with you to a provider's office for accuracy comparison at least once a year.   Make sure you take your blood pressure medications before you come to any office visit, even if you were asked to fast for labs.   Our goal bad cholesterol, or LDL, is less than 70 . This is why it is important to continue taking your atorvastatin   Feel free to call me with any questions or concerns.   Sharyle Sia, PharmD, [facility] Clinical Pharmacist [facility] [phone]

## 2023-12-14 NOTE — Progress Notes (Signed)
 12/14/2023 Name: Luke Mckee MRN: [id number] DOB: [birth year]  Chief Complaint  Patient presents with   Medication Adherence   Medication Management    Basim Bartnik is a 83 y.o. year old male who was referred to the pharmacist by their PCP for assistance in managing complex medication management and medication adherence   Today speak with patient's daughter, Daelon Dunivan (listed on DPR), wife and patient   Subjective:   Care Team: Primary Care Provider: Edman Marsa PARAS, DO ; Next Scheduled Visit: 01/17/2024 Cardiologist: Perla Evalene PARAS, MD Neurologist: Jonette Lauraine Norris, PA Nurse Care Manager: Karoline Lima, RN/Mertel, [place] , RN ; Next Scheduled Visit: 01/02/2024 Pulmonologist: Isadora Hose, MD; Next Scheduled Visit: 02/15/2024  Medication Access/Adherence  Current Pharmacy:  [facility] [phone] - [place], [place] - [address] AT [facility] [address] [place] [address] Phone: [phone] Fax: [phone]  [facility] [address] [place] [address] Phone: [phone] Fax: [phone]   Patient reports affordability concerns with their medications: No  Patient reports access/transportation concerns to their pharmacy: No  Patient reports adherence concerns with their medications:  No   Uses weekly pillbox as filled by daughter and granddaughter    Shortness of Breath/Seasonal allergies:   Patient followed by [facility]   Current treatment: Wixela 100-50 mcg/act - 1 puff twice daily  Albuterol  HFA inahler - 1-2 puffs every 4 hours as needed Loratadine  10 mg daily Montelukast  10 mg QHS   Reports patient rinsing and spitting out after each use of Wixela  Reports patient avoiding respiratory triggers such as cold weather   Hypertension:   Current medications:  - atenolol  25 mg daily in morning - furosemide  80 mg - reports taking once daily,  but up to twice daily as needed   Patient has an automated, upper arm home BP cuff Deny checking home BP recently   Denies signs of hypotension, such as dizziness recently   Denies signs of swelling today   Current Physical Activity: reports patient more active/having more energy recently     Objective:  Lab Results  Component Value Date   HGBA1C 6.1 (H) 12/15/2022    Lab Results  Component Value Date   CREATININE 0.91 06/28/2023   BUN 16 06/28/2023   NA 140 06/28/2023   K 4.4 06/28/2023   CL 101 06/28/2023   CO2 31 06/28/2023    Lab Results  Component Value Date   CHOL 115 06/28/2023   HDL 41 06/28/2023   LDLCALC 59 06/28/2023   TRIG 71 06/28/2023   CHOLHDL 2.8 06/28/2023   BP Readings from Last 3 Encounters:  11/07/23 120/60  08/29/23 108/62  08/23/23 (!) 128/91   Pulse Readings from Last 3 Encounters:  11/07/23 64  08/29/23 72  08/17/23 87     Medications Reviewed Today     Reviewed by Alana Sharyle LABOR, RPH-CPP (Pharmacist) on 12/14/23 at 1609  Med List Status: <None>   Medication Order Taking? Sig Documenting Provider Last Dose Status Informant  acetaminophen  (TYLENOL ) 650 MG CR tablet [id number]  Take 1,300 mg by mouth at bedtime as needed for pain. [provider]  Active Spouse/Significant Other  albuterol  (VENTOLIN  HFA) 108 (90 Base) MCG/ACT inhaler [id number]  Inhale 1-2 puffs into the lungs every 4 (four) hours as needed for wheezing or shortness of breath (cough). Edman Marsa PARAS, DO  Active   aspirin  EC 81 MG  tablet [id number]  Take 81 mg by mouth daily. [provider]  Active Spouse/Significant Other  atenolol  (TENORMIN ) 25 MG tablet [id number] Yes Take 1 tablet (25 mg total) by mouth every morning. Edman Marsa PARAS, DO  Active   atorvastatin  (LIPITOR ) 80 MG tablet [id number]  Take 1 tablet (80 mg total) by mouth at bedtime. Edman Marsa PARAS, DO  Active   Calcium  Carb-Cholecalciferol (CALCIUM  600 + D  PO) [id number]  Take 1 tablet by mouth daily. [provider]  Active Spouse/Significant Other  Cholecalciferol (VITAMIN D) 50 MCG (2000 UT) tablet [id number]  Take 2,000 Units by mouth daily. [provider]  Active Spouse/Significant Other  clopidogrel  (PLAVIX ) 75 MG tablet [id number]  Take 1 tablet (75 mg total) by mouth daily. Vivienne Lonni Ingle, NP  Active   clotrimazole -betamethasone  (LOTRISONE ) cream [id number]  Apply twice a day for 1-2 weeks, may repeat if need in future Edman Marsa PARAS, DO  Active Spouse/Significant Other           Med Note MALKA LYNWOOD CHRISTELLA [name] 19, 2023  1:17 PM) > 1 month since last used    Coenzyme Q10 100 MG TABS [id number]  Take 100 mg by mouth daily. [provider]  Active Spouse/Significant Other  colchicine  0.6 MG tablet [id number]  For acute gout flare take 2 pills at once, then take a 3rd pill 2 hours later. Then take 1 pill daily for 7-10 days or until resolved. Edman Marsa PARAS, DO  Active Spouse/Significant Other           Med Note MALKA LYNWOOD CHRISTELLA [name] 19, 2023  1:18 PM) > 1 month since last used  diclofenac sodium (VOLTAREN) 1 % GEL [id number]  Apply 2 g topically 4 (four) times daily as needed for pain. Foot pain [provider]  Active Spouse/Significant Other           Med Note MALKA LYNWOOD CHRISTELLA   Thu May 19, 2023  1:18 PM) > 1 month since last used  escitalopram  (LEXAPRO ) 10 MG tablet [id number]  Take 1 tablet (10 mg total) by mouth daily. Karamalegos, Marsa PARAS, DO  Active   fluticasone -salmeterol [facility] INHUB) 100-50 MCG/ACT AEPB [id number] Yes Inhale 1 puff into the lungs 2 (two) times daily. Isadora Hose, MD  Active   furosemide  (LASIX ) 80 MG tablet [id number] Yes Take 1 tablet (80 mg total) by mouth daily. May take 2nd dose AS NEEDED for fluid retention. Edman Marsa PARAS, DO  Active   ipratropium (ATROVENT ) 0.06 % nasal spray [id number]  Place 2 sprays into both nostrils 4  (four) times daily as needed for rhinitis. Edman Marsa PARAS, DO  Active Spouse/Significant Other           Med Note MALKA LYNWOOD CHRISTELLA [name] 19, 2023  1:19 PM) > 1 month since last used  levothyroxine  (SYNTHROID ) 125 MCG tablet [id number]  Take 1 tablet (125 mcg total) by mouth daily before breakfast. Edman Marsa PARAS, DO  Active   loratadine  (CLARITIN ) 10 MG tablet [id number] Yes Take 10 mg by mouth daily. [provider]  Active Spouse/Significant Other  meclizine  (ANTIVERT ) 12.5 MG tablet [id number]  Take 1 tablet (12.5 mg total) by mouth 3 (three) times daily as needed for dizziness. Edman Marsa PARAS, DO  Active Spouse/Significant Other           Med Note MALKA LYNWOOD CHRISTELLA [name] 19, 2023  1:21 PM) > 1  month since last used  montelukast  (SINGULAIR ) 10 MG tablet [id number] Yes Take 1 tablet (10 mg total) by mouth at bedtime. Edman Marsa PARAS, DO  Active   Multiple Vitamin (MULTIVITAMIN) tablet [id number]  Take 1 tablet by mouth daily. [provider]  Active Spouse/Significant Other  nitroGLYCERIN  (NITROSTAT ) 0.4 MG SL tablet [id number]  Place 1 tablet (0.4 mg total) under the tongue every 5 (five) minutes as needed for chest pain. Edman Marsa PARAS, DO  Active Spouse/Significant Other           Med Note JACKOLYN, ZEA J   Mon Jun 20, 2023  2:22 PM) Prn  PATADAY 0.1 % ophthalmic solution [id number]  Place 1 drop into both eyes daily as needed for allergies. [provider]  Active Spouse/Significant Other  sennosides-docusate sodium  (SENOKOT-S) 8.6-50 MG tablet [id number]  Take 2 tablets by mouth daily. [provider]  Active Spouse/Significant Other  traZODone  (DESYREL ) 50 MG tablet [id number]  Take 1 tablet (50 mg total) by mouth at bedtime. Edman Marsa PARAS, DO  Active               Assessment/Plan:   Caregiver confirms separating multivitamin and calcium  doses (taking in evening) from morning  levothyroxine   Will send message to PCP as requested by patient/spouse - Requesting referral back to Physical Therapy to help with his balance (would like to go back to [facility])   Shortness of Breath/Seasonal allergies:   - Reviewed appropriate Wixela inhaler technique, including consistently rinsing and spitting out after each use     Hypertension: - Reviewed long term cardiovascular and renal outcomes of uncontrolled blood pressure - Remind patient to continue to take positional changes slowly - Recommend to monitor home blood pressure, keep log of results and have this record to review at upcoming medical appointments. Patient to contact provider office sooner if needed for readings outside of established parameters or symptoms     Follow Up Plan:   Patient denies further medication questions or concerns today Provide patient with contact information for clinic pharmacist to contact if needed in future for medication questions/concerns    Sharyle Sia, PharmD, [name], CPP Clinical Pharmacist [facility] [phone]

## 2023-12-14 NOTE — Patient Instructions (Addendum)
 Thank you for allowing the Complex Care Management team to participate in Luke Mckee care. It was great speaking with you!  The next nursing outreach is scheduled for January 02, 2024 at 1000. Please do not hesitate to contact the clinic if you require assistance prior to the next scheduled outreach.   Jackson Acron [facility] Population Health RN Care Manager Direct Dial: [phone]  Fax: [phone] Website: [email or web address]

## 2024-01-02 ENCOUNTER — Other Ambulatory Visit: Payer: Self-pay

## 2024-01-02 ENCOUNTER — Telehealth: Payer: Self-pay

## 2024-01-02 NOTE — Telephone Encounter (Signed)
 Copied from CRM #[id number]. Topic: Appointments - Scheduling Inquiry for Clinic >> Jan 02, 2024 10:50 AM Anairis L wrote: Reason for CRM: [facility] app was missed, can someone give them a call to reschedule.

## 2024-01-10 ENCOUNTER — Other Ambulatory Visit

## 2024-01-10 DIAGNOSIS — I10 Essential (primary) hypertension: Secondary | ICD-10-CM

## 2024-01-10 DIAGNOSIS — E039 Hypothyroidism, unspecified: Secondary | ICD-10-CM

## 2024-01-10 DIAGNOSIS — R351 Nocturia: Secondary | ICD-10-CM

## 2024-01-10 DIAGNOSIS — Z0000 Encounter for general adult medical examination without abnormal findings: Secondary | ICD-10-CM

## 2024-01-10 DIAGNOSIS — I25810 Atherosclerosis of coronary artery bypass graft(s) without angina pectoris: Secondary | ICD-10-CM

## 2024-01-10 DIAGNOSIS — R7303 Prediabetes: Secondary | ICD-10-CM

## 2024-01-10 LAB — LIPID PANEL
Cholesterol: 135 mg/dL
HDL: 50 mg/dL
LDL Cholesterol (Calc): 68 mg/dL
Non-HDL Cholesterol (Calc): 85 mg/dL
Total CHOL/HDL Ratio: 2.7 (calc)
Triglycerides: 89 mg/dL

## 2024-01-10 LAB — COMPREHENSIVE METABOLIC PANEL WITH GFR
AG Ratio: 1.4 (calc) (ref 1.0–2.5)
ALT: 13 U/L (ref 9–46)
AST: 16 U/L (ref 10–35)
Albumin: 4.3 g/dL (ref 3.6–5.1)
Alkaline phosphatase (APISO): 48 U/L (ref 35–144)
BUN: 25 mg/dL (ref 7–25)
CO2: 31 mmol/L (ref 20–32)
Calcium: 9.5 mg/dL (ref 8.6–10.3)
Chloride: 102 mmol/L (ref 98–110)
Creat: 0.95 mg/dL (ref 0.70–1.22)
Globulin: 3 g/dL (ref 1.9–3.7)
Glucose, Bld: 118 mg/dL — ABNORMAL HIGH (ref 65–99)
Potassium: 4.2 mmol/L (ref 3.5–5.3)
Sodium: 142 mmol/L (ref 135–146)
Total Bilirubin: 0.4 mg/dL (ref 0.2–1.2)
Total Protein: 7.3 g/dL (ref 6.1–8.1)
eGFR: 80 mL/min/1.73m2

## 2024-01-10 LAB — CBC WITH DIFFERENTIAL/PLATELET
Absolute Lymphocytes: 1560 {cells}/uL (ref 850–3900)
Absolute Monocytes: 580 {cells}/uL (ref 200–950)
Basophils Absolute: 58 {cells}/uL (ref 0–200)
Basophils Relative: 1 %
Eosinophils Absolute: 319 {cells}/uL (ref 15–500)
Eosinophils Relative: 5.5 %
HCT: 40.7 % (ref 39.4–51.1)
Hemoglobin: 13.1 g/dL — ABNORMAL LOW (ref 13.2–17.1)
MCH: 31.3 pg (ref 27.0–33.0)
MCHC: 32.2 g/dL (ref 31.6–35.4)
MCV: 97.4 fL (ref 81.4–101.7)
MPV: 9.2 fL (ref 7.5–12.5)
Monocytes Relative: 10 %
Neutro Abs: 3283 {cells}/uL (ref 1500–7800)
Neutrophils Relative %: 56.6 %
Platelets: 241 Thousand/uL (ref 140–400)
RBC: 4.18 Million/uL — ABNORMAL LOW (ref 4.20–5.80)
RDW: 13.3 % (ref 11.0–15.0)
Total Lymphocyte: 26.9 %
WBC: 5.8 Thousand/uL (ref 3.8–10.8)

## 2024-01-10 LAB — HEMOGLOBIN A1C
Hgb A1c MFr Bld: 6.2 % — ABNORMAL HIGH
Mean Plasma Glucose: 131 mg/dL
eAG (mmol/L): 7.3 mmol/L

## 2024-01-10 LAB — PSA: PSA: 1.54 ng/mL

## 2024-01-10 LAB — T4, FREE: Free T4: 1.6 ng/dL (ref 0.8–1.8)

## 2024-01-11 ENCOUNTER — Other Ambulatory Visit: Payer: Self-pay

## 2024-01-11 VITALS — BP 110/80 | Wt 221.0 lb

## 2024-01-11 DIAGNOSIS — I10 Essential (primary) hypertension: Secondary | ICD-10-CM

## 2024-01-11 NOTE — Patient Outreach (Signed)
 Complex Care Management   Visit Note  01/11/2024  Name:  Luke Mckee MRN: [id number] DOB: January 21, 1940  Situation: Referral received for Complex Care Management related to htn I obtained verbal consent from Patient POA.  Visit completed with Patient POA  on the phone  Background:   Past Medical History:  Diagnosis Date   Anxiety    Arthritis    CAD (coronary artery disease) 2005   s/p CABG, DES   CHF (congestive heart failure) (HCC)    in EPIC care everywhere   Chronic kidney disease    COPD (chronic obstructive pulmonary disease) (HCC)    in EPIC care everywhere   Depression    Heart murmur    History of stroke    Hypothyroidism    OSA (obstructive sleep apnea)    Paroxysmal A-fib (HCC)    in EPIC careeverywhere    Assessment: Patient Reported Symptoms:  Cognitive Cognitive Status: No symptoms reported   Healing Pattern: Average  Neurological Neurological Review of Symptoms: No symptoms reported Neurological Management Strategies: Coping strategies, Medication therapy Neurological Self-Management Outcome: 4 (good)  HEENT HEENT Symptoms Reported: No symptoms reported HEENT Management Strategies: Medical device, Routine screening HEENT Self-Management Outcome: 4 (good)    Cardiovascular Cardiovascular Symptoms Reported: No symptoms reported Does patient have uncontrolled Hypertension?: No Cardiovascular Management Strategies: Adequate rest Do You Have a Working Readable Scale?: Yes Weight: 221 lb (100.2 kg) Cardiovascular Self-Management Outcome: 4 (good)  Respiratory Respiratory Symptoms Reported: No symptoms reported Respiratory Management Strategies: Routine screening Respiratory Self-Management Outcome: 4 (good)  Endocrine Endocrine Symptoms Reported: No symptoms reported Is patient diabetic?: No Endocrine Self-Management Outcome: 4 (good)  Gastrointestinal Gastrointestinal Symptoms Reported: No symptoms reported Gastrointestinal Management Strategies: Diet  modification, Medication therapy Gastrointestinal Self-Management Outcome: 4 (good) Nutrition Risk Screen (CP): No indicators present  Genitourinary Genitourinary Symptoms Reported: No symptoms reported Genitourinary Management Strategies: Activity Genitourinary Self-Management Outcome: 4 (good)  Integumentary Integumentary Symptoms Reported: Bruising Skin Management Strategies: Routine screening Skin Self-Management Outcome: 4 (good)  Musculoskeletal Musculoskelatal Symptoms Reviewed: Unsteady gait Musculoskeletal Management Strategies: Medication therapy, Weight management Musculoskeletal Self-Management Outcome: 4 (good) Falls in the past year?: No Number of falls in past year: 1 or less Was there an injury with Fall?: No Fall Risk Category Calculator: 0 Patient Fall Risk Level: Low Fall Risk Fall risk Follow up: Falls evaluation completed, Education provided, Falls prevention discussed  Psychosocial   Behavioral Management Strategies: Coping strategies, Medication therapy, Adequate rest Behavioral Health Self-Management Outcome: 4 (good) Major Change/Loss/Stressor/Fears (CP): Medical condition, family Techniques to [place] with Loss/Stress/Change: Withdraw, Medication, Diversional activities Quality of Family Relationships: supportive, helpful, involved    01/11/2024    PHQ2-9 Depression Screening   Little interest or pleasure in doing things Several days  Feeling down, depressed, or hopeless Several days  PHQ-2 - Total Score 2  Trouble falling or staying asleep, or sleeping too much Several days  Feeling tired or having little energy Several days  Poor appetite or overeating  Not at all  Feeling bad about yourself - or that you are a failure or have let yourself or your family down Not at all  Trouble concentrating on things, such as reading the newspaper or watching television Not at all  Moving or speaking so slowly that other people could have noticed.  Or the opposite -  being so fidgety or restless that you have been moving around a lot more than usual Not at all  Thoughts that you would be better off dead, or  hurting yourself in some way Not at all  PHQ2-9 Total Score 4  If you checked off any problems, how difficult have these problems made it for you to do your work, take care of things at home, or get along with other people    Depression Interventions/Treatment      Today's Vitals   01/11/24 1057  BP: 110/80  Weight: 221 lb (100.2 kg)   Pain Scale: 0-10 Pain Score: 0-No pain  Medications Reviewed Today     Reviewed by Nivia, Azarah Dacy , RN (Registered Nurse) on 01/11/24 at 1054  Med List Status: <None>   Medication Order Taking? Sig Documenting Provider Last Dose Status Informant  acetaminophen  (TYLENOL ) 650 MG CR tablet [id number] Yes Take 1,300 mg by mouth at bedtime as needed for pain. [provider]  Active Spouse/Significant Other  albuterol  (VENTOLIN  HFA) 108 (90 Base) MCG/ACT inhaler [id number] Yes Inhale 1-2 puffs into the lungs every 4 (four) hours as needed for wheezing or shortness of breath (cough). Edman Marsa PARAS, DO  Active   aspirin  EC 81 MG tablet [id number] Yes Take 81 mg by mouth daily. [provider]  Active Spouse/Significant Other  atenolol  (TENORMIN ) 25 MG tablet [id number] Yes Take 1 tablet (25 mg total) by mouth every morning. Edman Marsa PARAS, DO  Active   atorvastatin  (LIPITOR ) 80 MG tablet [id number] Yes Take 1 tablet (80 mg total) by mouth at bedtime. Edman Marsa PARAS, DO  Active   Calcium  Carb-Cholecalciferol (CALCIUM  600 + D PO) [id number] Yes Take 1 tablet by mouth daily. [provider]  Active Spouse/Significant Other  Cholecalciferol (VITAMIN D) 50 MCG (2000 UT) tablet [id number] Yes Take 2,000 Units by mouth daily. [provider]  Active Spouse/Significant Other  clopidogrel  (PLAVIX ) 75 MG tablet [id number] Yes Take 1 tablet (75 mg total) by mouth daily. Vivienne Lonni Ingle, NP  Active   clotrimazole -betamethasone  (LOTRISONE ) cream [id number] Yes Apply twice a day for 1-2 weeks, may repeat if need in future Edman Marsa PARAS, DO  Active Spouse/Significant Other           Med Note MALKA LYNWOOD CHRISTELLA [name] 19, 2023  1:17 PM) > 1 month since last used    Coenzyme Q10 100 MG TABS [id number] Yes Take 100 mg by mouth daily. [provider]  Active Spouse/Significant Other  colchicine  0.6 MG tablet [id number] Yes For acute gout flare take 2 pills at once, then take a 3rd pill 2 hours later. Then take 1 pill daily for 7-10 days or until resolved. Edman Marsa PARAS, DO  Active Spouse/Significant Other           Med Note MALKA LYNWOOD CHRISTELLA [name] 19, 2023  1:18 PM) > 1 month since last used  diclofenac sodium (VOLTAREN) 1 % GEL [id number] Yes Apply 2 g topically 4 (four) times daily as needed for pain. Foot pain [provider]  Active Spouse/Significant Other           Med Note MALKA LYNWOOD CHRISTELLA   Thu May 19, 2023  1:18 PM) > 1 month since last used  escitalopram  (LEXAPRO ) 10 MG tablet [id number] Yes Take 1 tablet (10 mg total) by mouth daily. Karamalegos, Marsa PARAS, DO  Active   fluticasone -salmeterol [facility] INHUB) 100-50 MCG/ACT AEPB [id number] Yes Inhale 1 puff into the lungs 2 (two) times daily. Isadora Hose, MD  Active   furosemide  (LASIX ) 80 MG tablet [id number] Yes Take 1 tablet (80 mg total) by mouth  daily. May take 2nd dose AS NEEDED for fluid retention. Edman Marsa PARAS, DO  Active   ipratropium (ATROVENT ) 0.06 % nasal spray [id number] Yes Place 2 sprays into both nostrils 4 (four) times daily as needed for rhinitis. Edman Marsa PARAS, DO  Active Spouse/Significant Other           Med Note MALKA LYNWOOD CHRISTELLA [name] 19, 2023  1:19 PM) > 1 month since last used  levothyroxine  (SYNTHROID ) 125 MCG tablet [id number] Yes Take 1 tablet (125 mcg total) by mouth daily before breakfast. Edman Marsa PARAS,  DO  Active   loratadine  (CLARITIN ) 10 MG tablet [id number] Yes Take 10 mg by mouth daily. [provider]  Active Spouse/Significant Other  meclizine  (ANTIVERT ) 12.5 MG tablet [id number] Yes Take 1 tablet (12.5 mg total) by mouth 3 (three) times daily as needed for dizziness. Edman Marsa PARAS, DO  Active Spouse/Significant Other           Med Note MALKA LYNWOOD CHRISTELLA   Thu May 19, 2023  1:21 PM) > 1 month since last used  montelukast  (SINGULAIR ) 10 MG tablet [id number] Yes Take 1 tablet (10 mg total) by mouth at bedtime. Edman Marsa PARAS, DO  Active   Multiple Vitamin (MULTIVITAMIN) tablet [id number] Yes Take 1 tablet by mouth daily. [provider]  Active Spouse/Significant Other  nitroGLYCERIN  (NITROSTAT ) 0.4 MG SL tablet [id number] Yes Place 1 tablet (0.4 mg total) under the tongue every 5 (five) minutes as needed for chest pain. Edman Marsa PARAS, DO  Active Spouse/Significant Other           Med Note JACKOLYN, ZEA J   Mon Jun 20, 2023  2:22 PM) Prn  PATADAY 0.1 % ophthalmic solution [id number] Yes Place 1 drop into both eyes daily as needed for allergies. [provider]  Active Spouse/Significant Other  sennosides-docusate sodium  (SENOKOT-S) 8.6-50 MG tablet [id number] Yes Take 2 tablets by mouth daily. [provider]  Active Spouse/Significant Other  traZODone  (DESYREL ) 50 MG tablet [id number] Yes Take 1 tablet (50 mg total) by mouth at bedtime. Edman Marsa PARAS, DO  Active             Recommendation:   Referral to: LCSW  Follow Up Plan:   Telephone follow-up in 1 month  Mariaha Ellington RN RN Care Manager [facility] [phone]

## 2024-01-11 NOTE — Patient Instructions (Signed)
 Visit Information  Thank you for taking time to visit with me today. Please don't hesitate to contact me if I can be of assistance to you before our next scheduled appointment.  Your next care management appointment is by telephone on 02/09/24 at 11:00am  Telephone follow-up in 1 month  Please call the care guide team at [phone] if you need to cancel, schedule, or reschedule an appointment.   Please call the Suicide and Crisis Lifeline: 988 call the [place]  National Suicide Prevention Lifeline: 517 503 6065 or TTY: 340-373-6364 TTY (704) 878-1722) to talk to a trained counselor call 1-800-273-TALK (toll free, 24 hour hotline) if you are experiencing a Mental Health or Behavioral Health Crisis or need someone to talk to.  Stevey Stapleton RN RN Care Manager [facility] [phone]

## 2024-01-17 ENCOUNTER — Encounter: Payer: Self-pay | Admitting: Family Medicine

## 2024-01-17 ENCOUNTER — Ambulatory Visit: Admitting: Family Medicine

## 2024-01-17 VITALS — BP 130/82 | HR 69 | Ht 68.0 in | Wt 227.2 lb

## 2024-01-17 DIAGNOSIS — I25810 Atherosclerosis of coronary artery bypass graft(s) without angina pectoris: Secondary | ICD-10-CM

## 2024-01-17 DIAGNOSIS — I679 Cerebrovascular disease, unspecified: Secondary | ICD-10-CM | POA: Diagnosis not present

## 2024-01-17 DIAGNOSIS — F419 Anxiety disorder, unspecified: Secondary | ICD-10-CM | POA: Diagnosis not present

## 2024-01-17 DIAGNOSIS — G8191 Hemiplegia, unspecified affecting right dominant side: Secondary | ICD-10-CM

## 2024-01-17 DIAGNOSIS — F5104 Psychophysiologic insomnia: Secondary | ICD-10-CM

## 2024-01-17 DIAGNOSIS — G4733 Obstructive sleep apnea (adult) (pediatric): Secondary | ICD-10-CM

## 2024-01-17 DIAGNOSIS — Z0000 Encounter for general adult medical examination without abnormal findings: Secondary | ICD-10-CM

## 2024-01-17 DIAGNOSIS — I5032 Chronic diastolic (congestive) heart failure: Secondary | ICD-10-CM | POA: Insufficient documentation

## 2024-01-17 DIAGNOSIS — E039 Hypothyroidism, unspecified: Secondary | ICD-10-CM

## 2024-01-17 DIAGNOSIS — I10 Essential (primary) hypertension: Secondary | ICD-10-CM

## 2024-01-17 DIAGNOSIS — J3089 Other allergic rhinitis: Secondary | ICD-10-CM

## 2024-01-17 DIAGNOSIS — N529 Male erectile dysfunction, unspecified: Secondary | ICD-10-CM | POA: Diagnosis not present

## 2024-01-17 DIAGNOSIS — I6930 Unspecified sequelae of cerebral infarction: Secondary | ICD-10-CM | POA: Diagnosis not present

## 2024-01-17 DIAGNOSIS — E782 Mixed hyperlipidemia: Secondary | ICD-10-CM

## 2024-01-17 DIAGNOSIS — F3341 Major depressive disorder, recurrent, in partial remission: Secondary | ICD-10-CM | POA: Diagnosis not present

## 2024-01-17 DIAGNOSIS — J849 Interstitial pulmonary disease, unspecified: Secondary | ICD-10-CM | POA: Diagnosis not present

## 2024-01-17 MED ORDER — TRAZODONE HCL 50 MG PO TABS: 50.0000 mg | ORAL_TABLET | Freq: Every day | ORAL | 1 refills | Status: AC

## 2024-01-17 MED ORDER — FUROSEMIDE 80 MG PO TABS: 80.0000 mg | ORAL_TABLET | Freq: Every day | ORAL | 1 refills | Status: AC

## 2024-01-17 MED ORDER — LEVOTHYROXINE SODIUM 125 MCG PO TABS: 125.0000 ug | ORAL_TABLET | Freq: Every day | ORAL | 1 refills | Status: AC

## 2024-01-17 MED ORDER — ATORVASTATIN CALCIUM 80 MG PO TABS: 80.0000 mg | ORAL_TABLET | Freq: Every day | ORAL | 1 refills | Status: AC

## 2024-01-17 MED ORDER — ATENOLOL 25 MG PO TABS: 25.0000 mg | ORAL_TABLET | Freq: Every morning | ORAL | 1 refills | Status: AC

## 2024-01-17 MED ORDER — SERTRALINE HCL 50 MG PO TABS: 50.0000 mg | ORAL_TABLET | Freq: Every day | ORAL | 0 refills | Status: DC

## 2024-01-17 MED ORDER — SILDENAFIL CITRATE 50 MG PO TABS: 50.0000 mg | ORAL_TABLET | Freq: Every day | ORAL | 2 refills | Status: AC | PRN

## 2024-01-17 MED ORDER — MONTELUKAST SODIUM 10 MG PO TABS: 10.0000 mg | ORAL_TABLET | Freq: Every day | ORAL | 1 refills | Status: AC

## 2024-01-17 NOTE — Progress Notes (Incomplete)
 "  Subjective:    Patient ID: Luke Mckee, male    DOB: 06-Apr-1940, 84 y.o.   MRN: [id number]  Luke Mckee is a 84 y.o. male presenting on 01/17/2024 for Annual Exam   HPI  Discussed the use of AI scribe software for clinical note transcription with the patient, who gave verbal consent to proceed.  History of Present Illness   Luke Mckee is an 84 year old male who presents for an annual physical exam and to discuss recent mood changes. He is accompanied by his daughter, who is also his primary caregiver.  Mood disturbance - Experienced a 'meltdown' last week characterized by feelings of being overwhelmed and agitated, particularly related to wife's increasing forgetfulness and safety concerns at home. - Expressed concern about potentially harming his wife, but emphasized he would never do so. - Considering revisiting previous medications for mood stabilization due to recent episode.  Antidepressant therapy - Currently taking Lexapro  (escitalopram ), started approximately two years ago after Zoloft  (sertraline ) was deemed less effective. - Considering switching back to Zoloft , recalling previous doses of 100 mg to 200 mg, as he feels it may better address current mood symptoms.  Medication access and drug interaction - Recently unable to obtain a medication prescribed by urologist due to pharmacy concern for interaction with nitrates prescribed for cardiac condition. - Has only used nitrate medication once since 2020.  Balance and gait instability - History of balance issues and knee instability resulting in falls.  Respiratory symptoms and interstitial lung disease - Increased frequency of sneezing without significant wheezing. - History of interstitial lung disease, currently managed with Wixela.  Swallowing function - Recent swallowing test was normal. - Experiences some swallowing difficulty attributed to age.  Postoperative complications - History of open-heart  surgery with subsequent removal of sternum due to complications during recovery.       *** He had issue with    Hypertension and diuretic therapy Chronic Diastolic Heart Failure - Currently taking atenolol  and furosemide  80 mg twice daily. - Experiencing inconvenience from frequent urination, particularly during church services, and interested in reducing furosemide  dose. Often he has not had fluid retention or abnormal weight gain feels better without taking this med. - Caregiver confirms compliance with atenolol . - No chest pain, shortness of breath, or palpitations.   Coronary artery disease and antiplatelet therapy - Currently taking baby aspirin , atorvastatin , and clopidogrel . - Caregiver confirms compliance with aspirin  and clopidogrel . - Cholesterol is well controlled on current regimen.   Electrolyte and renal function - Recent laboratory results show normal kidney and liver function. - Sodium and potassium levels have improved.   Hematologic status - Hemoglobin is 11.9, stable for several months.   Glycemic control - Blood glucose levels consistently range from 110 to 118 mg/dL. - Most recent hemoglobin A1c is 6.1, the best result in four years.   Weight and nutritional status - Recent weight gain of approximately 10 pounds, current weight 223 pounds (previous low 213 pounds). - Caregiver describes a well-rounded diet, making dietary deficiency unlikely.   Psychiatric symptoms and medication use - Currently taking Lexapro , with consideration of dose reduction due to less agitation when doses are missed. - Lorazepam  used as needed for sleep and anxiety, but not used frequently.   Use of as-needed medications - Colchicine  used only once since last hospitalization. - Meclizine  not used for a long time.     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 S/p loop recorder. No identified AFib. Denies any chest pain, new focal weakness, dyspnea, worsening edema, near syncope or syncope   Elevated A1c / PreDM   Hypothyroidism Chronic problem Last lab now 12/2022 showed normal Thyroid  panel and T4 He is taking Levothyroxine  125mcg daily, currently doing well - with some improvement after med adjustment of changing dosing so does not take other meds with levothyroxine .   Psychological Insomnia Failed Ambien Improved on Trazodone  nightly.   OSA on CPAP Followed by Pulmonology, no repeat sleep study - Today reports that sleep apnea is well controlled. He uses the CPAP machine every night. Tolerates the machine well, and thinks that sleeps better with it and feels good. No new concerns or symptoms.  ILD ***Stable on Wixella Monitoring pulmonary nodules.  ***Falls recurrent PT  Health Maintenance: ***     01/17/2024    2:25 PM 01/11/2024   11:03 AM 11/04/2023    4:08 PM  Depression screen PHQ 2/9  Decreased Interest 1 1 1   Down, Depressed, Hopeless 1 1 1   PHQ - 2 Score 2 2 2   Altered sleeping 1 1 0  Tired, decreased energy 1 1 1   Change in appetite 0 0 0  Feeling bad or failure about yourself  0 0 0  Trouble concentrating 0 0 0  Moving slowly or fidgety/restless 0 0 0  Suicidal thoughts 0 0 0  PHQ-9 Score 4 4 3    Difficult doing work/chores Somewhat difficult  Not difficult at all     Data saved with a previous flowsheet row definition       01/11/2024   11:05 AM 07/11/2023    2:45 PM 05/10/2023    3:00 PM 03/25/2023    2:08 PM  GAD 7 : Generalized Anxiety Score  Nervous, Anxious, on Edge 1 0 1 1  Control/stop worrying 1 0 1   Worry too much - different things 1 1 0 1  Trouble relaxing 1 0 0   Restless 1 2 0   Easily annoyed or irritable 1 1 1    Afraid - awful might happen 1 0 1   Total GAD 7 Score 7 4 4    Anxiety Difficulty Somewhat difficult  Somewhat difficult Somewhat difficult      Past Medical History:  Diagnosis Date   Anxiety    Arthritis    CAD (coronary artery disease) 2005   s/p CABG, DES   CHF (congestive heart failure) (HCC)    in EPIC care everywhere   Chronic kidney disease    COPD (chronic obstructive pulmonary disease) (HCC)    in EPIC care everywhere   Depression    Heart murmur    History of stroke    Hypothyroidism    OSA (obstructive sleep apnea)    Paroxysmal A-fib (HCC)    in EPIC careeverywhere   Past Surgical History:  Procedure Laterality Date   APPENDECTOMY     BUNIONECTOMY     CATARACT EXTRACTION     Right eye   CATARACT EXTRACTION W/PHACO Left 01/19/2021   Procedure: CATARACT EXTRACTION PHACO AND INTRAOCULAR LENS PLACEMENT (IOC) LEFT 3.09 00:28.3;  Surgeon: Myrna Adine Anes, MD;  Location: [facility];  Service: Ophthalmology;  Laterality: Left;   COLONOSCOPY  07/06/2004   CORONARY ARTERY BYPASS GRAFT  2005   CORONARY BALLOON ANGIOPLASTY N/A 05/19/2023   Procedure: CORONARY BALLOON  ANGIOPLASTY;  Surgeon: Anner Alm ORN, MD;  Location: [facility] CV [facility];  Service: Cardiovascular;  Laterality: N/A;   LOOP RECORDER INSERTION N/A 07/25/2018   Procedure: LOOP RECORDER INSERTION;  Surgeon: Inocencio Soyla Lunger, MD;  Location: [facility] INVASIVE CV LAB;  Service: Cardiovascular;  Laterality: N/A;   RIGHT/LEFT HEART CATH AND CORONARY ANGIOGRAPHY Bilateral 05/19/2023   Procedure: RIGHT/LEFT HEART CATH AND CORONARY ANGIOGRAPHY;  Surgeon: Anner Alm ORN, MD;  Location: [facility] INVASIVE CV LAB;  Service: Cardiovascular;  Laterality: Bilateral;   TOTAL HIP ARTHROPLASTY Right 11/23/2011   TOTAL HIP ARTHROPLASTY Left 09/07/2011   TOTAL KNEE ARTHROPLASTY Right 02/15/2018   VASECTOMY  1978   Social History   Socioeconomic History   Marital status: Married    Spouse name: Not on file   Number of children: Not on file   Years of education: Not on file   Highest  education level: Not on file  Occupational History   Occupation: retired  Tobacco Use   Smoking status: Never   Smokeless tobacco: Never  Vaping Use   Vaping status: Never Used  Substance and Sexual Activity   Alcohol use: Not Currently    Comment: past   Drug use: Never   Sexual activity: Not on file  Other Topics Concern   Not on file  Social History Narrative   Not on file   Social Drivers of Health   Tobacco Use: Low Risk (01/17/2024)   Patient History    Smoking Tobacco Use: Never    Smokeless Tobacco Use: Never    Passive Exposure: Not on file  Financial Resource Strain: Low Risk (11/04/2023)   Overall Financial Resource Strain (CARDIA)    Difficulty of Paying Living Expenses: Not hard at all  Food Insecurity: No Food Insecurity (11/04/2023)   Epic    Worried About Radiation Protection Practitioner of Food in the Last Year: Never true    Ran Out of Food in the Last Year: Never true  Transportation Needs: No Transportation Needs (11/04/2023)   Epic    Lack of Transportation (Medical): No    Lack of Transportation (Non-Medical): No  Physical Activity: Insufficiently Active (11/04/2023)   Exercise Vital Sign    Days of Exercise per Week: 3 days    Minutes of Exercise per Session: 30 min  Stress: No Stress Concern Present (11/04/2023)   [employer] of Occupational Health - Occupational Stress Questionnaire    Feeling of Stress: Not at all  Social Connections: Moderately Integrated (11/04/2023)   Social Connection and Isolation Panel    Frequency of Communication with Friends and Family: More than three times a week    Frequency of Social Gatherings with Friends and Family: Three times a week    Attends Religious Services: More than 4 times per year    Active Member of Clubs or Organizations: Patient declined    Attends Banker Meetings: Never    Marital Status: Married  Catering Manager Violence: Not At Risk (11/04/2023)   Epic    Fear of  Current or Ex-Partner: No    Emotionally Abused: No    Physically Abused: No    Sexually Abused: No  Depression (PHQ2-9): Low Risk (01/17/2024)   Depression (PHQ2-9)    PHQ-2 Score: 4  Alcohol Screen: Low Risk (11/04/2023)   Alcohol Screen    Last Alcohol Screening Score (AUDIT): 0  Housing: Unknown (11/04/2023)   Epic    Unable to Pay for Housing in the Last Year: No  Number of Times Moved in the Last Year: Not on file    Homeless in the Last Year: No  Utilities: Not At Risk (11/04/2023)   Epic    Threatened with loss of utilities: No  Health Literacy: Adequate Health Literacy (11/04/2023)   B1300 Health Literacy    Frequency of need for help with medical instructions: Never   Family History  Problem Relation Age of Onset   Anxiety disorder Sister    Current Outpatient Medications on File Prior to Visit  Medication Sig   acetaminophen  (TYLENOL ) 650 MG CR tablet Take 1,300 mg by mouth at bedtime as needed for pain.   albuterol  (VENTOLIN  HFA) 108 (90 Base) MCG/ACT inhaler Inhale 1-2 puffs into the lungs every 4 (four) hours as needed for wheezing or shortness of breath (cough).   aspirin  EC 81 MG tablet Take 81 mg by mouth daily.   Calcium  Carb-Cholecalciferol (CALCIUM  600 + D PO) Take 1 tablet by mouth daily.   Cholecalciferol (VITAMIN D) 50 MCG (2000 UT) tablet Take 2,000 Units by mouth daily.   clopidogrel  (PLAVIX ) 75 MG tablet Take 1 tablet (75 mg total) by mouth daily.   clotrimazole -betamethasone  (LOTRISONE ) cream Apply twice a day for 1-2 weeks, may repeat if need in future   Coenzyme Q10 100 MG TABS Take 100 mg by mouth daily.   colchicine  0.6 MG tablet For acute gout flare take 2 pills at once, then take a 3rd pill 2 hours later. Then take 1 pill daily for 7-10 days or until resolved.   diclofenac sodium (VOLTAREN) 1 % GEL Apply 2 g topically 4 (four) times daily as needed for pain. Foot pain   fluticasone -salmeterol (WIXELA INHUB) 100-50 MCG/ACT  AEPB Inhale 1 puff into the lungs 2 (two) times daily.   ipratropium (ATROVENT ) 0.06 % nasal spray Place 2 sprays into both nostrils 4 (four) times daily as needed for rhinitis.   loratadine  (CLARITIN ) 10 MG tablet Take 10 mg by mouth daily.   meclizine  (ANTIVERT ) 12.5 MG tablet Take 1 tablet (12.5 mg total) by mouth 3 (three) times daily as needed for dizziness.   Multiple Vitamin (MULTIVITAMIN) tablet Take 1 tablet by mouth daily.   PATADAY 0.1 % ophthalmic solution Place 1 drop into both eyes daily as needed for allergies.   sennosides-docusate sodium  (SENOKOT-S) 8.6-50 MG tablet Take 2 tablets by mouth daily.   No current facility-administered medications on file prior to visit.    Review of Systems  Constitutional:  Negative for activity change, appetite change, chills, diaphoresis, fatigue and fever.  HENT:  Negative for congestion and hearing loss.   Eyes:  Negative for visual disturbance.  Respiratory:  Negative for cough, chest tightness, shortness of breath and wheezing.   Cardiovascular:  Negative for chest pain, palpitations and leg swelling.  Gastrointestinal:  Negative for abdominal pain, constipation, diarrhea, nausea and vomiting.  Genitourinary:  Negative for dysuria, frequency and hematuria.  Musculoskeletal:  Negative for arthralgias and neck pain.  Skin:  Negative for rash.  Neurological:  Negative for dizziness, weakness, light-headedness, numbness and headaches.  Hematological:  Negative for adenopathy.  Psychiatric/Behavioral:  Positive for dysphoric mood. Negative for behavioral problems and sleep disturbance.    Per HPI unless specifically indicated above     Objective:    BP 130/82 (BP Location: Right Arm, Patient Position: Sitting, Cuff Size: Normal)   Pulse 69   Ht 5' 8 (1.727 m)   Wt 227 lb 4 oz (103.1 kg)   SpO2 95%  BMI 34.55 kg/m   Wt Readings from Last 3 Encounters:  01/17/24 227 lb 4 oz (103.1 kg)  01/11/24 221 lb (100.2 kg)  11/07/23  228 lb (103.4 kg)    Physical Exam Vitals and nursing note reviewed.  Constitutional:      General: He is not in acute distress.    Appearance: He is well-developed. He is obese. He is not diaphoretic.     Comments: Well-appearing, comfortable, cooperative  HENT:     Head: Normocephalic and atraumatic.  Eyes:     General:        Right eye: No discharge.        Left eye: No discharge.     Conjunctiva/sclera: Conjunctivae normal.     Pupils: Pupils are equal, round, and reactive to light.  Neck:     Thyroid : No thyromegaly.  Cardiovascular:     Rate and Rhythm: Normal rate and regular rhythm.     Pulses: Normal pulses.     Heart sounds: Normal heart sounds. No murmur heard. Pulmonary:     Effort: Pulmonary effort is normal. No respiratory distress.     Breath sounds: Normal breath sounds. No wheezing or rales.  Abdominal:     General: Bowel sounds are normal. There is no distension.     Palpations: Abdomen is soft. There is no mass.     Tenderness: There is no abdominal tenderness.  Musculoskeletal:        General: No tenderness.     Cervical back: Normal range of motion and neck supple.  Lymphadenopathy:     Cervical: No cervical adenopathy.  Skin:    General: Skin is warm and dry.     Findings: No erythema or rash.  Neurological:     Mental Status: He is alert and oriented to person, place, and time.     Comments: Distal sensation intact to light touch all extremities  Psychiatric:        Mood and Affect: Mood normal.        Behavior: Behavior normal.        Thought Content: Thought content normal.     Comments: Well groomed, good eye contact, normal speech and thoughts     Results for orders placed or performed in visit on 01/10/24  T4, free   Collection Time: 01/09/24  9:35 AM  Result Value Ref Range   Free T4 1.6 0.8 - 1.8 ng/dL  Comprehensive metabolic panel with GFR   Collection Time: 01/09/24  9:35 AM  Result Value Ref Range   Glucose, Bld 118 (H) 65 - 99  mg/dL   BUN 25 7 - 25 mg/dL   Creat 9.04 9.29 - 8.77 mg/dL   eGFR 80 > OR = 60 fO/fpw/8.26f7   BUN/Creatinine Ratio SEE NOTE: 6 - 22 (calc)   Sodium 142 135 - 146 mmol/L   Potassium 4.2 3.5 - 5.3 mmol/L   Chloride 102 98 - 110 mmol/L   CO2 31 20 - 32 mmol/L   Calcium  9.5 8.6 - 10.3 mg/dL   Total Protein 7.3 6.1 - 8.1 g/dL   Albumin 4.3 3.6 - 5.1 g/dL   Globulin 3.0 1.9 - 3.7 g/dL (calc)   AG Ratio 1.4 1.0 - 2.5 (calc)   Total Bilirubin 0.4 0.2 - 1.2 mg/dL   Alkaline phosphatase (APISO) 48 35 - 144 U/L   AST 16 10 - 35 U/L   ALT 13 9 - 46 U/L  PSA   Collection Time: 01/09/24  9:35 AM  Result Value Ref Range   PSA 1.54 < OR = 4.00 ng/mL  CBC with Differential/Platelet   Collection Time: 01/09/24  9:35 AM  Result Value Ref Range   WBC 5.8 3.8 - 10.8 Thousand/uL   RBC 4.18 (L) 4.20 - 5.80 Million/uL   Hemoglobin 13.1 (L) 13.2 - 17.1 g/dL   HCT 59.2 60.5 - 48.8 %   MCV 97.4 81.4 - 101.7 fL   MCH 31.3 27.0 - 33.0 pg   MCHC 32.2 31.6 - 35.4 g/dL   RDW 86.6 88.9 - 84.9 %   Platelets 241 140 - 400 Thousand/uL   MPV 9.2 7.5 - 12.5 fL   Neutro Abs 3,283 1,500 - 7,800 cells/uL   Absolute Lymphocytes 1,560 850 - 3,900 cells/uL   Absolute Monocytes 580 200 - 950 cells/uL   Eosinophils Absolute 319 15 - 500 cells/uL   Basophils Absolute 58 0 - 200 cells/uL   Neutrophils Relative % 56.6 %   Total Lymphocyte 26.9 %   Monocytes Relative 10.0 %   Eosinophils Relative 5.5 %   Basophils Relative 1.0 %  Hemoglobin A1c   Collection Time: 01/09/24  9:35 AM  Result Value Ref Range   Hgb A1c MFr Bld 6.2 (H) <5.7 %   Mean Plasma Glucose 131 mg/dL   eAG (mmol/L) 7.3 mmol/L  Lipid panel   Collection Time: 01/09/24  9:35 AM  Result Value Ref Range   Cholesterol 135 <200 mg/dL   HDL 50 > OR = 40 mg/dL   Triglycerides 89 <849 mg/dL   LDL Cholesterol (Calc) 68 mg/dL (calc)   Total CHOL/HDL Ratio 2.7 <5.0 (calc)   Non-HDL Cholesterol (Calc) 85 <869 mg/dL (calc)      Assessment & Plan:    Problem List Items Addressed This Visit     Anxiety   Relevant Medications   sertraline  (ZOLOFT ) 50 MG tablet   traZODone  (DESYREL ) 50 MG tablet   CAD (coronary artery disease)   Relevant Medications   atenolol  (TENORMIN ) 25 MG tablet   atorvastatin  (LIPITOR ) 80 MG tablet   sildenafil  (VIAGRA ) 50 MG tablet   furosemide  (LASIX ) 80 MG tablet   Chronic diastolic CHF (congestive heart failure) (HCC)   Relevant Medications   atenolol  (TENORMIN ) 25 MG tablet   atorvastatin  (LIPITOR ) 80 MG tablet   sildenafil  (VIAGRA ) 50 MG tablet   furosemide  (LASIX ) 80 MG tablet   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 (HCC)   Relevant Medications   atenolol  (TENORMIN ) 25 MG tablet   atorvastatin  (LIPITOR ) 80 MG tablet   sildenafil  (VIAGRA ) 50 MG tablet   furosemide  (LASIX ) 80 MG tablet   History of cerebrovascular accident (CVA) with residual deficit   Hypothyroidism   Relevant Medications   atenolol  (TENORMIN ) 25 MG tablet   levothyroxine  (SYNTHROID ) 125 MCG tablet   ILD (interstitial lung disease) (HCC)   Mixed hyperlipidemia   Relevant Medications   atenolol  (TENORMIN ) 25 MG tablet   atorvastatin  (LIPITOR ) 80 MG tablet   sildenafil  (VIAGRA ) 50 MG tablet   furosemide  (LASIX ) 80 MG tablet   OSA on CPAP   Recurrent major depressive disorder, in partial remission   Relevant Medications   sertraline  (ZOLOFT ) 50 MG tablet   traZODone  (DESYREL ) 50 MG tablet   Other Visit Diagnoses       Annual physical exam    -  Primary     Essential hypertension       Relevant Medications   atenolol  (TENORMIN ) 25  MG tablet   atorvastatin  (LIPITOR ) 80 MG tablet   sildenafil  (VIAGRA ) 50 MG tablet   furosemide  (LASIX ) 80 MG tablet     Psychophysiologic insomnia       Relevant Medications   traZODone  (DESYREL ) 50 MG tablet     Erectile dysfunction, unspecified erectile dysfunction type       Relevant Medications   sildenafil  (VIAGRA ) 50 MG tablet     Seasonal allergic  rhinitis due to other allergic trigger       Relevant Medications   montelukast  (SINGULAIR ) 10 MG tablet        Updated Health Maintenance information Reviewed recent lab results with patient Encouraged improvement to lifestyle with diet and exercise Goal of weight loss   Major depressive disorder and generalized anxiety disorder Mood issues persist, considering medication switch to sertraline  for better symptom management. Discussed medication transition and importance of family support and therapy. - Taper off escitalopram  by taking half a pill for one week. - Switch to sertraline  50 mg daily for 30 days, then increase to 100 mg daily if indicated - family/patient will contact us  for dose increase, may warrant dose up to 150 to 200mg  as previously this was effective - Next consider augmentation of therapy - Recommend therapist counseling as next option for supporting mental health, he can pursue [facility] for counseling initially may warrant referral in future  Balance disorder with history of falls Knee instability and balance issues persist. Physical therapy ongoing. - Continue physical therapy focusing on balance improvement.  Erectile dysfunction Pharmacy denied sildenafil  due to nitrate interaction. No recent nitrate use reported. - Ordered sildenafil  as needed, with note to pharmacist regarding patient is not taking nitrate in conjunction with Sildenafil   Acquired hypothyroidism Thyroid  function tests show T4 in a good range. Current levothyroxine  dose effective. - Continue current dose of levothyroxine .  Mixed hyperlipidemia Cholesterol levels well-controlled with LDL at 68 mg/dL.  History of cerebrovascular accident with residual deficit,  hemiplegia R side Continue exercises and maintain mobility and function Has assistance in the home  OSA on CPAP ILD Followed by Pulmonology, has had testing and further management for chronic respiratory conditions On  Wixella inhaler Further diagnostic   General Health Maintenance Lab results reviewed. Cholesterol, A1c, hemoglobin, PSA, kidney, liver, and thyroid  functions are normal or well-controlled. - Continue current management and monitoring of lab results.      route to [place]  to get Social Work for therapy ***   No orders of the defined types were placed in this encounter.   Meds ordered this encounter  Medications   sertraline  (ZOLOFT ) 50 MG tablet    Sig: Take 1 tablet (50 mg total) by mouth daily.    Dispense:  30 tablet    Refill:  0    For 30 days then will increase to 100mg  after 30 days   atenolol  (TENORMIN ) 25 MG tablet    Sig: Take 1 tablet (25 mg total) by mouth every morning.    Dispense:  90 tablet    Refill:  1   atorvastatin  (LIPITOR ) 80 MG tablet    Sig: Take 1 tablet (80 mg total) by mouth at bedtime.    Dispense:  90 tablet    Refill:  1   sildenafil  (VIAGRA ) 50 MG tablet    Sig: Take 1 tablet (50 mg total) by mouth daily as needed for erectile dysfunction.    Dispense:  30 tablet    Refill:  2  He is medically cleared to take Sildenafil . He does not take Nitrate / Nitroglycerin  medication.   levothyroxine  (SYNTHROID ) 125 MCG tablet    Sig: Take 1 tablet (125 mcg total) by mouth daily before breakfast.    Dispense:  90 tablet    Refill:  1   traZODone  (DESYREL ) 50 MG tablet    Sig: Take 1 tablet (50 mg total) by mouth at bedtime.    Dispense:  90 tablet    Refill:  1    Adherence pill pack   montelukast  (SINGULAIR ) 10 MG tablet    Sig: Take 1 tablet (10 mg total) by mouth at bedtime.    Dispense:  90 tablet    Refill:  1   furosemide  (LASIX ) 80 MG tablet    Sig: Take 1 tablet (80 mg total) by mouth daily. May take 2nd dose AS NEEDED for fluid retention.    Dispense:  90 tablet    Refill:  1     Follow up plan: Return in about 3 months (around 04/16/2024) for 3 month follow-up Mood / Med updates.  Marsa Officer, DO [facility] 01/17/2024, 2:13 PM  "

## 2024-01-17 NOTE — Patient Instructions (Addendum)
 Thank you for coming to the office today.  Escitalopram  10mg  take half tab = 5mg  daily for 1 week Switch to Sertraline  50mg  daily for 30 days, we will likely plan to double dose from 50mg  up to 100mg  after 30 days and we can keep adjusting going forward  Call us  to request dose inc to 100mg   Keep with working with [facility]UR[facility] team [place]  and Social Workers as well.  Ordered Sildenafil  50mg  daily as needed for erectile dysfunction if needed once per day max only.   Use www.goodrx.com for discounts or coupons  Keep up with Pulmonology  Please schedule a Follow-up Appointment to: No follow-ups on file.  If you have any other questions or concerns, please feel free to call the office or send a message through MyChart. You may also schedule an earlier appointment if necessary.  Additionally, you may be receiving a survey about your experience at our office within a few days to 1 week by e-mail or mail. We value your feedback.  Marsa Officer, DO [facility], [place]

## 2024-01-17 NOTE — Progress Notes (Unsigned)
 "  Subjective:    Patient ID: Luke Mckee, male    DOB: 1940/01/15, 84 y.o.   MRN: [id number]  Luke Mckee is a 84 y.o. male presenting on 01/17/2024 for Annual Exam   HPI  Discussed the use of AI scribe software for clinical note transcription with the patient, who gave verbal consent to proceed.  History of Present Illness   *** He had issue with    Hypertension and diuretic therapy Chronic Diastolic Heart Failure - Currently taking atenolol  and furosemide  80 mg twice daily. - Experiencing inconvenience from frequent urination, particularly during church services, and interested in reducing furosemide  dose. Often he has not had fluid retention or abnormal weight gain feels better without taking this med. - Caregiver confirms compliance with atenolol . - No chest pain, shortness of breath, or palpitations.   Coronary artery disease and antiplatelet therapy - Currently taking baby aspirin , atorvastatin , and clopidogrel . - Caregiver confirms compliance with aspirin  and clopidogrel . - Cholesterol is well controlled on current regimen.   Electrolyte and renal function - Recent laboratory results show normal kidney and liver function. - Sodium and potassium levels have improved.   Hematologic status - Hemoglobin is 11.9, stable for several months.   Glycemic control - Blood glucose levels consistently range from 110 to 118 mg/dL. - Most recent hemoglobin A1c is 6.1, the best result in four years.   Weight and nutritional status - Recent weight gain of approximately 10 pounds, current weight 223 pounds (previous low 213 pounds). - Caregiver describes a well-rounded diet, making dietary deficiency unlikely.   Psychiatric symptoms and medication use - Currently taking Lexapro , with consideration of dose reduction due to less agitation when doses are missed. - Lorazepam  used as needed for sleep and anxiety, but not used frequently.   Use of as-needed medications - Colchicine   used only once since last hospitalization. - Meclizine  not used for a long time.     History of CVA with residual deficit R sided weakness / multiple L MCA / TIA history HYPERTENSION Hyperlipidemia CAD s/p CABG with stable angina on nitrate  Followed by Neuro/Cards, see prior notes for background information. S/p loop recorder. No identified AFib. Denies any chest pain, new focal weakness, dyspnea, worsening edema, near syncope or syncope   Elevated A1c / PreDM   Hypothyroidism Chronic problem Last lab now 12/2022 showed normal Thyroid  panel and T4 He is taking Levothyroxine  125mcg daily, currently doing well - with some improvement after med adjustment of changing dosing so does not take other meds with levothyroxine .   Psychological Insomnia Failed Ambien Improved on Trazodone  nightly.   OSA on CPAP Followed by Pulmonology, no repeat sleep study - Today reports that sleep apnea is well controlled. He uses the CPAP machine every night. Tolerates the machine well, and thinks that sleeps better with it and feels good. No new concerns or symptoms.  ILD ***Stable on [place] Monitoring pulmonary nodules.  ***Falls recurrent PT  Health Maintenance: ***     01/11/2024   11:03 AM 11/04/2023    4:08 PM 08/23/2023    3:44 PM  Depression screen PHQ 2/9  Decreased Interest 1 1 0  Down, Depressed, Hopeless 1 1 0  PHQ - 2 Score 2 2 0  Altered sleeping 1 0   Tired, decreased energy 1 1   Change in appetite 0 0   Feeling bad or failure about yourself  0 0   Trouble concentrating 0 0   Moving slowly or fidgety/restless 0 0  Suicidal thoughts 0 0   PHQ-9 Score 4 3    Difficult doing work/chores  Not difficult at all      Data saved with a previous flowsheet row definition       01/11/2024   11:05 AM 07/11/2023    2:45 PM 05/10/2023    3:00 PM 03/25/2023    2:08 PM  GAD 7 : Generalized Anxiety Score  Nervous, Anxious, on Edge 1 0 1 1  Control/stop worrying 1 0 1   Worry  too much - different things 1 1 0 1  Trouble relaxing 1 0 0   Restless 1 2 0   Easily annoyed or irritable 1 1 1    Afraid - awful might happen 1 0 1   Total GAD 7 Score 7 4 4    Anxiety Difficulty Somewhat difficult Somewhat difficult Somewhat difficult      Past Medical History:  Diagnosis Date   Anxiety    Arthritis    CAD (coronary artery disease) 2005   s/p CABG, DES   CHF (congestive heart failure) (HCC)    in EPIC care everywhere   Chronic kidney disease    COPD (chronic obstructive pulmonary disease) (HCC)    in EPIC care everywhere   Depression    Heart murmur    History of stroke    Hypothyroidism    OSA (obstructive sleep apnea)    Paroxysmal A-fib (HCC)    in EPIC careeverywhere   Past Surgical History:  Procedure Laterality Date   APPENDECTOMY     BUNIONECTOMY     CATARACT EXTRACTION     Right eye   CATARACT EXTRACTION W/PHACO Left 01/19/2021   Procedure: CATARACT EXTRACTION PHACO AND INTRAOCULAR LENS PLACEMENT (IOC) LEFT 3.09 00:28.3;  Surgeon: Myrna Adine Anes, MD;  Location: [facility];  Service: Ophthalmology;  Laterality: Left;   COLONOSCOPY  07/06/2004   CORONARY ARTERY BYPASS GRAFT  2005   CORONARY BALLOON ANGIOPLASTY N/A 05/19/2023   Procedure: CORONARY BALLOON ANGIOPLASTY;  Surgeon: Anner Alm ORN, MD;  Location: [facility] INVASIVE CV LAB;  Service: Cardiovascular;  Laterality: N/A;   LOOP RECORDER INSERTION N/A 07/25/2018   Procedure: LOOP RECORDER INSERTION;  Surgeon: Inocencio Soyla Lunger, MD;  Location: [facility] INVAS[facility] CV [facility];  Service: Cardiovascular;  Laterality: N/A;   RIGHT/LEFT HEART CATH AND CORONARY ANGIOGRAPHY Bilateral 05/19/2023   Procedure: RIGHT/LEFT HEART CATH AND CORONARY ANGIOGRAPHY;  Surgeon: Anner Alm ORN, MD;  Location: [facility] INVASIVE CV LAB;  Service: Cardiovascular;  Laterality: Bilateral;   TOTAL HIP ARTHROPLASTY Right 11/23/2011   TOTAL HIP ARTHROPLASTY Left 09/07/2011   TOTAL KNEE ARTHROPLASTY Right 02/15/2018   VASECTOMY   1978   Social History   Socioeconomic History   Marital status: Married    Spouse name: Not on file   Number of children: Not on file   Years of education: Not on file   Highest education level: Not on file  Occupational History   Occupation: retired  Tobacco Use   Smoking status: Never   Smokeless tobacco: Never  Vaping Use   Vaping status: Never Used  Substance and Sexual Activity   Alcohol use: Not Currently    Comment: past   Drug use: Never   Sexual activity: Not on file  Other Topics Concern   Not on file  Social History Narrative   Not on file   Social Drivers of Health   Tobacco Use: Low Risk (01/17/2024)   Patient History    Smoking Tobacco Use:  Never    Smokeless Tobacco Use: Never    Passive Exposure: Not on file  Financial Resource Strain: Low Risk (11/04/2023)   Overall Financial Resource Strain (CARDIA)    Difficulty of Paying Living Expenses: Not hard at all  Food Insecurity: No Food Insecurity (11/04/2023)   Epic    Worried About Programme Researcher, Broadcasting/film/video in the Last Year: Never true    Ran Out of Food in the Last Year: Never true  Transportation Needs: No Transportation Needs (11/04/2023)   Epic    Lack of Transportation (Medical): No    Lack of Transportation (Non-Medical): No  Physical Activity: Insufficiently Active (11/04/2023)   Exercise Vital Sign    Days of Exercise per Week: 3 days    Minutes of Exercise per Session: 30 min  Stress: No Stress Concern Present (11/04/2023)   [employer] of Occupational Health - Occupational Stress Questionnaire    Feeling of Stress: Not at all  Social Connections: Moderately Integrated (11/04/2023)   Social Connection and Isolation Panel    Frequency of Communication with Friends and Family: More than three times a week    Frequency of Social Gatherings with Friends and Family: Three times a week    Attends Religious Services: More than 4 times per year    Active Member of Clubs or Organizations: Patient  declined    Attends Banker Meetings: Never    Marital Status: Married  Catering Manager Violence: Not At Risk (11/04/2023)   Epic    Fear of Current or Ex-Partner: No    Emotionally Abused: No    Physically Abused: No    Sexually Abused: No  Depression (PHQ2-9): Low Risk (01/11/2024)   Depression (PHQ2-9)    PHQ-2 Score: 4  Alcohol Screen: Low Risk (11/04/2023)   Alcohol Screen    Last Alcohol Screening Score (AUDIT): 0  Housing: Unknown (11/04/2023)   Epic    Unable to Pay for Housing in the Last Year: No    Number of Times Moved in the Last Year: Not on file    Homeless in the Last Year: No  Utilities: Not At Risk (11/04/2023)   Epic    Threatened with loss of utilities: No  Health Literacy: Adequate Health Literacy (11/04/2023)   B1300 Health Literacy    Frequency of need for help with medical instructions: Never   Family History  Problem Relation Age of Onset   Anxiety disorder Sister    Current Outpatient Medications on File Prior to Visit  Medication Sig   acetaminophen  (TYLENOL ) 650 MG CR tablet Take 1,300 mg by mouth at bedtime as needed for pain.   albuterol  (VENTOLIN  HFA) 108 (90 Base) MCG/ACT inhaler Inhale 1-2 puffs into the lungs every 4 (four) hours as needed for wheezing or shortness of breath (cough).   aspirin  EC 81 MG tablet Take 81 mg by mouth daily.   atenolol  (TENORMIN ) 25 MG tablet Take 1 tablet (25 mg total) by mouth every morning.   atorvastatin  (LIPITOR ) 80 MG tablet Take 1 tablet (80 mg total) by mouth at bedtime.   Calcium  Carb-Cholecalciferol (CALCIUM  600 + D PO) Take 1 tablet by mouth daily.   Cholecalciferol (VITAMIN D) 50 MCG (2000 UT) tablet Take 2,000 Units by mouth daily.   clopidogrel  (PLAVIX ) 75 MG tablet Take 1 tablet (75 mg total) by mouth daily.   clotrimazole -betamethasone  (LOTRISONE ) cream Apply twice a day for 1-2 weeks, may repeat if need in future   Coenzyme Q10 100 MG  TABS Take 100 mg by mouth daily.   colchicine   0.6 MG tablet For acute gout flare take 2 pills at once, then take a 3rd pill 2 hours later. Then take 1 pill daily for 7-10 days or until resolved.   diclofenac sodium (VOLTAREN) 1 % GEL Apply 2 g topically 4 (four) times daily as needed for pain. Foot pain   escitalopram  (LEXAPRO ) 10 MG tablet Take 1 tablet (10 mg total) by mouth daily.   fluticasone -salmeterol (WIXELA INHUB) 100-50 MCG/ACT AEPB Inhale 1 puff into the lungs 2 (two) times daily.   furosemide  (LASIX ) 80 MG tablet Take 1 tablet (80 mg total) by mouth daily. May take 2nd dose AS NEEDED for fluid retention.   ipratropium (ATROVENT ) 0.06 % nasal spray Place 2 sprays into both nostrils 4 (four) times daily as needed for rhinitis.   levothyroxine  (SYNTHROID ) 125 MCG tablet Take 1 tablet (125 mcg total) by mouth daily before breakfast.   loratadine  (CLARITIN ) 10 MG tablet Take 10 mg by mouth daily.   meclizine  (ANTIVERT ) 12.5 MG tablet Take 1 tablet (12.5 mg total) by mouth 3 (three) times daily as needed for dizziness.   montelukast  (SINGULAIR ) 10 MG tablet Take 1 tablet (10 mg total) by mouth at bedtime.   Multiple Vitamin (MULTIVITAMIN) tablet Take 1 tablet by mouth daily.   nitroGLYCERIN  (NITROSTAT ) 0.4 MG SL tablet Place 1 tablet (0.4 mg total) under the tongue every 5 (five) minutes as needed for chest pain.   PATADAY 0.1 % ophthalmic solution Place 1 drop into both eyes daily as needed for allergies.   sennosides-docusate sodium  (SENOKOT-S) 8.6-50 MG tablet Take 2 tablets by mouth daily.   traZODone  (DESYREL ) 50 MG tablet Take 1 tablet (50 mg total) by mouth at bedtime.   No current facility-administered medications on file prior to visit.    Review of Systems Per HPI unless specifically indicated above     Objective:    BP 130/82 (BP Location: Right Arm, Patient Position: Sitting, Cuff Size: Normal)   Pulse 69   Ht 5' 8 (1.727 m)   Wt 227 lb 4 oz (103.1 kg)   SpO2 95%   BMI 34.55 kg/m   Wt Readings from Last 3  Encounters:  01/17/24 227 lb 4 oz (103.1 kg)  01/11/24 221 lb (100.2 kg)  11/07/23 228 lb (103.4 kg)    Physical Exam  Results for orders placed or performed in visit on 01/10/24  T4, free   Collection Time: 01/09/24  9:35 AM  Result Value Ref Range   Free T4 1.6 0.8 - 1.8 ng/dL  Comprehensive metabolic panel with GFR   Collection Time: 01/09/24  9:35 AM  Result Value Ref Range   Glucose, Bld 118 (H) 65 - 99 mg/dL   BUN 25 7 - 25 mg/dL   Creat 9.04 9.29 - 8.77 mg/dL   eGFR 80 > OR = 60 fO/fpw/8.26f7   BUN/Creatinine Ratio SEE NOTE: 6 - 22 (calc)   Sodium 142 135 - 146 mmol/L   Potassium 4.2 3.5 - 5.3 mmol/L   Chloride 102 98 - 110 mmol/L   CO2 31 20 - 32 mmol/L   Calcium  9.5 8.6 - 10.3 mg/dL   Total Protein 7.3 6.1 - 8.1 g/dL   Albumin 4.3 3.6 - 5.1 g/dL   Globulin 3.0 1.9 - 3.7 g/dL (calc)   AG Ratio 1.4 1.0 - 2.5 (calc)   Total Bilirubin 0.4 0.2 - 1.2 mg/dL   Alkaline phosphatase (APISO) 48 35 -  144 U/L   AST 16 10 - 35 U/L   ALT 13 9 - 46 U/L  PSA   Collection Time: 01/09/24  9:35 AM  Result Value Ref Range   PSA 1.54 < OR = 4.00 ng/mL  CBC with Differential/Platelet   Collection Time: 01/09/24  9:35 AM  Result Value Ref Range   WBC 5.8 3.8 - 10.8 Thousand/uL   RBC 4.18 (L) 4.20 - 5.80 Million/uL   Hemoglobin 13.1 (L) 13.2 - 17.1 g/dL   HCT 59.2 60.5 - 48.8 %   MCV 97.4 81.4 - 101.7 fL   MCH 31.3 27.0 - 33.0 pg   MCHC 32.2 31.6 - 35.4 g/dL   RDW 86.6 88.9 - 84.9 %   Platelets 241 140 - 400 Thousand/uL   MPV 9.2 7.5 - 12.5 fL   Neutro Abs 3,283 1,500 - 7,800 cells/uL   Absolute Lymphocytes 1,560 850 - 3,900 cells/uL   Absolute Monocytes 580 200 - 950 cells/uL   Eosinophils Absolute 319 15 - 500 cells/uL   Basophils Absolute 58 0 - 200 cells/uL   Neutrophils Relative % 56.6 %   Total Lymphocyte 26.9 %   Monocytes Relative 10.0 %   Eosinophils Relative 5.5 %   Basophils Relative 1.0 %  Hemoglobin A1c   Collection Time: 01/09/24  9:35 AM  Result Value Ref  Range   Hgb A1c MFr Bld 6.2 (H) <5.7 %   Mean Plasma Glucose 131 mg/dL   eAG (mmol/L) 7.3 mmol/L  Lipid panel   Collection Time: 01/09/24  9:35 AM  Result Value Ref Range   Cholesterol 135 <200 mg/dL   HDL 50 > OR = 40 mg/dL   Triglycerides 89 <849 mg/dL   LDL Cholesterol (Calc) 68 mg/dL (calc)   Total CHOL/HDL Ratio 2.7 <5.0 (calc)   Non-HDL Cholesterol (Calc) 85 <869 mg/dL (calc)      Assessment & Plan:   Problem List Items Addressed This Visit   None    Updated Health Maintenance information ***- Reviewed recent lab results with patient Encouraged improvement to lifestyle with diet and exercise -*** Goal of weight loss  Assessment and Plan Assessment & Plan   ***route to [place]  to get Social Work for therapy ***   No orders of the defined types were placed in this encounter.   No orders of the defined types were placed in this encounter.    Follow up plan: No follow-ups on file.  Marsa Officer, DO [facility] 01/17/2024, 2:13 PM  "

## 2024-01-25 ENCOUNTER — Ambulatory Visit: Admitting: Podiatry

## 2024-01-25 DIAGNOSIS — B351 Tinea unguium: Secondary | ICD-10-CM | POA: Diagnosis not present

## 2024-01-25 DIAGNOSIS — M79674 Pain in right toe(s): Secondary | ICD-10-CM | POA: Diagnosis not present

## 2024-01-25 DIAGNOSIS — M79675 Pain in left toe(s): Secondary | ICD-10-CM | POA: Diagnosis not present

## 2024-01-25 NOTE — Progress Notes (Signed)
 "  Subjective:  Patient ID: Luke Mckee, male    DOB: 02-05-1940,  MRN: [id number] HPI Chief Complaint  Patient presents with   Nail Problem    Thick painful toenails, re-establish care     84 y.o. male presents with the above complaint.   ROS: Denies fever chills nausea vomiting muscle aches pains calf pain back pain chest pain shortness of breath.  Past Medical History:  Diagnosis Date   Anxiety    Arthritis    CAD (coronary artery disease) 2005   s/p CABG, DES   CHF (congestive heart failure) (HCC)    in EPIC care everywhere   Chronic kidney disease    COPD (chronic obstructive pulmonary disease) (HCC)    in EPIC care everywhere   Depression    Heart murmur    History of stroke    Hypothyroidism    OSA (obstructive sleep apnea)    Paroxysmal A-fib (HCC)    in EPIC careeverywhere   Past Surgical History:  Procedure Laterality Date   APPENDECTOMY     BUNIONECTOMY     CATARACT EXTRACTION     Right eye   CATARACT EXTRACTION W/PHACO Left 01/19/2021   Procedure: CATARACT EXTRACTION PHACO AND INTRAOCULAR LENS PLACEMENT (IOC) LEFT 3.09 00:28.3;  Surgeon: Myrna Adine Anes, MD;  Location: [facility];  Service: Ophthalmology;  Laterality: Left;   COLONOSCOPY  07/06/2004   CORONARY ARTERY BYPASS GRAFT  2005   CORONARY BALLOON ANGIOPLASTY N/A 05/19/2023   Procedure: CORONARY BALLOON ANGIOPLASTY;  Surgeon: Anner Alm ORN, MD;  Location: [facility] INVASIVE CV LAB;  Service: Cardiovascular;  Laterality: N/A;   LOOP RECORDER INSERTION N/A 07/25/2018   Procedure: LOOP RECORDER INSERTION;  Surgeon: Inocencio Soyla Lunger, MD;  Location: [facility];  Service: Cardiovascular;  Laterality: N/A;   RIGHT/LEFT HEART CATH AND CORONARY ANGIOGRAPHY Bilateral 05/19/2023   Procedure: RIGHT/LEFT HEART CATH AND CORONARY ANGIOGRAPHY;  Surgeon: Anner Alm ORN, MD;  Location: [facility];  Service: Cardiovascular;  Laterality: Bilateral;   TOTAL HIP ARTHROPLASTY Right 11/23/2011    TOTAL HIP ARTHROPLASTY Left 09/07/2011   TOTAL KNEE ARTHROPLASTY Right 02/15/2018   VASECTOMY  1978   Current Medications[1]  Allergies[2] Review of Systems Objective:  There were no vitals filed for this visit.  General: Well developed, nourished, in no acute distress, alert and oriented x3   Dermatological: Skin is warm, dry and supple bilateral. Nails x 10 are well maintained though they are thick yellow dystrophic clinically mycotic with subungual debris and painful.; remaining integument appears unremarkable at this time. There are no open sores, no preulcerative lesions, no rash or signs of infection present.  Vascular: Dorsalis Pedis artery and Posterior Tibial artery pedal pulses are 2/4 bilateral with immedate capillary fill time. Pedal hair growth present. No varicosities and no lower extremity edema present bilateral.   Neruologic: Grossly intact via light touch bilateral. Vibratory intact via tuning fork bilateral. Protective threshold with Semmes Wienstein monofilament intact to all pedal sites bilateral. Patellar and Achilles deep tendon reflexes 2+ bilateral. No Babinski or clonus noted bilateral.   Musculoskeletal: No gross boney pedal deformities bilateral. No pain, crepitus, or limitation noted with foot and ankle range of motion bilateral. Muscular strength 5/5 in all groups tested bilateral.  Gait: Unassisted, Nonantalgic.    Radiographs:  None taken  Assessment & Plan:   Assessment: Pain in limb secondary to onychomycosis  Plan: Debridement of toenails 1 through 5 bilateral.     [name] T. [place],  DPM    [1]  Current Outpatient Medications:    acetaminophen  (TYLENOL ) 650 MG CR tablet, Take 1,300 mg by mouth at bedtime as needed for pain., Disp: , Rfl:    albuterol  (VENTOLIN  HFA) 108 (90 Base) MCG/ACT inhaler, Inhale 1-2 puffs into the lungs every 4 (four) hours as needed for wheezing or shortness of breath (cough)., Disp: 1 each, Rfl: 3   aspirin  EC 81 MG  tablet, Take 81 mg by mouth daily., Disp: , Rfl:    atenolol  (TENORMIN ) 25 MG tablet, Take 1 tablet (25 mg total) by mouth every morning., Disp: 90 tablet, Rfl: 1   atorvastatin  (LIPITOR ) 80 MG tablet, Take 1 tablet (80 mg total) by mouth at bedtime., Disp: 90 tablet, Rfl: 1   Calcium  Carb-Cholecalciferol (CALCIUM  600 + D PO), Take 1 tablet by mouth daily., Disp: , Rfl:    Cholecalciferol (VITAMIN D) 50 MCG (2000 UT) tablet, Take 2,000 Units by mouth daily., Disp: , Rfl:    clopidogrel  (PLAVIX ) 75 MG tablet, Take 1 tablet (75 mg total) by mouth daily., Disp: 90 tablet, Rfl: 3   clotrimazole -betamethasone  (LOTRISONE ) cream, Apply twice a day for 1-2 weeks, may repeat if need in future, Disp: 30 g, Rfl: 1   Coenzyme Q10 100 MG TABS, Take 100 mg by mouth daily., Disp: , Rfl:    colchicine  0.6 MG tablet, For acute gout flare take 2 pills at once, then take a 3rd pill 2 hours later. Then take 1 pill daily for 7-10 days or until resolved., Disp: 30 tablet, Rfl: 2   diclofenac sodium (VOLTAREN) 1 % GEL, Apply 2 g topically 4 (four) times daily as needed for pain. Foot pain, Disp: , Rfl:    fluticasone -salmeterol (WIXELA INHUB) 100-50 MCG/ACT AEPB, Inhale 1 puff into the lungs 2 (two) times daily., Disp: 60 each, Rfl: 12   furosemide  (LASIX ) 80 MG tablet, Take 1 tablet (80 mg total) by mouth daily. May take 2nd dose AS NEEDED for fluid retention., Disp: 90 tablet, Rfl: 1   ipratropium (ATROVENT ) 0.06 % nasal spray, Place 2 sprays into both nostrils 4 (four) times daily as needed for rhinitis., Disp: 15 mL, Rfl: 2   levothyroxine  (SYNTHROID ) 125 MCG tablet, Take 1 tablet (125 mcg total) by mouth daily before breakfast., Disp: 90 tablet, Rfl: 1   loratadine  (CLARITIN ) 10 MG tablet, Take 10 mg by mouth daily., Disp: , Rfl:    meclizine  (ANTIVERT ) 12.5 MG tablet, Take 1 tablet (12.5 mg total) by mouth 3 (three) times daily as needed for dizziness., Disp: 30 tablet, Rfl: 1   montelukast  (SINGULAIR ) 10 MG tablet,  Take 1 tablet (10 mg total) by mouth at bedtime., Disp: 90 tablet, Rfl: 1   Multiple Vitamin (MULTIVITAMIN) tablet, Take 1 tablet by mouth daily., Disp: , Rfl:    PATADAY 0.1 % ophthalmic solution, Place 1 drop into both eyes daily as needed for allergies., Disp: , Rfl:    sennosides-docusate sodium  (SENOKOT-S) 8.6-50 MG tablet, Take 2 tablets by mouth daily., Disp: , Rfl:    sertraline  (ZOLOFT ) 50 MG tablet, Take 1 tablet (50 mg total) by mouth daily., Disp: 30 tablet, Rfl: 0   sildenafil  (VIAGRA ) 50 MG tablet, Take 1 tablet (50 mg total) by mouth daily as needed for erectile dysfunction., Disp: 30 tablet, Rfl: 2   traZODone  (DESYREL ) 50 MG tablet, Take 1 tablet (50 mg total) by mouth at bedtime., Disp: 90 tablet, Rfl: 1 [2]  Allergies Allergen Reactions   Bee Pollen Cough  Pollen Extract Cough   Ace Inhibitors    Cephalexin     Vomiting and diarrhea   Crestor [Rosuvastatin Calcium ] Other (See Comments)    myalgia   Tape Rash    blistering   "

## 2024-01-26 ENCOUNTER — Other Ambulatory Visit: Payer: Self-pay

## 2024-01-26 ENCOUNTER — Telehealth: Admitting: *Deleted

## 2024-01-26 NOTE — Patient Outreach (Signed)
 Complex Care Management   Visit Note  01/26/2024  Name:  Luke Mckee MRN: [id number] DOB: Mar 04, 1940  Situation:  Referral received for Complex Care Management related to Mental/Behavioral Health diagnosis related to caregiving stress. I obtained verbal consent from Patient. Patient was not available during the initial scheduled time but was available to talk at a later time the same day.  Visit completed with Patient  on the phone.  Background:   Past Medical History:  Diagnosis Date   Anxiety    Arthritis    CAD (coronary artery disease) 2005   s/p CABG, DES   CHF (congestive heart failure) (HCC)    in EPIC care everywhere   Chronic kidney disease    COPD (chronic obstructive pulmonary disease) (HCC)    in EPIC care everywhere   Depression    Heart murmur    History of stroke    Hypothyroidism    OSA (obstructive sleep apnea)    Paroxysmal A-fib (HCC)    in EPIC careeverywhere    Assessment: Spoke with Patient, his spouse and daughter Alan by phone today to complete initial assessment. Patient was amenable to counseling services. LCSW will assist with connecting Patient with a therapist for ongoing counseling services. Patient Reported Symptoms:  Cognitive Cognitive Status: No symptoms reported Cognitive/Intellectual Conditions Management [RPT]: None reported or documented in medical history or problem list   Health Facilitated by: Rest, Prayer/meditation  Neurological Neurological Review of Symptoms: No symptoms reported    HEENT HEENT Symptoms Reported: No symptoms reported (dry eyes in rt eye use drops and its fine after that) HEENT Management Strategies: Medical device (one in chest -loop- battery stopped working -never had it removed - placed d/t heart attack and 2 minor strokes)  (weartwo hearing aides)  Cardiovascular Cardiovascular Symptoms Reported: No symptoms reported Does patient have uncontrolled Hypertension?: No (patient reports that it has been pretty  stable although low)    Respiratory Respiratory Symptoms Reported: No symptoms reported    Endocrine Endocrine Symptoms Reported: No symptoms reported Is patient diabetic?: No    Gastrointestinal Gastrointestinal Symptoms Reported: No symptoms reported      Genitourinary Genitourinary Symptoms Reported: No symptoms reported    Integumentary Integumentary Symptoms Reported: Bruising    Musculoskeletal Musculoskelatal Symptoms Reviewed: Other Additional Musculoskeletal Details: if my feet are hurting, im hurting but otherwise no        Psychosocial Psychosocial Symptoms Reported: Sadness - if selected complete PHQ 2-9, Irritability Additional Psychological Details: admits to some sadness and irritability at times Behavioral Management Strategies: Adequate rest, Medication therapy Major Change/Loss/Stressor/Fears (CP): Medical condition, family Techniques to [place] with Loss/Stress/Change: Diversional activities, Spiritual practice(s), Medication Quality of Family Relationships: helpful, involved, supportive Do you feel physically threatened by others?: No    01/26/2024    PHQ2-9 Depression Screening   Little interest or pleasure in doing things Several days  Feeling down, depressed, or hopeless Not at all  PHQ-2 - Total Score 1  Trouble falling or staying asleep, or sleeping too much Not at all (i sleep enough- doing ok - Cpap i use every night)  Feeling tired or having little energy Several days  Poor appetite or overeating  Not at all  Feeling bad about yourself - or that you are a failure or have let yourself or your family down Several days (when my wife gets on to me about not showing enough to her - then i go over and kiss her)  Trouble concentrating on things, such as reading  the newspaper or watching television Not at all  Moving or speaking so slowly that other people could have noticed.  Or the opposite - being so fidgety or restless that you have been moving around a lot  more than usual Not at all  Thoughts that you would be better off dead, or hurting yourself in some way Not at all  PHQ2-9 Total Score 3  If you checked off any problems, how difficult have these problems made it for you to do your work, take care of things at home, or get along with other people    Depression Interventions/Treatment      There were no vitals filed for this visit.    Medications Reviewed Today     Reviewed by Angelena Finis HERO, LCSW (Social Worker) on 01/26/24 at 1649  Med List Status: <None>   Medication Order Taking? Sig Documenting Provider Last Dose Status Informant  acetaminophen  (TYLENOL ) 650 MG CR tablet [id number] Yes Take 1,300 mg by mouth at bedtime as needed for pain. [provider]  Active Spouse/Significant Other  albuterol  (VENTOLIN  HFA) 108 (90 Base) MCG/ACT inhaler [id number] Yes Inhale 1-2 puffs into the lungs every 4 (four) hours as needed for wheezing or shortness of breath (cough). Edman Marsa PARAS, DO  Active   aspirin  EC 81 MG tablet [id number] Yes Take 81 mg by mouth daily. [provider]  Active Spouse/Significant Other  atenolol  (TENORMIN ) 25 MG tablet [id number] Yes Take 1 tablet (25 mg total) by mouth every morning. Edman Marsa PARAS, DO  Active   atorvastatin  (LIPITOR ) 80 MG tablet [id number] Yes Take 1 tablet (80 mg total) by mouth at bedtime. Edman Marsa PARAS, DO  Active   Calcium  Carb-Cholecalciferol (CALCIUM  600 + D PO) [id number] Yes Take 1 tablet by mouth daily. [provider]  Active Spouse/Significant Other  Cholecalciferol (VITAMIN D) 50 MCG (2000 UT) tablet [id number] Yes Take 2,000 Units by mouth daily. [provider]  Active Spouse/Significant Other  clopidogrel  (PLAVIX ) 75 MG tablet [id number] Yes Take 1 tablet (75 mg total) by mouth daily. Vivienne Lonni Ingle, NP  Active   clotrimazole -betamethasone  (LOTRISONE ) cream [id number] Yes Apply twice a day for 1-2 weeks, may repeat  if need in future Edman Marsa PARAS, DO  Active Spouse/Significant Other           Med Note MALKA LYNWOOD HERO [name] 19, 2023  1:17 PM) > 1 month since last used    Coenzyme Q10 100 MG TABS [id number] Yes Take 100 mg by mouth daily. [provider]  Active Spouse/Significant Other  colchicine  0.6 MG tablet [id number] Yes For acute gout flare take 2 pills at once, then take a 3rd pill 2 hours later. Then take 1 pill daily for 7-10 days or until resolved. Edman Marsa PARAS, DO  Active Spouse/Significant Other           Med Note MALKA LYNWOOD HERO [name] 19, 2023  1:18 PM) > 1 month since last used  diclofenac sodium (VOLTAREN) 1 % GEL [id number] Yes Apply 2 g topically 4 (four) times daily as needed for pain. Foot pain [provider]  Active Spouse/Significant Other           Med Note MALKA LYNWOOD HERO   Thu May 19, 2023  1:18 PM) > 1 month since last used  fluticasone -salmeterol (WIXELA INHUB) 100-50 MCG/ACT AEPB [id number] Yes Inhale 1 puff into the lungs 2 (two) times daily. Isadora Hose, MD  Active   furosemide  (LASIX ) 80 MG tablet [id number] Yes Take 1 tablet (80 mg total) by mouth daily. May take 2nd dose AS NEEDED for fluid retention. Karamalegos, Marsa PARAS, DO  Active   ipratropium (ATROVENT ) 0.06 % nasal spray [id number] Yes Place 2 sprays into both nostrils 4 (four) times daily as needed for rhinitis. Edman Marsa PARAS, DO  Active Spouse/Significant Other           Med Note MALKA LYNWOOD CHRISTELLA [name] 19, 2023  1:19 PM) > 1 month since last used  levothyroxine  (SYNTHROID ) 125 MCG tablet [id number] Yes Take 1 tablet (125 mcg total) by mouth daily before breakfast. Edman Marsa PARAS, DO  Active   loratadine  (CLARITIN ) 10 MG tablet [id number] Yes Take 10 mg by mouth daily. [provider]  Active Spouse/Significant Other  meclizine  (ANTIVERT ) 12.5 MG tablet [id number] Yes Take 1 tablet (12.5 mg total) by mouth 3 (three) times daily as needed  for dizziness. Edman Marsa PARAS, DO  Active Spouse/Significant Other           Med Note MALKA LYNWOOD CHRISTELLA   Thu May 19, 2023  1:21 PM) > 1 month since last used  montelukast  (SINGULAIR ) 10 MG tablet [id number] Yes Take 1 tablet (10 mg total) by mouth at bedtime. Edman Marsa PARAS, DO  Active   Multiple Vitamin (MULTIVITAMIN) tablet [id number] Yes Take 1 tablet by mouth daily. [provider]  Active Spouse/Significant Other  PATADAY 0.1 % ophthalmic solution [id number] Yes Place 1 drop into both eyes daily as needed for allergies. [provider]  Active Spouse/Significant Other  sennosides-docusate sodium  (SENOKOT-S) 8.6-50 MG tablet [id number]  Take 2 tablets by mouth daily.  Patient not taking: Reported on 01/26/2024   [provider]  Active Spouse/Significant Other  sertraline  (ZOLOFT ) 50 MG tablet [id number] Yes Take 1 tablet (50 mg total) by mouth daily. Edman Marsa PARAS, DO  Active   sildenafil  (VIAGRA ) 50 MG tablet [id number]  Take 1 tablet (50 mg total) by mouth daily as needed for erectile dysfunction. Edman Marsa PARAS, DO  Active   traZODone  (DESYREL ) 50 MG tablet [id number] Yes Take 1 tablet (50 mg total) by mouth at bedtime. Edman Marsa PARAS, DO  Active             Recommendation:   PCP Follow-up Specialty provider follow-up as needed Referral to: counseling services   Follow Up Plan:   Telephone follow up appointment date/time:  02/06/2024 at 1:00 pm.  Murray Shawl, LCSW [facility], [facility] Licensed Clinical Social Worker Direct Dial : [phone]

## 2024-02-01 NOTE — Patient Outreach (Incomplete)
 Complex Care Management   Visit Note  02/01/2024  Name:  Luke Mckee MRN: [id number] DOB: [birth year]  Situation: Referral received for Complex Care Management related to Mental/Behavioral Health diagnosis *** I obtained verbal consent from Patient.  Visit completed with Patient  on the phone  Background:   Past Medical History:  Diagnosis Date   Anxiety    Arthritis    CAD (coronary artery disease) 2005   s/p CABG, DES   CHF (congestive heart failure) (HCC)    in EPIC care everywhere   Chronic kidney disease    COPD (chronic obstructive pulmonary disease) (HCC)    in EPIC care everywhere   Depression    Heart murmur    History of stroke    Hypothyroidism    OSA (obstructive sleep apnea)    Paroxysmal A-fib (HCC)    in EPIC careeverywhere    Assessment: Patient Reported Symptoms:  Cognitive Cognitive Status: No symptoms reported Cognitive/Intellectual Conditions Management [RPT]: None reported or documented in medical history or problem list   Health Facilitated by: Rest, Prayer/meditation  Neurological Neurological Review of Symptoms: No symptoms reported    HEENT HEENT Symptoms Reported: No symptoms reported (dry eyes in rt eye use drops and its fine after that) HEENT Management Strategies: Medical device (one in chest -loop- battery stopped working -never had it removed - placed d/t heart attack and 2 minor strokes)  (weartwo hearing aides)  Cardiovascular Cardiovascular Symptoms Reported: No symptoms reported Does patient have uncontrolled Hypertension?: No (patient reports that it has been pretty stable although low)    Respiratory Respiratory Symptoms Reported: No symptoms reported    Endocrine Endocrine Symptoms Reported: No symptoms reported Is patient diabetic?: No    Gastrointestinal Gastrointestinal Symptoms Reported: No symptoms reported      Genitourinary Genitourinary Symptoms Reported: No symptoms reported    Integumentary Integumentary Symptoms  Reported: Bruising    Musculoskeletal Musculoskelatal Symptoms Reviewed: Other Additional Musculoskeletal Details: if my feet are hurting, im hurting but otherwise no        Psychosocial Psychosocial Symptoms Reported: Sadness - if selected complete PHQ 2-9, Irritability Additional Psychological Details: admits to some sadness and irritability at times Behavioral Management Strategies: Adequate rest, Medication therapy Major Change/Loss/Stressor/Fears (CP): Medical condition, family Techniques to [place] with Loss/Stress/Change: Diversional activities, Spiritual practice(s), Medication Quality of Family Relationships: helpful, involved, supportive Do you feel physically threatened by others?: No    02/01/2024    PHQ2-9 Depression Screening   Little interest or pleasure in doing things Several days  Feeling down, depressed, or hopeless Not at all  PHQ-2 - Total Score 1  Trouble falling or staying asleep, or sleeping too much Not at all (i sleep enough- doing ok - Cpap i use every night)  Feeling tired or having little energy Several days  Poor appetite or overeating  Not at all  Feeling bad about yourself - or that you are a failure or have let yourself or your family down Several days (when my wife gets on to me about not showing enough to her - then i go over and kiss her)  Trouble concentrating on things, such as reading the newspaper or watching television Not at all  Moving or speaking so slowly that other people could have noticed.  Or the opposite - being so fidgety or restless that you have been moving around a lot more than usual Not at all  Thoughts that you would be better off dead, or hurting yourself in some way  Not at all  PHQ2-9 Total Score 3  If you checked off any problems, how difficult have these problems made it for you to do your work, take care of things at home, or get along with other people    Depression Interventions/Treatment      There were no vitals filed  for this visit.    Medications Reviewed Today     Reviewed by Angelena Finis HERO, LCSW (Social Worker) on 01/26/24 at 1649  Med List Status: <None>   Medication Order Taking? Sig Documenting Provider Last Dose Status Informant  acetaminophen  (TYLENOL ) 650 MG CR tablet [id number] Yes Take 1,300 mg by mouth at bedtime as needed for pain. [provider]  Active Spouse/Significant Other  albuterol  (VENTOLIN  HFA) 108 (90 Base) MCG/ACT inhaler [id number] Yes Inhale 1-2 puffs into the lungs every 4 (four) hours as needed for wheezing or shortness of breath (cough). Edman Marsa PARAS, DO  Active   aspirin  EC 81 MG tablet [id number] Yes Take 81 mg by mouth daily. [provider]  Active Spouse/Significant Other  atenolol  (TENORMIN ) 25 MG tablet [id number] Yes Take 1 tablet (25 mg total) by mouth every morning. Edman Marsa PARAS, DO  Active   atorvastatin  (LIPITOR ) 80 MG tablet [id number] Yes Take 1 tablet (80 mg total) by mouth at bedtime. Edman Marsa PARAS, DO  Active   Calcium  Carb-Cholecalciferol (CALCIUM  600 + D PO) [id number] Yes Take 1 tablet by mouth daily. [provider]  Active Spouse/Significant Other  Cholecalciferol (VITAMIN D) 50 MCG (2000 UT) tablet [id number] Yes Take 2,000 Units by mouth daily. [provider]  Active Spouse/Significant Other  clopidogrel  (PLAVIX ) 75 MG tablet [id number] Yes Take 1 tablet (75 mg total) by mouth daily. Vivienne Lonni Ingle, NP  Active   clotrimazole -betamethasone  (LOTRISONE ) cream [id number] Yes Apply twice a day for 1-2 weeks, may repeat if need in future Edman Marsa PARAS, DO  Active Spouse/Significant Other           Med Note MALKA LYNWOOD HERO [name] 19, 2023  1:17 PM) > 1 month since last used    Coenzyme Q10 100 MG TABS [id number] Yes Take 100 mg by mouth daily. [provider]  Active Spouse/Significant Other  colchicine  0.6 MG tablet [id number] Yes For acute gout flare take 2  pills at once, then take a 3rd pill 2 hours later. Then take 1 pill daily for 7-10 days or until resolved. Edman Marsa PARAS, DO  Active Spouse/Significant Other           Med Note MALKA LYNWOOD HERO [name] 19, 2023  1:18 PM) > 1 month since last used  diclofenac sodium (VOLTAREN) 1 % GEL [id number] Yes Apply 2 g topically 4 (four) times daily as needed for pain. Foot pain [provider]  Active Spouse/Significant Other           Med Note MALKA LYNWOOD HERO   Thu May 19, 2023  1:18 PM) > 1 month since last used  fluticasone -salmeterol (WIXELA INHUB) 100-50 MCG/ACT AEPB [id number] Yes Inhale 1 puff into the lungs 2 (two) times daily. Isadora Hose, MD  Active   furosemide  (LASIX ) 80 MG tablet [id number] Yes Take 1 tablet (80 mg total) by mouth daily. May take 2nd dose AS NEEDED for fluid retention. Edman Marsa PARAS, DO  Active   ipratropium (ATROVENT ) 0.06 % nasal spray [id number] Yes Place 2 sprays into both nostrils 4 (four) times daily as needed for rhinitis. Karamalegos,  Marsa PARAS, DO  Active Spouse/Significant Other           Med Note MALKA LYNWOOD HERO   Thu May 19, 2023  1:19 PM) > 1 month since last used  levothyroxine  (SYNTHROID ) 125 MCG tablet [id number] Yes Take 1 tablet (125 mcg total) by mouth daily before breakfast. Edman Marsa PARAS, DO  Active   loratadine  (CLARITIN ) 10 MG tablet [id number] Yes Take 10 mg by mouth daily. [provider]  Active Spouse/Significant Other  meclizine  (ANTIVERT ) 12.5 MG tablet [id number] Yes Take 1 tablet (12.5 mg total) by mouth 3 (three) times daily as needed for dizziness. Edman Marsa PARAS, DO  Active Spouse/Significant Other           Med Note MALKA LYNWOOD HERO   Thu May 19, 2023  1:21 PM) > 1 month since last used  montelukast  (SINGULAIR ) 10 MG tablet [id number] Yes Take 1 tablet (10 mg total) by mouth at bedtime. Edman Marsa PARAS, DO  Active   Multiple Vitamin (MULTIVITAMIN) tablet [id number] Yes Take 1  tablet by mouth daily. [provider]  Active Spouse/Significant Other  PATADAY 0.1 % ophthalmic solution [id number] Yes Place 1 drop into both eyes daily as needed for allergies. [provider]  Active Spouse/Significant Other  sennosides-docusate sodium  (SENOKOT-S) 8.6-50 MG tablet [id number]  Take 2 tablets by mouth daily.  Patient not taking: Reported on 01/26/2024   [provider]  Active Spouse/Significant Other  sertraline  (ZOLOFT ) 50 MG tablet [id number] Yes Take 1 tablet (50 mg total) by mouth daily. Edman Marsa PARAS, DO  Active   sildenafil  (VIAGRA ) 50 MG tablet [id number]  Take 1 tablet (50 mg total) by mouth daily as needed for erectile dysfunction. Edman Marsa PARAS, DO  Active   traZODone  (DESYREL ) 50 MG tablet [id number] Yes Take 1 tablet (50 mg total) by mouth at bedtime. Edman Marsa PARAS, DO  Active             Recommendation:   PCP Follow-up Specialty provider follow-up *** Referral to: ***  Follow Up Plan:   Telephone follow up appointment date/time:  ***  Murray Angelena HUGHS [facility], [facility] Licensed Clinical Social Worker Direct Dial : [phone]

## 2024-02-02 NOTE — Patient Outreach (Signed)
 Complex Care Management   Visit Note  01/26/2024  Name:  Luke Mckee MRN: [id number] DOB: [birth year]  Situation: Referral received for Complex Care Management related to Mental/Behavioral Health diagnosis: Stress at home.  I obtained verbal consent from Patient.  Visit completed with Patient  on the phone  Background:   Past Medical History:  Diagnosis Date   Anxiety    Arthritis    CAD (coronary artery disease) 2005   s/p CABG, DES   CHF (congestive heart failure) (HCC)    in EPIC care everywhere   Chronic kidney disease    COPD (chronic obstructive pulmonary disease) (HCC)    in EPIC care everywhere   Depression    Heart murmur    History of stroke    Hypothyroidism    OSA (obstructive sleep apnea)    Paroxysmal A-fib (HCC)    in EPIC careeverywhere    Assessment: Phone call to Patient and his daughter to follow up on referral for caregiver stress. LCSW will work with Patient and family to provide support as well as referral to a behavioral health agency as well as support groups.  Patient Reported Symptoms:  Cognitive Cognitive Status: No symptoms reported Cognitive/Intellectual Conditions Management [RPT]: None reported or documented in medical history or problem list   Health Facilitated by: Rest, Prayer/meditation  Neurological Neurological Review of Symptoms: No symptoms reported    HEENT HEENT Symptoms Reported: No symptoms reported (dry eyes in rt eye use drops and its fine after that) HEENT Management Strategies: Medical device (one in chest -loop- battery stopped working -never had it removed - placed d/t heart attack and 2 minor strokes)  (weartwo hearing aides)  Cardiovascular Cardiovascular Symptoms Reported: No symptoms reported Does patient have uncontrolled Hypertension?: No (patient reports that it has been pretty stable although low)    Respiratory Respiratory Symptoms Reported: No symptoms reported    Endocrine Endocrine Symptoms Reported: No symptoms  reported Is patient diabetic?: No    Gastrointestinal Gastrointestinal Symptoms Reported: No symptoms reported      Genitourinary Genitourinary Symptoms Reported: No symptoms reported    Integumentary Integumentary Symptoms Reported: Bruising    Musculoskeletal Musculoskelatal Symptoms Reviewed: Other Additional Musculoskeletal Details: if my feet are hurting, im hurting but otherwise no        Psychosocial Psychosocial Symptoms Reported: Sadness - if selected complete PHQ 2-9, Irritability Additional Psychological Details: admits to some sadness and irritability at times Behavioral Management Strategies: Adequate rest, Medication therapy Major Change/Loss/Stressor/Fears (CP): Medical condition, family Techniques to [place] with Loss/Stress/Change: Diversional activities, Spiritual practice(s), Medication Quality of Family Relationships: helpful, involved, supportive Do you feel physically threatened by others?: No    02/02/2024    PHQ2-9 Depression Screening   Little interest or pleasure in doing things Several days  Feeling down, depressed, or hopeless Not at all  PHQ-2 - Total Score 1  Trouble falling or staying asleep, or sleeping too much Not at all (i sleep enough- doing ok - Cpap i use every night)  Feeling tired or having little energy Several days  Poor appetite or overeating  Not at all  Feeling bad about yourself - or that you are a failure or have let yourself or your family down Several days (when my wife gets on to me about not showing enough to her - then i go over and kiss her)  Trouble concentrating on things, such as reading the newspaper or watching television Not at all  Moving or speaking so slowly that other  people could have noticed.  Or the opposite - being so fidgety or restless that you have been moving around a lot more than usual Not at all  Thoughts that you would be better off dead, or hurting yourself in some way Not at all  PHQ2-9 Total Score 3  If  you checked off any problems, how difficult have these problems made it for you to do your work, take care of things at home, or get along with other people    Depression Interventions/Treatment      There were no vitals filed for this visit.    Medications Reviewed Today     Reviewed by Angelena Finis HERO, LCSW (Social Worker) on 01/26/24 at 1649  Med List Status: <None>   Medication Order Taking? Sig Documenting Provider Last Dose Status Informant  acetaminophen  (TYLENOL ) 650 MG CR tablet [id number] Yes Take 1,300 mg by mouth at bedtime as needed for pain. [provider]  Active Spouse/Significant Other  albuterol  (VENTOLIN  HFA) 108 (90 Base) MCG/ACT inhaler [id number] Yes Inhale 1-2 puffs into the lungs every 4 (four) hours as needed for wheezing or shortness of breath (cough). Edman Marsa PARAS, DO  Active   aspirin  EC 81 MG tablet [id number] Yes Take 81 mg by mouth daily. [provider]  Active Spouse/Significant Other  atenolol  (TENORMIN ) 25 MG tablet [id number] Yes Take 1 tablet (25 mg total) by mouth every morning. Edman Marsa PARAS, DO  Active   atorvastatin  (LIPITOR ) 80 MG tablet [id number] Yes Take 1 tablet (80 mg total) by mouth at bedtime. Edman Marsa PARAS, DO  Active   Calcium  Carb-Cholecalciferol (CALCIUM  600 + D PO) [id number] Yes Take 1 tablet by mouth daily. [provider]  Active Spouse/Significant Other  Cholecalciferol (VITAMIN D) 50 MCG (2000 UT) tablet [id number] Yes Take 2,000 Units by mouth daily. [provider]  Active Spouse/Significant Other  clopidogrel  (PLAVIX ) 75 MG tablet [id number] Yes Take 1 tablet (75 mg total) by mouth daily. Vivienne Lonni Ingle, NP  Active   clotrimazole -betamethasone  (LOTRISONE ) cream [id number] Yes Apply twice a day for 1-2 weeks, may repeat if need in future Edman Marsa PARAS, DO  Active Spouse/Significant Other           Med Note MALKA LYNWOOD HERO   Thu May 19, 2023  1:17  PM) > 1 month since last used    Coenzyme Q10 100 MG TABS [id number] Yes Take 100 mg by mouth daily. [provider]  Active Spouse/Significant Other  colchicine  0.6 MG tablet [id number] Yes For acute gout flare take 2 pills at once, then take a 3rd pill 2 hours later. Then take 1 pill daily for 7-10 days or until resolved. Edman Marsa PARAS, DO  Active Spouse/Significant Other           Med Note MALKA LYNWOOD HERO [name] 19, 2023  1:18 PM) > 1 month since last used  diclofenac sodium (VOLTAREN) 1 % GEL [id number] Yes Apply 2 g topically 4 (four) times daily as needed for pain. Foot pain [provider]  Active Spouse/Significant Other           Med Note MALKA LYNWOOD HERO   Thu May 19, 2023  1:18 PM) > 1 month since last used  fluticasone -salmeterol (WIXELA INHUB) 100-50 MCG/ACT AEPB [id number] Yes Inhale 1 puff into the lungs 2 (two) times daily. Isadora Hose, MD  Active   furosemide  (LASIX ) 80 MG tablet [id number] Yes Take 1 tablet (80 mg total)  by mouth daily. May take 2nd dose AS NEEDED for fluid retention. Edman Marsa PARAS, DO  Active   ipratropium (ATROVENT ) 0.06 % nasal spray [id number] Yes Place 2 sprays into both nostrils 4 (four) times daily as needed for rhinitis. Edman Marsa PARAS, DO  Active Spouse/Significant Other           Med Note MALKA LYNWOOD CHRISTELLA [name] 19, 2023  1:19 PM) > 1 month since last used  levothyroxine  (SYNTHROID ) 125 MCG tablet [id number] Yes Take 1 tablet (125 mcg total) by mouth daily before breakfast. Edman Marsa PARAS, DO  Active   loratadine  (CLARITIN ) 10 MG tablet [id number] Yes Take 10 mg by mouth daily. [provider]  Active Spouse/Significant Other  meclizine  (ANTIVERT ) 12.5 MG tablet [id number] Yes Take 1 tablet (12.5 mg total) by mouth 3 (three) times daily as needed for dizziness. Edman Marsa PARAS, DO  Active Spouse/Significant Other           Med Note MALKA LYNWOOD CHRISTELLA [name] 19, 2023  1:21  PM) > 1 month since last used  montelukast  (SINGULAIR ) 10 MG tablet [id number] Yes Take 1 tablet (10 mg total) by mouth at bedtime. Edman Marsa PARAS, DO  Active   Multiple Vitamin (MULTIVITAMIN) tablet [id number] Yes Take 1 tablet by mouth daily. [provider]  Active Spouse/Significant Other  PATADAY 0.1 % ophthalmic solution [id number] Yes Place 1 drop into both eyes daily as needed for allergies. [provider]  Active Spouse/Significant Other  sennosides-docusate sodium  (SENOKOT-S) 8.6-50 MG tablet [id number]  Take 2 tablets by mouth daily.  Patient not taking: Reported on 01/26/2024   [provider]  Active Spouse/Significant Other  sertraline  (ZOLOFT ) 50 MG tablet [id number] Yes Take 1 tablet (50 mg total) by mouth daily. Edman Marsa PARAS, DO  Active   sildenafil  (VIAGRA ) 50 MG tablet [id number]  Take 1 tablet (50 mg total) by mouth daily as needed for erectile dysfunction. Edman Marsa PARAS, DO  Active   traZODone  (DESYREL ) 50 MG tablet [id number] Yes Take 1 tablet (50 mg total) by mouth at bedtime. Edman Marsa PARAS, DO  Active             Recommendation:   PCP Follow-up Specialty provider follow-up as needed. Counseling or support group  Follow Up Plan:   Telephone follow up appointment date/time:  02/06/24/at 1:00 PM.  Murray Shawl, LCSW [facility], [facility] Licensed Clinical Social Worker Direct Dial : [phone]

## 2024-02-02 NOTE — Patient Instructions (Signed)
 Visit Information  Thank you for taking time to visit with me today. Please don't hesitate to contact me if I can be of assistance to you before our next scheduled appointment.  Our next appointment is by telephone on 02/06/24 at 1:00 PM Please call the care guide team at [phone] if you need to cancel or reschedule your appointment.   Following is a copy of your care plan:   Goals Addressed             This Visit's Progress    VBC[facility] Social Work Care Plan LCSW       Problems:   Support and education needs related to Stress at home  CSW Clinical Goal(s):   Over the next 30 days the Patient will review behavioral health agency or caregiver support group information provided as evidenced by Patient/family report at follow up phone call. Over the next 90 days or until connected to a counselor or until symptoms have improved, work with Child Psychotherapist to address concerns related to stress at home as evidenced by Patient/family report at follow up phone calls.   Interventions:  Mental Health:  Evaluation of current treatment plan related to Stress at home Active listening / Reflection utilized Emotional Support Provided Participation in counseling encouraged  Patient Goals/Self-Care Activities:  Call resources provided to follow up on caregiver resources and support groups.  Consider counseling services   Continue taking your medication as prescribed.    Plan:   Telephone follow up appointment with care management team member scheduled for:  02/06/24 at 1:00 PM.        Please call 1-800-273-TALK (toll free, 24 hour hotline) call 911 if you are experiencing a Mental Health or Behavioral Health Crisis or need someone to talk to.  Patient verbalized understanding of Care plan and visit instructions communicated this visit  Murray Shawl, LCSW [facility], [facility] Licensed Clinical Social Worker Direct Dial : [phone]

## 2024-02-06 ENCOUNTER — Other Ambulatory Visit: Payer: Self-pay

## 2024-02-06 NOTE — Patient Instructions (Signed)
 Visit Information  Thank you for taking time to visit with me today. Please don't hesitate to contact me if I can be of assistance to you before our next scheduled appointment.  Your next care management appointment is by telephone on 02/24/2024 at 10:00 am.  Please call the care guide team at [phone] if you need to cancel, schedule, or reschedule an appointment.   Please call 1-800-273-TALK (toll free, 24 hour hotline) call 911 if you are experiencing a Mental Health or Behavioral Health Crisis or need someone to talk to.  Murray Shawl, LCSW [facility], [facility] Licensed Clinical Social Worker Direct Dial : [phone]

## 2024-02-06 NOTE — Patient Outreach (Signed)
 Complex Care Management   Visit Note  02/06/2024  Name:  Luke Mckee MRN: [id number] DOB: 11/01/1940  Situation: Referral received for Complex Care Management related to Mental/Behavioral Health diagnosis Caregiver stress/Stress at home. I obtained verbal consent from Patient.  Visit completed with Patient  on the phone  Background:   Past Medical History:  Diagnosis Date   Anxiety    Arthritis    CAD (coronary artery disease) 2005   s/p CABG, DES   CHF (congestive heart failure) (HCC)    in EPIC care everywhere   Chronic kidney disease    COPD (chronic obstructive pulmonary disease) (HCC)    in EPIC care everywhere   Depression    Heart murmur    History of stroke    Hypothyroidism    OSA (obstructive sleep apnea)    Paroxysmal A-fib (HCC)    in EPIC careeverywhere    Assessment: Check in call to Patient. Spoke with both Patient and spouse. Both report an improvement in [name] mood and attribute the change to the sertraline . LCSW has scheduled a follow up call to check in again.  Patient Reported Symptoms:  Cognitive Cognitive Status: No symptoms reported Cognitive/Intellectual Conditions Management [RPT]: None reported or documented in medical history or problem list      Neurological Neurological Review of Symptoms: No symptoms reported    HEENT HEENT Symptoms Reported: No symptoms reported      Cardiovascular Cardiovascular Symptoms Reported: No symptoms reported    Respiratory Respiratory Symptoms Reported: No symptoms reported    Endocrine Endocrine Symptoms Reported: No symptoms reported    Gastrointestinal Gastrointestinal Symptoms Reported: No symptoms reported      Genitourinary Genitourinary Symptoms Reported: No symptoms reported    Integumentary Integumentary Symptoms Reported: Other (No new symptoms reported) Skin Management Strategies: Routine screening  Musculoskeletal Musculoskelatal Symptoms Reviewed: No symptoms reported         Psychosocial Psychosocial Symptoms Reported: No symptoms reported          02/06/2024    PHQ2-9 Depression Screening   Little interest or pleasure in doing things Not at all  Feeling down, depressed, or hopeless Not at all (Spouse reports a recent improvement in [name] mood and Patient agrees - they attribute the change to the sertraline .)  PHQ-2 - Total Score 0  Trouble falling or staying asleep, or sleeping too much    Feeling tired or having little energy    Poor appetite or overeating     Feeling bad about yourself - or that you are a failure or have let yourself or your family down    Trouble concentrating on things, such as reading the newspaper or watching television    Moving or speaking so slowly that other people could have noticed.  Or the opposite - being so fidgety or restless that you have been moving around a lot more than usual    Thoughts that you would be better off dead, or hurting yourself in some way    PHQ2-9 Total Score    If you checked off any problems, how difficult have these problems made it for you to do your work, take care of things at home, or get along with other people    Depression Interventions/Treatment      There were no vitals filed for this visit. Pain Scale: 0-10  Medications Reviewed Today     Reviewed by Angelena Finis HERO, LCSW (Social Worker) on 02/06/24 at 1327  Med List Status: <None>  Medication Order Taking? Sig Documenting Provider Last Dose Status Informant  acetaminophen  (TYLENOL ) 650 MG CR tablet [id number] Yes Take 1,300 mg by mouth at bedtime as needed for pain. [provider]  Active Spouse/Significant Other  albuterol  (VENTOLIN  HFA) 108 (90 Base) MCG/ACT inhaler [id number] Yes Inhale 1-2 puffs into the lungs every 4 (four) hours as needed for wheezing or shortness of breath (cough). Edman Marsa PARAS, DO  Active   aspirin  EC 81 MG tablet [id number] Yes Take 81 mg by mouth daily. [provider]   Active Spouse/Significant Other  atenolol  (TENORMIN ) 25 MG tablet [id number] Yes Take 1 tablet (25 mg total) by mouth every morning. Edman Marsa PARAS, DO  Active   atorvastatin  (LIPITOR ) 80 MG tablet [id number] Yes Take 1 tablet (80 mg total) by mouth at bedtime. Edman Marsa PARAS, DO  Active   Calcium  Carb-Cholecalciferol (CALCIUM  600 + D PO) [id number] Yes Take 1 tablet by mouth daily. [provider]  Active Spouse/Significant Other  Cholecalciferol (VITAMIN D) 50 MCG (2000 UT) tablet [id number] Yes Take 2,000 Units by mouth daily. [provider]  Active Spouse/Significant Other  clopidogrel  (PLAVIX ) 75 MG tablet [id number] Yes Take 1 tablet (75 mg total) by mouth daily. Vivienne Lonni Ingle, NP  Active   clotrimazole -betamethasone  (LOTRISONE ) cream [id number] Yes Apply twice a day for 1-2 weeks, may repeat if need in future Edman Marsa PARAS, DO  Active Spouse/Significant Other           Med Note MALKA LYNWOOD CHRISTELLA [name] 19, 2023  1:17 PM) > 1 month since last used    Coenzyme Q10 100 MG TABS [id number] Yes Take 100 mg by mouth daily. [provider]  Active Spouse/Significant Other  colchicine  0.6 MG tablet [id number] Yes For acute gout flare take 2 pills at once, then take a 3rd pill 2 hours later. Then take 1 pill daily for 7-10 days or until resolved. Edman Marsa PARAS, DO  Active Spouse/Significant Other           Med Note MALKA LYNWOOD CHRISTELLA [name] 19, 2023  1:18 PM) > 1 month since last used  diclofenac sodium (VOLTAREN) 1 % GEL [id number] Yes Apply 2 g topically 4 (four) times daily as needed for pain. Foot pain [provider]  Active Spouse/Significant Other           Med Note MALKA LYNWOOD CHRISTELLA   Thu May 19, 2023  1:18 PM) > 1 month since last used  fluticasone -salmeterol (WIXELA INHUB) 100-50 MCG/ACT AEPB [id number] Yes Inhale 1 puff into the lungs 2 (two) times daily. Isadora Hose, MD  Active   furosemide  (LASIX ) 80 MG  tablet [id number] Yes Take 1 tablet (80 mg total) by mouth daily. May take 2nd dose AS NEEDED for fluid retention. Edman Marsa PARAS, DO  Active   ipratropium (ATROVENT ) 0.06 % nasal spray [id number] Yes Place 2 sprays into both nostrils 4 (four) times daily as needed for rhinitis. Edman Marsa PARAS, DO  Active Spouse/Significant Other           Med Note MALKA LYNWOOD CHRISTELLA [name] 19, 2023  1:19 PM) > 1 month since last used  levothyroxine  (SYNTHROID ) 125 MCG tablet [id number] Yes Take 1 tablet (125 mcg total) by mouth daily before breakfast. Edman Marsa PARAS, DO  Active   loratadine  (CLARITIN ) 10 MG tablet [id number] Yes Take 10 mg by mouth daily. [provider]  Active Spouse/Significant Other  meclizine  (ANTIVERT )  12.5 MG tablet [id number] Yes Take 1 tablet (12.5 mg total) by mouth 3 (three) times daily as needed for dizziness. Edman Marsa PARAS, DO  Active Spouse/Significant Other           Med Note MALKA LYNWOOD HERO   Thu May 19, 2023  1:21 PM) > 1 month since last used  montelukast  (SINGULAIR ) 10 MG tablet [id number] Yes Take 1 tablet (10 mg total) by mouth at bedtime. Edman Marsa PARAS, DO  Active   Multiple Vitamin (MULTIVITAMIN) tablet [id number] Yes Take 1 tablet by mouth daily. [provider]  Active Spouse/Significant Other  PATADAY 0.1 % ophthalmic solution [id number] Yes Place 1 drop into both eyes daily as needed for allergies. [provider]  Active Spouse/Significant Other  sennosides-docusate sodium  (SENOKOT-S) 8.6-50 MG tablet [id number]  Take 2 tablets by mouth daily.  Patient not taking: Reported on 02/06/2024   [provider]  Active Spouse/Significant Other  sertraline  (ZOLOFT ) 50 MG tablet [id number] Yes Take 1 tablet (50 mg total) by mouth daily. Edman Marsa PARAS, DO  Active   sildenafil  (VIAGRA ) 50 MG tablet [id number] Yes Take 1 tablet (50 mg total) by mouth daily as needed for erectile dysfunction.  Edman Marsa PARAS, DO  Active   traZODone  (DESYREL ) 50 MG tablet [id number] Yes Take 1 tablet (50 mg total) by mouth at bedtime. Edman Marsa PARAS, DO  Active             Recommendation:   PCP Follow-up Specialty provider follow-up as needed Continue Current Plan of Care Counseling   Follow Up Plan:   Telephone follow up appointment date/time:  02/24/2024 at 10:00 am  Murray Shawl, LCSW [facility], [facility] Licensed Clinical Social Worker Direct Dial : [phone]

## 2024-02-07 ENCOUNTER — Other Ambulatory Visit (HOSPITAL_COMMUNITY): Payer: Self-pay

## 2024-02-08 ENCOUNTER — Other Ambulatory Visit: Payer: Self-pay

## 2024-02-08 NOTE — Patient Outreach (Signed)
 Complex Care Management   Visit Note  02/08/2024  Name:  Luke Mckee MRN: [id number] DOB: 01/20/1940  Situation: Referral received for Complex Care Management related to Heart Failure I obtained verbal consent from Patient.  Visit completed with Patient  on the phone  Background:   Past Medical History:  Diagnosis Date   Anxiety    Arthritis    CAD (coronary artery disease) 2005   s/p CABG, DES   CHF (congestive heart failure) (HCC)    in EPIC care everywhere   Chronic kidney disease    COPD (chronic obstructive pulmonary disease) (HCC)    in EPIC care everywhere   Depression    Heart murmur    History of stroke    Hypothyroidism    OSA (obstructive sleep apnea)    Paroxysmal A-fib (HCC)    in EPIC careeverywhere    Assessment: Patient Reported Symptoms:  Cognitive Cognitive Status: No symptoms reported Cognitive/Intellectual Conditions Management [RPT]: None reported or documented in medical history or problem list   Health Maintenance Behaviors: Sleep adequate Healing Pattern: Average  Neurological Neurological Review of Symptoms: No symptoms reported Neurological Management Strategies: Medication therapy, Weight management Neurological Self-Management Outcome: 4 (good)  HEENT HEENT Symptoms Reported: No symptoms reported HEENT Management Strategies: Medical device HEENT Self-Management Outcome: 4 (good) HEENT Comment: bipap    Cardiovascular   Cardiovascular Management Strategies: Adequate rest Do You Have a Working Readable Scale?: Yes Weight: 220 lb (99.8 kg) Cardiovascular Self-Management Outcome: 4 (good)  Respiratory   Respiratory Management Strategies: Routine screening, CPAP Respiratory Self-Management Outcome: 4 (good)  Endocrine Endocrine Symptoms Reported: No symptoms reported Is patient diabetic?: No Endocrine Self-Management Outcome: 4 (good)  Gastrointestinal Gastrointestinal Symptoms Reported: No symptoms reported Gastrointestinal Management  Strategies: Medication therapy Gastrointestinal Self-Management Outcome: 4 (good) Gastrointestinal Comment: stool softener Nutrition Risk Screen (CP): No indicators present  Genitourinary Genitourinary Symptoms Reported: No symptoms reported Genitourinary Management Strategies: Activity Genitourinary Self-Management Outcome: 4 (good)  Integumentary Integumentary Symptoms Reported: Bruising Other Integumentary Symptoms: mild bruisisng Skin Management Strategies: Routine screening Skin Self-Management Outcome: 4 (good)  Musculoskeletal Musculoskelatal Symptoms Reviewed: No symptoms reported Musculoskeletal Management Strategies: Medication therapy, Weight management Musculoskeletal Self-Management Outcome: 4 (good) Falls in the past year?: No Number of falls in past year: 1 or less Was there an injury with Fall?: No Fall Risk Category Calculator: 0 Patient Fall Risk Level: Low Fall Risk Patient at Risk for Falls Due to: Impaired mobility, Impaired balance/gait Fall risk Follow up: Falls evaluation completed, Education provided, Falls prevention discussed  Psychosocial Psychosocial Symptoms Reported: No symptoms reported Additional Psychological Details: reports he feels better on the zoloft  Behavioral Management Strategies: Adequate rest, Medication therapy Behavioral Health Self-Management Outcome: 4 (good) Major Change/Loss/Stressor/Fears (CP): Medical condition, family Behaviors When Feeling Stressed/Fearful: reports they are taking a week vacation with daughter and grandaughter Techniques to [employer] with Loss/Stress/Change: Spiritual practice(s), Counseling, Diversional activities Quality of Family Relationships: helpful, involved Do you feel physically threatened by others?: No    02/08/2024    PHQ2-9 Depression Screening   Little interest or pleasure in doing things Not at all  Feeling down, depressed, or hopeless Not at all  PHQ-2 - Total Score 0  Trouble falling or staying  asleep, or sleeping too much Not at all  Feeling tired or having little energy Several days  Poor appetite or overeating  Not at all  Feeling bad about yourself - or that you are a failure or have let yourself or your family down Not at  all  Trouble concentrating on things, such as reading the newspaper or watching television Several days  Moving or speaking so slowly that other people could have noticed.  Or the opposite - being so fidgety or restless that you have been moving around a lot more than usual Not at all  Thoughts that you would be better off dead, or hurting yourself in some way Not at all  PHQ2-9 Total Score 2  If you checked off any problems, how difficult have these problems made it for you to do your work, take care of things at home, or get along with other people    Depression Interventions/Treatment      Today's Vitals   02/08/24 1109  Weight: 220 lb (99.8 kg)   Pain Scale: 0-10 Pain Score: 0-No pain  Medications Reviewed Today     Reviewed by Nivia Chad , RN (Registered Nurse) on 02/08/24 at 1107  Med List Status: <None>   Medication Order Taking? Sig Documenting Provider Last Dose Status Informant  acetaminophen  (TYLENOL ) 650 MG CR tablet [id number] Yes Take 1,300 mg by mouth at bedtime as needed for pain. [provider]  Active Spouse/Significant Other  albuterol  (VENTOLIN  HFA) 108 (90 Base) MCG/ACT inhaler [id number] Yes Inhale 1-2 puffs into the lungs every 4 (four) hours as needed for wheezing or shortness of breath (cough). Edman Marsa PARAS, DO  Active   aspirin  EC 81 MG tablet [id number] Yes Take 81 mg by mouth daily. [provider]  Active Spouse/Significant Other  atenolol  (TENORMIN ) 25 MG tablet [id number] Yes Take 1 tablet (25 mg total) by mouth every morning. Edman Marsa PARAS, DO  Active   atorvastatin  (LIPITOR ) 80 MG tablet [id number] Yes Take 1 tablet (80 mg total) by mouth at bedtime. Edman Marsa PARAS, DO   Active   Calcium  Carb-Cholecalciferol (CALCIUM  600 + D PO) [id number] Yes Take 1 tablet by mouth daily. [provider]  Active Spouse/Significant Other  Cholecalciferol (VITAMIN D) 50 MCG (2000 UT) tablet [id number] Yes Take 2,000 Units by mouth daily. [provider]  Active Spouse/Significant Other  clopidogrel  (PLAVIX ) 75 MG tablet [id number] Yes Take 1 tablet (75 mg total) by mouth daily. Vivienne Lonni Ingle, NP  Active   clotrimazole -betamethasone  (LOTRISONE ) cream [id number] Yes Apply twice a day for 1-2 weeks, may repeat if need in future Edman Marsa PARAS, DO  Active Spouse/Significant Other           Med Note MALKA LYNWOOD CHRISTELLA [name] 19, 2023  1:17 PM) > 1 month since last used    Coenzyme Q10 100 MG TABS [id number] Yes Take 100 mg by mouth daily. [provider]  Active Spouse/Significant Other  colchicine  0.6 MG tablet [id number] Yes For acute gout flare take 2 pills at once, then take a 3rd pill 2 hours later. Then take 1 pill daily for 7-10 days or until resolved. Edman Marsa PARAS, DO  Active Spouse/Significant Other           Med Note MALKA LYNWOOD CHRISTELLA [name] 19, 2023  1:18 PM) > 1 month since last used  diclofenac sodium (VOLTAREN) 1 % GEL [id number] Yes Apply 2 g topically 4 (four) times daily as needed for pain. Foot pain [provider]  Active Spouse/Significant Other           Med Note MALKA LYNWOOD CHRISTELLA   Thu May 19, 2023  1:18 PM) > 1 month since last used  fluticasone -salmeterol (WIXELA INHUB) 100-50 MCG/ACT  AEPB [id number] Yes Inhale 1 puff into the lungs 2 (two) times daily. Isadora Hose, MD  Active   furosemide  (LASIX ) 80 MG tablet [id number] Yes Take 1 tablet (80 mg total) by mouth daily. May take 2nd dose AS NEEDED for fluid retention. Edman Marsa PARAS, DO  Active   ipratropium (ATROVENT ) 0.06 % nasal spray [id number] Yes Place 2 sprays into both nostrils 4 (four) times daily as needed for rhinitis. Edman Marsa PARAS, DO  Active Spouse/Significant Other           Med Note MALKA LYNWOOD CHRISTELLA [name] 19, 2023  1:19 PM) > 1 month since last used  levothyroxine  (SYNTHROID ) 125 MCG tablet [id number] Yes Take 1 tablet (125 mcg total) by mouth daily before breakfast. Edman Marsa PARAS, DO  Active   loratadine  (CLARITIN ) 10 MG tablet [id number] Yes Take 10 mg by mouth daily. [provider]  Active Spouse/Significant Other  meclizine  (ANTIVERT ) 12.5 MG tablet [id number] Yes Take 1 tablet (12.5 mg total) by mouth 3 (three) times daily as needed for dizziness. Edman Marsa PARAS, DO  Active Spouse/Significant Other           Med Note MALKA LYNWOOD CHRISTELLA   Thu May 19, 2023  1:21 PM) > 1 month since last used  montelukast  (SINGULAIR ) 10 MG tablet [id number] Yes Take 1 tablet (10 mg total) by mouth at bedtime. Edman Marsa PARAS, DO  Active   Multiple Vitamin (MULTIVITAMIN) tablet [id number] Yes Take 1 tablet by mouth daily. [provider]  Active Spouse/Significant Other  PATADAY 0.1 % ophthalmic solution [id number] Yes Place 1 drop into both eyes daily as needed for allergies. [provider]  Active Spouse/Significant Other  sennosides-docusate sodium  (SENOKOT-S) 8.6-50 MG tablet [id number] Yes Take 2 tablets by mouth daily. [provider]  Active Spouse/Significant Other  sertraline  (ZOLOFT ) 50 MG tablet [id number] Yes Take 1 tablet (50 mg total) by mouth daily. Edman Marsa PARAS, DO  Active   sildenafil  (VIAGRA ) 50 MG tablet [id number] Yes Take 1 tablet (50 mg total) by mouth daily as needed for erectile dysfunction. Edman Marsa PARAS, DO  Active   traZODone  (DESYREL ) 50 MG tablet [id number] Yes Take 1 tablet (50 mg total) by mouth at bedtime. Edman Marsa PARAS, DO  Active             Recommendation:   Continue Current Plan of Care  Follow Up Plan:   Telephone follow-up in 1 month  Shaden Higley RN RN Care Manager [facility] [phone]

## 2024-02-08 NOTE — Patient Instructions (Signed)
 Visit Information  Thank you for taking time to visit with me today. Please don't hesitate to contact me if I can be of assistance to you before our next scheduled appointment.  Your next care management appointment is by telephone on 03/07/24 at 10:00am  Telephone follow-up in 1 month  Please call the care guide team at [phone] if you need to cancel, schedule, or reschedule an appointment.   Please call the Suicide and Crisis Lifeline: 988 call the [place]  National Suicide Prevention Lifeline: (332)685-8822 or TTY: 501 579 6241 TTY 917-171-3223) to talk to a trained counselor call 1-800-273-TALK (toll free, 24 hour hotline) if you are experiencing a Mental Health or Behavioral Health Crisis or need someone to talk to.  Kelee Cunningham RN RN Care Manager [facility] [phone]

## 2024-02-13 ENCOUNTER — Other Ambulatory Visit: Payer: Self-pay | Admitting: Family Medicine

## 2024-02-13 DIAGNOSIS — F419 Anxiety disorder, unspecified: Secondary | ICD-10-CM

## 2024-02-15 ENCOUNTER — Encounter: Payer: Self-pay | Admitting: Student in an Organized Health Care Education/Training Program

## 2024-02-15 ENCOUNTER — Ambulatory Visit: Admitting: Student in an Organized Health Care Education/Training Program

## 2024-02-15 ENCOUNTER — Ambulatory Visit

## 2024-02-15 VITALS — BP 116/70 | HR 70 | Temp 98.0°F | Ht 68.0 in | Wt 225.0 lb

## 2024-02-15 DIAGNOSIS — J849 Interstitial pulmonary disease, unspecified: Secondary | ICD-10-CM | POA: Diagnosis not present

## 2024-02-15 DIAGNOSIS — I5030 Unspecified diastolic (congestive) heart failure: Secondary | ICD-10-CM

## 2024-02-15 DIAGNOSIS — J4520 Mild intermittent asthma, uncomplicated: Secondary | ICD-10-CM | POA: Diagnosis not present

## 2024-02-15 DIAGNOSIS — G4733 Obstructive sleep apnea (adult) (pediatric): Secondary | ICD-10-CM | POA: Diagnosis not present

## 2024-02-15 LAB — PULMONARY FUNCTION TEST
FEF 25-75 Post: 4.27 L/s
FEF 25-75 Pre: 3.88 L/s
FEF2575-%Change-Post: 10 %
FEF2575-%Pred-Post: 252 %
FEF2575-%Pred-Pre: 229 %
FEV1-%Change-Post: 2 %
FEV1-%Pred-Post: 98 %
FEV1-%Pred-Pre: 95 %
FEV1-Post: 2.49 L
FEV1-Pre: 2.42 L
FEV1FVC-%Change-Post: 2 %
FEV1FVC-%Pred-Pre: 125 %
FEV6-%Change-Post: 0 %
FEV6-%Pred-Post: 81 %
FEV6-%Pred-Pre: 80 %
FEV6-Post: 2.73 L
FEV6-Pre: 2.71 L
FEV6FVC-%Pred-Post: 107 %
FEV6FVC-%Pred-Pre: 107 %
FVC-%Change-Post: 0 %
FVC-%Pred-Post: 75 %
FVC-%Pred-Pre: 75 %
FVC-Post: 2.73 L
FVC-Pre: 2.71 L
Post FEV1/FVC ratio: 91 %
Post FEV6/FVC ratio: 100 %
Pre FEV1/FVC ratio: 89 %
Pre FEV6/FVC Ratio: 100 %
RV % pred: 71 %
RV: 1.84 L
TLC % pred: 83 %
TLC: 5.55 L

## 2024-02-15 NOTE — Progress Notes (Signed)
 Full PFT completed today ? ?

## 2024-02-15 NOTE — Patient Instructions (Signed)
 Full PFT completed today ? ?

## 2024-02-15 NOTE — Telephone Encounter (Signed)
 Requested Prescriptions  Pending Prescriptions Disp Refills   sertraline  (ZOLOFT ) 50 MG tablet [Pharmacy Med Name: SERTRALINE  50MG  TABLETS] 90 tablet 0    Sig: TAKE 1 TABLET(50 MG) BY MOUTH DAILY     Psychiatry:  Antidepressants - SSRI - sertraline  Passed - 02/15/2024 10:53 AM      Passed - AST in normal range and within 360 days    AST  Date Value Ref Range Status  01/09/2024 16 10 - 35 U/L Final         Passed - ALT in normal range and within 360 days    ALT  Date Value Ref Range Status  01/09/2024 13 9 - 46 U/L Final         Passed - Completed PHQ-2 or PHQ-9 in the last 360 days      Passed - Valid encounter within last 6 months    Recent Outpatient Visits           4 weeks ago Annual physical exam   [place] [facility] [place], Marsa PARAS, DO   7 months ago History of cerebrovascular accident (CVA) with residual deficit   [place] [facility] [place], Marsa PARAS, DO   10 months ago Coronary artery disease involving native coronary artery of native heart with angina pectoris   [facility] [place], Marsa PARAS, [place]

## 2024-02-15 NOTE — Progress Notes (Unsigned)
 " Assessment & Plan  #Interstitial lung disease   #Mild Intermittent Asthma #OSA on CPAP #HFpEF #CAD s/p PCI  Mild pulmonary fibrosis with a likely UIP pattern is noted on CT scan. Differential diagnosis includes pulmonary fibrosis with autoimmune features, hypersensitivity pneumonitis, pneumoconiosis, asbestos exposure, reflux disease, and idiopathic pulmonary fibrosis. No smoking history reported, but he does have a history of exposures (hay, multiple exposures in the [employer]).  Pulmonary function tests today show no reversibility, normal FEV1, mildly reduced FVC, and total lung capacity of 83% of predicted (previous measurement was at 74% predicted). Previous DLCO was at 66% predicted.  We did initiate ICS/LABA given reversibility on his initial spirometry, raising suspicion for underlying asthma. His symptoms have improved with this, and reversibility is no longer notable on spirometry. Given how mild the fibrosis appears on his CT, this might have been due to a prior exposure rather than active IPF or ILD. Plan to continue his current inhaler regimen, and continue to closely monitor lung function with repeat PFT's in the future. Should his FVC/TLC/DLCo worsen, will consider repeating his CT scan as well as consider initiation of anti-fibrotic medications with any sign of disease progression.   - Pulmonary Function Test; Future - Continue Wixela one puff twice daily   Return in about 6 months (around 08/14/2024).  Belva November, MD [facility]  I spent 32 minutes caring for this patient today, including preparing to see the patient, obtaining a medical history , reviewing a separately obtained history, performing a medically appropriate examination and/or evaluation, counseling and educating the patient/family/caregiver, ordering medications, tests, or procedures, documenting clinical information in the electronic health record, and independently interpreting results  (not separately reported/billed) and communicating results to the patient/family/caregiver  End of visit medications:  No orders of the defined types were placed in this encounter.   Current Medications[1]   Subjective:   PATIENT ID: Luke Mckee GENDER: male DOB: 1940-09-20, MRN: [id number]  Chief Complaint  Patient presents with   Interstitial Lung Disease    Shortness of breath on exertion. No cough or wheezing.     Discussed the use of AI scribe software for clinical note transcription with the patient, who gave verbal consent to proceed.  History of Present Illness  Luke Mckee is an 84 year old male with mild pulmonary fibrosis who presents for follow-up.  Initial Visit 06/20/2023:   Patient is referred to us  by cardiology given symptoms of shortness of breath and exertional dyspnea.  He denies having any cough or wheezing.  He does not have any chest pain or chest tightness, nor does he report any fevers, chills, night sweats, or symptoms of systemic illness.  Patient does endorse some symptoms of seasonal allergies.  He has an albuterol  inhaler but he has not been using it much.   Patient follows closely with his primary care physician where he is reported to have a history of CVA with residual right-sided deficit (left MCA territory), hypertension, and CAD status post CABG (x 3) as well as stent placement.  He has been followed by cardiology where an echocardiogram from January 2025 showed normal ejection fraction with mild, grade 1, diastolic dysfunction.  His RV function was noted to be normal as were his valves.  Given persistent symptoms, cardiac PET was ordered which was mildly abnormal with mild ischemia in the inferior wall.  He underwent both right and left heart caths on 05/19/2023 showing multivessel disease.  Patient had balloon angioplasty to in-stent restenosis  within the vein graft to OM2 with improvement.  His right heart cath showed a wedge pressure of 12 with  an associated LVEDP of 20.   Return Visit 07/27/2023:   He reports improvement in his shortness of breath since his cardiac cath (with balloon angioplasty, noted to have LVEDP of 20), but this persists. He's had his PFT's which showed some bronchial reactivity as well as some findings suggestive of restriction. He continues to be compliant with his CPAP machine.   Return Visit 11/07/2023:   He has been using his inhaler regularly, taking it upon waking and before bed, which allows him to rinse his mouth afterward. His breathing has improved, with an estimated five to six out of ten improvement. His oxygen levels remain at 100% with CPAP use at night but drop to 95% if he removes it due to disturbances, such as his dog getting up.   No recent respiratory illnesses, worsening cough, infections, colds, or pneumonias since July. He experiences a runny nose upon waking, which clears as the day progresses, and notes that wearing his CPAP mask helps dry his nose.   CT scan showed mild pulmonary fibrosis with scarring at the bases of both lungs and he is presenting today to discuss results. A prior breathing test showed reduced lung volumes and diffusion capacity. He has a history of exposure to hay in his youth and possible asbestos exposure during his [employer] service in the [employer].  Return Visit 02/15/2024:  Since the last visit, he has undergone blood work which showed a very mild ANA at 1:40 without specific antibodies, and the rest of the autoimmune workup was negative. A swallowing study with double contrast esophagogram showed mild esophageal dysmotility but no overt reflux or aspiration. A CT scan in mid-October revealed a mild pulmonary pancreas pattern, probable for UIP. He has been using his inhalers daily, specifically at night, and his breathing has improved with the inhaler.   Repeat pulmonary function tests (PFTs) today show no reversibility, with normal FEV1 and mildly reduced FVC. His total  lung capacity is at 83% of predicted, as compared to 74% in July.  He is currently using the Wixela inhaler every night. He has been exercising more and has lost weight, going from 246 pounds to 220 pounds. He is also undergoing physical therapy for balance issues, which he attributes to an old foot injury. His daughter has taken over managing his medications due to his mother's cognitive decline.  He has received his pneumonia vaccine in December 2024, and is current on his COVID and flu vaccinations. He uses a CPAP machine and reports generally good readings, although he experienced a poor reading recently due to a leak in the water tank.   Patient denies any smoking history.  He previously served in [employer] for 6 years (ages [age]) then worked as a copywriter, advertising for [employer].  He then served in [employer] from ages 48 until 41 before retiring.  During his [employer], he was exposed to jet fuels and fumes. He does report potential exposure to asbestos during his service with the [employer].  He also reports some significant secondhand smoke exposure.   Ancillary information including prior medications, full medical/surgical/family/social histories, and PFTs (when available) are listed below and have been reviewed.    Review of Systems  Constitutional:  Negative for chills, fever and weight loss.  Respiratory:  Negative for cough, hemoptysis, sputum production, shortness of breath and wheezing.   Cardiovascular:  Negative for chest pain.     Objective:   Vitals:   02/15/24 1037  BP: 116/70  Pulse: 70  Temp: 98 F (36.7 C)  TempSrc: Temporal  SpO2: 98%  Weight: 225 lb (102.1 kg)  Height: 5' 8 (1.727 m)   98% on RA  BMI Readings from Last 3 Encounters:  02/15/24 34.21 kg/m  02/15/24 34.21 kg/m  02/08/24 33.45 kg/m   Wt Readings from Last 3 Encounters:  02/15/24 225 lb (102.1 kg)  02/15/24 225 lb (102.1 kg)  02/08/24 220 lb (99.8 kg)     .vitalsmbmi  Physical Exam Constitutional:      Appearance: He is obese. He is not ill-appearing.  Cardiovascular:     Rate and Rhythm: Normal rate and regular rhythm.     Pulses: Normal pulses.     Heart sounds: Normal heart sounds.  Pulmonary:     Effort: Pulmonary effort is normal.     Breath sounds: Rales (mild bibasilar rales) present.  Neurological:     General: No focal deficit present.     Mental Status: He is alert and oriented to person, place, and time. Mental status is at baseline.     Ancillary Information    Past Medical History:  Diagnosis Date   Anxiety    Arthritis    CAD (coronary artery disease) 2005   s/p CABG, DES   CHF (congestive heart failure) (HCC)    in EPIC care everywhere   Chronic kidney disease    COPD (chronic obstructive pulmonary disease) (HCC)    in EPIC care everywhere   Depression    Heart murmur    History of stroke    Hypothyroidism    OSA (obstructive sleep apnea)    Paroxysmal A-fib (HCC)    in EPIC careeverywhere     Family History  Problem Relation Age of Onset   Anxiety disorder Sister      Past Surgical History:  Procedure Laterality Date   APPENDECTOMY     BUNIONECTOMY     CATARACT EXTRACTION     Right eye   CATARACT EXTRACTION W/PHACO Left 01/19/2021   Procedure: CATARACT EXTRACTION PHACO AND INTRAOCULAR LENS PLACEMENT (IOC) LEFT 3.09 00:28.3;  Surgeon: Myrna Adine Anes, MD;  Location: [facility];  Service: Ophthalmology;  Laterality: Left;   COLONOSCOPY  07/06/2004   CORONARY ARTERY BYPASS GRAFT  2005   CORONARY BALLOON ANGIOPLASTY N/A 05/19/2023   Procedure: CORONARY BALLOON ANGIOPLASTY;  Surgeon: Anner Alm ORN, MD;  Location: [facility];  Service: Cardiovascular;  Laterality: N/A;   LOOP RECORDER INSERTION N/A 07/25/2018   Procedure: LOOP RECORDER INSERTION;  Surgeon: Inocencio Soyla Lunger, MD;  Location: [facility];  Service: Cardiovascular;  Laterality: N/A;   RIGHT/LEFT  HEART CATH AND CORONARY ANGIOGRAPHY Bilateral 05/19/2023   Procedure: RIGHT/LEFT HEART CATH AND CORONARY ANGIOGRAPHY;  Surgeon: Anner Alm ORN, MD;  Location: [facility];  Service: Cardiovascular;  Laterality: Bilateral;   TOTAL HIP ARTHROPLASTY Right 11/23/2011   TOTAL HIP ARTHROPLASTY Left 09/07/2011   TOTAL KNEE ARTHROPLASTY Right 02/15/2018   VASECTOMY  1978    Social History   Socioeconomic History   Marital status: Married    Spouse name: Not on file   Number of children: Not on file   Years of education: Not on file   Highest education level: Not on file  Occupational History   Occupation: retired  Tobacco Use   Smoking status: Never   Smokeless tobacco:  Never  Vaping Use   Vaping status: Never Used  Substance and Sexual Activity   Alcohol use: Not Currently    Comment: past   Drug use: Never   Sexual activity: Not on file  Other Topics Concern   Not on file  Social History Narrative   Not on file   Social Drivers of Health   Tobacco Use: Low Risk (02/15/2024)   Patient History    Smoking Tobacco Use: Never    Smokeless Tobacco Use: Never    Passive Exposure: Not on file  Financial Resource Strain: Low Risk (01/26/2024)   Overall Financial Resource Strain (CARDIA)    Difficulty of Paying Living Expenses: Not hard at all  Food Insecurity: No Food Insecurity (01/26/2024)   Epic    Worried About Programme Researcher, Broadcasting/film/video in the Last Year: Never true    Ran Out of Food in the Last Year: Never true  Transportation Needs: No Transportation Needs (01/26/2024)   Epic    Lack of Transportation (Medical): No    Lack of Transportation (Non-Medical): No  Physical Activity: Insufficiently Active (11/04/2023)   Exercise Vital Sign    Days of Exercise per Week: 3 days    Minutes of Exercise per Session: 30 min  Stress: No Stress Concern Present (11/04/2023)   [employer] of Occupational Health - Occupational Stress Questionnaire    Feeling of Stress: Not at all   Social Connections: Moderately Integrated (11/04/2023)   Social Connection and Isolation Panel    Frequency of Communication with Friends and Family: More than three times a week    Frequency of Social Gatherings with Friends and Family: Three times a week    Attends Religious Services: More than 4 times per year    Active Member of Clubs or Organizations: Patient declined    Attends Banker Meetings: Never    Marital Status: Married  Catering Manager Violence: Not At Risk (01/26/2024)   Epic    Fear of Current or Ex-Partner: No    Emotionally Abused: No    Physically Abused: No    Sexually Abused: No  Depression (PHQ2-9): Low Risk (02/08/2024)   Depression (PHQ2-9)    PHQ-2 Score: 2  Alcohol Screen: Low Risk (11/04/2023)   Alcohol Screen    Last Alcohol Screening Score (AUDIT): 0  Housing: Low Risk (01/26/2024)   Epic    Unable to Pay for Housing in the Last Year: No    Number of Times Moved in the Last Year: 0    Homeless in the Last Year: No  Utilities: Not At Risk (11/04/2023)   Epic    Threatened with loss of utilities: No  Health Literacy: Adequate Health Literacy (11/04/2023)   B1300 Health Literacy    Frequency of need for help with medical instructions: Never     Allergies[2]   CBC    Component Value Date/Time   WBC 5.8 01/09/2024 0935   RBC 4.18 (L) 01/09/2024 0935   HGB 13.1 (L) 01/09/2024 0935   HGB 13.5 05/17/2023 1203   HCT 40.7 01/09/2024 0935   HCT 41.6 05/17/2023 1203   PLT 241 01/09/2024 0935   PLT 287 05/17/2023 1203   MCV 97.4 01/09/2024 0935   MCV 97 05/17/2023 1203   MCH 31.3 01/09/2024 0935   MCHC 32.2 01/09/2024 0935   RDW 13.3 01/09/2024 0935   RDW 13.1 05/17/2023 1203   LYMPHSABS 1.7 07/29/2022 1408   MONOABS 0.8 07/29/2022 1408   EOSABS 319 01/09/2024  0935   BASOSABS 58 01/09/2024 0935    Pulmonary Functions Testing Results:    Latest Ref Rng & Units 02/15/2024    9:59 AM 07/27/2023   10:51 AM  PFT Results  FVC-Pre L  2.71  2.62   FVC-Predicted Pre % 75  72   FVC-Post L 2.73  2.82   FVC-Predicted Post % 75  78   Pre FEV1/FVC % % 89  84   Post FEV1/FCV % % 91  87   FEV1-Pre L 2.42  2.19   FEV1-Predicted Pre % 95  86   FEV1-Post L 2.49  2.44   DLCO uncorrected ml/min/mmHg  14.89   DLCO UNC% %  66   DLVA Predicted %  91   TLC L 5.55  4.96   TLC % Predicted % 83  74   RV % Predicted % 71  89     Outpatient Medications Prior to Visit  Medication Sig Dispense Refill   acetaminophen  (TYLENOL ) 650 MG CR tablet Take 1,300 mg by mouth at bedtime as needed for pain.     albuterol  (VENTOLIN  HFA) 108 (90 Base) MCG/ACT inhaler Inhale 1-2 puffs into the lungs every 4 (four) hours as needed for wheezing or shortness of breath (cough). 1 each 3   aspirin  EC 81 MG tablet Take 81 mg by mouth daily.     atenolol  (TENORMIN ) 25 MG tablet Take 1 tablet (25 mg total) by mouth every morning. 90 tablet 1   atorvastatin  (LIPITOR ) 80 MG tablet Take 1 tablet (80 mg total) by mouth at bedtime. 90 tablet 1   Calcium  Carb-Cholecalciferol (CALCIUM  600 + D PO) Take 1 tablet by mouth daily.     Cholecalciferol (VITAMIN D) 50 MCG (2000 UT) tablet Take 2,000 Units by mouth daily.     clopidogrel  (PLAVIX ) 75 MG tablet Take 1 tablet (75 mg total) by mouth daily. 90 tablet 3   clotrimazole -betamethasone  (LOTRISONE ) cream Apply twice a day for 1-2 weeks, may repeat if need in future 30 g 1   Coenzyme Q10 100 MG TABS Take 100 mg by mouth daily.     colchicine  0.6 MG tablet For acute gout flare take 2 pills at once, then take a 3rd pill 2 hours later. Then take 1 pill daily for 7-10 days or until resolved. 30 tablet 2   diclofenac sodium (VOLTAREN) 1 % GEL Apply 2 g topically 4 (four) times daily as needed for pain. Foot pain     fluticasone -salmeterol (WIXELA INHUB) 100-50 MCG/ACT AEPB Inhale 1 puff into the lungs 2 (two) times daily. 60 each 12   furosemide  (LASIX ) 80 MG tablet Take 1 tablet (80 mg total) by mouth daily. May take 2nd dose  AS NEEDED for fluid retention. 90 tablet 1   ipratropium (ATROVENT ) 0.06 % nasal spray Place 2 sprays into both nostrils 4 (four) times daily as needed for rhinitis. 15 mL 2   levothyroxine  (SYNTHROID ) 125 MCG tablet Take 1 tablet (125 mcg total) by mouth daily before breakfast. 90 tablet 1   loratadine  (CLARITIN ) 10 MG tablet Take 10 mg by mouth daily.     meclizine  (ANTIVERT ) 12.5 MG tablet Take 1 tablet (12.5 mg total) by mouth 3 (three) times daily as needed for dizziness. 30 tablet 1   montelukast  (SINGULAIR ) 10 MG tablet Take 1 tablet (10 mg total) by mouth at bedtime. 90 tablet 1   Multiple Vitamin (MULTIVITAMIN) tablet Take 1 tablet by mouth daily.     PATADAY 0.1 %  ophthalmic solution Place 1 drop into both eyes daily as needed for allergies.     sennosides-docusate sodium  (SENOKOT-S) 8.6-50 MG tablet Take 2 tablets by mouth daily.     sildenafil  (VIAGRA ) 50 MG tablet Take 1 tablet (50 mg total) by mouth daily as needed for erectile dysfunction. 30 tablet 2   traZODone  (DESYREL ) 50 MG tablet Take 1 tablet (50 mg total) by mouth at bedtime. 90 tablet 1   sertraline  (ZOLOFT ) 50 MG tablet Take 1 tablet (50 mg total) by mouth daily. 30 tablet 0   No facility-administered medications prior to visit.      [1]  Current Outpatient Medications:    acetaminophen  (TYLENOL ) 650 MG CR tablet, Take 1,300 mg by mouth at bedtime as needed for pain., Disp: , Rfl:    albuterol  (VENTOLIN  HFA) 108 (90 Base) MCG/ACT inhaler, Inhale 1-2 puffs into the lungs every 4 (four) hours as needed for wheezing or shortness of breath (cough)., Disp: 1 each, Rfl: 3   aspirin  EC 81 MG tablet, Take 81 mg by mouth daily., Disp: , Rfl:    atenolol  (TENORMIN ) 25 MG tablet, Take 1 tablet (25 mg total) by mouth every morning., Disp: 90 tablet, Rfl: 1   atorvastatin  (LIPITOR ) 80 MG tablet, Take 1 tablet (80 mg total) by mouth at bedtime., Disp: 90 tablet, Rfl: 1   Calcium  Carb-Cholecalciferol (CALCIUM  600 + D PO), Take 1  tablet by mouth daily., Disp: , Rfl:    Cholecalciferol (VITAMIN D) 50 MCG (2000 UT) tablet, Take 2,000 Units by mouth daily., Disp: , Rfl:    clopidogrel  (PLAVIX ) 75 MG tablet, Take 1 tablet (75 mg total) by mouth daily., Disp: 90 tablet, Rfl: 3   clotrimazole -betamethasone  (LOTRISONE ) cream, Apply twice a day for 1-2 weeks, may repeat if need in future, Disp: 30 g, Rfl: 1   Coenzyme Q10 100 MG TABS, Take 100 mg by mouth daily., Disp: , Rfl:    colchicine  0.6 MG tablet, For acute gout flare take 2 pills at once, then take a 3rd pill 2 hours later. Then take 1 pill daily for 7-10 days or until resolved., Disp: 30 tablet, Rfl: 2   diclofenac sodium (VOLTAREN) 1 % GEL, Apply 2 g topically 4 (four) times daily as needed for pain. Foot pain, Disp: , Rfl:    fluticasone -salmeterol (WIXELA INHUB) 100-50 MCG/ACT AEPB, Inhale 1 puff into the lungs 2 (two) times daily., Disp: 60 each, Rfl: 12   furosemide  (LASIX ) 80 MG tablet, Take 1 tablet (80 mg total) by mouth daily. May take 2nd dose AS NEEDED for fluid retention., Disp: 90 tablet, Rfl: 1   ipratropium (ATROVENT ) 0.06 % nasal spray, Place 2 sprays into both nostrils 4 (four) times daily as needed for rhinitis., Disp: 15 mL, Rfl: 2   levothyroxine  (SYNTHROID ) 125 MCG tablet, Take 1 tablet (125 mcg total) by mouth daily before breakfast., Disp: 90 tablet, Rfl: 1   loratadine  (CLARITIN ) 10 MG tablet, Take 10 mg by mouth daily., Disp: , Rfl:    meclizine  (ANTIVERT ) 12.5 MG tablet, Take 1 tablet (12.5 mg total) by mouth 3 (three) times daily as needed for dizziness., Disp: 30 tablet, Rfl: 1   montelukast  (SINGULAIR ) 10 MG tablet, Take 1 tablet (10 mg total) by mouth at bedtime., Disp: 90 tablet, Rfl: 1   Multiple Vitamin (MULTIVITAMIN) tablet, Take 1 tablet by mouth daily., Disp: , Rfl:    PATADAY 0.1 % ophthalmic solution, Place 1 drop into both eyes daily as needed for  allergies., Disp: , Rfl:    sennosides-docusate sodium  (SENOKOT-S) 8.6-50 MG tablet, Take  2 tablets by mouth daily., Disp: , Rfl:    sildenafil  (VIAGRA ) 50 MG tablet, Take 1 tablet (50 mg total) by mouth daily as needed for erectile dysfunction., Disp: 30 tablet, Rfl: 2   traZODone  (DESYREL ) 50 MG tablet, Take 1 tablet (50 mg total) by mouth at bedtime., Disp: 90 tablet, Rfl: 1   sertraline  (ZOLOFT ) 50 MG tablet, TAKE 1 TABLET(50 MG) BY MOUTH DAILY, Disp: 90 tablet, Rfl: 0 [2]  Allergies Allergen Reactions   Bee Pollen Cough   Pollen Extract Cough   Ace Inhibitors    Cephalexin     Vomiting and diarrhea   Crestor [Rosuvastatin Calcium ] Other (See Comments)    myalgia   Tape Rash    blistering   "

## 2024-02-15 NOTE — Patient Instructions (Signed)
" °  VISIT SUMMARY: During your follow-up visit, we reviewed your recent test results and discussed your current treatment plan for mild pulmonary fibrosis. Your recent blood work, swallowing study, and CT scan results were reviewed. Your pulmonary function tests showed stable results with some improvement in total lung capacity. We also discussed your current inhaler use, weight loss, and physical therapy for balance issues.  YOUR PLAN: -INTERSTITIAL LUNG DISEASE: You have mild pulmonary fibrosis, which is a condition where the lung tissue becomes scarred over time. Your recent CT scan showed a pattern likely consistent with usual interstitial pneumonia (UIP). Your pulmonary function tests showed stable results with some improvement in total lung capacity. You should continue using your Wixela inhaler every night. We will repeat spirometry with total lung capacity measurement in six months. If your pulmonary function tests worsen, we may consider another CT scan and discuss potential antifibrotic medication.  INSTRUCTIONS: Please continue using your Wixela inhaler every night and follow your current exercise and physical therapy regimen. We will repeat your spirometry with total lung capacity measurement in six months. If you notice any worsening of your symptoms, please contact our office immediately.    Contains text generated by [employer].   "

## 2024-02-24 ENCOUNTER — Telehealth

## 2024-03-07 ENCOUNTER — Telehealth

## 2024-04-17 ENCOUNTER — Ambulatory Visit: Admitting: Family Medicine

## 2024-11-09 ENCOUNTER — Ambulatory Visit
